# Patient Record
Sex: Female | Born: 1990
Health system: Southern US, Community
[De-identification: ages and names within clinical notes are randomized; demographics above are authoritative.]

## PROBLEM LIST (undated history)

## (undated) DIAGNOSIS — F1990 Other psychoactive substance use, unspecified, uncomplicated: Secondary | ICD-10-CM

## (undated) DIAGNOSIS — F1910 Other psychoactive substance abuse, uncomplicated: Secondary | ICD-10-CM

## (undated) DIAGNOSIS — Z978 Presence of other specified devices: Secondary | ICD-10-CM

## (undated) DIAGNOSIS — Z720 Tobacco use: Secondary | ICD-10-CM

## (undated) DIAGNOSIS — I509 Heart failure, unspecified: Secondary | ICD-10-CM

## (undated) DIAGNOSIS — I82409 Acute embolism and thrombosis of unspecified deep veins of unspecified lower extremity: Secondary | ICD-10-CM

## (undated) DIAGNOSIS — R0682 Tachypnea, not elsewhere classified: Secondary | ICD-10-CM

## (undated) DIAGNOSIS — I6310 Cerebral infarction due to embolism of unspecified precerebral artery: Secondary | ICD-10-CM

## (undated) DIAGNOSIS — I330 Acute and subacute infective endocarditis: Secondary | ICD-10-CM

## (undated) DIAGNOSIS — Z452 Encounter for adjustment and management of vascular access device: Secondary | ICD-10-CM

## (undated) HISTORY — DX: Cerebral infarction due to embolism of unspecified precerebral artery: I63.10

## (undated) HISTORY — DX: Acute and subacute infective endocarditis: I33.0

## (undated) HISTORY — DX: Acute embolism and thrombosis of unspecified deep veins of unspecified lower extremity: I82.409

## (undated) HISTORY — DX: Encounter for adjustment and management of vascular access device: Z45.2

## (undated) HISTORY — DX: Tachypnea, not elsewhere classified: R06.82

## (undated) HISTORY — DX: Presence of other specified devices: Z97.8

---

## 2019-04-01 ENCOUNTER — Inpatient Hospital Stay (HOSPITAL_COMMUNITY)
Admission: EM | Admit: 2019-04-01 | Discharge: 2019-06-09 | DRG: 907 | Disposition: A | Discharge status: Home / Self Care | Payer: Medicaid Other | Attending: Student | Admitting: Student

## 2019-04-01 ENCOUNTER — Emergency Department (HOSPITAL_COMMUNITY): Payer: Medicaid Other | Admitting: Certified Registered Nurse Anesthetist

## 2019-04-01 ENCOUNTER — Inpatient Hospital Stay (HOSPITAL_COMMUNITY): Payer: Medicaid Other

## 2019-04-01 ENCOUNTER — Emergency Department (HOSPITAL_COMMUNITY): Payer: Medicaid Other

## 2019-04-01 ENCOUNTER — Encounter (HOSPITAL_COMMUNITY)
Admission: EM | Disposition: A | Discharge status: Home / Self Care | Payer: Self-pay | Source: Home / Self Care | Attending: Cardiothoracic Surgery

## 2019-04-01 ENCOUNTER — Encounter (HOSPITAL_COMMUNITY): Payer: Self-pay

## 2019-04-01 DIAGNOSIS — I82411 Acute embolism and thrombosis of right femoral vein: Secondary | ICD-10-CM | POA: Diagnosis not present

## 2019-04-01 DIAGNOSIS — R29810 Facial weakness: Secondary | ICD-10-CM | POA: Diagnosis present

## 2019-04-01 DIAGNOSIS — I82409 Acute embolism and thrombosis of unspecified deep veins of unspecified lower extremity: Secondary | ICD-10-CM

## 2019-04-01 DIAGNOSIS — I671 Cerebral aneurysm, nonruptured: Secondary | ICD-10-CM | POA: Diagnosis present

## 2019-04-01 DIAGNOSIS — N280 Ischemia and infarction of kidney: Secondary | ICD-10-CM | POA: Diagnosis present

## 2019-04-01 DIAGNOSIS — F19239 Other psychoactive substance dependence with withdrawal, unspecified: Secondary | ICD-10-CM | POA: Diagnosis not present

## 2019-04-01 DIAGNOSIS — I339 Acute and subacute endocarditis, unspecified: Secondary | ICD-10-CM

## 2019-04-01 DIAGNOSIS — D62 Acute posthemorrhagic anemia: Secondary | ICD-10-CM | POA: Diagnosis not present

## 2019-04-01 DIAGNOSIS — I76 Septic arterial embolism: Secondary | ICD-10-CM | POA: Diagnosis present

## 2019-04-01 DIAGNOSIS — B259 Cytomegaloviral disease, unspecified: Secondary | ICD-10-CM | POA: Diagnosis not present

## 2019-04-01 DIAGNOSIS — Z95828 Presence of other vascular implants and grafts: Secondary | ICD-10-CM

## 2019-04-01 DIAGNOSIS — I63512 Cerebral infarction due to unspecified occlusion or stenosis of left middle cerebral artery: Secondary | ICD-10-CM

## 2019-04-01 DIAGNOSIS — Z9689 Presence of other specified functional implants: Secondary | ICD-10-CM

## 2019-04-01 DIAGNOSIS — G4700 Insomnia, unspecified: Secondary | ICD-10-CM | POA: Diagnosis not present

## 2019-04-01 DIAGNOSIS — E538 Deficiency of other specified B group vitamins: Secondary | ICD-10-CM | POA: Diagnosis present

## 2019-04-01 DIAGNOSIS — B1920 Unspecified viral hepatitis C without hepatic coma: Secondary | ICD-10-CM | POA: Diagnosis present

## 2019-04-01 DIAGNOSIS — I38 Endocarditis, valve unspecified: Secondary | ICD-10-CM | POA: Diagnosis present

## 2019-04-01 DIAGNOSIS — G8191 Hemiplegia, unspecified affecting right dominant side: Secondary | ICD-10-CM | POA: Diagnosis present

## 2019-04-01 DIAGNOSIS — R579 Shock, unspecified: Secondary | ICD-10-CM | POA: Diagnosis not present

## 2019-04-01 DIAGNOSIS — T401X1A Poisoning by heroin, accidental (unintentional), initial encounter: Secondary | ICD-10-CM | POA: Diagnosis present

## 2019-04-01 DIAGNOSIS — K5900 Constipation, unspecified: Secondary | ICD-10-CM | POA: Diagnosis not present

## 2019-04-01 DIAGNOSIS — Z978 Presence of other specified devices: Secondary | ICD-10-CM

## 2019-04-01 DIAGNOSIS — R0682 Tachypnea, not elsewhere classified: Secondary | ICD-10-CM

## 2019-04-01 DIAGNOSIS — F17210 Nicotine dependence, cigarettes, uncomplicated: Secondary | ICD-10-CM | POA: Diagnosis present

## 2019-04-01 DIAGNOSIS — E785 Hyperlipidemia, unspecified: Secondary | ICD-10-CM | POA: Diagnosis present

## 2019-04-01 DIAGNOSIS — I6602 Occlusion and stenosis of left middle cerebral artery: Secondary | ICD-10-CM

## 2019-04-01 DIAGNOSIS — R5381 Other malaise: Secondary | ICD-10-CM | POA: Diagnosis not present

## 2019-04-01 DIAGNOSIS — J9600 Acute respiratory failure, unspecified whether with hypoxia or hypercapnia: Secondary | ICD-10-CM

## 2019-04-01 DIAGNOSIS — E871 Hypo-osmolality and hyponatremia: Secondary | ICD-10-CM | POA: Diagnosis not present

## 2019-04-01 DIAGNOSIS — F1210 Cannabis abuse, uncomplicated: Secondary | ICD-10-CM | POA: Diagnosis present

## 2019-04-01 DIAGNOSIS — I63412 Cerebral infarction due to embolism of left middle cerebral artery: Secondary | ICD-10-CM | POA: Diagnosis present

## 2019-04-01 DIAGNOSIS — Z01810 Encounter for preprocedural cardiovascular examination: Secondary | ICD-10-CM

## 2019-04-01 DIAGNOSIS — J95821 Acute postprocedural respiratory failure: Secondary | ICD-10-CM | POA: Diagnosis not present

## 2019-04-01 DIAGNOSIS — I330 Acute and subacute infective endocarditis: Secondary | ICD-10-CM | POA: Diagnosis present

## 2019-04-01 DIAGNOSIS — J9601 Acute respiratory failure with hypoxia: Secondary | ICD-10-CM

## 2019-04-01 DIAGNOSIS — R471 Dysarthria and anarthria: Secondary | ICD-10-CM | POA: Diagnosis not present

## 2019-04-01 DIAGNOSIS — Z20822 Contact with and (suspected) exposure to covid-19: Secondary | ICD-10-CM | POA: Diagnosis present

## 2019-04-01 DIAGNOSIS — Z09 Encounter for follow-up examination after completed treatment for conditions other than malignant neoplasm: Secondary | ICD-10-CM

## 2019-04-01 DIAGNOSIS — R4701 Aphasia: Secondary | ICD-10-CM | POA: Diagnosis present

## 2019-04-01 DIAGNOSIS — I110 Hypertensive heart disease with heart failure: Secondary | ICD-10-CM | POA: Diagnosis present

## 2019-04-01 DIAGNOSIS — I6310 Cerebral infarction due to embolism of unspecified precerebral artery: Secondary | ICD-10-CM | POA: Diagnosis present

## 2019-04-01 DIAGNOSIS — Z952 Presence of prosthetic heart valve: Secondary | ICD-10-CM

## 2019-04-01 DIAGNOSIS — I5040 Unspecified combined systolic (congestive) and diastolic (congestive) heart failure: Secondary | ICD-10-CM | POA: Diagnosis present

## 2019-04-01 DIAGNOSIS — I97638 Postprocedural hematoma of a circulatory system organ or structure following other circulatory system procedure: Secondary | ICD-10-CM | POA: Diagnosis not present

## 2019-04-01 DIAGNOSIS — I5020 Unspecified systolic (congestive) heart failure: Secondary | ICD-10-CM

## 2019-04-01 DIAGNOSIS — I639 Cerebral infarction, unspecified: Secondary | ICD-10-CM

## 2019-04-01 DIAGNOSIS — R000 Tachycardia, unspecified: Secondary | ICD-10-CM | POA: Diagnosis not present

## 2019-04-01 DIAGNOSIS — R1310 Dysphagia, unspecified: Secondary | ICD-10-CM | POA: Diagnosis present

## 2019-04-01 DIAGNOSIS — Y838 Other surgical procedures as the cause of abnormal reaction of the patient, or of later complication, without mention of misadventure at the time of the procedure: Secondary | ICD-10-CM | POA: Diagnosis not present

## 2019-04-01 DIAGNOSIS — Z452 Encounter for adjustment and management of vascular access device: Secondary | ICD-10-CM

## 2019-04-01 DIAGNOSIS — J90 Pleural effusion, not elsewhere classified: Secondary | ICD-10-CM

## 2019-04-01 DIAGNOSIS — I7771 Dissection of carotid artery: Secondary | ICD-10-CM | POA: Diagnosis present

## 2019-04-01 DIAGNOSIS — F1910 Other psychoactive substance abuse, uncomplicated: Secondary | ICD-10-CM | POA: Diagnosis present

## 2019-04-01 DIAGNOSIS — E876 Hypokalemia: Secondary | ICD-10-CM | POA: Diagnosis not present

## 2019-04-01 DIAGNOSIS — I39 Endocarditis and heart valve disorders in diseases classified elsewhere: Secondary | ICD-10-CM

## 2019-04-01 DIAGNOSIS — F1990 Other psychoactive substance use, unspecified, uncomplicated: Secondary | ICD-10-CM | POA: Diagnosis present

## 2019-04-01 DIAGNOSIS — D649 Anemia, unspecified: Secondary | ICD-10-CM | POA: Diagnosis present

## 2019-04-01 DIAGNOSIS — R0600 Dyspnea, unspecified: Secondary | ICD-10-CM

## 2019-04-01 DIAGNOSIS — F410 Panic disorder [episodic paroxysmal anxiety] without agoraphobia: Secondary | ICD-10-CM | POA: Diagnosis present

## 2019-04-01 DIAGNOSIS — Z4659 Encounter for fitting and adjustment of other gastrointestinal appliance and device: Secondary | ICD-10-CM

## 2019-04-01 DIAGNOSIS — R7303 Prediabetes: Secondary | ICD-10-CM | POA: Diagnosis not present

## 2019-04-01 DIAGNOSIS — R29716 NIHSS score 16: Secondary | ICD-10-CM | POA: Diagnosis present

## 2019-04-01 DIAGNOSIS — R0602 Shortness of breath: Secondary | ICD-10-CM

## 2019-04-01 DIAGNOSIS — D735 Infarction of spleen: Secondary | ICD-10-CM | POA: Diagnosis present

## 2019-04-01 DIAGNOSIS — B379 Candidiasis, unspecified: Secondary | ICD-10-CM | POA: Diagnosis not present

## 2019-04-01 DIAGNOSIS — Z4682 Encounter for fitting and adjustment of non-vascular catheter: Secondary | ICD-10-CM

## 2019-04-01 HISTORY — PX: RADIOLOGY WITH ANESTHESIA: SHX6223

## 2019-04-01 HISTORY — DX: Occlusion and stenosis of left middle cerebral artery: I66.02

## 2019-04-01 HISTORY — DX: Other psychoactive substance use, unspecified, uncomplicated: F19.90

## 2019-04-01 HISTORY — DX: Tobacco use: Z72.0

## 2019-04-01 HISTORY — PX: IR CT HEAD LTD: IMG2386

## 2019-04-01 HISTORY — PX: IR INTRAVSC STENT CERV CAROTID W/O EMB-PROT MOD SED INC ANGIO: IMG2304

## 2019-04-01 HISTORY — DX: Other psychoactive substance abuse, uncomplicated: F19.10

## 2019-04-01 HISTORY — PX: IR PERCUTANEOUS ART THROMBECTOMY/INFUSION INTRACRANIAL INC DIAG ANGIO: IMG6087

## 2019-04-01 HISTORY — DX: Cerebral infarction due to unspecified occlusion or stenosis of left middle cerebral artery: I63.512

## 2019-04-01 LAB — PROTIME-INR
INR: 1.5 — ABNORMAL HIGH (ref 0.8–1.2)
Prothrombin Time: 17.9 seconds — ABNORMAL HIGH (ref 11.4–15.2)

## 2019-04-01 LAB — COMPREHENSIVE METABOLIC PANEL
ALT: 40 U/L (ref 0–44)
AST: 63 U/L — ABNORMAL HIGH (ref 15–41)
Albumin: 2.3 g/dL — ABNORMAL LOW (ref 3.5–5.0)
Alkaline Phosphatase: 112 U/L (ref 38–126)
Anion gap: 12 (ref 5–15)
BUN: 6 mg/dL (ref 6–20)
CO2: 24 mmol/L (ref 22–32)
Calcium: 8.5 mg/dL — ABNORMAL LOW (ref 8.9–10.3)
Chloride: 93 mmol/L — ABNORMAL LOW (ref 98–111)
Creatinine, Ser: 0.76 mg/dL (ref 0.44–1.00)
GFR calc Af Amer: >60 mL/min (ref >60)
GFR calc non Af Amer: >60 mL/min (ref >60)
Glucose, Bld: 118 mg/dL — ABNORMAL HIGH (ref 70–99)
Potassium: 4.5 mmol/L (ref 3.5–5.1)
Sodium: 129 mmol/L — ABNORMAL LOW (ref 135–145)
Total Bilirubin: 0.9 mg/dL (ref 0.3–1.2)
Total Protein: 7 g/dL (ref 6.5–8.1)

## 2019-04-01 LAB — I-STAT CHEM 8, ED
BUN: 9 mg/dL (ref 6–20)
Calcium, Ion: 1 mmol/L — ABNORMAL LOW (ref 1.15–1.40)
Chloride: 92 mmol/L — ABNORMAL LOW (ref 98–111)
Creatinine, Ser: 0.7 mg/dL (ref 0.44–1.00)
Glucose, Bld: 118 mg/dL — ABNORMAL HIGH (ref 70–99)
HCT: 37 % (ref 36.0–46.0)
Hemoglobin: 12.6 g/dL (ref 12.0–15.0)
Potassium: 4 mmol/L (ref 3.5–5.1)
Sodium: 127 mmol/L — ABNORMAL LOW (ref 135–145)
TCO2: 30 mmol/L (ref 22–32)

## 2019-04-01 LAB — APTT: aPTT: 31 seconds (ref 24–36)

## 2019-04-01 LAB — CBC
HCT: 35.3 % — ABNORMAL LOW (ref 36.0–46.0)
Hemoglobin: 11.9 g/dL — ABNORMAL LOW (ref 12.0–15.0)
MCH: 25.4 pg — ABNORMAL LOW (ref 26.0–34.0)
MCHC: 33.7 g/dL (ref 30.0–36.0)
MCV: 75.4 fL — ABNORMAL LOW (ref 80.0–100.0)
Platelets: 285 10*3/uL (ref 150–400)
RBC: 4.68 MIL/uL (ref 3.87–5.11)
RDW: 14 % (ref 11.5–15.5)
WBC: 19.5 10*3/uL — ABNORMAL HIGH (ref 4.0–10.5)
nRBC: 0 % (ref 0.0–0.2)

## 2019-04-01 LAB — DIFFERENTIAL
Abs Immature Granulocytes: 0.33 10*3/uL — ABNORMAL HIGH (ref 0.00–0.07)
Basophils Absolute: 0.1 10*3/uL (ref 0.0–0.1)
Basophils Relative: 0 %
Eosinophils Absolute: 0 10*3/uL (ref 0.0–0.5)
Eosinophils Relative: 0 %
Immature Granulocytes: 2 %
Lymphocytes Relative: 5 %
Lymphs Abs: 1 10*3/uL (ref 0.7–4.0)
Monocytes Absolute: 1.1 10*3/uL — ABNORMAL HIGH (ref 0.1–1.0)
Monocytes Relative: 5 %
Neutro Abs: 17 10*3/uL — ABNORMAL HIGH (ref 1.7–7.7)
Neutrophils Relative %: 88 %

## 2019-04-01 LAB — I-STAT BETA HCG BLOOD, ED (MC, WL, AP ONLY): I-stat hCG, quantitative: 7.4 m[IU]/mL — ABNORMAL HIGH (ref <5)

## 2019-04-01 LAB — RESPIRATORY PANEL BY RT PCR (FLU A&B, COVID)
Influenza A by PCR: NEGATIVE
Influenza B by PCR: NEGATIVE
SARS Coronavirus 2 by RT PCR: NEGATIVE

## 2019-04-01 LAB — CBG MONITORING, ED: Glucose-Capillary: 124 mg/dL — ABNORMAL HIGH (ref 70–99)

## 2019-04-01 SURGERY — IR WITH ANESTHESIA
Anesthesia: General

## 2019-04-01 MED ORDER — EPTIFIBATIDE 20 MG/10ML IV SOLN
INTRAVENOUS | Status: AC
  Filled 2019-04-01: qty 10

## 2019-04-01 MED ORDER — ASPIRIN 81 MG PO CHEW
81.0000 mg | CHEWABLE_TABLET | Freq: Every day | ORAL | Status: DC
  Administered 2019-04-02 – 2019-04-04 (×3): 81 mg
  Filled 2019-04-01 (×4): qty 1

## 2019-04-01 MED ORDER — SODIUM CHLORIDE 0.9% FLUSH
3.0000 mL | Freq: Once | INTRAVENOUS | Status: DC
Start: 2019-04-01 — End: 2019-04-10

## 2019-04-01 MED ORDER — ASPIRIN 81 MG PO CHEW
CHEWABLE_TABLET | ORAL | Status: AC
  Filled 2019-04-01: qty 1

## 2019-04-01 MED ORDER — SODIUM CHLORIDE 0.9 % IV SOLN: INTRAVENOUS | Status: DC

## 2019-04-01 MED ORDER — ATORVASTATIN CALCIUM 80 MG PO TABS: 80.0000 mg | ORAL_TABLET | Freq: Every day | ORAL | Status: DC

## 2019-04-01 MED ORDER — SUCCINYLCHOLINE CHLORIDE 200 MG/10ML IV SOSY
PREFILLED_SYRINGE | INTRAVENOUS | Status: DC | PRN
  Administered 2019-04-01: 120 mg via INTRAVENOUS

## 2019-04-01 MED ORDER — FENTANYL CITRATE (PF) 100 MCG/2ML IJ SOLN
INTRAMUSCULAR | Status: AC
  Filled 2019-04-01: qty 2

## 2019-04-01 MED ORDER — VERAPAMIL HCL 2.5 MG/ML IV SOLN
INTRAVENOUS | Status: AC | PRN
  Administered 2019-04-01: 2.5 mg via INTRA_ARTERIAL

## 2019-04-01 MED ORDER — STROKE: EARLY STAGES OF RECOVERY BOOK
Freq: Once | Status: AC
  Filled 2019-04-01: qty 1

## 2019-04-01 MED ORDER — ASPIRIN 81 MG PO CHEW
CHEWABLE_TABLET | ORAL | Status: AC | PRN
  Administered 2019-04-01: 81 mg

## 2019-04-01 MED ORDER — TICAGRELOR 90 MG PO TABS
ORAL_TABLET | ORAL | Status: AC
  Filled 2019-04-01: qty 2

## 2019-04-01 MED ORDER — ACETAMINOPHEN 325 MG PO TABS
650.0000 mg | ORAL_TABLET | ORAL | Status: DC | PRN
  Administered 2019-04-08 – 2019-04-19 (×5): 650 mg via ORAL
  Filled 2019-04-01 (×5): qty 2

## 2019-04-01 MED ORDER — ROCURONIUM BROMIDE 50 MG/5ML IV SOSY
PREFILLED_SYRINGE | INTRAVENOUS | Status: DC | PRN
  Administered 2019-04-01: 70 mg via INTRAVENOUS

## 2019-04-01 MED ORDER — ACETAMINOPHEN 325 MG PO TABS: 650.0000 mg | ORAL_TABLET | ORAL | Status: DC | PRN

## 2019-04-01 MED ORDER — PROPOFOL 10 MG/ML IV BOLUS
INTRAVENOUS | Status: DC | PRN
  Administered 2019-04-01: 200 mg via INTRAVENOUS

## 2019-04-01 MED ORDER — CLEVIDIPINE BUTYRATE 0.5 MG/ML IV EMUL
0.0000 mg/h | INTRAVENOUS | Status: DC
  Administered 2019-04-02: 2 mg/h via INTRAVENOUS
  Filled 2019-04-01: qty 50

## 2019-04-01 MED ORDER — NITROGLYCERIN 1 MG/10 ML FOR IR/CATH LAB
INTRA_ARTERIAL | Status: AC | PRN
  Administered 2019-04-01 (×2): 25 ug via INTRA_ARTERIAL

## 2019-04-01 MED ORDER — SENNOSIDES-DOCUSATE SODIUM 8.6-50 MG PO TABS: 1.0000 | ORAL_TABLET | Freq: Every evening | ORAL | Status: DC | PRN

## 2019-04-01 MED ORDER — ACETAMINOPHEN 650 MG RE SUPP: 650.0000 mg | RECTAL | Status: DC | PRN

## 2019-04-01 MED ORDER — TICAGRELOR 60 MG PO TABS
ORAL_TABLET | ORAL | Status: AC | PRN
  Administered 2019-04-01: 180 mg

## 2019-04-01 MED ORDER — TICAGRELOR 90 MG PO TABS
90.0000 mg | ORAL_TABLET | Freq: Two times a day (BID) | ORAL | Status: DC
  Administered 2019-04-02 – 2019-04-03 (×4): 90 mg
  Filled 2019-04-01 (×5): qty 1

## 2019-04-01 MED ORDER — ACETAMINOPHEN 160 MG/5ML PO SOLN
650.0000 mg | ORAL | Status: DC | PRN
  Filled 2019-04-01: qty 20.3

## 2019-04-01 MED ORDER — ONDANSETRON HCL 4 MG/2ML IJ SOLN
4.0000 mg | Freq: Four times a day (QID) | INTRAMUSCULAR | Status: DC | PRN
  Administered 2019-04-16: 4 mg via INTRAVENOUS
  Filled 2019-04-01: qty 2

## 2019-04-01 MED ORDER — ACETAMINOPHEN 160 MG/5ML PO SOLN: 650.0000 mg | ORAL | Status: DC | PRN

## 2019-04-01 MED ORDER — PHENYLEPHRINE HCL-NACL 10-0.9 MG/250ML-% IV SOLN
INTRAVENOUS | Status: DC | PRN
  Administered 2019-04-01: 40 ug/min via INTRAVENOUS

## 2019-04-01 MED ORDER — CEFAZOLIN SODIUM-DEXTROSE 2-4 GM/100ML-% IV SOLN
INTRAVENOUS | Status: AC
  Filled 2019-04-01: qty 100

## 2019-04-01 MED ORDER — CEFAZOLIN SODIUM-DEXTROSE 2-3 GM-%(50ML) IV SOLR
INTRAVENOUS | Status: DC | PRN
  Administered 2019-04-01: 2 g via INTRAVENOUS

## 2019-04-01 MED ORDER — IOHEXOL 300 MG/ML  SOLN
150.0000 mL | Freq: Once | INTRAMUSCULAR | Status: AC | PRN
  Administered 2019-04-01: 60 mL via INTRA_ARTERIAL

## 2019-04-01 MED ORDER — NITROGLYCERIN 1 MG/10 ML FOR IR/CATH LAB
INTRA_ARTERIAL | Status: AC
  Filled 2019-04-01: qty 10

## 2019-04-01 MED ORDER — TICAGRELOR 90 MG PO TABS: 90.0000 mg | ORAL_TABLET | Freq: Two times a day (BID) | ORAL | Status: DC

## 2019-04-01 MED ORDER — EPTIFIBATIDE 20 MG/10ML IV SOLN
INTRAVENOUS | Status: AC | PRN
  Administered 2019-04-01 (×3): 1.5 mg via INTRAVENOUS

## 2019-04-01 MED ORDER — ACETAMINOPHEN 650 MG RE SUPP
650.0000 mg | RECTAL | Status: DC | PRN
  Administered 2019-04-02: 650 mg via RECTAL
  Filled 2019-04-01: qty 1

## 2019-04-01 MED ORDER — LIDOCAINE 2% (20 MG/ML) 5 ML SYRINGE
INTRAMUSCULAR | Status: DC | PRN
  Administered 2019-04-01: 60 mg via INTRAVENOUS

## 2019-04-01 MED ORDER — VERAPAMIL HCL 2.5 MG/ML IV SOLN
INTRAVENOUS | Status: AC
  Filled 2019-04-01: qty 2

## 2019-04-01 MED ORDER — ASPIRIN 81 MG PO CHEW
81.0000 mg | CHEWABLE_TABLET | Freq: Every day | ORAL | Status: DC
  Administered 2019-04-06 – 2019-04-27 (×22): 81 mg via ORAL
  Filled 2019-04-01 (×22): qty 1

## 2019-04-01 MED ORDER — IOHEXOL 350 MG/ML SOLN
100.0000 mL | Freq: Once | INTRAVENOUS | Status: AC | PRN
  Administered 2019-04-01: 100 mL via INTRAVENOUS

## 2019-04-01 MED ORDER — IOHEXOL 300 MG/ML  SOLN
50.0000 mL | Freq: Once | INTRAMUSCULAR | Status: AC | PRN
  Administered 2019-04-01: 30 mL via INTRA_ARTERIAL

## 2019-04-01 MED ORDER — FENTANYL CITRATE (PF) 250 MCG/5ML IJ SOLN
INTRAMUSCULAR | Status: DC | PRN
  Administered 2019-04-01 (×2): 100 ug via INTRAVENOUS

## 2019-04-01 NOTE — Anesthesia Preprocedure Evaluation (Signed)
Anesthesia Evaluation  Patient identified by MRN, date of birth, ID band Patient confused    Reviewed: Unable to perform ROS - Chart review onlyPreop documentation limited or incomplete due to emergent nature of procedure.  Airway Mallampati: II  TM Distance: >3 FB Neck ROM: Full    Dental  (+) Teeth Intact   Pulmonary    breath sounds clear to auscultation       Cardiovascular  Rhythm:Regular Rate:Tachycardia     Neuro/Psych    GI/Hepatic   Endo/Other    Renal/GU      Musculoskeletal   Abdominal   Peds  Hematology   Anesthesia Other Findings   Reproductive/Obstetrics                             Anesthesia Physical Anesthesia Plan  ASA: IV and emergent  Anesthesia Plan: General   Post-op Pain Management:    Induction: Intravenous  PONV Risk Score and Plan: Ondansetron  Airway Management Planned: Oral ETT  Additional Equipment:   Intra-op Plan:   Post-operative Plan: Possible Post-op intubation/ventilation  Informed Consent:   Plan Discussed with: CRNA and Anesthesiologist  Anesthesia Plan Comments:         Anesthesia Quick Evaluation

## 2019-04-01 NOTE — Progress Notes (Signed)
eLink Physician-Brief Progress Note Patient Name: Lauren Reyes DOB: 1990-11-17 MRN: [id number]   Date of Service  04/01/2019  HPI/Events of Note  Pt  Admitted to the ICU s/p revascularization procedure by Dr. Corliss Skains for left MCA M3 occlusion, past medical history is unknown, she has elevated WBC count and incidental findings suspicious for  septic emboli to the lungs, this will require work up. Beta hCG slightly elevated r/o early pregnancy. Pt is on the ventilator s/p procedure.  eICU Interventions  New Patient Evaluation completed. PCCM Bedside to see in consultation.        Migdalia Dk 04/01/2019, 11:41 PM

## 2019-04-01 NOTE — H&P (Signed)
Chief Complaint: Aphasia, right side weakness  History obtained from: Patient and Chart   HPI:                                                                                                                                       Lauren Reyes is a 29 y.o. female with unknown past medical history, possible heroin abuse presents to the emergency department as a code stroke with aphasia and right-sided weakness.  Last known normal was around 6 PM, although initially there was some concern of confusion prior to that. PD called to the hotel room where she was staying as she had overstated her visit, patient was talking on the phone and suddenly started to slur her speech, the right facial droop and had difficulty talking.  When EMS arrived patient still communicating but slowly progressively got worse.    [facility], patient had left gaze preference, aphasic right-sided weakness.  NIH stroke scale was 16.  Stat CT head was obtained which showed early ischemic changes in the left MCA territory with aspects of 8, possibly 9/10.  CT angiogram showed a M3 occlusion and CT perfusion showed a 30 cc penumbra with 4 cc core.  CTA also showed multiple bilateral upper lung nodules possibly septic emboli.  Therefore decision to administer TPA was deferred.  Regarding confusion as to clear last known normal, per hotel staff patient was acting" lucid"-possibly may be she was hypomanic and and was loudd and stating that her family is very wealthy "unable.  [facility]  Patient was taken to IR for thrombectomy after obtaining 2 person emergency consent after discussion with Dr. Titus Dubinevashwar, as there is no family contact.  PD still searching for family.  Date last known well: 3.25.21 Time last known well: 6pm tPA Given: no, concern for septic emboli  NIHSS: 16 Baseline MRS 0  History reviewed. No pertinent past medical history.  History reviewed. No  pertinent surgical history.  No family history on file.  Unable to obtain Social History:  has no history on file for tobacco, alcohol, and drug.  Allergies: Not on File  Medications:                                                                                                                        I reviewed home medications  ROS:                                                                                                                                     Limited due to patient mental status   Examination:                                                                                                      General: Appears well-developed  Psych: Affect appropriate to situation Eyes: No scleral injection HENT: No OP obstrucion Head: Normocephalic.  Cardiovascular: Normal rate and regular rhythm.  No obvious murmurs Respiratory: Effort normal and breath sounds normal to anterior ascultation GI: Soft.  No distension. There is no tenderness.  Skin: WDI    Neurological Examination Mental Status:  Cranial Nerves: II: Visual fields grossly normal,  III,IV, VI: ptosis not present, extra-ocular motions intact bilaterally, pupils equal, round, reactive to light and accommodation V,VII: smile symmetric, facial light touch sensation normal bilaterally VIII: hearing normal bilaterally IX,X: uvula rises symmetrically XI: bilateral shoulder shrug XII: midline tongue extension Motor: Right : Upper extremity   5/5    Left:     Upper extremity   5/5  Lower extremity   5/5     Lower extremity   5/5 Tone and bulk:normal tone throughout; no atrophy noted Sensory: Pinprick and light touch intact throughout, bilaterally Deep Tendon Reflexes: 2+ and symmetric throughout Plantars: Right: downgoing   Left: downgoing Cerebellar: normal finger-to-nose, normal rapid alternating movements and normal heel-to-shin test Gait: normal gait and station     Lab Results: Basic Metabolic  Panel: Recent Labs  Lab 04/01/19 1943 04/01/19 2003  NA 129* 127*  K 4.5 4.0  CL 93* 92*  CO2 24  --   GLUCOSE 118* 118*  BUN 6 9  CREATININE 0.76 0.70  CALCIUM 8.5*  --     CBC: Recent Labs  Lab 04/01/19 1943 04/01/19 2003  WBC 19.5*  --   NEUTROABS 17.0*  --   HGB 11.9* 12.6  HCT 35.3* 37.0  MCV 75.4*  --   PLT 285  --     Coagulation Studies: Recent Labs    04/01/19 1943  LABPROT 17.9*  INR 1.5*    Imaging: CT ANGIO HEAD W OR WO CONTRAST  Result Date: 04/01/2019 CLINICAL DATA:  29 year old female code stroke presentation with suspicion of left side LVO. EXAM: CT ANGIOGRAPHY HEAD AND NECK CT PERFUSION BRAIN TECHNIQUE: Multidetector CT imaging of the head and neck was performed  using the standard protocol during bolus administration of intravenous contrast. Multiplanar CT image reconstructions and MIPs were obtained to evaluate the vascular anatomy. Carotid stenosis measurements (when applicable) are obtained utilizing NASCET criteria, using the distal internal carotid diameter as the denominator. Multiphase CT imaging of the brain was performed following IV bolus contrast injection. Subsequent parametric perfusion maps were calculated using RAPID software. CONTRAST:  100 mL Omnipaque 350. COMPARISON:  Plain head CT 1952 hours today. FINDINGS: CT Brain Perfusion Findings: ASPECTS: 8. CBF (<30%) Volume: 39mL Perfusion (Tmax>6.0s) volume: 21mL (hypoperfusion index 0.2). Mismatch Volume: 14mL Infarction Location:Left MCA operculum and subjacent white matter. CTA NECK Skeleton: Occasional dental caries on the right. No acute osseous abnormality identified. Upper chest: There are scattered bilateral lung nodules in the upper lobes and visible superior segment lower lobes. Some of these demonstrate early cavitation (series 1, image 140. The largest visible area of involvement is at least 2 cm in the superior segment of the right lower lobe. No pleural effusion. No superior  mediastinal lymphadenopathy. Visible central pulmonary arteries are patent. Other neck: Negative. Aortic arch: 3 vessel arch configuration with no arch atherosclerosis. Incidental small venous collaterals are enhancing in the superior mediastinum prevascular space. Grossly normal visible SVC. Right carotid system: Negative. Left carotid system: Negative. Vertebral arteries: Normal proximal right subclavian artery and right vertebral artery origin. Patent and normal right vertebral to the skull base. Normal proximal left subclavian artery and left vertebral artery origin. Patent and normal left vertebral artery to the skull base. CTA HEAD Posterior circulation: Patent and normal vertebral artery V4 segments. Patent vertebrobasilar junction and basilar artery without stenosis. Patent SCA and PCA origins. Posterior communicating arteries are diminutive or absent. Bilateral PCA branches are within normal limits. Anterior circulation: Both ICA siphons are patent. No siphon plaque or stenosis. Patent carotid termini, MCA and ACA origins. Anterior communicating artery and bilateral ACA branches are within normal limits. Right MCA M1 segment and bifurcation are patent without stenosis. Right MCA branches appear within normal limits. The left MCA M1 segment and bifurcation are patent. There is of very proximal left M3 branch occlusion identified on series 10, image 30 and on axial series 5 images 102-100. This is at the level of the mid insula. No other left MCA branch occlusion is identified. Venous sinuses: Patent, the right transverse and sigmoid sinuses appear dominant. Anatomic variants: None. Review of the MIP images confirms the above findings IMPRESSION: 1. Positive for a Left MCA proximal M3 brandch occlusion as seen on CTA series 10, image 30. 2. Positive also for multiple bilateral upper lung nodules, some with early cavitation. Consider Septic Emboli. 3. The above was discussed by telephone with Dr. Arther Dames on 04/01/2019 at 2007 hours. 4. CTP detects a very small 4 mL core infarct with 40 mL of Left MCA territory penumbra. 5. No atherosclerosis or large vessel abnormality identified. Electronically Signed   By: Odessa Fleming M.D.   On: 04/01/2019 20:20   CT ANGIO NECK W OR WO CONTRAST  Result Date: 04/01/2019 CLINICAL DATA:  29 year old female code stroke presentation with suspicion of left side LVO. EXAM: CT ANGIOGRAPHY HEAD AND NECK CT PERFUSION BRAIN TECHNIQUE: Multidetector CT imaging of the head and neck was performed using the standard protocol during bolus administration of intravenous contrast. Multiplanar CT image reconstructions and MIPs were obtained to evaluate the vascular anatomy. Carotid stenosis measurements (when applicable) are obtained utilizing NASCET criteria, using the distal internal carotid diameter as the denominator. Multiphase CT  imaging of the brain was performed following IV bolus contrast injection. Subsequent parametric perfusion maps were calculated using RAPID software. CONTRAST:  100 mL Omnipaque 350. COMPARISON:  Plain head CT 1952 hours today. FINDINGS: CT Brain Perfusion Findings: ASPECTS: 8. CBF (<30%) Volume: 63mL Perfusion (Tmax>6.0s) volume: 14mL (hypoperfusion index 0.2). Mismatch Volume: 8mL Infarction Location:Left MCA operculum and subjacent white matter. CTA NECK Skeleton: Occasional dental caries on the right. No acute osseous abnormality identified. Upper chest: There are scattered bilateral lung nodules in the upper lobes and visible superior segment lower lobes. Some of these demonstrate early cavitation (series 1, image 140. The largest visible area of involvement is at least 2 cm in the superior segment of the right lower lobe. No pleural effusion. No superior mediastinal lymphadenopathy. Visible central pulmonary arteries are patent. Other neck: Negative. Aortic arch: 3 vessel arch configuration with no arch atherosclerosis. Incidental small venous collaterals are  enhancing in the superior mediastinum prevascular space. Grossly normal visible SVC. Right carotid system: Negative. Left carotid system: Negative. Vertebral arteries: Normal proximal right subclavian artery and right vertebral artery origin. Patent and normal right vertebral to the skull base. Normal proximal left subclavian artery and left vertebral artery origin. Patent and normal left vertebral artery to the skull base. CTA HEAD Posterior circulation: Patent and normal vertebral artery V4 segments. Patent vertebrobasilar junction and basilar artery without stenosis. Patent SCA and PCA origins. Posterior communicating arteries are diminutive or absent. Bilateral PCA branches are within normal limits. Anterior circulation: Both ICA siphons are patent. No siphon plaque or stenosis. Patent carotid termini, MCA and ACA origins. Anterior communicating artery and bilateral ACA branches are within normal limits. Right MCA M1 segment and bifurcation are patent without stenosis. Right MCA branches appear within normal limits. The left MCA M1 segment and bifurcation are patent. There is of very proximal left M3 branch occlusion identified on series 10, image 30 and on axial series 5 images 102-100. This is at the level of the mid insula. No other left MCA branch occlusion is identified. Venous sinuses: Patent, the right transverse and sigmoid sinuses appear dominant. Anatomic variants: None. Review of the MIP images confirms the above findings IMPRESSION: 1. Positive for a Left MCA proximal M3 brandch occlusion as seen on CTA series 10, image 30. 2. Positive also for multiple bilateral upper lung nodules, some with early cavitation. Consider Septic Emboli. 3. The above was discussed by telephone with Dr. Arther Dames on 04/01/2019 at 2007 hours. 4. CTP detects a very small 4 mL core infarct with 40 mL of Left MCA territory penumbra. 5. No atherosclerosis or large vessel abnormality identified. Electronically Signed   By:  Odessa Fleming M.D.   On: 04/01/2019 20:20   CT CEREBRAL PERFUSION W CONTRAST  Result Date: 04/01/2019 CLINICAL DATA:  29 year old female code stroke presentation with suspicion of left side LVO. EXAM: CT ANGIOGRAPHY HEAD AND NECK CT PERFUSION BRAIN TECHNIQUE: Multidetector CT imaging of the head and neck was performed using the standard protocol during bolus administration of intravenous contrast. Multiplanar CT image reconstructions and MIPs were obtained to evaluate the vascular anatomy. Carotid stenosis measurements (when applicable) are obtained utilizing NASCET criteria, using the distal internal carotid diameter as the denominator. Multiphase CT imaging of the brain was performed following IV bolus contrast injection. Subsequent parametric perfusion maps were calculated using RAPID software. CONTRAST:  100 mL Omnipaque 350. COMPARISON:  Plain head CT 1952 hours today. FINDINGS: CT Brain Perfusion Findings: ASPECTS: 8. CBF (<30%) Volume: 59mL Perfusion (  Tmax>6.0s) volume: 2mL (hypoperfusion index 0.2). Mismatch Volume: 10mL Infarction Location:Left MCA operculum and subjacent white matter. CTA NECK Skeleton: Occasional dental caries on the right. No acute osseous abnormality identified. Upper chest: There are scattered bilateral lung nodules in the upper lobes and visible superior segment lower lobes. Some of these demonstrate early cavitation (series 1, image 140. The largest visible area of involvement is at least 2 cm in the superior segment of the right lower lobe. No pleural effusion. No superior mediastinal lymphadenopathy. Visible central pulmonary arteries are patent. Other neck: Negative. Aortic arch: 3 vessel arch configuration with no arch atherosclerosis. Incidental small venous collaterals are enhancing in the superior mediastinum prevascular space. Grossly normal visible SVC. Right carotid system: Negative. Left carotid system: Negative. Vertebral arteries: Normal proximal right subclavian artery  and right vertebral artery origin. Patent and normal right vertebral to the skull base. Normal proximal left subclavian artery and left vertebral artery origin. Patent and normal left vertebral artery to the skull base. CTA HEAD Posterior circulation: Patent and normal vertebral artery V4 segments. Patent vertebrobasilar junction and basilar artery without stenosis. Patent SCA and PCA origins. Posterior communicating arteries are diminutive or absent. Bilateral PCA branches are within normal limits. Anterior circulation: Both ICA siphons are patent. No siphon plaque or stenosis. Patent carotid termini, MCA and ACA origins. Anterior communicating artery and bilateral ACA branches are within normal limits. Right MCA M1 segment and bifurcation are patent without stenosis. Right MCA branches appear within normal limits. The left MCA M1 segment and bifurcation are patent. There is of very proximal left M3 branch occlusion identified on series 10, image 30 and on axial series 5 images 102-100. This is at the level of the mid insula. No other left MCA branch occlusion is identified. Venous sinuses: Patent, the right transverse and sigmoid sinuses appear dominant. Anatomic variants: None. Review of the MIP images confirms the above findings IMPRESSION: 1. Positive for a Left MCA proximal M3 brandch occlusion as seen on CTA series 10, image 30. 2. Positive also for multiple bilateral upper lung nodules, some with early cavitation. Consider Septic Emboli. 3. The above was discussed by telephone with Dr. Arther Dames on 04/01/2019 at 2007 hours. 4. CTP detects a very small 4 mL core infarct with 40 mL of Left MCA territory penumbra. 5. No atherosclerosis or large vessel abnormality identified. Electronically Signed   By: Odessa Fleming M.D.   On: 04/01/2019 20:20   CT HEAD CODE STROKE WO CONTRAST  Result Date: 04/01/2019 CLINICAL DATA:  Code stroke. 29 year old female with right facial droop and aphasia. EXAM: CT HEAD WITHOUT  CONTRAST TECHNIQUE: Contiguous axial images were obtained from the base of the skull through the vertex without intravenous contrast. COMPARISON:  None. FINDINGS: Brain: Normal cerebral volume. No acute intracranial hemorrhage identified. No midline shift, mass effect, or evidence of intracranial mass lesion. No ventriculomegaly. Asymmetric hypodensity at the left insula on series 2, image 15. But no other cytotoxic edema identified in the left hemisphere. There is perhaps subtle asymmetric white matter hypodensity such as in the left middle frontal gyrus on coronal image 33. Normal gray-white matter differentiation in the right hemisphere and the posterior fossa. Vascular: No suspicious intracranial vascular hyperdensity. Skull: Negative. Sinuses/Orbits: Visualized paranasal sinuses and mastoids are clear. Other: Leftward gaze. Visualized scalp soft tissues are within normal limits. ASPECTS [facility] Stroke Program Early CT Score) - Ganglionic level infarction (caudate, lentiform nuclei, internal capsule, insula, M1-M3 cortex): 6 - Supraganglionic infarction (M4-M6 cortex): 2 Total  score (0-10 with 10 being normal): 8 IMPRESSION: 1. Leftward gaze deviation with mild hypodensity in the left insula, and also perhaps involving some white matter in the middle frontal gyrus. ASPECTS 8. No associated hemorrhage or mass effect. 2. These results were communicated to Dr. Lorraine Lax at 8:01 pm on 04/01/2019 by text page via the [facility] messaging system. Electronically Signed   By: Genevie Ann M.D.   On: 04/01/2019 20:04     ASSESSMENT AND PLAN   28 y.o. female with unknown past medical history, possible heroin abuse presents to the emergency department as a code stroke with aphasia and right-sided weakness - suspect cardioembolic, possible IVDU. UDS +ve amphetamines and THC. Blood cx ordered -pending. tPA not administered as there was concern for septic emboli in lungs on CTA, patient taken for IR due to M3 occlusion with 4cc core  and 36cc pennumbra.   #Left MCA acute ischemic stroke 2/2 M3 occlusion s/p  EMT with TICI 3 recanalization complicated by dissection/pseudoanerusym of left ICA s/p flow diverter and petechial hemorrhage.   # Admit to Neuro ICU # MRI of the brain without contrast #Transthoracic Echo/TEE to lok for endocarditis # Antiplatelet per IR for flow diverter #Blood cx to look for bacteremia, hypercoagulable workup ordered  #Start or continue Atorvastatin 40 mg/other high intensity statin # BP goal: 120-1# HBAIC and Lipid profile # Telemetry monitoring # Frequent neuro checks # NPO until passes stroke swallow screen  #acute respiratory failure - appreciate PCCM assitance with ventilator management   # mildly elevated B HCG - repeat negative for pregnancy   Code status: full code Goals of care: unable to identify/locate family     Please page stroke NP  Or  PA  Or MD from 8am -4 pm  as this patient from this time will be  followed by the stroke.   You can look them up on [email or web address]  Password [email or web address]   This patient is neurologically critically ill due to stroke s/p MT.  He is at risk for significant risk of neurological worsening from cerebral edema,  death from brain herniation, heart failure, hemorrhagic conversion, infection, respiratory failure and seizure. This patient's care requires constant monitoring of vital signs, hemodynamics, respiratory and cardiac monitoring, review of multiple databases, neurological assessment, discussion with family, other specialists and medical decision making of high complexity.  I spent 65 minutes of neurocritical time in the care of this patient.      Lauren Reyes [facility] Pager Number [phone]

## 2019-04-01 NOTE — Transfer of Care (Signed)
Immediate Anesthesia Transfer of Care Note  Patient: Brandey Wanjiku Lizardi  Procedure(s) Performed: IR WITH ANESTHESIA (N/A )  Patient Location: ICU  Anesthesia Type:General  Level of Consciousness: Patient remains intubated per anesthesia plan  Airway & Oxygen Therapy: Patient remains intubated per anesthesia plan and Patient placed on Ventilator (see vital sign flow sheet for setting)  Post-op Assessment: Report given to RN and Post -op Vital signs reviewed and stable  Post vital signs: Reviewed and stable  Last Vitals:  Vitals Value Taken Time  BP    Temp    Pulse 122 04/01/19 2328  Resp 14 04/01/19 2330  SpO2 100 % 04/01/19 2328  Vitals shown include unvalidated device data.  Last Pain:  Vitals:   04/01/19 2038  PainSc: 0-No pain         Complications: No apparent anesthesia complications

## 2019-04-01 NOTE — Sedation Documentation (Signed)
Spoke with [place] in pt placement. Pt have clean and ready bed on 4N pending covid result.

## 2019-04-01 NOTE — ED Provider Notes (Signed)
[facility] Provider Note   CSN: [id number] Arrival date & time: 04/01/19  0998  An emergency department physician performed an initial assessment on this suspected stroke patient at 48.  History Chief Complaint  Patient presents with   Code Stroke    Lauren Reyes is a 29 y.o. female.  HPI Level 5 caveat due to altered mental status. Patient brought in by EMS.  Reportedly was staying at a hotel and was post be leaving today.  Was trying to get her stuff out and police had to be called.  Reportedly was potentially little confused earlier in the day but sometimes after 3:38 talk to the police developed right-sided facial droop and more difficulty speaking.  Per EMS had receptive and expressive aphasia.  Weakness on right side also.  Patient is able to tell me her name but otherwise difficulty getting any history from her.  Afebrile.  Sugar was good.  And mild hypertension.    History reviewed. No pertinent past medical history.  Patient Active Problem List   Diagnosis Date Noted   Acute ischemic left MCA stroke ([place]) 04/01/2019   Middle cerebral artery embolism, left 04/01/2019      OB History   No obstetric history on file.     No family history on file.  Social History   Tobacco Use   Smoking status: Not on file  Substance Use Topics   Alcohol use: Not on file   Drug use: Not on file    Home Medications Prior to Admission medications   Not on File    Allergies    Patient has no allergy information on record.  Review of Systems   Review of Systems  Unable to perform ROS: Mental status change    Physical Exam Updated Vital Signs BP 114/79 (BP Location: Left Arm)    Pulse (!) 125    Temp 98.2 F (36.8 C) (Axillary)    Resp 16    Ht 5\' 5"  (1.651 m)    Wt 72.3 kg    SpO2 100%    BMI 26.52 kg/m   Physical Exam Vitals reviewed.  HENT:     Head:     Comments: Right-sided facial droop. Eyes:   Comments: Right-sided neglect.  Will cross midline to the right but rarely.  Cardiovascular:     Rate and Rhythm: Regular rhythm.  Pulmonary:     Breath sounds: No wheezing or rhonchi.  Abdominal:     Tenderness: There is no abdominal tenderness.  Musculoskeletal:        General: No tenderness.     Cervical back: Neck supple.  Skin:    Capillary Refill: Capillary refill takes less than 2 seconds.     Coloration: Skin is not jaundiced.  Neurological:     Mental Status: She is alert.     Comments: Awake to self.  Follows some commands but still confused.  Unable to identify a glove.  Right-sided facial droop.  Weak on the right compared to left.     ED Results / Procedures / Treatments   Labs (all labs ordered are listed, but only abnormal results are displayed) Labs Reviewed  PROTIME-INR - Abnormal; Notable for the following components:      Result Value   Prothrombin Time 17.9 (*)    INR 1.5 (*)    All other components within normal limits  CBC - Abnormal; Notable for the following components:   WBC 19.5 (*)  Hemoglobin 11.9 (*)    HCT 35.3 (*)    MCV 75.4 (*)    MCH 25.4 (*)    All other components within normal limits  DIFFERENTIAL - Abnormal; Notable for the following components:   Neutro Abs 17.0 (*)    Monocytes Absolute 1.1 (*)    Abs Immature Granulocytes 0.33 (*)    All other components within normal limits  COMPREHENSIVE METABOLIC PANEL - Abnormal; Notable for the following components:   Sodium 129 (*)    Chloride 93 (*)    Glucose, Bld 118 (*)    Calcium 8.5 (*)    Albumin 2.3 (*)    AST 63 (*)    All other components within normal limits  I-STAT CHEM 8, ED - Abnormal; Notable for the following components:   Sodium 127 (*)    Chloride 92 (*)    Glucose, Bld 118 (*)    Calcium, Ion 1.00 (*)    All other components within normal limits  CBG MONITORING, ED - Abnormal; Notable for the following components:   Glucose-Capillary 124 (*)    All other  components within normal limits  I-STAT BETA HCG BLOOD, ED ([facility], [facility], [facility] ONLY) - Abnormal; Notable for the following components:   I-stat hCG, quantitative 7.4 (*)    All other components within normal limits  RESPIRATORY PANEL BY RT PCR (FLU A&B, COVID)  CULTURE, BLOOD (ROUTINE X 2)  CULTURE, BLOOD (ROUTINE X 2)  APTT  RAPID URINE DRUG SCREEN, HOSP PERFORMED  HIV ANTIBODY (ROUTINE TESTING W REFLEX)  HEMOGLOBIN A1C  LIPID PANEL  CBC WITH DIFFERENTIAL/PLATELET  BASIC METABOLIC PANEL    EKG None  Radiology CT ANGIO HEAD W OR WO CONTRAST  Result Date: 04/01/2019 CLINICAL DATA:  29 year old female code stroke presentation with suspicion of left side LVO. EXAM: CT ANGIOGRAPHY HEAD AND NECK CT PERFUSION BRAIN TECHNIQUE: Multidetector CT imaging of the head and neck was performed using the standard protocol during bolus administration of intravenous contrast. Multiplanar CT image reconstructions and MIPs were obtained to evaluate the vascular anatomy. Carotid stenosis measurements (when applicable) are obtained utilizing NASCET criteria, using the distal internal carotid diameter as the denominator. Multiphase CT imaging of the brain was performed following IV bolus contrast injection. Subsequent parametric perfusion maps were calculated using RAPID software. CONTRAST:  100 mL Omnipaque 350. COMPARISON:  Plain head CT 1952 hours today. FINDINGS: CT Brain Perfusion Findings: ASPECTS: 8. CBF (<30%) Volume: 4mL Perfusion (Tmax>6.0s) volume: 40mL (hypoperfusion index 0.2). Mismatch Volume: 36mL Infarction Location:Left MCA operculum and subjacent white matter. CTA NECK Skeleton: Occasional dental caries on the right. No acute osseous abnormality identified. Upper chest: There are scattered bilateral lung nodules in the upper lobes and visible superior segment lower lobes. Some of these demonstrate early cavitation (series 1, image 140. The largest visible area of involvement is at least 2 cm in the superior  segment of the right lower lobe. No pleural effusion. No superior mediastinal lymphadenopathy. Visible central pulmonary arteries are patent. Other neck: Negative. Aortic arch: 3 vessel arch configuration with no arch atherosclerosis. Incidental small venous collaterals are enhancing in the superior mediastinum prevascular space. Grossly normal visible SVC. Right carotid system: Negative. Left carotid system: Negative. Vertebral arteries: Normal proximal right subclavian artery and right vertebral artery origin. Patent and normal right vertebral to the skull base. Normal proximal left subclavian artery and left vertebral artery origin. Patent and normal left vertebral artery to the skull base. CTA HEAD Posterior circulation: Patent and  normal vertebral artery V4 segments. Patent vertebrobasilar junction and basilar artery without stenosis. Patent SCA and PCA origins. Posterior communicating arteries are diminutive or absent. Bilateral PCA branches are within normal limits. Anterior circulation: Both ICA siphons are patent. No siphon plaque or stenosis. Patent carotid termini, MCA and ACA origins. Anterior communicating artery and bilateral ACA branches are within normal limits. Right MCA M1 segment and bifurcation are patent without stenosis. Right MCA branches appear within normal limits. The left MCA M1 segment and bifurcation are patent. There is of very proximal left M3 branch occlusion identified on series 10, image 30 and on axial series 5 images 102-100. This is at the level of the mid insula. No other left MCA branch occlusion is identified. Venous sinuses: Patent, the right transverse and sigmoid sinuses appear dominant. Anatomic variants: None. Review of the MIP images confirms the above findings IMPRESSION: 1. Positive for a Left MCA proximal M3 brandch occlusion as seen on CTA series 10, image 30. 2. Positive also for multiple bilateral upper lung nodules, some with early cavitation. Consider Septic  Emboli. 3. The above was discussed by telephone with Dr. Arther Dames on 04/01/2019 at 2007 hours. 4. CTP detects a very small 4 mL core infarct with 40 mL of Left MCA territory penumbra. 5. No atherosclerosis or large vessel abnormality identified. Electronically Signed   By: Odessa Fleming M.D.   On: 04/01/2019 20:20   CT HEAD WO CONTRAST  Result Date: 04/01/2019 CLINICAL DATA:  29 year old female code stroke presentation tonight with left MCA proximal M3 occlusion. Now status post endovascular revascularization. EXAM: CT HEAD WITHOUT CONTRAST TECHNIQUE: Contiguous axial images were obtained from the base of the skull through the vertex without intravenous contrast. COMPARISON:  Plain head CT 1952 hours tonight. CTA and CTP earlier tonight. FINDINGS: Brain: No intracranial mass effect or ventriculomegaly. Some intravascular contrast is present, and mild contrast staining/stasis is suspected in the left MCA territory (series 3, image 15). There is only minimal left middle frontal gyrus white matter hypodensity noted, with no cortical cytotoxic edema evident. Basilar cisterns remain normal. Stable normal gray-white matter differentiation in the right hemisphere and posterior fossa. Vascular: New distal cervical left ICA vascular stent which continues into the proximal petrous segment (Series 4, image 15). Skull: No acute osseous abnormality identified. Sinuses/Orbits: New paranasal sinus and nasal cavity fluid and fluid levels. Tympanic cavities and mastoids remain clear. Other: There is an enteric tube looped in the nasopharynx (series 4, image 5). No acute orbit or scalp soft tissue finding. IMPRESSION: 1. Enteric tube is looped in the nasopharynx, recommend repositioning. 2. No intracranial mass effect or hemorrhage. Left MCA territory contrast staining/stasis is suspected. 3. Minimal ischemia is evident; only subtle left frontal lobe white matter hypodensity is identified. 4. New distal cervical Left ICA vascular  stent extending into the proximal petrous segment. Electronically Signed   By: Odessa Fleming M.D.   On: 04/01/2019 23:30   CT ANGIO NECK W OR WO CONTRAST  Result Date: 04/01/2019 CLINICAL DATA:  29 year old female code stroke presentation with suspicion of left side LVO. EXAM: CT ANGIOGRAPHY HEAD AND NECK CT PERFUSION BRAIN TECHNIQUE: Multidetector CT imaging of the head and neck was performed using the standard protocol during bolus administration of intravenous contrast. Multiplanar CT image reconstructions and MIPs were obtained to evaluate the vascular anatomy. Carotid stenosis measurements (when applicable) are obtained utilizing NASCET criteria, using the distal internal carotid diameter as the denominator. Multiphase CT imaging of the brain was performed  following IV bolus contrast injection. Subsequent parametric perfusion maps were calculated using RAPID software. CONTRAST:  100 mL Omnipaque 350. COMPARISON:  Plain head CT 1952 hours today. FINDINGS: CT Brain Perfusion Findings: ASPECTS: 8. CBF (<30%) Volume: 4mL Perfusion (Tmax>6.0s) volume: 40mL (hypoperfusion index 0.2). Mismatch Volume: 36mL Infarction Location:Left MCA operculum and subjacent white matter. CTA NECK Skeleton: Occasional dental caries on the right. No acute osseous abnormality identified. Upper chest: There are scattered bilateral lung nodules in the upper lobes and visible superior segment lower lobes. Some of these demonstrate early cavitation (series 1, image 140. The largest visible area of involvement is at least 2 cm in the superior segment of the right lower lobe. No pleural effusion. No superior mediastinal lymphadenopathy. Visible central pulmonary arteries are patent. Other neck: Negative. Aortic arch: 3 vessel arch configuration with no arch atherosclerosis. Incidental small venous collaterals are enhancing in the superior mediastinum prevascular space. Grossly normal visible SVC. Right carotid system: Negative. Left carotid  system: Negative. Vertebral arteries: Normal proximal right subclavian artery and right vertebral artery origin. Patent and normal right vertebral to the skull base. Normal proximal left subclavian artery and left vertebral artery origin. Patent and normal left vertebral artery to the skull base. CTA HEAD Posterior circulation: Patent and normal vertebral artery V4 segments. Patent vertebrobasilar junction and basilar artery without stenosis. Patent SCA and PCA origins. Posterior communicating arteries are diminutive or absent. Bilateral PCA branches are within normal limits. Anterior circulation: Both ICA siphons are patent. No siphon plaque or stenosis. Patent carotid termini, MCA and ACA origins. Anterior communicating artery and bilateral ACA branches are within normal limits. Right MCA M1 segment and bifurcation are patent without stenosis. Right MCA branches appear within normal limits. The left MCA M1 segment and bifurcation are patent. There is of very proximal left M3 branch occlusion identified on series 10, image 30 and on axial series 5 images 102-100. This is at the level of the mid insula. No other left MCA branch occlusion is identified. Venous sinuses: Patent, the right transverse and sigmoid sinuses appear dominant. Anatomic variants: None. Review of the MIP images confirms the above findings IMPRESSION: 1. Positive for a Left MCA proximal M3 brandch occlusion as seen on CTA series 10, image 30. 2. Positive also for multiple bilateral upper lung nodules, some with early cavitation. Consider Septic Emboli. 3. The above was discussed by telephone with Dr. Arther DamesSUSHANTH AROOR on 04/01/2019 at 2007 hours. 4. CTP detects a very small 4 mL core infarct with 40 mL of Left MCA territory penumbra. 5. No atherosclerosis or large vessel abnormality identified. Electronically Signed   By: Odessa FlemingH  Hall M.D.   On: 04/01/2019 20:20   CT CEREBRAL PERFUSION W CONTRAST  Result Date: 04/01/2019 CLINICAL DATA:  29 year old  female code stroke presentation with suspicion of left side LVO. EXAM: CT ANGIOGRAPHY HEAD AND NECK CT PERFUSION BRAIN TECHNIQUE: Multidetector CT imaging of the head and neck was performed using the standard protocol during bolus administration of intravenous contrast. Multiplanar CT image reconstructions and MIPs were obtained to evaluate the vascular anatomy. Carotid stenosis measurements (when applicable) are obtained utilizing NASCET criteria, using the distal internal carotid diameter as the denominator. Multiphase CT imaging of the brain was performed following IV bolus contrast injection. Subsequent parametric perfusion maps were calculated using RAPID software. CONTRAST:  100 mL Omnipaque 350. COMPARISON:  Plain head CT 1952 hours today. FINDINGS: CT Brain Perfusion Findings: ASPECTS: 8. CBF (<30%) Volume: 4mL Perfusion (Tmax>6.0s) volume: 40mL (hypoperfusion index 0.2).  Mismatch Volume: 33mL Infarction Location:Left MCA operculum and subjacent white matter. CTA NECK Skeleton: Occasional dental caries on the right. No acute osseous abnormality identified. Upper chest: There are scattered bilateral lung nodules in the upper lobes and visible superior segment lower lobes. Some of these demonstrate early cavitation (series 1, image 140. The largest visible area of involvement is at least 2 cm in the superior segment of the right lower lobe. No pleural effusion. No superior mediastinal lymphadenopathy. Visible central pulmonary arteries are patent. Other neck: Negative. Aortic arch: 3 vessel arch configuration with no arch atherosclerosis. Incidental small venous collaterals are enhancing in the superior mediastinum prevascular space. Grossly normal visible SVC. Right carotid system: Negative. Left carotid system: Negative. Vertebral arteries: Normal proximal right subclavian artery and right vertebral artery origin. Patent and normal right vertebral to the skull base. Normal proximal left subclavian artery and  left vertebral artery origin. Patent and normal left vertebral artery to the skull base. CTA HEAD Posterior circulation: Patent and normal vertebral artery V4 segments. Patent vertebrobasilar junction and basilar artery without stenosis. Patent SCA and PCA origins. Posterior communicating arteries are diminutive or absent. Bilateral PCA branches are within normal limits. Anterior circulation: Both ICA siphons are patent. No siphon plaque or stenosis. Patent carotid termini, MCA and ACA origins. Anterior communicating artery and bilateral ACA branches are within normal limits. Right MCA M1 segment and bifurcation are patent without stenosis. Right MCA branches appear within normal limits. The left MCA M1 segment and bifurcation are patent. There is of very proximal left M3 branch occlusion identified on series 10, image 30 and on axial series 5 images 102-100. This is at the level of the mid insula. No other left MCA branch occlusion is identified. Venous sinuses: Patent, the right transverse and sigmoid sinuses appear dominant. Anatomic variants: None. Review of the MIP images confirms the above findings IMPRESSION: 1. Positive for a Left MCA proximal M3 brandch occlusion as seen on CTA series 10, image 30. 2. Positive also for multiple bilateral upper lung nodules, some with early cavitation. Consider Septic Emboli. 3. The above was discussed by telephone with Dr. Arther Dames on 04/01/2019 at 2007 hours. 4. CTP detects a very small 4 mL core infarct with 40 mL of Left MCA territory penumbra. 5. No atherosclerosis or large vessel abnormality identified. Electronically Signed   By: Odessa Fleming M.D.   On: 04/01/2019 20:20   CT HEAD CODE STROKE WO CONTRAST  Result Date: 04/01/2019 CLINICAL DATA:  Code stroke. 29 year old female with right facial droop and aphasia. EXAM: CT HEAD WITHOUT CONTRAST TECHNIQUE: Contiguous axial images were obtained from the base of the skull through the vertex without intravenous contrast.  COMPARISON:  None. FINDINGS: Brain: Normal cerebral volume. No acute intracranial hemorrhage identified. No midline shift, mass effect, or evidence of intracranial mass lesion. No ventriculomegaly. Asymmetric hypodensity at the left insula on series 2, image 15. But no other cytotoxic edema identified in the left hemisphere. There is perhaps subtle asymmetric white matter hypodensity such as in the left middle frontal gyrus on coronal image 33. Normal gray-white matter differentiation in the right hemisphere and the posterior fossa. Vascular: No suspicious intracranial vascular hyperdensity. Skull: Negative. Sinuses/Orbits: Visualized paranasal sinuses and mastoids are clear. Other: Leftward gaze. Visualized scalp soft tissues are within normal limits. ASPECTS [facility] Stroke Program Early CT Score) - Ganglionic level infarction (caudate, lentiform nuclei, internal capsule, insula, M1-M3 cortex): 6 - Supraganglionic infarction (M4-M6 cortex): 2 Total score (0-10 with 10 being normal):  8 IMPRESSION: 1. Leftward gaze deviation with mild hypodensity in the left insula, and also perhaps involving some white matter in the middle frontal gyrus. ASPECTS 8. No associated hemorrhage or mass effect. 2. These results were communicated to Dr. Laurence Slate at 8:01 pm on 04/01/2019 by text page via the [facility] messaging system. Electronically Signed   By: Odessa Fleming M.D.   On: 04/01/2019 20:04    Procedures Procedures (including critical care time)  Medications Ordered in ED Medications  sodium chloride flush (NS) 0.9 % injection 3 mL (has no administration in time range)  eptifibatide (INTEGRILIN) 20 MG/10ML injection (has no administration in time range)  ceFAZolin (ANCEF) 2-4 GM/100ML-% IVPB (has no administration in time range)   stroke: mapping our early stages of recovery book (has no administration in time range)  0.9 %  sodium chloride infusion ( Intravenous New Bag/Given 04/01/19 2341)  acetaminophen (TYLENOL) tablet 650  mg (has no administration in time range)    Or  acetaminophen (TYLENOL) 160 MG/5ML solution 650 mg (has no administration in time range)    Or  acetaminophen (TYLENOL) suppository 650 mg (has no administration in time range)  senna-docusate (Senokot-S) tablet 1 tablet (has no administration in time range)  fentaNYL (SUBLIMAZE) 100 MCG/2ML injection (has no administration in time range)  verapamil (ISOPTIN) 2.5 MG/ML injection (has no administration in time range)  aspirin 81 MG chewable tablet (has no administration in time range)  ticagrelor (BRILINTA) 90 MG tablet (has no administration in time range)  fentaNYL (SUBLIMAZE) 100 MCG/2ML injection (has no administration in time range)  acetaminophen (TYLENOL) tablet 650 mg (has no administration in time range)    Or  acetaminophen (TYLENOL) 160 MG/5ML solution 650 mg (has no administration in time range)    Or  acetaminophen (TYLENOL) suppository 650 mg (has no administration in time range)  0.9 %  sodium chloride infusion (has no administration in time range)  ondansetron (ZOFRAN) injection 4 mg (has no administration in time range)  clevidipine (CLEVIPREX) infusion 0.5 mg/mL (has no administration in time range)  aspirin chewable tablet 81 mg (has no administration in time range)    Or  aspirin chewable tablet 81 mg (has no administration in time range)  ticagrelor (BRILINTA) tablet 90 mg (has no administration in time range)    Or  ticagrelor (BRILINTA) tablet 90 mg (has no administration in time range)  iohexol (OMNIPAQUE) 350 MG/ML injection 100 mL (100 mLs Intravenous Contrast Given 04/01/19 2014)  nitroGLYCERIN 1 mg/10 mL (100 mcg/mL) - IR/CATH LAB (25 mcg Intra-arterial Given 04/01/19 2139)  verapamil (ISOPTIN) injection (2.5 mg Intra-arterial Given 04/01/19 2140)  iohexol (OMNIPAQUE) 300 MG/ML solution 50 mL (30 mLs Intra-arterial Contrast Given 04/01/19 2315)  iohexol (OMNIPAQUE) 300 MG/ML solution 150 mL (60 mLs Intra-arterial  Contrast Given 04/01/19 2316)  aspirin chewable tablet (81 mg Per Tube Given 04/01/19 2209)  ticagrelor (BRILINTA) tablet (180 mg Per Tube Given 04/01/19 2210)  eptifibatide (INTEGRILIN) injection (1.5 mg Intravenous Given 04/01/19 2227)    ED Course  I have reviewed the triage vital signs and the nursing notes.  Pertinent labs & imaging results that were available during my care of the patient were reviewed by me and considered in my medical decision making (see chart for details).    MDM Rules/Calculators/A&P                     Patient with acute ischemic stroke.  Unsure of onset but potentially 2 hours prior.  M1 occlusion with deficits.  Van positive.  However CTA showed potential septic emboli.  Taken to IR.  CRITICAL CARE Performed by: Benjiman Core Total critical care time: 30 minutes C hemoglobin so much that ritic sure likeal care time was exclusive of separately billable procedures and treating other patients. Critical care was necessary to treat or prevent imminent or life-threatening deterioration. Critical care was time spent personally by me on the following activities: development of treatment plan with patient and/or surrogate as well as nursing, discussions with consultants, evaluation of patient's response to treatment, examination of patient, obtaining history from patient or surrogate, ordering and performing treatments and interventions, ordering and review of laboratory studies, ordering and review of radiographic studies, pulse oximetry and re-evaluation of patient's condition.  Final Clinical Impression(s) / ED Diagnoses Final diagnoses:  Cerebral infarction, unspecified mechanism [facility])    Rx / DC Orders ED Discharge Orders    None       Benjiman Core, MD 04/01/19 2352

## 2019-04-01 NOTE — Sedation Documentation (Signed)
Spoke with [place], transit support. Requested 4N32 be brought to IR2 with O2 tank and monitor.

## 2019-04-01 NOTE — Sedation Documentation (Signed)
Spoke with [place], RN, 4N Charge. Pt will be admitted to 4N32.

## 2019-04-01 NOTE — Sedation Documentation (Signed)
Pt delivered to 4N32. Groin and pulses assessed with Fleet Contras, RN. No change. See flowsheet. All questions answered to satisfaction.

## 2019-04-01 NOTE — Procedures (Signed)
S/P Lt common carotid arteriogram followed by complete revascularization of occluded M3 region of inferior division of Lt MCA  With x 1 pass with solitairex 52mm x 20 mm retriever and penumbra aspiration achieving a TICI 3 revascularization.. Placement of a 4.5 mm x 37mm pipeline flow diverter at site of focal dissection associated with a small pseudoaneurysm of distal cervical LT ICA.Marland Kitchen S.Kasem Mozer MD. Post treatment CT brain NO ICH or mass effect.Contrast stain seen over the left parietal cortical region. . Patient left intubated for airway protection. 48F angioseal for hemostasis  At the RT CFA access site. Distal pulses dopplerable DP and PT bilaterally. S.Kirsten Spearing MD

## 2019-04-01 NOTE — Anesthesia Procedure Notes (Signed)
Procedure Name: Intubation Date/Time: 04/01/2019 9:16 PM Performed by: Claudina Lick, CRNA Pre-anesthesia Checklist: Patient identified, Emergency Drugs available, Suction available, Patient being monitored and Timeout performed Patient Re-evaluated:Patient Re-evaluated prior to induction Oxygen Delivery Method: Ambu bag Preoxygenation: Pre-oxygenation with 100% oxygen Induction Type: IV induction, Rapid sequence and Cricoid Pressure applied Laryngoscope Size: Glidescope and 4 Grade View: Grade I Tube type: Oral Tube size: 7.5 mm Number of attempts: 1 Airway Equipment and Method: Stylet and Video-laryngoscopy Placement Confirmation: ETT inserted through vocal cords under direct vision,  CO2 detector and breath sounds checked- equal and bilateral Secured at: 22 cm Tube secured with: Tape Dental Injury: Teeth and Oropharynx as per pre-operative assessment

## 2019-04-01 NOTE — Anesthesia Postprocedure Evaluation (Signed)
Anesthesia Post Note  Patient: Lauren Reyes  Procedure(s) Performed: IR WITH ANESTHESIA (N/A )     Patient location during evaluation: ICU Anesthesia Type: General Level of consciousness: patient remains intubated per anesthesia plan Pain management: pain level controlled Vital Signs Assessment: post-procedure vital signs reviewed and stable Respiratory status: patient remains intubated per anesthesia plan Cardiovascular status: stable Postop Assessment: no apparent nausea or vomiting Anesthetic complications: no    Last Vitals:  Vitals:   04/01/19 2038  BP: 114/79  Pulse: (!) 125  Resp: 16  SpO2: 100%    Last Pain:  Vitals:   04/01/19 2038  PainSc: 0-No pain                 Shamika Pedregon

## 2019-04-01 NOTE — ED Triage Notes (Signed)
Pt BIB [facility] from [facility] where she leaves as CODE STROKE. Pt was talking on phone with police when she suddenly began developing strok sx including r. Sided deficits, slurred speech.  LSN 1800  No PMH   VSS with EMS but tachycardic in 120s

## 2019-04-01 NOTE — Progress Notes (Signed)
Patient ID: Lauren Reyes, female   DOB: 29-Aug-1990, 29 y.o.   MRN: [id number] INR 28 Y RT H F  LSW ? This evening. Sudden sonset of aphasia and rt sided weakness whilst on the phone.. Ct brain No ICH ASPECTS 8.  CTA occluded M3 seg of inf division of LT MCA. CTP core of 4 ml with penumbra of 36 ml. Patient felt to meet the criteria for endovascular revascularization given the eloquent area involved.. An emergent 2 physician consent was obtained as no family or NOK immediately available. S.Ettie Krontz MD

## 2019-04-02 ENCOUNTER — Inpatient Hospital Stay (HOSPITAL_COMMUNITY): Payer: Medicaid Other

## 2019-04-02 ENCOUNTER — Encounter: Payer: Self-pay | Admitting: *Deleted

## 2019-04-02 DIAGNOSIS — J9601 Acute respiratory failure with hypoxia: Secondary | ICD-10-CM

## 2019-04-02 DIAGNOSIS — Z452 Encounter for adjustment and management of vascular access device: Secondary | ICD-10-CM

## 2019-04-02 DIAGNOSIS — T148XXA Other injury of unspecified body region, initial encounter: Secondary | ICD-10-CM

## 2019-04-02 DIAGNOSIS — I351 Nonrheumatic aortic (valve) insufficiency: Secondary | ICD-10-CM

## 2019-04-02 DIAGNOSIS — Z978 Presence of other specified devices: Secondary | ICD-10-CM

## 2019-04-02 DIAGNOSIS — I82409 Acute embolism and thrombosis of unspecified deep veins of unspecified lower extremity: Secondary | ICD-10-CM | POA: Diagnosis present

## 2019-04-02 DIAGNOSIS — I63411 Cerebral infarction due to embolism of right middle cerebral artery: Secondary | ICD-10-CM

## 2019-04-02 DIAGNOSIS — I824Y9 Acute embolism and thrombosis of unspecified deep veins of unspecified proximal lower extremity: Secondary | ICD-10-CM

## 2019-04-02 DIAGNOSIS — I469 Cardiac arrest, cause unspecified: Secondary | ICD-10-CM

## 2019-04-02 DIAGNOSIS — I639 Cerebral infarction, unspecified: Secondary | ICD-10-CM

## 2019-04-02 DIAGNOSIS — I340 Nonrheumatic mitral (valve) insufficiency: Secondary | ICD-10-CM

## 2019-04-02 DIAGNOSIS — R509 Fever, unspecified: Secondary | ICD-10-CM

## 2019-04-02 DIAGNOSIS — J9600 Acute respiratory failure, unspecified whether with hypoxia or hypercapnia: Secondary | ICD-10-CM | POA: Diagnosis present

## 2019-04-02 DIAGNOSIS — J9602 Acute respiratory failure with hypercapnia: Secondary | ICD-10-CM

## 2019-04-02 LAB — CBC
HCT: 27.5 % — ABNORMAL LOW (ref 36.0–46.0)
HCT: 30.4 % — ABNORMAL LOW (ref 36.0–46.0)
HCT: 29 % — ABNORMAL LOW (ref 36.0–46.0)
Hemoglobin: 9.3 g/dL — ABNORMAL LOW (ref 12.0–15.0)
Hemoglobin: 10.2 g/dL — ABNORMAL LOW (ref 12.0–15.0)
Hemoglobin: 9.9 g/dL — ABNORMAL LOW (ref 12.0–15.0)
MCH: 25.7 pg — ABNORMAL LOW (ref 26.0–34.0)
MCH: 25.7 pg — ABNORMAL LOW (ref 26.0–34.0)
MCH: 25.5 pg — ABNORMAL LOW (ref 26.0–34.0)
MCHC: 33.8 g/dL (ref 30.0–36.0)
MCHC: 33.6 g/dL (ref 30.0–36.0)
MCHC: 34.1 g/dL (ref 30.0–36.0)
MCV: 76 fL — ABNORMAL LOW (ref 80.0–100.0)
MCV: 76.6 fL — ABNORMAL LOW (ref 80.0–100.0)
MCV: 74.7 fL — ABNORMAL LOW (ref 80.0–100.0)
Platelets: 257 10*3/uL (ref 150–400)
Platelets: 219 10*3/uL (ref 150–400)
Platelets: 270 10*3/uL (ref 150–400)
RBC: 3.62 MIL/uL — ABNORMAL LOW (ref 3.87–5.11)
RBC: 3.97 MIL/uL (ref 3.87–5.11)
RBC: 3.88 MIL/uL (ref 3.87–5.11)
RDW: 14.3 % (ref 11.5–15.5)
RDW: 14.5 % (ref 11.5–15.5)
RDW: 14.2 % (ref 11.5–15.5)
WBC: 26.4 10*3/uL — ABNORMAL HIGH (ref 4.0–10.5)
WBC: 23.5 10*3/uL — ABNORMAL HIGH (ref 4.0–10.5)
WBC: 27.5 10*3/uL — ABNORMAL HIGH (ref 4.0–10.5)
nRBC: 0 % (ref 0.0–0.2)
nRBC: 0 % (ref 0.0–0.2)

## 2019-04-02 LAB — CBC WITH DIFFERENTIAL/PLATELET
Abs Immature Granulocytes: 0.73 10*3/uL — ABNORMAL HIGH (ref 0.00–0.07)
Basophils Absolute: 0.1 10*3/uL (ref 0.0–0.1)
Basophils Relative: 1 %
Eosinophils Absolute: 0 10*3/uL (ref 0.0–0.5)
Eosinophils Relative: 0 %
HCT: 32.5 % — ABNORMAL LOW (ref 36.0–46.0)
Hemoglobin: 11.2 g/dL — ABNORMAL LOW (ref 12.0–15.0)
Immature Granulocytes: 3 %
Lymphocytes Relative: 6 %
Lymphs Abs: 1.2 10*3/uL (ref 0.7–4.0)
MCH: 26 pg (ref 26.0–34.0)
MCHC: 34.5 g/dL (ref 30.0–36.0)
MCV: 75.6 fL — ABNORMAL LOW (ref 80.0–100.0)
Monocytes Absolute: 0.9 10*3/uL (ref 0.1–1.0)
Monocytes Relative: 4 %
Neutro Abs: 18.4 10*3/uL — ABNORMAL HIGH (ref 1.7–7.7)
Neutrophils Relative %: 86 %
Platelets: 254 10*3/uL (ref 150–400)
RBC: 4.3 MIL/uL (ref 3.87–5.11)
RDW: 14.1 % (ref 11.5–15.5)
WBC: 21.4 10*3/uL — ABNORMAL HIGH (ref 4.0–10.5)
nRBC: 0 % (ref 0.0–0.2)

## 2019-04-02 LAB — BASIC METABOLIC PANEL
Anion gap: 11 (ref 5–15)
BUN: 5 mg/dL — ABNORMAL LOW (ref 6–20)
CO2: 24 mmol/L (ref 22–32)
Calcium: 7.8 mg/dL — ABNORMAL LOW (ref 8.9–10.3)
Chloride: 95 mmol/L — ABNORMAL LOW (ref 98–111)
Creatinine, Ser: 0.64 mg/dL (ref 0.44–1.00)
GFR calc Af Amer: >60 mL/min (ref >60)
GFR calc non Af Amer: >60 mL/min (ref >60)
Glucose, Bld: 133 mg/dL — ABNORMAL HIGH (ref 70–99)
Potassium: 3.8 mmol/L (ref 3.5–5.1)
Sodium: 130 mmol/L — ABNORMAL LOW (ref 135–145)

## 2019-04-02 LAB — RAPID URINE DRUG SCREEN, HOSP PERFORMED
Amphetamines: POSITIVE — AB
Barbiturates: NOT DETECTED
Benzodiazepines: NOT DETECTED
Cocaine: NOT DETECTED
Opiates: NOT DETECTED
Tetrahydrocannabinol: POSITIVE — AB

## 2019-04-02 LAB — POCT I-STAT 7, (LYTES, BLD GAS, ICA,H+H)
Acid-Base Excess: 4 mmol/L — ABNORMAL HIGH (ref 0.0–2.0)
Bicarbonate: 30.5 mmol/L — ABNORMAL HIGH (ref 20.0–28.0)
Calcium, Ion: 1.12 mmol/L — ABNORMAL LOW (ref 1.15–1.40)
HCT: 36 % (ref 36.0–46.0)
Hemoglobin: 12.2 g/dL (ref 12.0–15.0)
O2 Saturation: 100 %
Potassium: 3.7 mmol/L (ref 3.5–5.1)
Sodium: 132 mmol/L — ABNORMAL LOW (ref 135–145)
TCO2: 32 mmol/L (ref 22–32)
pCO2 arterial: 55.7 mmHg — ABNORMAL HIGH (ref 32.0–48.0)
pH, Arterial: 7.347 — ABNORMAL LOW (ref 7.350–7.450)
pO2, Arterial: 523 mmHg — ABNORMAL HIGH (ref 83.0–108.0)

## 2019-04-02 LAB — LIPID PANEL
Cholesterol: 163 mg/dL (ref 0–200)
HDL: <10 mg/dL — ABNORMAL LOW (ref >40)
Triglycerides: 258 mg/dL — ABNORMAL HIGH (ref <150)
VLDL: 52 mg/dL — ABNORMAL HIGH (ref 0–40)

## 2019-04-02 LAB — HCG, QUANTITATIVE, PREGNANCY: hCG, Beta Chain, Quant, S: 2 m[IU]/mL (ref <5)

## 2019-04-02 LAB — TSH: TSH: 0.879 u[IU]/mL (ref 0.350–4.500)

## 2019-04-02 LAB — ECHOCARDIOGRAM COMPLETE
Height: 65 in
Weight: 2550.28 [oz_av]

## 2019-04-02 LAB — RPR: RPR Ser Ql: NONREACTIVE

## 2019-04-02 LAB — ABO/RH: ABO/RH(D): O POS

## 2019-04-02 LAB — HEMOGLOBIN A1C
Hgb A1c MFr Bld: 5.9 % — ABNORMAL HIGH (ref 4.8–5.6)
Mean Plasma Glucose: 122.63 mg/dL

## 2019-04-02 LAB — ANTITHROMBIN III: AntiThromb III Func: 72 % — ABNORMAL LOW (ref 75–120)

## 2019-04-02 LAB — PLATELET INHIBITION P2Y12: Platelet Function  P2Y12: 115 [PRU] — ABNORMAL LOW (ref 182–335)

## 2019-04-02 LAB — MRSA PCR SCREENING: MRSA by PCR: NEGATIVE

## 2019-04-02 LAB — MAGNESIUM: Magnesium: 1.9 mg/dL (ref 1.7–2.4)

## 2019-04-02 LAB — VITAMIN B12: Vitamin B-12: 175 pg/mL — ABNORMAL LOW (ref 180–914)

## 2019-04-02 LAB — PREGNANCY, URINE: Preg Test, Ur: NEGATIVE

## 2019-04-02 LAB — HIV ANTIBODY (ROUTINE TESTING W REFLEX): HIV Screen 4th Generation wRfx: NONREACTIVE

## 2019-04-02 MED ORDER — VITAMIN B-12 1000 MCG PO TABS
1000.0000 ug | ORAL_TABLET | Freq: Every day | ORAL | Status: DC
  Filled 2019-04-02: qty 1

## 2019-04-02 MED ORDER — FENTANYL 2500MCG IN NS 250ML (10MCG/ML) PREMIX INFUSION
0.0000 ug/h | INTRAVENOUS | Status: DC
  Administered 2019-04-02: 100 ug/h via INTRAVENOUS
  Administered 2019-04-03: 150 ug/h via INTRAVENOUS
  Administered 2019-04-03: 200 ug/h via INTRAVENOUS
  Filled 2019-04-02 (×4): qty 250

## 2019-04-02 MED ORDER — PIPERACILLIN-TAZOBACTAM 3.375 G IVPB
3.3750 g | Freq: Three times a day (TID) | INTRAVENOUS | Status: DC
  Administered 2019-04-02 (×2): 3.375 g via INTRAVENOUS
  Filled 2019-04-02 (×2): qty 50

## 2019-04-02 MED ORDER — AMIODARONE IV BOLUS ONLY 150 MG/100ML: 150.0000 mg | Freq: Once | INTRAVENOUS | Status: DC

## 2019-04-02 MED ORDER — CYANOCOBALAMIN 1000 MCG/ML IJ SOLN
1000.0000 ug | Freq: Once | INTRAMUSCULAR | Status: AC
  Administered 2019-04-02: 1000 ug via INTRAMUSCULAR
  Filled 2019-04-02: qty 1

## 2019-04-02 MED ORDER — DEXMEDETOMIDINE HCL IN NACL 400 MCG/100ML IV SOLN
0.4000 ug/kg/h | INTRAVENOUS | Status: DC
  Administered 2019-04-02: 0.6 ug/kg/h via INTRAVENOUS
  Administered 2019-04-02: 1.2 ug/kg/h via INTRAVENOUS
  Administered 2019-04-02: 0.9 ug/kg/h via INTRAVENOUS
  Administered 2019-04-02: 0.4 ug/kg/h via INTRAVENOUS
  Administered 2019-04-03: 0.8 ug/kg/h via INTRAVENOUS
  Administered 2019-04-03: 0.6 ug/kg/h via INTRAVENOUS
  Administered 2019-04-04: 0.703 ug/kg/h via INTRAVENOUS
  Administered 2019-04-04: 0.6 ug/kg/h via INTRAVENOUS
  Filled 2019-04-02 (×4): qty 100
  Filled 2019-04-02: qty 200
  Filled 2019-04-02 (×4): qty 100

## 2019-04-02 MED ORDER — PHENYLEPHRINE HCL-NACL 10-0.9 MG/250ML-% IV SOLN
INTRAVENOUS | Status: AC
  Administered 2019-04-02: 20 ug/min via INTRAVENOUS
  Filled 2019-04-02: qty 250

## 2019-04-02 MED ORDER — FENTANYL CITRATE (PF) 100 MCG/2ML IJ SOLN
INTRAMUSCULAR | Status: AC
  Administered 2019-04-02: 100 ug via INTRAVENOUS
  Filled 2019-04-02: qty 2

## 2019-04-02 MED ORDER — CHLORHEXIDINE GLUCONATE 0.12% ORAL RINSE (MEDLINE KIT)
15.0000 mL | Freq: Two times a day (BID) | OROMUCOSAL | Status: DC
  Administered 2019-04-02 – 2019-04-27 (×29): 15 mL via OROMUCOSAL

## 2019-04-02 MED ORDER — FENTANYL CITRATE (PF) 100 MCG/2ML IJ SOLN: 50.0000 ug | INTRAMUSCULAR | Status: DC | PRN

## 2019-04-02 MED ORDER — LACTATED RINGERS IV BOLUS
500.0000 mL | Freq: Once | INTRAVENOUS | Status: AC
  Administered 2019-04-02: 500 mL via INTRAVENOUS

## 2019-04-02 MED ORDER — SODIUM CHLORIDE 0.9 % IV SOLN
2.0000 g | Freq: Three times a day (TID) | INTRAVENOUS | Status: DC
  Administered 2019-04-02 – 2019-04-04 (×6): 2 g via INTRAVENOUS
  Filled 2019-04-02 (×7): qty 2

## 2019-04-02 MED ORDER — LACTATED RINGERS IV BOLUS
1000.0000 mL | Freq: Once | INTRAVENOUS | Status: AC
  Administered 2019-04-02: 1000 mL via INTRAVENOUS

## 2019-04-02 MED ORDER — ORAL CARE MOUTH RINSE
15.0000 mL | OROMUCOSAL | Status: DC
  Administered 2019-04-02 – 2019-04-04 (×25): 15 mL via OROMUCOSAL

## 2019-04-02 MED ORDER — SODIUM CHLORIDE 0.9 % IV BOLUS
1000.0000 mL | Freq: Once | INTRAVENOUS | Status: AC
  Administered 2019-04-02: 1000 mL via INTRAVENOUS

## 2019-04-02 MED ORDER — PHENYLEPHRINE HCL-NACL 10-0.9 MG/250ML-% IV SOLN
0.0000 ug/min | INTRAVENOUS | Status: DC
  Administered 2019-04-02: 180 ug/min via INTRAVENOUS
  Administered 2019-04-02: 200 ug/min via INTRAVENOUS
  Administered 2019-04-02: 180 ug/min via INTRAVENOUS
  Administered 2019-04-02: 220 ug/min via INTRAVENOUS
  Administered 2019-04-02 (×5): 200 ug/min via INTRAVENOUS
  Administered 2019-04-02: 240 ug/min via INTRAVENOUS
  Administered 2019-04-02: 200 ug/min via INTRAVENOUS
  Administered 2019-04-02: 220 ug/min via INTRAVENOUS
  Administered 2019-04-02 (×2): 200 ug/min via INTRAVENOUS
  Administered 2019-04-02: 240 ug/min via INTRAVENOUS
  Administered 2019-04-02 (×2): 200 ug/min via INTRAVENOUS
  Administered 2019-04-03: 250 ug/min via INTRAVENOUS
  Administered 2019-04-03: 270 ug/min via INTRAVENOUS
  Filled 2019-04-02 (×2): qty 500
  Filled 2019-04-02: qty 250
  Filled 2019-04-02: qty 500
  Filled 2019-04-02: qty 1000
  Filled 2019-04-02 (×2): qty 500
  Filled 2019-04-02: qty 1000
  Filled 2019-04-02: qty 500
  Filled 2019-04-02: qty 250

## 2019-04-02 MED ORDER — ATORVASTATIN CALCIUM 80 MG PO TABS: 80.0000 mg | ORAL_TABLET | Freq: Every day | ORAL | Status: DC

## 2019-04-02 MED ORDER — CHLORHEXIDINE GLUCONATE CLOTH 2 % EX PADS
6.0000 | MEDICATED_PAD | Freq: Every day | CUTANEOUS | Status: DC
  Administered 2019-04-03 – 2019-04-27 (×25): 6 via TOPICAL

## 2019-04-02 MED ORDER — VANCOMYCIN HCL IN DEXTROSE 1-5 GM/200ML-% IV SOLN
1000.0000 mg | Freq: Two times a day (BID) | INTRAVENOUS | Status: DC
  Administered 2019-04-02 – 2019-04-03 (×3): 1000 mg via INTRAVENOUS
  Filled 2019-04-02 (×3): qty 200

## 2019-04-02 MED ORDER — CALCIUM GLUCONATE-NACL 1-0.675 GM/50ML-% IV SOLN
1.0000 g | Freq: Once | INTRAVENOUS | Status: AC
  Administered 2019-04-02: 1000 mg via INTRAVENOUS
  Filled 2019-04-02: qty 50

## 2019-04-02 MED ORDER — PROPOFOL 1000 MG/100ML IV EMUL
0.0000 ug/kg/min | INTRAVENOUS | Status: DC
  Administered 2019-04-02: 20 ug/kg/min via INTRAVENOUS

## 2019-04-02 MED ORDER — GADOBUTROL 1 MMOL/ML IV SOLN
7.0000 mL | Freq: Once | INTRAVENOUS | Status: AC | PRN
  Administered 2019-04-02: 7 mL via INTRAVENOUS

## 2019-04-02 MED ORDER — VANCOMYCIN HCL 10 G IV SOLR
1500.0000 mg | Freq: Once | INTRAVENOUS | Status: AC
  Administered 2019-04-02: 1500 mg via INTRAVENOUS
  Filled 2019-04-02: qty 1000

## 2019-04-02 NOTE — Progress Notes (Signed)
Right pseudoaneurysm check and bilateral lower extremity venous duplex has been completed. Preliminary results can be found in CV Proc through chart review.  Results were given to the patient's nurse, Jen.  04/02/19 11:42 AM Olen Cordial RVT

## 2019-04-02 NOTE — Progress Notes (Signed)
eLink Physician-Brief Progress Note Patient Name: Lauren Reyes DOB: June 17, 1990 MRN: [id number]   Date of Service  04/02/2019  HPI/Events of Note  Sinus tachycardia improved, but not resolved with 1000 ml iv fluid bolus.  eICU Interventions  LR 1000 ml iv fluid bolus at the rate of 500 ml / Hour.        Thomasene Lot Sherlock Nancarrow 04/02/2019, 2:27 AM

## 2019-04-02 NOTE — Progress Notes (Signed)
OT Cancellation Note  Patient Details Name: Lauren Reyes MRN: [id number] DOB: 1990-03-10   Cancelled Treatment:    Reason Eval/Treat Not Completed: Patient's level of consciousness;Active bedrest order. Pt intubated and sedated. Per RN report, pt with decreased arousal and following of commands.   Ishmael Berkovich M Chiante Peden Kenae Lindquist MSOT, OTR/L Acute Rehab Pager: [phone] Office: [phone] 04/02/2019, 1:23 PM

## 2019-04-02 NOTE — Progress Notes (Signed)
Pharmacy Antibiotic Note  Lauren Reyes is a 29 y.o. female admitted on 04/01/2019 with new CVA and incidental findings concerning for pulmonary septic emboli. Pharmacy was consulted for Vancomycin + Zosyn dosing for rule out endocarditis, now being transitioned from Zosyn to cefepime.    The patient is revascularization by IR for occluded L-MCA. The patient also had a mildly elevated hCG - early pregnancy is currently being evaluated.   SCr 0.7, CrCl~80-100 ml/min.   Plan: - Vancomycin 1500 mg IV x 1 followed by 1g IV every 12 hours - Discontinue Zosyn and start cefepime 2g IV q8hr - Will continue to follow renal function, culture results, LOT, and antibiotic de-escalation plans    Height: 5\' 5"  (165.1 cm) Weight: 159 lb 6.3 oz (72.3 kg) IBW/kg (Calculated) : 57  Temp (24hrs), Avg:99.8 F (37.7 C), Min:98.2 F (36.8 C), Max:101.4 F (38.6 C)  Recent Labs  Lab 04/01/19 1943 04/01/19 2003 04/02/19 0013  WBC 19.5*  --  21.4*  CREATININE 0.76 0.70 0.64    Estimated Creatinine Clearance: 102.4 mL/min (by C-G formula based on SCr of 0.64 mg/dL).    Not on File  Antimicrobials this admission: Vanc 3/25 >> Zosyn 3/25 >> 3/25 Cefepime 3/25 >>  Dose adjustments this admission: n/a  Microbiology results: 3/24 COVID/Flu >> neg 3/25 BCx >> 3/25 RCx >> Gram positive cocci in clusters, pending identification 3/25 MRSA PCR >> Negative  Thank you for allowing pharmacy to be a part of this patient's care.  4/25, PharmD PGY1 Pharmacy Resident

## 2019-04-02 NOTE — Progress Notes (Signed)
Patient transported to CT & back on the ventilator with no problems. ?

## 2019-04-02 NOTE — Progress Notes (Signed)
Sputum culture collected and sent to the lab. 

## 2019-04-02 NOTE — Progress Notes (Signed)
Kacie from IR called to give verbal order for brillinta per tube without waiting for P2Y12 results.

## 2019-04-02 NOTE — Progress Notes (Addendum)
NAME:  Lauren Reyes, MRN:  [id number], DOB:  Jun 26, 1990, LOS: 1 ADMISSION DATE:  04/01/2019, CONSULTATION DATE: 04/02/2019 REFERRING MD: Corliss Skains, CHIEF COMPLAINT: CVA  Brief History     29 year old female with suspected IV drug abuse adm with sudden onset right-sided weakness and left MCA occlusion status post revascularization by neuro IR and left ICA stent  Past Medical History  No available previous medical history  Significant Hospital Events   NA Consults:  PCCM, interventional radiology  Procedures:  3/24 S/P Lt common carotid arteriogram followed by complete revascularization of occluded M3 region of inferior division of Lt MCA , Placement of a 4.5 mm x 65mm pipeline flow diverter at site of focal dissection associated with a small pseudoaneurysm of distal cervical LT ICA.Marland Kitchen   Significant Diagnostic Tests:  CT scan of the brain / neck  along with perfusion scan 3/24 >> Positive for a Left MCA proximal M3 brandch occlusion  Positive also for multiple bilateral upper lung nodules, some with early cavitation.  Micro Data:  Blood culture 3/25 >>  Antimicrobials:  3/25  vancomycin >> 3/25 Zosyn>> SARS neg MRSA pcr neg  Interim history/subjective:   Critically ill, intubated, sedated on Precedex and fentanyl Febrile 101 On Neo-Synephrine 200 to maintain blood pressure  Objective   Blood pressure 127/75, pulse 90, temperature (!) 101.4 F (38.6 C), resp. rate 19, height 5\' 5"  (1.651 m), weight 72.3 kg, SpO2 100 %.    Vent Mode: PRVC FiO2 (%):  [30 %-100 %] 30 % Set Rate:  [16 bmp] 16 bmp Vt Set:  [450 mL] 450 mL PEEP:  [5 cmH20] 5 cmH20 Plateau Pressure:  [16 cmH20-17 cmH20] 16 cmH20   Intake/Output Summary (Last 24 hours) at 04/02/2019 0910 Last data filed at 04/02/2019 0400 Gross per 24 hour  Intake 1939.67 ml  Output 800 ml  Net 1139.67 ml   Filed Weights   04/01/19 1900 04/01/19 2023  Weight: 72.3 kg 72.3 kg    Examination: General: AA woman  ventilated sedated HENT: Intubated , no JVD,  Lungs: Clear Cardiovascular: 2/6 systolic ejection murmur Abdomen: Soft benign Extremities: Within normal limits, scars of her left arm, RT groin hematoma Neuro: Sedated, RASS -3,  GU: N/A  Labs show leukocytosis, last hemoglobin of 11.2 around midnight, mild hyponatremia low ionized calcium  Chest x-ray 3/25 shows minimal left base atelectasis  Resolved Hospital Problem list   NA  Assessment & Plan:    Lt MCA CVA status post revascularization , concern today is for groin hematoma With her IVDU, concern is for infective endocarditis causing septic emboli/pulmonary nodules in the lungs  Acute CVA -Per neurology Precedex/fentanyl for goal RASS -1  Relative hypotension-goal SBP 1 20-1 40 per neurology Use Neo-Synephrine to achieve  Groin hematoma-pressure Check CBC every 4 hours x3 Stat arterial Doppler, if unable to obtain proceed with CT abdomen/pelvis without contrast Vascular consult of pseudoaneurysm  Concern for infective endocarditis /pulmonary nodules?  Cavitating noted on CT neck  -await blood cultures, echo Continue vancomycin and Zosyn in the meantime Obtain CT chest without contrast to clarify nodules   Postop respiratory failure -  If no significant bleeding in the groin, then we will proceed with wean to extubation  Substance abuse -UDS positive for THC/amphetamines Watch for withdrawal  Low positive beta-hCG-minimize radiation exposure if possible   Best practice:  Diet: N.p.o. Pain/Anxiety/Delirium protocol (if indicated): Precedex/fentanyl, goal RASS -1 VAP protocol (if indicated): Yes DVT prophylaxis: SCDs patient on anticoagulation GI prophylaxis: Pepcid  Glucose control: Monitor Mobility: Bedrest Code Status: Full Family Communication: Not available Disposition:  ICU   The patient is critically ill with multiple organ systems failure and requires high complexity decision making for assessment and  support, frequent evaluation and titration of therapies, application of advanced monitoring technologies and extensive interpretation of multiple databases. Critical Care Time devoted to patient care services described in this note independent of APP/resident  time is 35 minutes.   Kara Mead MD. Shade Flood. [facility]  If no response to pager , please call [phone]   04/02/2019

## 2019-04-02 NOTE — Progress Notes (Signed)
eLink Physician-Brief Progress Note Patient Name: Lauren Reyes DOB: 08-Jun-1990 MRN: [id number]   Date of Service  04/02/2019  HPI/Events of Note  Hypotension - Now on ceiling dose of Phenylephrine IV infusion.   eICU Interventions  Will order: 1. Bolus with 0.9 NaCl 1 liter IV over 1 hour now. 2. Increase ceiling of Phenylephrine IV infusion to 300 mcg/hour.  3 Will notify ground team of need for CVL.     Intervention Category Major Interventions: Hypotension - evaluation and management  Lenell Antu 04/02/2019, 9:49 PM

## 2019-04-02 NOTE — Progress Notes (Signed)
Patient transported to MRI & back on the ventilator with no problems. ?

## 2019-04-02 NOTE — Progress Notes (Signed)
  Echocardiogram 2D Echocardiogram has been performed.  Lauren Reyes 04/02/2019, 12:16 PM

## 2019-04-02 NOTE — Consult Note (Signed)
 NAME:  Lauren Reyes, MRN:  [id number], DOB:  08/14/1990, LOS: 1 ADMISSION DATE:  04/01/2019, CONSULTATION DATE: 04/02/2019 REFERRING MD: Deveshwar, CHIEF COMPLAINT: CVA  Brief History   29-year-old with sudden onset right-sided weakness and left MCA occlusion  History of present illness   Patient is a 29-year-old female with suspected IV drug abuse who was being interviewed by police tonight and developed left-sided weakness in her face.  She was brought by EMS to the emergency room work-up revealed occluded left MCA.  We are reviewing her case while in the neuro ICU status post revascularization per interventional radiology.  The patient is ventilated.  Does have multiple scars over her left arm.  Is currently on propofol with a flat neurologic exam although she does have pupillary reflex.  Patient has had a set of blood cultures sent although not been started on antibiotics.  Toxicology drug screen is pending.  White count is 19,000.  CT of the head and neck does show multiple small cavitary abscesses in the upper lobes bilaterally.  She does have a systolic murmur.  Echocardiogram has been performed although has not been read.  Past Medical History  No available previous medical history  Significant Hospital Events   NA Consults:  PCCM, interventional radiology  Procedures:  Status post revascularization  Significant Diagnostic Tests:  CT scan of the brain along with perfusion scan  Micro Data:  Blood culture sent  Antimicrobials:  Empiric vancomycin and Zosyn for possible endocarditis  Interim history/subjective:  NA  Objective   Blood pressure (!) 146/106, pulse (!) 142, temperature 98.2 F (36.8 C), temperature source Axillary, resp. rate (!) 0, height 5' 5" (1.651 m), weight 72.3 kg, SpO2 100 %.    Vent Mode: PRVC FiO2 (%):  [100 %] 100 % Set Rate:  [16 bmp] 16 bmp Vt Set:  [450 mL] 450 mL PEEP:  [5 cmH20] 5 cmH20 Plateau Pressure:  [17 cmH20] 17 cmH20    Intake/Output Summary (Last 24 hours) at 04/02/2019 0050 Last data filed at 04/01/2019 2242 Gross per 24 hour  Intake 700 ml  Output 200 ml  Net 500 ml   Filed Weights   04/01/19 1900 04/01/19 2023  Weight: 72.3 kg 72.3 kg    Examination: General: Black female ventilated sedated HENT: Intubated otherwise normal Lungs: Clear Cardiovascular: 2/6 systolic ejection murmur Abdomen: Soft benign Extremities: Within normal limits, scars of her left arm. Neuro: Sedated GU: N/A  Resolved Hospital Problem list   NA  Assessment & Plan:  1.  CVA status post revascularization: With findings in upper lobes consisting of multiple small abscesses this seems most likely secondary to septic emboli due to drug IV drug abuse.  We are going to replant another set of cultures start her on vancomycin and Zosyn.  2.  Probable history of IV drug abuse: Monitor  3.  Status post CVA: Per interventional radiology and monitor  4.  Equivocal serum beta-hCG.  Will repeat.  5.  On ventilator will reevaluate in the morning while sedating with propofol.    Best practice:  Diet: N.p.o. Pain/Anxiety/Delirium protocol (if indicated): Conscious sedation VAP protocol (if indicated): Yes DVT prophylaxis: SCDs patient on anticoagulation GI prophylaxis: Pepcid Glucose control: Monitor Mobility: Bedrest Code Status: Full Family Communication: Not available Disposition: Treatment in ICU  Labs   CBC: Recent Labs  Lab 04/01/19 1943 04/01/19 2003 04/02/19 0032  WBC 19.5*  --   --   NEUTROABS 17.0*  --   --     HGB 11.9* 12.6 12.2  HCT 35.3* 37.0 36.0  MCV 75.4*  --   --   PLT 285  --   --     Basic Metabolic Panel: Recent Labs  Lab 04/01/19 1943 04/01/19 2003 04/02/19 0032  NA 129* 127* 132*  K 4.5 4.0 3.7  CL 93* 92*  --   CO2 24  --   --   GLUCOSE 118* 118*  --   BUN 6 9  --   CREATININE 0.76 0.70  --   CALCIUM 8.5*  --   --    GFR: Estimated Creatinine Clearance: 102.4 mL/min (by C-G  formula based on SCr of 0.7 mg/dL). Recent Labs  Lab 04/01/19 1943  WBC 19.5*    Liver Function Tests: Recent Labs  Lab 04/01/19 1943  AST 63*  ALT 40  ALKPHOS 112  BILITOT 0.9  PROT 7.0  ALBUMIN 2.3*   No results for input(s): LIPASE, AMYLASE in the last 168 hours. No results for input(s): AMMONIA in the last 168 hours.  ABG    Component Value Date/Time   PHART 7.347 (L) 04/02/2019 0032   PCO2ART 55.7 (H) 04/02/2019 0032   PO2ART 523.0 (H) 04/02/2019 0032   HCO3 30.5 (H) 04/02/2019 0032   TCO2 32 04/02/2019 0032   O2SAT 100.0 04/02/2019 0032     Coagulation Profile: Recent Labs  Lab 04/01/19 1943  INR 1.5*    Cardiac Enzymes: No results for input(s): CKTOTAL, CKMB, CKMBINDEX, TROPONINI in the last 168 hours.  HbA1C: No results found for: HGBA1C  CBG: Recent Labs  Lab 04/01/19 1947  GLUCAP 124*    Review of Systems:   Unable to obtain patient sedated and on ventilator  Past Medical History  She,  has no past medical history on file.   Surgical History   History reviewed. No pertinent surgical history.   Social History      Family History   Her family history is not on file.   Allergies Not on File   Home Medications  Prior to Admission medications   Not on File     Critical care time: Over 35 minutes spent in bedside evaluation chart review critical care planning        

## 2019-04-02 NOTE — Progress Notes (Signed)
Referring Physician(s): CODE STROKE  Supervising Physician: Julieanne Cottoneveshwar, Sanjeev  [facility]  [facility]  Chief Complaint: L MCA occlusion   Subjective: Intubated, sedated Febrile Active oozing noted from groin.   Allergies: Patient has no allergy information on record.  Medications: Prior to Admission medications   Not on File     Vital Signs: BP 118/68    Pulse 96    Temp (!) 101.4 F (38.6 C) Comment: tylenol given   Resp (!) 9    Ht 5\' 5"  (1.651 m)    Wt 159 lb 6.3 oz (72.3 kg)    SpO2 100%    BMI 26.52 kg/m   Physical Exam  Intubated, sedated Diaphoretic Withdrawals left side to pain. No movement noted in R upper or lower extremity Groin: large hematoma noted medial to the puncture site.  Active oozing.  Palapable extension extension from R hip to medial thigh as well as R labia.    Imaging: CT ANGIO HEAD W OR WO CONTRAST  Result Date: 04/01/2019 CLINICAL DATA:  29 year old female code stroke presentation with suspicion of left side LVO. EXAM: CT ANGIOGRAPHY HEAD AND NECK CT PERFUSION BRAIN TECHNIQUE: Multidetector CT imaging of the head and neck was performed using the standard protocol during bolus administration of intravenous contrast. Multiplanar CT image reconstructions and MIPs were obtained to evaluate the vascular anatomy. Carotid stenosis measurements (when applicable) are obtained utilizing NASCET criteria, using the distal internal carotid diameter as the denominator. Multiphase CT imaging of the brain was performed following IV bolus contrast injection. Subsequent parametric perfusion maps were calculated using RAPID software. CONTRAST:  100 mL Omnipaque 350. COMPARISON:  Plain head CT 1952 hours today. FINDINGS: CT Brain Perfusion Findings: ASPECTS: 8. CBF (<30%) Volume: 4mL Perfusion (Tmax>6.0s) volume: 40mL (hypoperfusion index 0.2). Mismatch Volume: 36mL Infarction Location:Left MCA operculum and subjacent white matter. CTA NECK Skeleton:  Occasional dental caries on the right. No acute osseous abnormality identified. Upper chest: There are scattered bilateral lung nodules in the upper lobes and visible superior segment lower lobes. Some of these demonstrate early cavitation (series 1, image 140. The largest visible area of involvement is at least 2 cm in the superior segment of the right lower lobe. No pleural effusion. No superior mediastinal lymphadenopathy. Visible central pulmonary arteries are patent. Other neck: Negative. Aortic arch: 3 vessel arch configuration with no arch atherosclerosis. Incidental small venous collaterals are enhancing in the superior mediastinum prevascular space. Grossly normal visible SVC. Right carotid system: Negative. Left carotid system: Negative. Vertebral arteries: Normal proximal right subclavian artery and right vertebral artery origin. Patent and normal right vertebral to the skull base. Normal proximal left subclavian artery and left vertebral artery origin. Patent and normal left vertebral artery to the skull base. CTA HEAD Posterior circulation: Patent and normal vertebral artery V4 segments. Patent vertebrobasilar junction and basilar artery without stenosis. Patent SCA and PCA origins. Posterior communicating arteries are diminutive or absent. Bilateral PCA branches are within normal limits. Anterior circulation: Both ICA siphons are patent. No siphon plaque or stenosis. Patent carotid termini, MCA and ACA origins. Anterior communicating artery and bilateral ACA branches are within normal limits. Right MCA M1 segment and bifurcation are patent without stenosis. Right MCA branches appear within normal limits. The left MCA M1 segment and bifurcation are patent. There is of very proximal left M3 branch occlusion identified on series 10, image 30 and on axial series 5 images 102-100. This is at the level of the mid insula. No  other left MCA branch occlusion is identified. Venous sinuses: Patent, the right  transverse and sigmoid sinuses appear dominant. Anatomic variants: None. Review of the MIP images confirms the above findings IMPRESSION: 1. Positive for a Left MCA proximal M3 brandch occlusion as seen on CTA series 10, image 30. 2. Positive also for multiple bilateral upper lung nodules, some with early cavitation. Consider Septic Emboli. 3. The above was discussed by telephone with Dr. Arther Dames on 04/01/2019 at 2007 hours. 4. CTP detects a very small 4 mL core infarct with 40 mL of Left MCA territory penumbra. 5. No atherosclerosis or large vessel abnormality identified. Electronically Signed   By: Odessa Fleming M.D.   On: 04/01/2019 20:20   CT HEAD WO CONTRAST  Result Date: 04/01/2019 CLINICAL DATA:  29 year old female code stroke presentation tonight with left MCA proximal M3 occlusion. Now status post endovascular revascularization. EXAM: CT HEAD WITHOUT CONTRAST TECHNIQUE: Contiguous axial images were obtained from the base of the skull through the vertex without intravenous contrast. COMPARISON:  Plain head CT 1952 hours tonight. CTA and CTP earlier tonight. FINDINGS: Brain: No intracranial mass effect or ventriculomegaly. Some intravascular contrast is present, and mild contrast staining/stasis is suspected in the left MCA territory (series 3, image 15). There is only minimal left middle frontal gyrus white matter hypodensity noted, with no cortical cytotoxic edema evident. Basilar cisterns remain normal. Stable normal gray-white matter differentiation in the right hemisphere and posterior fossa. Vascular: New distal cervical left ICA vascular stent which continues into the proximal petrous segment (Series 4, image 15). Skull: No acute osseous abnormality identified. Sinuses/Orbits: New paranasal sinus and nasal cavity fluid and fluid levels. Tympanic cavities and mastoids remain clear. Other: There is an enteric tube looped in the nasopharynx (series 4, image 5). No acute orbit or scalp soft tissue  finding. IMPRESSION: 1. Enteric tube is looped in the nasopharynx, recommend repositioning. 2. No intracranial mass effect or hemorrhage. Left MCA territory contrast staining/stasis is suspected. 3. Minimal ischemia is evident; only subtle left frontal lobe white matter hypodensity is identified. 4. New distal cervical Left ICA vascular stent extending into the proximal petrous segment. Electronically Signed   By: Odessa Fleming M.D.   On: 04/01/2019 23:30   CT ANGIO NECK W OR WO CONTRAST  Result Date: 04/01/2019 CLINICAL DATA:  29 year old female code stroke presentation with suspicion of left side LVO. EXAM: CT ANGIOGRAPHY HEAD AND NECK CT PERFUSION BRAIN TECHNIQUE: Multidetector CT imaging of the head and neck was performed using the standard protocol during bolus administration of intravenous contrast. Multiplanar CT image reconstructions and MIPs were obtained to evaluate the vascular anatomy. Carotid stenosis measurements (when applicable) are obtained utilizing NASCET criteria, using the distal internal carotid diameter as the denominator. Multiphase CT imaging of the brain was performed following IV bolus contrast injection. Subsequent parametric perfusion maps were calculated using RAPID software. CONTRAST:  100 mL Omnipaque 350. COMPARISON:  Plain head CT 1952 hours today. FINDINGS: CT Brain Perfusion Findings: ASPECTS: 8. CBF (<30%) Volume: 11mL Perfusion (Tmax>6.0s) volume: 60mL (hypoperfusion index 0.2). Mismatch Volume: 4mL Infarction Location:Left MCA operculum and subjacent white matter. CTA NECK Skeleton: Occasional dental caries on the right. No acute osseous abnormality identified. Upper chest: There are scattered bilateral lung nodules in the upper lobes and visible superior segment lower lobes. Some of these demonstrate early cavitation (series 1, image 140. The largest visible area of involvement is at least 2 cm in the superior segment of the right lower lobe. No  pleural effusion. No superior  mediastinal lymphadenopathy. Visible central pulmonary arteries are patent. Other neck: Negative. Aortic arch: 3 vessel arch configuration with no arch atherosclerosis. Incidental small venous collaterals are enhancing in the superior mediastinum prevascular space. Grossly normal visible SVC. Right carotid system: Negative. Left carotid system: Negative. Vertebral arteries: Normal proximal right subclavian artery and right vertebral artery origin. Patent and normal right vertebral to the skull base. Normal proximal left subclavian artery and left vertebral artery origin. Patent and normal left vertebral artery to the skull base. CTA HEAD Posterior circulation: Patent and normal vertebral artery V4 segments. Patent vertebrobasilar junction and basilar artery without stenosis. Patent SCA and PCA origins. Posterior communicating arteries are diminutive or absent. Bilateral PCA branches are within normal limits. Anterior circulation: Both ICA siphons are patent. No siphon plaque or stenosis. Patent carotid termini, MCA and ACA origins. Anterior communicating artery and bilateral ACA branches are within normal limits. Right MCA M1 segment and bifurcation are patent without stenosis. Right MCA branches appear within normal limits. The left MCA M1 segment and bifurcation are patent. There is of very proximal left M3 branch occlusion identified on series 10, image 30 and on axial series 5 images 102-100. This is at the level of the mid insula. No other left MCA branch occlusion is identified. Venous sinuses: Patent, the right transverse and sigmoid sinuses appear dominant. Anatomic variants: None. Review of the MIP images confirms the above findings IMPRESSION: 1. Positive for a Left MCA proximal M3 brandch occlusion as seen on CTA series 10, image 30. 2. Positive also for multiple bilateral upper lung nodules, some with early cavitation. Consider Septic Emboli. 3. The above was discussed by telephone with Dr. Arther Dames on 04/01/2019 at 2007 hours. 4. CTP detects a very small 4 mL core infarct with 40 mL of Left MCA territory penumbra. 5. No atherosclerosis or large vessel abnormality identified. Electronically Signed   By: Odessa Fleming M.D.   On: 04/01/2019 20:20   CT CEREBRAL PERFUSION W CONTRAST  Result Date: 04/01/2019 CLINICAL DATA:  29 year old female code stroke presentation with suspicion of left side LVO. EXAM: CT ANGIOGRAPHY HEAD AND NECK CT PERFUSION BRAIN TECHNIQUE: Multidetector CT imaging of the head and neck was performed using the standard protocol during bolus administration of intravenous contrast. Multiplanar CT image reconstructions and MIPs were obtained to evaluate the vascular anatomy. Carotid stenosis measurements (when applicable) are obtained utilizing NASCET criteria, using the distal internal carotid diameter as the denominator. Multiphase CT imaging of the brain was performed following IV bolus contrast injection. Subsequent parametric perfusion maps were calculated using RAPID software. CONTRAST:  100 mL Omnipaque 350. COMPARISON:  Plain head CT 1952 hours today. FINDINGS: CT Brain Perfusion Findings: ASPECTS: 8. CBF (<30%) Volume: 79mL Perfusion (Tmax>6.0s) volume: 75mL (hypoperfusion index 0.2). Mismatch Volume: 14mL Infarction Location:Left MCA operculum and subjacent white matter. CTA NECK Skeleton: Occasional dental caries on the right. No acute osseous abnormality identified. Upper chest: There are scattered bilateral lung nodules in the upper lobes and visible superior segment lower lobes. Some of these demonstrate early cavitation (series 1, image 140. The largest visible area of involvement is at least 2 cm in the superior segment of the right lower lobe. No pleural effusion. No superior mediastinal lymphadenopathy. Visible central pulmonary arteries are patent. Other neck: Negative. Aortic arch: 3 vessel arch configuration with no arch atherosclerosis. Incidental small venous collaterals  are enhancing in the superior mediastinum prevascular space. Grossly normal visible SVC. Right carotid system: Negative.  Left carotid system: Negative. Vertebral arteries: Normal proximal right subclavian artery and right vertebral artery origin. Patent and normal right vertebral to the skull base. Normal proximal left subclavian artery and left vertebral artery origin. Patent and normal left vertebral artery to the skull base. CTA HEAD Posterior circulation: Patent and normal vertebral artery V4 segments. Patent vertebrobasilar junction and basilar artery without stenosis. Patent SCA and PCA origins. Posterior communicating arteries are diminutive or absent. Bilateral PCA branches are within normal limits. Anterior circulation: Both ICA siphons are patent. No siphon plaque or stenosis. Patent carotid termini, MCA and ACA origins. Anterior communicating artery and bilateral ACA branches are within normal limits. Right MCA M1 segment and bifurcation are patent without stenosis. Right MCA branches appear within normal limits. The left MCA M1 segment and bifurcation are patent. There is of very proximal left M3 branch occlusion identified on series 10, image 30 and on axial series 5 images 102-100. This is at the level of the mid insula. No other left MCA branch occlusion is identified. Venous sinuses: Patent, the right transverse and sigmoid sinuses appear dominant. Anatomic variants: None. Review of the MIP images confirms the above findings IMPRESSION: 1. Positive for a Left MCA proximal M3 brandch occlusion as seen on CTA series 10, image 30. 2. Positive also for multiple bilateral upper lung nodules, some with early cavitation. Consider Septic Emboli. 3. The above was discussed by telephone with Dr. Samara Snide on 04/01/2019 at 2007 hours. 4. CTP detects a very small 4 mL core infarct with 40 mL of Left MCA territory penumbra. 5. No atherosclerosis or large vessel abnormality identified. Electronically Signed    By: Genevie Ann M.D.   On: 04/01/2019 20:20   Portable Chest x-ray  Result Date: 04/02/2019 CLINICAL DATA:  ET tube EXAM: PORTABLE CHEST 1 VIEW COMPARISON:  None. FINDINGS: ET tube is 3.4 cm above the carina. NG tube is in the stomach. Heart is normal size. Minimal left base atelectasis. Right lung clear. No effusions. No acute bony abnormality. IMPRESSION: Minimal left base atelectasis. Electronically Signed   By: Rolm Baptise M.D.   On: 04/02/2019 00:58   DG Abd Portable 1V  Result Date: 04/02/2019 CLINICAL DATA:  Orogastric tube placement. EXAM: PORTABLE ABDOMEN - 1 VIEW COMPARISON:  No recent. FINDINGS: Orogastric tube tip and side hole noted over the stomach. Nonspecific air-filled loops of small bowel noted. Colonic gas pattern is unremarkable. No free air. No acute bony abnormality. IMPRESSION: Orogastric tube tip and side hole noted over the stomach. Electronically Signed   By: Marcello Moores  Register   On: 04/02/2019 06:04   CT HEAD CODE STROKE WO CONTRAST  Result Date: 04/01/2019 CLINICAL DATA:  Code stroke. 29 year old female with right facial droop and aphasia. EXAM: CT HEAD WITHOUT CONTRAST TECHNIQUE: Contiguous axial images were obtained from the base of the skull through the vertex without intravenous contrast. COMPARISON:  None. FINDINGS: Brain: Normal cerebral volume. No acute intracranial hemorrhage identified. No midline shift, mass effect, or evidence of intracranial mass lesion. No ventriculomegaly. Asymmetric hypodensity at the left insula on series 2, image 15. But no other cytotoxic edema identified in the left hemisphere. There is perhaps subtle asymmetric white matter hypodensity such as in the left middle frontal gyrus on coronal image 33. Normal gray-white matter differentiation in the right hemisphere and the posterior fossa. Vascular: No suspicious intracranial vascular hyperdensity. Skull: Negative. Sinuses/Orbits: Visualized paranasal sinuses and mastoids are clear. Other: Leftward  gaze. Visualized scalp soft tissues are within normal  limits. ASPECTS [facility] Stroke Program Early CT Score) - Ganglionic level infarction (caudate, lentiform nuclei, internal capsule, insula, M1-M3 cortex): 6 - Supraganglionic infarction (M4-M6 cortex): 2 Total score (0-10 with 10 being normal): 8 IMPRESSION: 1. Leftward gaze deviation with mild hypodensity in the left insula, and also perhaps involving some white matter in the middle frontal gyrus. ASPECTS 8. No associated hemorrhage or mass effect. 2. These results were communicated to Dr. Laurence Slate at 8:01 pm on 04/01/2019 by text page via the [facility] messaging system. Electronically Signed   By: Odessa Fleming M.D.   On: 04/01/2019 20:04    Labs:  CBC: Recent Labs    04/01/19 1943 04/01/19 2003 04/02/19 0013 04/02/19 0032  WBC 19.5*  --  21.4*  --   HGB 11.9* 12.6 11.2* 12.2  HCT 35.3* 37.0 32.5* 36.0  PLT 285  --  254  --     COAGS: Recent Labs    04/01/19 1943  INR 1.5*  APTT 31    BMP: Recent Labs    04/01/19 1943 04/01/19 2003 04/02/19 0013 04/02/19 0032  NA 129* 127* 130* 132*  K 4.5 4.0 3.8 3.7  CL 93* 92* 95*  --   CO2 24  --  24  --   GLUCOSE 118* 118* 133*  --   BUN 6 9 5*  --   CALCIUM 8.5*  --  7.8*  --   CREATININE 0.76 0.70 0.64  --   GFRNONAA >60  --  >60  --   GFRAA >60  --  >60  --     LIVER FUNCTION TESTS: Recent Labs    04/01/19 1943  BILITOT 0.9  AST 63*  ALT 40  ALKPHOS 112  PROT 7.0  ALBUMIN 2.3*    Assessment and Plan: L MCA occlusion s/p thrombectomy and pipeline flow diverter placement at the site of focal dissection associated with 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  Active oozing noted from R groin with large hematoma suspicious for large oozing pseudoaneurysm vs. Active hemorrhage.  Pressure held by Dr. Corliss Skains x15 minutes, then resumed by this PA and RN for additional 45 minutes.  Plan for non-contrast CT Pelvis. Vascular US right  groin Type and screen.  Repeat CBC Obtain P2Y12 Hold Brtilinta until P2Y12 returned.  OK to give aspirin now.   Question from RN re: placement of G-tube. KUB reviewed with Dr. Deanne Coffer.  Tip of tube is in the stomach.   Groin improved after 1 hr of holding pressure.  Compressible, soft in more lateral aspects, but there remains an area of possible pseudoaneurysm adjacent to puncture site.  Femoral pulse palpable. Distal DP on R foot palpable. Pressure dressing applied.   Electronically Signed: Hoyt Koch, PA 04/02/2019, 9:55 AM   I spent a total of 15 Minutes at the the patient's bedside AND on the patient's hospital floor or unit, greater than 50% of which was counseling/coordinating care for L MCA occlusion

## 2019-04-02 NOTE — Procedures (Addendum)
Central Venous Catheter Insertion Procedure Note Sonni Teghan Philbin [id number] December 05, 1990  Procedure: Insertion of Central Venous Catheter Indications: Assessment of intravascular volume, Drug and/or fluid administration and Frequent blood sampling  Procedure Details Consent: Unable to obtain consent because of altered level of consciousness. Time Out: Verified patient identification, verified procedure, site/side was marked, verified correct patient position, special equipment/implants available, medications/allergies/relevent history reviewed, required imaging and test results available.  Performed  Maximum sterile technique was used including antiseptics, cap, gloves, gown, hand hygiene, mask and sheet. Skin prep: Chlorhexidine; local anesthetic administered A antimicrobial bonded/coated triple lumen catheter was placed in the left subclavian vein using the Seldinger technique.  Evaluation Blood flow good Complications: No apparent complications Patient did tolerate procedure well. Chest X-ray ordered to verify placement.  CXR: pending.   Rutherford Guys, [place] Sidonie Dickens [facility] 04/02/2019, 10:35 PM   Procedure and CXR reviewed, catheter with good placement, no complications

## 2019-04-02 NOTE — Progress Notes (Signed)
Pharmacy Antibiotic Note  Lauren Reyes is a 29 y.o. female admitted on 04/01/2019 with new CVA and incidental findings concerning for pulmonary septic emboli. Pharmacy has been consulted for Vancomycin + Zosyn dosing for rule out endocarditis.   The patient is revascularization by IR for occluded L-MCA. The patient also had a mildly elevated hCG - early pregnancy needs to be evaluated for.   SCr 0.7, CrCl~80-100 ml/min.   Plan: - Vancomycin 1500 mg IV x 1 followed by 1g IV every 12 hours - Zosyn 3.375g IV every 8 hours - Will continue to follow renal function, culture results, LOT, and antibiotic de-escalation plans    Height: 5\' 5"  (165.1 cm) Weight: 159 lb 6.3 oz (72.3 kg) IBW/kg (Calculated) : 57  Temp (24hrs), Avg:98.2 F (36.8 C), Min:98.2 F (36.8 C), Max:98.2 F (36.8 C)  Recent Labs  Lab 04/01/19 1943 04/01/19 2003  WBC 19.5*  --   CREATININE 0.76 0.70    Estimated Creatinine Clearance: 102.4 mL/min (by C-G formula based on SCr of 0.7 mg/dL).    Not on File  Antimicrobials this admission: Vanc 3/25 >> Zosyn 3/25 >>  Dose adjustments this admission: n/a  Microbiology results: 3/24 COVID/Flu >> neg 3/25 BCx >> 3/25 RCx >> 3/25 MRSA PCR >>  Thank you for allowing pharmacy to be a part of this patient's care.  4/25, PharmD, BCPS Clinical Pharmacist 04/02/2019 1:01 AM   **Pharmacist phone directory can now be found on [email or web address] ([email or web address]).  Listed under [facility].

## 2019-04-02 NOTE — Progress Notes (Signed)
eLink Physician-Brief Progress Note Patient Name: Lauren Reyes DOB: 18-Feb-1990 MRN: [id number]   Date of Service  04/02/2019  HPI/Events of Note  Blood pressure lower than neurology goal blood pressure,  Pt has had 2000 + ml of iv fluids  eICU Interventions  Phenylephrine infusion ordered for BP support.        Thomasene Lot Nazair Fortenberry 04/02/2019, 4:41 AM

## 2019-04-02 NOTE — H&P (View-Only) (Signed)
NAME:  Lauren Reyes, MRN:  [id number], DOB:  Nov 06, 1990, LOS: 1 ADMISSION DATE:  04/01/2019, CONSULTATION DATE: 04/02/2019 REFERRING MD: Corliss Skains, CHIEF COMPLAINT: CVA  Brief History   29 year old with sudden onset right-sided weakness and left MCA occlusion  History of present illness   Patient is a 29 year old female with suspected IV drug abuse who was being interviewed by police tonight and developed left-sided weakness in her face.  She was brought by EMS to the emergency room work-up revealed occluded left MCA.  We are reviewing her case while in the neuro ICU status post revascularization per interventional radiology.  The patient is ventilated.  Does have multiple scars over her left arm.  Is currently on propofol with a flat neurologic exam although she does have pupillary reflex.  Patient has had a set of blood cultures sent although not been started on antibiotics.  Toxicology drug screen is pending.  White count is 19,000.  CT of the head and neck does show multiple small cavitary abscesses in the upper lobes bilaterally.  She does have a systolic murmur.  Echocardiogram has been performed although has not been read.  Past Medical History  No available previous medical history  Significant Hospital Events   NA Consults:  PCCM, interventional radiology  Procedures:  Status post revascularization  Significant Diagnostic Tests:  CT scan of the brain along with perfusion scan  Micro Data:  Blood culture sent  Antimicrobials:  Empiric vancomycin and Zosyn for possible endocarditis  Interim history/subjective:  NA  Objective   Blood pressure (!) 146/106, pulse (!) 142, temperature 98.2 F (36.8 C), temperature source Axillary, resp. rate (!) 0, height 5\' 5"  (1.651 m), weight 72.3 kg, SpO2 100 %.    Vent Mode: PRVC FiO2 (%):  [100 %] 100 % Set Rate:  [16 bmp] 16 bmp Vt Set:  [450 mL] 450 mL PEEP:  [5 cmH20] 5 cmH20 Plateau Pressure:  [17 cmH20] 17 cmH20    Intake/Output Summary (Last 24 hours) at 04/02/2019 0050 Last data filed at 04/01/2019 2242 Gross per 24 hour  Intake 700 ml  Output 200 ml  Net 500 ml   Filed Weights   04/01/19 1900 04/01/19 2023  Weight: 72.3 kg 72.3 kg    Examination: General: Black female ventilated sedated HENT: Intubated otherwise normal Lungs: Clear Cardiovascular: 2/6 systolic ejection murmur Abdomen: Soft benign Extremities: Within normal limits, scars of her left arm. Neuro: Sedated GU: N/A  Resolved Hospital Problem list   NA  Assessment & Plan:  1.  CVA status post revascularization: With findings in upper lobes consisting of multiple small abscesses this seems most likely secondary to septic emboli due to drug IV drug abuse.  We are going to replant another set of cultures start her on vancomycin and Zosyn.  2.  Probable history of IV drug abuse: Monitor  3.  Status post CVA: Per interventional radiology and monitor  4.  Equivocal serum beta-hCG.  Will repeat.  5.  On ventilator will reevaluate in the morning while sedating with propofol.    Best practice:  Diet: N.p.o. Pain/Anxiety/Delirium protocol (if indicated): Conscious sedation VAP protocol (if indicated): Yes DVT prophylaxis: SCDs patient on anticoagulation GI prophylaxis: Pepcid Glucose control: Monitor Mobility: Bedrest Code Status: Full Family Communication: Not available Disposition: Treatment in ICU  Labs   CBC: Recent Labs  Lab 04/01/19 1943 04/01/19 2003 04/02/19 0032  WBC 19.5*  --   --   NEUTROABS 17.0*  --   --  HGB 11.9* 12.6 12.2  HCT 35.3* 37.0 36.0  MCV 75.4*  --   --   PLT 285  --   --     Basic Metabolic Panel: Recent Labs  Lab 04/01/19 1943 04/01/19 2003 04/02/19 0032  NA 129* 127* 132*  K 4.5 4.0 3.7  CL 93* 92*  --   CO2 24  --   --   GLUCOSE 118* 118*  --   BUN 6 9  --   CREATININE 0.76 0.70  --   CALCIUM 8.5*  --   --    GFR: Estimated Creatinine Clearance: 102.4 mL/min (by C-G  formula based on SCr of 0.7 mg/dL). Recent Labs  Lab 04/01/19 1943  WBC 19.5*    Liver Function Tests: Recent Labs  Lab 04/01/19 1943  AST 63*  ALT 40  ALKPHOS 112  BILITOT 0.9  PROT 7.0  ALBUMIN 2.3*   No results for input(s): LIPASE, AMYLASE in the last 168 hours. No results for input(s): AMMONIA in the last 168 hours.  ABG    Component Value Date/Time   PHART 7.347 (L) 04/02/2019 0032   PCO2ART 55.7 (H) 04/02/2019 0032   PO2ART 523.0 (H) 04/02/2019 0032   HCO3 30.5 (H) 04/02/2019 0032   TCO2 32 04/02/2019 0032   O2SAT 100.0 04/02/2019 0032     Coagulation Profile: Recent Labs  Lab 04/01/19 1943  INR 1.5*    Cardiac Enzymes: No results for input(s): CKTOTAL, CKMB, CKMBINDEX, TROPONINI in the last 168 hours.  HbA1C: No results found for: HGBA1C  CBG: Recent Labs  Lab 04/01/19 1947  GLUCAP 124*    Review of Systems:   Unable to obtain patient sedated and on ventilator  Past Medical History  She,  has no past medical history on file.   Surgical History   History reviewed. No pertinent surgical history.   Social History      Family History   Her family history is not on file.   Allergies Not on File   Home Medications  Prior to Admission medications   Not on File     Critical care time: Over 35 minutes spent in bedside evaluation chart review critical care planning

## 2019-04-02 NOTE — Progress Notes (Signed)
SLP Cancellation Note  Patient Details Name: Lauren Reyes MRN: [id number] DOB: [birth year]   Cancelled treatment:       Reason Eval/Treat Not Completed: Patient not medically ready (on vent). Will f/u as able.    Mahala Menghini., M.A. CCC-SLP Acute Rehabilitation Services Pager [phone] Office [phone]  04/02/2019, 8:17 AM

## 2019-04-02 NOTE — Progress Notes (Addendum)
At [phone] Dr. Corliss Skains came to examine patient and the femoral site was oozing with a large hematoma extending to the labia.  Hematoma was marked and pressure held for 30 minutes in the room and as the patient was transported to and from CT.  A stat CT and stat vascular ultrasound were obtained. See Results for impressions.  Verbal order to hold Brillinta until P2Y12 results available.  Patient is to remain flat overnight and will be re-examined by Dr. Corliss Skains in the morning.

## 2019-04-02 NOTE — Progress Notes (Signed)
STROKE TEAM PROGRESS NOTE   INTERVAL HISTORY RN at bedside. Pt still intubated on fentanyl and precedex. She is able to open eyes on repetitive voice and tactile stimulation but not following commands. Still has right sided weakness but able to briefly lift up RUE against gravity. RLE straight with large hematoma at right groin area. BUEs showed multiple needle marks. LE venous doppler showed right acute DVT, will need IR for IVC filter. Pt started to have fever and CT chest showed multiple lung nodule concerning for multifocal pneumonia, septic emboli or metastasis. Now on cefepime and vanco. Blood culture pending. Pt has no contact person on file, but she did have a home address on her license, will ask SW to work on that.   Vitals:   04/02/19 0745 04/02/19 0800 04/02/19 0900 04/02/19 1000  BP:  127/75 118/68 118/67  Pulse:  90 96 77  Resp:  19 (!) 9 (!) 8  Temp: (!) 101.4 F (38.6 C)     TempSrc:      SpO2:  100% 100% 100%  Weight:      Height:        CBC:  Recent Labs  Lab 04/01/19 1943 04/01/19 2003 04/02/19 0013 04/02/19 0032  WBC 19.5*  --  21.4*  --   NEUTROABS 17.0*  --  18.4*  --   HGB 11.9*   < > 11.2* 12.2  HCT 35.3*   < > 32.5* 36.0  MCV 75.4*  --  75.6*  --   PLT 285  --  254  --    < > = values in this interval not displayed.    Basic Metabolic Panel:  Recent Labs  Lab 04/01/19 1943 04/01/19 1943 04/01/19 2003 04/01/19 2003 04/02/19 0013 04/02/19 0032 04/02/19 0159  NA 129*   < > 127*   < > 130* 132*  --   K 4.5   < > 4.0   < > 3.8 3.7  --   CL 93*   < > 92*  --  95*  --   --   CO2 24  --   --   --  24  --   --   GLUCOSE 118*   < > 118*  --  133*  --   --   BUN 6   < > 9  --  5*  --   --   CREATININE 0.76   < > 0.70  --  0.64  --   --   CALCIUM 8.5*  --   --   --  7.8*  --   --   MG  --   --   --   --   --   --  1.9   < > = values in this interval not displayed.   Lipid Panel:     Component Value Date/Time   CHOL 163 04/02/2019 0012   TRIG 258  (H) 04/02/2019 0012   HDL <10 (L) 04/02/2019 0012   CHOLHDL NOT CALCULATED 04/02/2019 0012   VLDL 52 (H) 04/02/2019 0012   LDLCALC NOT CALCULATED 04/02/2019 0012   HgbA1c:  Lab Results  Component Value Date   HGBA1C 5.9 (H) 04/02/2019   Urine Drug Screen:     Component Value Date/Time   LABOPIA NONE DETECTED 04/01/2019 2349   COCAINSCRNUR NONE DETECTED 04/01/2019 2349   LABBENZ NONE DETECTED 04/01/2019 2349   AMPHETMU POSITIVE (A) 04/01/2019 2349   THCU POSITIVE (A) 04/01/2019 2349   LABBARB  NONE DETECTED 04/01/2019 2349    Alcohol Level No results found for: ETH  IMAGING past 24 hours CT ANGIO HEAD W OR WO CONTRAST  Result Date: 04/01/2019 CLINICAL DATA:  29 year old female code stroke presentation with suspicion of left side LVO. EXAM: CT ANGIOGRAPHY HEAD AND NECK CT PERFUSION BRAIN TECHNIQUE: Multidetector CT imaging of the head and neck was performed using the standard protocol during bolus administration of intravenous contrast. Multiplanar CT image reconstructions and MIPs were obtained to evaluate the vascular anatomy. Carotid stenosis measurements (when applicable) are obtained utilizing NASCET criteria, using the distal internal carotid diameter as the denominator. Multiphase CT imaging of the brain was performed following IV bolus contrast injection. Subsequent parametric perfusion maps were calculated using RAPID software. CONTRAST:  100 mL Omnipaque 350. COMPARISON:  Plain head CT 1952 hours today. FINDINGS: CT Brain Perfusion Findings: ASPECTS: 8. CBF (<30%) Volume: 56mL Perfusion (Tmax>6.0s) volume: 72mL (hypoperfusion index 0.2). Mismatch Volume: 16mL Infarction Location:Left MCA operculum and subjacent white matter. CTA NECK Skeleton: Occasional dental caries on the right. No acute osseous abnormality identified. Upper chest: There are scattered bilateral lung nodules in the upper lobes and visible superior segment lower lobes. Some of these demonstrate early cavitation  (series 1, image 140. The largest visible area of involvement is at least 2 cm in the superior segment of the right lower lobe. No pleural effusion. No superior mediastinal lymphadenopathy. Visible central pulmonary arteries are patent. Other neck: Negative. Aortic arch: 3 vessel arch configuration with no arch atherosclerosis. Incidental small venous collaterals are enhancing in the superior mediastinum prevascular space. Grossly normal visible SVC. Right carotid system: Negative. Left carotid system: Negative. Vertebral arteries: Normal proximal right subclavian artery and right vertebral artery origin. Patent and normal right vertebral to the skull base. Normal proximal left subclavian artery and left vertebral artery origin. Patent and normal left vertebral artery to the skull base. CTA HEAD Posterior circulation: Patent and normal vertebral artery V4 segments. Patent vertebrobasilar junction and basilar artery without stenosis. Patent SCA and PCA origins. Posterior communicating arteries are diminutive or absent. Bilateral PCA branches are within normal limits. Anterior circulation: Both ICA siphons are patent. No siphon plaque or stenosis. Patent carotid termini, MCA and ACA origins. Anterior communicating artery and bilateral ACA branches are within normal limits. Right MCA M1 segment and bifurcation are patent without stenosis. Right MCA branches appear within normal limits. The left MCA M1 segment and bifurcation are patent. There is of very proximal left M3 branch occlusion identified on series 10, image 30 and on axial series 5 images 102-100. This is at the level of the mid insula. No other left MCA branch occlusion is identified. Venous sinuses: Patent, the right transverse and sigmoid sinuses appear dominant. Anatomic variants: None. Review of the MIP images confirms the above findings IMPRESSION: 1. Positive for a Left MCA proximal M3 brandch occlusion as seen on CTA series 10, image 30. 2. Positive  also for multiple bilateral upper lung nodules, some with early cavitation. Consider Septic Emboli. 3. The above was discussed by telephone with Dr. Arther Dames on 04/01/2019 at 2007 hours. 4. CTP detects a very small 4 mL core infarct with 40 mL of Left MCA territory penumbra. 5. No atherosclerosis or large vessel abnormality identified. Electronically Signed   By: Odessa Fleming M.D.   On: 04/01/2019 20:20   CT HEAD WO CONTRAST  Result Date: 04/01/2019 CLINICAL DATA:  29 year old female code stroke presentation tonight with left MCA proximal M3 occlusion. Now status  post endovascular revascularization. EXAM: CT HEAD WITHOUT CONTRAST TECHNIQUE: Contiguous axial images were obtained from the base of the skull through the vertex without intravenous contrast. COMPARISON:  Plain head CT 1952 hours tonight. CTA and CTP earlier tonight. FINDINGS: Brain: No intracranial mass effect or ventriculomegaly. Some intravascular contrast is present, and mild contrast staining/stasis is suspected in the left MCA territory (series 3, image 15). There is only minimal left middle frontal gyrus white matter hypodensity noted, with no cortical cytotoxic edema evident. Basilar cisterns remain normal. Stable normal gray-white matter differentiation in the right hemisphere and posterior fossa. Vascular: New distal cervical left ICA vascular stent which continues into the proximal petrous segment (Series 4, image 15). Skull: No acute osseous abnormality identified. Sinuses/Orbits: New paranasal sinus and nasal cavity fluid and fluid levels. Tympanic cavities and mastoids remain clear. Other: There is an enteric tube looped in the nasopharynx (series 4, image 5). No acute orbit or scalp soft tissue finding. IMPRESSION: 1. Enteric tube is looped in the nasopharynx, recommend repositioning. 2. No intracranial mass effect or hemorrhage. Left MCA territory contrast staining/stasis is suspected. 3. Minimal ischemia is evident; only subtle left  frontal lobe white matter hypodensity is identified. 4. New distal cervical Left ICA vascular stent extending into the proximal petrous segment. Electronically Signed   By: Odessa Fleming M.D.   On: 04/01/2019 23:30   CT ANGIO NECK W OR WO CONTRAST  Result Date: 04/01/2019 CLINICAL DATA:  29 year old female code stroke presentation with suspicion of left side LVO. EXAM: CT ANGIOGRAPHY HEAD AND NECK CT PERFUSION BRAIN TECHNIQUE: Multidetector CT imaging of the head and neck was performed using the standard protocol during bolus administration of intravenous contrast. Multiplanar CT image reconstructions and MIPs were obtained to evaluate the vascular anatomy. Carotid stenosis measurements (when applicable) are obtained utilizing NASCET criteria, using the distal internal carotid diameter as the denominator. Multiphase CT imaging of the brain was performed following IV bolus contrast injection. Subsequent parametric perfusion maps were calculated using RAPID software. CONTRAST:  100 mL Omnipaque 350. COMPARISON:  Plain head CT 1952 hours today. FINDINGS: CT Brain Perfusion Findings: ASPECTS: 8. CBF (<30%) Volume: 4mL Perfusion (Tmax>6.0s) volume: 40mL (hypoperfusion index 0.2). Mismatch Volume: 36mL Infarction Location:Left MCA operculum and subjacent white matter. CTA NECK Skeleton: Occasional dental caries on the right. No acute osseous abnormality identified. Upper chest: There are scattered bilateral lung nodules in the upper lobes and visible superior segment lower lobes. Some of these demonstrate early cavitation (series 1, image 140. The largest visible area of involvement is at least 2 cm in the superior segment of the right lower lobe. No pleural effusion. No superior mediastinal lymphadenopathy. Visible central pulmonary arteries are patent. Other neck: Negative. Aortic arch: 3 vessel arch configuration with no arch atherosclerosis. Incidental small venous collaterals are enhancing in the superior mediastinum  prevascular space. Grossly normal visible SVC. Right carotid system: Negative. Left carotid system: Negative. Vertebral arteries: Normal proximal right subclavian artery and right vertebral artery origin. Patent and normal right vertebral to the skull base. Normal proximal left subclavian artery and left vertebral artery origin. Patent and normal left vertebral artery to the skull base. CTA HEAD Posterior circulation: Patent and normal vertebral artery V4 segments. Patent vertebrobasilar junction and basilar artery without stenosis. Patent SCA and PCA origins. Posterior communicating arteries are diminutive or absent. Bilateral PCA branches are within normal limits. Anterior circulation: Both ICA siphons are patent. No siphon plaque or stenosis. Patent carotid termini, MCA and ACA origins. Anterior  communicating artery and bilateral ACA branches are within normal limits. Right MCA M1 segment and bifurcation are patent without stenosis. Right MCA branches appear within normal limits. The left MCA M1 segment and bifurcation are patent. There is of very proximal left M3 branch occlusion identified on series 10, image 30 and on axial series 5 images 102-100. This is at the level of the mid insula. No other left MCA branch occlusion is identified. Venous sinuses: Patent, the right transverse and sigmoid sinuses appear dominant. Anatomic variants: None. Review of the MIP images confirms the above findings IMPRESSION: 1. Positive for a Left MCA proximal M3 brandch occlusion as seen on CTA series 10, image 30. 2. Positive also for multiple bilateral upper lung nodules, some with early cavitation. Consider Septic Emboli. 3. The above was discussed by telephone with Dr. Arther Dames on 04/01/2019 at 2007 hours. 4. CTP detects a very small 4 mL core infarct with 40 mL of Left MCA territory penumbra. 5. No atherosclerosis or large vessel abnormality identified. Electronically Signed   By: Odessa Fleming M.D.   On: 04/01/2019 20:20    CT CHEST WO CONTRAST  Result Date: 04/02/2019 CLINICAL DATA:  Abnormal x-ray, lung nodule follow-up. EXAM: CT CHEST WITHOUT CONTRAST TECHNIQUE: Multidetector CT imaging of the chest was performed following the standard protocol without IV contrast. COMPARISON:  None. FINDINGS: Cardiovascular: Heart size mildly enlarged with low-attenuation cardiac chambers, no sign of pericardial effusion. Aorta is of normal caliber. Central pulmonary vasculature is normal on noncontrast imaging. Mediastinum/Nodes: Endotracheal tube terminates in the mid trachea. Gastric tube in the stomach. Triangular marbled fatty and soft tissue density in the anterior mediastinum compatible with residual thymic tissue no sign of adenopathy Lungs/Pleura: Multifocal nodularity in the chest. Largest areas in the superior segment of the right lower lobe. (Image 66, series 4) 2 x 1.9 cm. Another smaller area on image 73 measuring 1.6 cm. Similar smaller nodules showing more central soft tissue attenuation and ill-defined ground-glass margins scattered about the chest involving upper and lower lobes. Small nodule in the left lung base (image 90, series 4) 10 mm. (Image 46, series 4) superior segment left lower lobe 8 mm. 7 mm nodule in the lingula on image 92 of series 4. Upper Abdomen: Heterogeneous appearance of the nephrogram bilaterally in the setting of recent angiography and thrombectomy. Musculoskeletal: No acute bone finding or destructive bone process. IMPRESSION: 1. Multifocal nodularity in both lungs, largest areas in the superior segment of the right lower lobe measuring 2 x 1.9 cm. Similar smaller nodules showing more central soft tissue attenuation and ill-defined ground-glass margins scattered about the chest. Differential diagnosis includes multifocal pneumonia and would include viral or atypical processes, even COVID-19 infection given the peripheral nature of many of these nodules. Septic emboli and metastatic disease are also  considered, close follow-up is suggested. 2. Mild cardiomegaly. 3. Heterogeneous appearance of the nephrogram bilaterally in the setting of recent angiography and thrombectomy. Continued correlation with renal function is suggested. 4. Endotracheal tube terminates in the mid trachea. Gastric tube in the stomach. 5. Triangular marbled fatty and soft tissue density in the anterior mediastinum compatible with residual thymic tissue. Electronically Signed   By: Donzetta Kohut M.D.   On: 04/02/2019 10:03   CT PELVIS WO CONTRAST  Result Date: 04/02/2019 CLINICAL DATA:  Recent stroke intervention. Evaluate for a retroperitoneal hematoma. EXAM: CT PELVIS WITHOUT CONTRAST TECHNIQUE: Multidetector CT imaging of the pelvis was performed following the standard protocol without intravenous contrast. COMPARISON:  None. FINDINGS: Urinary Tract: Kidneys not visualized on this examination. Urinary bladder is moderately distended with iodinated contrast. Bowel:  Visualized bowel structures are unremarkable. Vascular/Lymphatic: Extensive subcutaneous edema with high-density material throughout the anterior right groin and upper right thigh. Findings are compatible with blood products and a diffuse hematoma. Limited evaluation of the vascular structures in the right groin without iodinated contrast. No evidence for vascular calcifications. Reproductive: Normal appearance of the uterus. The ovaries and adnexal tissue are mildly prominent but probably normal for age. Other: Extensive hematoma involving the anterior upper right thigh which extends into the medial right thigh. There is no significant hematoma extending into the intra-abdominal cavity or retroperitoneal space. No evidence for ascites. Musculoskeletal: No acute bone abnormality. IMPRESSION: 1. Large diffuse hematoma involving the anterior right groin and upper right thigh. Hematoma does not extend into the intra-abdominal retroperitoneal structures. 2. Limited evaluation  for a post catheterization pseudoaneurysm on this non contrast examination. Recommend correlation with vascular ultrasound or CTA. Electronically Signed   By: Richarda Overlie M.D.   On: 04/02/2019 10:13   CT CEREBRAL PERFUSION W CONTRAST  Result Date: 04/01/2019 CLINICAL DATA:  29 year old female code stroke presentation with suspicion of left side LVO. EXAM: CT ANGIOGRAPHY HEAD AND NECK CT PERFUSION BRAIN TECHNIQUE: Multidetector CT imaging of the head and neck was performed using the standard protocol during bolus administration of intravenous contrast. Multiplanar CT image reconstructions and MIPs were obtained to evaluate the vascular anatomy. Carotid stenosis measurements (when applicable) are obtained utilizing NASCET criteria, using the distal internal carotid diameter as the denominator. Multiphase CT imaging of the brain was performed following IV bolus contrast injection. Subsequent parametric perfusion maps were calculated using RAPID software. CONTRAST:  100 mL Omnipaque 350. COMPARISON:  Plain head CT 1952 hours today. FINDINGS: CT Brain Perfusion Findings: ASPECTS: 8. CBF (<30%) Volume: 4mL Perfusion (Tmax>6.0s) volume: 40mL (hypoperfusion index 0.2). Mismatch Volume: 36mL Infarction Location:Left MCA operculum and subjacent white matter. CTA NECK Skeleton: Occasional dental caries on the right. No acute osseous abnormality identified. Upper chest: There are scattered bilateral lung nodules in the upper lobes and visible superior segment lower lobes. Some of these demonstrate early cavitation (series 1, image 140. The largest visible area of involvement is at least 2 cm in the superior segment of the right lower lobe. No pleural effusion. No superior mediastinal lymphadenopathy. Visible central pulmonary arteries are patent. Other neck: Negative. Aortic arch: 3 vessel arch configuration with no arch atherosclerosis. Incidental small venous collaterals are enhancing in the superior mediastinum  prevascular space. Grossly normal visible SVC. Right carotid system: Negative. Left carotid system: Negative. Vertebral arteries: Normal proximal right subclavian artery and right vertebral artery origin. Patent and normal right vertebral to the skull base. Normal proximal left subclavian artery and left vertebral artery origin. Patent and normal left vertebral artery to the skull base. CTA HEAD Posterior circulation: Patent and normal vertebral artery V4 segments. Patent vertebrobasilar junction and basilar artery without stenosis. Patent SCA and PCA origins. Posterior communicating arteries are diminutive or absent. Bilateral PCA branches are within normal limits. Anterior circulation: Both ICA siphons are patent. No siphon plaque or stenosis. Patent carotid termini, MCA and ACA origins. Anterior communicating artery and bilateral ACA branches are within normal limits. Right MCA M1 segment and bifurcation are patent without stenosis. Right MCA branches appear within normal limits. The left MCA M1 segment and bifurcation are patent. There is of very proximal left M3 branch occlusion identified on series 10, image 30 and  on axial series 5 images 102-100. This is at the level of the mid insula. No other left MCA branch occlusion is identified. Venous sinuses: Patent, the right transverse and sigmoid sinuses appear dominant. Anatomic variants: None. Review of the MIP images confirms the above findings IMPRESSION: 1. Positive for a Left MCA proximal M3 brandch occlusion as seen on CTA series 10, image 30. 2. Positive also for multiple bilateral upper lung nodules, some with early cavitation. Consider Septic Emboli. 3. The above was discussed by telephone with Dr. Arther Dames on 04/01/2019 at 2007 hours. 4. CTP detects a very small 4 mL core infarct with 40 mL of Left MCA territory penumbra. 5. No atherosclerosis or large vessel abnormality identified. Electronically Signed   By: Odessa Fleming M.D.   On: 04/01/2019 20:20    Portable Chest x-ray  Result Date: 04/02/2019 CLINICAL DATA:  ET tube EXAM: PORTABLE CHEST 1 VIEW COMPARISON:  None. FINDINGS: ET tube is 3.4 cm above the carina. NG tube is in the stomach. Heart is normal size. Minimal left base atelectasis. Right lung clear. No effusions. No acute bony abnormality. IMPRESSION: Minimal left base atelectasis. Electronically Signed   By: Charlett Nose M.D.   On: 04/02/2019 00:58   DG Abd Portable 1V  Result Date: 04/02/2019 CLINICAL DATA:  Orogastric tube placement. EXAM: PORTABLE ABDOMEN - 1 VIEW COMPARISON:  No recent. FINDINGS: Orogastric tube tip and side hole noted over the stomach. Nonspecific air-filled loops of small bowel noted. Colonic gas pattern is unremarkable. No free air. No acute bony abnormality. IMPRESSION: Orogastric tube tip and side hole noted over the stomach. Electronically Signed   By: Maisie Fus  Register   On: 04/02/2019 06:04   CT HEAD CODE STROKE WO CONTRAST  Result Date: 04/01/2019 CLINICAL DATA:  Code stroke. 29 year old female with right facial droop and aphasia. EXAM: CT HEAD WITHOUT CONTRAST TECHNIQUE: Contiguous axial images were obtained from the base of the skull through the vertex without intravenous contrast. COMPARISON:  None. FINDINGS: Brain: Normal cerebral volume. No acute intracranial hemorrhage identified. No midline shift, mass effect, or evidence of intracranial mass lesion. No ventriculomegaly. Asymmetric hypodensity at the left insula on series 2, image 15. But no other cytotoxic edema identified in the left hemisphere. There is perhaps subtle asymmetric white matter hypodensity such as in the left middle frontal gyrus on coronal image 33. Normal gray-white matter differentiation in the right hemisphere and the posterior fossa. Vascular: No suspicious intracranial vascular hyperdensity. Skull: Negative. Sinuses/Orbits: Visualized paranasal sinuses and mastoids are clear. Other: Leftward gaze. Visualized scalp soft tissues are  within normal limits. ASPECTS [facility] Stroke Program Early CT Score) - Ganglionic level infarction (caudate, lentiform nuclei, internal capsule, insula, M1-M3 cortex): 6 - Supraganglionic infarction (M4-M6 cortex): 2 Total score (0-10 with 10 being normal): 8 IMPRESSION: 1. Leftward gaze deviation with mild hypodensity in the left insula, and also perhaps involving some white matter in the middle frontal gyrus. ASPECTS 8. No associated hemorrhage or mass effect. 2. These results were communicated to Dr. Laurence Slate at 8:01 pm on 04/01/2019 by text page via the [facility] messaging system. Electronically Signed   By: Odessa Fleming M.D.   On: 04/01/2019 20:04   Cerebral Angio 04/01/2019 S/P Lt common carotid arteriogram followed by complete revascularization of occluded M3 region of inferior division of Lt MCA  With x 1 pass with solitairex 31mm x 20 mm retriever and penumbra aspiration achieving a TICI 3 revascularization.. Placement of a 4.5 mm x 77mm pipeline flow  diverter at site of focal dissection associated with a small pseudoaneurysm of distal cervical LT ICA.Marland Kitchen   PHYSICAL EXAM  Temp:  [98.2 F (36.8 C)-101.4 F (38.6 C)] 98.4 F (36.9 C) (03/25 1200) Pulse Rate:  [75-149] 76 (03/25 1200) Resp:  [0-29] 18 (03/25 1200) BP: (103-154)/(59-107) 122/67 (03/25 1200) SpO2:  [95 %-100 %] 100 % (03/25 1200) FiO2 (%):  [30 %-100 %] 30 % (03/25 1200) Weight:  [72.3 kg] 72.3 kg (03/24 2023)  General - Well nourished, well developed, intubated on sedation.  Ophthalmologic - fundi not visualized due to noncooperation.  Cardiovascular - Regular rate and rhythm.  Neuro - intubated on sedation, eyes closed but able to open with repetitive stimulation, not following commands. With forced eye opening, eyes in mid position, inconsistently blinking to visual threat, spontaneous rolling eyes, not tracking, PERRL. Corneal reflex present, gag and cough present. Breathing  over the vent.  Facial symmetry not able to test due to  ET tube.  Tongue protrusion not cooperative. When passively lift up LUE, pt can hold LUE without drift. However, RUE drift down to bed gradually within 5 sec. LLE spontaneously movement at least 2+/5. RLE held straight due to large right groin hematoma. DTR 1+ and positive babinski on the right. Sensation, coordination and gait not tested.   ASSESSMENT/PLAN Ms. Azaliah Ola Spurr Sautter is a 29 y.o. female with unknown medical history, possible heroin abuse, presenting with aphasia and R sided weakness. tPA not given d/t multiple BUL lung nodules w/ concern for septic embolic. Found to have L M3 occlusion and sent to IR for mechanical thrombectomy.  Stroke:  left MCA infarct s/p IR L M3 occlusion w/ TICI3 revascularization, stent to L cervical ICA over focal dissection small pseudoaneurysm. 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 ASPECTS 8    CTA head & neck L MCA M3 branch occlusion. Multiple B upper lung nodules, ? Septic emboli.   CT perfusion 66mL core infarct w/ 68ml L MCA penumbra  Cerebral angio L M3 occlusion s/p TICI3 revascularization. Pipeline sent placed at site of focal dissection associated w/ small pseudoaneurysm distal cervical L ICA.   CT head 3/24 repeat. L MCA contrast staining. Minimal L frontal lobe white matter infarct.   MRI  Pending  MRA  Pending  LE Doppler  Right common femoral vein acute DVT  2D Echo EF 55-60%, no vegetation seen  Will need TEE once stabilized  LDL NOT CALCULATED d/t TG 258. Check direct LDL pending     HgbA1c 5.9  HIV pending   SCDs for VTE prophylaxis  unknown meds prior to admission, now on aspirin 81 mg daily and Brilinta (ticagrelor) 90 mg bid.   P2Y12 pending for antiplatelet dose adjustment by NIR  Therapy recommendations:  pending   Disposition:  pending   Acute Respiratory Failure  Intubated for IR  left intubated for airway  protection  Sedated  Wean to extubation as able  CCM on board  Lung Nodules, possible septic emboli ? infective endocarditis Fever   CT chest B lung multifocal nodularity, largest superior RLL. Dif includes: PNA, COVID, septic emboli and mets. Mild cardiomegaly. Heterogenous B nephrogram. ET in mid trachea. GT in stomach. Tissue in mediastinum c/w residual thymic tissue.  WBC 19.5->21.4->26.4  No nuchal rigidity - less likely meningitis - not LP candidate given on brilinta - on cefepime and vanco for coverage  Zosyn 3/25>>3/25  Vanc  3/25>>  Cefepime 3/25>>  BloodCx no growth < 12h   Sputum cx pending    DVT  LE venous doppler - right common femoral vein DVT  Not candidate for anticoagulation due to large right groin hematoma and possible endocarditis  Will consult IR for IVC filter  Substance abuse, Possible IVDU Sinus Tachycardia, possible withdrawal   UDS:  THC POSITIVE, Amphetamines POSITIVE  Suspicious for heroin use.   Multiple needle marks b/l UEs, L>R.   Fluid bolus  Fentanyl gtt  CIWA protocol  R groin hematoma  Sheath removed post IR w/ angioseal placed  New R groin hematoma on rounds  CT pelvis large diffuse hematoma anterior R groin and upper thigh NOT extending into intra-abd retroperitoneal structures. Limited eval for pseudoaneurysm  LE pseudoaneurysm US - No evidence of pseudoaneurysm, or AVF.   Hgb 11.2->12.2->9.3   Not candidate for anticoagulation at this time  Hypotension . BP goal per IR x 24h 120-140  . On phenylephrine . Long-term BP goal normotensive  Possible Hyperlipidemia  LDL NOT CALCULATED d/t TG 161258.   Direct LDL pending, Goal LDL < 70  Home meds unknown  Now on Lipitor 80  Dysphagia Secondary to stroke NPO Speech on board  Elevated blood hcG  I-stat hcG 7.4  HCG beta chain - neg  Repeat HcG beta chain pending  Urine pregnancy test - pending  B12 deficiency   B12 = 175  B12  supplement  Other Stroke Risk Factors    Other Active Problems  Hyponatremia 127->130->132 - continue IVF  Hospital day # 1  This patient is critically ill due to left MCA stroke, septic emboli, IVDU, DVT, hypotension, fever and at significant risk of neurological worsening, death form recurrent stroke, hemorrhagic conversion, sepsis, septic shock, heart failure, seizure. This patient's care requires constant monitoring of vital signs, hemodynamics, respiratory and cardiac monitoring, review of multiple databases, neurological assessment, discussion with family, other specialists and medical decision making of high complexity. I spent 45 minutes of neurocritical care time in the care of this patient.  Marvel PlanJindong Zan Orlick, MD PhD Stroke Neurology 04/02/2019 2:29 PM  To contact Stroke Continuity provider, please refer to [email or web address]. After hours, contact General Neurology

## 2019-04-02 NOTE — Plan of Care (Signed)
Tried to call family to give update. The number listed in chart for mom [phone] is not correct, it went to "[facility]" and let me leave message. I did not leave any message. Will need to further clarify family number.  I discussed with Dr. Corliss Skains and agree with IVC filter for right DVT instead of anticoagulation given large groin hematoma, endocarditis, septic emboli and on brilinta for stenting. Contacted IR and IVC filter will be done tomorrow.  Marvel Plan, MD PhD Stroke Neurology 04/02/2019 7:04 PM

## 2019-04-02 NOTE — Progress Notes (Signed)
PT Cancellation Note  Patient Details Name: Lauren Reyes MRN: [id number] DOB: Sep 25, 1990   Cancelled Treatment:    Reason Eval/Treat Not Completed: Patient's level of consciousness;Patient not medically ready. Per discussion with RN patient remains sedated and intubated, currently unable to follow commands or participate in skilled PT intervention. PT will attempt to follow up when patient is more medically appropriate.   Arlyss Gandy 04/02/2019, 8:37 AM

## 2019-04-02 NOTE — Progress Notes (Signed)
Initial Nutrition Assessment  DOCUMENTATION CODES:   Not applicable  INTERVENTION:   If remains intubated recommend Vital AF 1.2 @ 65 ml/hr (1560 ml/day)  Provides: 1872 kcal, 117 grams protein, 1265 ml free water.    NUTRITION DIAGNOSIS:   Increased nutrient needs related to acute illness as evidenced by estimated needs.  GOAL:   Patient will meet greater than or equal to 90% of their needs  MONITOR:   Vent status, I & O's  REASON FOR ASSESSMENT:   Ventilator    ASSESSMENT:   Pt with suspected IV drug abuse who while being interviewed by police developed left-sided weakness admitted with L MCA s/p IR for revascularization and L ICA stent.  Pt discussed during ICU rounds and with RN. Labs meet criteria for early pregnancy.  Pt maxed on Neo for BP.  Per CCM concern for infective endocarditis causing septic emboli/pulmonary nodules in the lungs (cavitating noted on CT).  Pt with hematoma per CCM may extubate once resolved.   Patient is currently intubated on ventilator support MV: 9 L/min Temp (24hrs), Avg:99.3 F (37.4 C), Min:98.2 F (36.8 C), Max:101.4 F (38.6 C)  Medications reviewed and include: Vitamin B12 x 1 1000 mcg IM then PO daily  Neosynephrine @ 200 mcg  Precedex  Labs reviewed: Na 132 (L), Vitamin B12: 175 (L) 16 F OG tube   NUTRITION - FOCUSED PHYSICAL EXAM:    Most Recent Value  Orbital Region  No depletion  Upper Arm Region  No depletion  Thoracic and Lumbar Region  No depletion  Buccal Region  No depletion  Temple Region  No depletion  Clavicle Bone Region  No depletion  Clavicle and Acromion Bone Region  No depletion  Scapular Bone Region  Unable to assess  Dorsal Hand  No depletion  Patellar Region  No depletion  Anterior Thigh Region  No depletion  Posterior Calf Region  No depletion  Edema (RD Assessment)  None  Hair  Reviewed  Eyes  Unable to assess  Mouth  Unable to assess  Skin  Unable to assess  Nails  Unable to assess        Diet Order:   Diet Order            Diet NPO time specified  Diet effective now              EDUCATION NEEDS:   No education needs have been identified at this time  Skin:  Skin Assessment: Reviewed RN Assessment  Last BM:  unknown  Height:   Ht Readings from Last 1 Encounters:  04/01/19 5\' 5"  (1.651 m)    Weight:   Wt Readings from Last 1 Encounters:  04/01/19 72.3 kg    Ideal Body Weight:  56.8 kg  BMI:  Body mass index is 26.52 kg/m.  Estimated Nutritional Needs:   Kcal:  1900  Protein:  100-115 grams  Fluid:  > 1.9 L/day  04/03/19., RD, LDN, CNSC See AMiON for contact information

## 2019-04-02 NOTE — Progress Notes (Signed)
PCCM INTERVAL PROGRESS NOTE  #20 gauge PIV placed in left external jugular vein.     Joneen Roach, AGACNP-BC [facility]  See Amion for personal pager PCCM on call pager [phone]  04/02/2019 12:41 AM

## 2019-04-02 NOTE — Progress Notes (Signed)
eLink Physician-Brief Progress Note Patient Name: Lauren Reyes DOB: 12-24-1990 MRN: [id number]   Date of Service  04/02/2019  HPI/Events of Note  Sinus tachycardia  eICU Interventions  1000 ml LR fluid bolus x 1, Fentanyl infusion in case patient is actively withdrawing from opiates.        Lauren Reyes 04/02/2019, 1:42 AM

## 2019-04-03 ENCOUNTER — Inpatient Hospital Stay (HOSPITAL_COMMUNITY): Payer: Medicaid Other

## 2019-04-03 DIAGNOSIS — I82411 Acute embolism and thrombosis of right femoral vein: Secondary | ICD-10-CM

## 2019-04-03 DIAGNOSIS — I63512 Cerebral infarction due to unspecified occlusion or stenosis of left middle cerebral artery: Secondary | ICD-10-CM

## 2019-04-03 DIAGNOSIS — I6602 Occlusion and stenosis of left middle cerebral artery: Secondary | ICD-10-CM

## 2019-04-03 HISTORY — PX: IR IVC FILTER PLMT / S&I /IMG GUID/MOD SED: IMG701

## 2019-04-03 LAB — MAGNESIUM: Magnesium: 2 mg/dL (ref 1.7–2.4)

## 2019-04-03 LAB — BASIC METABOLIC PANEL
Anion gap: 8 (ref 5–15)
BUN: 10 mg/dL (ref 6–20)
CO2: 21 mmol/L — ABNORMAL LOW (ref 22–32)
Calcium: 7.7 mg/dL — ABNORMAL LOW (ref 8.9–10.3)
Chloride: 112 mmol/L — ABNORMAL HIGH (ref 98–111)
Creatinine, Ser: 0.6 mg/dL (ref 0.44–1.00)
GFR calc Af Amer: >60 mL/min (ref >60)
GFR calc non Af Amer: >60 mL/min (ref >60)
Glucose, Bld: 104 mg/dL — ABNORMAL HIGH (ref 70–99)
Potassium: 4.1 mmol/L (ref 3.5–5.1)
Sodium: 141 mmol/L (ref 135–145)

## 2019-04-03 LAB — CBC
HCT: 26.5 % — ABNORMAL LOW (ref 36.0–46.0)
HCT: 25.1 % — ABNORMAL LOW (ref 36.0–46.0)
Hemoglobin: 8.6 g/dL — ABNORMAL LOW (ref 12.0–15.0)
Hemoglobin: 8.4 g/dL — ABNORMAL LOW (ref 12.0–15.0)
MCH: 25.3 pg — ABNORMAL LOW (ref 26.0–34.0)
MCH: 25.8 pg — ABNORMAL LOW (ref 26.0–34.0)
MCHC: 32.5 g/dL (ref 30.0–36.0)
MCHC: 33.5 g/dL (ref 30.0–36.0)
MCV: 77.9 fL — ABNORMAL LOW (ref 80.0–100.0)
MCV: 77 fL — ABNORMAL LOW (ref 80.0–100.0)
Platelets: 314 10*3/uL (ref 150–400)
Platelets: 315 10*3/uL (ref 150–400)
RBC: 3.4 MIL/uL — ABNORMAL LOW (ref 3.87–5.11)
RBC: 3.26 MIL/uL — ABNORMAL LOW (ref 3.87–5.11)
RDW: 15 % (ref 11.5–15.5)
RDW: 14.8 % (ref 11.5–15.5)
WBC: 25.6 10*3/uL — ABNORMAL HIGH (ref 4.0–10.5)
WBC: 29.9 10*3/uL — ABNORMAL HIGH (ref 4.0–10.5)
nRBC: 0 % (ref 0.0–0.2)
nRBC: 0 % (ref 0.0–0.2)

## 2019-04-03 LAB — HOMOCYSTEINE: Homocysteine: 11.3 umol/L (ref 0.0–14.5)

## 2019-04-03 LAB — PROCALCITONIN: Procalcitonin: 2.39 ng/mL

## 2019-04-03 LAB — PHOSPHORUS: Phosphorus: 3.3 mg/dL (ref 2.5–4.6)

## 2019-04-03 LAB — HCG, QUANTITATIVE, PREGNANCY: hCG, Beta Chain, Quant, S: <1 m[IU]/mL (ref <5)

## 2019-04-03 LAB — LDL CHOLESTEROL, DIRECT: Direct LDL: 42.9 mg/dL (ref 0–99)

## 2019-04-03 MED ORDER — SODIUM CHLORIDE 0.9 % IV SOLN: INTRAVENOUS | Status: DC

## 2019-04-03 MED ORDER — VITAMIN B-12 1000 MCG PO TABS
1000.0000 ug | ORAL_TABLET | Freq: Every day | ORAL | Status: DC
  Administered 2019-04-03 – 2019-04-04 (×2): 1000 ug
  Filled 2019-04-03 (×3): qty 1

## 2019-04-03 MED ORDER — LIDOCAINE HCL 1 % IJ SOLN
INTRAMUSCULAR | Status: AC
  Filled 2019-04-03: qty 20

## 2019-04-03 MED ORDER — VITAMIN B-12 1000 MCG PO TABS
1000.0000 ug | ORAL_TABLET | Freq: Every day | ORAL | Status: DC
  Administered 2019-04-06 – 2019-04-27 (×22): 1000 ug via ORAL
  Filled 2019-04-03 (×22): qty 1

## 2019-04-03 MED ORDER — ATORVASTATIN CALCIUM 80 MG PO TABS
80.0000 mg | ORAL_TABLET | Freq: Every day | ORAL | Status: DC
  Administered 2019-04-03 – 2019-04-24 (×19): 80 mg
  Filled 2019-04-03 (×17): qty 1
  Filled 2019-04-03: qty 2
  Filled 2019-04-03 (×3): qty 1

## 2019-04-03 MED ORDER — IOHEXOL 300 MG/ML  SOLN
100.0000 mL | Freq: Once | INTRAMUSCULAR | Status: AC | PRN
  Administered 2019-04-03: 30 mL via INTRAVENOUS

## 2019-04-03 MED ORDER — VANCOMYCIN HCL 1.25 G IV SOLR
1250.0000 mg | Freq: Two times a day (BID) | INTRAVENOUS | Status: DC
  Administered 2019-04-04: 1250 mg via INTRAVENOUS
  Filled 2019-04-03 (×2): qty 1250

## 2019-04-03 MED ORDER — PHENYLEPHRINE CONCENTRATED 100MG/250ML (0.4 MG/ML) INFUSION SIMPLE
0.0000 ug/min | INTRAVENOUS | Status: DC
  Administered 2019-04-03: 300 ug/min via INTRAVENOUS
  Administered 2019-04-03: 190 ug/min via INTRAVENOUS
  Administered 2019-04-04: 70 ug/min via INTRAVENOUS
  Filled 2019-04-03 (×5): qty 250

## 2019-04-03 MED FILL — Phenylephrine HCl IV Soln 10 MG/ML: INTRAVENOUS | Qty: 100 | Status: AC

## 2019-04-03 MED FILL — Sodium Chloride IV Soln 0.9%: INTRAVENOUS | Qty: 250 | Status: AC

## 2019-04-03 NOTE — Progress Notes (Signed)
STROKE TEAM PROGRESS NOTE   INTERVAL HISTORY She is s/p IVC filter placement.  Currently on Neo and Fentanyl gtts.  Sister is at bedside and confirms IVDA history in patient.    Vitals:   04/03/19 0630 04/03/19 0700 04/03/19 0725 04/03/19 0800  BP: 128/64 126/73  123/71  Pulse: 81 83  80  Resp: 20 (!) 21  (!) 24  Temp:    98.1 F (36.7 C)  TempSrc:    Axillary  SpO2: 100% 100% 100% 100%  Weight:      Height:        CBC:  Recent Labs  Lab 04/01/19 1943 04/01/19 2003 04/02/19 0013 04/02/19 0032 04/02/19 2018 04/03/19 0502  WBC 19.5*   < > 21.4*   < > 27.5* 25.6*  NEUTROABS 17.0*  --  18.4*  --   --   --   HGB 11.9*   < > 11.2*   < > 9.9* 8.6*  HCT 35.3*   < > 32.5*   < > 29.0* 26.5*  MCV 75.4*   < > 75.6*   < > 74.7* 77.9*  PLT 285   < > 254   < > 270 314   < > = values in this interval not displayed.    Basic Metabolic Panel:  Recent Labs  Lab 04/02/19 0013 04/02/19 0013 04/02/19 0032 04/02/19 0159 04/03/19 0502  NA 130*   < > 132*  --  141  K 3.8   < > 3.7  --  4.1  CL 95*  --   --   --  112*  CO2 24  --   --   --  21*  GLUCOSE 133*  --   --   --  104*  BUN 5*  --   --   --  10  CREATININE 0.64  --   --   --  0.60  CALCIUM 7.8*  --   --   --  7.7*  MG  --   --   --  1.9 2.0  PHOS  --   --   --   --  3.3   < > = values in this interval not displayed.   Lipid Panel:     Component Value Date/Time   CHOL 163 04/02/2019 0012   TRIG 258 (H) 04/02/2019 0012   HDL <10 (L) 04/02/2019 0012   CHOLHDL NOT CALCULATED 04/02/2019 0012   VLDL 52 (H) 04/02/2019 0012   LDLCALC NOT CALCULATED 04/02/2019 0012   HgbA1c:  Lab Results  Component Value Date   HGBA1C 5.9 (H) 04/02/2019   Urine Drug Screen:     Component Value Date/Time   LABOPIA NONE DETECTED 04/01/2019 2349   COCAINSCRNUR NONE DETECTED 04/01/2019 2349   LABBENZ NONE DETECTED 04/01/2019 2349   AMPHETMU POSITIVE (A) 04/01/2019 2349   THCU POSITIVE (A) 04/01/2019 2349   LABBARB NONE DETECTED  04/01/2019 2349    Alcohol Level No results found for: ETH  IMAGING  CT CHEST WO CONTRAST 04/02/2019 IMPRESSION:  1. Multifocal nodularity in both lungs, largest areas in the superior segment of the right lower lobe measuring 2 x 1.9 cm. Similar smaller nodules showing more central soft tissue attenuation and ill-defined ground-glass margins scattered about the chest. Differential diagnosis includes multifocal pneumonia and would include viral or atypical processes, even COVID-19 infection given the peripheral nature of many of these nodules. Septic emboli and metastatic disease are also considered, close follow-up is suggested.  2. Mild  cardiomegaly.  3. Heterogeneous appearance of the nephrogram bilaterally in the setting of recent angiography and thrombectomy. Continued correlation with renal function is suggested.  4. Endotracheal tube terminates in the mid trachea. Gastric tube in the stomach.  5. Triangular marbled fatty and soft tissue density in the anterior mediastinum compatible with residual thymic tissue.    MR BRAIN W WO CONTRAST MR ANGIO HEAD WO CONTRAST 04/02/2019 IMPRESSION:  1. Intracranial MRA is negative for vessel occlusion. The left ICA stent is patent. There is mild to moderate irregularity of the left MCA M1 with some stenosis, and mild similar irregularity of the distal ACAs.  2. Small 2-3 cm confluent infarct at the Left Insula with Heidelberg Classification 1b petechial hemorrhage.  3. But numerous smaller acute infarcts scattered throughout the brain. Deep gray nuclei and posterior fossa relatively spared. Several other areas of similar petechial hemorrhage. This constellation remains suspicious for septic emboli.  4. Suggestion also of trace subarachnoid hemorrhage. No intracranial mass effect. No intraventricular hemorrhage or ventriculomegaly.   DG Chest Port 1 View 04/03/2019 IMPRESSION:  1. Endotracheal tube, NG tube, left subclavian line stable position.  2.   Progressive bilateral pulmonary infiltrates/edema.   DG CHEST PORT 1 VIEW 04/02/2019 IMPRESSION:  Interval placement of left central line with the tip in the upper right atrium. No pneumothorax. Left base atelectasis.   VAS [place] GROIN PSEUDOANEURYSM 04/02/2019 Summary:  No evidence of pseudoaneurysm, or AVF.   Final    ECHOCARDIOGRAM COMPLETE 04/02/2019 IMPRESSIONS   1. Normal LV function; moderate AI; mild MR and TR.   2. Left ventricular ejection fraction, by estimation, is 55 to 60%. The left ventricle has normal function. The left ventricle has no regional wall motion abnormalities. Left ventricular diastolic parameters were normal.   3. Right ventricular systolic function is normal. The right ventricular size is normal. There is mildly elevated pulmonary artery systolic pressure.   4. The mitral valve is normal in structure. Mild mitral valve regurgitation. No evidence of mitral stenosis.   5. The aortic valve is tricuspid. Aortic valve regurgitation is moderate. Mild aortic valve sclerosis is present, with no evidence of aortic valve stenosis.   6. The inferior vena cava is normal in size with greater than 50% respiratory variability, suggesting right atrial pressure of 3 mmHg.    VAS [place] LOWER EXTREMITY VENOUS (DVT) 04/02/2019 Summary:  RIGHT: - Findings consistent with acute deep vein thrombosis involving the right common femoral vein. - No cystic structure found in the popliteal fossa.   LEFT: - There is no evidence of deep vein thrombosis in the lower extremity.  - No cystic structure found in the popliteal fossa Final    Cerebral Angio 04/01/2019 S/P Lt common carotid arteriogram followed by complete revascularization of occluded M3 region of inferior division of Lt MCA  With x 1 pass with solitairex 17mm x 20 mm retriever and penumbra aspiration achieving a TICI 3 revascularization. Placement of a 4.5 mm x 45mm pipeline flow diverter at site of focal dissection associated with a  small pseudoaneurysm of distal cervical LT ICA.Marland Kitchen   PHYSICAL EXAM  Temp:  [98.1 F (36.7 C)-99.8 F (37.7 C)] 98.1 F (36.7 C) (03/26 0800) Pulse Rate:  [75-92] 80 (03/26 0800) Resp:  [8-28] 24 (03/26 0800) BP: (114-138)/(55-91) 123/71 (03/26 0800) SpO2:  [99 %-100 %] 100 % (03/26 0800) FiO2 (%):  [30 %] 30 % (03/26 0800)  Fentanyl gtt stopped for a few minutes. Opens eyes to voice but dysconjugate eyes  mostly with left turned external. Will not follow with eyes.  She does move to command, L>>R, but not briskly. She grimaces a little to pain all over.   ASSESSMENT/PLAN Lauren Reyes is a 29 y.o. female with unknown medical history, possible heroin abuse, presenting with aphasia and R sided weakness. tPA not given d/t multiple BUL lung nodules w/ concern for septic embolic. Found to have L M3 occlusion and sent to IR for mechanical thrombectomy.  Stroke:  left MCA infarct s/p IR L M3 occlusion w/ TICI3 revascularization, stent to L cervical ICA over focal dissection small pseudoaneurysm. 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 ASPECTS 8    CTA head & neck - L MCA M3 branch occlusion. Multiple B upper lung nodules, ? Septic emboli.   CT perfusion - 15mL core infarct w/ 59ml L MCA penumbra  Cerebral angio - L M3 occlusion s/p TICI3 revascularization. Pipeline sent placed at site of focal dissection associated w/ small pseudoaneurysm distal cervical L ICA.   CT head - 3/24 repeat. L MCA contrast staining. Minimal L frontal lobe white matter infarct.   MRI -  Small 2-3 cm confluent infarct at the Left Insula with Heidelberg Classification 1b petechial hemorrhage. But numerous smaller acute infarcts scattered throughout the brain.  Several other areas of similar petechial hemorrhage. This constellation remains suspicious for septic emboli. Suggestion also of trace subarachnoid  hemorrhage.  MRA - negative for vessel occlusion. The left ICA stent is patent.  LE Doppler - Right common femoral vein acute DVT  2D Echo EF 55-60%, no vegetation seen  Will need TEE once stabilized  LDL NOT CALCULATED d/t TG 811. Check direct LDL - 42.9  HgbA1c 5.9  SARS - negative  HIV - non reactive  SCDs for VTE prophylaxis  unknown meds prior to admission, now on aspirin 81 mg daily and Brilinta (ticagrelor) 90 mg bid for stent. (Suggestion also of trace subarachnoid hemorrhage on MRI)  P2Y12 - 115 - for antiplatelet dose adjustment by NIR  Therapy recommendations:  pending   Disposition:  pending   Acute Respiratory Failure  Intubated for IR  left intubated for airway protection  Sedated  Wean to extubation as able  CCM on board  Lung Nodules, possible septic emboli ? infective endocarditis Fever   CT chest B lung multifocal nodularity, largest superior RLL. Dif includes: PNA, COVID, septic emboli and mets. Mild cardiomegaly. Heterogenous B nephrogram. ET in mid trachea. GT in stomach. Tissue in mediastinum c/w residual thymic tissue.  WBC 19.5->21.4->26.4->25.6  Temperature - 98.1  No nuchal rigidity - less likely meningitis - not LP candidate given on brilinta - on cefepime and vanco for coverage  Zosyn 3/25>>3/25->off  Vanc 3/25>>off  Cefepime 3/25>>  Blood Cx no growth day 1  Sputum Cx - MODERATE STAPHYLOCOCCUS AUREUS SUSCEPTIBILITIES TO FOLLOW   DVT  LE venous doppler - right common femoral vein DVT  Not candidate for anticoagulation due to large right groin hematoma and possible endocarditis  IR consulted for IVC filter - spoke with IR PA - IVC filter planned for today (Saturday)  Substance abuse, Possible IVDU Sinus Tachycardia, possible withdrawal   UDS:  THC POSITIVE, Amphetamines POSITIVE  Suspicious for heroin use.   Multiple needle marks b/l UEs, L>R.   Fluid bolus  Fentanyl gtt  CIWA protocol  R groin  hematoma  Sheath removed post IR w/ angioseal  placed  New R groin hematoma on rounds  CT pelvis large diffuse hematoma anterior R groin and upper thigh NOT extending into intra-abd retroperitoneal structures. Limited eval for pseudoaneurysm  LE pseudoaneurysm [place] - No evidence of pseudoaneurysm, or AVF.   Hgb 11.2->12.2->9.3->8.6  Not candidate for anticoagulation at this time  Hypotension . BP goal per IR x 24h 120-140  . On phenylephrine . Long-term BP goal normotensive  Possible Hyperlipidemia  LDL NOT CALCULATED d/t TG 258.   Direct LDL 42.9, Goal LDL < 70  Home meds unknown  Now on Lipitor 80  Dysphagia Secondary to stroke NPO Speech on board  Elevated blood hcG  I-stat hcG 7.4  HCG beta chain - neg  Repeat HcG beta chain - <1  Urine pregnancy test - negative  B12 deficiency   B12 = 175  B12 supplement  Other Stroke Risk Factors  Substance abuse  Other Active Problems  Hyponatremia 127->130->132 - continue IVF->141   Pt's mother lives in [place] - IR PA spoke with her to obtain consent for IVC filter. Pt also has a sister, Bilan Tedesco, who would like to be called with an update on pt's condition. [phone]   Hospital day # 2   Impression:   MRI Brain shows multiple bilateral frontal, parietal, occipital, and left cerebellar infarcts.  The left parietal infarct is the largest.  Several of these lesions have some hemorrhagic conversion.  She is on dual antiplatelet therapy due to the left ICA stent.  She has right leg DVT and IVC filter was placed today to avoid anticoagulation so as to reduce risk of worsening hemorrhage.  Although TTE was normal, it is not as sensitive in assessing for valvular vegetations from IVDA or even intracardiac thrombus; therefore, I ordered TEE.  She seems to have had cardioembolic strokes either due to septic embolism or cardiac thrombus secondary to amphetamine induced arrhythmia.  No atrial fibrillation/flutter  has been identified here yet.    She is positive for staphylococcus aureus in the sputum and on is on Cefepime and Vancomycin.  Blood cultures are negative so far.    Rogue Jury, MS, MD     This patient is critically ill due to left MCA stroke, septic emboli, IVDU, DVT, hypotension, fever and at significant risk of neurological worsening, death form recurrent stroke, hemorrhagic conversion, sepsis, septic shock, heart failure, seizure. This patient's care requires constant monitoring of vital signs, hemodynamics, respiratory and cardiac monitoring, review of multiple databases, neurological assessment, discussion with family, other specialists and medical decision making of high complexity. I spent 45 minutes of neurocritical care time in the care of this patient.   To contact Stroke Continuity provider, please refer to [email or web address] After hours, contact General Neurology

## 2019-04-03 NOTE — Progress Notes (Signed)
Spoke with patient's sister at bedside, per sister Elmarie Shiley their parents are out of country in [place] for ministry work and she should be contacted first for immediate patient needs at [phone]. Sister states that patient has history of substance abuse, she has not been in contact with patient since 05/2018 when patient moved out of family home after being caught by mother using illegal drugs.   Aris Lot, RN

## 2019-04-03 NOTE — Progress Notes (Signed)
Pharmacy Antibiotic Note  Lauren Reyes is a 29 y.o. female admitted on 04/01/2019 with new CVA and incidental findings concerning for pulmonary septic emboli. Pharmacy was consulted for Vancomycin + Zosyn dosing for rule out endocarditis, transitioned from Zosyn to cefepime.    The patient is revascularization by IR for occluded L-MCA. The patient also had a mildly elevated hCG - repeat hCG was negative.   SCr 0.6, CrCl~100 ml/min. WBC is trending up at 29.9 this afternoon. Patient is afebrile.   Plan: - Increase vancomycin dose from 1g IV q12hr to 1250 mg IV q12hr for est AUC 449 using Scr 0.6. Goal AUC 400-550.  - Continue cefepime 2g IV q8hr - Will continue to follow renal function, culture results, LOT, and antibiotic de-escalation plans    Height: 5\' 5"  (165.1 cm) Weight: 159 lb 6.3 oz (72.3 kg) IBW/kg (Calculated) : 57  Temp (24hrs), Avg:98.8 F (37.1 C), Min:98.1 F (36.7 C), Max:99.8 F (37.7 C)  Recent Labs  Lab 04/01/19 1943 04/01/19 1943 04/01/19 2003 04/02/19 0013 04/02/19 0013 04/02/19 1223 04/02/19 1659 04/02/19 2018 04/03/19 0502 04/03/19 1355  WBC 19.5*   < >  --  21.4*   < > 26.4* 23.5* 27.5* 25.6* 29.9*  CREATININE 0.76  --  0.70 0.64  --   --   --   --  0.60  --    < > = values in this interval not displayed.    Estimated Creatinine Clearance: 102.4 mL/min (by C-G formula based on SCr of 0.6 mg/dL).    Not on File  Antimicrobials this admission: Vanc 3/25 >> Zosyn 3/25 >> 3/25 Cefepime 3/25 >>  Dose adjustments this admission: n/a  Microbiology results: 3/24 COVID/Flu >> neg 3/25 BCx >> 3/25 RCx >> Gram positive cocci in clusters, S. aureus pending sensitivities 3/25 MRSA PCR >> Negative  Thank you for allowing pharmacy to be a part of this patient's care.  4/25, PharmD PGY1 Pharmacy Resident

## 2019-04-03 NOTE — Progress Notes (Addendum)
NAME:  Lauren Reyes, MRN:  [id number], DOB:  04-18-1990, LOS: 2 ADMISSION DATE:  04/01/2019, CONSULTATION DATE: 04/02/2019 REFERRING MD: Corliss Skains, CHIEF COMPLAINT: CVA  Brief History     29 year old female with suspected IV drug use adm with sudden onset right-sided weakness and left MCA occlusion status post revascularization by neuro IR and left ICA stent  Past Medical History  No available previous medical history  Significant Hospital Events   NA Consults:  PCCM, interventional radiology  Procedures:  3/24 S/P Lt common carotid arteriogram followed by complete revascularization of occluded M3 region of inferior division of Lt MCA , Placement of a 4.5 mm x 65mm pipeline flow diverter at site of focal dissection associated with a small pseudoaneurysm of distal cervical LT ICA..  - ET 3/24 - L Subclavian CVL 3/25   Significant Diagnostic Tests:  CT scan of the brain / neck  along with perfusion scan 3/24 >> Positive for a Left MCA proximal M3 brandch occlusion  Positive also for multiple bilateral upper lung nodules, some with early cavitation. CT pelvis 3/25 : 1. Large diffuse hematoma involving the anterior right groin and upper right thigh. Hematoma does not extend into the intra-abdominal retroperitoneal structures. CT chest   3/25  Multifocal nodularity in both lungs, largest areas in the superior segment of the right lower lobe measuring 2 x 1.9 cm. Similar smaller nodules showing more central soft tissue attenuation and ill-defined ground-glass margins scattered about the chest. Differential diagnosis includes multifocal pneumonia and would include viral or atypical processes, even COVID-19 infection given the peripheral nature of many of these nodules. Septic emboli and metastatic disease are also considered  Venous dopplers 3/25:  POS  R common fem acute DVT Echo 3/25: 1. Normal LV function; moderate AI; mild MR and TR.  2. Left ventricular ejection  fraction, by estimation, is 55 to 60%. The  left ventricle has normal function. The left ventricle has no regional  wall motion abnormalities. Left ventricular diastolic parameters were  normal.  3. Right ventricular systolic function is normal. The right ventricular  size is normal. There is mildly elevated pulmonary artery systolic  pressure.  4. The mitral valve is normal in structure. Mild mitral valve  regurgitation. No evidence of mitral stenosis.  5. The aortic valve is tricuspid. Aortic valve regurgitation is moderate.  Mild aortic valve sclerosis is present, with no evidence of aortic valve  stenosis.  6. The inferior vena cava is normal in size with greater than 50%  respiratory variability, suggesting right atrial pressure of 3 mmHg   Micro Data:  RVP 3/25 neg covid/ flu MRSA pcr  3/25 > neg  Blood culture x 2 3/25 >> ET 3/25  Rare wbc/ gpc >> Staph >>>  Antimicrobials:  3/25 Vancomycin >> 3/25 Zosyn>>    Interim history/subjective:  Continue to be pressor/ vent dep s/p cvl overnight/ cvp pending     Objective   Blood pressure 123/71, pulse 80, temperature 98.1 F (36.7 C), temperature source Axillary, resp. rate (!) 24, height 5\' 5"  (1.651 m), weight 72.3 kg, SpO2 100 %.    Vent Mode: CPAP;PSV FiO2 (%):  [30 %] 30 % Set Rate:  [16 bmp] 16 bmp Vt Set:  [450 mL] 450 mL PEEP:  [5 cmH20] 5 cmH20 Pressure Support:  [5 cmH20] 5 cmH20 Plateau Pressure:  [15 cmH20-16 cmH20] 16 cmH20   Intake/Output Summary (Last 24 hours) at 04/03/2019 0933 Last data filed at 04/03/2019 0700 Gross per 24 hour  Intake 7618.46 ml  Output 2000 ml  Net 5618.46 ml   Filed Weights   04/01/19 1900 04/01/19 2023  Weight: 72.3 kg 72.3 kg    Examination: Tmax  99.8 (trending down Pt opens eyes to verbal/ comfortable on prvc setting though air trapping  No jvd Oropharynx ET/ OG Neck supple Lungs with a few scattered exp > insp rhonchi bilaterally RRR no s3   II/VI sem and  dsm Abd soft/ bening  Extr warm with no edema or clubbing noted      pCXR 3/26 reviewed Ellery Plunk: 1. Endotracheal tube, NG tube, left subclavian line stable position. 2.  Progressive bilateral pulmonary infiltrates/edema.    Resolved Hospital Problem list   NA  Assessment & Plan:   Lt MCA CVA status post revascularization 3/24 c/b  R groin hematoma With her IVDU, concern is for infective endocarditis causing septic emboli/pulmonary nodules in the lungs  Acute CVA -Per neurology Precedex/fentanyl for goal RASS -1  Circulatory shock ? Sepsis  - not responding high dose neo so will check cvp/cbc/ change to levophedif needed    R Groin hematoma s/p arteriogram 3/24   - neg pseudoaneurym by dopplers 3/25  Lab Results  Component Value Date   HGB 8.6 (L) 04/03/2019   HGB 9.9 (L) 04/02/2019   HGB 10.2 (L) 04/02/2019   >> pressure dressing/  - monitor hgb    Concern for infective endocarditis /pulmonary nodules?  Cavitating noted on CT neck  -await blood cultures per flowsheet  But likely this is MSSA with septic emboli  Continue vancomycin and Zosyn in the meantime     Postop respiratory failure/ vent dependent   - hold weaning for today as going for IVC filter due to RDVT     Substance abuse -UDS positive for THC/amphetamines >>> precedex   Low positive beta-hCG-minimize radiation exposure if possible - repeat 3/26 < 1    Best practice:  Diet: N.p.o. Pain/Anxiety/Delirium protocol (if indicated): Precedex/fentanyl, goal RASS -1 VAP protocol (if indicated): Yes DVT prophylaxis: SCDs patient on anticoagulation GI prophylaxis: Pepcid Glucose control: Monitor Mobility: Bedrest Code Status: Full Family Communication: Not available Disposition:  ICU   The patient is critically ill with multiple organ systems failure and requires high complexity decision making for assessment and support, frequent evaluation and titration of therapies, application of advanced  monitoring technologies and extensive interpretation of multiple databases. Critical Care Time devoted to patient care services described in this note is 45 minutes.    Christinia Gully, MD Pulmonary and [place] [phone] After 6:00 PM or weekends, use Beeper [phone]  After 7:00 pm call Elink  [phone]

## 2019-04-03 NOTE — Progress Notes (Signed)
Allergies are listed as "not on file". Prior to IVC filter placement, this RN spoke with Elmarie Shiley (sister) who stated she was not aware of Synia having any allergies and that none in the family have had any allergies that they are aware of.

## 2019-04-03 NOTE — Progress Notes (Signed)
Referring Physician(s): Code Stroke- Aroor, Barnett Hatter, Jindong  Supervising Physician: Julieanne Cotton  Patient Status:  [facility] - In-pt  Chief Complaint: None- intubated with sedation  Subjective:  History of acute CVA s/p cerebral arteriogram with emergent mechanical thrombectomy of left MCA M3 occlusion achieving a TICI 3 revascularization, along with placement of a pipeline flex flow diverter for distal cervical left ICA pseudoaneurysm 04/01/2019 by Dr. Corliss Skains. Patient laying in bed intubated with sedation. She opens eyes to voice and follows some simple commands. Can move LLE to command but no movements of other extremities. Right groin incision c/d/i.  VAS [place] right groin 04/02/2019: 1. No evidence of pseudoaneurysm, or AVF.  VAS [place] bilateral LE 04/02/2019: RIGHT:  - Findings consistent with acute deep vein thrombosis involving the right  common femoral vein.  - No cystic structure found in the popliteal fossa.  LEFT:  - There is no evidence of deep vein thrombosis in the lower extremity. - No cystic structure found in the popliteal fossa.    Allergies: Patient has no allergy information on record.  Medications: Prior to Admission medications   Not on File     Vital Signs: BP 123/71 (BP Location: Right Arm)   Pulse 80   Temp 98.1 F (36.7 C) (Axillary)   Resp (!) 24   Ht 5\' 5"  (1.651 m)   Wt 159 lb 6.3 oz (72.3 kg)   SpO2 100%   BMI 26.52 kg/m   Physical Exam Vitals and nursing note reviewed.  Constitutional:      General: She is not in acute distress.    Appearance: She is ill-appearing.     Comments: Intubated with sedation.  Pulmonary:     Effort: Pulmonary effort is normal. No respiratory distress.     Comments: Intubated with sedation. Skin:    General: Skin is warm and dry.     Comments: Right groin incision soft without active bleeding or hematoma.  Neurological:     Comments: Intubated with sedation. She opens eyes to voice and  follows some simple commands. PERRL bilaterally. Can move LLE to command but no movements of other extremities. Distal pulses palpable bilaterally with Doppler.     Imaging: CT ANGIO HEAD W OR WO CONTRAST  Result Date: 04/01/2019 CLINICAL DATA:  29 year old female code stroke presentation with suspicion of left side LVO. EXAM: CT ANGIOGRAPHY HEAD AND NECK CT PERFUSION BRAIN TECHNIQUE: Multidetector CT imaging of the head and neck was performed using the standard protocol during bolus administration of intravenous contrast. Multiplanar CT image reconstructions and MIPs were obtained to evaluate the vascular anatomy. Carotid stenosis measurements (when applicable) are obtained utilizing NASCET criteria, using the distal internal carotid diameter as the denominator. Multiphase CT imaging of the brain was performed following IV bolus contrast injection. Subsequent parametric perfusion maps were calculated using RAPID software. CONTRAST:  100 mL Omnipaque 350. COMPARISON:  Plain head CT 1952 hours today. FINDINGS: CT Brain Perfusion Findings: ASPECTS: 8. CBF (<30%) Volume: 4mL Perfusion (Tmax>6.0s) volume: 40mL (hypoperfusion index 0.2). Mismatch Volume: 36mL Infarction Location:Left MCA operculum and subjacent white matter. CTA NECK Skeleton: Occasional dental caries on the right. No acute osseous abnormality identified. Upper chest: There are scattered bilateral lung nodules in the upper lobes and visible superior segment lower lobes. Some of these demonstrate early cavitation (series 1, image 140. The largest visible area of involvement is at least 2 cm in the superior segment of the right lower lobe. No pleural effusion. No superior  mediastinal lymphadenopathy. Visible central pulmonary arteries are patent. Other neck: Negative. Aortic arch: 3 vessel arch configuration with no arch atherosclerosis. Incidental small venous collaterals are enhancing in the superior mediastinum prevascular space. Grossly  normal visible SVC. Right carotid system: Negative. Left carotid system: Negative. Vertebral arteries: Normal proximal right subclavian artery and right vertebral artery origin. Patent and normal right vertebral to the skull base. Normal proximal left subclavian artery and left vertebral artery origin. Patent and normal left vertebral artery to the skull base. CTA HEAD Posterior circulation: Patent and normal vertebral artery V4 segments. Patent vertebrobasilar junction and basilar artery without stenosis. Patent SCA and PCA origins. Posterior communicating arteries are diminutive or absent. Bilateral PCA branches are within normal limits. Anterior circulation: Both ICA siphons are patent. No siphon plaque or stenosis. Patent carotid termini, MCA and ACA origins. Anterior communicating artery and bilateral ACA branches are within normal limits. Right MCA M1 segment and bifurcation are patent without stenosis. Right MCA branches appear within normal limits. The left MCA M1 segment and bifurcation are patent. There is of very proximal left M3 branch occlusion identified on series 10, image 30 and on axial series 5 images 102-100. This is at the level of the mid insula. No other left MCA branch occlusion is identified. Venous sinuses: Patent, the right transverse and sigmoid sinuses appear dominant. Anatomic variants: None. Review of the MIP images confirms the above findings IMPRESSION: 1. Positive for a Left MCA proximal M3 brandch occlusion as seen on CTA series 10, image 30. 2. Positive also for multiple bilateral upper lung nodules, some with early cavitation. Consider Septic Emboli. 3. The above was discussed by telephone with Dr. Arther DamesSUSHANTH AROOR on 04/01/2019 at 2007 hours. 4. CTP detects a very small 4 mL core infarct with 40 mL of Left MCA territory penumbra. 5. No atherosclerosis or large vessel abnormality identified. Electronically Signed   By: Odessa FlemingH  Hall M.D.   On: 04/01/2019 20:20   CT HEAD WO  CONTRAST  Result Date: 04/01/2019 CLINICAL DATA:  29 year old female code stroke presentation tonight with left MCA proximal M3 occlusion. Now status post endovascular revascularization. EXAM: CT HEAD WITHOUT CONTRAST TECHNIQUE: Contiguous axial images were obtained from the base of the skull through the vertex without intravenous contrast. COMPARISON:  Plain head CT 1952 hours tonight. CTA and CTP earlier tonight. FINDINGS: Brain: No intracranial mass effect or ventriculomegaly. Some intravascular contrast is present, and mild contrast staining/stasis is suspected in the left MCA territory (series 3, image 15). There is only minimal left middle frontal gyrus white matter hypodensity noted, with no cortical cytotoxic edema evident. Basilar cisterns remain normal. Stable normal gray-white matter differentiation in the right hemisphere and posterior fossa. Vascular: New distal cervical left ICA vascular stent which continues into the proximal petrous segment (Series 4, image 15). Skull: No acute osseous abnormality identified. Sinuses/Orbits: New paranasal sinus and nasal cavity fluid and fluid levels. Tympanic cavities and mastoids remain clear. Other: There is an enteric tube looped in the nasopharynx (series 4, image 5). No acute orbit or scalp soft tissue finding. IMPRESSION: 1. Enteric tube is looped in the nasopharynx, recommend repositioning. 2. No intracranial mass effect or hemorrhage. Left MCA territory contrast staining/stasis is suspected. 3. Minimal ischemia is evident; only subtle left frontal lobe white matter hypodensity is identified. 4. New distal cervical Left ICA vascular stent extending into the proximal petrous segment. Electronically Signed   By: Odessa FlemingH  Hall M.D.   On: 04/01/2019 23:30   CT ANGIO NECK  W OR WO CONTRAST  Result Date: 04/01/2019 CLINICAL DATA:  29 year old female code stroke presentation with suspicion of left side LVO. EXAM: CT ANGIOGRAPHY HEAD AND NECK CT PERFUSION BRAIN  TECHNIQUE: Multidetector CT imaging of the head and neck was performed using the standard protocol during bolus administration of intravenous contrast. Multiplanar CT image reconstructions and MIPs were obtained to evaluate the vascular anatomy. Carotid stenosis measurements (when applicable) are obtained utilizing NASCET criteria, using the distal internal carotid diameter as the denominator. Multiphase CT imaging of the brain was performed following IV bolus contrast injection. Subsequent parametric perfusion maps were calculated using RAPID software. CONTRAST:  100 mL Omnipaque 350. COMPARISON:  Plain head CT 1952 hours today. FINDINGS: CT Brain Perfusion Findings: ASPECTS: 8. CBF (<30%) Volume: 4mL Perfusion (Tmax>6.0s) volume: 40mL (hypoperfusion index 0.2). Mismatch Volume: 36mL Infarction Location:Left MCA operculum and subjacent white matter. CTA NECK Skeleton: Occasional dental caries on the right. No acute osseous abnormality identified. Upper chest: There are scattered bilateral lung nodules in the upper lobes and visible superior segment lower lobes. Some of these demonstrate early cavitation (series 1, image 140. The largest visible area of involvement is at least 2 cm in the superior segment of the right lower lobe. No pleural effusion. No superior mediastinal lymphadenopathy. Visible central pulmonary arteries are patent. Other neck: Negative. Aortic arch: 3 vessel arch configuration with no arch atherosclerosis. Incidental small venous collaterals are enhancing in the superior mediastinum prevascular space. Grossly normal visible SVC. Right carotid system: Negative. Left carotid system: Negative. Vertebral arteries: Normal proximal right subclavian artery and right vertebral artery origin. Patent and normal right vertebral to the skull base. Normal proximal left subclavian artery and left vertebral artery origin. Patent and normal left vertebral artery to the skull base. CTA HEAD Posterior  circulation: Patent and normal vertebral artery V4 segments. Patent vertebrobasilar junction and basilar artery without stenosis. Patent SCA and PCA origins. Posterior communicating arteries are diminutive or absent. Bilateral PCA branches are within normal limits. Anterior circulation: Both ICA siphons are patent. No siphon plaque or stenosis. Patent carotid termini, MCA and ACA origins. Anterior communicating artery and bilateral ACA branches are within normal limits. Right MCA M1 segment and bifurcation are patent without stenosis. Right MCA branches appear within normal limits. The left MCA M1 segment and bifurcation are patent. There is of very proximal left M3 branch occlusion identified on series 10, image 30 and on axial series 5 images 102-100. This is at the level of the mid insula. No other left MCA branch occlusion is identified. Venous sinuses: Patent, the right transverse and sigmoid sinuses appear dominant. Anatomic variants: None. Review of the MIP images confirms the above findings IMPRESSION: 1. Positive for a Left MCA proximal M3 brandch occlusion as seen on CTA series 10, image 30. 2. Positive also for multiple bilateral upper lung nodules, some with early cavitation. Consider Septic Emboli. 3. The above was discussed by telephone with Dr. Arther Dames on 04/01/2019 at 2007 hours. 4. CTP detects a very small 4 mL core infarct with 40 mL of Left MCA territory penumbra. 5. No atherosclerosis or large vessel abnormality identified. Electronically Signed   By: Odessa Fleming M.D.   On: 04/01/2019 20:20   CT CHEST WO CONTRAST  Result Date: 04/02/2019 CLINICAL DATA:  Abnormal x-ray, lung nodule follow-up. EXAM: CT CHEST WITHOUT CONTRAST TECHNIQUE: Multidetector CT imaging of the chest was performed following the standard protocol without IV contrast. COMPARISON:  None. FINDINGS: Cardiovascular: Heart size mildly enlarged with  low-attenuation cardiac chambers, no sign of pericardial effusion. Aorta is of  normal caliber. Central pulmonary vasculature is normal on noncontrast imaging. Mediastinum/Nodes: Endotracheal tube terminates in the mid trachea. Gastric tube in the stomach. Triangular marbled fatty and soft tissue density in the anterior mediastinum compatible with residual thymic tissue no sign of adenopathy Lungs/Pleura: Multifocal nodularity in the chest. Largest areas in the superior segment of the right lower lobe. (Image 66, series 4) 2 x 1.9 cm. Another smaller area on image 73 measuring 1.6 cm. Similar smaller nodules showing more central soft tissue attenuation and ill-defined ground-glass margins scattered about the chest involving upper and lower lobes. Small nodule in the left lung base (image 90, series 4) 10 mm. (Image 46, series 4) superior segment left lower lobe 8 mm. 7 mm nodule in the lingula on image 92 of series 4. Upper Abdomen: Heterogeneous appearance of the nephrogram bilaterally in the setting of recent angiography and thrombectomy. Musculoskeletal: No acute bone finding or destructive bone process. IMPRESSION: 1. Multifocal nodularity in both lungs, largest areas in the superior segment of the right lower lobe measuring 2 x 1.9 cm. Similar smaller nodules showing more central soft tissue attenuation and ill-defined ground-glass margins scattered about the chest. Differential diagnosis includes multifocal pneumonia and would include viral or atypical processes, even COVID-19 infection given the peripheral nature of many of these nodules. Septic emboli and metastatic disease are also considered, close follow-up is suggested. 2. Mild cardiomegaly. 3. Heterogeneous appearance of the nephrogram bilaterally in the setting of recent angiography and thrombectomy. Continued correlation with renal function is suggested. 4. Endotracheal tube terminates in the mid trachea. Gastric tube in the stomach. 5. Triangular marbled fatty and soft tissue density in the anterior mediastinum compatible with  residual thymic tissue. Electronically Signed   By: Donzetta Kohut M.D.   On: 04/02/2019 10:03   CT PELVIS WO CONTRAST  Result Date: 04/02/2019 CLINICAL DATA:  Recent stroke intervention. Evaluate for a retroperitoneal hematoma. EXAM: CT PELVIS WITHOUT CONTRAST TECHNIQUE: Multidetector CT imaging of the pelvis was performed following the standard protocol without intravenous contrast. COMPARISON:  None. FINDINGS: Urinary Tract: Kidneys not visualized on this examination. Urinary bladder is moderately distended with iodinated contrast. Bowel:  Visualized bowel structures are unremarkable. Vascular/Lymphatic: Extensive subcutaneous edema with high-density material throughout the anterior right groin and upper right thigh. Findings are compatible with blood products and a diffuse hematoma. Limited evaluation of the vascular structures in the right groin without iodinated contrast. No evidence for vascular calcifications. Reproductive: Normal appearance of the uterus. The ovaries and adnexal tissue are mildly prominent but probably normal for age. Other: Extensive hematoma involving the anterior upper right thigh which extends into the medial right thigh. There is no significant hematoma extending into the intra-abdominal cavity or retroperitoneal space. No evidence for ascites. Musculoskeletal: No acute bone abnormality. IMPRESSION: 1. Large diffuse hematoma involving the anterior right groin and upper right thigh. Hematoma does not extend into the intra-abdominal retroperitoneal structures. 2. Limited evaluation for a post catheterization pseudoaneurysm on this non contrast examination. Recommend correlation with vascular ultrasound or CTA. Electronically Signed   By: Richarda Overlie M.D.   On: 04/02/2019 10:13   MR ANGIO HEAD WO CONTRAST  Result Date: 04/02/2019 CLINICAL DATA:  29 year old female code stroke presentation status post endovascular reperfusion last night. EXAM: MRI HEAD WITHOUT AND WITH CONTRAST MRA  HEAD WITHOUT CONTRAST TECHNIQUE: Multiplanar, multiecho pulse sequences of the brain and surrounding structures were obtained without and with intravenous contrast.  Angiographic images of the head were obtained using MRA technique without contrast. CONTRAST:  7mL GADAVIST GADOBUTROL 1 MMOL/ML IV SOLN COMPARISON:  CT head, CTA head and neck and CT Perfusion yesterday. FINDINGS: MRI HEAD FINDINGS Brain: Confluent 2-3 cm area of restricted diffusion at the left insula. T2 and FLAIR hyperintensity with petechial hemorrhage (Heidelberg Classification 1b, series 14, image 26). But there are numerous other areas of cortical, subcortical, and occasional central white matter restricted diffusion in both cerebral hemispheres. Bilateral MCA and PCA territories are affected. ACA territories are relatively spared. Deep gray nuclei are relatively spared. Brainstem is spared. There is minimal involvement of the cerebellum (series 5, image 54 on the left). These additional foci are associated with similar petechial hemorrhage (e.g. Right occipital lobe series 14, image 24 and series 5, image 69). No malignant hemorrhagic transformation. But there is suggestion of trace volume of superimposed subarachnoid hemorrhage (e.g. Series 11, image 19). No intracranial mass effect. No intraventricular hemorrhage or ventriculomegaly. No subdural collection. Basilar cisterns remain patent. Negative pituitary and cervicomedullary junction. Punctate areas of post ischemic enhancement are occasionally noted. No confluent enhancing lesions. No convincing dural thickening. Vascular: Major intracranial vascular flow voids are preserved. See MRA findings below. The major dural venous sinuses are enhancing and appear patent. Skull and upper cervical spine: Negative visible cervical spine and spinal cord. No suspicious marrow lesion. Sinuses/Orbits: Disconjugate gaze. Mild paranasal sinus mucosal thickening. Other: Mastoids remain clear. Intubated with  oral enteric tube in place. MRA HEAD FINDINGS Antegrade flow in the posterior circulation. Patent distal vertebral arteries with no stenosis. Patent PICA origins and vertebrobasilar junction. Patent basilar artery, AICA origins, SCA and PCA origins. Diminutive posterior communicating arteries are present. Bilateral PCA branches are within normal limits. Antegrade flow in both ICA siphons. Susceptibility artifact affecting distal cervical left ICA at the site of a vascular stent. The left siphon remains patent. No siphon stenosis identified. Normal ophthalmic and posterior communicating artery origins. Patent carotid termini, MCA and ACA origins. Anterior communicating artery and proximal A2 segments appear normal. There is mild irregularity of the more distal bilateral A2 segments but no significant stenosis. The right MCA M1 segment and bifurcation are patent. No definite right MCA branch stenosis. The left MCA M1 is patent but irregular with mild to moderate stenosis. The left MCA trifurcation is patent. Visible left MCA branches are within normal limits. IMPRESSION: 1. Intracranial MRA is negative for vessel occlusion. The left ICA stent is patent. There is mild to moderate irregularity of the left MCA M1 with some stenosis, and mild similar irregularity of the distal ACAs. 2. Small 2-3 cm confluent infarct at the Left Insula with Heidelberg Classification 1b petechial hemorrhage. 3. But numerous smaller acute infarcts scattered throughout the brain. Deep gray nuclei and posterior fossa relatively spared. Several other areas of similar petechial hemorrhage. This constellation remains suspicious for septic emboli. 4. Suggestion also of trace subarachnoid hemorrhage. No intracranial mass effect. No intraventricular hemorrhage or ventriculomegaly. Electronically Signed   By: Odessa Fleming M.D.   On: 04/02/2019 16:39   MR BRAIN W WO CONTRAST  Result Date: 04/02/2019 CLINICAL DATA:  29 year old female code stroke  presentation status post endovascular reperfusion last night. EXAM: MRI HEAD WITHOUT AND WITH CONTRAST MRA HEAD WITHOUT CONTRAST TECHNIQUE: Multiplanar, multiecho pulse sequences of the brain and surrounding structures were obtained without and with intravenous contrast. Angiographic images of the head were obtained using MRA technique without contrast. CONTRAST:  7mL GADAVIST GADOBUTROL 1 MMOL/ML IV SOLN  COMPARISON:  CT head, CTA head and neck and CT Perfusion yesterday. FINDINGS: MRI HEAD FINDINGS Brain: Confluent 2-3 cm area of restricted diffusion at the left insula. T2 and FLAIR hyperintensity with petechial hemorrhage (Heidelberg Classification 1b, series 14, image 26). But there are numerous other areas of cortical, subcortical, and occasional central white matter restricted diffusion in both cerebral hemispheres. Bilateral MCA and PCA territories are affected. ACA territories are relatively spared. Deep gray nuclei are relatively spared. Brainstem is spared. There is minimal involvement of the cerebellum (series 5, image 54 on the left). These additional foci are associated with similar petechial hemorrhage (e.g. Right occipital lobe series 14, image 24 and series 5, image 69). No malignant hemorrhagic transformation. But there is suggestion of trace volume of superimposed subarachnoid hemorrhage (e.g. Series 11, image 19). No intracranial mass effect. No intraventricular hemorrhage or ventriculomegaly. No subdural collection. Basilar cisterns remain patent. Negative pituitary and cervicomedullary junction. Punctate areas of post ischemic enhancement are occasionally noted. No confluent enhancing lesions. No convincing dural thickening. Vascular: Major intracranial vascular flow voids are preserved. See MRA findings below. The major dural venous sinuses are enhancing and appear patent. Skull and upper cervical spine: Negative visible cervical spine and spinal cord. No suspicious marrow lesion.  Sinuses/Orbits: Disconjugate gaze. Mild paranasal sinus mucosal thickening. Other: Mastoids remain clear. Intubated with oral enteric tube in place. MRA HEAD FINDINGS Antegrade flow in the posterior circulation. Patent distal vertebral arteries with no stenosis. Patent PICA origins and vertebrobasilar junction. Patent basilar artery, AICA origins, SCA and PCA origins. Diminutive posterior communicating arteries are present. Bilateral PCA branches are within normal limits. Antegrade flow in both ICA siphons. Susceptibility artifact affecting distal cervical left ICA at the site of a vascular stent. The left siphon remains patent. No siphon stenosis identified. Normal ophthalmic and posterior communicating artery origins. Patent carotid termini, MCA and ACA origins. Anterior communicating artery and proximal A2 segments appear normal. There is mild irregularity of the more distal bilateral A2 segments but no significant stenosis. The right MCA M1 segment and bifurcation are patent. No definite right MCA branch stenosis. The left MCA M1 is patent but irregular with mild to moderate stenosis. The left MCA trifurcation is patent. Visible left MCA branches are within normal limits. IMPRESSION: 1. Intracranial MRA is negative for vessel occlusion. The left ICA stent is patent. There is mild to moderate irregularity of the left MCA M1 with some stenosis, and mild similar irregularity of the distal ACAs. 2. Small 2-3 cm confluent infarct at the Left Insula with Heidelberg Classification 1b petechial hemorrhage. 3. But numerous smaller acute infarcts scattered throughout the brain. Deep gray nuclei and posterior fossa relatively spared. Several other areas of similar petechial hemorrhage. This constellation remains suspicious for septic emboli. 4. Suggestion also of trace subarachnoid hemorrhage. No intracranial mass effect. No intraventricular hemorrhage or ventriculomegaly. Electronically Signed   By: Odessa Fleming M.D.   On:  04/02/2019 16:39   CT CEREBRAL PERFUSION W CONTRAST  Result Date: 04/01/2019 CLINICAL DATA:  29 year old female code stroke presentation with suspicion of left side LVO. EXAM: CT ANGIOGRAPHY HEAD AND NECK CT PERFUSION BRAIN TECHNIQUE: Multidetector CT imaging of the head and neck was performed using the standard protocol during bolus administration of intravenous contrast. Multiplanar CT image reconstructions and MIPs were obtained to evaluate the vascular anatomy. Carotid stenosis measurements (when applicable) are obtained utilizing NASCET criteria, using the distal internal carotid diameter as the denominator. Multiphase CT imaging of the brain was performed following  IV bolus contrast injection. Subsequent parametric perfusion maps were calculated using RAPID software. CONTRAST:  100 mL Omnipaque 350. COMPARISON:  Plain head CT 1952 hours today. FINDINGS: CT Brain Perfusion Findings: ASPECTS: 8. CBF (<30%) Volume: 4mL Perfusion (Tmax>6.0s) volume: 40mL (hypoperfusion index 0.2). Mismatch Volume: 36mL Infarction Location:Left MCA operculum and subjacent white matter. CTA NECK Skeleton: Occasional dental caries on the right. No acute osseous abnormality identified. Upper chest: There are scattered bilateral lung nodules in the upper lobes and visible superior segment lower lobes. Some of these demonstrate early cavitation (series 1, image 140. The largest visible area of involvement is at least 2 cm in the superior segment of the right lower lobe. No pleural effusion. No superior mediastinal lymphadenopathy. Visible central pulmonary arteries are patent. Other neck: Negative. Aortic arch: 3 vessel arch configuration with no arch atherosclerosis. Incidental small venous collaterals are enhancing in the superior mediastinum prevascular space. Grossly normal visible SVC. Right carotid system: Negative. Left carotid system: Negative. Vertebral arteries: Normal proximal right subclavian artery and right vertebral  artery origin. Patent and normal right vertebral to the skull base. Normal proximal left subclavian artery and left vertebral artery origin. Patent and normal left vertebral artery to the skull base. CTA HEAD Posterior circulation: Patent and normal vertebral artery V4 segments. Patent vertebrobasilar junction and basilar artery without stenosis. Patent SCA and PCA origins. Posterior communicating arteries are diminutive or absent. Bilateral PCA branches are within normal limits. Anterior circulation: Both ICA siphons are patent. No siphon plaque or stenosis. Patent carotid termini, MCA and ACA origins. Anterior communicating artery and bilateral ACA branches are within normal limits. Right MCA M1 segment and bifurcation are patent without stenosis. Right MCA branches appear within normal limits. The left MCA M1 segment and bifurcation are patent. There is of very proximal left M3 branch occlusion identified on series 10, image 30 and on axial series 5 images 102-100. This is at the level of the mid insula. No other left MCA branch occlusion is identified. Venous sinuses: Patent, the right transverse and sigmoid sinuses appear dominant. Anatomic variants: None. Review of the MIP images confirms the above findings IMPRESSION: 1. Positive for a Left MCA proximal M3 brandch occlusion as seen on CTA series 10, image 30. 2. Positive also for multiple bilateral upper lung nodules, some with early cavitation. Consider Septic Emboli. 3. The above was discussed by telephone with Dr. Arther Dames on 04/01/2019 at 2007 hours. 4. CTP detects a very small 4 mL core infarct with 40 mL of Left MCA territory penumbra. 5. No atherosclerosis or large vessel abnormality identified. Electronically Signed   By: Odessa Fleming M.D.   On: 04/01/2019 20:20   DG Chest Port 1 View  Result Date: 04/03/2019 CLINICAL DATA:  Stroke.  Respiratory failure. EXAM: PORTABLE CHEST 1 VIEW COMPARISON:  04/02/2019.  CT 04/02/2019. FINDINGS: Endotracheal  tube, NG tube, left subclavian line in stable position. Heart size stable. Progressive bilateral pulmonary infiltrates/edema. Reference is made to prior chest CT report for discussion of pulmonary nodularity present. No pleural effusion or pneumothorax. No acute bony abnormality. IMPRESSION: 1. Endotracheal tube, NG tube, left subclavian line stable position. 2.  Progressive bilateral pulmonary infiltrates/edema. Electronically Signed   By: Maisie Fus  Register   On: 04/03/2019 06:44   DG CHEST PORT 1 VIEW  Result Date: 04/02/2019 CLINICAL DATA:  Some Central line placement EXAM: PORTABLE CHEST 1 VIEW COMPARISON:  04/02/2019 FINDINGS: Left subclavian central line in place with the tip in the upper right atrium. No  pneumothorax. Endotracheal tube and NG tube are unchanged. Left base atelectasis. Right lung clear. No effusions. Heart is normal size. IMPRESSION: Interval placement of left central line with the tip in the upper right atrium. No pneumothorax. Left base atelectasis. Electronically Signed   By: Charlett Nose M.D.   On: 04/02/2019 22:49   Portable Chest x-ray  Result Date: 04/02/2019 CLINICAL DATA:  ET tube EXAM: PORTABLE CHEST 1 VIEW COMPARISON:  None. FINDINGS: ET tube is 3.4 cm above the carina. NG tube is in the stomach. Heart is normal size. Minimal left base atelectasis. Right lung clear. No effusions. No acute bony abnormality. IMPRESSION: Minimal left base atelectasis. Electronically Signed   By: Charlett Nose M.D.   On: 04/02/2019 00:58   DG Abd Portable 1V  Result Date: 04/02/2019 CLINICAL DATA:  Orogastric tube placement. EXAM: PORTABLE ABDOMEN - 1 VIEW COMPARISON:  No recent. FINDINGS: Orogastric tube tip and side hole noted over the stomach. Nonspecific air-filled loops of small bowel noted. Colonic gas pattern is unremarkable. No free air. No acute bony abnormality. IMPRESSION: Orogastric tube tip and side hole noted over the stomach. Electronically Signed   By: Maisie Fus  Register   On:  04/02/2019 06:04   VAS [place] GROIN PSEUDOANEURYSM  Result Date: 04/02/2019  ARTERIAL PSEUDOANEURYSM  Exam: Right groin Indications: Patient complains of Hematoma, bruising and groin pain. History: 04/01/2019 - IR WITH ANESTHESIA. Limitations: Poor ultrasound/tissue interface and patient positioning. Comparison Study: No prior studies. Performing Technologist: Chanda Busing RVT  Examination Guidelines: A complete evaluation includes B-mode imaging, spectral Doppler, color Doppler, and power Doppler as needed of all accessible portions of each vessel. Bilateral testing is considered an integral part of a complete examination. Limited examinations for reoccurring indications may be performed as noted.  Summary: No evidence of pseudoaneurysm, or AVF.  Diagnosing physician: Lemar Livings MD Electronically signed by Lemar Livings MD on 04/02/2019 at 4:40:40 PM.   --------------------------------------------------------------------------------    Final    ECHOCARDIOGRAM COMPLETE  Result Date: 04/02/2019    ECHOCARDIOGRAM REPORT   Patient Name:   UDELL BLASINGAME [facility] Date of Exam: 04/02/2019 Medical Rec #:  [id number]             Height:       65.0 in Accession #:    [id number]            Weight:       159.4 lb Date of Birth:  02-Nov-1990             BSA:          1.796 m Patient Age:    28 years              BP:           121/69 mmHg Patient Gender: F                     HR:           76 bpm. Exam Location:  Inpatient Procedure: 2D Echo, Cardiac Doppler and Color Doppler Indications:    Stroke 434.91 / I163.9  History:        Patient has no prior history of Echocardiogram examinations.  Sonographer:    Tiffany Dance Referring Phys: [id number] R AROOR  Sonographer Comments: Echo performed with patient supine and on artificial respirator. IMPRESSIONS  1. Normal LV function; moderate AI; mild MR and TR.  2. Left ventricular ejection fraction, by estimation, is 55 to 60%. The left  ventricle has normal function. The left  ventricle has no regional wall motion abnormalities. Left ventricular diastolic parameters were normal.  3. Right ventricular systolic function is normal. The right ventricular size is normal. There is mildly elevated pulmonary artery systolic pressure.  4. The mitral valve is normal in structure. Mild mitral valve regurgitation. No evidence of mitral stenosis.  5. The aortic valve is tricuspid. Aortic valve regurgitation is moderate. Mild aortic valve sclerosis is present, with no evidence of aortic valve stenosis.  6. The inferior vena cava is normal in size with greater than 50% respiratory variability, suggesting right atrial pressure of 3 mmHg. FINDINGS  Left Ventricle: Left ventricular ejection fraction, by estimation, is 55 to 60%. The left ventricle has normal function. The left ventricle has no regional wall motion abnormalities. The left ventricular internal cavity size was normal in size. There is  no left ventricular hypertrophy. Left ventricular diastolic parameters were normal. Right Ventricle: The right ventricular size is normal.Right ventricular systolic function is normal. There is mildly elevated pulmonary artery systolic pressure. The tricuspid regurgitant velocity is 2.44 m/s, and with an assumed right atrial pressure of  8 mmHg, the estimated right ventricular systolic pressure is 31.8 mmHg. Left Atrium: Left atrial size was normal in size. Right Atrium: Right atrial size was normal in size. Pericardium: There is no evidence of pericardial effusion. Mitral Valve: The mitral valve is normal in structure. Normal mobility of the mitral valve leaflets. Mild mitral valve regurgitation. No evidence of mitral valve stenosis. Tricuspid Valve: The tricuspid valve is normal in structure. Tricuspid valve regurgitation is mild . No evidence of tricuspid stenosis. Aortic Valve: The aortic valve is tricuspid. Aortic valve regurgitation is moderate. Aortic regurgitation PHT measures 408 msec. Mild aortic  valve sclerosis is present, with no evidence of aortic valve stenosis. Pulmonic Valve: The pulmonic valve was normal in structure. Pulmonic valve regurgitation is not visualized. No evidence of pulmonic stenosis. Aorta: The aortic root is normal in size and structure. Venous: The inferior vena cava is normal in size with greater than 50% respiratory variability, suggesting right atrial pressure of 3 mmHg. IAS/Shunts: No atrial level shunt detected by color flow Doppler. Additional Comments: Normal LV function; moderate AI; mild MR and TR.  LEFT VENTRICLE PLAX 2D LVIDd:         4.94 cm  Diastology LVIDs:         3.70 cm  LV e' lateral:   18.50 cm/s LV PW:         0.81 cm  LV E/e' lateral: 5.5 LV IVS:        0.98 cm  LV e' medial:    12.80 cm/s LVOT diam:     1.90 cm  LV E/e' medial:  7.9 LV SV:         61 LV SV Index:   34 LVOT Area:     2.84 cm  RIGHT VENTRICLE            IVC RV Basal diam:  2.62 cm    IVC diam: 1.74 cm RV S prime:     8.27 cm/s TAPSE (M-mode): 1.5 cm LEFT ATRIUM             Index       RIGHT ATRIUM           Index LA diam:        3.80 cm 2.12 cm/m  RA Area:     14.30 cm LA Vol (A2C):  71.6 ml 39.86 ml/m RA Volume:   39.30 ml  21.88 ml/m LA Vol (A4C):   43.7 ml 24.33 ml/m LA Biplane Vol: 56.1 ml 31.23 ml/m  AORTIC VALVE LVOT Vmax:   107.00 cm/s LVOT Vmean:  72.900 cm/s LVOT VTI:    0.214 m AI PHT:      408 msec  AORTA Ao Root diam: 2.70 cm Ao Asc diam:  2.20 cm MITRAL VALVE                TRICUSPID VALVE MV Area (PHT): 4.23 cm     TR Peak grad:   23.8 mmHg MV Decel Time: 180 msec     TR Vmax:        244.00 cm/s MV E velocity: 101.50 cm/s                             SHUNTS                             Systemic VTI:  0.21 m                             Systemic Diam: 1.90 cm Kirk Ruths MD Electronically signed by Kirk Ruths MD Signature Date/Time: 04/02/2019/1:19:08 PM    Final    CT HEAD CODE STROKE WO CONTRAST  Result Date: 04/01/2019 CLINICAL DATA:  Code stroke. 29 year old female  with right facial droop and aphasia. EXAM: CT HEAD WITHOUT CONTRAST TECHNIQUE: Contiguous axial images were obtained from the base of the skull through the vertex without intravenous contrast. COMPARISON:  None. FINDINGS: Brain: Normal cerebral volume. No acute intracranial hemorrhage identified. No midline shift, mass effect, or evidence of intracranial mass lesion. No ventriculomegaly. Asymmetric hypodensity at the left insula on series 2, image 15. But no other cytotoxic edema identified in the left hemisphere. There is perhaps subtle asymmetric white matter hypodensity such as in the left middle frontal gyrus on coronal image 33. Normal gray-white matter differentiation in the right hemisphere and the posterior fossa. Vascular: No suspicious intracranial vascular hyperdensity. Skull: Negative. Sinuses/Orbits: Visualized paranasal sinuses and mastoids are clear. Other: Leftward gaze. Visualized scalp soft tissues are within normal limits. ASPECTS [facility] Stroke Program Early CT Score) - Ganglionic level infarction (caudate, lentiform nuclei, internal capsule, insula, M1-M3 cortex): 6 - Supraganglionic infarction (M4-M6 cortex): 2 Total score (0-10 with 10 being normal): 8 IMPRESSION: 1. Leftward gaze deviation with mild hypodensity in the left insula, and also perhaps involving some white matter in the middle frontal gyrus. ASPECTS 8. No associated hemorrhage or mass effect. 2. These results were communicated to Dr. Lorraine Lax at 8:01 pm on 04/01/2019 by text page via the [facility] messaging system. Electronically Signed   By: Genevie Ann M.D.   On: 04/01/2019 20:04   VAS [place] LOWER EXTREMITY VENOUS (DVT)  Result Date: 04/02/2019  Lower Venous DVTStudy Indications: Stroke.  Risk Factors: None identified. Limitations: Poor ultrasound/tissue interface and patient positioning. Comparison Study: No prior studies. Performing Technologist: Oliver Hum RVT  Examination Guidelines: A complete evaluation includes B-mode imaging,  spectral Doppler, color Doppler, and power Doppler as needed of all accessible portions of each vessel. Bilateral testing is considered an integral part of a complete examination. Limited examinations for reoccurring indications may be performed as noted. The reflux portion of the exam is performed with the patient in reverse  Trendelenburg.  +---------+---------------+---------+-----------+----------+--------------+ RIGHT    CompressibilityPhasicitySpontaneityPropertiesThrombus Aging +---------+---------------+---------+-----------+----------+--------------+ CFV      Partial        Yes      Yes                  Acute          +---------+---------------+---------+-----------+----------+--------------+ SFJ      Full                                                        +---------+---------------+---------+-----------+----------+--------------+ FV Prox  Full                                                        +---------+---------------+---------+-----------+----------+--------------+ FV Mid   Full                                                        +---------+---------------+---------+-----------+----------+--------------+ FV DistalFull                                                        +---------+---------------+---------+-----------+----------+--------------+ PFV      Full                                                        +---------+---------------+---------+-----------+----------+--------------+ POP      Full           Yes      Yes                                 +---------+---------------+---------+-----------+----------+--------------+ PTV      Full                                                        +---------+---------------+---------+-----------+----------+--------------+ PERO     Full                                                        +---------+---------------+---------+-----------+----------+--------------+ EIV                      Yes      Yes                                 +---------+---------------+---------+-----------+----------+--------------+   +---------+---------------+---------+-----------+----------+--------------+  LEFT     CompressibilityPhasicitySpontaneityPropertiesThrombus Aging +---------+---------------+---------+-----------+----------+--------------+ CFV      Full           Yes      Yes                                 +---------+---------------+---------+-----------+----------+--------------+ SFJ      Full                                                        +---------+---------------+---------+-----------+----------+--------------+ FV Prox  Full                                                        +---------+---------------+---------+-----------+----------+--------------+ FV Mid   Full                                                        +---------+---------------+---------+-----------+----------+--------------+ FV DistalFull                                                        +---------+---------------+---------+-----------+----------+--------------+ PFV      Full                                                        +---------+---------------+---------+-----------+----------+--------------+ POP      Full           Yes      Yes                                 +---------+---------------+---------+-----------+----------+--------------+ PTV      Full                                                        +---------+---------------+---------+-----------+----------+--------------+ PERO     Full                                                        +---------+---------------+---------+-----------+----------+--------------+     Summary: RIGHT: - Findings consistent with acute deep vein thrombosis involving the right common femoral vein. - No cystic structure found in the popliteal fossa.  LEFT: - There is no evidence of deep vein  thrombosis in the lower extremity.  - No  cystic structure found in the popliteal fossa.  *See table(s) above for measurements and observations. Electronically signed by Lemar Livings MD on 04/02/2019 at 4:40:15 PM.    Final     Labs:  CBC: Recent Labs    04/02/19 1223 04/02/19 1659 04/02/19 2018 04/03/19 0502  WBC 26.4* 23.5* 27.5* 25.6*  HGB 9.3* 10.2* 9.9* 8.6*  HCT 27.5* 30.4* 29.0* 26.5*  PLT 257 219 270 314    COAGS: Recent Labs    04/01/19 1943  INR 1.5*  APTT 31    BMP: Recent Labs    04/01/19 1943 04/01/19 1943 04/01/19 2003 04/02/19 0013 04/02/19 0032 04/03/19 0502  NA 129*   < > 127* 130* 132* 141  K 4.5   < > 4.0 3.8 3.7 4.1  CL 93*  --  92* 95*  --  112*  CO2 24  --   --  24  --  21*  GLUCOSE 118*  --  118* 133*  --  104*  BUN 6  --  9 5*  --  10  CALCIUM 8.5*  --   --  7.8*  --  7.7*  CREATININE 0.76  --  0.70 0.64  --  0.60  GFRNONAA >60  --   --  >60  --  >60  GFRAA >60  --   --  >60  --  >60   < > = values in this interval not displayed.    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 Corliss Skains. Patient's condition stable- remains intubated/sedated, opens eyes to voice and follows some simple commands, moves LLE to command but no movements of other extremities. Right groin incision soft without active bleeding or hematoma, distal pulses palpable bilaterally with Doppler, VAS [place] negative for pseudoaneurysm. Continue taking Brilinta 90 mg twice daily and Aspirin 81 mg once daily. Further plans per neurology/CCM- appreciate and agree with management.   RLE DVT (involving the right  CFV), unable to be anticoagulated due to history of large right groin hematoma, endocarditis, septic emboli, and on Brilinta due to above. Plan for image-guided IVCF placement today in IR. Patient is NPO. Afebrile. Ok to proceed with Brilinta use per Dr. Deanne Coffer.  Risks and benefits discussed with the patient including, but not limited to bleeding, infection, contrast induced renal failure, filter fracture or migration which can lead to emergency surgery or even death, strut penetration with damage or irritation to adjacent structures and caval thrombosis. All of the patient's questions were answered, patient is agreeable to proceed. Consent obtained by patient's mother, Purity Herber, via telephone- signed and in IR control room.   NIR to follow.   Electronically Signed: Elwin Mocha, PA-C 04/03/2019, 9:19 AM   I spent a total of 35 Minutes at the the patient's bedside AND on the patient's hospital floor or unit, greater than 50% of which was counseling/coordinating care for stroke follow-up and IVCF placement.

## 2019-04-03 NOTE — Progress Notes (Signed)
Patient monitor indicates potential stE, 12-lead EKG acquired and placed in patient's chart. EKG indicates NSR with sinus arryhthmia, CCM at bedside and aware.  Aris Lot, RN

## 2019-04-03 NOTE — Progress Notes (Signed)
OT Cancellation Note  Patient Details Name: Merin Chloris Marcoux MRN: [id number] DOB: 17-Feb-1990   Cancelled Treatment:    Reason Eval/Treat Not Completed: Medical issues which prohibited therapy;Patient not medically ready(Pt with RLE DVT, awaiting IVC filter placement today.) Will return as schedule allows. Thank you.  Gaelle Adriance M Rebel Willcutt Eric Morganti MSOT, OTR/L Acute Rehab Pager: [phone] Office: [phone] 04/03/2019, 1:05 PM

## 2019-04-03 NOTE — Progress Notes (Signed)
RT NOTE: RT transported patient on ventilator from room 4N32 to IR and back to room 4N32 with no complications. Vitals are stable. RT will continue to monitor.

## 2019-04-03 NOTE — Progress Notes (Signed)
SLP Cancellation Note  Patient Details Name: Lauren Reyes MRN: [id number] DOB: December 13, 1990   Cancelled treatment:       Reason Eval/Treat Not Completed: Patient not medically ready. Following for readiness   Claudine Mouton 04/03/2019, 7:45 AM

## 2019-04-03 NOTE — Procedures (Signed)
Interventional Radiology Procedure Note  Procedure: IVC filter placement  Complications: None  Estimated Blood Loss: < 10 mL  Findings: Right IJ access. IVC normally patent. [place] retrievable IVC filter deployed in infrarenal IVC.  Lauren Reyes. Fredia Sorrow, M.D Pager:  [phone]

## 2019-04-03 NOTE — Progress Notes (Signed)
PT Cancellation Note  Patient Details Name: Sravya Floride Hutmacher MRN: [id number] DOB: 09/03/1990   Cancelled Treatment:    Reason Eval/Treat Not Completed: Patient at procedure or test/unavailable;Medical issues which prohibited therapy. Pt with RLE DVT, awaiting IVC filter placement today. PT will hold evaluation until IVC filter placed and pt medically ready to participate in PT session.   Arlyss Gandy 04/03/2019, 10:11 AM

## 2019-04-04 ENCOUNTER — Inpatient Hospital Stay (HOSPITAL_COMMUNITY): Payer: Medicaid Other

## 2019-04-04 LAB — CBC
HCT: 20.4 % — ABNORMAL LOW (ref 36.0–46.0)
HCT: 26.2 % — ABNORMAL LOW (ref 36.0–46.0)
Hemoglobin: 6.7 g/dL — CL (ref 12.0–15.0)
Hemoglobin: 8.7 g/dL — ABNORMAL LOW (ref 12.0–15.0)
MCH: 25.7 pg — ABNORMAL LOW (ref 26.0–34.0)
MCH: 26.5 pg (ref 26.0–34.0)
MCHC: 32.8 g/dL (ref 30.0–36.0)
MCHC: 33.2 g/dL (ref 30.0–36.0)
MCV: 78.2 fL — ABNORMAL LOW (ref 80.0–100.0)
MCV: 79.9 fL — ABNORMAL LOW (ref 80.0–100.0)
Platelets: 349 10*3/uL (ref 150–400)
Platelets: 326 10*3/uL (ref 150–400)
RBC: 2.61 MIL/uL — ABNORMAL LOW (ref 3.87–5.11)
RBC: 3.28 MIL/uL — ABNORMAL LOW (ref 3.87–5.11)
RDW: 14.9 % (ref 11.5–15.5)
RDW: 15.1 % (ref 11.5–15.5)
WBC: 29.5 10*3/uL — ABNORMAL HIGH (ref 4.0–10.5)
WBC: 27.8 10*3/uL — ABNORMAL HIGH (ref 4.0–10.5)
nRBC: 0 % (ref 0.0–0.2)
nRBC: 0.1 % (ref 0.0–0.2)

## 2019-04-04 LAB — BASIC METABOLIC PANEL
Anion gap: 11 (ref 5–15)
Anion gap: 11 (ref 5–15)
BUN: 7 mg/dL (ref 6–20)
BUN: 7 mg/dL (ref 6–20)
CO2: 21 mmol/L — ABNORMAL LOW (ref 22–32)
CO2: 21 mmol/L — ABNORMAL LOW (ref 22–32)
Calcium: 7.7 mg/dL — ABNORMAL LOW (ref 8.9–10.3)
Calcium: 7.8 mg/dL — ABNORMAL LOW (ref 8.9–10.3)
Chloride: 110 mmol/L (ref 98–111)
Chloride: 112 mmol/L — ABNORMAL HIGH (ref 98–111)
Creatinine, Ser: 0.61 mg/dL (ref 0.44–1.00)
Creatinine, Ser: 0.59 mg/dL (ref 0.44–1.00)
GFR calc Af Amer: >60 mL/min (ref >60)
GFR calc Af Amer: >60 mL/min (ref >60)
GFR calc non Af Amer: >60 mL/min (ref >60)
GFR calc non Af Amer: >60 mL/min (ref >60)
Glucose, Bld: 101 mg/dL — ABNORMAL HIGH (ref 70–99)
Glucose, Bld: 101 mg/dL — ABNORMAL HIGH (ref 70–99)
Potassium: 3.3 mmol/L — ABNORMAL LOW (ref 3.5–5.1)
Potassium: 3.6 mmol/L (ref 3.5–5.1)
Sodium: 142 mmol/L (ref 135–145)
Sodium: 144 mmol/L (ref 135–145)

## 2019-04-04 LAB — CULTURE, RESPIRATORY W GRAM STAIN

## 2019-04-04 LAB — DRVVT CONFIRM: dRVVT Confirm: 1.7 ratio — ABNORMAL HIGH (ref 0.8–1.2)

## 2019-04-04 LAB — DRVVT MIX: dRVVT Mix: 52.9 s — ABNORMAL HIGH (ref 0.0–40.4)

## 2019-04-04 LAB — PREPARE RBC (CROSSMATCH)

## 2019-04-04 LAB — HEMOGLOBIN AND HEMATOCRIT, BLOOD
HCT: 19.9 % — ABNORMAL LOW (ref 36.0–46.0)
Hemoglobin: 6.5 g/dL — CL (ref 12.0–15.0)

## 2019-04-04 LAB — PROCALCITONIN: Procalcitonin: 1.51 ng/mL

## 2019-04-04 MED ORDER — SODIUM CHLORIDE 0.9% IV SOLUTION: Freq: Once | INTRAVENOUS | Status: DC

## 2019-04-04 MED ORDER — SODIUM CHLORIDE 0.9% IV SOLUTION: Freq: Once | INTRAVENOUS | Status: AC

## 2019-04-04 MED ORDER — IOHEXOL 300 MG/ML  SOLN
100.0000 mL | Freq: Once | INTRAMUSCULAR | Status: AC | PRN
  Administered 2019-04-04: 100 mL via INTRAVENOUS

## 2019-04-04 MED ORDER — POTASSIUM CHLORIDE 10 MEQ/50ML IV SOLN
10.0000 meq | INTRAVENOUS | Status: AC
  Administered 2019-04-04 (×2): 10 meq via INTRAVENOUS
  Filled 2019-04-04 (×2): qty 50

## 2019-04-04 MED ORDER — FUROSEMIDE 10 MG/ML IJ SOLN
20.0000 mg | Freq: Once | INTRAMUSCULAR | Status: DC
  Filled 2019-04-04 (×2): qty 2

## 2019-04-04 MED ORDER — CEFAZOLIN SODIUM-DEXTROSE 2-4 GM/100ML-% IV SOLN
2.0000 g | Freq: Three times a day (TID) | INTRAVENOUS | Status: DC
  Administered 2019-04-04 – 2019-04-07 (×10): 2 g via INTRAVENOUS
  Filled 2019-04-04 (×13): qty 100

## 2019-04-04 NOTE — Progress Notes (Signed)
She is s/p extubation and following some commands.  RUE remains weakest.  She s/p 2 U PRBC transfusion and Hg has increased to 8.7 from 6.5.    GI saw patient and have elected not to do EGD based on no blood in the NG tube aspirate.    TEE pending.    Weston Settle, MS, MD

## 2019-04-04 NOTE — Progress Notes (Signed)
Wasted 14mL Fentanyl in sharps with Jobe Gibbon RN.

## 2019-04-04 NOTE — Evaluation (Signed)
Occupational Therapy Evaluation Patient Details Name: Lauren Reyes MRN: [id number] DOB: Feb 20, 1990 Today's Date: 04/04/2019    History of Present Illness 29 year old female with suspected IV drug abuse who was being interviewed by police tonight and developed right-sided facial weakness, aphasia, and R weakness.  She was brought by EMS to the emergency room work-up revealed occluded left MCA. Revascularization performed by IR. CT head and neck demonstrate multiple small cavitary abscesses in the upper lobes bilaterally. IVC filter placement for RLE DVT on 3/26.   Clinical Impression   Pt admitted with above. She demonstrates the below listed deficits and will benefit from continued OT to maximize safety and independence with BADLs.  Eval limited due to increased RR with attempts at following commands.  She followed simple one step motor commands ~50% of the time.  She is moving all 4 extremities.  Eval limited to bed level and she requires total A for all aspects of ADLs.  Pt on vent with 30% Fi02, 5PEEP, with sats >97%, RR 26-44, and HR 110's - 122.   Pt's sisters present and report pt is estranged, but her older sister saw pt the Monday PTA, and pt appeared normal.  They appear supportive and report their mother will be back in the country in the next few weeks, but amount of assist available at discharge is currently unknown.  Anticipate pt will require post acute rehab of some sort.  Will follow.       Follow Up Recommendations  CIR;Supervision/Assistance - 24 hour    Equipment Recommendations  None recommended by OT    Recommendations for Other Services Rehab consult     Precautions / Restrictions Precautions Precautions: Fall Precaution Comments: intubated, bilateral soft wrist restraints and mits Restrictions Weight Bearing Restrictions: No      Mobility Bed Mobility Overal bed mobility: (functional mobility deferred 2/2 RR and HR)                Transfers                  General transfer comment: unable     Balance                                           ADL either performed or assessed with clinical judgement   ADL Overall ADL's : Needs assistance/impaired                                       General ADL Comments: Pt requires total A for all aspects      Vision   Additional Comments: pt does fixate on therapists on both Lt and Rt side and appears to track past midline inconsistently      Perception Perception Perception Tested?: No   Praxis Praxis Praxis tested?: Not tested    Pertinent Vitals/Pain Pain Assessment: Faces Faces Pain Scale: Hurts a little bit Pain Location: generalized Pain Descriptors / Indicators: Restless Pain Intervention(s): Monitored during session     Hand Dominance     Extremity/Trunk Assessment Upper Extremity Assessment Upper Extremity Assessment: LUE deficits/detail;RUE deficits/detail RUE Deficits / Details: Pt able to initiate elbow flexion and occasional spontaneous elbow extension noted  RUE Coordination: decreased fine motor;decreased gross motor LUE Deficits / Details: Pt able to initiate small excursion of  finger extension and flexion, as well as able to move hand to mouth with max A  LUE Coordination: decreased gross motor;decreased fine motor   Lower Extremity Assessment Lower Extremity Assessment: RLE deficits/detail;LLE deficits/detail RLE Deficits / Details: pt able to lift lower leg off bed to command, crosses legs when restless during session LLE Deficits / Details: pt able to lift lower leg off bed to command, crosses legs when restless during session   Cervical / Trunk Assessment Cervical / Trunk Assessment: Other exceptions(difficult to assess as mobility deferred at this time) Cervical / Trunk Exceptions: Pt able to turn head/neck Lt and Rt and to lift head from bed on command    Communication Communication Communication: Other  (comment)(intubated)   Cognition Arousal/Alertness: Lethargic(arouses to verbal stimuli) Behavior During Therapy: Restless Overall Cognitive Status: Difficult to assess                                 General Comments: pt follows one step commands ~50% of the time when alert. Is able to demonstrate some ability to follow commands with BLE and RUE   General Comments  pt tachy in 110s at rest, up to high 120s with stimulation. Pt RR ranging from mid 20s to high 40s with stimulation of PT and OT, pt RR does return to low 30s with breaks from stimulation and with PT/OT calming of patient. Pt intubated with FiO2 of 30%, PEEP of 5.    Exercises     Shoulder Instructions      Home Living Family/patient expects to be discharged to:: Unsure                                 Additional Comments: pt is estranged from family at baseline, pt's sister reports she saw the patient on monday for the first time in 8 months and patient was at a hotel mobilizing independently, seemed to be herself.      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 [place] last time sister had seen her 8 months ago        OT Problem List: Decreased strength;Decreased range of motion;Decreased activity tolerance;Impaired balance (sitting and/or standing);Impaired vision/perception;Decreased coordination;Decreased cognition;Decreased safety awareness;Decreased knowledge of use of DME or AE;Cardiopulmonary status limiting activity;Impaired UE functional use      OT Treatment/Interventions: Self-care/ADL training;Neuromuscular education;DME and/or AE instruction;Therapeutic activities;Cognitive remediation/compensation;Splinting;Patient/family education;Visual/perceptual remediation/compensation;Balance training    OT Goals(Current goals can be found in the care plan section) Acute Rehab OT Goals Patient Stated Goal:  Family goal to return to independence OT Goal Formulation: With family Time For Goal Achievement: 04/18/19 Potential to Achieve Goals: Good ADL Goals Pt Will Perform Grooming: with min assist;sitting Additional ADL Goal #1: Pt will follow one step commands consistently without cues Additional ADL Goal #2: Pt will maintain EOB sitting with mod A while participating in simple grooming tasks Additional ADL Goal #3: Pt will locate needed ADL items on bedside table with min cues  OT Frequency: Min 2X/week   Barriers to D/C: Decreased caregiver support          Co-evaluation PT/OT/SLP Co-Evaluation/Treatment: Yes Reason for Co-Treatment: Complexity of the patient's impairments (multi-system involvement);For patient/therapist safety;Necessary to address cognition/behavior during functional activity PT goals addressed during session: Strengthening/ROM OT goals addressed during session: Strengthening/ROM  AM-PAC OT "6 Clicks" Daily Activity     Outcome Measure Help from another person eating meals?: Total Help from another person taking care of personal grooming?: Total Help from another person toileting, which includes using toliet, bedpan, or urinal?: Total Help from another person bathing (including washing, rinsing, drying)?: Total Help from another person to put on and taking off regular upper body clothing?: Total Help from another person to put on and taking off regular lower body clothing?: Total 6 Click Score: 6   End of Session Equipment Utilized During Treatment: Oxygen Nurse Communication: Mobility status  Activity Tolerance: Treatment limited secondary to medical complications (Comment);Other (comment)(restlessness ) Patient left: in bed;with call bell/phone within reach;with bed alarm set;with family/visitor present;with restraints reapplied  OT Visit Diagnosis: Unsteadiness on feet (R26.81);Muscle weakness (generalized) (M62.81);Hemiplegia and hemiparesis;Cognitive  communication deficit (R41.841) Symptoms and signs involving cognitive functions: Cerebral infarction Hemiplegia - Right/Left: Right Hemiplegia - caused by: Cerebral infarction                Time: 1500-1531 OT Time Calculation (min): 31 min Charges:  OT General Charges $OT Visit: 1 Visit OT Evaluation $OT Eval High Complexity: 1 High  Eber Jones., OTR/L Acute Rehabilitation Services Pager [phone] Office [phone]   Jeani Hawking M 04/04/2019, 6:40 PM

## 2019-04-04 NOTE — Procedures (Signed)
Extubation Procedure Note  Patient Details:   Name: Lauren Reyes DOB: [birth year] MRN: [id number]   Airway Documentation:  Airway 7.5 mm (Active)  Secured at (cm) 22 cm 04/04/19 1159  Measured From Lips 04/04/19 1159  Secured Location Left 04/04/19 1159  Secured By [employer] 04/04/19 1159  Tube Holder Repositioned Yes 04/04/19 1159  Cuff Pressure (cm H2O) 26 cm H2O 04/03/19 2100  Site Condition Dry 04/04/19 1159   Vent end date: (not recorded) Vent end time: (not recorded)   Evaluation  O2 sats: stable throughout Complications: No apparent complications Patient did tolerate procedure well. Bilateral Breath Sounds: Clear   No    Morley Kos 04/04/2019, 3:43 PM

## 2019-04-04 NOTE — Progress Notes (Signed)
Rehab Admissions Coordinator Note:  Patient was screened by Stephania Fragmin for appropriateness for an Inpatient Acute Rehab Consult.  At this time, we are recommending Inpatient Rehab consult.  I will place an order per our protocol.   Stephania Fragmin 04/04/2019, 4:13 PM  I can be reached at [phone].

## 2019-04-04 NOTE — Progress Notes (Signed)
SLP Cancellation Note  Patient Details Name: Ladona Remington Skalsky MRN: [id number] DOB: [birth year]   Cancelled treatment:       Reason Eval/Treat Not Completed: Patient not medically ready; pt remains intubated; ST will continue efforts.   Tressie Stalker, M.S., CCC-SLP 04/04/2019, 11:23 AM

## 2019-04-04 NOTE — Progress Notes (Signed)
Was informed patient continues to be dropping Hgb., 11 on admisison now 6.7- no blood in stools. No groin hematoma. Patient on Brillinta. Will order CT abd/pelvis, repeat Hb and order 1 unit PRBC, to be transfused if rpt Hb <7.  Stroke team to follow results and determine need for transfusion.

## 2019-04-04 NOTE — Progress Notes (Signed)
STROKE TEAM PROGRESS NOTE   INTERVAL HISTORY Patient has had steady drop in Hg down to 6.5.  CT Abdomen and Pelvis did not show any retroperitoneal hematoma.  I started 2 Units PRBC transfusion this morning.  I have held Brilinta temporarily.  No hematoma at the right groin area.    Currently on Neo 25 mcg/kg and Precedex 1.2 mcg/kg/hour for sedation.     Vitals:   04/04/19 0930 04/04/19 0945 04/04/19 0949 04/04/19 1004  BP: (!) 102/51 (!) 111/59  (!) 111/57  Pulse: 97 97 87 95  Resp: (!) 25 (!) 21 13 (!) 23  Temp:  (!) 97.3 F (36.3 C)  97.9 F (36.6 C)  TempSrc:  Axillary  Axillary  SpO2: 100% 100% 97% 100%  Weight:      Height:        CBC:  Recent Labs  Lab 04/01/19 1943 04/01/19 2003 04/02/19 0013 04/02/19 0032 04/03/19 1355 04/03/19 1355 04/04/19 0405 04/04/19 0602  WBC 19.5*   < > 21.4*   < > 29.9*  --  29.5*  --   NEUTROABS 17.0*  --  18.4*  --   --   --   --   --   HGB 11.9*   < > 11.2*   < > 8.4*   < > 6.7* 6.5*  HCT 35.3*   < > 32.5*   < > 25.1*   < > 20.4* 19.9*  MCV 75.4*   < > 75.6*   < > 77.0*  --  78.2*  --   PLT 285   < > 254   < > 315  --  349  --    < > = values in this interval not displayed.    Basic Metabolic Panel:  Recent Labs  Lab 04/02/19 0013 04/02/19 0159 04/03/19 0502 04/04/19 0405  NA   < >  --  141 142  K   < >  --  4.1 3.3*  CL   < >  --  112* 110  CO2   < >  --  21* 21*  GLUCOSE   < >  --  104* 101*  BUN   < >  --  10 7  CREATININE   < >  --  0.60 0.61  CALCIUM   < >  --  7.7* 7.7*  MG  --  1.9 2.0  --   PHOS  --   --  3.3  --    < > = values in this interval not displayed.   Lipid Panel:     Component Value Date/Time   CHOL 163 04/02/2019 0012   TRIG 258 (H) 04/02/2019 0012   HDL <10 (L) 04/02/2019 0012   CHOLHDL NOT CALCULATED 04/02/2019 0012   VLDL 52 (H) 04/02/2019 0012   LDLCALC NOT CALCULATED 04/02/2019 0012   HgbA1c:  Lab Results  Component Value Date   HGBA1C 5.9 (H) 04/02/2019   Urine Drug Screen:      Component Value Date/Time   LABOPIA NONE DETECTED 04/01/2019 2349   COCAINSCRNUR NONE DETECTED 04/01/2019 2349   LABBENZ NONE DETECTED 04/01/2019 2349   AMPHETMU POSITIVE (A) 04/01/2019 2349   THCU POSITIVE (A) 04/01/2019 2349   LABBARB NONE DETECTED 04/01/2019 2349    Alcohol Level No results found for: ETH  IMAGING  CT CHEST WO CONTRAST 04/02/2019 IMPRESSION:  1. Multifocal nodularity in both lungs, largest areas in the superior segment of the right lower lobe measuring 2  x 1.9 cm. Similar smaller nodules showing more central soft tissue attenuation and ill-defined ground-glass margins scattered about the chest. Differential diagnosis includes multifocal pneumonia and would include viral or atypical processes, even COVID-19 infection given the peripheral nature of many of these nodules. Septic emboli and metastatic disease are also considered, close follow-up is suggested.  2. Mild cardiomegaly.  3. Heterogeneous appearance of the nephrogram bilaterally in the setting of recent angiography and thrombectomy. Continued correlation with renal function is suggested.  4. Endotracheal tube terminates in the mid trachea. Gastric tube in the stomach.  5. Triangular marbled fatty and soft tissue density in the anterior mediastinum compatible with residual thymic tissue.    MR BRAIN W WO CONTRAST MR ANGIO HEAD WO CONTRAST 04/02/2019 IMPRESSION:  1. Intracranial MRA is negative for vessel occlusion. The left ICA stent is patent. There is mild to moderate irregularity of the left MCA M1 with some stenosis, and mild similar irregularity of the distal ACAs.  2. Small 2-3 cm confluent infarct at the Left Insula with Heidelberg Classification 1b petechial hemorrhage.  3. But numerous smaller acute infarcts scattered throughout the brain. Deep gray nuclei and posterior fossa relatively spared. Several other areas of similar petechial hemorrhage. This constellation remains suspicious for septic emboli.   4. Suggestion also of trace subarachnoid hemorrhage. No intracranial mass effect. No intraventricular hemorrhage or ventriculomegaly.   DG Chest Port 1 View 04/03/2019 IMPRESSION:  1. Endotracheal tube, NG tube, left subclavian line stable position.  2.  Progressive bilateral pulmonary infiltrates/edema.   DG CHEST PORT 1 VIEW 04/02/2019 IMPRESSION:  Interval placement of left central line with the tip in the upper right atrium. No pneumothorax. Left base atelectasis.   DG CHEST PORT 1 VIEW 04/04/2019 IMPRESSION: Congestive heart failure. Mild patchy opacity of right mid lung may be due to asymmetric pulmonary edema.   CT Abdomen and Pelvis W Contrast 04/04/19 IMPRESSION: 1. Small amount of hyperdense material around the right common femoral vein and arteries and within the subcutaneous fat likely reflecting a small amount of hemorrhagic fluid likely from recent percutaneous interventions. No retroperitoneal hematoma is present. 2. Wedge-shaped areas of hypodensity within the spleen, most concerning for splenic infarcts. Somewhat striated areas of hypoenhancement throughout bilateral kidneys likely reflecting small areas of infarction given the splenic abnormality.   VAS [place] GROIN PSEUDOANEURYSM 04/02/2019 Summary:  No evidence of pseudoaneurysm, or AVF.   Final    ECHOCARDIOGRAM COMPLETE 04/02/2019 IMPRESSIONS   1. Normal LV function; moderate AI; mild MR and TR.   2. Left ventricular ejection fraction, by estimation, is 55 to 60%. The left ventricle has normal function. The left ventricle has no regional wall motion abnormalities. Left ventricular diastolic parameters were normal.   3. Right ventricular systolic function is normal. The right ventricular size is normal. There is mildly elevated pulmonary artery systolic pressure.   4. The mitral valve is normal in structure. Mild mitral valve regurgitation. No evidence of mitral stenosis.   5. The aortic valve is tricuspid. Aortic  valve regurgitation is moderate. Mild aortic valve sclerosis is present, with no evidence of aortic valve stenosis.   6. The inferior vena cava is normal in size with greater than 50% respiratory variability, suggesting right atrial pressure of 3 mmHg.    VAS [place] LOWER EXTREMITY VENOUS (DVT) 04/02/2019 Summary:  RIGHT: - Findings consistent with acute deep vein thrombosis involving the right common femoral vein. - No cystic structure found in the popliteal fossa.   LEFT: - There  is no evidence of deep vein thrombosis in the lower extremity.  - No cystic structure found in the popliteal fossa Final    Cerebral Angio 04/01/2019 S/P Lt common carotid arteriogram followed by complete revascularization of occluded M3 region of inferior division of Lt MCA  With x 1 pass with solitairex 21mm x 20 mm retriever and penumbra aspiration achieving a TICI 3 revascularization. Placement of a 4.5 mm x 59mm pipeline flow diverter at site of focal dissection associated with a small pseudoaneurysm of distal cervical LT ICA.Marland Kitchen   PHYSICAL EXAM  Temp:  [97.3 F (36.3 C)-99.7 F (37.6 C)] 97.9 F (36.6 C) (03/27 1004) Pulse Rate:  [73-111] 95 (03/27 1004) Resp:  [0-31] 23 (03/27 1004) BP: (102-132)/(50-84) 111/57 (03/27 1004) SpO2:  [97 %-100 %] 100 % (03/27 1004) FiO2 (%):  [30 %] 30 % (03/27 1004)   Opens eyes to voice but a little dysconjugate. Follows some commands.    She does move to command, L>>R, but not briskly. She withdraws to pain in all 4 extremities.    ASSESSMENT/PLAN [name] is a 29 y.o. female with unknown medical history, possible heroin abuse, presenting with aphasia and R sided weakness. tPA not given d/t multiple BUL lung nodules w/ concern for septic embolic. Found to have L M3 occlusion and sent to IR for mechanical thrombectomy.  Stroke:  left MCA infarct s/p IR L M3 occlusion w/ TICI3 revascularization, stent to L cervical ICA over focal dissection small  pseudoaneurysm. 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 ASPECTS 8    CTA head & neck - L MCA M3 branch occlusion. Multiple B upper lung nodules, ? Septic emboli.   CT perfusion - 73mL core infarct w/ 34ml L MCA penumbra  Cerebral angio - L M3 occlusion s/p TICI3 revascularization. Pipeline sent placed at site of focal dissection associated w/ small pseudoaneurysm distal cervical L ICA.   CT head - 3/24 repeat. L MCA contrast staining. Minimal L frontal lobe white matter infarct.   MRI - 04/02/19 - Small 2-3 cm confluent infarct at the Left Insula with Heidelberg Classification 1b petechial hemorrhage. But numerous smaller acute infarcts scattered throughout the brain.  Several other areas of similar petechial hemorrhage. This constellation remains suspicious for septic emboli. Suggestion also of trace subarachnoid hemorrhage.  MRA - negative for vessel occlusion. The left ICA stent is patent.  CXR - 04/04/19 - Congestive heart failure. Mild patchy opacity of right mid lung may be due to asymmetric pulmonary edema  LE Doppler - Right common femoral vein acute DVT  CT Abdomen and Pelvis - 04/04/19 - No retroperitoneal hematoma; however, concerning for possible renal and splenic infarcts.  2D Echo EF 55-60%, no vegetation seen  Will need TEE once stabilized  LDL NOT CALCULATED d/t TG 185. Check direct LDL - 42.9  HgbA1c 5.9  SARS - negative  HIV - non reactive  SCDs for VTE prophylaxis  unknown meds prior to admission, now on aspirin 81 mg daily and Brilinta (ticagrelor) 90 mg bid for stent. (Suggestion also of trace subarachnoid hemorrhage on MRI)  P2Y12 - 115 - for antiplatelet dose adjustment by NIR  Therapy recommendations:  pending   Disposition:  pending   Acute Respiratory Failure  Intubated for IR  left intubated for airway protection  Sedated  Wean to extubation as  able  CCM on board  Lung Nodules,  possible septic emboli ? infective endocarditis Fever   CT chest B lung multifocal nodularity, largest superior RLL. Dif includes: PNA, COVID, septic emboli and mets. Mild cardiomegaly. Heterogenous B nephrogram. ET in mid trachea. GT in stomach. Tissue in mediastinum c/w residual thymic tissue.  WBC 19.5->21.4->26.4->25.6->29.5  Temperature - 98.1->97.9  No nuchal rigidity - less likely meningitis - not LP candidate given on brilinta - on cefepime and vanco for coverage  Zosyn 3/25>>3/25->off  Vanc 3/25>>off  Cefepime 3/25>>  Blood Cx no growth day 1  Sputum Cx - MODERATE STAPHYLOCOCCUS AUREUS SUSCEPTIBILITIES TO FOLLOW   DVT  LE venous doppler - right common femoral vein DVT  Not candidate for anticoagulation due to large right groin hematoma and possible endocarditis  IR consulted for IVC filter - spoke with IR PA - IVC filter planned for today (Saturday)  Substance abuse, Possible IVDU Sinus Tachycardia, possible withdrawal   UDS:  THC POSITIVE, Amphetamines POSITIVE  Suspicious for heroin use.   Multiple needle marks b/l UEs, L>R.   Fluid bolus  Fentanyl gtt  CIWA protocol  R groin hematoma  Sheath removed post IR w/ angioseal placed  New R groin hematoma on rounds  CT pelvis large diffuse hematoma anterior R groin and upper thigh NOT extending into intra-abd retroperitoneal structures. Limited eval for pseudoaneurysm  LE pseudoaneurysm US - No evidence of pseudoaneurysm, or AVF.   Hgb 11.2->12.2->9.3->8.6  Not candidate for anticoagulation at this time  Hypotension . BP goal per IR x 24h 120-140  . On phenylephrine . Long-term BP goal normotensive  Possible Hyperlipidemia  LDL NOT CALCULATED d/t TG 161258.   Direct LDL 42.9, Goal LDL < 70  Home meds unknown  Now on Lipitor 80  Dysphagia Secondary to stroke NPO Speech on board  Elevated blood hcG  I-stat hcG 7.4  HCG beta chain - neg  Repeat  HcG beta chain - <1  Urine pregnancy test - negative  B12 deficiency   B12 = 175  B12 supplement  Other Stroke Risk Factors  Substance abuse  Other Active Problems  Hyponatremia 127->130->132 - continue IVF->141   Pt's mother lives in [place] - IR PA spoke with her to obtain consent for IVC filter. Pt also has a sister, Lendon Collariffany Matar, who would like to be called with an update on pt's condition. [phone]   Hospital day # 3   Impression:   MRI Brain shows multiple bilateral frontal, parietal, occipital, and left cerebellar infarcts.  The left parietal infarct is the largest.  Several of these lesions have some hemorrhagic conversion.  She is on dual antiplatelet therapy due to the left ICA stent.  She has right leg DVT and IVC filter was placed today to avoid anticoagulation so as to reduce risk of worsening hemorrhage.  Although TTE was normal, awaiting TEE to assess for valvular vegetations from IVDA or intracardiac thrombus.  She seems to have had cardioembolic strokes either due to septic embolism or cardiac thrombus secondary to amphetamine induced arrhythmia.  No atrial fibrillation/flutter has been identified here yet.   Hemoglobin has been steadily dropping.  Source is not clear, but retroperitoneal hematoma has been ruled out.  She may have upper GI ulcer or other lesion.  I am giving her a blood transfusion.  I will consult GI for possible endoscopy.  I will temporarily hold Brilinta, despite risk for ICA stent thrombosis.  I will continue ASA.      She is positive for staphylococcus aureus in the  sputum and on is on Cefepime and Vancomycin.  Blood cultures are negative so far.    Weston Settle, MS, MD       This patient is critically ill due to left MCA stroke, septic emboli, IVDU, DVT, hypotension, fever and at significant risk of neurological worsening, death form recurrent stroke, hemorrhagic conversion, sepsis, septic shock, heart failure, seizure. This  patient's care requires constant monitoring of vital signs, hemodynamics, respiratory and cardiac monitoring, review of multiple databases, neurological assessment, discussion with family, other specialists and medical decision making of high complexity. I spent 45 minutes of neurocritical care time in the care of this patient.   To contact Stroke Continuity provider, please refer to [email or web address]. After hours, contact General Neurology

## 2019-04-04 NOTE — Progress Notes (Signed)
[facility] ADULT ICU REPLACEMENT PROTOCOL FOR AM LAB REPLACEMENT ONLY  The patient does apply for the [facility] Adult ICU Electrolyte Replacment Protocol based on the criteria listed below:   1. Is GFR >/= 40 ml/min? Yes.    Patient's GFR today is >60 2. Is urine output >/= 0.5 ml/kg/hr for the last 6 hours? Yes.   Patient's UOP is 0.79 ml/kg/hr 3. Is BUN < 60 mg/dL? Yes.    Patient's BUN today is 7 4. Abnormal electrolyte  K 3.3 5. Ordered repletion with: protocol 6. If a panic level lab has been reported, has the CCM MD in charge been notified? Yes.  .   Physician:  Marcene Corning 04/04/2019 6:12 AM

## 2019-04-04 NOTE — Progress Notes (Signed)
CRITICAL VALUE ALERT  Critical Value:  Hgb 6.7  Date & Time Notied:  04/04/2019 0415   Provider Notified: Aroor MD  Orders Received/Actions taken: recheck CBC, stat CT abdomen and pelvis

## 2019-04-04 NOTE — Evaluation (Addendum)
Physical Therapy Evaluation Patient Details Name: Lauren Reyes MRN: [id number] DOB: 11/19/1990 Today's Date: 04/04/2019   History of Present Illness  29 year old female with suspected IV drug abuse who was being interviewed by police tonight and developed right-sided facial weakness, aphasia, and R weakness.  She was brought by EMS to the emergency room work-up revealed occluded left MCA. Revascularization performed by IR. CT head and neck demonstrate multiple small cavitary abscesses in the upper lobes bilaterally. IVC filter placement for RLE DVT on 3/26.  Clinical Impression  Pt presents to PT with deficits in functional mobility, strength, power, cognition, gait. Pt intubated and tachycardic and tachypnea during session, limiting PT assessment. Pt does follow commands with all extremities, demonstrating greater LE strength than UE currently. Pt turns head to both sides and visually tracks both directions during session. Pt restless during session but does calm quickly with PT and OT cues. Pt will benefit from continued acute PT POC to improve strength and begin to assess functional mobility when pt is more medically stable and better able to tolerate functional activity.     Follow Up Recommendations CIR;Supervision/Assistance - 24 hour(pending progress, not medically ready for CIR consult)    Equipment Recommendations  (defer to post-acute setting)    Recommendations for Other Services       Precautions / Restrictions Precautions Precautions: Fall Precaution Comments: intubated, bilateral soft wrist restraints and mits Restrictions Weight Bearing Restrictions: No      Mobility  Bed Mobility Overal bed mobility: (functional mobility deferred 2/2 RR and HR)                Transfers                    Ambulation/Gait                Stairs            Wheelchair Mobility    Modified Rankin (Stroke Patients Only) Modified Rankin (Stroke  Patients Only) Pre-Morbid Rankin Score: No symptoms Modified Rankin: Severe disability     Balance                                             Pertinent Vitals/Pain Pain Assessment: Faces Faces Pain Scale: Hurts a little bit Pain Location: generalized Pain Descriptors / Indicators: Restless Pain Intervention(s): Monitored during session    Home Living Family/patient expects to be discharged to:: Unsure                 Additional Comments: pt is estranged from family at baseline, pt's sister reports she saw the patient on monday for the first time in 8 months and patient was at a hotel mobilizing independently, seemed to be herself.    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 [place] last time sister had seen her 8 months ago     Hand Dominance        Extremity/Trunk Assessment   Upper Extremity Assessment Upper Extremity Assessment: Defer to OT evaluation    Lower Extremity Assessment Lower Extremity Assessment: RLE deficits/detail;LLE deficits/detail RLE Deficits / Details: pt able to lift lower leg off bed to command, crosses legs when restless during session LLE Deficits / Details: pt able to lift lower leg off bed to command,  crosses legs when restless during session    Cervical / Trunk Assessment Cervical / Trunk Assessment: (difficult to assess as mobility deferred at this time)  Communication   Communication: Other (comment)(intubated)  Cognition Arousal/Alertness: Lethargic(arouses to verbal stimuli) Behavior During Therapy: Restless Overall Cognitive Status: Difficult to assess                                 General Comments: pt follows one step commands ~50% of the time when alert. Is able to demonstrate some ability to follow commands with BLE and RUE      General Comments General comments (skin integrity, edema, etc.): pt tachy in 110s at  rest, up to high 120s with stimulation. Pt RR ranging from mid 20s to high 40s with stimulation of PT and OT, pt RR does return to low 30s with breaks from stimulation and with PT/OT calming of patient. Pt intubated with FiO2 of 30%, PEEP of 5.    Exercises     Assessment/Plan    PT Assessment Patient needs continued PT services  PT Problem List Decreased strength;Decreased activity tolerance;Decreased balance;Decreased mobility;Decreased knowledge of use of DME;Decreased safety awareness;Decreased knowledge of precautions       PT Treatment Interventions DME instruction;Gait training;Stair training;Therapeutic activities;Functional mobility training;Therapeutic exercise;Balance training;Neuromuscular re-education;Patient/family education    PT Goals (Current goals can be found in the Care Plan section)  Acute Rehab PT Goals Patient Stated Goal: Family goal to return to independence PT Goal Formulation: With family Time For Goal Achievement: 04/18/19 Potential to Achieve Goals: Fair    Frequency Min 4X/week   Barriers to discharge        Co-evaluation PT/OT/SLP Co-Evaluation/Treatment: Yes Reason for Co-Treatment: Complexity of the patient's impairments (multi-system involvement);Necessary to address cognition/behavior during functional activity;For patient/therapist safety PT goals addressed during session: Strengthening/ROM         AM-PAC PT "6 Clicks" Mobility  Outcome Measure Help needed turning from your back to your side while in a flat bed without using bedrails?: Total Help needed moving from lying on your back to sitting on the side of a flat bed without using bedrails?: Total Help needed moving to and from a bed to a chair (including a wheelchair)?: Total Help needed standing up from a chair using your arms (e.g., wheelchair or bedside chair)?: Total Help needed to walk in hospital room?: Total Help needed climbing 3-5 steps with a railing? : Total 6 Click Score:  6    End of Session Equipment Utilized During Treatment: Oxygen Activity Tolerance: Other (comment)(restless, tachypneic on vent) Patient left: in bed;with bed alarm set;with family/visitor present;with restraints reapplied Nurse Communication: Mobility status PT Visit Diagnosis: Muscle weakness (generalized) (M62.81);Other symptoms and signs involving the nervous system (R29.898)    Time: 3267-1245 PT Time Calculation (min) (ACUTE ONLY): 30 min   Charges:   PT Evaluation $PT Eval High Complexity: 1 High          Arlyss Gandy, PT, DPT Acute Rehabilitation Pager: [phone]   Arlyss Gandy 04/04/2019, 3:49 PM

## 2019-04-04 NOTE — Consult Note (Signed)
Subjective:   HPI  The patient is a 29 year old female whose history was reviewed.  She had a stroke.  She had revascularization by neuro interventional radiology with a left ICA stent.  She developed a right groin hematoma.  Her hemoglobin has dropped.  A CT scan of the abdomen was done to look for retroperitoneal hematoma and there was.  We are asked to see her in regards to the possibility of a GI bleed.  There has been no sign of GI bleed.  She has an OG tube in.  We lavaged the OG tube and there is no evidence of blood there is only bilious drainage noted.  No bowel movement reported of melena or hematochezia.    History reviewed. No pertinent past medical history. Past Surgical History:  Procedure Laterality Date  . IR CT HEAD LTD  04/01/2019  . IR INTRAVSC STENT CERV CAROTID W/O EMB-PROT MOD SED INC ANGIO  04/01/2019  . IR IVC FILTER PLMT / S&I /IMG GUID/MOD SED  04/03/2019  . IR PERCUTANEOUS ART THROMBECTOMY/INFUSION INTRACRANIAL INC DIAG ANGIO  04/01/2019  . RADIOLOGY WITH ANESTHESIA N/A 04/01/2019   Procedure: IR WITH ANESTHESIA;  Surgeon: Luanne Bras, MD;  Location: [facility];  Service: Radiology;  Laterality: N/A;   Social History   Socioeconomic History  . Marital status: Single    Spouse name: Not on file  . Number of children: Not on file  . Years of education: Not on file  . Highest education level: Not on file  Occupational History  . Not on file  Tobacco Use  . Smoking status: Not on file  Substance and Sexual Activity  . Alcohol use: Not on file  . Drug use: Not on file  . Sexual activity: Not on file  Other Topics Concern  . Not on file  Social History Narrative  . Not on file   Social Determinants of Health   Financial Resource Strain:   . Difficulty of Paying Living Expenses:   Food Insecurity:   . Worried About Charity fundraiser in the Last Year:   . Arboriculturist in the Last Year:   Transportation Needs:   . Film/video editor (Medical):    Marland Kitchen Lack of Transportation (Non-Medical):   Physical Activity:   . Days of Exercise per Week:   . Minutes of Exercise per Session:   Stress:   . Feeling of Stress :   Social Connections:   . Frequency of Communication with Friends and Family:   . Frequency of Social Gatherings with Friends and Family:   . Attends Religious Services:   . Active Member of Clubs or Organizations:   . Attends Archivist Meetings:   Marland Kitchen Marital Status:   Intimate Partner Violence:   . Fear of Current or Ex-Partner:   . Emotionally Abused:   Marland Kitchen Physically Abused:   . Sexually Abused:    family history is not on file.  Current Facility-Administered Medications:  .  0.9 %  sodium chloride infusion (Manually program via Guardrails IV Fluids), , Intravenous, Once, Aroor, Sushanth R, MD .  0.9 %  sodium chloride infusion (Manually program via Guardrails IV Fluids), , Intravenous, Once, Eshraghi, Shervin, MD .  0.9 %  sodium chloride infusion, , Intravenous, Continuous, Rinehuls, Early Chars, PA-C, Stopped at 04/04/19 1158 .  0.9 %  sodium chloride infusion, , Intravenous, Continuous, Eshraghi, Shervin, MD .  acetaminophen (TYLENOL) tablet 650 mg, 650 mg, Oral, Q4H PRN **OR**  acetaminophen (TYLENOL) 160 MG/5ML solution 650 mg, 650 mg, Per Tube, Q4H PRN **OR** acetaminophen (TYLENOL) suppository 650 mg, 650 mg, Rectal, Q4H PRN, Aroor, Lanice Schwab, MD, 650 mg at 04/02/19 8883 .  aspirin chewable tablet 81 mg, 81 mg, Oral, Daily **OR** aspirin chewable tablet 81 mg, 81 mg, Per Tube, Daily, Deveshwar, Sanjeev, MD, 81 mg at 04/04/19 1214 .  atorvastatin (LIPITOR) tablet 80 mg, 80 mg, Per Tube, q1800, Rosalin Hawking, MD, 80 mg at 04/03/19 1755 .  ceFAZolin (ANCEF) IVPB 2g/100 mL premix, 2 g, Intravenous, Q8H, Candee Furbish, MD .  chlorhexidine gluconate (MEDLINE KIT) (PERIDEX) 0.12 % solution 15 mL, 15 mL, Mouth Rinse, BID, Aroor, Lanice Schwab, MD, 15 mL at 04/04/19 0813 .  Chlorhexidine Gluconate Cloth 2 % PADS 6 each,  6 each, Topical, Daily, Rosalin Hawking, MD, 6 each at 04/03/19 1700 .  dexmedetomidine (PRECEDEX) 400 MCG/100ML (4 mcg/mL) infusion, 0.4-1.2 mcg/kg/hr, Intravenous, Titrated, Shellia Cleverly, MD, Last Rate: 10.85 mL/hr at 04/04/19 1200, 0.6 mcg/kg/hr at 04/04/19 1200 .  fentaNYL (SUBLIMAZE) injection 50 mcg, 50 mcg, Intravenous, Q15 min PRN, Corey Harold, NP .  fentaNYL (SUBLIMAZE) injection 50-200 mcg, 50-200 mcg, Intravenous, Q30 min PRN, Corey Harold, NP, 100 mcg at 04/02/19 0108 .  fentaNYL 2510mg in NS 2539m(1021mml) infusion-PREMIX, 0-200 mcg/hr, Intravenous, Continuous, Ogan, Okoronkwo U, MD, Stopped at 04/04/19 [phone] furosemide (LASIX) injection 20 mg, 20 mg, Intravenous, Once, Rinehuls, David L, PA-C .  MEDLINE mouth rinse, 15 mL, Mouth Rinse, 10 times per day, Aroor, SusLanice SchwabD, 15 mL at 04/04/19 1115 .  ondansetron (ZOFRAN) injection 4 mg, 4 mg, Intravenous, Q6H PRN, Deveshwar, Sanjeev, MD .  phenylephrine CONCENTRATED 100m69m sodium chloride 0.9% 250mL13m4mg/m31minfusion, 0-300 mcg/min, Intravenous, Titrated, Xu, JiRosalin HawkingStopped at 04/04/19 1019 .  senna-docusate (Senokot-S) tablet 1 tablet, 1 tablet, Oral, QHS PRN, Aroor, SushanKarena Addison .  sodium chloride flush (NS) 0.9 % injection 3 mL, 3 mL, Intravenous, Once, PickerDavonna Belling  vitamin B-12 (CYANOCOBALAMIN) tablet 1,000 mcg, 1,000 mcg, Per Tube, Daily, 1,000 mcg at 04/04/19 0953 **OR** vitamin B-12 (CYANOCOBALAMIN) tablet 1,000 mcg, 1,000 mcg, Oral, Daily, Xu, JiRosalin Hawkingot on File   Objective:     BP 116/60   Pulse 98   Temp 98.5 F (36.9 C) (Axillary)   Resp (!) 22   Ht 5' 5" (1.651 m)   Wt 72.3 kg   SpO2 100%   BMI 26.52 kg/m   On a ventilator and unresponsive  OG tube in place and it was lavaged and there is no evidence of blood in the stomach or upper GI tract.  Heart regular  Abdomen nontender  Laboratory No components found for: D1    Assessment:     No sign of GI bleeding       Plan:     Follow clinically no GI intervention planned at this time. Lab Results  Component Value Date   HGB 6.5 (LL) 04/04/2019   HGB 6.7 (LL) 04/04/2019   HGB 8.4 (L) 04/03/2019   HCT 19.9 (L) 04/04/2019   HCT 20.4 (L) 04/04/2019   HCT 25.1 (L) 04/03/2019   ALKPHOS 112 04/01/2019   AST 63 (H) 04/01/2019   ALT 40 04/01/2019

## 2019-04-04 NOTE — Progress Notes (Signed)
NAME:  Lauren Reyes, MRN:  [id number], DOB:  [birth year], LOS: 3 ADMISSION DATE:  04/01/2019, CONSULTATION DATE: 04/02/2019 REFERRING MD: Corliss Skains, CHIEF COMPLAINT: CVA  Brief History     28 year old female with suspected IV drug use adm with sudden onset right-sided weakness and left MCA occlusion status post revascularization by neuro IR and left ICA stent  Past Medical History  No available previous medical history  Significant Hospital Events   NA Consults:  PCCM, interventional radiology  Procedures:  3/24 S/P Lt common carotid arteriogram followed by complete revascularization of occluded M3 region of inferior division of Lt MCA , Placement of a 4.5 mm x 79mm pipeline flow diverter at site of focal dissection associated with a small pseudoaneurysm of distal cervical LT ICA..  - ET 3/24 - L Subclavian CVL 3/25   Significant Diagnostic Tests:  CT scan of the brain / neck  along with perfusion scan 3/24 >> Positive for a Left MCA proximal M3 brandch occlusion  Positive also for multiple bilateral upper lung nodules, some with early cavitation. CT pelvis 3/25 : 1. Large diffuse hematoma involving the anterior right groin and upper right thigh. Hematoma does not extend into the intra-abdominal retroperitoneal structures. CT chest   3/25  Multifocal nodularity in both lungs, largest areas in the superior segment of the right lower lobe measuring 2 x 1.9 cm. Similar smaller nodules showing more central soft tissue attenuation and ill-defined ground-glass margins scattered about the chest. Differential diagnosis includes multifocal pneumonia and would include viral or atypical processes, even COVID-19 infection given the peripheral nature of many of these nodules. Septic emboli and metastatic disease are also considered  Venous dopplers 3/25:  POS  R common fem acute DVT Echo 3/25: 1. Normal LV function; moderate AI; mild MR and TR.  2. Left ventricular ejection  fraction, by estimation, is 55 to 60%. The  left ventricle has normal function. The left ventricle has no regional  wall motion abnormalities. Left ventricular diastolic parameters were  normal.  3. Right ventricular systolic function is normal. The right ventricular  size is normal. There is mildly elevated pulmonary artery systolic  pressure.  4. The mitral valve is normal in structure. Mild mitral valve  regurgitation. No evidence of mitral stenosis.  5. The aortic valve is tricuspid. Aortic valve regurgitation is moderate.  Mild aortic valve sclerosis is present, with no evidence of aortic valve  stenosis.  6. The inferior vena cava is normal in size with greater than 50%  respiratory variability, suggesting right atrial pressure of 3 mmHg   Micro Data:  RVP 3/25 neg covid/ flu MRSA pcr  3/25 > neg  Blood culture x 2 3/25 >> ET 3/25  MSSA  Antimicrobials:  3/25 Vancomycin >> 3/25 Zosyn>>    Interim history/subjective:  Mild pressor requirements persist. HgB drop this AM, 2 units ordered. On minimal vent support. Not following commands.    Objective   Blood pressure (!) 106/55, pulse 99, temperature 97.9 F (36.6 C), temperature source Axillary, resp. rate (!) 22, height 5\' 5"  (1.651 m), weight 72.3 kg, SpO2 100 %. CVP:  [4 mmHg-12 mmHg] 4 mmHg  Vent Mode: CPAP;PSV FiO2 (%):  [30 %] 30 % Set Rate:  [16 bmp] 16 bmp Vt Set:  [450 mL] 450 mL PEEP:  [5 cmH20] 5 cmH20 Pressure Support:  [10 cmH20] 10 cmH20 Plateau Pressure:  [13 cmH20-20 cmH20] 13 cmH20   Intake/Output Summary (Last 24 hours) at 04/04/2019 1106 Last data filed  at 04/04/2019 1045 Gross per 24 hour  Intake 3267.69 ml  Output 2850 ml  Net 417.69 ml   Filed Weights   04/01/19 1900 04/01/19 2023  Weight: 72.3 kg 72.3 kg   GEN: young woman on vent HEENT: ETT in place, small thick secretions CV: RRR, ext warm PULM: crackles bases, otherwise clear GI: Soft, +BS EXT: trace anasarca, induration  around R groin, stable in size per RN Jen NEURO: Moves all 4 ext but weaker on R, not following commands PSYCH: agitates easily SKIN: no rashes   Resolved Hospital Problem list   NA  Assessment & Plan:  Splenic infarcts, renal infarcts, stroke, pulmonary infarcts, MSSA in sputum in suspected IVDU all c/w endocarditis, possible R-L shunting. - Switch to ancef, at least 6 weeks therapy - Needs TEE for shunt r/o and further eval of valves, aortic valve suspicious on TTE   R groin hematoma- I think this is cause of hemoglobin drop however primary has called GI, CT A/P reassuring - Per primary  Acute respiratory failure- related to stroke, pulmonary infarcts - After GI eval and TEE can do SBT  L MCA stroke s/p emergent revascularization-  complicated by ICA pseudoaneurysm requiring stent bypass - BP, imaging, AC, antiplatelets per primary  Shock- hemorrhagic vs. Septic, improved with blood and antibiotics  Best practice:  Diet: N.p.o. Pain/Anxiety/Delirium protocol (if indicated): Precedex/fentanyl, goal RASS -1 VAP protocol (if indicated): Yes DVT prophylaxis: per primary GI prophylaxis: Pepcid Glucose control: Monitor Mobility: Bedrest Code Status: Full Family Communication: per primary Disposition:  ICU    The patient is critically ill with multiple organ systems failure and requires high complexity decision making for assessment and support, frequent evaluation and titration of therapies, application of advanced monitoring technologies and extensive interpretation of multiple databases. Critical Care Time devoted to patient care services described in this note independent of APP/resident time (if applicable)  is 32 minutes.   Erskine Emery MD [facility] 04/04/2019 11:23 AM Personal pager: [phone] If unanswered, please page CCM On-call: [phone]

## 2019-04-05 ENCOUNTER — Inpatient Hospital Stay: Payer: Self-pay

## 2019-04-05 LAB — BASIC METABOLIC PANEL
Anion gap: 12 (ref 5–15)
BUN: <5 mg/dL — ABNORMAL LOW (ref 6–20)
CO2: 21 mmol/L — ABNORMAL LOW (ref 22–32)
Calcium: 7.9 mg/dL — ABNORMAL LOW (ref 8.9–10.3)
Chloride: 110 mmol/L (ref 98–111)
Creatinine, Ser: 0.52 mg/dL (ref 0.44–1.00)
GFR calc Af Amer: >60 mL/min (ref >60)
GFR calc non Af Amer: >60 mL/min (ref >60)
Glucose, Bld: 87 mg/dL (ref 70–99)
Potassium: 2.9 mmol/L — ABNORMAL LOW (ref 3.5–5.1)
Sodium: 143 mmol/L (ref 135–145)

## 2019-04-05 LAB — CBC WITH DIFFERENTIAL/PLATELET
Abs Immature Granulocytes: 1.14 10*3/uL — ABNORMAL HIGH (ref 0.00–0.07)
Basophils Absolute: 0.1 10*3/uL (ref 0.0–0.1)
Basophils Relative: 0 %
Eosinophils Absolute: 0.1 10*3/uL (ref 0.0–0.5)
Eosinophils Relative: 0 %
HCT: 27.6 % — ABNORMAL LOW (ref 36.0–46.0)
Hemoglobin: 9.3 g/dL — ABNORMAL LOW (ref 12.0–15.0)
Immature Granulocytes: 4 %
Lymphocytes Relative: 9 %
Lymphs Abs: 2.7 10*3/uL (ref 0.7–4.0)
MCH: 26.9 pg (ref 26.0–34.0)
MCHC: 33.7 g/dL (ref 30.0–36.0)
MCV: 79.8 fL — ABNORMAL LOW (ref 80.0–100.0)
Monocytes Absolute: 1.6 10*3/uL — ABNORMAL HIGH (ref 0.1–1.0)
Monocytes Relative: 5 %
Neutro Abs: 23.9 10*3/uL — ABNORMAL HIGH (ref 1.7–7.7)
Neutrophils Relative %: 82 %
Platelets: 370 10*3/uL (ref 150–400)
RBC: 3.46 MIL/uL — ABNORMAL LOW (ref 3.87–5.11)
RDW: 14.8 % (ref 11.5–15.5)
WBC: 29.5 10*3/uL — ABNORMAL HIGH (ref 4.0–10.5)
nRBC: 0.1 % (ref 0.0–0.2)

## 2019-04-05 LAB — TYPE AND SCREEN
ABO/RH(D): O POS
Antibody Screen: NEGATIVE
Unit division: 0
Unit division: 0

## 2019-04-05 LAB — BPAM RBC
Blood Product Expiration Date: 202104272359
Blood Product Expiration Date: 202104282359
ISSUE DATE / TIME: 202103270937
ISSUE DATE / TIME: 202103271146
Unit Type and Rh: 5100
Unit Type and Rh: 5100

## 2019-04-05 LAB — PROCALCITONIN: Procalcitonin: 0.65 ng/mL

## 2019-04-05 LAB — POTASSIUM: Potassium: 3.4 mmol/L — ABNORMAL LOW (ref 3.5–5.1)

## 2019-04-05 MED ORDER — TICAGRELOR 90 MG PO TABS
90.0000 mg | ORAL_TABLET | Freq: Two times a day (BID) | ORAL | Status: DC
  Administered 2019-04-05 – 2019-04-20 (×29): 90 mg via ORAL
  Filled 2019-04-05 (×29): qty 1

## 2019-04-05 MED ORDER — POTASSIUM CHLORIDE 10 MEQ/50ML IV SOLN
10.0000 meq | INTRAVENOUS | Status: AC
  Administered 2019-04-05 (×6): 10 meq via INTRAVENOUS
  Filled 2019-04-05 (×6): qty 50

## 2019-04-05 MED ORDER — MAGNESIUM SULFATE 2 GM/50ML IV SOLN
2.0000 g | Freq: Once | INTRAVENOUS | Status: AC
  Administered 2019-04-05: 2 g via INTRAVENOUS
  Filled 2019-04-05: qty 50

## 2019-04-05 MED ORDER — SODIUM CHLORIDE 0.9 % IV SOLN
INTRAVENOUS | Status: DC | PRN
  Administered 2019-04-05: 500 mL via INTRAVENOUS
  Administered 2019-04-15: 1000 mL via INTRAVENOUS

## 2019-04-05 MED ORDER — SODIUM CHLORIDE 0.9 % IV BOLUS
500.0000 mL | Freq: Once | INTRAVENOUS | Status: AC
  Administered 2019-04-05: 500 mL via INTRAVENOUS

## 2019-04-05 MED ORDER — POTASSIUM CHLORIDE 20 MEQ/15ML (10%) PO SOLN: 40.0000 meq | Freq: Once | ORAL | Status: DC

## 2019-04-05 NOTE — Progress Notes (Signed)
The patient is doing well today.  There have been no signs of GI bleeding.  Hemoglobin stable.  We will sign off.  Call us if needed.

## 2019-04-05 NOTE — Progress Notes (Signed)
Spoke with charge nurse and informed that PICC will be placed tomorrow.

## 2019-04-05 NOTE — Evaluation (Signed)
Clinical/Bedside Swallow Evaluation Patient Details  Name: Lauren Reyes MRN: [id number] Date of Birth: 1990/08/28  Today's Date: 04/05/2019 Time: SLP Start Time (ACUTE ONLY): 4098 SLP Stop Time (ACUTE ONLY): 0856 SLP Time Calculation (min) (ACUTE ONLY): 24 min  Past Medical History: History reviewed. No pertinent past medical history. Past Surgical History:  Past Surgical History:  Procedure Laterality Date  . IR CT HEAD LTD  04/01/2019  . IR INTRAVSC STENT CERV CAROTID W/O EMB-PROT MOD SED INC ANGIO  04/01/2019  . IR IVC FILTER PLMT / S&I /IMG GUID/MOD SED  04/03/2019  . IR PERCUTANEOUS ART THROMBECTOMY/INFUSION INTRACRANIAL INC DIAG ANGIO  04/01/2019  . RADIOLOGY WITH ANESTHESIA N/A 04/01/2019   Procedure: IR WITH ANESTHESIA;  Surgeon: Julieanne Cotton, MD;  Location: [facility] OR;  Service: Radiology;  Laterality: N/A;   HPI:  Pt is a 29 yo female with suspected IVDU admitted with sudden onset R sided weakness and aphasia, now s/p revascularization by IR of L MCA. MRI showed numerous smaller acute infarcts scattered throughout the brain bilaterally. ETT 3/24-3/27.   Assessment / Plan / Recommendation Clinical Impression  Pt has significantly reduced ROM appreciated bilaterally during oral motor exam, although with weakness noted primarily on the R side with facial droop (CN VII). When prompted to move any of her articulators to command she has minimal movement, partially parting her lips but keeping her teeth clenched and her tongue retracted. She can achieve mildly increased ROM during spontaneous observation and PO trials. She has what appears to be a combination of oral holding and delayed oral transit, and she intermittently has trouble sucking liquid via straw. Pt has a sharp inhalation post-swallow after most sips of water and when instructed to drink three ounces of water consecutively she cannot do so without a strong cough response. Given significant oral deficits and s/s concerning  for aspiration, recommend proceeding with MBS before starting PO diet. This can likely be completed on the next date at the earliest - pending completion she can have a few pieces of ice after oral care if given one at a time and with full supervision.  SLP Visit Diagnosis: Dysphagia, oropharyngeal phase (R13.12)    Aspiration Risk  Moderate aspiration risk    Diet Recommendation NPO;Ice chips PRN after oral care   Medication Administration: Via alternative means    Other  Recommendations Oral Care Recommendations: Oral care QID Other Recommendations: Have oral suction available   Follow up Recommendations Inpatient Rehab      Frequency and Duration            Prognosis Prognosis for Safe Diet Advancement: Good Barriers to Reach Goals: Cognitive deficits;Severity of deficits      Swallow Study   General HPI: Pt is a 29 yo female with suspected IVDU admitted with sudden onset R sided weakness and aphasia, now s/p revascularization by IR of L MCA. MRI showed numerous smaller acute infarcts scattered throughout the brain bilaterally. ETT 3/24-3/27. Type of Study: Bedside Swallow Evaluation Previous Swallow Assessment: none in chart Diet Prior to this Study: Regular;Thin liquids Temperature Spikes Noted: Yes(100.5) Respiratory Status: Nasal cannula History of Recent Intubation: Yes Length of Intubations (days): 2 days Date extubated: 04/04/19 Behavior/Cognition: Alert;Cooperative Oral Cavity Assessment: (difficult to assess - see clinical impressions) Oral Care Completed by SLP: No Oral Cavity - Dentition: Adequate natural dentition(in anterior of mouth - difficult to see posterior) Vision: Functional for self-feeding Self-Feeding Abilities: Needs assist Patient Positioning: Upright in bed Baseline Vocal Quality: Normal Volitional  Swallow: Able to elicit    Oral/Motor/Sensory Function Overall Oral Motor/Sensory Function: Severe impairment Facial ROM: Reduced left;Reduced  right(reduced R > L) Facial Symmetry: Abnormal symmetry right Facial Strength: Reduced right;Reduced left Lingual ROM: Reduced right;Reduced left Lingual Symmetry: Within Functional Limits Lingual Strength: Reduced Velum: (UTA) Mandible: Impaired   Ice Chips Ice chips: Impaired Presentation: Spoon Oral Phase Functional Implications: Prolonged oral transit   Thin Liquid Thin Liquid: Impaired Presentation: Spoon;Straw Oral Phase Functional Implications: Prolonged oral transit Pharyngeal  Phase Impairments: Cough - Immediate    Nectar Thick Nectar Thick Liquid: Not tested   Honey Thick Honey Thick Liquid: Not tested   Puree Puree: Impaired Presentation: Spoon;Self Fed Oral Phase Functional Implications: Prolonged oral transit   Solid     Solid: Not tested       Osie Bond., M.A. [place] Pager [phone] Office [phone]  04/05/2019,9:16 AM

## 2019-04-05 NOTE — Progress Notes (Signed)
Notified patient's sister, Elmarie Shiley, of transfer to [facility]. Patient belonging bag present upon transport.

## 2019-04-05 NOTE — Progress Notes (Signed)
Spoke with Victorino Dike, RN c/o PICC order. She is to clarify with Dr. Katrinka Blazing. Possible IVDU patient, who has antibiotics ordered. CVC in place. No attempts made for PIV access. Considering past medical, perhaps placement of a PIV would suffice at this time. If prolonged abx tx is needed, then a PICC could be placed at that time.

## 2019-04-05 NOTE — Progress Notes (Signed)
NAME:  Lauren Reyes, MRN:  [id number], DOB:  1990/10/01, LOS: 4 ADMISSION DATE:  04/01/2019, CONSULTATION DATE: 04/02/2019 REFERRING MD: Estanislado Pandy, CHIEF COMPLAINT: CVA  Brief History     29 year old female with suspected IV drug use adm with sudden onset right-sided weakness and left MCA occlusion status post revascularization by neuro IR and left ICA stent  Past Medical History  No available previous medical history  Significant Hospital Events   NA Consults:  PCCM, interventional radiology  Procedures:  3/24 S/P Lt common carotid arteriogram followed by complete revascularization of occluded M3 region of inferior division of Lt MCA , Placement of a 4.5 mm x 24mm pipeline flow diverter at site of focal dissection associated with a small pseudoaneurysm of distal cervical LT ICA..  - ET 3/24 - L Subclavian CVL 3/25   Significant Diagnostic Tests:  CT scan of the brain / neck  along with perfusion scan 3/24 >> Positive for a Left MCA proximal M3 brandch occlusion  Positive also for multiple bilateral upper lung nodules, some with early cavitation. CT pelvis 3/25 : 1. Large diffuse hematoma involving the anterior right groin and upper right thigh. Hematoma does not extend into the intra-abdominal retroperitoneal structures. CT chest   3/25  Multifocal nodularity in both lungs, largest areas in the superior segment of the right lower lobe measuring 2 x 1.9 cm. Similar smaller nodules showing more central soft tissue attenuation and ill-defined ground-glass margins scattered about the chest. Differential diagnosis includes multifocal pneumonia and would include viral or atypical processes, even COVID-19 infection given the peripheral nature of many of these nodules. Septic emboli and metastatic disease are also considered  Venous dopplers 3/25:  POS  R common fem acute DVT Echo 3/25: 1. Normal LV function; moderate AI; mild MR and TR.  2. Left ventricular ejection  fraction, by estimation, is 55 to 60%. The  left ventricle has normal function. The left ventricle has no regional  wall motion abnormalities. Left ventricular diastolic parameters were  normal.  3. Right ventricular systolic function is normal. The right ventricular  size is normal. There is mildly elevated pulmonary artery systolic  pressure.  4. The mitral valve is normal in structure. Mild mitral valve  regurgitation. No evidence of mitral stenosis.  5. The aortic valve is tricuspid. Aortic valve regurgitation is moderate.  Mild aortic valve sclerosis is present, with no evidence of aortic valve  stenosis.  6. The inferior vena cava is normal in size with greater than 50%  respiratory variability, suggesting right atrial pressure of 3 mmHg   Micro Data:  RVP 3/25 neg covid/ flu MRSA pcr  3/25 > neg  Blood culture x 2 3/25 >> ET 3/25  MSSA  Antimicrobials:  3/25 Vancomycin >>3/27 3/25 Zosyn>>3/27 Cefazolin 3/27 >>  Interim history/subjective:  Off pressors. Extubated. Drinking water. Complains of thirst. Aphasia I think mostly related to right facial muscle weakness.  Objective   Blood pressure 140/78, pulse (!) 127, temperature 99 F (37.2 C), temperature source Oral, resp. rate (!) 34, height 5\' 5"  (1.651 m), weight 72.3 kg, SpO2 99 %. CVP:  [5 mmHg-7 mmHg] 7 mmHg  Vent Mode: CPAP;PSV FiO2 (%):  [30 %] 30 % PEEP:  [5 cmH20] 5 cmH20 Pressure Support:  [10 cmH20] 10 cmH20   Intake/Output Summary (Last 24 hours) at 04/05/2019 0716 Last data filed at 04/05/2019 0700 Gross per 24 hour  Intake 1773.48 ml  Output 2250 ml  Net -476.52 ml   [employer]  04/01/19 1900 04/01/19 2023  Weight: 72.3 kg 72.3 kg   GEN: young woman in no acute distress HEENT: MM dry, trachea midline CV: RRR, ext warm, +SEM PULM: scattered rhonci, otherwise clear GI: Soft, +BS EXT: Induration around R groin, stable, mild global anasarca NEURO: R sided weakness and facial droop,  responding to questions appropriately PSYCH: RASS 0 SKIN: no rashes   Resolved Hospital Problem list   Respiratory failure Shock  Assessment & Plan:  Splenic infarcts, renal infarcts, stroke, pulmonary infarcts, MSSA in sputum in suspected IVDU all c/w endocarditis, possible R-L shunting. - Ancef at least 6 weeks therapy - PICC consult and remove central line - Needs TEE for shunt r/o and further eval of valves, aortic valve suspicious on TTE  R groin hematoma - H/H stable after 2 units yesterday  Hypokalemia- 60 mEQ IV, 40 mEQ PO, 2 mg mag, recheck PM level  Dysphagia- I am hopeful she can take diet, will ask SLP to weigh back in  L MCA stroke s/p emergent revascularization-  complicated by ICA pseudoaneurysm requiring stent bypass - BP, imaging, AC, antiplatelets per primary - PT/OT consults  Should be fine for floor.  Will sign off, call if we can be of further assistance.  Myrla Halsted MD [facility] 04/05/2019 7:16 AM Personal pager: [phone] If unanswered, please page CCM On-call: [phone]

## 2019-04-05 NOTE — Progress Notes (Addendum)
STROKE TEAM PROGRESS NOTE   INTERVAL HISTORY Extubated.  Awake, alert.  Chemistry normal today.  No CBC today.  Bedside swallow study seems to be Ok.  Vitals:   04/05/19 0100 04/05/19 0200 04/05/19 0300 04/05/19 0400  BP: (!) 134/56 (!) 147/66 95/74 124/75  Pulse: 95 (!) 105 (!) 112 (!) 110  Resp: (!) 28 (!) 31 (!) 26 15  Temp:    99 F (37.2 C)  TempSrc:    Oral  SpO2: 100% 100% 100% 100%  Weight:      Height:        CBC:  Recent Labs  Lab 04/01/19 1943 04/01/19 2003 04/02/19 0013 04/02/19 0032 04/04/19 0405 04/04/19 0405 04/04/19 0602 04/04/19 1543  WBC 19.5*   < > 21.4*   < > 29.5*  --   --  27.8*  NEUTROABS 17.0*  --  18.4*  --   --   --   --   --   HGB 11.9*   < > 11.2*   < > 6.7*   < > 6.5* 8.7*  HCT 35.3*   < > 32.5*   < > 20.4*   < > 19.9* 26.2*  MCV 75.4*   < > 75.6*   < > 78.2*  --   --  79.9*  PLT 285   < > 254   < > 349  --   --  326   < > = values in this interval not displayed.    Basic Metabolic Panel:  Recent Labs  Lab 04/02/19 0013 04/02/19 0159 04/03/19 0502 04/03/19 0502 04/04/19 0405 04/04/19 1543  NA   < >  --  141   < > 142 144  K   < >  --  4.1   < > 3.3* 3.6  CL   < >  --  112*   < > 110 112*  CO2   < >  --  21*   < > 21* 21*  GLUCOSE   < >  --  104*   < > 101* 101*  BUN   < >  --  10   < > 7 7  CREATININE   < >  --  0.60   < > 0.61 0.59  CALCIUM   < >  --  7.7*   < > 7.7* 7.8*  MG  --  1.9 2.0  --   --   --   PHOS  --   --  3.3  --   --   --    < > = values in this interval not displayed.   Lipid Panel:     Component Value Date/Time   CHOL 163 04/02/2019 0012   TRIG 258 (H) 04/02/2019 0012   HDL <10 (L) 04/02/2019 0012   CHOLHDL NOT CALCULATED 04/02/2019 0012   VLDL 52 (H) 04/02/2019 0012   LDLCALC NOT CALCULATED 04/02/2019 0012   HgbA1c:  Lab Results  Component Value Date   HGBA1C 5.9 (H) 04/02/2019   Urine Drug Screen:     Component Value Date/Time   LABOPIA NONE DETECTED 04/01/2019 2349   COCAINSCRNUR NONE  DETECTED 04/01/2019 2349   LABBENZ NONE DETECTED 04/01/2019 2349   AMPHETMU POSITIVE (A) 04/01/2019 2349   THCU POSITIVE (A) 04/01/2019 2349   LABBARB NONE DETECTED 04/01/2019 2349    Alcohol Level No results found for: ETH  IMAGING  CT CHEST WO CONTRAST 04/02/2019 IMPRESSION:  1. Multifocal nodularity in both lungs,  largest areas in the superior segment of the right lower lobe measuring 2 x 1.9 cm. Similar smaller nodules showing more central soft tissue attenuation and ill-defined ground-glass margins scattered about the chest. Differential diagnosis includes multifocal pneumonia and would include viral or atypical processes, even COVID-19 infection given the peripheral nature of many of these nodules. Septic emboli and metastatic disease are also considered, close follow-up is suggested.  2. Mild cardiomegaly.  3. Heterogeneous appearance of the nephrogram bilaterally in the setting of recent angiography and thrombectomy. Continued correlation with renal function is suggested.  4. Endotracheal tube terminates in the mid trachea. Gastric tube in the stomach.  5. Triangular marbled fatty and soft tissue density in the anterior mediastinum compatible with residual thymic tissue.    MR BRAIN W WO CONTRAST MR ANGIO HEAD WO CONTRAST 04/02/2019 IMPRESSION:  1. Intracranial MRA is negative for vessel occlusion. The left ICA stent is patent. There is mild to moderate irregularity of the left MCA M1 with some stenosis, and mild similar irregularity of the distal ACAs.  2. Small 2-3 cm confluent infarct at the Left Insula with Heidelberg Classification 1b petechial hemorrhage.  3. But numerous smaller acute infarcts scattered throughout the brain. Deep gray nuclei and posterior fossa relatively spared. Several other areas of similar petechial hemorrhage. This constellation remains suspicious for septic emboli.  4. Suggestion also of trace subarachnoid hemorrhage. No intracranial mass effect. No  intraventricular hemorrhage or ventriculomegaly.   DG Chest Port 1 View 04/03/2019 IMPRESSION:  1. Endotracheal tube, NG tube, left subclavian line stable position.  2.  Progressive bilateral pulmonary infiltrates/edema.   DG CHEST PORT 1 VIEW 04/02/2019 IMPRESSION:  Interval placement of left central line with the tip in the upper right atrium. No pneumothorax. Left base atelectasis.   DG CHEST PORT 1 VIEW 04/04/2019 IMPRESSION: Congestive heart failure. Mild patchy opacity of right mid lung may be due to asymmetric pulmonary edema.   CT Abdomen and Pelvis W Contrast 04/04/19 IMPRESSION: 1. Small amount of hyperdense material around the right common femoral vein and arteries and within the subcutaneous fat likely reflecting a small amount of hemorrhagic fluid likely from recent percutaneous interventions. No retroperitoneal hematoma is present. 2. Wedge-shaped areas of hypodensity within the spleen, most concerning for splenic infarcts. Somewhat striated areas of hypoenhancement throughout bilateral kidneys likely reflecting small areas of infarction given the splenic abnormality.   VAS US GROIN PSEUDOANEURYSM 04/02/2019 Summary:  No evidence of pseudoaneurysm, or AVF.   Final    ECHOCARDIOGRAM COMPLETE 04/02/2019 IMPRESSIONS   1. Normal LV function; moderate AI; mild MR and TR.   2. Left ventricular ejection fraction, by estimation, is 55 to 60%. The left ventricle has normal function. The left ventricle has no regional wall motion abnormalities. Left ventricular diastolic parameters were normal.   3. Right ventricular systolic function is normal. The right ventricular size is normal. There is mildly elevated pulmonary artery systolic pressure.   4. The mitral valve is normal in structure. Mild mitral valve regurgitation. No evidence of mitral stenosis.   5. The aortic valve is tricuspid. Aortic valve regurgitation is moderate. Mild aortic valve sclerosis is present, with no  evidence of aortic valve stenosis.   6. The inferior vena cava is normal in size with greater than 50% respiratory variability, suggesting right atrial pressure of 3 mmHg.    VAS US LOWER EXTREMITY VENOUS (DVT) 04/02/2019 Summary:  RIGHT: - Findings consistent with acute deep vein thrombosis involving the right common femoral vein. -  No cystic structure found in the popliteal fossa.   LEFT: - There is no evidence of deep vein thrombosis in the lower extremity.  - No cystic structure found in the popliteal fossa Final    Cerebral Angio 04/01/2019 S/P Lt common carotid arteriogram followed by complete revascularization of occluded M3 region of inferior division of Lt MCA  With x 1 pass with solitairex 84mm x 20 mm retriever and penumbra aspiration achieving a TICI 3 revascularization. Placement of a 4.5 mm x 75mm pipeline flow diverter at site of focal dissection associated with a small pseudoaneurysm of distal cervical LT ICA.Marland Kitchen   PHYSICAL EXAM  Temp:  [97.3 F (36.3 C)-100.5 F (38.1 C)] 99 F (37.2 C) (03/28 0400) Pulse Rate:  [78-123] 110 (03/28 0400) Resp:  [13-35] 15 (03/28 0400) BP: (95-147)/(51-87) 124/75 (03/28 0400) SpO2:  [97 %-100 %] 100 % (03/28 0400) FiO2 (%):  [30 %] 30 % (03/27 1216)   Awake, alert.  Makes good eye contact.  Follows all commands.    She cannot open mouth wide or stick out her tongue. She does move to command all 4 extremities, but generally weak. Right facial droop.  ASSESSMENT/PLAN Lauren Reyes is a 29 y.o. female with unknown medical history, possible heroin abuse, presenting with aphasia and R sided weakness. tPA not given d/t multiple BUL lung nodules w/ concern for septic embolic. Found to have L M3 occlusion and sent to IR for mechanical thrombectomy.  Stroke:  left MCA infarct s/p IR L M3 occlusion w/ TICI3 revascularization, stent to L cervical ICA over focal dissection small pseudoaneurysm. Etiology most likely due to  endocarditis from IVDU  Code Stroke CT head - mild hypodensity L insula and possible middle frontal gyrus. ASPECTS 8    CTA head & neck - L MCA M3 branch occlusion. Multiple B upper lung nodules, ? Septic emboli.   CT perfusion - 35mL core infarct w/ 57ml L MCA penumbra  Cerebral angio - L M3 occlusion s/p TICI3 revascularization. Pipeline sent placed at site of focal dissection associated w/ small pseudoaneurysm distal cervical L ICA.   CT head - 3/24 repeat. L MCA contrast staining. Minimal L frontal lobe white matter infarct.   MRI - 04/02/19 - Small 2-3 cm confluent infarct at the Left Insula with Heidelberg Classification 1b petechial hemorrhage. But numerous smaller acute infarcts scattered throughout the brain.  Several other areas of similar petechial hemorrhage. This constellation remains suspicious for septic emboli. Suggestion also of trace subarachnoid hemorrhage.  MRA - negative for vessel occlusion. The left ICA stent is patent.  CXR - 04/04/19 - Congestive heart failure. Mild patchy opacity of right mid lung may be due to asymmetric pulmonary edema  LE Doppler - Right common femoral vein acute DVT  CT Abdomen and Pelvis - 04/04/19 - No retroperitoneal hematoma; however, concerning for possible renal and splenic infarcts.  2D Echo EF 55-60%, no vegetation seen  Will need TEE once stabilized  LDL NOT CALCULATED d/t TG 382. Check direct LDL - 42.9  HgbA1c 5.9  SARS - negative  HIV - non reactive  SCDs for VTE prophylaxis  unknown meds prior to admission, now on aspirin 81 mg daily and Brilinta (ticagrelor) 90 mg bid for stent. (Suggestion also of trace subarachnoid hemorrhage on MRI)  P2Y12 - 115 - for antiplatelet dose adjustment by NIR  Therapy recommendations:  pending   Disposition:  pending   Acute Respiratory Failure  Intubated for IR  left intubated for  airway protection  Sedated  Wean to extubation as able  CCM on board  Lung Nodules, possible  septic emboli ? infective endocarditis Fever   CT chest B lung multifocal nodularity, largest superior RLL. Dif includes: PNA, COVID, septic emboli and mets. Mild cardiomegaly. Heterogenous B nephrogram. ET in mid trachea. GT in stomach. Tissue in mediastinum c/w residual thymic tissue.  WBC 19.5->21.4->26.4->25.6->29.5  Temperature - 98.1->97.9  No nuchal rigidity - less likely meningitis - not LP candidate given on brilinta - on cefepime and vanco for coverage  Zosyn 3/25>>3/25->off  Vanc 3/25>>off  Cefepime 3/25>>  Blood Cx no growth day 1  Sputum Cx - MODERATE STAPHYLOCOCCUS AUREUS SUSCEPTIBILITIES TO FOLLOW   DVT  LE venous doppler - right common femoral vein DVT  Not candidate for anticoagulation due to large right groin hematoma and possible endocarditis  IR consulted for IVC filter - spoke with IR PA - IVC filter planned for today (Saturday)  Substance abuse, Possible IVDU Sinus Tachycardia, possible withdrawal   UDS:  THC POSITIVE, Amphetamines POSITIVE  Suspicious for heroin use.   Multiple needle marks b/l UEs, L>R.   Fluid bolus  Fentanyl gtt  CIWA protocol  R groin hematoma  Sheath removed post IR w/ angioseal placed  New R groin hematoma on rounds  CT pelvis large diffuse hematoma anterior R groin and upper thigh NOT extending into intra-abd retroperitoneal structures. Limited eval for pseudoaneurysm  LE pseudoaneurysm [place] - No evidence of pseudoaneurysm, or AVF.   Hgb 11.2->12.2->9.3->8.6  Not candidate for anticoagulation at this time  Hypotension . BP goal per IR x 24h 120-140  . On phenylephrine . Long-term BP goal normotensive  Possible Hyperlipidemia  LDL NOT CALCULATED d/t TG 974.   Direct LDL 42.9, Goal LDL < 70  Home meds unknown  Now on Lipitor 80  Dysphagia Secondary to stroke NPO Speech on board  Elevated blood hcG  I-stat hcG 7.4  HCG beta chain - neg  Repeat HcG beta chain - <1  Urine pregnancy test -  negative  B12 deficiency   B12 = 175  B12 supplement  Other Stroke Risk Factors  Substance abuse  Other Active Problems  Hyponatremia 127->130->132 - continue IVF->141   Pt's mother lives in [place] - IR PA spoke with her to obtain consent for IVC filter. Pt also has a sister, Lauren Reyes, who would like to be called with an update on pt's condition. [phone]   Hospital day # 4   Impression:   MRI Brain shows multiple bilateral frontal, parietal, occipital, and left cerebellar infarcts.  The left parietal infarct is the largest.  Several of these lesions have some hemorrhagic conversion. She is very well clinically at this point.  I held her Brilinta due to major drop in her Hemoglobin.  Source of GI bleed not clear at this time, but GI elected not to do EGD for now.  Retroperitoneal hematoma has been ruled out.   I have ordered a CBC to be done this am.  If Hemoglobin is stable, I will consider re-starting the Brilinta as we are risking ICA stent thrombosis.  Continue ASA.  She has right leg DVT and IVC filter was placed.    Although TTE was normal, awaiting TEE to assess for valvular vegetations from IVDA or intracardiac thrombus.  She seems to have had cardioembolic strokes either due to septic embolism or cardiac thrombus secondary to amphetamine induced arrhythmia.  No atrial fibrillation/flutter has been identified here yet.  She is positive for staphylococcus aureus in the sputum and on is on Cefazolin.  Blood cultures are negative so far.    Stable to transfer to floor.    Rogue Jury, MS, MD   Addendum: Hemoglobin 9.3 WBC 29.5    I will re-start the Brilinta.    Rogue Jury, MS, MD    This patient is critically ill due to left MCA stroke, septic emboli, IVDU, DVT, hypotension, fever and at significant risk of neurological worsening, death form recurrent stroke, hemorrhagic conversion, sepsis, septic shock, heart failure, seizure. This  patient's care requires constant monitoring of vital signs, hemodynamics, respiratory and cardiac monitoring, review of multiple databases, neurological assessment, discussion with family, other specialists and medical decision making of high complexity. I spent 45 minutes of neurocritical care time in the care of this patient.   To contact Stroke Continuity provider, please refer to [email or web address] After hours, contact General Neurology

## 2019-04-05 NOTE — Progress Notes (Signed)
Nurse received report from [facility]. Patient arrived to unit alert and oriented x2. Patient stable. Bed left in lowest postion, alarm on, call bell within reach. Telemetry placed and verified. Will continue to monitor. Melony Overly RN

## 2019-04-06 ENCOUNTER — Inpatient Hospital Stay (HOSPITAL_COMMUNITY): Payer: Medicaid Other

## 2019-04-06 DIAGNOSIS — I69391 Dysphagia following cerebral infarction: Secondary | ICD-10-CM

## 2019-04-06 DIAGNOSIS — G8191 Hemiplegia, unspecified affecting right dominant side: Secondary | ICD-10-CM

## 2019-04-06 LAB — CBC WITH DIFFERENTIAL/PLATELET
Abs Immature Granulocytes: 0.82 10*3/uL — ABNORMAL HIGH (ref 0.00–0.07)
Basophils Absolute: 0.1 10*3/uL (ref 0.0–0.1)
Basophils Relative: 1 %
Eosinophils Absolute: 0.2 10*3/uL (ref 0.0–0.5)
Eosinophils Relative: 1 %
HCT: 25.4 % — ABNORMAL LOW (ref 36.0–46.0)
Hemoglobin: 8.6 g/dL — ABNORMAL LOW (ref 12.0–15.0)
Immature Granulocytes: 4 %
Lymphocytes Relative: 13 %
Lymphs Abs: 2.8 10*3/uL (ref 0.7–4.0)
MCH: 27.1 pg (ref 26.0–34.0)
MCHC: 33.9 g/dL (ref 30.0–36.0)
MCV: 80.1 fL (ref 80.0–100.0)
Monocytes Absolute: 1.2 10*3/uL — ABNORMAL HIGH (ref 0.1–1.0)
Monocytes Relative: 6 %
Neutro Abs: 17.1 10*3/uL — ABNORMAL HIGH (ref 1.7–7.7)
Neutrophils Relative %: 75 %
Platelets: 403 10*3/uL — ABNORMAL HIGH (ref 150–400)
RBC: 3.17 MIL/uL — ABNORMAL LOW (ref 3.87–5.11)
RDW: 15 % (ref 11.5–15.5)
WBC: 22.2 10*3/uL — ABNORMAL HIGH (ref 4.0–10.5)
nRBC: 0.1 % (ref 0.0–0.2)

## 2019-04-06 MED ORDER — STROKE: EARLY STAGES OF RECOVERY BOOK
Status: AC
  Filled 2019-04-06: qty 1

## 2019-04-06 MED ORDER — ENSURE ENLIVE PO LIQD
237.0000 mL | Freq: Three times a day (TID) | ORAL | Status: DC
  Administered 2019-04-06 – 2019-04-12 (×11): 237 mL via ORAL
  Filled 2019-04-06: qty 237

## 2019-04-06 MED ORDER — SODIUM CHLORIDE 0.9% FLUSH
10.0000 mL | Freq: Two times a day (BID) | INTRAVENOUS | Status: DC
  Administered 2019-04-06 – 2019-04-18 (×23): 10 mL
  Administered 2019-04-18 – 2019-04-19 (×2): 40 mL
  Administered 2019-04-19 – 2019-04-21 (×4): 10 mL
  Administered 2019-04-21: 40 mL
  Administered 2019-04-22 – 2019-04-27 (×12): 10 mL

## 2019-04-06 MED ORDER — SODIUM CHLORIDE 0.9% FLUSH
10.0000 mL | INTRAVENOUS | Status: DC | PRN
  Administered 2019-04-20: 10 mL

## 2019-04-06 MED ORDER — SODIUM CHLORIDE 0.9 % IV SOLN: INTRAVENOUS | Status: DC

## 2019-04-06 NOTE — Progress Notes (Signed)
Occupational Therapy Treatment Patient Details Name: Lauren Reyes MRN: [id number] DOB: 1990/10/17 Today's Date: 04/06/2019    History of present illness 29 year old female with suspected IV drug abuse who was being interviewed by police tonight and developed right-sided facial weakness, aphasia, and R weakness.  She was brought by EMS to the emergency room work-up revealed occluded left MCA. Revascularization performed by IR. CT head and neck demonstrate multiple small cavitary abscesses in the upper lobes bilaterally. IVC filter placement for RLE DVT on 3/26.   OT comments  Cotx with PT to maximize pt safety and functional performance. Pt making progress in therapy, demonstrating improved balance and independence with self-care and functional transfer tasks. Pt tolerated sitting EOB ~5+ min with variable supervision to min guard to ensure balance and safety. Pt able to ambulate short distance to [facility] with RW and min assist x 2. Pt completed toileting, total body bathing, and grooming tasks while seated on BSC incorporating use of RUE into tasks. Pt required step by step instructions to complete self-care tasks with cues for safety throughout. HR into 120s during activity with no signs of distress. OT will continue to follow acutely. Continue to recommend CIR for additional rehab prior to discharge home.    Follow Up Recommendations  CIR;Supervision/Assistance - 24 hour    Equipment Recommendations  None recommended by OT    Recommendations for Other Services      Precautions / Restrictions Precautions Precautions: Fall;Other (comment) Precaution Comments: impulsive Restrictions Weight Bearing Restrictions: No       Mobility Bed Mobility Overal bed mobility: Needs Assistance Bed Mobility: Supine to Sit     Supine to sit: Min guard     General bed mobility comments: Cues for safety  Transfers Overall transfer level: Needs assistance Equipment used: Rolling walker (2  wheeled) Transfers: Sit to/from Stand Sit to Stand: Min assist;+2 physical assistance         General transfer comment: Cues for hand placement. Pt able to transfer to/from [facility] and to bedside chair.     Balance Overall balance assessment: Needs assistance Sitting-balance support: Feet supported Sitting balance-Leahy Scale: Fair       Standing balance-Leahy Scale: Poor Standing balance comment: external assistance to maintain standing.                           ADL either performed or assessed with clinical judgement   ADL Overall ADL's : Needs assistance/impaired     Grooming: Wash/dry hands;Wash/dry face;Set up;Supervision/safety;Sitting   Upper Body Bathing: Set up;Supervision/ safety;Sitting   Lower Body Bathing: Moderate assistance;Sit to/from stand Lower Body Bathing Details (indicate cue type and reason): Assist to reach bottom of legs in sitting and peri area in standing.     Lower Body Dressing: Sit to/from stand;Moderate assistance Lower Body Dressing Details (indicate cue type and reason): Assist to thread LLE into underwear and pull up over hips.  Toilet Transfer: Minimal assistance;+2 for physical assistance;BSC Toilet Transfer Details (indicate cue type and reason): Pt able to ambulate short distance to [facility] with min assist x 2.  Toileting- Clothing Manipulation and Hygiene: Moderate assistance;Sit to/from stand Toileting - Clothing Manipulation Details (indicate cue type and reason): for peri care in standing. Difficulty pulling up underwear over hips with assist required.      Functional mobility during ADLs: Minimal assistance;+2 for physical assistance;Rolling walker General ADL Comments: Pt engaged in grooming and bathing tasks while seated on bedside commode. Pt required  step by step instructions to complete. Pt able to ambulate short distance to commode with min assist x 2.      Vision       Perception     Praxis      Cognition  Arousal/Alertness: Awake/alert Behavior During Therapy: Flat affect Overall Cognitive Status: Impaired/Different from baseline Area of Impairment: Following commands;Safety/judgement                       Following Commands: Follows one step commands with increased time(min repeat of instructions) Safety/Judgement: Decreased awareness of safety;Decreased awareness of deficits     General Comments: Pt required mod to max encouragement to participate in therapy tasks. Pt impulsive, requiring cues for safety throughout. Pt required step by step instructions for self-care tasks.        Exercises     Shoulder Instructions       General Comments HR into 120s during activity. No signs of distress.     Pertinent Vitals/ Pain       Pain Assessment: No/denies pain Pain Location: generalized Pain Descriptors / Indicators: Restless Pain Intervention(s): Monitored during session;Repositioned  Home Living                                          Prior Functioning/Environment              Frequency           Progress Toward Goals  OT Goals(current goals can now be found in the care plan section)  Progress towards OT goals: Progressing toward goals  Acute Rehab OT Goals Patient Stated Goal: Unable to participate in goal setting. ADL Goals Pt Will Perform Grooming: with min assist;sitting Additional ADL Goal #1: Pt will follow one step commands consistently without cues Additional ADL Goal #2: Pt will maintain EOB sitting with mod A while participating in simple grooming tasks Additional ADL Goal #3: Pt will locate needed ADL items on bedside table with min cues  Plan Discharge plan remains appropriate    Co-evaluation    PT/OT/SLP Co-Evaluation/Treatment: Yes Reason for Co-Treatment: Complexity of the patient's impairments (multi-system involvement);To address functional/ADL transfers PT goals addressed during session: Mobility/safety with  mobility OT goals addressed during session: ADL's and self-care;Strengthening/ROM      AM-PAC OT "6 Clicks" Daily Activity     Outcome Measure   Help from another person eating meals?: A Lot Help from another person taking care of personal grooming?: A Little Help from another person toileting, which includes using toliet, bedpan, or urinal?: A Lot Help from another person bathing (including washing, rinsing, drying)?: A Lot Help from another person to put on and taking off regular upper body clothing?: A Lot Help from another person to put on and taking off regular lower body clothing?: A Lot 6 Click Score: 13    End of Session Equipment Utilized During Treatment: Gait belt;Rolling walker  OT Visit Diagnosis: Unsteadiness on feet (R26.81);Muscle weakness (generalized) (M62.81);Hemiplegia and hemiparesis;Cognitive communication deficit (R41.841) Symptoms and signs involving cognitive functions: Cerebral infarction Hemiplegia - Right/Left: Right Hemiplegia - caused by: Cerebral infarction   Activity Tolerance Patient tolerated treatment well   Patient Left in chair;with call bell/phone within reach;with chair alarm set   Nurse Communication Mobility status        Time: 1245-8099 OT Time Calculation (min): 30 min  Charges: OT General  Charges $OT Visit: 1 Visit OT Treatments $Self Care/Home Management : 8-22 mins  Peterson Ao OTR/L [phone]   Peterson Ao 04/06/2019, 1:52 PM

## 2019-04-06 NOTE — Progress Notes (Signed)
Peripherally Inserted Central Catheter Placement  The IV Nurse has discussed with the patient and/or persons authorized to consent for the patient, the purpose of this procedure and the potential benefits and risks involved with this procedure.  The benefits include less needle sticks, lab draws from the catheter, and the patient may be discharged home with the catheter. Risks include, but not limited to, infection, bleeding, blood clot (thrombus formation), and puncture of an artery; nerve damage and irregular heartbeat and possibility to perform a PICC exchange if needed/ordered by physician.  Alternatives to this procedure were also discussed.  Bard Power PICC patient education guide, fact sheet on infection prevention and patient information card has been provided to patient /or left at bedside.    PICC Placement Documentation  PICC Single Lumen 04/06/19 PICC Right Brachial 35 cm 0 cm (Active)  Indication for Insertion or Continuance of Line Prolonged intravenous therapies 04/06/19 1715  Exposed Catheter (cm) 0 cm 04/06/19 1715  Site Assessment Clean;Dry;Intact 04/06/19 1715  Line Status Flushed;Saline locked;Blood return noted 04/06/19 1715  Dressing Type Transparent;Securing device 04/06/19 1715  Dressing Status Clean;Dry;Intact;Antimicrobial disc in place 04/06/19 1715  Dressing Intervention New dressing 04/06/19 1715  Dressing Change Due 04/13/19 04/06/19 1715       Annett Fabian 04/06/2019, 5:32 PM

## 2019-04-06 NOTE — Progress Notes (Signed)
Physical Therapy Treatment Patient Details Name: Lauren Reyes MRN: [id number] DOB: 1990-01-10 Today's Date: 04/06/2019    History of Present Illness 29 year old female with suspected IV drug abuse who was being interviewed by police tonight and developed right-sided facial weakness, aphasia, and R weakness.  She was brought by EMS to the emergency room work-up revealed occluded left MCA. Revascularization performed by IR. CT head and neck demonstrate multiple small cavitary abscesses in the upper lobes bilaterally. IVC filter placement for RLE DVT on 3/26.    PT Comments    Pt supine in bed on arrival.  Pt soiled in urine and require max cues for encouragement to get OOB and participate in PT session.  PTA and OT performed co treat based on previous session.  Pt is now appropriate for separate sessions to maximize functional gains.  HR elevated to 127 bpm during session.  Continue to recommend aggressive CIR therapies to improve strength and function before returning home.     Follow Up Recommendations  CIR;Supervision/Assistance - 24 hour     Equipment Recommendations  (defer to post acute setting.)    Recommendations for Other Services       Precautions / Restrictions Precautions Precautions: Fall Precaution Comments: intubated, bilateral soft wrist restraints and mits Restrictions Weight Bearing Restrictions: No    Mobility  Bed Mobility Overal bed mobility: Needs Assistance Bed Mobility: Supine to Sit     Supine to sit: Min guard     General bed mobility comments: Increased VCs to move to edge of bed.  Transfers Overall transfer level: Needs assistance Equipment used: Rolling walker (2 wheeled) Transfers: Sit to/from Stand Sit to Stand: Min assist;+2 physical assistance         General transfer comment: Cues for hand placement to and from seated surface.  Pt performed from edge of bed and from commode.  Ambulation/Gait Ambulation/Gait assistance: Min  assist;+2 physical assistance Gait Distance (Feet): 6 Feet Assistive device: Rolling walker (2 wheeled) Gait Pattern/deviations: Step-through pattern;Trunk flexed     General Gait Details: Unsteady gt when shuffling forward.  Pt required assistance to keep RW close to her, she has a tendency to let go of device.   Stairs             Wheelchair Mobility    Modified Rankin (Stroke Patients Only) Modified Rankin (Stroke Patients Only) Pre-Morbid Rankin Score: No symptoms Modified Rankin: Severe disability     Balance Overall balance assessment: Needs assistance   Sitting balance-Leahy Scale: Fair       Standing balance-Leahy Scale: Poor Standing balance comment: external assistance to maintain standing.                            Cognition Arousal/Alertness: Awake/alert Behavior During Therapy: Flat affect Overall Cognitive Status: Impaired/Different from baseline Area of Impairment: Following commands;Safety/judgement                       Following Commands: Follows one step commands with increased time Safety/Judgement: Decreased awareness of safety;Decreased awareness of deficits            Exercises      General Comments        Pertinent Vitals/Pain Pain Assessment: No/denies pain Pain Location: generalized Pain Descriptors / Indicators: Restless Pain Intervention(s): Monitored during session;Repositioned    Home Living  Prior Function            PT Goals (current goals can now be found in the care plan section) Acute Rehab PT Goals Patient Stated Goal: Unable to participate in goal setting. Potential to Achieve Goals: Fair Progress towards PT goals: Progressing toward goals    Frequency    Min 4X/week      PT Plan Current plan remains appropriate    Co-evaluation PT/OT/SLP Co-Evaluation/Treatment: Yes Reason for Co-Treatment: Complexity of the patient's impairments  (multi-system involvement) PT goals addressed during session: Mobility/safety with mobility OT goals addressed during session: ADL's and self-care      AM-PAC PT "6 Clicks" Mobility   Outcome Measure  Help needed turning from your back to your side while in a flat bed without using bedrails?: A Little Help needed moving from lying on your back to sitting on the side of a flat bed without using bedrails?: A Little Help needed moving to and from a bed to a chair (including a wheelchair)?: A Little Help needed standing up from a chair using your arms (e.g., wheelchair or bedside chair)?: A Little Help needed to walk in hospital room?: A Lot Help needed climbing 3-5 steps with a railing? : A Lot 6 Click Score: 16    End of Session Equipment Utilized During Treatment: Gait belt Activity Tolerance: Patient tolerated treatment well Patient left: in chair;with call bell/phone within reach;with chair alarm set(sitter alarm belt applied.) Nurse Communication: Mobility status PT Visit Diagnosis: Muscle weakness (generalized) (M62.81);Other symptoms and signs involving the nervous system (R29.898)     Time: 1287-8676 PT Time Calculation (min) (ACUTE ONLY): 29 min  Charges:  $Gait Training: 8-22 mins                     Lauren Reyes , PTA Acute Rehabilitation Services Pager [phone] Office [phone]     Lauren Reyes Delay 04/06/2019, 12:46 PM

## 2019-04-06 NOTE — Progress Notes (Signed)
Modified Barium Swallow Progress Note  Patient Details  Name: Lauren Reyes MRN: [id number] Date of Birth: October 21, 1990  Today's Date: 04/06/2019  Modified Barium Swallow completed.  Full report located under Chart Review in the Imaging Section.  Brief recommendations include the following:  Clinical Impression  Patient presents with mild oropharyngeal dysphagia. Pt was noted with mild oral residue, but cleared with multiple swallows without any cues. Pharyngeal phase is remarkable for reduced laryngeal closure, resulting in penetration (PAS 2) with thin liquids. Pt was noted with coughing prior to and after administering POs, however, the coughing was delayed and seems unrelated to penetration. No aspiration was noted, despite challenging the pt with drinking POs consecutively. Following consecutive sips, pt was observed to have a sharp inhalation post-swallow, but no pharyngeal residue was noted, and her airway remained clear. Pt's cognitve status also seems to have greatly improved since previous session. Recommend starting a regular diet and thin liquids, ensuring the pt is following strict aspiration precuations. Will f/u briefly for tolerance and completion of speech-language eval.   Swallow Evaluation Recommendations       SLP Diet Recommendations: Regular solids;Thin liquid   Liquid Administration via: Spoon;Cup;Straw   Medication Administration: Whole meds with liquid   Supervision: Staff to assist with self feeding   Compensations: Minimize environmental distractions;Slow rate;Small sips/bites   Postural Changes: Seated upright at 90 degrees   Oral Care Recommendations: Oral care BID       Maudry Mayhew, Student SLP Office: [phone]  04/06/2019,10:17 AM

## 2019-04-06 NOTE — Progress Notes (Signed)
STROKE TEAM PROGRESS NOTE   INTERVAL HISTORY Patient is lying comfortably in bed.  Patient passed swallow eval and will have modified barium swallow today.  Hemoglobin is stable at 8.6 after 2 units of blood transfusion yesterday.  CT abdomen pelvis negative for retroperitoneal hematoma or groin hematoma.  Brilinta is on hold. vital signs are stable except for persistent  Vitals:   04/06/19 0100 04/06/19 0105 04/06/19 0435 04/06/19 0943  BP: 132/65 134/67 (!) 127/51 (!) 141/74  Pulse: (!) 124 (!) 120 (!) 115 (!) 110  Resp:    20  Temp: 98.3 F (36.8 C) 98.3 F (36.8 C) 98.8 F (37.1 C) (!) 97.5 F (36.4 C)  TempSrc: Oral Oral Oral Axillary  SpO2: 100% 100% 100% 100%  Weight:      Height:        CBC:  Recent Labs  Lab 04/05/19 0916 04/06/19 0526  WBC 29.5* 22.2*  NEUTROABS 23.9* 17.1*  HGB 9.3* 8.6*  HCT 27.6* 25.4*  MCV 79.8* 80.1  PLT 370 403*    Basic Metabolic Panel:  Recent Labs  Lab 04/02/19 0013 04/02/19 0159 04/03/19 0502 04/04/19 0405 04/04/19 1543 04/04/19 1543 04/05/19 0506 04/05/19 1419  NA   < >  --  141   < > 144  --  143  --   K   < >  --  4.1   < > 3.6   < > 2.9* 3.4*  CL   < >  --  112*   < > 112*  --  110  --   CO2   < >  --  21*   < > 21*  --  21*  --   GLUCOSE   < >  --  104*   < > 101*  --  87  --   BUN   < >  --  10   < > 7  --  <5*  --   CREATININE   < >  --  0.60   < > 0.59  --  0.52  --   CALCIUM   < >  --  7.7*   < > 7.8*  --  7.9*  --   MG  --  1.9 2.0  --   --   --   --   --   PHOS  --   --  3.3  --   --   --   --   --    < > = values in this interval not displayed.   Lipid Panel:     Component Value Date/Time   CHOL 163 04/02/2019 0012   TRIG 258 (H) 04/02/2019 0012   HDL <10 (L) 04/02/2019 0012   CHOLHDL NOT CALCULATED 04/02/2019 0012   VLDL 52 (H) 04/02/2019 0012   LDLCALC NOT CALCULATED 04/02/2019 0012   HgbA1c:  Lab Results  Component Value Date   HGBA1C 5.9 (H) 04/02/2019   Urine Drug Screen:     Component  Value Date/Time   LABOPIA NONE DETECTED 04/01/2019 2349   COCAINSCRNUR NONE DETECTED 04/01/2019 2349   LABBENZ NONE DETECTED 04/01/2019 2349   AMPHETMU POSITIVE (A) 04/01/2019 2349   THCU POSITIVE (A) 04/01/2019 2349   LABBARB NONE DETECTED 04/01/2019 2349    Alcohol Level No results found for: [facility]  IMAGING past 24h DG Swallowing Func-Speech Pathology  Result Date: 04/06/2019 Objective Swallowing Evaluation: Type of Study: MBS-Modified Barium Swallow Study  Patient Details Name: Lauren Reyes MRN:  [id number] Date of Birth: 02-25-1990 Today's Date: 04/06/2019 Time: SLP Start Time (ACUTE ONLY): 6294 -SLP Stop Time (ACUTE ONLY): 0924 SLP Time Calculation (min) (ACUTE ONLY): 19 min Past Medical History: No past medical history on file. Past Surgical History: Past Surgical History: Procedure Laterality Date . IR CT HEAD LTD  04/01/2019 . IR INTRAVSC STENT CERV CAROTID W/O EMB-PROT MOD SED INC ANGIO  04/01/2019 . IR IVC FILTER PLMT / S&I /IMG GUID/MOD SED  04/03/2019 . IR PERCUTANEOUS ART THROMBECTOMY/INFUSION INTRACRANIAL INC DIAG ANGIO  04/01/2019 . RADIOLOGY WITH ANESTHESIA N/A 04/01/2019  Procedure: IR WITH ANESTHESIA;  Surgeon: Julieanne Cotton, MD;  Location: [facility] OR;  Service: Radiology;  Laterality: N/A; HPI: Pt is a 29 yo female with suspected IVDU admitted with sudden onset R sided weakness and aphasia, now s/p revascularization by IR of L MCA. MRI showed numerous smaller acute infarcts scattered throughout the brain bilaterally. ETT 3/24-3/27.  Subjective: cooperative Assessment / Plan / Recommendation CHL IP CLINICAL IMPRESSIONS 04/06/2019 Clinical Impression Patient presents with mild oropharyngeal dysphagia. Pt was noted with mild oral residue, but cleared with multiple swallows without any cues. Pharyngeal phase is remarkable for reduced laryngeal closure, resulting in penetration (PAS 2) with thin liquids. Pt was noted with coughing prior to and after administering POs, however, the coughing  was delayed and seems unrelated to penetration. No aspiration was noted, despite challenging the pt with drinking POs consecutively. Following consecutive sips, pt was observed to have a sharp inhalation post-swallow, but no pharyngeal residue was noted, and her airway remained clear. Pt's cognitve status also seems to have greatly improved since previous session. Recommend starting a regular diet and thin liquids, ensuring the pt is following strict aspiration precuations. Will f/u briefly for tolerance and completion of speech-language eval. SLP Visit Diagnosis Dysphagia, oropharyngeal phase (R13.12) Attention and concentration deficit following -- Frontal lobe and executive function deficit following -- Impact on safety and function Mild aspiration risk   CHL IP TREATMENT RECOMMENDATION 04/06/2019 Treatment Recommendations Therapy as outlined in treatment plan below   Prognosis 04/06/2019 Prognosis for Safe Diet Advancement Good Barriers to Reach Goals Cognitive deficits Barriers/Prognosis Comment -- CHL IP DIET RECOMMENDATION 04/06/2019 SLP Diet Recommendations Regular solids;Thin liquid Liquid Administration via Spoon;Cup;Straw Medication Administration Whole meds with liquid Compensations Minimize environmental distractions;Slow rate;Small sips/bites Postural Changes Seated upright at 90 degrees   CHL IP OTHER RECOMMENDATIONS 04/06/2019 Recommended Consults -- Oral Care Recommendations Oral care BID Other Recommendations --   CHL IP FOLLOW UP RECOMMENDATIONS 04/06/2019 Follow up Recommendations Inpatient Rehab   CHL IP FREQUENCY AND DURATION 04/06/2019 Speech Therapy Frequency (ACUTE ONLY) min 1 x/week Treatment Duration 1 week      CHL IP ORAL PHASE 04/06/2019 Oral Phase Impaired Oral - Pudding Teaspoon -- Oral - Pudding Cup -- Oral - Honey Teaspoon -- Oral - Honey Cup -- Oral - Nectar Teaspoon -- Oral - Nectar Cup -- Oral - Nectar Straw -- Oral - Thin Teaspoon Lingual/palatal residue Oral - Thin Cup Lingual/palatal  residue Oral - Thin Straw Lingual/palatal residue Oral - Puree Lingual/palatal residue Oral - Mech Soft -- Oral - Regular Lingual/palatal residue Oral - Multi-Consistency -- Oral - Pill -- Oral Phase - Comment --  CHL IP PHARYNGEAL PHASE 04/06/2019 Pharyngeal Phase Impaired Pharyngeal- Pudding Teaspoon -- Pharyngeal -- Pharyngeal- Pudding Cup -- Pharyngeal -- Pharyngeal- Honey Teaspoon -- Pharyngeal -- Pharyngeal- Honey Cup -- Pharyngeal -- Pharyngeal- Nectar Teaspoon -- Pharyngeal -- Pharyngeal- Nectar Cup -- Pharyngeal -- Pharyngeal- Nectar Straw -- Pharyngeal --  Pharyngeal- Thin Teaspoon WFL Pharyngeal -- Pharyngeal- Thin Cup Penetration/Aspiration during swallow;Reduced airway/laryngeal closure Pharyngeal Material enters airway, remains ABOVE vocal cords then ejected out Pharyngeal- Thin Straw Reduced airway/laryngeal closure;Penetration/Aspiration during swallow Pharyngeal Material enters airway, remains ABOVE vocal cords then ejected out Pharyngeal- Puree WFL Pharyngeal -- Pharyngeal- Mechanical Soft -- Pharyngeal -- Pharyngeal- Regular WFL Pharyngeal -- Pharyngeal- Multi-consistency -- Pharyngeal -- Pharyngeal- Pill -- Pharyngeal -- Pharyngeal Comment --  CHL IP CERVICAL ESOPHAGEAL PHASE 04/06/2019 Cervical Esophageal Phase WFL Pudding Teaspoon -- Pudding Cup -- Honey Teaspoon -- Honey Cup -- Nectar Teaspoon -- Nectar Cup -- Nectar Straw -- Thin Teaspoon -- Thin Cup -- Thin Straw -- Puree -- Mechanical Soft -- Regular -- Multi-consistency -- Pill -- Cervical Esophageal Comment -- Mahala Menghini., M.A. CCC-SLP Acute Rehabilitation Services Pager [phone] Office [phone] 04/06/2019, 10:22 AM                 PHYSICAL EXAM  Pleasant young African lady not in distress. . Afebrile. Head is nontraumatic. Neck is supple without bruit.    Cardiac exam no murmur or gallop. Lungs are clear to auscultation. Distal pulses are well felt. Neurological Exam :  Awake alert oriented to place and person.  Expressive  aphasia and speaks only a few words and short sentences.  Dysarthria present.  Good comprehension.  Difficulty with naming and repetition.  Extraocular movements full range without nystagmus.  Severe right lower facial weakness.  Tongue midline.  Motor system exam shows mild weakness of right grip intrinsic hand muscles and orbits left over right upper extremity.  Moves all 4 extremities well otherwise.  Gait not tested. ASSESSMENT/PLAN Lauren Reyes is a 29 y.o. female with unknown medical history, possible heroin abuse, presenting with aphasia and R sided weakness. tPA not given d/t multiple BUL lung nodules w/ concern for septic embolic. Found to have L M3 occlusion and sent to IR for mechanical thrombectomy.  Stroke:  left MCA infarct s/p IR L M3 occlusion w/ TICI3 revascularization, stent to L cervical ICA over focal dissection small pseudoaneurysm. 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 ASPECTS 8    CTA head & neck - L MCA M3 branch occlusion. Multiple B upper lung nodules, ? Septic emboli.   CT perfusion - 31mL core infarct w/ 33ml L MCA penumbra  Cerebral angio - L M3 occlusion s/p TICI3 revascularization. Pipeline sent placed at site of focal dissection associated w/ small pseudoaneurysm distal cervical L ICA.   CT head - 3/24 repeat. L MCA contrast staining. Minimal L frontal lobe white matter infarct.   MRI - 04/02/19 - Small 2-3 cm confluent infarct at the Left Insula with Heidelberg Classification 1b petechial hemorrhage. But numerous smaller acute infarcts scattered throughout the brain.  Several other areas of similar petechial hemorrhage. This constellation remains suspicious for septic emboli. Suggestion also of trace subarachnoid hemorrhage.  MRA - negative for vessel occlusion. The left ICA stent is patent.  CXR - 04/04/19 - Congestive heart failure. Mild patchy opacity of  right mid lung may be due to asymmetric pulmonary edema  LE Doppler - Right common femoral vein acute DVT  CT Abdomen and Pelvis - 04/04/19 - No retroperitoneal hematoma; however, concerning for possible renal and splenic infarcts.  2D Echo EF 55-60%, no vegetation seen  TEE to look for endocarditis tomorrow at 1130 pending    LDL NOT CALCULATED d/t TG  258. direct LDL - 42.9  HgbA1c 5.9  SARS - negative  HIV - non reactive  SCDs for VTE prophylaxis  unknown meds prior to admission, now on aspirin 81 mg daily and Brilinta (ticagrelor) 90 mg bid for stent.   P2Y12 - 115 - for antiplatelet dose adjustment by NIR  Therapy recommendations:  CIR  Disposition:  pending   Acute Respiratory Failure, resolved  Intubated for IR  left intubated for airway protection  Sedated  Weaned to extubation -> stable  Lung Nodules, possible septic emboli ? infective endocarditis Fever   CT chest B lung multifocal nodularity, largest superior RLL. Dif includes: PNA, COVID, septic emboli and mets. Mild cardiomegaly. Heterogenous B nephrogram. ET in mid trachea. GT in stomach. Tissue in mediastinum c/w residual thymic tissue.  WBC 19.5->21.4->26.4->25.6->29.5->22.2  TMax 100.5  No nuchal rigidity - less likely meningitis - not LP candidate given on brilinta   Zosyn 3/25>>3/25  Vanc 3/25>>3/27  Cefepime 3/25>>3/27  Cefazolin 3/27>>  Blood Cx no growth 4 days  Sputum Cx - MODERATE STAPHYLOCOCCUS AUREUS   DVT  LE venous doppler - right common femoral vein DVT  Not candidate for anticoagulation due to large right groin hematoma and possible endocarditis  IVC filter 3/27   Substance abuse, Possible IVDU Sinus Tachycardia, possible withdrawal, stable  UDS:  THC POSITIVE, Amphetamines POSITIVE  Suspicious for heroin use.   Multiple needle marks b/l UEs, L>R.   Treated w/ Fentanyl gtt in ICU  CIWA protocol  R groin hematoma  Sheath removed post IR w/ angioseal  placed  New R groin hematoma on rounds  CT pelvis large diffuse hematoma anterior R groin and upper thigh NOT extending into intra-abd retroperitoneal structures. Limited eval for pseudoaneurysm  LE pseudoaneurysm [place] - No evidence of pseudoaneurysm, or AVF.   Hgb 11.2->12.2->9.3->8.6  Not candidate for anticoagulation at this time  Hypotension . BP goal per IR x 24h 120-140  . Treated w/ phenylephrine . BP stable  . Long-term BP goal normotensive  Possible Hyperlipidemia  LDL NOT CALCULATED d/t TG 258.   Direct LDL 42.9, at Goal LDL < 70  Now on Lipitor 80  Dysphagia, resolved Secondary to stroke Speech on board Cleared for diet  Elevated blood hcG  I-stat hcG 7.4  HCG beta chain - neg  Repeat HcG beta chain - <1  Urine pregnancy test - negative  B12 deficiency   B12 = 175  B12 supplement  Other Stroke Risk Factors  Substance abuse  Other Active Problems  Hyponatremia, resolved  Hospital day # 5  Continue mobilization out of bed.  Therapy consults.  Patient went for modified barium today hence TEE cannot be done and will be scheduled for tomorrow.  Continue aspirin and restart Brilinta. Greater than 50% time during this 25-minute visit was spent on counseling and coordination of care about embolic stroke and answering questions and discussion with care team Antony Contras, MD To contact Stroke Continuity provider, please refer to [email or web address] After hours, contact General Neurology

## 2019-04-06 NOTE — Progress Notes (Signed)
Nutrition Follow-up  DOCUMENTATION CODES:   Not applicable  INTERVENTION:  Ensure Enlive po TID, each supplement provides 350 kcal and 20 grams of protein   NUTRITION DIAGNOSIS:   Increased nutrient needs related to acute illness as evidenced by estimated needs.  Ongoing.  GOAL:   Patient will meet greater than or equal to 90% of their needs  Not met.   MONITOR:   I & O's, Labs, Supplement acceptance, PO intake, Weight trends  REASON FOR ASSESSMENT:   Ventilator    ASSESSMENT:   Pt with suspected IV drug abuse who while being interviewed by police developed left-sided weakness admitted with L MCA s/p IR for revascularization and L ICA stent.  3/24 - OG tube placed, intubated 3/27 - OG removed, extubated 3/29 - Regular diet ordered  Pt reports she is "starving" and is eager to eat. At home, pt reports eating 2x/day. RD unable to obtain more detailed diet history at this time. Pt agreeable to Ensure.   Pt denies wt loss PTA.   Medications reviewed and include: lasix, potassium chloride, vitamin B12  Labs reviewed: K+ 2.9 (L)  UOP: 1,900ml x24 hours I/O: +6,6997ml since admit  Diet Order:   Diet Order            Diet regular Room service appropriate? Yes; Fluid consistency: Thin  Diet effective now              EDUCATION NEEDS:   No education needs have been identified at this time  Skin:  Skin Assessment: Reviewed RN Assessment  Last BM:  unknown  Height:   Ht Readings from Last 1 Encounters:  04/01/19 5' 5" (1.651 m)    Weight:   Wt Readings from Last 1 Encounters:  04/01/19 72.3 kg    BMI:  Body mass index is 26.52 kg/m.  Estimated Nutritional Needs:   Kcal:  1900-2100  Protein:  100-115 grams  Fluid:  > 1.9 L/day    , MS, RD, LDN RD pager number and weekend/on-call pager number located in Amion.  

## 2019-04-06 NOTE — Progress Notes (Signed)
Attempted NG tube placement X 2 without success. MD notified.

## 2019-04-06 NOTE — Consult Note (Signed)
Physical Medicine and Rehabilitation Consult   Reason for Consult: Stroke with functional deficits.  Referring Physician: Dr. Erlinda Hong   HPI: Lauren Reyes is a 29 y.o. female who was admitted on 04/01/2019 with right facial weakness, right-sided weakness, left gaze preference aphasia and confusion.  She was staying at a hotel and staff noticed that she was not acting right and there is also question of possible heroin abuse.  UDS positive for amphetamines and THC. Per chart review has history of IV cocaine/heroin use in the past.  CTA/perfusion head and neck showed positive left MCA proximal M3 branch occlusion as well as multiple bilateral upper lobe nodules with some early cavitation question septic emboli.  She was not felt to be a TPA candidate and underwent cerebral angiogram with emergent large vessel thrombolysis of occluded segment M3 branch with T1 C1 3 revascularization and placement of flow diverter device for small focal dissection of distal left ICA with pseudoaneurysm. MRI/MRA brain showed left ICA stent patent and small 2-3 cm confluent infarct in left insula with petechial hemorrhage and numerous smaller acute infarct scattered throughout the brain suspicious for septic emboli. 2D echo showed EF of 55 to 60% with no wall abnormality, moderate AVR with mild aortic valve sclerosis and mildly elevated PAH.  CT chest done for follow up and showed multifocal nodularity in both lungs largest in RLL with smaller and similar smaller nodules showing central soft tissue attenuation and ill defined ground glass margins scattered about upper and lower lobes. BLE Dopplers showed acute DVT involving right common femoral vein.Respiratory panel negative. RPR and HIV negative.   PCCM recommended repeat BC X 2 and starting patient on Vanc/Zosyn as well as repeat beta hCG-negative. BC X 2 negative and tracheal aspirate showed moderate MSSA. 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 CT abdomen/pelvis showed small amount of hemorrhagic fluid around R-CFV due to recent procedure, negative for retroperitoneal hematoma and incidental findings of wedge shaped areas of hypodensity within spleen concerning for splenic infarcts as well as renal infarcts.  Dr. Penelope Coop consulted for input and recommended monitoring as no evidence of GIB on lavaging of OG tube.  She was transfused with 2 units PRBC and tolerated extubation 03/27.  Stroke felt to be embolic likely due to endocarditis from IVDU and TEE recommended for work up--ancef X 6 weeks recommended by Dr. Charlsie Quest.  On ASA/Brilinta due to stent and follow up H/H stable. NPO due to oral deficits and MBS recommended for evaluation. Therapy evaluation ongoing and CIR recommended due to functional deficits.    Review of Systems  Constitutional: Negative for chills and fever.  HENT: Negative for hearing loss and tinnitus.   Eyes: Negative for blurred vision and double vision.  Respiratory: Negative for cough and shortness of breath.   Cardiovascular: Negative for chest pain and palpitations.  Gastrointestinal: Negative for abdominal pain, constipation, heartburn and nausea.  Genitourinary: Negative for dysuria.  Musculoskeletal: Negative for myalgias.  Skin: Negative for itching and rash.  Neurological: Positive for sensory change (bilateral feet numb occasionally. ), speech change, focal weakness and headaches (occasional).  Psychiatric/Behavioral: Positive for memory loss. The patient does not have insomnia.       History reviewed. No pertinent past medical history.    Past Surgical History:  Procedure Laterality Date  . IR CT HEAD LTD  04/01/2019  . IR INTRAVSC STENT CERV CAROTID W/O EMB-PROT MOD  SED INC ANGIO  04/01/2019  . IR IVC FILTER PLMT / S&I /IMG GUID/MOD SED  04/03/2019  . IR PERCUTANEOUS ART THROMBECTOMY/INFUSION INTRACRANIAL INC DIAG ANGIO  04/01/2019  . RADIOLOGY WITH ANESTHESIA N/A  04/01/2019   Procedure: IR WITH ANESTHESIA;  Surgeon: Julieanne Cotton, MD;  Location: [facility] OR;  Service: Radiology;  Laterality: N/A;    Family History: Patient unable to recall.     Social History: Lives alone in [place]. Has sisters in town--reports that her parents are out of country on mission trip to [place]. She has been unemployed due to [employer].  Smokes 5 cigarettes day and denies alcohol use. History of cocaine/heroin use.    Allergies: No Known Allergies    No medications prior to admission.    Home: Home Living Family/patient expects to be discharged to:: Unsure Additional Comments: pt is estranged from family at baseline, pt's sister reports she saw the patient on monday for the first time in 8 months and patient was at a hotel mobilizing independently, seemed to be herself.  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 [place] last time sister had seen her 8 months ago Functional Status:  Mobility: Bed Mobility Overal bed mobility: (functional mobility deferred 2/2 RR and HR) Transfers General transfer comment: unable       ADL: ADL Overall ADL's : Needs assistance/impaired General ADL Comments: Pt requires total A for all aspects   Cognition: Cognition Overall Cognitive Status: Difficult to assess Orientation Level: Oriented to person, Oriented to place, Disoriented to place, Disoriented to time Cognition Arousal/Alertness: Lethargic(arouses to verbal stimuli) Behavior During Therapy: Restless Overall Cognitive Status: Difficult to assess General Comments: pt follows one step commands ~50% of the time when alert. Is able to demonstrate some ability to follow commands with BLE and RUE Difficult to assess due to: Intubated   Blood pressure (!) 127/51, pulse (!) 115, temperature 98.8 F (37.1 C), temperature source Oral, resp. rate 17, height 5\' 5"  (1.651 m), weight  72.3 kg, SpO2 100 %. Physical Exam  Nursing note and vitals reviewed. Constitutional: She is oriented to person, place, and time. She appears well-developed and well-nourished.  Able to reposition in bed with cues. Asking for food.   HENT:  Head: Normocephalic.  Eyes: Pupils are equal, round, and reactive to light.  Cardiovascular: Normal rate.  Respiratory: Effort normal.  GI: She exhibits no distension. There is no abdominal tenderness.  Musculoskeletal:        General: No edema.     Cervical back: Normal range of motion.  Neurological: She is alert and oriented to person, place, and time.  Flat affect. Right facial weakness with moderate to severe dysarthria/slurring. Poor oro-motor control. Has poor awareness of deficits and internally distracted. Able to follow simple motor commands with cues. Right sided weakness but limited effort. Remained side lying in bed throughout my exam. Senses pain on right.   Skin: Skin is warm and dry.  Psychiatric:  Flat and disengaged.     Results for orders placed or performed during the hospital encounter of 04/01/19 (from the past 24 hour(s))  CBC with Differential/Platelet     Status: Abnormal   Collection Time: 04/05/19  9:16 AM  Result Value Ref Range   WBC 29.5 (H) 4.0 - 10.5 K/uL   RBC 3.46 (L) 3.87 - 5.11 MIL/uL   Hemoglobin 9.3 (L) 12.0 - 15.0 g/dL   HCT 04/07/19 (L) 29.4 - 76.5 %  MCV 79.8 (L) 80.0 - 100.0 fL   MCH 26.9 26.0 - 34.0 pg   MCHC 33.7 30.0 - 36.0 g/dL   RDW 38.8 82.8 - 00.3 %   Platelets 370 150 - 400 K/uL   nRBC 0.1 0.0 - 0.2 %   Neutrophils Relative % 82 %   Neutro Abs 23.9 (H) 1.7 - 7.7 K/uL   Lymphocytes Relative 9 %   Lymphs Abs 2.7 0.7 - 4.0 K/uL   Monocytes Relative 5 %   Monocytes Absolute 1.6 (H) 0.1 - 1.0 K/uL   Eosinophils Relative 0 %   Eosinophils Absolute 0.1 0.0 - 0.5 K/uL   Basophils Relative 0 %   Basophils Absolute 0.1 0.0 - 0.1 K/uL   Immature Granulocytes 4 %   Abs Immature Granulocytes 1.14 (H)  0.00 - 0.07 K/uL  Potassium     Status: Abnormal   Collection Time: 04/05/19  2:19 PM  Result Value Ref Range   Potassium 3.4 (L) 3.5 - 5.1 mmol/L  CBC with Differential/Platelet     Status: Abnormal   Collection Time: 04/06/19  5:26 AM  Result Value Ref Range   WBC 22.2 (H) 4.0 - 10.5 K/uL   RBC 3.17 (L) 3.87 - 5.11 MIL/uL   Hemoglobin 8.6 (L) 12.0 - 15.0 g/dL   HCT 49.1 (L) 79.1 - 50.5 %   MCV 80.1 80.0 - 100.0 fL   MCH 27.1 26.0 - 34.0 pg   MCHC 33.9 30.0 - 36.0 g/dL   RDW 69.7 94.8 - 01.6 %   Platelets 403 (H) 150 - 400 K/uL   nRBC 0.1 0.0 - 0.2 %   Neutrophils Relative % 75 %   Neutro Abs 17.1 (H) 1.7 - 7.7 K/uL   Lymphocytes Relative 13 %   Lymphs Abs 2.8 0.7 - 4.0 K/uL   Monocytes Relative 6 %   Monocytes Absolute 1.2 (H) 0.1 - 1.0 K/uL   Eosinophils Relative 1 %   Eosinophils Absolute 0.2 0.0 - 0.5 K/uL   Basophils Relative 1 %   Basophils Absolute 0.1 0.0 - 0.1 K/uL   Immature Granulocytes 4 %   Abs Immature Granulocytes 0.82 (H) 0.00 - 0.07 K/uL   [place] EKG SITE RITE  Result Date: 04/05/2019 If Site Rite image not attached, placement could not be confirmed due to current cardiac rhythm.   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 Pt with right hemiparesis, aphasia, dysphagia 1. Does the need for close, 24 hr/day medical supervision in concert with the patient's rehab needs make it unreasonable for this patient to be served in a less intensive setting? Yes 2. Co-Morbidities requiring supervision/potential complications: DVT, lung nodules of unclear etiology 3. Due to bladder management, bowel management, safety, skin/wound care, disease management, medication administration, pain management and patient education, does the patient require 24 hr/day rehab nursing? Yes 4. Does the patient require coordinated care of a physician, rehab nurse, therapy disciplines of PT, OT, SLP to address physical and functional deficits in the context of the  above medical diagnosis(es)? Yes and Potentially Addressing deficits in the following areas: balance, endurance, locomotion, strength, transferring, bowel/bladder control, bathing, dressing, feeding, grooming, toileting, cognition, speech, language, swallowing and psychosocial support 5. Can the patient actively participate in an intensive therapy program of at least 3 hrs of therapy per day at least 5 days per week? Potentially 6. The potential for patient to make measurable gains while on inpatient rehab is good 7. Anticipated functional outcomes upon discharge from  inpatient rehab are min assist and mod assist  with PT, min assist and mod assist with OT, supervision and min assist with SLP. 8. Estimated rehab length of stay to reach the above functional goals is: 24-30 days 9. Anticipated discharge destination: Home 10. Overall Rehab/Functional Prognosis: good  RECOMMENDATIONS: This patient's condition is appropriate for continued rehabilitative care in the following setting: CIR Patient has agreed to participate in recommended program. N/A Note that insurance prior authorization may be required for reimbursement for recommended care.  Comment: Patient will need physical assist at discharge. Rehab Admissions Coordinator to follow up.  Thanks,  Ranelle Oyster, MD, [place] [facility]  I have personally performed a face to face diagnostic evaluation of this patient. Additionally, I have examined pertinent labs and radiographic images. I have reviewed and concur with the physician assistant's documentation above.     Jacquelynn Cree, PA-C 04/06/2019

## 2019-04-06 NOTE — Progress Notes (Signed)
Referring Physician(s): Code Stroke- Aroor, Sushanth R  Supervising Physician: Julieanne Cotton  Patient Status:  [facility] - In-pt  Chief Complaint: "Speech"  Subjective:  History of acute CVA s/p cerebral arteriogram with emergent mechanical thrombectomy of left MCA M3 occlusion achieving a TICI 3 revascularization, along with placement of a pipeline flex flow diverter for distal cervical left ICA pseudoaneurysm 04/01/2019 by Dr. Corliss Skains. Patient awake and alert laying in bed. Complains of speech difficulties- speech dysarthric on exam.  She follows simple commands. Moving all extremities. Right groin incision c/d/i.   Allergies: Patient has no known allergies.  Medications: Prior to Admission medications   Not on File     Vital Signs: BP (!) 141/74 (BP Location: Right Arm)   Pulse (!) 110   Temp (!) 97.5 F (36.4 C) (Axillary)   Resp 20   Ht 5\' 5"  (1.651 m)   Wt 159 lb 6.3 oz (72.3 kg)   LMP 02/05/2019 (Approximate) Comment: Neg Preg Test on 04/03/19  SpO2 100%   BMI 26.52 kg/m   Physical Exam Vitals and nursing note reviewed.  Constitutional:      General: She is not in acute distress. Pulmonary:     Effort: Pulmonary effort is normal. No respiratory distress.  Skin:    General: Skin is warm and dry.     Comments: Right groin incision soft without active bleeding or hematoma.  Neurological:     Mental Status: She is alert.     Comments: Alert, awake, and oriented x3. She follows simple commands. Speech dysarthric. PERRL bilaterally. Can spontaneously move all extremities. No pronator drift. Distal pulses 1+ bilaterally.     Imaging: MR ANGIO HEAD WO CONTRAST  Result Date: 04/02/2019 CLINICAL DATA:  29 year old female code stroke presentation status post endovascular reperfusion last night. EXAM: MRI HEAD WITHOUT AND WITH CONTRAST MRA HEAD WITHOUT CONTRAST TECHNIQUE: Multiplanar, multiecho pulse sequences of the brain and surrounding structures  were obtained without and with intravenous contrast. Angiographic images of the head were obtained using MRA technique without contrast. CONTRAST:  36mL GADAVIST GADOBUTROL 1 MMOL/ML IV SOLN COMPARISON:  CT head, CTA head and neck and CT Perfusion yesterday. FINDINGS: MRI HEAD FINDINGS Brain: Confluent 2-3 cm area of restricted diffusion at the left insula. T2 and FLAIR hyperintensity with petechial hemorrhage (Heidelberg Classification 1b, series 14, image 26). But there are numerous other areas of cortical, subcortical, and occasional central white matter restricted diffusion in both cerebral hemispheres. Bilateral MCA and PCA territories are affected. ACA territories are relatively spared. Deep gray nuclei are relatively spared. Brainstem is spared. There is minimal involvement of the cerebellum (series 5, image 54 on the left). These additional foci are associated with similar petechial hemorrhage (e.g. Right occipital lobe series 14, image 24 and series 5, image 69). No malignant hemorrhagic transformation. But there is suggestion of trace volume of superimposed subarachnoid hemorrhage (e.g. Series 11, image 19). No intracranial mass effect. No intraventricular hemorrhage or ventriculomegaly. No subdural collection. Basilar cisterns remain patent. Negative pituitary and cervicomedullary junction. Punctate areas of post ischemic enhancement are occasionally noted. No confluent enhancing lesions. No convincing dural thickening. Vascular: Major intracranial vascular flow voids are preserved. See MRA findings below. The major dural venous sinuses are enhancing and appear patent. Skull and upper cervical spine: Negative visible cervical spine and spinal cord. No suspicious marrow lesion. Sinuses/Orbits: Disconjugate gaze. Mild paranasal sinus mucosal thickening. Other: Mastoids remain clear. Intubated with oral enteric tube in place. MRA HEAD FINDINGS Antegrade flow  in the posterior circulation. Patent distal  vertebral arteries with no stenosis. Patent PICA origins and vertebrobasilar junction. Patent basilar artery, AICA origins, SCA and PCA origins. Diminutive posterior communicating arteries are present. Bilateral PCA branches are within normal limits. Antegrade flow in both ICA siphons. Susceptibility artifact affecting distal cervical left ICA at the site of a vascular stent. The left siphon remains patent. No siphon stenosis identified. Normal ophthalmic and posterior communicating artery origins. Patent carotid termini, MCA and ACA origins. Anterior communicating artery and proximal A2 segments appear normal. There is mild irregularity of the more distal bilateral A2 segments but no significant stenosis. The right MCA M1 segment and bifurcation are patent. No definite right MCA branch stenosis. The left MCA M1 is patent but irregular with mild to moderate stenosis. The left MCA trifurcation is patent. Visible left MCA branches are within normal limits. IMPRESSION: 1. Intracranial MRA is negative for vessel occlusion. The left ICA stent is patent. There is mild to moderate irregularity of the left MCA M1 with some stenosis, and mild similar irregularity of the distal ACAs. 2. Small 2-3 cm confluent infarct at the Left Insula with Heidelberg Classification 1b petechial hemorrhage. 3. But numerous smaller acute infarcts scattered throughout the brain. Deep gray nuclei and posterior fossa relatively spared. Several other areas of similar petechial hemorrhage. This constellation remains suspicious for septic emboli. 4. Suggestion also of trace subarachnoid hemorrhage. No intracranial mass effect. No intraventricular hemorrhage or ventriculomegaly. Electronically Signed   By: Odessa Fleming M.D.   On: 04/02/2019 16:39   MR BRAIN W WO CONTRAST  Result Date: 04/02/2019 CLINICAL DATA:  29 year old female code stroke presentation status post endovascular reperfusion last night. EXAM: MRI HEAD WITHOUT AND WITH CONTRAST MRA HEAD  WITHOUT CONTRAST TECHNIQUE: Multiplanar, multiecho pulse sequences of the brain and surrounding structures were obtained without and with intravenous contrast. Angiographic images of the head were obtained using MRA technique without contrast. CONTRAST:  7mL GADAVIST GADOBUTROL 1 MMOL/ML IV SOLN COMPARISON:  CT head, CTA head and neck and CT Perfusion yesterday. FINDINGS: MRI HEAD FINDINGS Brain: Confluent 2-3 cm area of restricted diffusion at the left insula. T2 and FLAIR hyperintensity with petechial hemorrhage (Heidelberg Classification 1b, series 14, image 26). But there are numerous other areas of cortical, subcortical, and occasional central white matter restricted diffusion in both cerebral hemispheres. Bilateral MCA and PCA territories are affected. ACA territories are relatively spared. Deep gray nuclei are relatively spared. Brainstem is spared. There is minimal involvement of the cerebellum (series 5, image 54 on the left). These additional foci are associated with similar petechial hemorrhage (e.g. Right occipital lobe series 14, image 24 and series 5, image 69). No malignant hemorrhagic transformation. But there is suggestion of trace volume of superimposed subarachnoid hemorrhage (e.g. Series 11, image 19). No intracranial mass effect. No intraventricular hemorrhage or ventriculomegaly. No subdural collection. Basilar cisterns remain patent. Negative pituitary and cervicomedullary junction. Punctate areas of post ischemic enhancement are occasionally noted. No confluent enhancing lesions. No convincing dural thickening. Vascular: Major intracranial vascular flow voids are preserved. See MRA findings below. The major dural venous sinuses are enhancing and appear patent. Skull and upper cervical spine: Negative visible cervical spine and spinal cord. No suspicious marrow lesion. Sinuses/Orbits: Disconjugate gaze. Mild paranasal sinus mucosal thickening. Other: Mastoids remain clear. Intubated with oral  enteric tube in place. MRA HEAD FINDINGS Antegrade flow in the posterior circulation. Patent distal vertebral arteries with no stenosis. Patent PICA origins and vertebrobasilar junction. Patent basilar artery,  AICA origins, SCA and PCA origins. Diminutive posterior communicating arteries are present. Bilateral PCA branches are within normal limits. Antegrade flow in both ICA siphons. Susceptibility artifact affecting distal cervical left ICA at the site of a vascular stent. The left siphon remains patent. No siphon stenosis identified. Normal ophthalmic and posterior communicating artery origins. Patent carotid termini, MCA and ACA origins. Anterior communicating artery and proximal A2 segments appear normal. There is mild irregularity of the more distal bilateral A2 segments but no significant stenosis. The right MCA M1 segment and bifurcation are patent. No definite right MCA branch stenosis. The left MCA M1 is patent but irregular with mild to moderate stenosis. The left MCA trifurcation is patent. Visible left MCA branches are within normal limits. IMPRESSION: 1. Intracranial MRA is negative for vessel occlusion. The left ICA stent is patent. There is mild to moderate irregularity of the left MCA M1 with some stenosis, and mild similar irregularity of the distal ACAs. 2. Small 2-3 cm confluent infarct at the Left Insula with Heidelberg Classification 1b petechial hemorrhage. 3. But numerous smaller acute infarcts scattered throughout the brain. Deep gray nuclei and posterior fossa relatively spared. Several other areas of similar petechial hemorrhage. This constellation remains suspicious for septic emboli. 4. Suggestion also of trace subarachnoid hemorrhage. No intracranial mass effect. No intraventricular hemorrhage or ventriculomegaly. Electronically Signed   By: Odessa Fleming M.D.   On: 04/02/2019 16:39   CT ABDOMEN PELVIS W CONTRAST  Result Date: 04/04/2019 CLINICAL DATA:  Retroperitoneal hematoma. EXAM: CT  ABDOMEN AND PELVIS WITH CONTRAST TECHNIQUE: Multidetector CT imaging of the abdomen and pelvis was performed using the standard protocol following bolus administration of intravenous contrast. CONTRAST:  OMNIPAQUE IOHEXOL 300 MG/ML  SOLN COMPARISON:  CT chest 04/02/2019 FINDINGS: Lower chest: Bilateral lower lobe dependent airspace disease likely reflecting atelectasis. Hepatobiliary: No focal liver abnormality is seen. No gallstones, gallbladder wall thickening, or biliary dilatation. Pancreas: Unremarkable. No pancreatic ductal dilatation or surrounding inflammatory changes. Spleen: Wedge-shaped areas of hypodensity within the spleen, most concerning for splenic infarcts. No perisplenic hemorrhage. Adrenals/Urinary Tract: Normal adrenal glands. Small hypodense fluid attenuating mass in the upper pole of the right kidney measuring 18 mm likely reflecting a small cyst. Somewhat striated areas of hypoenhancement throughout bilateral kidneys likely reflecting small areas of infarction given the splenic abnormality. No obstructive uropathy. Normal bladder Stomach/Bowel: Stomach is within normal limits. No evidence of bowel wall thickening, distention, or inflammatory changes. Nasogastric tube with the tip in the stomach. Vascular/Lymphatic: No significant vascular findings are present. No enlarged abdominal or pelvic lymph nodes. IVC filter with the superior aspect below the renal veins. Small amount of hyperdense material around the right common femoral vein and arteries and within the subcutaneous fat likely reflecting a small amount of hemorrhagic fluid. No retroperitoneal hematoma is present. Reproductive: Uterus and bilateral adnexa are unremarkable. Other: No abdominal wall hernia or abnormality. Small amount of pelvic free fluid. Mild anasarca. Musculoskeletal: No acute osseous abnormality. No aggressive osseous lesion. IMPRESSION: 1. Small amount of hyperdense material around the right common femoral vein  and arteries and within the subcutaneous fat likely reflecting a small amount of hemorrhagic fluid likely from recent percutaneous interventions. No retroperitoneal hematoma is present. 2. Wedge-shaped areas of hypodensity within the spleen, most concerning for splenic infarcts. Somewhat striated areas of hypoenhancement throughout bilateral kidneys likely reflecting small areas of infarction given the splenic abnormality. Electronically Signed   By: Elige Ko   On: 04/04/2019 08:04   IR  IVC FILTER PLMT / S&I Lenise Arena GUID/MOD SED  Result Date: 04/03/2019 CLINICAL DATA:  Stroke and right common femoral vein DVT. Request for IVC filter placement. EXAM: 1. ULTRASOUND GUIDANCE FOR VASCULAR ACCESS OF THE RIGHT INTERNAL JUGULAR VEIN. 2. IVC VENOGRAM. 3. PERCUTANEOUS IVC FILTER PLACEMENT. ANESTHESIA/SEDATION: No sedation administered as the patient is currently intubated. A time-out was performed prior to initiating the procedure. CONTRAST:  30mL OMNIPAQUE IOHEXOL 300 MG/ML  SOLN FLUOROSCOPY TIME:  1 minutes and 12 seconds.  47.1 mGy. PROCEDURE: The procedure, risks, benefits, and alternatives were explained to the patient's mother. Questions regarding the procedure were encouraged and answered. The patient's mother understands and consents to the procedure. A time-out was performed prior to initiating the procedure. The right neck was prepped with chlorhexidine in a sterile fashion, and a sterile drape was applied covering the operative field. A sterile gown and sterile gloves were used for the procedure. Local anesthesia was provided with 1% Lidocaine. Ultrasound was utilized to confirm patency of the right internal jugular vein. Under direct ultrasound guidance, a 21 gauge needle was advanced into the right internal jugular vein with ultrasound image documentation performed. After securing access with a micropuncture dilator, a guidewire was advanced into the inferior vena cava. A deployment sheath was advanced over  the guidewire. This was utilized to perform IVC venography. The deployment sheath was further positioned in an appropriate location for filter deployment. A Bard Denali IVC filter was then advanced in the sheath. This was then fully deployed in the infrarenal IVC. Final filter position was confirmed with a fluoroscopic spot image. After the procedure the sheath was removed and hemostasis obtained with manual compression. COMPLICATIONS: None. FINDINGS: IVC venography demonstrates a normal caliber IVC with no evidence of thrombus. Renal veins are identified bilaterally. The IVC filter was successfully positioned below the level of the renal veins and is appropriately oriented. This IVC filter has both permanent and retrievable indications. IMPRESSION: Placement of percutaneous IVC filter in infrarenal IVC. IVC venogram shows no evidence of IVC thrombus and normal caliber of the inferior vena cava. This filter does have both permanent and retrievable indications. PLAN: This IVC filter is potentially retrievable. The patient will be assessed for filter retrieval by Interventional Radiology in approximately 8-12 weeks. Further recommendations regarding filter retrieval, continued surveillance or declaration of device permanence, will be made at that time. Electronically Signed   By: Irish Lack M.D.   On: 04/03/2019 13:00   DG Chest Port 1 View  Result Date: 04/04/2019 CLINICAL DATA:  Hypoxia and respiratory failure EXAM: PORTABLE CHEST 1 VIEW COMPARISON:  April 03, 2019 FINDINGS: Endotracheal tube and nasogastric tube are stable. The heart size is enlarged. The mediastinal contour is normal. Increased pulmonary interstitium is identified bilaterally. Mild patchy opacity is identified in the right mid lung. There is no pleural effusion. The bony structures are stable. IMPRESSION: Congestive heart failure. Mild patchy opacity of right mid lung may be due to asymmetric pulmonary edema. Electronically Signed   By:  Sherian Rein M.D.   On: 04/04/2019 09:50   DG Chest Port 1 View  Result Date: 04/03/2019 CLINICAL DATA:  Stroke.  Respiratory failure. EXAM: PORTABLE CHEST 1 VIEW COMPARISON:  04/02/2019.  CT 04/02/2019. FINDINGS: Endotracheal tube, NG tube, left subclavian line in stable position. Heart size stable. Progressive bilateral pulmonary infiltrates/edema. Reference is made to prior chest CT report for discussion of pulmonary nodularity present. No pleural effusion or pneumothorax. No acute bony abnormality. IMPRESSION: 1. Endotracheal tube, NG  tube, left subclavian line stable position. 2.  Progressive bilateral pulmonary infiltrates/edema. Electronically Signed   By: Maisie Fus  Register   On: 04/03/2019 06:44   DG CHEST PORT 1 VIEW  Result Date: 04/02/2019 CLINICAL DATA:  Some Central line placement EXAM: PORTABLE CHEST 1 VIEW COMPARISON:  04/02/2019 FINDINGS: Left subclavian central line in place with the tip in the upper right atrium. No pneumothorax. Endotracheal tube and NG tube are unchanged. Left base atelectasis. Right lung clear. No effusions. Heart is normal size. IMPRESSION: Interval placement of left central line with the tip in the upper right atrium. No pneumothorax. Left base atelectasis. Electronically Signed   By: Charlett Nose M.D.   On: 04/02/2019 22:49   DG Swallowing Func-Speech Pathology  Result Date: 04/06/2019 Objective Swallowing Evaluation: Type of Study: MBS-Modified Barium Swallow Study  Patient Details Name: Janequa Timmi Devora MRN: [id number] Date of Birth: [birth year] Today's Date: 04/06/2019 Time: SLP Start Time (ACUTE ONLY): 2725 -SLP Stop Time (ACUTE ONLY): 0924 SLP Time Calculation (min) (ACUTE ONLY): 19 min Past Medical History: No past medical history on file. Past Surgical History: Past Surgical History: Procedure Laterality Date . IR CT HEAD LTD  04/01/2019 . IR INTRAVSC STENT CERV CAROTID W/O EMB-PROT MOD SED INC ANGIO  04/01/2019 . IR IVC FILTER PLMT / S&I /IMG GUID/MOD SED   04/03/2019 . IR PERCUTANEOUS ART THROMBECTOMY/INFUSION INTRACRANIAL INC DIAG ANGIO  04/01/2019 . RADIOLOGY WITH ANESTHESIA N/A 04/01/2019  Procedure: IR WITH ANESTHESIA;  Surgeon: Julieanne Cotton, MD;  Location: [facility] OR;  Service: Radiology;  Laterality: N/A; HPI: Pt is a 29 yo female with suspected IVDU admitted with sudden onset R sided weakness and aphasia, now s/p revascularization by IR of L MCA. MRI showed numerous smaller acute infarcts scattered throughout the brain bilaterally. ETT 3/24-3/27.  Subjective: cooperative Assessment / Plan / Recommendation CHL IP CLINICAL IMPRESSIONS 04/06/2019 Clinical Impression Patient presents with mild oropharyngeal dysphagia. Pt was noted with mild oral residue, but cleared with multiple swallows without any cues. Pharyngeal phase is remarkable for reduced laryngeal closure, resulting in penetration (PAS 2) with thin liquids. Pt was noted with coughing prior to and after administering POs, however, the coughing was delayed and seems unrelated to penetration. No aspiration was noted, despite challenging the pt with drinking POs consecutively. Following consecutive sips, pt was observed to have a sharp inhalation post-swallow, but no pharyngeal residue was noted, and her airway remained clear. Pt's cognitve status also seems to have greatly improved since previous session. Recommend starting a regular diet and thin liquids, ensuring the pt is following strict aspiration precuations. Will f/u briefly for tolerance and completion of speech-language eval. SLP Visit Diagnosis Dysphagia, oropharyngeal phase (R13.12) Attention and concentration deficit following -- Frontal lobe and executive function deficit following -- Impact on safety and function Mild aspiration risk   CHL IP TREATMENT RECOMMENDATION 04/06/2019 Treatment Recommendations Therapy as outlined in treatment plan below   Prognosis 04/06/2019 Prognosis for Safe Diet Advancement Good Barriers to Reach Goals Cognitive  deficits Barriers/Prognosis Comment -- CHL IP DIET RECOMMENDATION 04/06/2019 SLP Diet Recommendations Regular solids;Thin liquid Liquid Administration via Spoon;Cup;Straw Medication Administration Whole meds with liquid Compensations Minimize environmental distractions;Slow rate;Small sips/bites Postural Changes Seated upright at 90 degrees   CHL IP OTHER RECOMMENDATIONS 04/06/2019 Recommended Consults -- Oral Care Recommendations Oral care BID Other Recommendations --   CHL IP FOLLOW UP RECOMMENDATIONS 04/06/2019 Follow up Recommendations Inpatient Rehab   CHL IP FREQUENCY AND DURATION 04/06/2019 Speech Therapy Frequency (ACUTE ONLY) min 1 x/week  Treatment Duration 1 week      CHL IP ORAL PHASE 04/06/2019 Oral Phase Impaired Oral - Pudding Teaspoon -- Oral - Pudding Cup -- Oral - Honey Teaspoon -- Oral - Honey Cup -- Oral - Nectar Teaspoon -- Oral - Nectar Cup -- Oral - Nectar Straw -- Oral - Thin Teaspoon Lingual/palatal residue Oral - Thin Cup Lingual/palatal residue Oral - Thin Straw Lingual/palatal residue Oral - Puree Lingual/palatal residue Oral - Mech Soft -- Oral - Regular Lingual/palatal residue Oral - Multi-Consistency -- Oral - Pill -- Oral Phase - Comment --  CHL IP PHARYNGEAL PHASE 04/06/2019 Pharyngeal Phase Impaired Pharyngeal- Pudding Teaspoon -- Pharyngeal -- Pharyngeal- Pudding Cup -- Pharyngeal -- Pharyngeal- Honey Teaspoon -- Pharyngeal -- Pharyngeal- Honey Cup -- Pharyngeal -- Pharyngeal- Nectar Teaspoon -- Pharyngeal -- Pharyngeal- Nectar Cup -- Pharyngeal -- Pharyngeal- Nectar Straw -- Pharyngeal -- Pharyngeal- Thin Teaspoon WFL Pharyngeal -- Pharyngeal- Thin Cup Penetration/Aspiration during swallow;Reduced airway/laryngeal closure Pharyngeal Material enters airway, remains ABOVE vocal cords then ejected out Pharyngeal- Thin Straw Reduced airway/laryngeal closure;Penetration/Aspiration during swallow Pharyngeal Material enters airway, remains ABOVE vocal cords then ejected out Pharyngeal- Puree  WFL Pharyngeal -- Pharyngeal- Mechanical Soft -- Pharyngeal -- Pharyngeal- Regular WFL Pharyngeal -- Pharyngeal- Multi-consistency -- Pharyngeal -- Pharyngeal- Pill -- Pharyngeal -- Pharyngeal Comment --  CHL IP CERVICAL ESOPHAGEAL PHASE 04/06/2019 Cervical Esophageal Phase WFL Pudding Teaspoon -- Pudding Cup -- Honey Teaspoon -- Honey Cup -- Nectar Teaspoon -- Nectar Cup -- Nectar Straw -- Thin Teaspoon -- Thin Cup -- Thin Straw -- Puree -- Mechanical Soft -- Regular -- Multi-consistency -- Pill -- Cervical Esophageal Comment -- Osie Bond., M.A. CCC-SLP Acute Rehabilitation Services Pager [phone] Office [phone] 04/06/2019, 10:22 AM              VAS [place] GROIN PSEUDOANEURYSM  Result Date: 04/02/2019  ARTERIAL PSEUDOANEURYSM  Exam: Right groin Indications: Patient complains of Hematoma, bruising and groin pain. History: 04/01/2019 - IR WITH ANESTHESIA. Limitations: Poor ultrasound/tissue interface and patient positioning. Comparison Study: No prior studies. Performing Technologist: Oliver Hum RVT  Examination Guidelines: A complete evaluation includes B-mode imaging, spectral Doppler, color Doppler, and power Doppler as needed of all accessible portions of each vessel. Bilateral testing is considered an integral part of a complete examination. Limited examinations for reoccurring indications may be performed as noted.  Summary: No evidence of pseudoaneurysm, or AVF.  Diagnosing physician: Servando Snare MD Electronically signed by Servando Snare MD on 04/02/2019 at 4:40:40 PM.   --------------------------------------------------------------------------------    Final    ECHOCARDIOGRAM COMPLETE  Result Date: 04/02/2019    ECHOCARDIOGRAM REPORT   Patient Name:   WENDA VANSCHAICK [facility] Date of Exam: 04/02/2019 Medical Rec #:  [id number]             Height:       65.0 in Accession #:    [id number]            Weight:       159.4 lb Date of Birth:  May 19, 1990             BSA:          1.796 m Patient Age:    28  years              BP:           121/69 mmHg Patient Gender: F                     HR:  76 bpm. Exam Location:  Inpatient Procedure: 2D Echo, Cardiac Doppler and Color Doppler Indications:    Stroke 434.91 / I163.9  History:        Patient has no prior history of Echocardiogram examinations.  Sonographer:    Tiffany Dance Referring Phys: [id number] IONGEXBM R AROOR  Sonographer Comments: Echo performed with patient supine and on artificial respirator. IMPRESSIONS  1. Normal LV function; moderate AI; mild MR and TR.  2. Left ventricular ejection fraction, by estimation, is 55 to 60%. The left ventricle has normal function. The left ventricle has no regional wall motion abnormalities. Left ventricular diastolic parameters were normal.  3. Right ventricular systolic function is normal. The right ventricular size is normal. There is mildly elevated pulmonary artery systolic pressure.  4. The mitral valve is normal in structure. Mild mitral valve regurgitation. No evidence of mitral stenosis.  5. The aortic valve is tricuspid. Aortic valve regurgitation is moderate. Mild aortic valve sclerosis is present, with no evidence of aortic valve stenosis.  6. The inferior vena cava is normal in size with greater than 50% respiratory variability, suggesting right atrial pressure of 3 mmHg. FINDINGS  Left Ventricle: Left ventricular ejection fraction, by estimation, is 55 to 60%. The left ventricle has normal function. The left ventricle has no regional wall motion abnormalities. The left ventricular internal cavity size was normal in size. There is  no left ventricular hypertrophy. Left ventricular diastolic parameters were normal. Right Ventricle: The right ventricular size is normal.Right ventricular systolic function is normal. There is mildly elevated pulmonary artery systolic pressure. The tricuspid regurgitant velocity is 2.44 m/s, and with an assumed right atrial pressure of  8 mmHg, the estimated right ventricular  systolic pressure is 31.8 mmHg. Left Atrium: Left atrial size was normal in size. Right Atrium: Right atrial size was normal in size. Pericardium: There is no evidence of pericardial effusion. Mitral Valve: The mitral valve is normal in structure. Normal mobility of the mitral valve leaflets. Mild mitral valve regurgitation. No evidence of mitral valve stenosis. Tricuspid Valve: The tricuspid valve is normal in structure. Tricuspid valve regurgitation is mild . No evidence of tricuspid stenosis. Aortic Valve: The aortic valve is tricuspid. Aortic valve regurgitation is moderate. Aortic regurgitation PHT measures 408 msec. Mild aortic valve sclerosis is present, with no evidence of aortic valve stenosis. Pulmonic Valve: The pulmonic valve was normal in structure. Pulmonic valve regurgitation is not visualized. No evidence of pulmonic stenosis. Aorta: The aortic root is normal in size and structure. Venous: The inferior vena cava is normal in size with greater than 50% respiratory variability, suggesting right atrial pressure of 3 mmHg. IAS/Shunts: No atrial level shunt detected by color flow Doppler. Additional Comments: Normal LV function; moderate AI; mild MR and TR.  LEFT VENTRICLE PLAX 2D LVIDd:         4.94 cm  Diastology LVIDs:         3.70 cm  LV e' lateral:   18.50 cm/s LV PW:         0.81 cm  LV E/e' lateral: 5.5 LV IVS:        0.98 cm  LV e' medial:    12.80 cm/s LVOT diam:     1.90 cm  LV E/e' medial:  7.9 LV SV:         61 LV SV Index:   34 LVOT Area:     2.84 cm  RIGHT VENTRICLE  IVC RV Basal diam:  2.62 cm    IVC diam: 1.74 cm RV S prime:     8.27 cm/s TAPSE (M-mode): 1.5 cm LEFT ATRIUM             Index       RIGHT ATRIUM           Index LA diam:        3.80 cm 2.12 cm/m  RA Area:     14.30 cm LA Vol (A2C):   71.6 ml 39.86 ml/m RA Volume:   39.30 ml  21.88 ml/m LA Vol (A4C):   43.7 ml 24.33 ml/m LA Biplane Vol: 56.1 ml 31.23 ml/m  AORTIC VALVE LVOT Vmax:   107.00 cm/s LVOT Vmean:   72.900 cm/s LVOT VTI:    0.214 m AI PHT:      408 msec  AORTA Ao Root diam: 2.70 cm Ao Asc diam:  2.20 cm MITRAL VALVE                TRICUSPID VALVE MV Area (PHT): 4.23 cm     TR Peak grad:   23.8 mmHg MV Decel Time: 180 msec     TR Vmax:        244.00 cm/s MV E velocity: 101.50 cm/s                             SHUNTS                             Systemic VTI:  0.21 m                             Systemic Diam: 1.90 cm Olga Millers MD Electronically signed by Olga Millers MD Signature Date/Time: 04/02/2019/1:19:08 PM    Final    VAS [place] LOWER EXTREMITY VENOUS (DVT)  Result Date: 04/02/2019  Lower Venous DVTStudy Indications: Stroke.  Risk Factors: None identified. Limitations: Poor ultrasound/tissue interface and patient positioning. Comparison Study: No prior studies. Performing Technologist: Chanda Busing RVT  Examination Guidelines: A complete evaluation includes B-mode imaging, spectral Doppler, color Doppler, and power Doppler as needed of all accessible portions of each vessel. Bilateral testing is considered an integral part of a complete examination. Limited examinations for reoccurring indications may be performed as noted. The reflux portion of the exam is performed with the patient in reverse Trendelenburg.  +---------+---------------+---------+-----------+----------+--------------+ RIGHT    CompressibilityPhasicitySpontaneityPropertiesThrombus Aging +---------+---------------+---------+-----------+----------+--------------+ CFV      Partial        Yes      Yes                  Acute          +---------+---------------+---------+-----------+----------+--------------+ SFJ      Full                                                        +---------+---------------+---------+-----------+----------+--------------+ FV Prox  Full                                                        +---------+---------------+---------+-----------+----------+--------------+  FV Mid   Full                                                         +---------+---------------+---------+-----------+----------+--------------+ FV DistalFull                                                        +---------+---------------+---------+-----------+----------+--------------+ PFV      Full                                                        +---------+---------------+---------+-----------+----------+--------------+ POP      Full           Yes      Yes                                 +---------+---------------+---------+-----------+----------+--------------+ PTV      Full                                                        +---------+---------------+---------+-----------+----------+--------------+ PERO     Full                                                        +---------+---------------+---------+-----------+----------+--------------+ EIV                     Yes      Yes                                 +---------+---------------+---------+-----------+----------+--------------+   +---------+---------------+---------+-----------+----------+--------------+ LEFT     CompressibilityPhasicitySpontaneityPropertiesThrombus Aging +---------+---------------+---------+-----------+----------+--------------+ CFV      Full           Yes      Yes                                 +---------+---------------+---------+-----------+----------+--------------+ SFJ      Full                                                        +---------+---------------+---------+-----------+----------+--------------+ FV Prox  Full                                                        +---------+---------------+---------+-----------+----------+--------------+  FV Mid   Full                                                        +---------+---------------+---------+-----------+----------+--------------+ FV DistalFull                                                         +---------+---------------+---------+-----------+----------+--------------+ PFV      Full                                                        +---------+---------------+---------+-----------+----------+--------------+ POP      Full           Yes      Yes                                 +---------+---------------+---------+-----------+----------+--------------+ PTV      Full                                                        +---------+---------------+---------+-----------+----------+--------------+ PERO     Full                                                        +---------+---------------+---------+-----------+----------+--------------+     Summary: RIGHT: - Findings consistent with acute deep vein thrombosis involving the right common femoral vein. - No cystic structure found in the popliteal fossa.  LEFT: - There is no evidence of deep vein thrombosis in the lower extremity.  - No cystic structure found in the popliteal fossa.  *See table(s) above for measurements and observations. Electronically signed by Lemar Livings MD on 04/02/2019 at 4:40:15 PM.    Final    [place] EKG SITE RITE  Result Date: 04/05/2019 If Site Rite image not attached, placement could not be confirmed due to current cardiac rhythm.   Labs:  CBC: Recent Labs    04/04/19 0405 04/04/19 0405 04/04/19 0602 04/04/19 1543 04/05/19 0916 04/06/19 0526  WBC 29.5*  --   --  27.8* 29.5* 22.2*  HGB 6.7*   < > 6.5* 8.7* 9.3* 8.6*  HCT 20.4*   < > 19.9* 26.2* 27.6* 25.4*  PLT 349  --   --  326 370 403*   < > = values in this interval not displayed.    COAGS: Recent Labs    04/01/19 1943  INR 1.5*  APTT 31    BMP: Recent Labs    04/03/19 0502 04/03/19 0502 04/04/19 0405 04/04/19 1543 04/05/19 0506 04/05/19 1419  NA 141  --  142 144 143  --   K  4.1   < > 3.3* 3.6 2.9* 3.4*  CL 112*  --  110 112* 110  --   CO2 21*  --  21* 21* 21*  --   GLUCOSE 104*  --  101* 101* 87  --     BUN 10  --  7 7 <5*  --   CALCIUM 7.7*  --  7.7* 7.8* 7.9*  --   CREATININE 0.60  --  0.61 0.59 0.52  --   GFRNONAA >60  --  >60 >60 >60  --   GFRAA >60  --  >60 >60 >60  --    < > = values in this interval not displayed.    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 Corliss Skains. Patient's condition improving- awake and alert, following commands, speech dysarthric, moving all extremities. Right groin incision stable, distal pulses 1+ bilaterally. Continue taking Brilinta 90 mg twice daily and Aspirin 81 mg once daily. Plan to follow-up with Dr. Corliss Skains in clinic 4 weeks after discharge- order placed to facilitate this. Further plans per neurology/GI- appreciate and agree with management. Please call [facility] with questions/concerns.   Electronically Signed: Elwin Mocha, PA-C 04/06/2019, 10:38 AM   I spent a total of 25 Minutes at the the patient's bedside AND on the patient's hospital floor or unit, greater than 50% of which was counseling/coordinating care for stroke follow-up.

## 2019-04-07 ENCOUNTER — Inpatient Hospital Stay (HOSPITAL_COMMUNITY): Payer: Medicaid Other | Admitting: Certified Registered Nurse Anesthetist

## 2019-04-07 ENCOUNTER — Encounter (HOSPITAL_COMMUNITY)
Admission: EM | Disposition: A | Discharge status: Home / Self Care | Payer: Self-pay | Source: Home / Self Care | Attending: Cardiothoracic Surgery

## 2019-04-07 ENCOUNTER — Encounter (HOSPITAL_COMMUNITY): Payer: Self-pay | Admitting: Neurology

## 2019-04-07 ENCOUNTER — Inpatient Hospital Stay (HOSPITAL_COMMUNITY): Payer: Medicaid Other

## 2019-04-07 ENCOUNTER — Other Ambulatory Visit: Payer: Self-pay

## 2019-04-07 DIAGNOSIS — R29810 Facial weakness: Secondary | ICD-10-CM

## 2019-04-07 DIAGNOSIS — I39 Endocarditis and heart valve disorders in diseases classified elsewhere: Secondary | ICD-10-CM

## 2019-04-07 DIAGNOSIS — I339 Acute and subacute endocarditis, unspecified: Secondary | ICD-10-CM

## 2019-04-07 DIAGNOSIS — F1990 Other psychoactive substance use, unspecified, uncomplicated: Secondary | ICD-10-CM | POA: Diagnosis present

## 2019-04-07 DIAGNOSIS — R011 Cardiac murmur, unspecified: Secondary | ICD-10-CM

## 2019-04-07 DIAGNOSIS — I76 Septic arterial embolism: Secondary | ICD-10-CM

## 2019-04-07 DIAGNOSIS — F1910 Other psychoactive substance abuse, uncomplicated: Secondary | ICD-10-CM | POA: Diagnosis present

## 2019-04-07 DIAGNOSIS — I351 Nonrheumatic aortic (valve) insufficiency: Secondary | ICD-10-CM

## 2019-04-07 DIAGNOSIS — I340 Nonrheumatic mitral (valve) insufficiency: Secondary | ICD-10-CM

## 2019-04-07 DIAGNOSIS — D649 Anemia, unspecified: Secondary | ICD-10-CM | POA: Diagnosis present

## 2019-04-07 DIAGNOSIS — I2690 Septic pulmonary embolism without acute cor pulmonale: Secondary | ICD-10-CM

## 2019-04-07 DIAGNOSIS — L608 Other nail disorders: Secondary | ICD-10-CM

## 2019-04-07 DIAGNOSIS — I748 Embolism and thrombosis of other arteries: Secondary | ICD-10-CM

## 2019-04-07 DIAGNOSIS — I6310 Cerebral infarction due to embolism of unspecified precerebral artery: Secondary | ICD-10-CM | POA: Diagnosis present

## 2019-04-07 DIAGNOSIS — I361 Nonrheumatic tricuspid (valve) insufficiency: Secondary | ICD-10-CM

## 2019-04-07 DIAGNOSIS — I330 Acute and subacute infective endocarditis: Secondary | ICD-10-CM

## 2019-04-07 DIAGNOSIS — R4701 Aphasia: Secondary | ICD-10-CM

## 2019-04-07 HISTORY — PX: TEE WITHOUT CARDIOVERSION: SHX5443

## 2019-04-07 HISTORY — DX: Anemia, unspecified: D64.9

## 2019-04-07 HISTORY — DX: Other psychoactive substance use, unspecified, uncomplicated: F19.90

## 2019-04-07 HISTORY — PX: BUBBLE STUDY: SHX6837

## 2019-04-07 LAB — CBC WITH DIFFERENTIAL/PLATELET
Abs Immature Granulocytes: 0.79 10*3/uL — ABNORMAL HIGH (ref 0.00–0.07)
Basophils Absolute: 0.1 10*3/uL (ref 0.0–0.1)
Basophils Relative: 1 %
Eosinophils Absolute: 0.1 10*3/uL (ref 0.0–0.5)
Eosinophils Relative: 1 %
HCT: 28.4 % — ABNORMAL LOW (ref 36.0–46.0)
Hemoglobin: 9.5 g/dL — ABNORMAL LOW (ref 12.0–15.0)
Immature Granulocytes: 4 %
Lymphocytes Relative: 16 %
Lymphs Abs: 3.3 10*3/uL (ref 0.7–4.0)
MCH: 26.7 pg (ref 26.0–34.0)
MCHC: 33.5 g/dL (ref 30.0–36.0)
MCV: 79.8 fL — ABNORMAL LOW (ref 80.0–100.0)
Monocytes Absolute: 1.2 10*3/uL — ABNORMAL HIGH (ref 0.1–1.0)
Monocytes Relative: 6 %
Neutro Abs: 16 10*3/uL — ABNORMAL HIGH (ref 1.7–7.7)
Neutrophils Relative %: 72 %
Platelets: 459 10*3/uL — ABNORMAL HIGH (ref 150–400)
RBC: 3.56 MIL/uL — ABNORMAL LOW (ref 3.87–5.11)
RDW: 15.9 % — ABNORMAL HIGH (ref 11.5–15.5)
WBC: 21.5 10*3/uL — ABNORMAL HIGH (ref 4.0–10.5)
nRBC: 0 % (ref 0.0–0.2)

## 2019-04-07 LAB — CULTURE, BLOOD (ROUTINE X 2)
Culture: NO GROWTH
Culture: NO GROWTH
Special Requests: ADEQUATE

## 2019-04-07 LAB — GLUCOSE, CAPILLARY: Glucose-Capillary: 93 mg/dL (ref 70–99)

## 2019-04-07 SURGERY — ECHOCARDIOGRAM, TRANSESOPHAGEAL
Anesthesia: General

## 2019-04-07 MED ORDER — SODIUM CHLORIDE 0.9 % IV SOLN: INTRAVENOUS | Status: DC

## 2019-04-07 MED ORDER — DEXMEDETOMIDINE HCL 200 MCG/2ML IV SOLN
INTRAVENOUS | Status: DC | PRN
  Administered 2019-04-07: 16 ug via INTRAVENOUS

## 2019-04-07 MED ORDER — VANCOMYCIN HCL 1500 MG/300ML IV SOLN
1500.0000 mg | Freq: Once | INTRAVENOUS | Status: AC
  Administered 2019-04-07: 1500 mg via INTRAVENOUS
  Filled 2019-04-07: qty 300

## 2019-04-07 MED ORDER — SODIUM CHLORIDE 0.9 % IV SOLN
3.0000 g | Freq: Four times a day (QID) | INTRAVENOUS | Status: DC
  Administered 2019-04-07 – 2019-04-08 (×3): 3 g via INTRAVENOUS
  Filled 2019-04-07 (×8): qty 8

## 2019-04-07 MED ORDER — VANCOMYCIN HCL IN DEXTROSE 1-5 GM/200ML-% IV SOLN
1000.0000 mg | Freq: Two times a day (BID) | INTRAVENOUS | Status: DC
  Administered 2019-04-08 – 2019-04-10 (×5): 1000 mg via INTRAVENOUS
  Filled 2019-04-07 (×6): qty 200

## 2019-04-07 MED ORDER — LIDOCAINE 2% (20 MG/ML) 5 ML SYRINGE
INTRAMUSCULAR | Status: DC | PRN
  Administered 2019-04-07: 60 mg via INTRAVENOUS

## 2019-04-07 MED ORDER — PROPOFOL 10 MG/ML IV BOLUS
INTRAVENOUS | Status: DC | PRN
  Administered 2019-04-07: 40 mg via INTRAVENOUS
  Administered 2019-04-07: 70 mg via INTRAVENOUS
  Administered 2019-04-07 (×2): 30 mg via INTRAVENOUS

## 2019-04-07 MED ORDER — SODIUM CHLORIDE 0.9 % IV SOLN: INTRAVENOUS | Status: DC | PRN

## 2019-04-07 MED ORDER — PROPOFOL 500 MG/50ML IV EMUL
INTRAVENOUS | Status: DC | PRN
  Administered 2019-04-07: 125 ug/kg/min via INTRAVENOUS

## 2019-04-07 NOTE — Consult Note (Signed)
[facility] for Infect[facility]    Date of Admission:  04/01/2019   Total days of antibiotics 7        Day 4 cefazolin              Reason for Consult: Culture-negative endocarditis    Referring Provider: Dr. 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  Her infection may be due to MSSA with negative blood cultures because cefazolin was given before blood cultures were obtained.  However, I will go ahead and treat her with vancomycin and ampicillin sulbactam.  This will cover MSSA and other causes of culture-negative endocarditis.  Plan: 1. Change cefazolin to vancomycin and ampicillin sulbactam  Active Problems:   Infective endocarditis   Acute ischemic left MCA stroke (HCC)   Middle cerebral artery embolism, left   Acute respiratory failure (HCC)   DVT (deep venous thrombosis) ([place])   Encounter for central line placement   Endotracheal tube present   Cerebrovascular accident (CVA) due to embolism of precerebral artery (HCC)   Normocytic anemia   Polysubstance abuse ([place])   IVDU (intravenous drug user)   Scheduled Meds: . sodium chloride   Intravenous Once  . sodium chloride   Intravenous Once  . aspirin  81 mg Oral Daily   Or  . aspirin  81 mg Per Tube Daily  . atorvastatin  80 mg Per Tube q1800  . chlorhexidine gluconate (MEDLINE KIT)  15 mL Mouth Rinse BID  . Chlorhexidine Gluconate Cloth  6 each Topical Daily  . feeding supplement (ENSURE ENLIVE)  237 mL Oral TID BM  . furosemide  20 mg Intravenous Once  . potassium chloride  40 mEq Oral Once  . sodium chloride flush  10-40 mL Intracatheter Q12H  . sodium chloride flush  3 mL Intravenous Once  . ticagrelor  90 mg Oral BID  . vitamin B-12  1,000 mcg Per Tube Daily   Or  . vitamin B-12  1,000 mcg Oral Daily   Continuous Infusions: . sodium chloride Stopped (04/05/19 1241)  . sodium chloride 10 mL/hr at 04/05/19 1300    . sodium chloride 75 mL/hr at 04/07/19 1313  . ampicillin-sulbactam (UNASYN) IV     PRN Meds:.sodium chloride, acetaminophen **OR** acetaminophen (TYLENOL) oral liquid 160 mg/5 mL **OR** acetaminophen, ondansetron (ZOFRAN) IV, senna-docusate, sodium chloride flush  HPI: Lauren Reyes is a 29 y.o. female was admitted on 04/01/2019 right-sided weakness aphasia and a temperature of 101.4 degrees.  She was found to have a large left middle cerebral artery CVA.  CT scans also revealed bilateral pulmonary nodules and splenic infarction.  On empiric antibiotic.  Blood cultures were negative but it appears that she had received cefazolin before the blood cultures were drawn.  Sputum culture grew MSSA emboli to her brain she has a history addiction, IVDU polysubstance abuse with heroin and amphetamines.  TEE today revealed vegetations on the aortic, mitral and tricuspid valves and severe aortic regurgitation.   Review of Systems: Review of Systems  Unable to perform ROS: Other    History reviewed. No pertinent past medical history.  Social History   Tobacco Use  . Smoking status: Not on file  Substance Use Topics  . Alcohol use: Not on file  . Drug use: Not on file    History reviewed. No pertinent family history. No Known Allergies  OBJECTIVE: Blood pressure (!) 110/59, pulse  99, temperature 98.7 F (37.1 C), temperature source Oral, resp. rate (!) 22, height '5\' 5"'  (1.651 m), weight 72.3 kg, last menstrual period 02/05/2019, SpO2 100 %.  Physical Exam Constitutional:      Comments: She is alert and resting quietly in bed.  She is aphasic but can answer some questions with one-word answers.  Cardiovascular:     Rate and Rhythm: Regular rhythm. Tachycardia present.     Heart sounds: No murmur.     Comments: He has a loud to and fro systolic/diastolic murmur. Pulmonary:     Effort: Pulmonary effort is normal.     Breath sounds: Normal breath sounds.  Abdominal:     Palpations:  Abdomen is soft.     Tenderness: There is no abdominal tenderness.  Musculoskeletal:        General: No swelling or tenderness.  Skin:    Findings: No rash.     Comments: She has a splinter hemorrhage in her right long fingernail.  Neurological:     Comments: She has a right-sided facial droop and right-sided weakness.     Lab Results Lab Results  Component Value Date   WBC 21.5 (H) 04/07/2019   HGB 9.5 (L) 04/07/2019   HCT 28.4 (L) 04/07/2019   MCV 79.8 (L) 04/07/2019   PLT 459 (H) 04/07/2019    Lab Results  Component Value Date   CREATININE 0.52 04/05/2019   BUN <5 (L) 04/05/2019   NA 143 04/05/2019   K 3.4 (L) 04/05/2019   CL 110 04/05/2019   CO2 21 (L) 04/05/2019    Lab Results  Component Value Date   ALT 40 04/01/2019   AST 63 (H) 04/01/2019   ALKPHOS 112 04/01/2019   BILITOT 0.9 04/01/2019     Microbiology: Recent Results (from the past 240 hour(s))  Respiratory Panel by RT PCR (Flu A&B, Covid) - Nasopharyngeal Swab     Status: None   Collection Time: 04/01/19  8:26 PM   Specimen: Nasopharyngeal Swab  Result Value Ref Range Status   SARS Coronavirus 2 by RT PCR NEGATIVE NEGATIVE Final    Comment: (NOTE) SARS-CoV-2 target nucleic acids are NOT DETECTED. The SARS-CoV-2 RNA is generally detectable in upper respiratoy specimens during the acute phase of infection. The lowest concentration of SARS-CoV-2 viral copies this assay can detect is 131 copies/mL. A negative result does not preclude SARS-Cov-2 infection and should not be used as the sole basis for treatment or other patient management decisions. A negative result may occur with  improper specimen collection/handling, submission of specimen other than nasopharyngeal swab, presence of viral mutation(s) within the areas targeted by this assay, and inadequate number of viral copies (<131 copies/mL). A negative result must be combined with clinical observations, patient history, and epidemiological  information. The expected result is Negative. Fact Sheet for Patients:  PinkCheek.be Fact Sheet for Healthcare Providers:  GravelBags.it This test is not yet ap proved or cleared by the [place] FDA and  has been authorized for detection and/or diagnosis of SARS-CoV-2 by FDA under an Emergency Use Authorization (EUA). This EUA will remain  in effect (meaning this test can be used) for the duration of the COVID-19 declaration under Section 564(b)(1) of the Act, 21 U.S.C. section 360bbb-3(b)(1), unless the authorization is terminated or revoked sooner.    Influenza A by PCR NEGATIVE NEGATIVE Final   Influenza B by PCR NEGATIVE NEGATIVE Final    Comment: (NOTE) The Xpert Xpress SARS-CoV-2/FLU/RSV assay is intended as an aid  in  the diagnosis of influenza from Nasopharyngeal swab specimens and  should not be used as a sole basis for treatment. Nasal washings and  aspirates are unacceptable for Xpert Xpress SARS-CoV-2/FLU/RSV  testing. Fact Sheet for Patients: PinkCheek.be Fact Sheet for Healthcare Providers: GravelBags.it This test is not yet approved or cleared by the [place] FDA and  has been authorized for detection and/or diagnosis of SARS-CoV-2 by  FDA under an Emergency Use Authorization (EUA). This EUA will remain  in effect (meaning this test can be used) for the duration of the  Covid-19 declaration under Section 564(b)(1) of the Act, 21  U.S.C. section 360bbb-3(b)(1), unless the authorization is  terminated or revoked. Performed at [facility], [place] [address], [place], [place] [address]   MRSA PCR Screening     Status: None   Collection Time: 04/02/19 12:01 AM   Specimen: Nasopharyngeal  Result Value Ref Range Status   MRSA by PCR NEGATIVE NEGATIVE Final    Comment:        The GeneXpert MRSA Assay (FDA approved for NASAL specimens only),  is one component of a comprehensive MRSA colonization surveillance program. It is not intended to diagnose MRSA infection nor to guide or monitor treatment for MRSA infections. Performed at [facility], [place] [address], [place], [place] [address]   Culture, blood (Routine X 2) w Reflex to ID Panel     Status: None   Collection Time: 04/02/19 12:13 AM   Specimen: BLOOD RIGHT HAND  Result Value Ref Range Status   Specimen Description BLOOD RIGHT HAND  Final   Special Requests   Final    BOTTLES DRAWN AEROBIC AND ANAEROBIC Blood Culture adequate volume   Culture   Final    NO GROWTH 5 DAYS Performed at [facility], [place] [address], [place], [place] [address]    Report Status 04/07/2019 FINAL  Final  Culture, respiratory (non-expectorated)     Status: None   Collection Time: 04/02/19  2:50 AM   Specimen: Tracheal Aspirate; Respiratory  Result Value Ref Range Status   Specimen Description TRACHEAL ASPIRATE  Final   Special Requests NONE  Final   Gram Stain   Final    RARE WBC PRESENT, PREDOMINANTLY MONONUCLEAR RARE GRAM POSITIVE COCCI IN CLUSTERS Performed at [facility], [place] [address] [place], [place] [address]    Culture MODERATE STAPHYLOCOCCUS AUREUS  Final   Report Status 04/04/2019 FINAL  Final   Organism ID, Bacteria STAPHYLOCOCCUS AUREUS  Final      Susceptibility   Staphylococcus aureus - MIC*    CIPROFLOXACIN <=0.5 SENSITIVE Sensitive     ERYTHROMYCIN <=0.25 SENSITIVE Sensitive     GENTAMICIN <=0.5 SENSITIVE Sensitive     OXACILLIN 0.5 SENSITIVE Sensitive     TETRACYCLINE <=1 SENSITIVE Sensitive     VANCOMYCIN 1 SENSITIVE Sensitive     TRIMETH/SULFA <=10 SENSITIVE Sensitive     CLINDAMYCIN <=0.25 SENSITIVE Sensitive     RIFAMPIN <=0.5 SENSITIVE Sensitive     Inducible Clindamycin NEGATIVE Sensitive     * MODERATE STAPHYLOCOCCUS AUREUS  Culture, blood (Routine X 2) w Reflex to ID Panel     Status: None   Collection Time: 04/02/19  3:21 AM    Specimen: BLOOD  Result Value Ref Range Status   Specimen Description BLOOD RIGHT ARM  Final   Special Requests   Final    BOTTLES DRAWN AEROBIC ONLY Blood Culture results may not be optimal  due to an inadequate volume of blood received in culture bottles   Culture   Final    NO GROWTH 5 DAYS Performed at [facility], [place] [address], [place], [place] [address]    Report Status 04/07/2019 FINAL  Final    Michel Bickers, MD [facility] [phone] pager   [phone] cell 04/07/2019, 2:58 PM

## 2019-04-07 NOTE — Progress Notes (Signed)
pr

## 2019-04-07 NOTE — Consult Note (Signed)
[address]       [place] [address]             [place] Record #[id number] Date of Birth: 04-21-1990  Referring: Dr. Harrell Gave, MD Primary Care: Patient, No Pcp Per  Chief Complaint:    Chief Complaint  Patient presents with  . Code Stroke   Reason for consultation:  Left hemisheric stroke - Endocarditis involving multiple heart valves culture negative   History of Present Illness:     This is a 29 year old AA female with admitted past history of drug abuse (marijuana, heroin) who presented to the ED with aphasia and right sided weakness on 04/01/2019.  According to medical records, she was at a local hotel and overstayed her visit. Police were summonsed. She was talking on the phone and was slurring her speech, developed a right facial droop, and had difficulty talking. EMS arrived and she was still able to communicate but progressive having worsening symptoms. She was transported to [facility] ED for further evaluation and treatment.  Code Stroke was called. Stat CT of the head showed leftward gaze deviation with mild hypodensity in the left insula and no hemorrhage or mass effect. CT angiogram of head and neck showed left MCA proximal M3 branch occlusion, multiple bilateral upper lung nodules (some with early cavitation), small infarct of left MCA territory. TPA was deferred as concern for septic emboli. She was admitted to the Neurology ICU.  On 04/01/2019, she underwent a left common carotid arteriogram followed by complete revascularization of occluded M3 region of inferior division of left MCA  With x 1 pass with solitairex 92m x 20 mm retriever and penumbra aspiration achieving a TICI 3 revascularization..  Placement of a 4.5 mm x 230mpipeline flow diverter at site of focal dissection associated with a small pseudoaneurysm of distal cervical LT ICA. 26F angioseal for hemostasis at the right CFA access  site. Patient remained intubated after the procedure. She had leukocytosis upon admission at 19,500. She also had fever to 101.4. She was put on Vancomycin and Zosyn initially. Of note, urine drug screen was positive for Amphetamines and Tetrahydrocannabinol. Critical care/pulmonary was consulted to assist with vent management.  Blood cultures show no growth and respiratory culture showed Staph Aureus. Patient was found to have a large hematoma extending into the labia on 03/25. There was a suspicion of large oozing pseudoaneurysm vs. active hemorrhage. Brillinta was held until P2Y12 results known. Pressure was held as patient was transported to CT. Patient had palpable right femoral, and right DP pulses.Results of CT showed large, diffuse hematoma involving the anterior right groin and upper right thigh. Hematoma does not extend into the intra-abdominal retroperitoneal structures.  Vascular [place] 03/25 showed no evidence of pseudoaneurysm or AVF, but did show acute DVT involving the right common femoral vein, but no evidence of DVT on the left. CT of chest showed mild cardiomegaly, multifocal nodularity in both lungs, largest areas in the superior segment of the right lower lobe   2 D echo was done on 04/02/2019 and results showed LVEF 55-60%, mild MR and TR and moderate AR. Zosyn was stopped and Cefepime was started on 03/25. Brillinta was restarted via tube on 03/25. It was ultimately decided not to anticoagulate her for the right CFV DVT but ask IR to place IVC, which was done on 03/26. She was put on a  Neo Synephrine drip for hypotension. Patient had decreased hemoglobin to 6.7 on 03/27. She was transfused,  GI was consulted (no sign of GI bleeding so EGD not recommended), and CT of abdomen and pelvis done. Results showed a small amount of hyperdense material around the right common femoral vein and arteries and within the subcutaneous fat likely reflecting a small amount of hemorrhagic fluid likely from  recent percutaneous interventions but no retroperitoneal hematoma is present. Wedge-shaped areas of hypodensity within the spleen, most concerning for splenic infarcts.  Somewhat striated areas of hypoenhancement throughout bilateral kidneys likely reflecting small areas of infarction.   Patient's clinical condition improved and she was extubated 04/04/2019. Cardiology did a TEE on 04/07/2019. Results showed severe aortic regurgitation, irregular leaflet structure suggestive of endocarditis, mild MR with small vegetation on atrial surface of A2-P2, and mild to moderate TR with large, mobile structure (grater than 2 cm in length) consistent with endocarditis. Cardiac Surgery consulted  has been consulted to determine if and when surgical intervention would be indicated .  Current Activity/ Functional Status: Patient was independent with mobility/ambulation, transfers, ADL's, IADL's.   Zubrod Score: At the time of surgery this patient's most appropriate activity status/level should be described as: []    0    Normal activity, no symptoms []    1    Restricted in physical strenuous activity but ambulatory, able to do out light work []    2    Ambulatory and capable of self care, unable to do work activities, up and about more than 50%  of the time                            []    3    Only limited self care, in bed greater than 50% of waking hours [x]    4    Completely disabled, no self care, confined to bed or chair []    5    Moribund  History reviewed. No pertinent past medical history. No pertinent past surgical history, according to the patient  Past Surgical History:  Procedure Laterality Date  . IR CT HEAD LTD  04/01/2019  . IR INTRAVSC STENT CERV CAROTID W/O EMB-PROT MOD SED INC ANGIO  04/01/2019  . IR IVC FILTER PLMT / S&I /IMG GUID/MOD SED  04/03/2019  . IR PERCUTANEOUS ART THROMBECTOMY/INFUSION INTRACRANIAL INC DIAG ANGIO  04/01/2019  . RADIOLOGY WITH ANESTHESIA N/A 04/01/2019    Procedure: IR WITH ANESTHESIA;  Surgeon: Luanne Bras, MD;  Location: [facility];  Service: Radiology;  Laterality: N/A;    Social History   Tobacco Use  Smoking Status Not on file    Social History   Substance and Sexual Activity  Alcohol Use None  Patient admits to marijuana and heroin use. She states she used heroin prior to arrival this ED admission  Allergies: No Known Allergies  Current Facility-Administered Medications  Medication Dose Route Frequency Provider Last Rate Last Admin  . 0.9 %  sodium chloride infusion (Manually program via Guardrails IV Fluids)   Intravenous Once Buford Dresser, MD      . 0.9 %  sodium chloride infusion (Manually program via Guardrails IV Fluids)   Intravenous Once Buford Dresser, MD      . 0.9 %  sodium chloride infusion   Intravenous Continuous Buford Dresser, MD   Stopped at 04/05/19 1241  . 0.9 %  sodium chloride infusion   Intravenous PRN Harrell Gave,  Bridgette, MD 10 mL/hr at 04/05/19 1300 Rate Verify at 04/05/19 1300  . 0.9 %  sodium chloride infusion   Intravenous Continuous Buford Dresser, MD 75 mL/hr at 04/07/19 1313 New Bag at 04/07/19 1313  . acetaminophen (TYLENOL) tablet 650 mg  650 mg Oral Q4H PRN Buford Dresser, MD       Or  . acetaminophen (TYLENOL) 160 MG/5ML solution 650 mg  650 mg Per Tube Q4H PRN Buford Dresser, MD       Or  . acetaminophen (TYLENOL) suppository 650 mg  650 mg Rectal Q4H PRN Buford Dresser, MD   650 mg at 04/02/19 2956  . aspirin chewable tablet 81 mg  81 mg Oral Daily Buford Dresser, MD   81 mg at 04/06/19 1017   Or  . aspirin chewable tablet 81 mg  81 mg Per Tube Daily Buford Dresser, MD   81 mg at 04/04/19 1214  . atorvastatin (LIPITOR) tablet 80 mg  80 mg Per Tube q1800 Buford Dresser, MD   80 mg at 04/06/19 1726  . ceFAZolin (ANCEF) IVPB 2g/100 mL premix  2 g Intravenous Q8H Buford Dresser, MD 200 mL/hr at 04/07/19  1345 2 g at 04/07/19 1345  . chlorhexidine gluconate (MEDLINE KIT) (PERIDEX) 0.12 % solution 15 mL  15 mL Mouth Rinse BID Buford Dresser, MD   15 mL at 04/06/19 2100  . Chlorhexidine Gluconate Cloth 2 % PADS 6 each  6 each Topical Daily Buford Dresser, MD   6 each at 04/07/19 1016  . feeding supplement (ENSURE ENLIVE) (ENSURE ENLIVE) liquid 237 mL  237 mL Oral TID BM Buford Dresser, MD   237 mL at 04/06/19 2100  . furosemide (LASIX) injection 20 mg  20 mg Intravenous Once Buford Dresser, MD      . ondansetron [facility]) injection 4 mg  4 mg Intravenous Q6H PRN Buford Dresser, MD      . potassium chloride 20 MEQ/15ML (10%) solution 40 mEq  40 mEq Oral Once Buford Dresser, MD      . senna-docusate (Senokot-S) tablet 1 tablet  1 tablet Oral QHS PRN Buford Dresser, MD      . sodium chloride flush (NS) 0.9 % injection 10-40 mL  10-40 mL Intracatheter Q12H Buford Dresser, MD   10 mL at 04/07/19 1017  . sodium chloride flush (NS) 0.9 % injection 10-40 mL  10-40 mL Intracatheter PRN Buford Dresser, MD      . sodium chloride flush (NS) 0.9 % injection 3 mL  3 mL Intravenous Once Buford Dresser, MD      . ticagrelor [facility]) tablet 90 mg  90 mg Oral BID Buford Dresser, MD   90 mg at 04/06/19 2254  . vitamin B-12 (CYANOCOBALAMIN) tablet 1,000 mcg  1,000 mcg Per Tube Daily Buford Dresser, MD   1,000 mcg at 04/04/19 2130   Or  . vitamin B-12 (CYANOCOBALAMIN) tablet 1,000 mcg  1,000 mcg Oral Daily Buford Dresser, MD   1,000 mcg at 04/06/19 1018    No medications prior to admission.   Per patient, her mother and father are alive and are missionaries currently in another country ([place]). According to medical record, she has not been in contact with parents since May 2020 as she was caught using drugs. She has a brother who lives in [place], [place] and a sister who lives in [place]. Patient is unemployed  and is not married.   Review of Systems:      Cardiac Review of Systems: Y or  [  N  ]= no  Chest Pain [ N   ]  Pedal Edema [ N  ]    Palpitations [  ] Syncope  Aqua.Slicker  ]   Presyncope [ N  ]  General Review of Systems: [Y] = yes [N  ]=no Constitional:   nausea [ N ]; night sweats [ N ]; fever [  ]; or chills [ N ]                                                               Dental: Last Dentist visit: Unsure but has been years Resp: cough [ N ];  wheezing[N  ];  hemoptysis[N  ]; shortness of breath[N  ];  GI:   vomiting[ N ];  dysphagia[ Y ]; melena[N  ];  hematochezia Aqua.Slicker  ];  GU:  hematuria[N  ];                Heme/Lymph:  bleeding[N  ];  anemia[ Y ];  Neuro:  stroke[ Y ];  vertigo[  N];  seizures[N  ];     Endocrine: diabetes[N  ];  thyroid dysfunction[  N];                Physical Exam: BP (!) 110/59 (BP Location: Left Arm)   Pulse 99   Temp 98.7 F (37.1 C) (Oral)   Resp (!) 22   Ht 5' 5" (1.651 m)   Wt 72.3 kg   LMP 02/05/2019 (Approximate) Comment: Neg Preg Test on 04/03/19  SpO2 100%   BMI 26.52 kg/m    General appearance: cooperative and no distress Head: Normocephalic, without obvious abnormality, atraumatic Neck: no carotid bruit and supple, symmetrical, trachea midline Resp: clear to auscultation bilaterally Cardio: Slightly tachyardic, Grade III/VI mumrur heard best along sternal border GI: Soft, non tender, bowel sounds present Extremities: No LE edema, feet warm bilaterally. Hematoma right groin Neurologic: She is alert to person, place. She has expressive aphasia and speaks a few words in short sentences  Diagnostic Studies & Laboratory data:     Recent Radiology Findings:   DG Swallowing Func-Speech Pathology  Result Date: 04/06/2019 Objective Swallowing Evaluation: Type of Study: MBS-Modified Barium Swallow Study  Patient Details Name: Lauren Reyes MRN: [id number] Date of Birth: 1990-09-05 Today's Date: 04/06/2019 Time: SLP Start Time (ACUTE ONLY):  6378 -SLP Stop Time (ACUTE ONLY): 0924 SLP Time Calculation (min) (ACUTE ONLY): 19 min Past Medical History: No past medical history on file. Past Surgical History: Past Surgical History: Procedure Laterality Date . IR CT HEAD LTD  04/01/2019 . IR INTRAVSC STENT CERV CAROTID W/O EMB-PROT MOD SED INC ANGIO  04/01/2019 . IR IVC FILTER PLMT / S&I /IMG GUID/MOD SED  04/03/2019 . IR PERCUTANEOUS ART THROMBECTOMY/INFUSION INTRACRANIAL INC DIAG ANGIO  04/01/2019 . RADIOLOGY WITH ANESTHESIA N/A 04/01/2019  Procedure: IR WITH ANESTHESIA;  Surgeon: Luanne Bras, MD;  Location: [facility];  Service: Radiology;  Laterality: N/A; HPI: Pt is a 29 yo female with suspected IVDU admitted with sudden onset R sided weakness and aphasia, now s/p revascularization by IR of L MCA. MRI showed numerous smaller acute infarcts scattered throughout the brain bilaterally. ETT 3/24-3/27.  Subjective: cooperative Assessment / Plan / Recommendation CHL IP CLINICAL IMPRESSIONS 04/06/2019 Clinical Impression Patient presents with  mild oropharyngeal dysphagia. Pt was noted with mild oral residue, but cleared with multiple swallows without any cues. Pharyngeal phase is remarkable for reduced laryngeal closure, resulting in penetration (PAS 2) with thin liquids. Pt was noted with coughing prior to and after administering POs, however, the coughing was delayed and seems unrelated to penetration. No aspiration was noted, despite challenging the pt with drinking POs consecutively. Following consecutive sips, pt was observed to have a sharp inhalation post-swallow, but no pharyngeal residue was noted, and her airway remained clear. Pt's cognitve status also seems to have greatly improved since previous session. Recommend starting a regular diet and thin liquids, ensuring the pt is following strict aspiration precuations. Will f/u briefly for tolerance and completion of speech-language eval. SLP Visit Diagnosis Dysphagia, oropharyngeal phase (R13.12) Attention  and concentration deficit following -- Frontal lobe and executive function deficit following -- Impact on safety and function Mild aspiration risk   CHL IP TREATMENT RECOMMENDATION 04/06/2019 Treatment Recommendations Therapy as outlined in treatment plan below   Prognosis 04/06/2019 Prognosis for Safe Diet Advancement Good Barriers to Reach Goals Cognitive deficits Barriers/Prognosis Comment -- CHL IP DIET RECOMMENDATION 04/06/2019 SLP Diet Recommendations Regular solids;Thin liquid Liquid Administration via Spoon;Cup;Straw Medication Administration Whole meds with liquid Compensations Minimize environmental distractions;Slow rate;Small sips/bites Postural Changes Seated upright at 90 degrees   CHL IP OTHER RECOMMENDATIONS 04/06/2019 Recommended Consults -- Oral Care Recommendations Oral care BID Other Recommendations --   CHL IP FOLLOW UP RECOMMENDATIONS 04/06/2019 Follow up Recommendations Inpatient Rehab   CHL IP FREQUENCY AND DURATION 04/06/2019 Speech Therapy Frequency (ACUTE ONLY) min 1 x/week Treatment Duration 1 week      CHL IP ORAL PHASE 04/06/2019 Oral Phase Impaired Oral - Pudding Teaspoon -- Oral - Pudding Cup -- Oral - Honey Teaspoon -- Oral - Honey Cup -- Oral - Nectar Teaspoon -- Oral - Nectar Cup -- Oral - Nectar Straw -- Oral - Thin Teaspoon Lingual/palatal residue Oral - Thin Cup Lingual/palatal residue Oral - Thin Straw Lingual/palatal residue Oral - Puree Lingual/palatal residue Oral - Mech Soft -- Oral - Regular Lingual/palatal residue Oral - Multi-Consistency -- Oral - Pill -- Oral Phase - Comment --  CHL IP PHARYNGEAL PHASE 04/06/2019 Pharyngeal Phase Impaired Pharyngeal- Pudding Teaspoon -- Pharyngeal -- Pharyngeal- Pudding Cup -- Pharyngeal -- Pharyngeal- Honey Teaspoon -- Pharyngeal -- Pharyngeal- Honey Cup -- Pharyngeal -- Pharyngeal- Nectar Teaspoon -- Pharyngeal -- Pharyngeal- Nectar Cup -- Pharyngeal -- Pharyngeal- Nectar Straw -- Pharyngeal -- Pharyngeal- Thin Teaspoon WFL Pharyngeal --  Pharyngeal- Thin Cup Penetration/Aspiration during swallow;Reduced airway/laryngeal closure Pharyngeal Material enters airway, remains ABOVE vocal cords then ejected out Pharyngeal- Thin Straw Reduced airway/laryngeal closure;Penetration/Aspiration during swallow Pharyngeal Material enters airway, remains ABOVE vocal cords then ejected out Pharyngeal- Puree WFL Pharyngeal -- Pharyngeal- Mechanical Soft -- Pharyngeal -- Pharyngeal- Regular WFL Pharyngeal -- Pharyngeal- Multi-consistency -- Pharyngeal -- Pharyngeal- Pill -- Pharyngeal -- Pharyngeal Comment --  CHL IP CERVICAL ESOPHAGEAL PHASE 04/06/2019 Cervical Esophageal Phase WFL Pudding Teaspoon -- Pudding Cup -- Honey Teaspoon -- Honey Cup -- Nectar Teaspoon -- Nectar Cup -- Nectar Straw -- Thin Teaspoon -- Thin Cup -- Thin Straw -- Puree -- Mechanical Soft -- Regular -- Multi-consistency -- Pill -- Cervical Esophageal Comment -- Osie Bond., M.A. [place] [facility] Pager [phone] Office [phone] 04/06/2019, 10:22 AM             CLINICAL DATA:  Abnormal x-ray, lung nodule follow-up.  EXAM: CT CHEST WITHOUT CONTRAST done 04/02/2019  TECHNIQUE: Multidetector CT  imaging of the chest was performed following the standard protocol without IV contrast.  COMPARISON:  None.  FINDINGS: Cardiovascular: Heart size mildly enlarged with low-attenuation cardiac chambers, no sign of pericardial effusion. Aorta is of normal caliber. Central pulmonary vasculature is normal on noncontrast imaging.  Mediastinum/Nodes: Endotracheal tube terminates in the mid trachea. Gastric tube in the stomach. Triangular marbled fatty and soft tissue density in the anterior mediastinum compatible with residual thymic tissue no sign of adenopathy  Lungs/Pleura: Multifocal nodularity in the chest. Largest areas in the superior segment of the right lower lobe.  (Image 66, series 4) 2 x 1.9 cm. Another smaller area on image 73 measuring 1.6  cm.  Similar smaller nodules showing more central soft tissue attenuation and ill-defined ground-glass margins scattered about the chest involving upper and lower lobes.  Small nodule in the left lung base (image 90, series 4) 10 mm.  (Image 46, series 4) superior segment left lower lobe 8 mm.  7 mm nodule in the lingula on image 92 of series 4.  Upper Abdomen: Heterogeneous appearance of the nephrogram bilaterally in the setting of recent angiography and thrombectomy.  Musculoskeletal: No acute bone finding or destructive bone process.  IMPRESSION: 1. Multifocal nodularity in both lungs, largest areas in the superior segment of the right lower lobe measuring 2 x 1.9 cm. Similar smaller nodules showing more central soft tissue attenuation and ill-defined ground-glass margins scattered about the chest. Differential diagnosis includes multifocal pneumonia and would include viral or atypical processes, even COVID-19 infection given the peripheral nature of many of these nodules. Septic emboli and metastatic disease are also considered, close follow-up is suggested. 2. Mild cardiomegaly. 3. Heterogeneous appearance of the nephrogram bilaterally in the setting of recent angiography and thrombectomy. Continued correlation with renal function is suggested. 4. Endotracheal tube terminates in the mid trachea. Gastric tube in the stomach. 5. Triangular marbled fatty and soft tissue density in the anterior mediastinum compatible with residual thymic tissue.   Electronically Signed   By: Zetta Bills M.D.   On: 04/02/2019 10:03  Portable CXR done 04/05/2019: CLINICAL DATA:  Hypoxia and respiratory failure  EXAM: PORTABLE CHEST 1 VIEW  COMPARISON:  April 03, 2019  FINDINGS: Endotracheal tube and nasogastric tube are stable. The heart size is enlarged. The mediastinal contour is normal. Increased pulmonary interstitium is identified bilaterally. Mild patchy opacity  is identified in the right mid lung. There is no pleural effusion. The bony structures are stable.  IMPRESSION: Congestive heart failure. Mild patchy opacity of right mid lung may be due to asymmetric pulmonary edema.   Electronically Signed   By: Abelardo Diesel M.D.   On: 04/04/2019 09:50  TRANSESOPHAGEAL ECHOCARDIOGRAM : 04/07/2019 LEFT VENTRICLE: EF = 55-60%.  No regional wall motion abnormalities.  RIGHT VENTRICLE: Normal size and function.   LEFT ATRIUM: No thrombus/mass.  LEFT ATRIAL APPENDAGE: No thrombus/mass.   RIGHT ATRIUM: No thrombus. Chiari network seen.  AORTIC VALVE:  Moderately thickened. Abnormal structure, appears functionally bicuspid vs. Tricuspid with partially fused leaflet. Irregular thickened leaflet structure suggestive of endocarditis. Severe aortic regurgitation, with the flow filling the entire LVOT.   MITRAL VALVE:    Echodense small vegetation seen on atrial surface at A2-P2 leaflet junction. Mild regurgitation. Consistent with endocarditis.  TRICUSPID VALVE: Large mobile echodense structure projecting into right atrium. Measures >2 cm in length. Consistent with endocarditis. Mild-moderate TR.  PULMONIC VALVE: Grossly normal structure. Trivial regurgitation. No apparent vegetation, but not completely visualized.  INTERATRIAL SEPTUM: No  PFO or ASD seen by color Doppler. Bubble study positive for late bubbles, suggestive of a non-cardiac shunt.  PERICARDIUM: No effusion noted.  DESCENDING AORTA: No significant plaque seen  I have independently reviewed the above radiologic studies and discussed with the patient   Recent Lab Findings: Lab Results  Component Value Date   WBC 21.5 (H) 04/07/2019   HGB 9.5 (L) 04/07/2019   HCT 28.4 (L) 04/07/2019   PLT 459 (H) 04/07/2019   GLUCOSE 87 04/05/2019   CHOL 163 04/02/2019   TRIG 258 (H) 04/02/2019   HDL <10 (L) 04/02/2019   LDLDIRECT 42.9 04/03/2019   LDLCALC NOT CALCULATED  04/02/2019   ALT 40 04/01/2019   AST 63 (H) 04/01/2019   NA 143 04/05/2019   K 3.4 (L) 04/05/2019   CL 110 04/05/2019   CREATININE 0.52 04/05/2019   BUN <5 (L) 04/05/2019   CO2 21 (L) 04/05/2019   TSH 0.879 04/02/2019   INR 1.5 (H) 04/01/2019   HGBA1C 5.9 (H) 04/02/2019    Assessment / Plan:   1. S/p left MCA stroke-s/p cerebral arteriogram, with emergent mechanical thrombectomy of left MCA M3 occlusion achieving a TICI 3 revascularization, stent to L cervical ICA over focal dissection small pseudoaneurysm. 2. Severe aortic regurgitation, irregular leaflet structure suggestive of endocarditis 3. Mild MR with small vegetation on atrial surface of A2-P2 4. Mild to moderate TR with large, mobile structure (grater than 2 cm in length) consistent with endocarditis. Regarding endocarditis, Dr. Servando Snare to evaluate to determine if surgical intervention ultimately required as well as timing. 5. ID -on Cefazolin for endocarditis. She was also on Vancomycin previously but this was stopped. Infectious disease has been consulted 6. History of IVDU-most likely source of stroke and endocarditis.  7. Multiple pulmonary nodules-possible septic emboli as well as possible splenic and kidney infarcts 8. Right common femoral vein DVT, involving the right CFV-s/p right IJ IVC by IR 03/26.  9. Acute respiratory failure resolved-extubated 03/28. Pulmonary/CCM following 10. Large, diffuse, right groin hematoma (no retroperitoneal hematoma present)-sheath removed post IR procedure, angio seal placed 11. Mild dysphagia-secondary to stroke.  Speech pathology following.  12. Likely pre diabetes-HGA1C 5.9. Per primary  Generally accepted indications for surgical treatment of endocarditis:  Valve abnormalities or regurgitation resulting in congestive heart failure Microorganisms that are not controlled by antimicrobial therapy (fungal) Endocarditis leading to valve dehiscence, perforation, rupture or fistula or  large perivalvular abscess, recurrent emboli Persistent vegetation or fever/bacteremia despite optimal treatment vegetations  that are mobile and larger then>10 mm in diameter on the mitral valve vegetations that are increasing in size despite antimicrobial therapy Mitral "kissing" vegetation  Patient does have significant aortic insufficiency-  filling  the outflow track at [facility] patient does not have evidence of perivalvular abscess or evidence of congestive heart failure.  She presented with emboli-to the kidney spleen and left hemisphere of the brain-there is no evidence of continued emboli after initiating antibiotic therapy as sometimes can be noted.  She has on CT scan of the chest what appears to be septic emboli to the lung from tricuspid vegetation.   Patient ultimately will likely require aortic valve replacement-however this is complicated by recent stroke and risk of intracranial hemorrhage with full anticoagulation.  With the injury to her internal carotid artery placement of the stent she is on aspirin and Brilinta currently.   We will continue without any antibiotic therapy at this point, consider surgery if heart failure symptoms worsen and when she is able to  be off Brilinta for 7 days.  Her IV drug use and plan for post hospital psychiatric treatment will need to be established before considering any cardiac surgery intervention.

## 2019-04-07 NOTE — Interval H&P Note (Signed)
History and Physical Interval Note:  04/07/2019 11:18 AM  Lauren Reyes  has presented today for surgery, with the diagnosis of STROKE.  The various methods of treatment have been discussed with the patient and family. After consideration of risks, benefits and other options for treatment, the patient has consented to  Procedure(s): TRANSESOPHAGEAL ECHOCARDIOGRAM (TEE) (N/A) as a surgical intervention.  The patient's history has been reviewed, patient examined, no change in status, stable for surgery.  I have reviewed the patient's chart and labs.  Questions were answered to the patient's satisfaction.     Teagyn Fishel Cristal Deer

## 2019-04-07 NOTE — Progress Notes (Signed)
Denture cup with apparent illegal substance that appeared to be crystal meth, and a crushed pink substance in another bag, was taken from patient's room and turned over to security.

## 2019-04-07 NOTE — Anesthesia Postprocedure Evaluation (Signed)
Anesthesia Post Note  Patient: Lauren Reyes  Procedure(s) Performed: TRANSESOPHAGEAL ECHOCARDIOGRAM (TEE) (N/A ) BUBBLE STUDY     Patient location during evaluation: PACU Anesthesia Type: General Level of consciousness: awake and alert Pain management: pain level controlled Vital Signs Assessment: post-procedure vital signs reviewed and stable Respiratory status: spontaneous breathing, nonlabored ventilation, respiratory function stable and patient connected to nasal cannula oxygen Cardiovascular status: blood pressure returned to baseline and stable Postop Assessment: no apparent nausea or vomiting Anesthetic complications: no    Last Vitals:  Vitals:   04/07/19 1210 04/07/19 1232  BP: (!) 109/47 (!) 110/59  Pulse:  99  Resp: (!) 38 (!) 22  Temp:  37.1 C  SpO2:  100%    Last Pain:  Vitals:   04/07/19 1232  TempSrc: Oral  PainSc:                  Kairos Panetta

## 2019-04-07 NOTE — Progress Notes (Signed)
   04/07/19 0604  Provider Notification  Provider Name/Title K.Kirby  Date Provider Notified 04/07/19  Time Provider Notified [phone]  Notification Type Page  Notification Reason Other (Comment) (found a crush med and a crystal in a sachet under her pillow)  Response Other (Comment) (waiting for response)

## 2019-04-07 NOTE — Anesthesia Preprocedure Evaluation (Addendum)
Anesthesia Evaluation  Patient identified by MRN, date of birth, ID band Patient awake and Patient confused    Reviewed: Allergy & Precautions, NPO status , Patient's Chart, lab work & pertinent test results  Airway Mallampati: II  TM Distance: >3 FB Neck ROM: Full    Dental  (+) Teeth Intact   Pulmonary neg pulmonary ROS,    breath sounds clear to auscultation       Cardiovascular + Valvular Problems/Murmurs AI  Rhythm:Regular Rate:Tachycardia  Left Ventricle: Left ventricular ejection fraction, by estimation, is 55  to 60%. The left ventricle has normal function. The left ventricle has no  regional wall motion abnormalities. The left ventricular internal cavity  size was normal in size. There is  no left ventricular hypertrophy. Left ventricular diastolic parameters  were normal.   Aortic Valve: The aortic valve is tricuspid. Aortic valve regurgitation is  moderate. Aortic regurgitation PHT measures 408 msec. Mild aortic valve  sclerosis is present, with no evidence of aortic valve stenosis.    Neuro/Psych CVA, Residual Symptoms negative psych ROS   GI/Hepatic negative GI ROS, (+)     substance abuse  marijuana use and methamphetamine use,   Endo/Other  negative endocrine ROS  Renal/GU negative Renal ROS  negative genitourinary   Musculoskeletal negative musculoskeletal ROS (+)   Abdominal   Peds negative pediatric ROS (+)  Hematology  (+) Blood dyscrasia, anemia ,   Anesthesia Other Findings   Reproductive/Obstetrics negative OB ROS                            Anesthesia Physical  Anesthesia Plan  ASA: IV  Anesthesia Plan: General   Post-op Pain Management:    Induction: Intravenous  PONV Risk Score and Plan: 3 and Ondansetron  Airway Management Planned: Simple Face Mask and Mask  Additional Equipment:   Intra-op Plan:   Post-operative Plan:   Informed Consent:    Plan Discussed with: CRNA, Anesthesiologist and Surgeon  Anesthesia Plan Comments:         Anesthesia Quick Evaluation

## 2019-04-07 NOTE — Progress Notes (Signed)
SLP Cancellation Note  Patient Details Name: Lauren Reyes MRN: [id number] DOB: 12/24/1990   Cancelled treatment:       Reason Eval/Treat Not Completed: Patient at procedure or test/unavailable Pt at TEE. Will continue efforts.   Cathi Roan 04/07/2019, 10:44 AM

## 2019-04-07 NOTE — Progress Notes (Signed)
Pharmacy Antibiotic Note  Lauren Reyes is a 29 y.o. female admitted on 04/01/2019 as a code stroke with aphasia and right-sided weakness. Pharmacy has been consulted for vancomycin dosing.  TEE on 3/30 revealed severe endocarditis involving aortic, mitral, and tricuspid valves with severe aortic regurgitation. Patient's condition is further complicated by septic emboli to her brain, lungs, and spleen. Respiratory culture grew MSSA, however, blood cultures are negative. Appears to have received at least one dose of cefazolin prior to culture draw, but will plan to treat with vancomycin and Unasyn to cover MSSA and other causes of culture-negative endocarditis.  Afebrile, WBC 21.5, Scr 0.52.   Plan: -Start vancomcyin 1500 mg IV x1, then 1000 mg IV q12h -Estimated AUC 473 -Goal AUC 400-550  Monitor renal function, clinical improvement, and vancomycin levels as clinically indicated.   Height: 5\' 5"  (165.1 cm) Weight: 159 lb 6.3 oz (72.3 kg) IBW/kg (Calculated) : 57  Temp (24hrs), Avg:98.4 F (36.9 C), Min:97.9 F (36.6 C), Max:98.9 F (37.2 C)  Recent Labs  Lab 04/02/19 0013 04/02/19 1223 04/03/19 0502 04/03/19 1355 04/04/19 0405 04/04/19 1543 04/05/19 0506 04/05/19 0916 04/06/19 0526 04/07/19 0500  WBC 21.4*   < > 25.6*   < > 29.5* 27.8*  --  29.5* 22.2* 21.5*  CREATININE 0.64  --  0.60  --  0.61 0.59 0.52  --   --   --    < > = values in this interval not displayed.    Estimated Creatinine Clearance: 102.4 mL/min (by C-G formula based on SCr of 0.52 mg/dL).    No Known Allergies  Antimicrobials this admission: Zosyn 3/25 >> 3/25 Cefepime 3/25 >> 3/27 Ancef 3/27 >> 3/30 Vanc 3/25 >> 3/27; restarted 3/30 >>  Unasyn 3/30 >>  Dose adjustments this admission: N/A  Microbiology results: 3/24 COVID/Flu >> neg 3/25 RCx >> moderate MSSA 3/25 MRSA PCR >> negative 3/25 BCx >> negative   Thank you for allowing pharmacy to be a part of this patient's  care.  4/25, PharmD Candidate  04/07/2019 3:19 PM

## 2019-04-07 NOTE — Progress Notes (Signed)
Inpatient Rehab Admissions:  Inpatient Rehab Consult received.  I met with patient at the bedside for rehabilitation assessment and to discuss goals and expectations of an inpatient rehab admission.  She's open to rehab.  Note plan for IV antibiotics to treat endocarditis.  Will need to confirm expected length of antibiotics, and confer with rehab MD, as CIR typically does not discharge pts on IV antibiotics with history of IVDU.   Signed:  , PT, DPT Admissions Coordinator [phone] 04/07/19  3:40 PM    

## 2019-04-07 NOTE — Anesthesia Procedure Notes (Signed)
Procedure Name: MAC Date/Time: 04/07/2019 11:33 AM Performed by: Orlie Dakin, CRNA Pre-anesthesia Checklist: Emergency Drugs available, Suction available, Patient being monitored and Patient identified Oxygen Delivery Method: Nasal cannula Preoxygenation: Pre-oxygenation with 100% oxygen Induction Type: IV induction Placement Confirmation: positive ETCO2

## 2019-04-07 NOTE — Progress Notes (Signed)
  Echocardiogram Echocardiogram Transesophageal has been performed.  Lauren Reyes 04/07/2019, 11:56 AM

## 2019-04-07 NOTE — Progress Notes (Signed)
STROKE TEAM PROGRESS NOTE   INTERVAL HISTORY Patient is lying comfortably in bed.  Patient passed swallow eval and will have modified barium swallow today.  Hematocrit is stable at 28.4 .  TEE done today shows severe endocarditis involving aortic, mitral and tricuspid valves with severe aortic regurg.  CT surgery and ID consults are pending  Vitals:   04/07/19 1149 04/07/19 1200 04/07/19 1210 04/07/19 1232  BP: (!) 110/50  (!) 109/47 (!) 110/59  Pulse: (!) 116 (!) 112  99  Resp: (!) 36 (!) 24 (!) 38 (!) 22  Temp: 98.8 F (37.1 C)   98.7 F (37.1 C)  TempSrc: Oral   Oral  SpO2: 100% 100%  100%  Weight:      Height:        CBC:  Recent Labs  Lab 04/06/19 0526 04/07/19 0500  WBC 22.2* 21.5*  NEUTROABS 17.1* 16.0*  HGB 8.6* 9.5*  HCT 25.4* 28.4*  MCV 80.1 79.8*  PLT 403* 459*    Basic Metabolic Panel:  Recent Labs  Lab 04/02/19 0013 04/02/19 0159 04/03/19 0502 04/04/19 0405 04/04/19 1543 04/04/19 1543 04/05/19 0506 04/05/19 1419  NA   < >  --  141   < > 144  --  143  --   K   < >  --  4.1   < > 3.6   < > 2.9* 3.4*  CL   < >  --  112*   < > 112*  --  110  --   CO2   < >  --  21*   < > 21*  --  21*  --   GLUCOSE   < >  --  104*   < > 101*  --  87  --   BUN   < >  --  10   < > 7  --  <5*  --   CREATININE   < >  --  0.60   < > 0.59  --  0.52  --   CALCIUM   < >  --  7.7*   < > 7.8*  --  7.9*  --   MG  --  1.9 2.0  --   --   --   --   --   PHOS  --   --  3.3  --   --   --   --   --    < > = values in this interval not displayed.   Lipid Panel:     Component Value Date/Time   CHOL 163 04/02/2019 0012   TRIG 258 (H) 04/02/2019 0012   HDL <10 (L) 04/02/2019 0012   CHOLHDL NOT CALCULATED 04/02/2019 0012   VLDL 52 (H) 04/02/2019 0012   LDLCALC NOT CALCULATED 04/02/2019 0012   HgbA1c:  Lab Results  Component Value Date   HGBA1C 5.9 (H) 04/02/2019   Urine Drug Screen:     Component Value Date/Time   LABOPIA NONE DETECTED 04/01/2019 2349   COCAINSCRNUR NONE  DETECTED 04/01/2019 2349   LABBENZ NONE DETECTED 04/01/2019 2349   AMPHETMU POSITIVE (A) 04/01/2019 2349   THCU POSITIVE (A) 04/01/2019 2349   LABBARB NONE DETECTED 04/01/2019 2349    Alcohol Level No results found for: ETH  IMAGING past 24h No results found.    PHYSICAL EXAM  Pleasant young African lady not in distress. . Afebrile. Head is nontraumatic. Neck is supple without bruit.    Cardiac exam no murmur or  gallop. Lungs are clear to auscultation. Distal pulses are well felt. Neurological Exam :  Awake alert oriented to place and person.  Expressive aphasia and speaks only a few words and short sentences.  Dysarthria improvingt.  Good comprehension.  Difficulty with naming and repetition.  Extraocular movements full range without nystagmus.  Moderate right lower facial weakness.  Tongue midline.  Motor system exam shows mild weakness of right grip intrinsic hand muscles and orbits left over right upper extremity.  Moves all 4 extremities well otherwise.  Gait not tested. ASSESSMENT/PLAN Lauren Reyes is a 29 y.o. female with unknown medical history, possible heroin abuse, presenting with aphasia and R sided weakness. tPA not given d/t multiple BUL lung nodules w/ concern for septic embolic. Found to have L M3 occlusion and sent to IR for mechanical thrombectomy.  Stroke:  left MCA infarct s/p IR L M3 occlusion w/ TICI3 revascularization, stent to L cervical ICA over focal dissection small pseudoaneurysm. 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 ASPECTS 8    CTA head & neck - L MCA M3 branch occlusion. Multiple B upper lung nodules, ? Septic emboli.   CT perfusion - 52mL core infarct w/ 48ml L MCA penumbra  Cerebral angio - L M3 occlusion s/p TICI3 revascularization. Pipeline sent placed at site of focal dissection associated w/ small pseudoaneurysm distal cervical L ICA.   CT  head - 3/24 repeat. L MCA contrast staining. Minimal L frontal lobe white matter infarct.   MRI - 04/02/19 - Small 2-3 cm confluent infarct at the Left Insula with Heidelberg Classification 1b petechial hemorrhage. But numerous smaller acute infarcts scattered throughout the brain.  Several other areas of similar petechial hemorrhage. This constellation remains suspicious for septic emboli. Suggestion also of trace subarachnoid hemorrhage.  MRA - negative for vessel occlusion. The left ICA stent is patent.  CXR - 04/04/19 - Congestive heart failure. Mild patchy opacity of right mid lung may be due to asymmetric pulmonary edema  LE Doppler - Right common femoral vein acute DVT  CT Abdomen and Pelvis - 04/04/19 - No retroperitoneal hematoma; however, concerning for possible renal and splenic infarcts.  2D Echo EF 55-60%, no vegetation seen  TEE to look for endocarditis tomorrow at 1130 pending    LDL NOT CALCULATED d/t TG 258. direct LDL - 42.9  HgbA1c 5.9  SARS - negative  HIV - non reactive  SCDs for VTE prophylaxis  unknown meds prior to admission, now on aspirin 81 mg daily and Brilinta (ticagrelor) 90 mg bid for stent.   P2Y12 - 115 - for antiplatelet dose adjustment by NIR  Therapy recommendations:  CIR  Disposition:  pending   Acute Respiratory Failure, resolved  Intubated for IR  left intubated for airway protection  Sedated  Weaned to extubation -> stable  Lung Nodules, possible septic emboli ? infective endocarditis Fever   CT chest B lung multifocal nodularity, largest superior RLL. Dif includes: PNA, COVID, septic emboli and mets. Mild cardiomegaly. Heterogenous B nephrogram. ET in mid trachea. GT in stomach. Tissue in mediastinum c/w residual thymic tissue.  WBC 19.5->21.4->26.4->25.6->29.5->22.2  TMax 100.5  No nuchal rigidity - less likely meningitis - not LP candidate given on brilinta   Zosyn 3/25>>3/25  Vanc 3/25>>3/27  Cefepime  3/25>>3/27  Cefazolin 3/27>>  Blood Cx no growth 4 days  Sputum Cx - MODERATE STAPHYLOCOCCUS AUREUS   DVT  LE venous doppler - right common femoral vein DVT  Not candidate for anticoagulation due to large right groin hematoma and possible endocarditis  IVC filter 3/27   Substance abuse, Possible IVDU Sinus Tachycardia, possible withdrawal, stable  UDS:  THC POSITIVE, Amphetamines POSITIVE  Suspicious for heroin use.   Multiple needle marks b/l UEs, L>R.   Treated w/ Fentanyl gtt in ICU  CIWA protocol  R groin hematoma  Sheath removed post IR w/ angioseal placed  New R groin hematoma on rounds  CT pelvis large diffuse hematoma anterior R groin and upper thigh NOT extending into intra-abd retroperitoneal structures. Limited eval for pseudoaneurysm  LE pseudoaneurysm [place] - No evidence of pseudoaneurysm, or AVF.   Hgb 11.2->12.2->9.3->8.6  Not candidate for anticoagulation at this time  Hypotension . BP goal per IR x 24h 120-140  . Treated w/ phenylephrine . BP stable  . Long-term BP goal normotensive  Possible Hyperlipidemia  LDL NOT CALCULATED d/t TG 751.   Direct LDL 42.9, at Goal LDL < 70  Now on Lipitor 80  Dysphagia, resolved Secondary to stroke Speech on board Cleared for diet  Elevated blood hcG  I-stat hcG 7.4  HCG beta chain - neg  Repeat HcG beta chain - <1  Urine pregnancy test - negative  B12 deficiency   B12 = 175  B12 supplement  Other Stroke Risk Factors  Substance abuse  Other Active Problems  Hyponatremia, resolved  Hospital day # 6  TEE shows endocarditis involving multiple valves with severe aortic regurg.  Plan CT surgery consult for discussion of surgical options and ID consult for optimization of antibiotics course.  Discussed with rehab coordinator hopefully transfer to inpatient rehab in the next few days.   Greater than 50% time during this 25-minute visit was spent on counseling and coordination of care  about embolic stroke and answering questions and discussion with care team Delia Heady, MD To contact Stroke Continuity provider, please refer to [email or web address]. After hours, contact General Neurology

## 2019-04-07 NOTE — Transfer of Care (Signed)
Immediate Anesthesia Transfer of Care Note  Patient: Lauren Reyes  Procedure(s) Performed: TRANSESOPHAGEAL ECHOCARDIOGRAM (TEE) (N/A ) BUBBLE STUDY  Patient Location: Endoscopy Unit  Anesthesia Type:MAC  Level of Consciousness: drowsy  Airway & Oxygen Therapy: Patient Spontanous Breathing and Patient connected to nasal cannula oxygen  Post-op Assessment: Report given to RN and Post -op Vital signs reviewed and stable  Post vital signs: Reviewed  Last Vitals:  Vitals Value Taken Time  BP 110/50 04/07/19 1150  Temp 37.1 C 04/07/19 1149  Pulse 111 04/07/19 1152  Resp 32 04/07/19 1152  SpO2 100 % 04/07/19 1152  Vitals shown include unvalidated device data.  Last Pain:  Vitals:   04/07/19 1149  TempSrc: Oral  PainSc: 0-No pain         Complications: No apparent anesthesia complications

## 2019-04-07 NOTE — CV Procedure (Signed)
    TRANSESOPHAGEAL ECHOCARDIOGRAM   NAME:  Lauren Reyes   MRN: [id number] DOB:  Dec 13, 1990   ADMIT DATE: 04/01/2019  INDICATIONS: Stroke, rule out endocarditis  PROCEDURE:   Informed consent was obtained prior to the procedure. The risks, benefits and alternatives for the procedure were discussed and the patient comprehended these risks.  Risks include, but are not limited to, cough, sore throat, vomiting, nausea, somnolence, esophageal and stomach trauma or perforation, bleeding, low blood pressure, aspiration, pneumonia, infection, trauma to the teeth and death.    Procedural time out performed. Patient received monitored anesthesia care under the supervision of Dr. Tacy Dura. She received 16 mcg of precedex, 60 mg lidocaine, 350 mg of propofol.  The transesophageal probe was inserted in the esophagus and stomach without difficulty and multiple views were obtained.    COMPLICATIONS:    There were no immediate complications.  FINDINGS:  LEFT VENTRICLE: EF = 55-60%.  No regional wall motion abnormalities.  RIGHT VENTRICLE: Normal size and function.   LEFT ATRIUM: No thrombus/mass.  LEFT ATRIAL APPENDAGE: No thrombus/mass.   RIGHT ATRIUM: No thrombus. Chiari network seen.  AORTIC VALVE:  Moderately thickened. Abnormal structure, appears functionally bicuspid vs. Tricuspid with partially fused leaflet. Irregular thickened leaflet structure suggestive of endocarditis. Severe aortic regurgitation, with the flow filling the entire LVOT.   MITRAL VALVE:    Echodense small vegetation seen on atrial surface at A2-P2 leaflet junction. Mild regurgitation. Consistent with endocarditis.  TRICUSPID VALVE: Large mobile echodense structure projecting into right atrium. Measures >2 cm in length. Consistent with endocarditis. Mild-moderate TR.  PULMONIC VALVE: Grossly normal structure. Trivial regurgitation. No apparent vegetation, but not completely visualized.  INTERATRIAL SEPTUM: No  PFO or ASD seen by color Doppler. Bubble study positive for late bubbles, suggestive of a non-cardiac shunt.  PERICARDIUM: No effusion noted.  DESCENDING AORTA: No significant plaque seen   CONCLUSION: Findings suggest endocarditis of the aortic, mitral, and tricuspid valves. Aortic regurgitation is severe. Tricuspid vegetation >2 cm. Would have CT surgery evaluate to see if she is a surgical candidate. No PFO seen, late positive bubble study suggests non-cardiac shunt.   Jodelle Red, MD, PhD [facility]  [address] [place], [place] [address] [phone]   11:42 AM

## 2019-04-07 NOTE — Progress Notes (Addendum)
5208 While bathing patient today, I found a crystal drug in a sachet placed inside a denture cup under her pillow. When asked the pt. About it, She stated "Don't worry about it" , I have Notified the Press photographer and Paged [place] PA. The  Drugs was given to the Charge Nurse.  Will Endorsed to day RN.

## 2019-04-07 NOTE — Progress Notes (Signed)
    [facility] has been requested to perform a transesophageal echocardiogram on Lauren Reyes for stroke.  After careful review of history and examination, the risks and benefits of transesophageal echocardiogram have been explained including risks of esophageal damage, perforation (1:10,000 risk), bleeding, pharyngeal hematoma as well as other potential complications associated with conscious sedation including aspiration, arrhythmia, respiratory failure and death. Alternatives to treatment were discussed, questions were answered. Spoke to patient's sister by phone who agreed with procedure.   Lindalou Soltis David Stall, PA-C  04/07/2019 9:00 AM

## 2019-04-07 NOTE — Progress Notes (Signed)
VAST RN spoke with pt's RN regarding MD request to remove TL CL. Pt's nurse verbalized she has never pulled a CL before; she will ask the unit charge nurse to assist her or place a new IV team consult for assistance. Pt is currently working with PT so it will be completed later in the day.

## 2019-04-07 NOTE — Progress Notes (Signed)
Patient at this time has a single lumen picc and needs more IV access due to incompatible IV antibiotics. Recommend keeping CVC temp, or recommend a PICC exchange for a double lumen. Patient is going to be in the hospital for a while. RN Junie Panning made aware of recommendations and MD Pearlean Brownie was secure messaged as well.

## 2019-04-07 NOTE — Progress Notes (Signed)
Physical Therapy Treatment Patient Details Name: Lauren Reyes MRN: [id number] DOB: 1990/01/18 Today's Date: 04/07/2019    History of Present Illness 29 year old female with suspected IV drug abuse who was being interviewed by police tonight and developed right-sided facial weakness, aphasia, and R weakness.  She was brought by EMS to the emergency room work-up revealed occluded left MCA. Revascularization performed by IR. CT head and neck demonstrate multiple small cavitary abscesses in the upper lobes bilaterally. IVC filter placement for RLE DVT on 3/26.    PT Comments    Pt supine in bed on arrival, she is once again soiled in urine but did request to use commode.  While sitting on commode she started to have BM.  HR elevated to 140s so deferred progression of gt.  Pt continues to benefit from skilled rehab in a post acute setting to address function, balance and coordination deficits.  Gt remains limited to to elevated HR.    Follow Up Recommendations  CIR;Supervision/Assistance - 24 hour     Equipment Recommendations  (defer to post acute setting.)    Recommendations for Other Services       Precautions / Restrictions Precautions Precautions: Fall;Other (comment) Precaution Comments: impulsive Restrictions Weight Bearing Restrictions: No    Mobility  Bed Mobility Overal bed mobility: Needs Assistance Bed Mobility: Supine to Sit     Supine to sit: Supervision     General bed mobility comments: Increased time and effort but no assistance to rise to standing.  Transfers Overall transfer level: Needs assistance Equipment used: None;1 person hand held assist Transfers: Sit to/from Stand Sit to Stand: Min assist         General transfer comment: Cues for hand placement to and from seated surface.  Ambulation/Gait Ambulation/Gait assistance: Min assist;+2 safety/equipment Gait Distance (Feet): 6 Feet Assistive device: Rolling walker (2 wheeled) Gait  Pattern/deviations: Step-through pattern;Trunk flexed     General Gait Details: Pt required assistance to move from bed to commode to recliner.  She required hand held assistance and VCs.   Stairs             Wheelchair Mobility    Modified Rankin (Stroke Patients Only) Modified Rankin (Stroke Patients Only) Pre-Morbid Rankin Score: No symptoms Modified Rankin: Moderately severe disability     Balance Overall balance assessment: Needs assistance Sitting-balance support: Feet supported Sitting balance-Leahy Scale: Fair       Standing balance-Leahy Scale: Poor                              Cognition Arousal/Alertness: Awake/alert Behavior During Therapy: Flat affect Overall Cognitive Status: Impaired/Different from baseline Area of Impairment: Following commands;Safety/judgement                       Following Commands: Follows one step commands with increased time Safety/Judgement: Decreased awareness of safety;Decreased awareness of deficits     General Comments: Pt remains to be impulsive this session.  She was able to voice that her socks were dirty and she wanted a new pair.      Exercises      General Comments        Pertinent Vitals/Pain Pain Assessment: No/denies pain Faces Pain Scale: No hurt Pain Location: generalized Pain Descriptors / Indicators: Restless Pain Intervention(s): Monitored during session;Repositioned    Home Living  Prior Function            PT Goals (current goals can now be found in the care plan section) Acute Rehab PT Goals Patient Stated Goal: Unable to participate in goal setting. Potential to Achieve Goals: Good Progress towards PT goals: Progressing toward goals    Frequency    Min 4X/week      PT Plan Current plan remains appropriate    Co-evaluation              AM-PAC PT "6 Clicks" Mobility   Outcome Measure  Help needed turning from your  back to your side while in a flat bed without using bedrails?: A Little Help needed moving from lying on your back to sitting on the side of a flat bed without using bedrails?: A Little Help needed moving to and from a bed to a chair (including a wheelchair)?: A Little Help needed standing up from a chair using your arms (e.g., wheelchair or bedside chair)?: A Little Help needed to walk in hospital room?: A Little Help needed climbing 3-5 steps with a railing? : A Little 6 Click Score: 18    End of Session Equipment Utilized During Treatment: Gait belt Activity Tolerance: Patient tolerated treatment well Patient left: in chair;with call bell/phone within reach;with chair alarm set Nurse Communication: Mobility status PT Visit Diagnosis: Muscle weakness (generalized) (M62.81);Other symptoms and signs involving the nervous system (R29.898)     Time: 8756-4332 PT Time Calculation (min) (ACUTE ONLY): 14 min  Charges:  $Therapeutic Activity: 8-22 mins                     Bonney Leitz , PTA Acute Rehabilitation Services Pager [phone] Office [phone]     Carmon Sahli Artis Delay 04/07/2019, 10:14 AM

## 2019-04-08 DIAGNOSIS — I63412 Cerebral infarction due to embolism of left middle cerebral artery: Secondary | ICD-10-CM

## 2019-04-08 DIAGNOSIS — Z95828 Presence of other vascular implants and grafts: Secondary | ICD-10-CM

## 2019-04-08 LAB — CBC WITH DIFFERENTIAL/PLATELET
Abs Immature Granulocytes: 0.39 10*3/uL — ABNORMAL HIGH (ref 0.00–0.07)
Basophils Absolute: 0.1 10*3/uL (ref 0.0–0.1)
Basophils Relative: 0 %
Eosinophils Absolute: 0.1 10*3/uL (ref 0.0–0.5)
Eosinophils Relative: 1 %
HCT: 27.9 % — ABNORMAL LOW (ref 36.0–46.0)
Hemoglobin: 9.2 g/dL — ABNORMAL LOW (ref 12.0–15.0)
Immature Granulocytes: 2 %
Lymphocytes Relative: 14 %
Lymphs Abs: 2.5 10*3/uL (ref 0.7–4.0)
MCH: 26.7 pg (ref 26.0–34.0)
MCHC: 33 g/dL (ref 30.0–36.0)
MCV: 81.1 fL (ref 80.0–100.0)
Monocytes Absolute: 0.9 10*3/uL (ref 0.1–1.0)
Monocytes Relative: 5 %
Neutro Abs: 14.7 10*3/uL — ABNORMAL HIGH (ref 1.7–7.7)
Neutrophils Relative %: 78 %
Platelets: 478 10*3/uL — ABNORMAL HIGH (ref 150–400)
RBC: 3.44 MIL/uL — ABNORMAL LOW (ref 3.87–5.11)
RDW: 16.4 % — ABNORMAL HIGH (ref 11.5–15.5)
WBC: 18.7 10*3/uL — ABNORMAL HIGH (ref 4.0–10.5)
nRBC: 0 % (ref 0.0–0.2)

## 2019-04-08 LAB — BASIC METABOLIC PANEL
Anion gap: 9 (ref 5–15)
BUN: 5 mg/dL — ABNORMAL LOW (ref 6–20)
CO2: 24 mmol/L (ref 22–32)
Calcium: 7.7 mg/dL — ABNORMAL LOW (ref 8.9–10.3)
Chloride: 104 mmol/L (ref 98–111)
Creatinine, Ser: 0.49 mg/dL (ref 0.44–1.00)
GFR calc Af Amer: >60 mL/min (ref >60)
GFR calc non Af Amer: >60 mL/min (ref >60)
Glucose, Bld: 105 mg/dL — ABNORMAL HIGH (ref 70–99)
Potassium: 3 mmol/L — ABNORMAL LOW (ref 3.5–5.1)
Sodium: 137 mmol/L (ref 135–145)

## 2019-04-08 LAB — HEPATITIS C ANTIBODY: HCV Ab: REACTIVE — AB

## 2019-04-08 MED ORDER — SODIUM CHLORIDE 0.9 % IV SOLN
2.0000 g | Freq: Two times a day (BID) | INTRAVENOUS | Status: DC
  Administered 2019-04-08 – 2019-06-08 (×123): 2 g via INTRAVENOUS
  Filled 2019-04-08 (×12): qty 2
  Filled 2019-04-08: qty 20
  Filled 2019-04-08: qty 2
  Filled 2019-04-08: qty 20
  Filled 2019-04-08 (×15): qty 2
  Filled 2019-04-08: qty 20
  Filled 2019-04-08 (×3): qty 2
  Filled 2019-04-08: qty 20
  Filled 2019-04-08 (×2): qty 2
  Filled 2019-04-08: qty 20
  Filled 2019-04-08 (×4): qty 2
  Filled 2019-04-08: qty 20
  Filled 2019-04-08: qty 2
  Filled 2019-04-08: qty 20
  Filled 2019-04-08 (×16): qty 2
  Filled 2019-04-08: qty 20
  Filled 2019-04-08 (×4): qty 2
  Filled 2019-04-08: qty 20
  Filled 2019-04-08 (×3): qty 2
  Filled 2019-04-08: qty 20
  Filled 2019-04-08: qty 2
  Filled 2019-04-08: qty 20
  Filled 2019-04-08 (×11): qty 2
  Filled 2019-04-08: qty 20
  Filled 2019-04-08 (×4): qty 2
  Filled 2019-04-08: qty 20
  Filled 2019-04-08: qty 2
  Filled 2019-04-08: qty 20
  Filled 2019-04-08 (×2): qty 2
  Filled 2019-04-08: qty 20
  Filled 2019-04-08 (×3): qty 2
  Filled 2019-04-08: qty 20
  Filled 2019-04-08 (×12): qty 2
  Filled 2019-04-08: qty 20
  Filled 2019-04-08 (×2): qty 2
  Filled 2019-04-08 (×2): qty 20
  Filled 2019-04-08 (×6): qty 2
  Filled 2019-04-08 (×3): qty 20
  Filled 2019-04-08: qty 2
  Filled 2019-04-08: qty 20
  Filled 2019-04-08 (×6): qty 2

## 2019-04-08 NOTE — Progress Notes (Signed)
Patient ID: Lauren Reyes, female   DOB: 05-14-1990, 29 y.o.   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  We have decided to change ampicillin sulbactam to high-dose ceftriaxone since she almost certainly has some early cerebritis associated with her embolic stroke.  This should also help [place] avoid having to exchange her single-lumen PICC to a double-lumen PICC.  PLAN: 1. Continue vancomycin 2. Change ampicillin sulbactam to ceftriaxone 3. I will call and speak with her sister  Active Problems:   Infective endocarditis   Acute ischemic left MCA stroke ([place])   Middle cerebral artery embolism, left   Acute respiratory failure (HCC)   DVT (deep venous thrombosis) ([place])   Encounter for central line placement   Endotracheal tube present   Cerebrovascular accident (CVA) due to embolism of precerebral artery (HCC)   Normocytic anemia   Polysubstance abuse ([place])   IVDU (intravenous drug user)   Scheduled Meds: . sodium chloride   Intravenous Once  . sodium chloride   Intravenous Once  . aspirin  81 mg Oral Daily   Or  . aspirin  81 mg Per Tube Daily  . atorvastatin  80 mg Per Tube q1800  . chlorhexidine gluconate (MEDLINE KIT)  15 mL Mouth Rinse BID  . Chlorhexidine Gluconate Cloth  6 each Topical Daily  . feeding supplement (ENSURE ENLIVE)  237 mL Oral TID BM  . furosemide  20 mg Intravenous Once  . potassium chloride  40 mEq Oral Once  . sodium chloride flush  10-40 mL Intracatheter Q12H  . sodium chloride flush  3 mL Intravenous Once  . ticagrelor  90 mg Oral BID  . vitamin B-12  1,000 mcg Per Tube Daily   Or  . vitamin B-12  1,000 mcg Oral Daily   Continuous Infusions: . sodium  chloride Stopped (04/05/19 1241)  . sodium chloride 10 mL/hr at 04/05/19 1300  . sodium chloride 75 mL/hr at 04/08/19 0641  . cefTRIAXone (ROCEPHIN)  IV    . vancomycin 200 mL/hr at 04/08/19 0600   PRN Meds:.sodium chloride, acetaminophen **OR** acetaminophen (TYLENOL) oral liquid 160 mg/5 mL **OR** acetaminophen, ondansetron (ZOFRAN) IV, senna-docusate, sodium chloride flush   SUBJECTIVE: She is feeling better today.  She wants me to call and speak with her sister, Lauren Reyes.  Review of Systems: Review of Systems  Constitutional: Negative for fever.    No Known Allergies  OBJECTIVE: Vitals:   04/07/19 2359 04/08/19 0516 04/08/19 0739 04/08/19 1120  BP:  103/69 121/68 (!) 110/97  Pulse: (!) 106 (!) 101 (!) 108 86  Resp: _0 Temp:  99.2 F (37.3 C) 98.6 F (37 C) 98.4 F (36.9 C)  TempSrc:  Oral Oral Oral  SpO2: 100% 100% 100% 100%  Weight:      Height:       Body mass index is 26.52 kg/m.  Physical Exam Constitutional:      Comments: She is much more alert and in better spirits.  Neurological:     Comments: Her aphasia is much improved and her speech is much clearer.  Lab Results Lab Results  Component Value Date   WBC 18.7 (H) 04/08/2019   HGB 9.2 (L) 04/08/2019   HCT 27.9 (L) 04/08/2019   MCV 81.1 04/08/2019   PLT 478 (H) 04/08/2019    Lab Results  Component Value Date   CREATININE 0.49 04/08/2019   BUN 5 (L) 04/08/2019   NA 137 04/08/2019   K 3.0 (L) 04/08/2019   CL 104 04/08/2019   CO2 24 04/08/2019    Lab Results  Component Value Date   ALT 40 04/01/2019   AST 63 (H) 04/01/2019   ALKPHOS 112 04/01/2019   BILITOT 0.9 04/01/2019     Microbiology: Recent Results (from the past 240 hour(s))  Respiratory Panel by RT PCR (Flu A&B, Covid) - Nasopharyngeal Swab     Status: None   Collection Time: 04/01/19  8:26 PM   Specimen: Nasopharyngeal Swab  Result Value Ref Range Status   SARS Coronavirus 2 by RT PCR NEGATIVE NEGATIVE Final     Comment: (NOTE) SARS-CoV-2 target nucleic acids are NOT DETECTED. The SARS-CoV-2 RNA is generally detectable in upper respiratoy specimens during the acute phase of infection. The lowest concentration of SARS-CoV-2 viral copies this assay can detect is 131 copies/mL. A negative result does not preclude SARS-Cov-2 infection and should not be used as the sole basis for treatment or other patient management decisions. A negative result may occur with  improper specimen collection/handling, submission of specimen other than nasopharyngeal swab, presence of viral mutation(s) within the areas targeted by this assay, and inadequate number of viral copies (<131 copies/mL). A negative result must be combined with clinical observations, patient history, and epidemiological information. The expected result is Negative. Fact Sheet for Patients:  PinkCheek.be Fact Sheet for Healthcare Providers:  GravelBags.it This test is not yet ap proved or cleared by the [place] FDA and  has been authorized for detection and/or diagnosis of SARS-CoV-2 by FDA under an Emergency Use Authorization (EUA). This EUA will remain  in effect (meaning this test can be used) for the duration of the COVID-19 declaration under Section 564(b)(1) of the Act, 21 U.S.C. section 360bbb-3(b)(1), unless the authorization is terminated or revoked sooner.    Influenza A by PCR NEGATIVE NEGATIVE Final   Influenza B by PCR NEGATIVE NEGATIVE Final    Comment: (NOTE) The Xpert Xpress SARS-CoV-2/FLU/RSV assay is intended as an aid in  the diagnosis of influenza from Nasopharyngeal swab specimens and  should not be used as a sole basis for treatment. Nasal washings and  aspirates are unacceptable for Xpert Xpress SARS-CoV-2/FLU/RSV  testing. Fact Sheet for Patients: PinkCheek.be Fact Sheet for Healthcare  Providers: GravelBags.it This test is not yet approved or cleared by the [place] FDA and  has been authorized for detection and/or diagnosis of SARS-CoV-2 by  FDA under an Emergency Use Authorization (EUA). This EUA will remain  in effect (meaning this test can be used) for the duration of the  Covid-19 declaration under Section 564(b)(1) of the Act, 21  U.S.C. section 360bbb-3(b)(1), unless the authorization is  terminated or revoked. Performed at [facility], [place] [address] [place], [place] [address]   MRSA PCR Screening     Status: None   Collection Time: 04/02/19 12:01 AM   Specimen: Nasopharyngeal  Result Value Ref Range Status   MRSA by PCR NEGATIVE NEGATIVE Final    Comment:        The GeneXpert MRSA Assay (FDA approved for NASAL specimens only), is one  component of a comprehensive MRSA colonization surveillance program. It is not intended to diagnose MRSA infection nor to guide or monitor treatment for MRSA infections. Performed at [facility], [place] [address] [place], [place] [address]   Culture, blood (Routine X 2) w Reflex to ID Panel     Status: None   Collection Time: 04/02/19 12:13 AM   Specimen: BLOOD RIGHT HAND  Result Value Ref Range Status   Specimen Description BLOOD RIGHT HAND  Final   Special Requests   Final    BOTTLES DRAWN AEROBIC AND ANAEROBIC Blood Culture adequate volume   Culture   Final    NO GROWTH 5 DAYS Performed at [facility], [place] [address], [place], [place] [address]    Report Status 04/07/2019 FINAL  Final  Culture, respiratory (non-expectorated)     Status: None   Collection Time: 04/02/19  2:50 AM   Specimen: Tracheal Aspirate; Respiratory  Result Value Ref Range Status   Specimen Description TRACHEAL ASPIRATE  Final   Special Requests NONE  Final   Gram Stain   Final    RARE WBC PRESENT, PREDOMINANTLY MONONUCLEAR RARE GRAM POSITIVE COCCI IN CLUSTERS Performed at [facility], [place] [address], [place], [place] [address]    Culture MODERATE STAPHYLOCOCCUS AUREUS  Final   Report Status 04/04/2019 FINAL  Final   Organism ID, Bacteria STAPHYLOCOCCUS AUREUS  Final      Susceptibility   Staphylococcus aureus - MIC*    CIPROFLOXACIN <=0.5 SENSITIVE Sensitive     ERYTHROMYCIN <=0.25 SENSITIVE Sensitive     GENTAMICIN <=0.5 SENSITIVE Sensitive     OXACILLIN 0.5 SENSITIVE Sensitive     TETRACYCLINE <=1 SENSITIVE Sensitive     VANCOMYCIN 1 SENSITIVE Sensitive     TRIMETH/SULFA <=10 SENSITIVE Sensitive     CLINDAMYCIN <=0.25 SENSITIVE Sensitive     RIFAMPIN <=0.5 SENSITIVE Sensitive     Inducible Clindamycin NEGATIVE Sensitive     * MODERATE STAPHYLOCOCCUS AUREUS  Culture, blood (Routine X 2) w Reflex to ID Panel     Status: None   Collection Time: 04/02/19  3:21 AM   Specimen: BLOOD  Result Value Ref Range Status   Specimen Description BLOOD RIGHT ARM  Final   Special Requests   Final    BOTTLES DRAWN AEROBIC ONLY Blood Culture results may not be optimal due to an inadequate volume of blood received in culture bottles   Culture   Final    NO GROWTH 5 DAYS Performed at [facility], [place] [address] [place], [place] [address]    Report Status 04/07/2019 FINAL  Final    Michel Bickers, MD [facility] for Infectious [place] [facility] [phone] pager   [phone] cell 04/08/2019, 12:06 PM

## 2019-04-08 NOTE — Progress Notes (Signed)
Instructed patient regarding high fall risk and instructed not to get OOB independently but to call staff for assistance. Verbalized understanding. Family member at bedside and acknowledged instruction also.

## 2019-04-08 NOTE — Progress Notes (Signed)
STROKE TEAM PROGRESS NOTE   INTERVAL HISTORY Patient is lying comfortably in bed.  Patient appears much improved today and speech is clear and facial weakness is also improved.  She remains slightly tachycardic and hematocrit is stable at 27.9.  Hepatitis C antibody is positive.Cardiovascular surgeon Dr. Tyrone Sage has evaluated patient and recommends elective aortic valve replacement when medically stable Vitals:   04/07/19 2359 04/08/19 0516 04/08/19 0739 04/08/19 1120  BP:  103/69 121/68 (!) 110/97  Pulse: (!) 106 (!) 101 (!) 108 86  Resp: 16 18 18 18   Temp:  99.2 F (37.3 C) 98.6 F (37 C) 98.4 F (36.9 C)  TempSrc:  Oral Oral Oral  SpO2: 100% 100% 100% 100%  Weight:      Height:        CBC:  Recent Labs  Lab 04/07/19 0500 04/08/19 0530  WBC 21.5* 18.7*  NEUTROABS 16.0* 14.7*  HGB 9.5* 9.2*  HCT 28.4* 27.9*  MCV 79.8* 81.1  PLT 459* 478*    Basic Metabolic Panel:  Recent Labs  Lab 04/02/19 0013 04/02/19 0159 04/03/19 0502 04/04/19 0405 04/05/19 0506 04/05/19 0506 04/05/19 1419 04/08/19 0530  NA   < >  --  141   < > 143  --   --  137  K   < >  --  4.1   < > 2.9*   < > 3.4* 3.0*  CL   < >  --  112*   < > 110  --   --  104  CO2   < >  --  21*   < > 21*  --   --  24  GLUCOSE   < >  --  104*   < > 87  --   --  105*  BUN   < >  --  10   < > <5*  --   --  5*  CREATININE   < >  --  0.60   < > 0.52  --   --  0.49  CALCIUM   < >  --  7.7*   < > 7.9*  --   --  7.7*  MG  --  1.9 2.0  --   --   --   --   --   PHOS  --   --  3.3  --   --   --   --   --    < > = values in this interval not displayed.   Lipid Panel:     Component Value Date/Time   CHOL 163 04/02/2019 0012   TRIG 258 (H) 04/02/2019 0012   HDL <10 (L) 04/02/2019 0012   CHOLHDL NOT CALCULATED 04/02/2019 0012   VLDL 52 (H) 04/02/2019 0012   LDLCALC NOT CALCULATED 04/02/2019 0012   HgbA1c:  Lab Results  Component Value Date   HGBA1C 5.9 (H) 04/02/2019   Urine Drug Screen:     Component Value  Date/Time   LABOPIA NONE DETECTED 04/01/2019 2349   COCAINSCRNUR NONE DETECTED 04/01/2019 2349   LABBENZ NONE DETECTED 04/01/2019 2349   AMPHETMU POSITIVE (A) 04/01/2019 2349   THCU POSITIVE (A) 04/01/2019 2349   LABBARB NONE DETECTED 04/01/2019 2349    Alcohol Level No results found for: ETH  IMAGING past 24h No results found.    PHYSICAL EXAM  Pleasant young African lady not in distress. . Afebrile. Head is nontraumatic. Neck is supple without bruit.    Cardiac exam no murmur  or gallop. Lungs are clear to auscultation. Distal pulses are well felt. Neurological Exam :  Awake alert oriented to place and person.  Mild Expressive aphasia and speaks only a few words and short sentences.  Dysarthria improving.  Good comprehension.  Difficulty with naming and repetition.  Extraocular movements full range without nystagmus.  Mild right lower facial weakness.  Tongue midline.  Motor system exam shows mild weakness of right grip intrinsic hand muscles and orbits left over right upper extremity.  Moves all 4 extremities well otherwise.  Gait not tested. ASSESSMENT/PLAN Lauren Reyes is a 29 y.o. female with unknown medical history, possible heroin abuse, presenting with aphasia and R sided weakness. tPA not given d/t multiple BUL lung nodules w/ concern for septic embolic. Found to have L M3 occlusion and sent to IR for mechanical thrombectomy.  Stroke:  left MCA infarct s/p IR L M3 occlusion w/ TICI3 revascularization, stent to L cervical ICA over focal dissection small pseudoaneurysm. 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 ASPECTS 8    CTA head & neck - L MCA M3 branch occlusion. Multiple B upper lung nodules, ? Septic emboli.   CT perfusion - 55mL core infarct w/ 47ml L MCA penumbra  Cerebral angio - L M3 occlusion s/p TICI3 revascularization. Pipeline sent placed at site of focal  dissection associated w/ small pseudoaneurysm distal cervical L ICA.   CT head - 3/24 repeat. L MCA contrast staining. Minimal L frontal lobe white matter infarct.   MRI - 04/02/19 - Small 2-3 cm confluent infarct at the Left Insula with Heidelberg Classification 1b petechial hemorrhage. But numerous smaller acute infarcts scattered throughout the brain.  Several other areas of similar petechial hemorrhage. This constellation remains suspicious for septic emboli. Suggestion also of trace subarachnoid hemorrhage.  MRA - negative for vessel occlusion. The left ICA stent is patent.  CXR - 04/04/19 - Congestive heart failure. Mild patchy opacity of right mid lung may be due to asymmetric pulmonary edema  LE Doppler - Right common femoral vein acute DVT  CT Abdomen and Pelvis - 04/04/19 - No retroperitoneal hematoma; however, concerning for possible renal and splenic infarcts.  2D Echo EF 55-60%, no vegetation seen  TEE shows aortic, tricuspid and mitral valve endocarditis with significant aortic regurg  LDL NOT CALCULATED d/t TG 258. direct LDL - 42.9  HgbA1c 5.9  SARS - negative  HIV - non reactive  SCDs for VTE prophylaxis  unknown meds prior to admission, now on aspirin 81 mg daily and Brilinta (ticagrelor) 90 mg bid for stent.   P2Y12 - 115 - for antiplatelet dose adjustment by NIR  Therapy recommendations:  CIR  Disposition:  pending   Acute Respiratory Failure, resolved  Intubated for IR  left intubated for airway protection  Sedated  Weaned to extubation -> stable  Lung Nodules, possible septic emboli ? infective endocarditis Fever   CT chest B lung multifocal nodularity, largest superior RLL. Dif includes: PNA, COVID, septic emboli and mets. Mild cardiomegaly. Heterogenous B nephrogram. ET in mid trachea. GT in stomach. Tissue in mediastinum c/w residual thymic tissue.  WBC 19.5->21.4->26.4->25.6->29.5->22.2  TMax 100.5  No nuchal rigidity - less likely  meningitis - not LP candidate given on brilinta   Zosyn 3/25>>3/25  Vanc 3/25>>3/27  Cefepime 3/25>>3/27  Cefazolin 3/27>>  Blood Cx no growth 4 days  Sputum Cx - MODERATE STAPHYLOCOCCUS AUREUS  DVT  LE venous doppler - right common femoral vein DVT  Not candidate for anticoagulation due to large right groin hematoma and possible endocarditis  IVC filter 3/27   Substance abuse, Possible IVDU Sinus Tachycardia, possible withdrawal, stable  UDS:  THC POSITIVE, Amphetamines POSITIVE  Suspicious for heroin use.   Multiple needle marks b/l UEs, L>R.   Treated w/ Fentanyl gtt in ICU  CIWA protocol  R groin hematoma  Sheath removed post IR w/ angioseal placed  New R groin hematoma on rounds  CT pelvis large diffuse hematoma anterior R groin and upper thigh NOT extending into intra-abd retroperitoneal structures. Limited eval for pseudoaneurysm  LE pseudoaneurysm [place] - No evidence of pseudoaneurysm, or AVF.   Hgb 11.2->12.2->9.3->8.6  Not candidate for anticoagulation at this time  Hypotension . BP goal per IR x 24h 120-140  . Treated w/ phenylephrine . BP stable  . Long-term BP goal normotensive  Possible Hyperlipidemia  LDL NOT CALCULATED d/t TG 256.   Direct LDL 42.9, at Goal LDL < 70  Now on Lipitor 80  Dysphagia, resolved Secondary to stroke Speech on board Cleared for diet  Elevated blood hcG  I-stat hcG 7.4  HCG beta chain - neg  Repeat HcG beta chain - <1  Urine pregnancy test - negative  B12 deficiency   B12 = 175  B12 supplement  Other Stroke Risk Factors  Substance abuse  Other Active Problems  Hyponatremia, resolved  Hospital day # 7  Appreciate CVTS and infectious disease help.  Plan is to change ampicillin/sulbactam to ceftriaxone for better CNS penetration..  Discussed with rehab coordinator and Dr. Hermelinda Medicus rehab MD hopefully transfer to inpatient rehab in the next few days.   Greater than 50% time during this  25-minute visit was spent on counseling and coordination of care about embolic stroke and answering questions and discussion with care team Delia Heady, MD To contact Stroke Continuity provider, please refer to [email or web address]. After hours, contact General Neurology

## 2019-04-08 NOTE — Progress Notes (Signed)
Physical Therapy Treatment Patient Details Name: Lauren Reyes MRN: [id number] DOB: 1990-12-26 Today's Date: 04/08/2019    History of Present Illness 29 year old female with suspected IV drug abuse who was being interviewed by police tonight and developed right-sided facial weakness, aphasia, and R weakness.  She was brought by EMS to the emergency room work-up revealed occluded left MCA. Revascularization performed by IR. CT head and neck demonstrate multiple small cavitary abscesses in the upper lobes bilaterally. IVC filter placement for RLE DVT on 3/26.    PT Comments    Pt supine in bed on arrival.  She required assistance to mobilize with min to min guard assistance.  Plan remains for post acute rehab as she remains limited to progress due to elevate HR.  She also presents with poor balance during dynamic gt activities.  Will continue to follow and update recommendations as needed.      Follow Up Recommendations  CIR;Supervision/Assistance - 24 hour     Equipment Recommendations  (defer to post acute.)    Recommendations for Other Services       Precautions / Restrictions Precautions Precautions: Fall;Other (comment) Precaution Comments: impulsive Restrictions Weight Bearing Restrictions: No    Mobility  Bed Mobility Overal bed mobility: Needs Assistance Bed Mobility: Supine to Sit     Supine to sit: Supervision     General bed mobility comments: No assistance needed to move to edge of bed.  Transfers Overall transfer level: Needs assistance Equipment used: None Transfers: Sit to/from Stand Sit to Stand: Min guard         General transfer comment: Cues for hand placement to and from seated surface.  Ambulation/Gait Ambulation/Gait assistance: Min guard Gait Distance (Feet): 10 Feet(x2 + 16 ft) Assistive device: Rolling walker (2 wheeled) Gait Pattern/deviations: Step-through pattern;Staggering right;Staggering left;Decreased stride length      General Gait Details: Pt with unsteady gt but remains limited due to elevated HR greater than 140 bpm.  Required seated breaks inbetween trials.   Stairs             Wheelchair Mobility    Modified Rankin (Stroke Patients Only) Modified Rankin (Stroke Patients Only) Pre-Morbid Rankin Score: No symptoms Modified Rankin: Moderately severe disability     Balance Overall balance assessment: Needs assistance Sitting-balance support: Feet supported Sitting balance-Leahy Scale: Fair       Standing balance-Leahy Scale: Poor Standing balance comment: external assistance to maintain standing during dynamic activities.                            Cognition Arousal/Alertness: Awake/alert Behavior During Therapy: Flat affect Overall Cognitive Status: Impaired/Different from baseline Area of Impairment: Following commands;Safety/judgement                       Following Commands: Follows one step commands with increased time Safety/Judgement: Decreased awareness of safety;Decreased awareness of deficits     General Comments: Pt continues to be impulsive      Exercises      General Comments        Pertinent Vitals/Pain Pain Assessment: Faces Faces Pain Scale: No hurt    Home Living                      Prior Function            PT Goals (current goals can now be found in the care plan section) Acute Rehab  PT Goals Patient Stated Goal: Unable to participate in goal setting. PT Goal Formulation: With family Potential to Achieve Goals: Good Progress towards PT goals: Progressing toward goals    Frequency    Min 4X/week      PT Plan Current plan remains appropriate    Co-evaluation              AM-PAC PT "6 Clicks" Mobility   Outcome Measure  Help needed turning from your back to your side while in a flat bed without using bedrails?: A Little Help needed moving from lying on your back to sitting on the side of a  flat bed without using bedrails?: A Little Help needed moving to and from a bed to a chair (including a wheelchair)?: A Little Help needed standing up from a chair using your arms (e.g., wheelchair or bedside chair)?: A Little Help needed to walk in hospital room?: A Little Help needed climbing 3-5 steps with a railing? : A Little 6 Click Score: 18    End of Session Equipment Utilized During Treatment: Gait belt Activity Tolerance: Patient tolerated treatment well Patient left: in chair;with call bell/phone within reach;with chair alarm set(sitter alarm belt applied for safety.) Nurse Communication: Mobility status PT Visit Diagnosis: Muscle weakness (generalized) (M62.81);Other symptoms and signs involving the nervous system (R29.898)     Time: 4765-4650 PT Time Calculation (min) (ACUTE ONLY): 26 min  Charges:  $Gait Training: 8-22 mins $Therapeutic Activity: 8-22 mins                     Erasmo Leventhal , PTA Acute Rehabilitation Services Pager [phone] Office [phone]     Traci Gafford Eli Hose 04/08/2019, 12:33 PM

## 2019-04-09 ENCOUNTER — Encounter (HOSPITAL_COMMUNITY): Payer: Self-pay | Admitting: Neurology

## 2019-04-09 DIAGNOSIS — F1190 Opioid use, unspecified, uncomplicated: Secondary | ICD-10-CM

## 2019-04-09 DIAGNOSIS — M25041 Hemarthrosis, right hand: Secondary | ICD-10-CM

## 2019-04-09 DIAGNOSIS — G0490 Encephalitis and encephalomyelitis, unspecified: Secondary | ICD-10-CM

## 2019-04-09 DIAGNOSIS — I6310 Cerebral infarction due to embolism of unspecified precerebral artery: Secondary | ICD-10-CM

## 2019-04-09 LAB — CBC WITH DIFFERENTIAL/PLATELET
Abs Immature Granulocytes: 0.27 10*3/uL — ABNORMAL HIGH (ref 0.00–0.07)
Basophils Absolute: 0.1 10*3/uL (ref 0.0–0.1)
Basophils Relative: 0 %
Eosinophils Absolute: 0.1 10*3/uL (ref 0.0–0.5)
Eosinophils Relative: 1 %
HCT: 28.1 % — ABNORMAL LOW (ref 36.0–46.0)
Hemoglobin: 9.1 g/dL — ABNORMAL LOW (ref 12.0–15.0)
Immature Granulocytes: 2 %
Lymphocytes Relative: 16 %
Lymphs Abs: 2.6 10*3/uL (ref 0.7–4.0)
MCH: 26.6 pg (ref 26.0–34.0)
MCHC: 32.4 g/dL (ref 30.0–36.0)
MCV: 82.2 fL (ref 80.0–100.0)
Monocytes Absolute: 0.7 10*3/uL (ref 0.1–1.0)
Monocytes Relative: 4 %
Neutro Abs: 12.6 10*3/uL — ABNORMAL HIGH (ref 1.7–7.7)
Neutrophils Relative %: 77 %
Platelets: 493 10*3/uL — ABNORMAL HIGH (ref 150–400)
RBC: 3.42 MIL/uL — ABNORMAL LOW (ref 3.87–5.11)
RDW: 17.4 % — ABNORMAL HIGH (ref 11.5–15.5)
WBC: 16.3 10*3/uL — ABNORMAL HIGH (ref 4.0–10.5)
nRBC: 0.1 % (ref 0.0–0.2)

## 2019-04-09 LAB — HCV RNA QUANT RFLX ULTRA OR GENOTYP
HCV RNA Qnt(log copy/mL): UNDETERMINED log10 IU/mL
HepC Qn: NOT DETECTED IU/mL

## 2019-04-09 LAB — VANCOMYCIN, PEAK: Vancomycin Pk: 27 ug/mL — ABNORMAL LOW (ref 30–40)

## 2019-04-09 NOTE — Progress Notes (Signed)
Inpatient Rehab Admissions Coordinator:   Discussed case with rehab medical director, Dr. Riley Kill.  Feel that because pt is currently mobilizing at min assist/min guard she will not require the intensity of our rehab program. Would recommend f/u at a lower level of care. CIR will sign off at this time.   Estill Dooms, PT, DPT Admissions Coordinator [phone] 04/09/19  9:53 AM

## 2019-04-09 NOTE — Progress Notes (Signed)
CBC drawn from brown port of TLC and sent to lab.

## 2019-04-09 NOTE — Progress Notes (Addendum)
Patient ID: Lauren Reyes, female   DOB: 1990/05/08, 29 y.o.   MRN: [id number]         [facility]  Date of Admission:  04/01/2019   Total days of antibiotics 9         ASSESSMENT: He is improving on therapy for culture-negative endocarditis complicated by CVA and early cerebritis.  Given her active injecting drug use she will need to remain in a supervised setting for all of her treatment.  PLAN: 1. Continue vancomycin and ceftriaxone for 6 weeks  Active Problems:   Infective endocarditis   Acute ischemic left MCA stroke ([place])   Middle cerebral artery embolism, left   Acute respiratory failure (HCC)   DVT (deep venous thrombosis) ([place])   Encounter for central line placement   Endotracheal tube present   Cerebrovascular accident (CVA) due to embolism of precerebral artery (HCC)   Normocytic anemia   Polysubstance abuse ([place])   IVDU (intravenous drug user)   Scheduled Meds: . sodium chloride   Intravenous Once  . sodium chloride   Intravenous Once  . aspirin  81 mg Oral Daily   Or  . aspirin  81 mg Per Tube Daily  . atorvastatin  80 mg Per Tube q1800  . chlorhexidine gluconate (MEDLINE KIT)  15 mL Mouth Rinse BID  . Chlorhexidine Gluconate Cloth  6 each Topical Daily  . feeding supplement (ENSURE ENLIVE)  237 mL Oral TID BM  . furosemide  20 mg Intravenous Once  . potassium chloride  40 mEq Oral Once  . sodium chloride flush  10-40 mL Intracatheter Q12H  . sodium chloride flush  3 mL Intravenous Once  . ticagrelor  90 mg Oral BID  . vitamin B-12  1,000 mcg Per Tube Daily   Or  . vitamin B-12  1,000 mcg Oral Daily   Continuous Infusions: . sodium chloride Stopped (04/05/19 1824)  . sodium chloride Stopped (04/06/19 0609)  . sodium chloride 75 mL/hr at 04/08/19 2220  . cefTRIAXone (ROCEPHIN)  IV 2 g (04/09/19 0920)  . vancomycin 1,000 mg (04/09/19 0623)   PRN Meds:.sodium chloride, acetaminophen **OR** acetaminophen (TYLENOL) oral  liquid 160 mg/5 mL **OR** acetaminophen, ondansetron (ZOFRAN) IV, senna-docusate, sodium chloride flush   SUBJECTIVE: She is feeling better.  She does admit to recent injecting drug use with heroin.  She recalls injecting shortly before she had her stroke.  Review of Systems: Review of Systems  Unable to perform ROS: Mental acuity    No Known Allergies  OBJECTIVE: Vitals:   04/09/19 0021 04/09/19 0337 04/09/19 0827 04/09/19 1302  BP: 108/69 (!) 124/57 (!) 113/51 129/64  Pulse: (!) 114 (!) 105 (!) 111 (!) 108  Resp: (!) '22 20  18  ' Temp: 98.9 F (37.2 C) 98.3 F (36.8 C) 97.7 F (36.5 C) 98.7 F (37.1 C)  TempSrc: Oral Oral Oral Oral  SpO2: 99% 100% 98% 100%  Weight:      Height:       Body mass index is 26.52 kg/m.  Physical Exam Constitutional:      Comments: She is alert and appears comfortable sitting up in bed.  Cardiovascular:     Rate and Rhythm: Normal rate and regular rhythm.     Heart sounds: No murmur.  Pulmonary:     Effort: Pulmonary effort is normal.     Breath sounds: Normal breath sounds.  Skin:    Comments: She has track marks on the back of her hands.  She has a splinter hemorrhage on her right long finger.  Neurological:     Comments: Her right-sided weakness has improved.  Her speech is much more fluent.  Psychiatric:        Mood and Affect: Mood normal.     Lab Results Lab Results  Component Value Date   WBC 16.3 (H) 04/09/2019   HGB 9.1 (L) 04/09/2019   HCT 28.1 (L) 04/09/2019   MCV 82.2 04/09/2019   PLT 493 (H) 04/09/2019    Lab Results  Component Value Date   CREATININE 0.49 04/08/2019   BUN 5 (L) 04/08/2019   NA 137 04/08/2019   K 3.0 (L) 04/08/2019   CL 104 04/08/2019   CO2 24 04/08/2019    Lab Results  Component Value Date   ALT 40 04/01/2019   AST 63 (H) 04/01/2019   ALKPHOS 112 04/01/2019   BILITOT 0.9 04/01/2019     Microbiology: Recent Results (from the past 240 hour(s))  Respiratory Panel by RT PCR (Flu A&B,  Covid) - Nasopharyngeal Swab     Status: None   Collection Time: 04/01/19  8:26 PM   Specimen: Nasopharyngeal Swab  Result Value Ref Range Status   SARS Coronavirus 2 by RT PCR NEGATIVE NEGATIVE Final    Comment: (NOTE) SARS-CoV-2 target nucleic acids are NOT DETECTED. The SARS-CoV-2 RNA is generally detectable in upper respiratoy specimens during the acute phase of infection. The lowest concentration of SARS-CoV-2 viral copies this assay can detect is 131 copies/mL. A negative result does not preclude SARS-Cov-2 infection and should not be used as the sole basis for treatment or other patient management decisions. A negative result may occur with  improper specimen collection/handling, submission of specimen other than nasopharyngeal swab, presence of viral mutation(s) within the areas targeted by this assay, and inadequate number of viral copies (<131 copies/mL). A negative result must be combined with clinical observations, patient history, and epidemiological information. The expected result is Negative. Fact Sheet for Patients:  PinkCheek.be Fact Sheet for Healthcare Providers:  GravelBags.it This test is not yet ap proved or cleared by the [place] FDA and  has been authorized for detection and/or diagnosis of SARS-CoV-2 by FDA under an Emergency Use Authorization (EUA). This EUA will remain  in effect (meaning this test can be used) for the duration of the COVID-19 declaration under Section 564(b)(1) of the Act, 21 U.S.C. section 360bbb-3(b)(1), unless the authorization is terminated or revoked sooner.    Influenza A by PCR NEGATIVE NEGATIVE Final   Influenza B by PCR NEGATIVE NEGATIVE Final    Comment: (NOTE) The Xpert Xpress SARS-CoV-2/FLU/RSV assay is intended as an aid in  the diagnosis of influenza from Nasopharyngeal swab specimens and  should not be used as a sole basis for treatment. Nasal washings and    aspirates are unacceptable for Xpert Xpress SARS-CoV-2/FLU/RSV  testing. Fact Sheet for Patients: PinkCheek.be Fact Sheet for Healthcare Providers: GravelBags.it This test is not yet approved or cleared by the [place] FDA and  has been authorized for detection and/or diagnosis of SARS-CoV-2 by  FDA under an Emergency Use Authorization (EUA). This EUA will remain  in effect (meaning this test can be used) for the duration of the  Covid-19 declaration under Section 564(b)(1) of the Act, 21  U.S.C. section 360bbb-3(b)(1), unless the authorization is  terminated or revoked. Performed at [facility], [place] [address] [place], [place] [address]   MRSA PCR Screening     Status: None  Collection Time: 04/02/19 12:01 AM   Specimen: Nasopharyngeal  Result Value Ref Range Status   MRSA by PCR NEGATIVE NEGATIVE Final    Comment:        The GeneXpert MRSA Assay (FDA approved for NASAL specimens only), is one component of a comprehensive MRSA colonization surveillance program. It is not intended to diagnose MRSA infection nor to guide or monitor treatment for MRSA infections. Performed at [facility], [place] [address] [place], [place] [address]   Culture, blood (Routine X 2) w Reflex to ID Panel     Status: None   Collection Time: 04/02/19 12:13 AM   Specimen: BLOOD RIGHT HAND  Result Value Ref Range Status   Specimen Description BLOOD RIGHT HAND  Final   Special Requests   Final    BOTTLES DRAWN AEROBIC AND ANAEROBIC Blood Culture adequate volume   Culture   Final    NO GROWTH 5 DAYS Performed at [facility], [place] [address], [place], [place] [address]    Report Status 04/07/2019 FINAL  Final  Culture, respiratory (non-expectorated)     Status: None   Collection Time: 04/02/19  2:50 AM   Specimen: Tracheal Aspirate; Respiratory  Result Value Ref Range Status   Specimen Description TRACHEAL ASPIRATE   Final   Special Requests NONE  Final   Gram Stain   Final    RARE WBC PRESENT, PREDOMINANTLY MONONUCLEAR RARE GRAM POSITIVE COCCI IN CLUSTERS Performed at [facility], [place] [address], [place], [place] [address]    Culture MODERATE STAPHYLOCOCCUS AUREUS  Final   Report Status 04/04/2019 FINAL  Final   Organism ID, Bacteria STAPHYLOCOCCUS AUREUS  Final      Susceptibility   Staphylococcus aureus - MIC*    CIPROFLOXACIN <=0.5 SENSITIVE Sensitive     ERYTHROMYCIN <=0.25 SENSITIVE Sensitive     GENTAMICIN <=0.5 SENSITIVE Sensitive     OXACILLIN 0.5 SENSITIVE Sensitive     TETRACYCLINE <=1 SENSITIVE Sensitive     VANCOMYCIN 1 SENSITIVE Sensitive     TRIMETH/SULFA <=10 SENSITIVE Sensitive     CLINDAMYCIN <=0.25 SENSITIVE Sensitive     RIFAMPIN <=0.5 SENSITIVE Sensitive     Inducible Clindamycin NEGATIVE Sensitive     * MODERATE STAPHYLOCOCCUS AUREUS  Culture, blood (Routine X 2) w Reflex to ID Panel     Status: None   Collection Time: 04/02/19  3:21 AM   Specimen: BLOOD  Result Value Ref Range Status   Specimen Description BLOOD RIGHT ARM  Final   Special Requests   Final    BOTTLES DRAWN AEROBIC ONLY Blood Culture results may not be optimal due to an inadequate volume of blood received in culture bottles   Culture   Final    NO GROWTH 5 DAYS Performed at [facility], [place] [address] [place], [place] [address]    Report Status 04/07/2019 FINAL  Final    Michel Bickers, MD [facility] [phone] pager   [phone] cell 04/09/2019, 2:48 PM

## 2019-04-09 NOTE — Progress Notes (Signed)
STROKE TEAM PROGRESS NOTE   INTERVAL HISTORY Patient is lying comfortably in bed.  Patient appears much improved today and speech is clear and facial weakness is also improved.  Marland Kitchen Physical therapist now recommend home health PT OT.  Rehab team has signed off stating patient is too low level now to go to inpatient rehab.  ID team feel patient needs at least 6 weeks of vancomycin and ceftriaxone and will need a PICC line. Vitals:   04/09/19 0021 04/09/19 0337 04/09/19 0827 04/09/19 1302  BP: 108/69 (!) 124/57 (!) 113/51 129/64  Pulse: (!) 114 (!) 105 (!) 111 (!) 108  Resp: (!) 22 20  18   Temp: 98.9 F (37.2 C) 98.3 F (36.8 C) 97.7 F (36.5 C) 98.7 F (37.1 C)  TempSrc: Oral Oral Oral Oral  SpO2: 99% 100% 98% 100%  Weight:      Height:        CBC:  Recent Labs  Lab 04/08/19 0530 04/09/19 0535  WBC 18.7* 16.3*  NEUTROABS 14.7* 12.6*  HGB 9.2* 9.1*  HCT 27.9* 28.1*  MCV 81.1 82.2  PLT 478* 493*    Basic Metabolic Panel:  Recent Labs  Lab 04/03/19 0502 04/04/19 0405 04/05/19 0506 04/05/19 0506 04/05/19 1419 04/08/19 0530  NA 141   < > 143  --   --  137  K 4.1   < > 2.9*   < > 3.4* 3.0*  CL 112*   < > 110  --   --  104  CO2 21*   < > 21*  --   --  24  GLUCOSE 104*   < > 87  --   --  105*  BUN 10   < > <5*  --   --  5*  CREATININE 0.60   < > 0.52  --   --  0.49  CALCIUM 7.7*   < > 7.9*  --   --  7.7*  MG 2.0  --   --   --   --   --   PHOS 3.3  --   --   --   --   --    < > = values in this interval not displayed.   Lipid Panel:     Component Value Date/Time   CHOL 163 04/02/2019 0012   TRIG 258 (H) 04/02/2019 0012   HDL <10 (L) 04/02/2019 0012   CHOLHDL NOT CALCULATED 04/02/2019 0012   VLDL 52 (H) 04/02/2019 0012   LDLCALC NOT CALCULATED 04/02/2019 0012   HgbA1c:  Lab Results  Component Value Date   HGBA1C 5.9 (H) 04/02/2019   Urine Drug Screen:     Component Value Date/Time   LABOPIA NONE DETECTED 04/01/2019 2349   COCAINSCRNUR NONE DETECTED  04/01/2019 2349   LABBENZ NONE DETECTED 04/01/2019 2349   AMPHETMU POSITIVE (A) 04/01/2019 2349   THCU POSITIVE (A) 04/01/2019 2349   LABBARB NONE DETECTED 04/01/2019 2349    Alcohol Level No results found for: ETH  IMAGING past 24h No results found.    PHYSICAL EXAM  Pleasant young African lady not in distress. . Afebrile. Head is nontraumatic. Neck is supple without bruit.    Cardiac exam no murmur or gallop. Lungs are clear to auscultation. Distal pulses are well felt. Neurological Exam :  Awake alert oriented to place and person.  Mild Expressive aphasia and speaks only a few words and short sentences.  Dysarthria improved.  Good comprehension.  Difficulty with naming and repetition.  Extraocular movements full range without nystagmus.  Mild right lower facial weakness.  Tongue midline.  Motor system exam shows mild weakness of right grip intrinsic hand muscles and orbits left over right upper extremity.  Moves all 4 extremities well otherwise.  Gait not tested. ASSESSMENT/PLAN [name] is a 29 y.o. female with unknown medical history, possible heroin abuse, presenting with aphasia and R sided weakness. tPA not given d/t multiple BUL lung nodules w/ concern for septic embolic. Found to have L M3 occlusion and sent to IR for mechanical thrombectomy.  Stroke:  left MCA infarct s/p IR L M3 occlusion w/ TICI3 revascularization, stent to L cervical ICA over focal dissection small pseudoaneurysm. 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 ASPECTS 8    CTA head & neck - L MCA M3 branch occlusion. Multiple B upper lung nodules, ? Septic emboli.   CT perfusion - 4mL core infarct w/ 60ml L MCA penumbra  Cerebral angio - L M3 occlusion s/p TICI3 revascularization. Pipeline sent placed at site of focal dissection associated w/ small pseudoaneurysm distal cervical L ICA.   CT head - 3/24  repeat. L MCA contrast staining. Minimal L frontal lobe white matter infarct.   MRI - 04/02/19 - Small 2-3 cm confluent infarct at the Left Insula with Heidelberg Classification 1b petechial hemorrhage. But numerous smaller acute infarcts scattered throughout the brain.  Several other areas of similar petechial hemorrhage. This constellation remains suspicious for septic emboli. Suggestion also of trace subarachnoid hemorrhage.  MRA - negative for vessel occlusion. The left ICA stent is patent.  CXR - 04/04/19 - Congestive heart failure. Mild patchy opacity of right mid lung may be due to asymmetric pulmonary edema  LE Doppler - Right common femoral vein acute DVT  CT Abdomen and Pelvis - 04/04/19 - No retroperitoneal hematoma; however, concerning for possible renal and splenic infarcts.  2D Echo EF 55-60%, no vegetation seen  TEE shows aortic, tricuspid and mitral valve endocarditis with significant aortic regurg  LDL NOT CALCULATED d/t TG 809. direct LDL - 42.9  HgbA1c 5.9  SARS - negative  HIV - non reactive  SCDs for VTE prophylaxis  unknown meds prior to admission, now on aspirin 81 mg daily and Brilinta (ticagrelor) 90 mg bid for stent.   P2Y12 - 115 - for antiplatelet dose adjustment by NIR  Therapy recommendations:  CIR  Disposition:  pending   Acute Respiratory Failure, resolved  Intubated for IR  left intubated for airway protection  Sedated  Weaned to extubation -> stable  Lung Nodules, possible septic emboli ? infective endocarditis Fever   CT chest B lung multifocal nodularity, largest superior RLL. Dif includes: PNA, COVID, septic emboli and mets. Mild cardiomegaly. Heterogenous B nephrogram. ET in mid trachea. GT in stomach. Tissue in mediastinum c/w residual thymic tissue.  WBC 19.5->21.4->26.4->25.6->29.5->22.2  TMax 100.5  No nuchal rigidity - less likely meningitis - not LP candidate given on brilinta   Zosyn 3/25>>3/25  Vanc  3/25>>3/27  Cefepime 3/25>>3/27  Cefazolin 3/27>>  Blood Cx no growth 4 days  Sputum Cx - MODERATE STAPHYLOCOCCUS AUREUS   DVT  LE venous doppler - right common femoral vein DVT  Not candidate for anticoagulation due to large right groin hematoma and possible endocarditis  IVC filter 3/27   Substance abuse, Possible IVDU Sinus Tachycardia, possible withdrawal, stable  UDS:  THC POSITIVE, Amphetamines POSITIVE  Suspicious for heroin use.   Multiple needle marks b/l UEs, L>R.   Treated w/ Fentanyl gtt in ICU  CIWA protocol  R groin hematoma  Sheath removed post IR w/ angioseal placed  New R groin hematoma on rounds  CT pelvis large diffuse hematoma anterior R groin and upper thigh NOT extending into intra-abd retroperitoneal structures. Limited eval for pseudoaneurysm  LE pseudoaneurysm [place] - No evidence of pseudoaneurysm, or AVF.   Hgb 11.2->12.2->9.3->8.6  Not candidate for anticoagulation at this time  Hypotension . BP goal per IR x 24h 120-140  . Treated w/ phenylephrine . BP stable  . Long-term BP goal normotensive  Possible Hyperlipidemia  LDL NOT CALCULATED d/t TG 355.   Direct LDL 42.9, at Goal LDL < 70  Now on Lipitor 80  Dysphagia, resolved Secondary to stroke Speech on board Cleared for diet  Elevated blood hcG  I-stat hcG 7.4  HCG beta chain - neg  Repeat HcG beta chain - <1  Urine pregnancy test - negative  B12 deficiency   B12 = 175  B12 supplement  Other Stroke Risk Factors  Substance abuse  Other Active Problems  Hyponatremia, resolved  Hospital day # 8  Appreciate CVTS and infectious disease help.  Plan is to treat endocarditis with 6 weeks of vancomycin and ceftriaxone.  However patient cannot be discharged home with a PICC line due to concerns about her IV drug abuse history and she will be difficult placement in skilled nursing facility due to her young age..  Discussed with rehab coordinator and Dr. Orvan Falconer r   Greater than 50% time during this 25-minute visit was spent on counseling and coordination of care about embolic stroke and answering questions and discussion with care team Delia Heady, MD To contact Stroke Continuity provider, please refer to [email or web address]. After hours, contact General Neurology

## 2019-04-09 NOTE — Progress Notes (Signed)
Physical Therapy Treatment Patient Details Name: Lauren Reyes MRN: [id number] DOB: Jun 12, 1990 Today's Date: 04/09/2019    History of Present Illness 29 year old female with suspected IV drug abuse who was being interviewed by police tonight and developed right-sided facial weakness, aphasia, and R weakness.  She was brought by EMS to the emergency room work-up revealed occluded left MCA. Revascularization performed by IR. CT head and neck demonstrate multiple small cavitary abscesses in the upper lobes bilaterally. IVC filter placement for RLE DVT on 3/26.    PT Comments    Pt supine in bed on arrival this session.  Focus on increasing activity tolerance while monitoring HR.  Pt able to increase gt distance and balance is much improved.  Based on progress will inform supervising PT of need for change in recommendations at this time.  Pt continues to make functional gains.      Follow Up Recommendations  Home health PT     Equipment Recommendations  (defer to post acute)    Recommendations for Other Services       Precautions / Restrictions Precautions Precautions: Fall;Other (comment) Precaution Comments: impulsive Restrictions Weight Bearing Restrictions: No    Mobility  Bed Mobility Overal bed mobility: Needs Assistance Bed Mobility: Supine to Sit;Sit to Supine     Supine to sit: Modified independent (Device/Increase time) Sit to supine: Modified independent (Device/Increase time)   General bed mobility comments: No assistance needed to move to edge of bed or return, she does present with front entry into her bed but this is likely baseline.  Transfers Overall transfer level: Needs assistance Equipment used: None Transfers: Sit to/from Stand Sit to Stand: Supervision         General transfer comment: Supervision for safety, no LOB noted rising into standing.  Ambulation/Gait Ambulation/Gait assistance: Supervision Gait Distance (Feet): 10 Feet(+50 ft +  60 ft.) Assistive device: Rolling walker (2 wheeled) Gait Pattern/deviations: Step-through pattern;Decreased stride length     General Gait Details: Mild unsteadiness during turns.  Pt required close supervision at this time but improving.  HR 114-140bpm, but did not exceed 140 with increased activity.   Stairs             Wheelchair Mobility    Modified Rankin (Stroke Patients Only) Modified Rankin (Stroke Patients Only) Pre-Morbid Rankin Score: No symptoms Modified Rankin: Moderate disability     Balance Overall balance assessment: Needs assistance   Sitting balance-Leahy Scale: Normal       Standing balance-Leahy Scale: Fair                              Cognition Arousal/Alertness: Awake/alert Behavior During Therapy: Flat affect Overall Cognitive Status: Impaired/Different from baseline Area of Impairment: Following commands;Safety/judgement                       Following Commands: Follows one step commands with increased time Safety/Judgement: Decreased awareness of safety;Decreased awareness of deficits     General Comments: Pt continues to be impulsive      Exercises      General Comments        Pertinent Vitals/Pain Pain Assessment: Faces Faces Pain Scale: No hurt Pain Location: generalized    Home Living                      Prior Function            PT  Goals (current goals can now be found in the care plan section) Acute Rehab PT Goals Patient Stated Goal: Unable to participate in goal setting. Potential to Achieve Goals: Good Progress towards PT goals: Progressing toward goals    Frequency    Min 4X/week      PT Plan Discharge plan needs to be updated    Co-evaluation              AM-PAC PT "6 Clicks" Mobility   Outcome Measure  Help needed turning from your back to your side while in a flat bed without using bedrails?: None Help needed moving from lying on your back to sitting on  the side of a flat bed without using bedrails?: None Help needed moving to and from a bed to a chair (including a wheelchair)?: A Little Help needed standing up from a chair using your arms (e.g., wheelchair or bedside chair)?: A Little Help needed to walk in hospital room?: A Little Help needed climbing 3-5 steps with a railing? : A Little 6 Click Score: 20    End of Session   Activity Tolerance: Patient tolerated treatment well(limited due to elevate HR) Patient left: in chair;with call bell/phone within reach;with chair alarm set Nurse Communication: Mobility status PT Visit Diagnosis: Muscle weakness (generalized) (M62.81);Other symptoms and signs involving the nervous system (R29.898)     Time: 1001-1016 PT Time Calculation (min) (ACUTE ONLY): 15 min  Charges:  $Gait Training: 8-22 mins                     Erasmo Leventhal , PTA Acute Rehabilitation Services Pager [phone] Office [phone]     [name] 04/09/2019, 10:39 AM

## 2019-04-10 DIAGNOSIS — R479 Unspecified speech disturbances: Secondary | ICD-10-CM

## 2019-04-10 DIAGNOSIS — F1990 Other psychoactive substance use, unspecified, uncomplicated: Secondary | ICD-10-CM

## 2019-04-10 LAB — CBC WITH DIFFERENTIAL/PLATELET
Abs Immature Granulocytes: 0.22 10*3/uL — ABNORMAL HIGH (ref 0.00–0.07)
Basophils Absolute: 0.1 10*3/uL (ref 0.0–0.1)
Basophils Relative: 0 %
Eosinophils Absolute: 0.1 10*3/uL (ref 0.0–0.5)
Eosinophils Relative: 1 %
HCT: 28 % — ABNORMAL LOW (ref 36.0–46.0)
Hemoglobin: 9 g/dL — ABNORMAL LOW (ref 12.0–15.0)
Immature Granulocytes: 1 %
Lymphocytes Relative: 14 %
Lymphs Abs: 2.2 10*3/uL (ref 0.7–4.0)
MCH: 26.9 pg (ref 26.0–34.0)
MCHC: 32.1 g/dL (ref 30.0–36.0)
MCV: 83.8 fL (ref 80.0–100.0)
Monocytes Absolute: 0.6 10*3/uL (ref 0.1–1.0)
Monocytes Relative: 4 %
Neutro Abs: 12.5 10*3/uL — ABNORMAL HIGH (ref 1.7–7.7)
Neutrophils Relative %: 80 %
Platelets: 535 10*3/uL — ABNORMAL HIGH (ref 150–400)
RBC: 3.34 MIL/uL — ABNORMAL LOW (ref 3.87–5.11)
RDW: 18 % — ABNORMAL HIGH (ref 11.5–15.5)
WBC: 15.8 10*3/uL — ABNORMAL HIGH (ref 4.0–10.5)
nRBC: 0 % (ref 0.0–0.2)

## 2019-04-10 LAB — VANCOMYCIN, TROUGH: Vancomycin Tr: 8 ug/mL — ABNORMAL LOW (ref 15–20)

## 2019-04-10 MED ORDER — VANCOMYCIN HCL IN DEXTROSE 1-5 GM/200ML-% IV SOLN
1000.0000 mg | Freq: Three times a day (TID) | INTRAVENOUS | Status: DC
  Administered 2019-04-10 – 2019-04-12 (×7): 1000 mg via INTRAVENOUS
  Filled 2019-04-10 (×8): qty 200

## 2019-04-10 MED ORDER — MELATONIN 3 MG PO TABS
3.0000 mg | ORAL_TABLET | Freq: Every evening | ORAL | Status: DC | PRN
  Administered 2019-04-10 – 2019-04-23 (×6): 3 mg via ORAL
  Filled 2019-04-10 (×7): qty 1

## 2019-04-10 NOTE — Progress Notes (Signed)
Pharmacy Antibiotic Note  Lauren Reyes is a 29 y.o. female admitted on 04/01/2019 as a code stroke with aphasia and right-sided weakness. Pharmacy has been consulted for vancomycin dosing.  TEE on 3/30 revealed severe endocarditis involving aortic, mitral, and tricuspid valves with severe aortic regurgitation. Patient's condition is further complicated by septic emboli to her brain, lungs, and spleen. Respiratory culture grew MSSA, however, recent blood cultures are negative. Appears to have received at least one dose of cefazolin prior to culture draw, but will plan to treat with vancomycin and ceftriaxone to cover MSSA and other causes of culture-negative endocarditis.  Afebrile, WBC decreasing 15.8, Scr stable.  4/11 VP =27 4/12 VT = 8 Calculated AUC ~ 392.  Since patient has concern for cerebral emboli, will target trough of 15-20.  Plan: -Increase vancomcyin to 1000 mg q8h -Estimated AUC 587. While this AUC is higher than normal target, will continue this dose since there is concern for CNS involvement. -Goal trough 15-20. -Continue ceftriaxone IV 2g q12 her ID -recheck peak and trough levels at steady state. Monitor renal function, clinical improvement, WBC, and temperature  Height: 5\' 5"  (165.1 cm) Weight: 72.3 kg (159 lb 6.3 oz) IBW/kg (Calculated) : 57  Temp (24hrs), Avg:98.4 F (36.9 C), Min:98 F (36.7 C), Max:98.8 F (37.1 C)  Recent Labs  Lab 04/04/19 0405 04/04/19 0405 04/04/19 1543 04/05/19 0506 04/05/19 0916 04/06/19 0526 04/07/19 0500 04/08/19 0530 04/09/19 0535 04/09/19 2013 04/10/19 0512  WBC 29.5*   < > 27.8*  --    < > 22.2* 21.5* 18.7* 16.3*  --  15.8*  CREATININE 0.61  --  0.59 0.52  --   --   --  0.49  --   --   --   VANCOTROUGH  --   --   --   --   --   --   --   --   --   --  8*  VANCOPEAK  --   --   --   --   --   --   --   --   --  27*  --    < > = values in this interval not displayed.    Estimated Creatinine Clearance: 102.4  mL/min (by C-G formula based on SCr of 0.49 mg/dL).    No Known Allergies  Antimicrobials this admission: Zosyn 3/25 >> 3/25 Cefepime 3/25 >> 3/27 Ancef 3/27 >> 3/30 Vanc 3/25 >> 3/27; restarted 3/30 >>  Unasyn 3/30>>3/31 Ceftriaxone 3/31>>(5/9)  Dose adjustments this admission: N/A  Microbiology results: 3/24 COVID/Flu >> neg 3/25 RCx >> moderate MSSA 3/25 MRSA PCR >> negative 3/25 BCx >> negative   Thank you for allowing pharmacy to be a part of this patient's care.  4/25, PharmD PGY1 Acute Care Pharmacy Resident 04/10/2019 1:20 PM

## 2019-04-10 NOTE — Evaluation (Signed)
Speech Language Pathology Evaluation Patient Details Name: Lauren Reyes MRN: [id number] DOB: March 21, 1990 Today's Date: 04/10/2019 Time:  -     Problem List:  Patient Active Problem List   Diagnosis Date Noted  . Normocytic anemia 04/07/2019  . Polysubstance abuse (HCC) 04/07/2019  . IVDU (intravenous drug user) 04/07/2019  . Cerebrovascular accident (CVA) due to embolism of precerebral artery (HCC)   . Infective endocarditis   . Acute respiratory failure (HCC)   . DVT (deep venous thrombosis) (HCC)   . Encounter for central line placement   . Endotracheal tube present   . Acute ischemic left MCA stroke (HCC) 04/01/2019  . Middle cerebral artery embolism, left 04/01/2019   Past Medical History: History reviewed. No pertinent past medical history. Past Surgical History:  Past Surgical History:  Procedure Laterality Date  . BUBBLE STUDY  04/07/2019   Procedure: BUBBLE STUDY;  Surgeon: Jodelle Red, MD;  Location: [facility] ENDOSCOPY;  Service: Cardiovascular;;  . IR CT HEAD LTD  04/01/2019  . IR INTRAVSC STENT CERV CAROTID W/O EMB-PROT MOD SED INC ANGIO  04/01/2019  . IR IVC FILTER PLMT / S&I /IMG GUID/MOD SED  04/03/2019  . IR PERCUTANEOUS ART THROMBECTOMY/INFUSION INTRACRANIAL INC DIAG ANGIO  04/01/2019  . RADIOLOGY WITH ANESTHESIA N/A 04/01/2019   Procedure: IR WITH ANESTHESIA;  Surgeon: Julieanne Cotton, MD;  Location: [facility] OR;  Service: Radiology;  Laterality: N/A;  . TEE WITHOUT CARDIOVERSION N/A 04/07/2019   Procedure: TRANSESOPHAGEAL ECHOCARDIOGRAM (TEE);  Surgeon: Jodelle Red, MD;  Location: [facility] ENDOSCOPY;  Service: Cardiovascular;  Laterality: N/A;   HPI:  Pt is a 29 yo female with suspected IVDU admitted with sudden onset R sided weakness and aphasia, now s/p revascularization by IR of L MCA. MRI showed numerous smaller acute infarcts scattered throughout the brain bilaterally. ETT 3/24-3/27.   Assessment / Plan / Recommendation Clinical Impression  Pt  demonstrates a moderate dysarthia with adequate gross motor lingual movement allowing for intelligible but slurred speech. There are also instances of neurogenic stuttering. Pt able to appropximate all articulatory movements with decreased precision. For example, /dz/ is simplified to /d/ and /sh/ is /s/. In continuous speech verbal cues to overarticulate and increase intraoral pressure and volume improved intelligibility and accuracy. At the sound and syllable level visual articualtion cues were used to shape the sounds with improved accuracy by exagerating lip pursing etc. Pt would benefit from working on singing and reciting pharses with overarticualtion strategies. Othewise pts cognition and language are intact. Will continue efforts.     SLP Assessment  SLP Recommendation/Assessment: Patient needs continued Speech Lanaguage Pathology Services SLP Visit Diagnosis: Dysarthria and anarthria (R47.1)    Follow Up Recommendations  Outpatient SLP    Frequency and Duration min 2x/week  2 weeks      SLP Evaluation Cognition  Overall Cognitive Status: Within Functional Limits for tasks assessed       Comprehension  Auditory Comprehension Overall Auditory Comprehension: Appears within functional limits for tasks assessed    Expression Verbal Expression Overall Verbal Expression: Appears within functional limits for tasks assessed   Oral / Motor  Oral Motor/Sensory Function Overall Oral Motor/Sensory Function: Moderate impairment Facial ROM: Reduced right;Suspected CN VII (facial) dysfunction Facial Symmetry: Abnormal symmetry right;Suspected CN VII (facial) dysfunction Facial Strength: Reduced right;Suspected CN VII (facial) dysfunction Facial Sensation: Reduced right;Suspected CN V (Trigeminal) dysfunction Lingual Symmetry: Within Functional Limits Lingual Strength: Reduced Velum: Within Functional Limits Motor Speech Overall Motor Speech: Impaired Respiration: Within functional  limits Phonation: Normal  Resonance: Hypernasality Articulation: Impaired Level of Impairment: Word Intelligibility: Intelligible Motor Planning: Witnin functional limits Motor Speech Errors: Aware   GO                   [name], MA CCC-SLP  Acute Rehabilitation Services Pager [phone] Office [phone]  Lynann Beaver 04/10/2019, 2:26 PM

## 2019-04-10 NOTE — Progress Notes (Signed)
MD aware of pt current HR

## 2019-04-10 NOTE — Progress Notes (Signed)
Patient ID: Lauren Reyes, female   DOB: 09/24/1990, 29 y.o.   MRN: [id number]         [facility]  Date of Admission:  04/01/2019   Total days of antibiotics 10         ASSESSMENT: She continues to improve on therapy for culture-negative, multi valvular endocarditis complicated by septic emboli causing a left middle cerebral artery stroke.  PLAN: 1. Continue vancomycin and ceftriaxone for 6 weeks total through 05/17/2019 (this will need to be given in a supervised setting) 2. Please call me for any infectious disease questions this weekend  Active Problems:   Infective endocarditis   Acute ischemic left MCA stroke (HCC)   Middle cerebral artery embolism, left   Acute respiratory failure (HCC)   DVT (deep venous thrombosis) (HCC)   Encounter for central line placement   Endotracheal tube present   Cerebrovascular accident (CVA) due to embolism of precerebral artery (HCC)   Normocytic anemia   Polysubstance abuse (HCC)   IVDU (intravenous drug user)   Scheduled Meds: . sodium chloride   Intravenous Once  . sodium chloride   Intravenous Once  . aspirin  81 mg Oral Daily   Or  . aspirin  81 mg Per Tube Daily  . atorvastatin  80 mg Per Tube q1800  . chlorhexidine gluconate (MEDLINE KIT)  15 mL Mouth Rinse BID  . Chlorhexidine Gluconate Cloth  6 each Topical Daily  . feeding supplement (ENSURE ENLIVE)  237 mL Oral TID BM  . furosemide  20 mg Intravenous Once  . potassium chloride  40 mEq Oral Once  . sodium chloride flush  10-40 mL Intracatheter Q12H  . sodium chloride flush  3 mL Intravenous Once  . ticagrelor  90 mg Oral BID  . vitamin B-12  1,000 mcg Per Tube Daily   Or  . vitamin B-12  1,000 mcg Oral Daily   Continuous Infusions: . sodium chloride Stopped (04/05/19 1824)  . sodium chloride Stopped (04/06/19 0609)  . sodium chloride 75 mL/hr at 04/09/19 1613  . cefTRIAXone (ROCEPHIN)  IV 2 g (04/10/19 0940)  . vancomycin     PRN  Meds:.sodium chloride, acetaminophen **OR** acetaminophen (TYLENOL) oral liquid 160 mg/5 mL **OR** acetaminophen, melatonin, ondansetron (ZOFRAN) IV, senna-docusate, sodium chloride flush   SUBJECTIVE: Lauren Reyes told me that she feels like she is starting to "lose hope".  She says that her infection and endocarditis have been a real "wake-up call" for her.  She says that she has been very open about her addiction and drug use with her family and friends and knows that she can quit.  She quit cold turkey several years ago after moving back to [place] and was sober for quite some time before relapsing.  She does not want to take Suboxone or methadone.  Review of Systems: Review of Systems  Constitutional: Negative for fever and weight loss.  Respiratory: Negative for cough and shortness of breath.   Cardiovascular: Negative for chest pain.  Gastrointestinal: Negative for abdominal pain, diarrhea, nausea and vomiting.  Genitourinary: Negative for dysuria.  Musculoskeletal: Negative for back pain and joint pain.    No Known Allergies  OBJECTIVE: Vitals:   04/09/19 1302 04/09/19 1653 04/10/19 0857 04/10/19 1156  BP: 129/64 (!) 108/45 (!) 120/53 (!) 106/52  Pulse: (!) 108 (!) 110 (!) 113 (!) 112  Resp: 18 16 18 18  Temp: 98.7 F (37.1 C) 98.8 F (37.1 C) 98.3 F (36.8 C) 98   F (36.7 C)  TempSrc: Oral Oral Oral Oral  SpO2: 100% 100% 99% 100%  Weight:      Height:       Body mass index is 26.52 kg/m.  Physical Exam Constitutional:      Comments: She became tearful when talking about her addiction.  Cardiovascular:     Rate and Rhythm: Normal rate and regular rhythm.     Heart sounds: Murmur present.     Comments: No change in her 2/6 systolic/diastolic murmurs. Pulmonary:     Effort: Pulmonary effort is normal.     Breath sounds: Normal breath sounds.  Abdominal:     Palpations: Abdomen is soft.     Tenderness: There is no abdominal tenderness.  Musculoskeletal:         General: No swelling or tenderness.  Skin:    Findings: No rash.  Neurological:     Comments: Right-sided weakness and speech continue to improve.  She was able to stand and do some walking in her room yesterday with physical therapy.  Psychiatric:        Mood and Affect: Mood normal.     Lab Results Lab Results  Component Value Date   WBC 15.8 (H) 04/10/2019   HGB 9.0 (L) 04/10/2019   HCT 28.0 (L) 04/10/2019   MCV 83.8 04/10/2019   PLT 535 (H) 04/10/2019    Lab Results  Component Value Date   CREATININE 0.49 04/08/2019   BUN 5 (L) 04/08/2019   NA 137 04/08/2019   K 3.0 (L) 04/08/2019   CL 104 04/08/2019   CO2 24 04/08/2019    Lab Results  Component Value Date   ALT 40 04/01/2019   AST 63 (H) 04/01/2019   ALKPHOS 112 04/01/2019   BILITOT 0.9 04/01/2019     Microbiology: Recent Results (from the past 240 hour(s))  Respiratory Panel by RT PCR (Flu A&B, Covid) - Nasopharyngeal Swab     Status: None   Collection Time: 04/01/19  8:26 PM   Specimen: Nasopharyngeal Swab  Result Value Ref Range Status   SARS Coronavirus 2 by RT PCR NEGATIVE NEGATIVE Final    Comment: (NOTE) SARS-CoV-2 target nucleic acids are NOT DETECTED. The SARS-CoV-2 RNA is generally detectable in upper respiratoy specimens during the acute phase of infection. The lowest concentration of SARS-CoV-2 viral copies this assay can detect is 131 copies/mL. A negative result does not preclude SARS-Cov-2 infection and should not be used as the sole basis for treatment or other patient management decisions. A negative result may occur with  improper specimen collection/handling, submission of specimen other than nasopharyngeal swab, presence of viral mutation(s) within the areas targeted by this assay, and inadequate number of viral copies (<131 copies/mL). A negative result must be combined with clinical observations, patient history, and epidemiological information. The expected result is Negative. Fact  Sheet for Patients:  PinkCheek.be Fact Sheet for Healthcare Providers:  GravelBags.it This test is not yet ap proved or cleared by the [place] FDA and  has been authorized for detection and/or diagnosis of SARS-CoV-2 by FDA under an Emergency Use Authorization (EUA). This EUA will remain  in effect (meaning this test can be used) for the duration of the COVID-19 declaration under Section 564(b)(1) of the Act, 21 U.S.C. section 360bbb-3(b)(1), unless the authorization is terminated or revoked sooner.    Influenza A by PCR NEGATIVE NEGATIVE Final   Influenza B by PCR NEGATIVE NEGATIVE Final    Comment: (NOTE) The Xpert Xpress SARS-CoV-2/FLU/RSV  assay is intended as an aid in  the diagnosis of influenza from Nasopharyngeal swab specimens and  should not be used as a sole basis for treatment. Nasal washings and  aspirates are unacceptable for Xpert Xpress SARS-CoV-2/FLU/RSV  testing. Fact Sheet for Patients: https://www.fda.gov/media/142436/download Fact Sheet for Healthcare Providers: https://www.fda.gov/media/142435/download This test is not yet approved or cleared by the [place] FDA and  has been authorized for detection and/or diagnosis of SARS-CoV-2 by  FDA under an Emergency Use Authorization (EUA). This EUA will remain  in effect (meaning this test can be used) for the duration of the  Covid-19 declaration under Section 564(b)(1) of the Act, 21  U.S.C. section 360bbb-3(b)(1), unless the authorization is  terminated or revoked. Performed at [facility], [address] [place], [place] [address]   MRSA PCR Screening     Status: None   Collection Time: 04/02/19 12:01 AM   Specimen: Nasopharyngeal  Result Value Ref Range Status   MRSA by PCR NEGATIVE NEGATIVE Final    Comment:        The GeneXpert MRSA Assay (FDA approved for NASAL specimens only), is one component of a comprehensive MRSA  colonization surveillance program. It is not intended to diagnose MRSA infection nor to guide or monitor treatment for MRSA infections. Performed at [facility], [address] [place], [place] [address]   Culture, blood (Routine X 2) w Reflex to ID Panel     Status: None   Collection Time: 04/02/19 12:13 AM   Specimen: BLOOD RIGHT HAND  Result Value Ref Range Status   Specimen Description BLOOD RIGHT HAND  Final   Special Requests   Final    BOTTLES DRAWN AEROBIC AND ANAEROBIC Blood Culture adequate volume   Culture   Final    NO GROWTH 5 DAYS Performed at [facility], [address] [place], [place] [address]    Report Status 04/07/2019 FINAL  Final  Culture, respiratory (non-expectorated)     Status: None   Collection Time: 04/02/19  2:50 AM   Specimen: Tracheal Aspirate; Respiratory  Result Value Ref Range Status   Specimen Description TRACHEAL ASPIRATE  Final   Special Requests NONE  Final   Gram Stain   Final    RARE WBC PRESENT, PREDOMINANTLY MONONUCLEAR RARE GRAM POSITIVE COCCI IN CLUSTERS Performed at  Hospital Lab, [address] [place], [place] [address]    Culture MODERATE STAPHYLOCOCCUS AUREUS  Final   Report Status 04/04/2019 FINAL  Final   Organism ID, Bacteria STAPHYLOCOCCUS AUREUS  Final      Susceptibility   Staphylococcus aureus - MIC*    CIPROFLOXACIN <=0.5 SENSITIVE Sensitive     ERYTHROMYCIN <=0.25 SENSITIVE Sensitive     GENTAMICIN <=0.5 SENSITIVE Sensitive     OXACILLIN 0.5 SENSITIVE Sensitive     TETRACYCLINE <=1 SENSITIVE Sensitive     VANCOMYCIN 1 SENSITIVE Sensitive     TRIMETH/SULFA <=10 SENSITIVE Sensitive     CLINDAMYCIN <=0.25 SENSITIVE Sensitive     RIFAMPIN <=0.5 SENSITIVE Sensitive     Inducible Clindamycin NEGATIVE Sensitive     * MODERATE STAPHYLOCOCCUS AUREUS  Culture, blood (Routine X 2) w Reflex to ID Panel     Status: None   Collection Time: 04/02/19  3:21 AM   Specimen: BLOOD  Result Value Ref Range  Status   Specimen Description BLOOD RIGHT ARM  Final   Special Requests   Final    BOTTLES DRAWN AEROBIC ONLY Blood   Culture results may not be optimal due to an inadequate volume of blood received in culture bottles   Culture   Final    NO GROWTH 5 DAYS Performed at [facility], [place] [address], [place], [place] [address]    Report Status 04/07/2019 FINAL  Final    Michel Bickers, MD [facility] [place] [phone] pager   [phone] cell 04/10/2019, 1:21 PM

## 2019-04-10 NOTE — Progress Notes (Signed)
STROKE TEAM PROGRESS NOTE   INTERVAL HISTORY Patient is lying comfortably in bed.  Patient is doing well and  and speech is clear and facial weakness is also improved.  Marland Kitchen Physical therapist now recommend home health PT OT.    ID team feel patient needs at least 6 weeks of vancomycin and ceftriaxone and will need a PICC line and needs to stay inhouse as she is too high risk to go home with her h/o iv drug abuse. Vitals:   04/09/19 1302 04/09/19 1653 04/10/19 0857 04/10/19 1156  BP: 129/64 (!) 108/45 (!) 120/53 (!) 106/52  Pulse: (!) 108 (!) 110 (!) 113 (!) 112  Resp: 18 16 18 18   Temp: 98.7 F (37.1 C) 98.8 F (37.1 C) 98.3 F (36.8 C) 98 F (36.7 C)  TempSrc: Oral Oral Oral Oral  SpO2: 100% 100% 99% 100%  Weight:      Height:        CBC:  Recent Labs  Lab 04/09/19 0535 04/10/19 0512  WBC 16.3* 15.8*  NEUTROABS 12.6* 12.5*  HGB 9.1* 9.0*  HCT 28.1* 28.0*  MCV 82.2 83.8  PLT 493* 535*    Basic Metabolic Panel:  Recent Labs  Lab 04/05/19 0506 04/05/19 0506 04/05/19 1419 04/08/19 0530  NA 143  --   --  137  K 2.9*   < > 3.4* 3.0*  CL 110  --   --  104  CO2 21*  --   --  24  GLUCOSE 87  --   --  105*  BUN <5*  --   --  5*  CREATININE 0.52  --   --  0.49  CALCIUM 7.9*  --   --  7.7*   < > = values in this interval not displayed.   Lipid Panel:     Component Value Date/Time   CHOL 163 04/02/2019 0012   TRIG 258 (H) 04/02/2019 0012   HDL <10 (L) 04/02/2019 0012   CHOLHDL NOT CALCULATED 04/02/2019 0012   VLDL 52 (H) 04/02/2019 0012   LDLCALC NOT CALCULATED 04/02/2019 0012   HgbA1c:  Lab Results  Component Value Date   HGBA1C 5.9 (H) 04/02/2019   Urine Drug Screen:     Component Value Date/Time   LABOPIA NONE DETECTED 04/01/2019 2349   COCAINSCRNUR NONE DETECTED 04/01/2019 2349   LABBENZ NONE DETECTED 04/01/2019 2349   AMPHETMU POSITIVE (A) 04/01/2019 2349   THCU POSITIVE (A) 04/01/2019 2349   LABBARB NONE DETECTED 04/01/2019 2349    Alcohol Level No  results found for: ETH  IMAGING past 24h No results found.    PHYSICAL EXAM  Pleasant young African lady not in distress. . Afebrile. Head is nontraumatic. Neck is supple without bruit.    Cardiac exam no murmur or gallop. Lungs are clear to auscultation. Distal pulses are well felt. Neurological Exam :  Awake alert oriented to place and person.  Mild Expressive aphasia and speaks only a few words and short sentences.  Dysarthria improved.  Good comprehension.  Difficulty with naming and repetition.  Extraocular movements full range without nystagmus.  Mild right lower facial weakness.  Tongue midline.  Motor system exam shows mild weakness of right grip intrinsic hand muscles and orbits left over right upper extremity.  Moves all 4 extremities well otherwise.  Gait not tested. ASSESSMENT/PLAN Ms. Bobbye 04/03/2019 Villers is a 29 y.o. female with unknown medical history, possible heroin abuse, presenting with aphasia and R sided weakness. tPA not given d/t  multiple BUL lung nodules w/ concern for septic embolic. Found to have L M3 occlusion and sent to IR for mechanical thrombectomy.  Stroke:  left MCA infarct s/p IR L M3 occlusion w/ TICI3 revascularization, stent to L cervical ICA over focal dissection small pseudoaneurysm. 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 ASPECTS 8    CTA head & neck - L MCA M3 branch occlusion. Multiple B upper lung nodules, ? Septic emboli.   CT perfusion - 57mL core infarct w/ 10ml L MCA penumbra  Cerebral angio - L M3 occlusion s/p TICI3 revascularization. Pipeline sent placed at site of focal dissection associated w/ small pseudoaneurysm distal cervical L ICA.   CT head - 3/24 repeat. L MCA contrast staining. Minimal L frontal lobe white matter infarct.   MRI - 04/02/19 - Small 2-3 cm confluent infarct at the Left Insula with Heidelberg Classification 1b petechial  hemorrhage. But numerous smaller acute infarcts scattered throughout the brain.  Several other areas of similar petechial hemorrhage. This constellation remains suspicious for septic emboli. Suggestion also of trace subarachnoid hemorrhage.  MRA - negative for vessel occlusion. The left ICA stent is patent.  CXR - 04/04/19 - Congestive heart failure. Mild patchy opacity of right mid lung may be due to asymmetric pulmonary edema  LE Doppler - Right common femoral vein acute DVT  CT Abdomen and Pelvis - 04/04/19 - No retroperitoneal hematoma; however, concerning for possible renal and splenic infarcts.  2D Echo EF 55-60%, no vegetation seen  TEE shows aortic, tricuspid and mitral valve endocarditis with significant aortic regurg  LDL NOT CALCULATED d/t TG 409. direct LDL - 42.9  HgbA1c 5.9  SARS - negative  HIV - non reactive  SCDs for VTE prophylaxis  unknown meds prior to admission, now on aspirin 81 mg daily and Brilinta (ticagrelor) 90 mg bid for stent.   P2Y12 - 115 - for antiplatelet dose adjustment by NIR  Therapy recommendations:  CIR  Disposition:  pending   Acute Respiratory Failure, resolved  Intubated for IR  left intubated for airway protection  Sedated  Weaned to extubation -> stable  Lung Nodules, possible septic emboli ? infective endocarditis Fever   CT chest B lung multifocal nodularity, largest superior RLL. Dif includes: PNA, COVID, septic emboli and mets. Mild cardiomegaly. Heterogenous B nephrogram. ET in mid trachea. GT in stomach. Tissue in mediastinum c/w residual thymic tissue.  WBC 19.5->21.4->26.4->25.6->29.5->22.2  TMax 100.5  No nuchal rigidity - less likely meningitis - not LP candidate given on brilinta   Zosyn 3/25>>3/25  Vanc 3/25>>3/27  Cefepime 3/25>>3/27  Cefazolin 3/27>>  Blood Cx no growth 4 days  Sputum Cx - MODERATE STAPHYLOCOCCUS AUREUS   DVT  LE venous doppler - right common femoral vein DVT  Not candidate  for anticoagulation due to large right groin hematoma and possible endocarditis  IVC filter 3/27   Substance abuse, Possible IVDU Sinus Tachycardia, possible withdrawal, stable  UDS:  THC POSITIVE, Amphetamines POSITIVE  Suspicious for heroin use.   Multiple needle marks b/l UEs, L>R.   Treated w/ Fentanyl gtt in ICU  CIWA protocol  R groin hematoma  Sheath removed post IR w/ angioseal placed  New R groin hematoma on rounds  CT pelvis large diffuse hematoma anterior R groin and upper thigh NOT extending into intra-abd retroperitoneal structures. Limited eval for pseudoaneurysm  LE pseudoaneurysm [place] - No evidence of pseudoaneurysm, or  AVF.   Hgb 11.2->12.2->9.3->8.6  Not candidate for anticoagulation at this time  Hypotension . BP goal per IR x 24h 120-140  . Treated w/ phenylephrine . BP stable  . Long-term BP goal normotensive  Possible Hyperlipidemia  LDL NOT CALCULATED d/t TG 258.   Direct LDL 42.9, at Goal LDL < 70  Now on Lipitor 80  Dysphagia, resolved Secondary to stroke Speech on board Cleared for diet  Elevated blood hcG  I-stat hcG 7.4  HCG beta chain - neg  Repeat HcG beta chain - <1  Urine pregnancy test - negative  B12 deficiency   B12 = 175  B12 supplement  Other Stroke Risk Factors  Substance abuse  Other Active Problems  Hyponatremia, resolved  Hospital day # 9  Appreciate CVTS and infectious disease help.  Plan is to treat endocarditis with 6 weeks of vancomycin and ceftriaxone .  However patient cannot be discharged home with a PICC line due to concerns about her IV drug abuse history and she will be difficult placement in skilled nursing facility due to her young age.. Plan to consult medical hospitalist team to transfer and take over on their service to manage her medical problems.  Discussed with  Dr. Megan Salon and Dr. Florencia Reasons Greater than 50% time during this 25-minute visit was spent on counseling and coordination of  care about embolic stroke and answering questions and discussion with care team Antony Contras, MD To contact Stroke Continuity provider, please refer to [email or web address] After hours, contact General Neurology

## 2019-04-10 NOTE — Progress Notes (Signed)
Physical Therapy Treatment Patient Details Name: Lauren Reyes MRN: [id number] DOB: March 01, 1990 Today's Date: 04/10/2019    History of Present Illness 29 year old female with suspected IV drug abuse who was being interviewed by police tonight and developed right-sided facial weakness, aphasia, and R weakness.  She was brought by EMS to the emergency room work-up revealed occluded left MCA. Revascularization performed by IR. CT head and neck demonstrate multiple small cavitary abscesses in the upper lobes bilaterally. IVC filter placement for RLE DVT on 3/26.    PT Comments    Pt supine in bed on arrival this session.  She was soiled in urine and required clean up which she performed independently.  Pt continues to progress with mild instability during gt requiring HHA to correct.  HR elevated this session to 131 bpm but that was the highest and it decreased when activity stopped.  Continue to recommend HHPT at d/c.      Follow Up Recommendations  Home health PT     Equipment Recommendations  (defer to post acute)    Recommendations for Other Services       Precautions / Restrictions Precautions Precautions: Fall;Other (comment) Precaution Comments: impulsive Restrictions Weight Bearing Restrictions: No    Mobility  Bed Mobility Overal bed mobility: Needs Assistance Bed Mobility: Supine to Sit     Supine to sit: Modified independent (Device/Increase time)        Transfers Overall transfer level: Needs assistance Equipment used: None Transfers: Sit to/from Stand Sit to Stand: Modified independent (Device/Increase time)            Ambulation/Gait Ambulation/Gait assistance: Supervision Gait Distance (Feet): 80 Feet Assistive device: 1 person hand held assist Gait Pattern/deviations: Step-through pattern;Decreased stride length;Wide base of support;Trunk flexed     General Gait Details: Mild unsteadiness, pt reaching for furniture required HHA to improve  balance.  HR 131 bpm at it's highest.   Stairs             Wheelchair Mobility    Modified Rankin (Stroke Patients Only)       Balance Overall balance assessment: Needs assistance Sitting-balance support: Feet supported Sitting balance-Leahy Scale: Normal       Standing balance-Leahy Scale: Fair                              Cognition Arousal/Alertness: Awake/alert Behavior During Therapy: WFL for tasks assessed/performed;Impulsive Overall Cognitive Status: Within Functional Limits for tasks assessed                                 General Comments: Pt continues to be impulsive but no overt LOB during session.      Exercises      General Comments        Pertinent Vitals/Pain Pain Assessment: Faces Faces Pain Scale: No hurt Pain Location: generalized Pain Descriptors / Indicators: Restless Pain Intervention(s): Monitored during session;Repositioned    Home Living                      Prior Function            PT Goals (current goals can now be found in the care plan section) Acute Rehab PT Goals Patient Stated Goal: Unable to participate in goal setting. Potential to Achieve Goals: Good Progress towards PT goals: Progressing toward goals    Frequency  Min 4X/week      PT Plan Current plan remains appropriate    Co-evaluation              AM-PAC PT "6 Clicks" Mobility   Outcome Measure  Help needed turning from your back to your side while in a flat bed without using bedrails?: None Help needed moving from lying on your back to sitting on the side of a flat bed without using bedrails?: None Help needed moving to and from a bed to a chair (including a wheelchair)?: A Little Help needed standing up from a chair using your arms (e.g., wheelchair or bedside chair)?: A Little Help needed to walk in hospital room?: A Little Help needed climbing 3-5 steps with a railing? : A Little 6 Click Score:  20    End of Session Equipment Utilized During Treatment: Gait belt Activity Tolerance: Patient tolerated treatment well(limited due to elevated HR but lower than previous sessions,) Patient left: in chair;with call bell/phone within reach;with chair alarm set Nurse Communication: Mobility status(bed soiled and informed nurse tech to change bed linens.) PT Visit Diagnosis: Muscle weakness (generalized) (M62.81);Other symptoms and signs involving the nervous system (R29.898)     Time: 6384-5364 PT Time Calculation (min) (ACUTE ONLY): 25 min  Charges:  $Gait Training: 8-22 mins $Therapeutic Activity: 8-22 mins                     Erasmo Leventhal , PTA Acute Rehabilitation Services Pager [phone] Office [phone]     Tyshaun Vinzant Eli Hose 04/10/2019, 1:01 PM

## 2019-04-10 NOTE — Progress Notes (Signed)
  Speech Language Pathology Treatment: Dysphagia  Patient Details Name: Ruqayya Swayzee Wadley MRN: [id number] DOB: [birth year] Today's Date: 04/10/2019 Time: 1340-1400 SLP Time Calculation (min) (ACUTE ONLY): 20 min  Assessment / Plan / Recommendation Clinical Impression  Pt is tolerating challenging textures, independent at meals with no signs of aspiration. Observed her with a Caesar salad which was a little messy, but functional and safe. Will sign off for swallowing. See prior note for speech  HPI HPI: Pt is a 29 yo female with suspected IVDU admitted with sudden onset R sided weakness and aphasia, now s/p revascularization by IR of L MCA. MRI showed numerous smaller acute infarcts scattered throughout the brain bilaterally. ETT 3/24-3/27.      SLP Plan  All goals met  Patient needs continued Speech Lanaguage Pathology Services    Recommendations  Diet recommendations: Regular;Thin liquid                Follow up Recommendations: Outpatient SLP SLP Visit Diagnosis: Dysarthria and anarthria (R47.1) Plan: All goals met       GO               [name], MA CCC-SLP  Acute Rehabilitation Services Pager [phone] Office [phone]  Lynann Beaver 04/10/2019, 2:30 PM

## 2019-04-10 NOTE — Progress Notes (Signed)
Pharmacy Antibiotic Note  Lauren Reyes is a 29 y.o. female admitted on 04/01/2019 with andocarditis and septic emboli.  Pharmacy has been consulted for vancomycin dosing.  Vanc peak 27, trough 8 >> AUC 450, at goal.  Plan: This patient's current antibiotics will be continued without adjustments.  Plan for 6 weeks of therapy.  Height: 5\' 5"  (165.1 cm) Weight: 72.3 kg (159 lb 6.3 oz) IBW/kg (Calculated) : 57  Temp (24hrs), Avg:98.4 F (36.9 C), Min:97.7 F (36.5 C), Max:98.8 F (37.1 C)  Recent Labs  Lab 04/04/19 0405 04/04/19 0405 04/04/19 1543 04/04/19 1543 04/05/19 0506 04/05/19 0916 04/06/19 0526 04/07/19 0500 04/08/19 0530 04/09/19 0535 04/09/19 2013 04/10/19 0512  WBC 29.5*   < > 27.8*   < >  --  29.5* 22.2* 21.5* 18.7* 16.3*  --   --   CREATININE 0.61  --  0.59  --  0.52  --   --   --  0.49  --   --   --   VANCOTROUGH  --   --   --   --   --   --   --   --   --   --   --  8*  VANCOPEAK  --   --   --   --   --   --   --   --   --   --  27*  --    < > = values in this interval not displayed.    Estimated Creatinine Clearance: 102.4 mL/min (by C-G formula based on SCr of 0.49 mg/dL).    No Known Allergies   Thank you for allowing pharmacy to be a part of this patient's care.  06/10/19, PharmD, BCPS  04/10/2019 6:16 AM

## 2019-04-11 DIAGNOSIS — E876 Hypokalemia: Secondary | ICD-10-CM

## 2019-04-11 DIAGNOSIS — I82401 Acute embolism and thrombosis of unspecified deep veins of right lower extremity: Secondary | ICD-10-CM

## 2019-04-11 LAB — BASIC METABOLIC PANEL
Anion gap: 9 (ref 5–15)
BUN: <5 mg/dL — ABNORMAL LOW (ref 6–20)
CO2: 22 mmol/L (ref 22–32)
Calcium: 7.9 mg/dL — ABNORMAL LOW (ref 8.9–10.3)
Chloride: 107 mmol/L (ref 98–111)
Creatinine, Ser: 0.41 mg/dL — ABNORMAL LOW (ref 0.44–1.00)
GFR calc Af Amer: >60 mL/min (ref >60)
GFR calc non Af Amer: >60 mL/min (ref >60)
Glucose, Bld: 109 mg/dL — ABNORMAL HIGH (ref 70–99)
Potassium: 2.9 mmol/L — ABNORMAL LOW (ref 3.5–5.1)
Sodium: 138 mmol/L (ref 135–145)

## 2019-04-11 LAB — CBC
HCT: 29.2 % — ABNORMAL LOW (ref 36.0–46.0)
Hemoglobin: 9.4 g/dL — ABNORMAL LOW (ref 12.0–15.0)
MCH: 27.1 pg (ref 26.0–34.0)
MCHC: 32.2 g/dL (ref 30.0–36.0)
MCV: 84.1 fL (ref 80.0–100.0)
Platelets: 572 10*3/uL — ABNORMAL HIGH (ref 150–400)
RBC: 3.47 MIL/uL — ABNORMAL LOW (ref 3.87–5.11)
RDW: 18.6 % — ABNORMAL HIGH (ref 11.5–15.5)
WBC: 15.4 10*3/uL — ABNORMAL HIGH (ref 4.0–10.5)
nRBC: 0 % (ref 0.0–0.2)

## 2019-04-11 MED ORDER — POTASSIUM CHLORIDE CRYS ER 20 MEQ PO TBCR
40.0000 meq | EXTENDED_RELEASE_TABLET | Freq: Two times a day (BID) | ORAL | Status: AC
  Administered 2019-04-11 – 2019-04-13 (×6): 40 meq via ORAL
  Filled 2019-04-11 (×6): qty 2

## 2019-04-11 MED ORDER — LORAZEPAM 2 MG/ML IJ SOLN
0.5000 mg | Freq: Once | INTRAMUSCULAR | Status: AC
  Administered 2019-04-11: 0.5 mg via INTRAVENOUS
  Filled 2019-04-11: qty 1

## 2019-04-11 NOTE — Progress Notes (Signed)
PROGRESS NOTE  Lace Emelynn Rance JKK:[id number] DOB: Zonie 07, 1992 DOA: 04/01/2019 PCP: Patient, No Pcp Per  Brief summary: Kc Wanjiku Jedlicka is a 29 y.o. female with unknown past medical history, possible heroin abuse presents to the emergency department as a code stroke with aphasia and right-sided weakness.  [place] PD called to the hotel room where she was staying as she had overstated her visit, patient was  Found by PD talking on the phone and suddenly started to slur her speech, the right facial droop and had difficulty talking.  When EMS arrived patient still communicating but slowly progressively got worse.    Patient was admitted to neurology ICU intubated after  Thrombectomy She underwent thrombectomy for Left MCA m3 occlusion with recanalization complicated by dissection/pseudoanerusym of left ICA s/p stent.  She was extubated on March 27   HPI/Recap of past 24 hours:  She denies pain,  denies weakness Continue to have dysarthria, but state is improving States" I want  positive vibes and positivity"  She has tachycardia, no fever  Assessment/Plan: Active Problems:   Acute ischemic left MCA stroke ([place])   Middle cerebral artery embolism, left   Acute respiratory failure (HCC)   DVT (deep venous thrombosis) ([place])   Encounter for central line placement   Endotracheal tube present   Cerebrovascular accident (CVA) due to embolism of precerebral artery (HCC)   Infective endocarditis   Normocytic anemia   Polysubstance abuse (HCC)   IVDU (intravenous drug user)  Culture negative endocarditis involving multiple heart valves  septic emboli in left hemisphere of brain, in  lung, spleen with possible splenic and kidney infarcts --Trach aspirate showed M SSA -Leukocytosis improving, WBC peaked at 29.9, today 15.4 -Fever resolved -She was treated with Vanco and Unasyn initially, currently she is on vancomycin and Rocephin -ID recommended continue vancomycin and  ceftriaxone for 6 weeks total through 05/17/2019  -per Thoracic surgery Dr. Servando Snare: patient eventually will require aortic valve replacement, however this is complicated by recent stroke and recent stent on aspirin and Brilinta.  -per Dr. Pia Mau consider surgery if patient developed worsening of heart failure syndrome and she has to be off Brilinta for 7 days -Dr. Pia Mau also requested psychiatric treatment program for IV drug use to be established before considering cardiac surgery intervention    small infarct of left MCA territory. TPA was deferred as concern for septic emboli. -she underwent a left common carotid arteriogram followed by complete revascularization of occluded M3 region of inferior division of left MCA  -S/p Placement of a 4.5 mm x 67m pipeline flow diverter at site of focal dissection associated with a small pseudoaneurysm of distal cervical LT ICA.  -She is on aspirin and Brilinta -a1c 5.9, Vldl 52 -She is on Lipitor 80 mg daily  Dyslipidemia: With low HDL less than 10, high VLDL at 52, high triglyceride at 258 -On Lipitor  Sinus tachycardia -drug withdrawal vs deconditioning -TSH 0.8 -She has received hydration since March 29, she appears to have adequate oral intake, will DC IV fluids  Right common femoral vein DVT -Not a candidate for anticoagulation due to large right groin hematoma post procedure and endocarditis -Status post IVC filter placement March 26 by IR  large right groin hematoma post procedure S/p 75F angioseal for hemostasis at the right CFA access site. Monitor CBC  Blood loss anemia, likely from right groin hematoma Status post PRBC transfusion x2 on March 27 GI Dr GPenelope Coopconsulted on March 27 who do not think patient  has any signs of GI bleed Monitor CBC  B12 deficiency Replace B12  Hypokalemia, replace K, check mag  Hyponatremia present on admission, normalized  Hepatitis C antibody positive, viral load negative HIV screening  negative RPR screening negative  Polysubstance abuse UDS on presentation positive for THC amphetamine Suspect IV drug use with heroin, multiple needle marks bilateral upper extremities Patient state IV drug use ongoing for many years now she is waiting to quit  Deconditioning Per PT: She need assistant for bed mobility and transfers, she needs supervision by one person hand-held for ambulation and gait, she was able to ambulate 80 feet on Hibo 2nd,  Gait Pattern/deviations: Step-through pattern;Decreased stride length;Wide base of support;Trunk flexed General Gait Details: Mild unsteadiness, pt reaching for furniture required HHA to improve balance.  HR 131 bpm at it's highest.  DVT Prophylaxis:scd's  Code Status: full  Family Communication: patient   Disposition Plan:    Patient came from:         home                                                                                                 Anticipated d/c place: TBD  Barriers to d/c OR conditions which need to be met to effect a safe d/c:  Infective endocarditis ,h/o IV drug use, currently no insurance, Medicaid potential   Consultants:  Patient was admitted to neuro ICU on presentation, transferred to hospitalist service on [place] [facility]  Critical care  GI  CR  Thoracic surgery Dr. Servando Snare  Infectious disease  Cardiology for TEE   Interventional neurologist Dr. Estanislado Pandy for mechanical thrombectomy  IR for IVC filter placement  Procedures:  Left subclavian central line placement March 25 Right arm single-lumen PICC line placement March 29 Intubation March 24, extubation on March 27 TEE March 30  Mechanical thrombectomy March 24  ICA stenting March 24 Status post IVC filter placement March 26 by IR    Antibiotics:  Vanco  Rocephin   Objective: BP 117/63 (BP Location: Left Arm)   Pulse (!) 110   Temp 98.3 F (36.8 C) (Oral)   Resp 18   Ht '5\' 5"'  (1.651 m)   Wt 72.3 kg   LMP 02/05/2019  (Approximate) Comment: Neg Preg Test on 04/03/19  SpO2 100%   BMI 26.52 kg/m   Intake/Output Summary (Last 24 hours) at 04/11/2019 0835 Last data filed at 04/11/2019 7793 Gross per 24 hour  Intake 2669.61 ml  Output 950 ml  Net 1719.61 ml   Filed Weights   04/01/19 1900 04/01/19 2023  Weight: 72.3 kg 72.3 kg    Exam: Patient is examined daily including today on 04/11/2019, exams remain the same as of yesterday except that has changed    General:  NAD, pleasant, dysarthria  Cardiovascular: RRR  Respiratory: CTABL  Abdomen: Soft/ND/NT, positive BS  Musculoskeletal: No Edema  Neuro: alert, oriented   Data Reviewed: Basic Metabolic Panel: Recent Labs  Lab 04/04/19 1543 04/05/19 0506 04/05/19 1419 04/08/19 0530 04/11/19 0431  NA 144 143  --  137 138  K 3.6 2.9* 3.4* 3.0*  2.9*  CL 112* 110  --  104 107  CO2 21* 21*  --  24 22  GLUCOSE 101* 87  --  105* 109*  BUN 7 <5*  --  5* <5*  CREATININE 0.59 0.52  --  0.49 0.41*  CALCIUM 7.8* 7.9*  --  7.7* 7.9*   Liver Function Tests: No results for input(s): AST, ALT, ALKPHOS, BILITOT, PROT, ALBUMIN in the last 168 hours. No results for input(s): LIPASE, AMYLASE in the last 168 hours. No results for input(s): AMMONIA in the last 168 hours. CBC: Recent Labs  Lab 04/06/19 0526 04/06/19 0526 04/07/19 0500 04/08/19 0530 04/09/19 0535 04/10/19 0512 04/11/19 0431  WBC 22.2*   < > 21.5* 18.7* 16.3* 15.8* 15.4*  NEUTROABS 17.1*  --  16.0* 14.7* 12.6* 12.5*  --   HGB 8.6*   < > 9.5* 9.2* 9.1* 9.0* 9.4*  HCT 25.4*   < > 28.4* 27.9* 28.1* 28.0* 29.2*  MCV 80.1   < > 79.8* 81.1 82.2 83.8 84.1  PLT 403*   < > 459* 478* 493* 535* 572*   < > = values in this interval not displayed.   Cardiac Enzymes:   No results for input(s): CKTOTAL, CKMB, CKMBINDEX, TROPONINI in the last 168 hours. BNP (last 3 results) No results for input(s): BNP in the last 8760 hours.  ProBNP (last 3 results) No results for input(s): PROBNP in the last  8760 hours.  CBG: Recent Labs  Lab 04/07/19 1955  GLUCAP 93    Recent Results (from the past 240 hour(s))  Respiratory Panel by RT PCR (Flu A&B, Covid) - Nasopharyngeal Swab     Status: None   Collection Time: 04/01/19  8:26 PM   Specimen: Nasopharyngeal Swab  Result Value Ref Range Status   SARS Coronavirus 2 by RT PCR NEGATIVE NEGATIVE Final    Comment: (NOTE) SARS-CoV-2 target nucleic acids are NOT DETECTED. The SARS-CoV-2 RNA is generally detectable in upper respiratoy specimens during the acute phase of infection. The lowest concentration of SARS-CoV-2 viral copies this assay can detect is 131 copies/mL. A negative result does not preclude SARS-Cov-2 infection and should not be used as the sole basis for treatment or other patient management decisions. A negative result may occur with  improper specimen collection/handling, submission of specimen other than nasopharyngeal swab, presence of viral mutation(s) within the areas targeted by this assay, and inadequate number of viral copies (<131 copies/mL). A negative result must be combined with clinical observations, patient history, and epidemiological information. The expected result is Negative. Fact Sheet for Patients:  PinkCheek.be Fact Sheet for Healthcare Providers:  GravelBags.it This test is not yet ap proved or cleared by the [place] FDA and  has been authorized for detection and/or diagnosis of SARS-CoV-2 by FDA under an Emergency Use Authorization (EUA). This EUA will remain  in effect (meaning this test can be used) for the duration of the COVID-19 declaration under Section 564(b)(1) of the Act, 21 U.S.C. section 360bbb-3(b)(1), unless the authorization is terminated or revoked sooner.    Influenza A by PCR NEGATIVE NEGATIVE Final   Influenza B by PCR NEGATIVE NEGATIVE Final    Comment: (NOTE) The Xpert Xpress SARS-CoV-2/FLU/RSV assay is intended  as an aid in  the diagnosis of influenza from Nasopharyngeal swab specimens and  should not be used as a sole basis for treatment. Nasal washings and  aspirates are unacceptable for Xpert Xpress SARS-CoV-2/FLU/RSV  testing. Fact Sheet for Patients: PinkCheek.be Fact Sheet for  Healthcare Providers: GravelBags.it This test is not yet approved or cleared by the [place] and  has been authorized for detection and/or diagnosis of SARS-CoV-2 by  FDA under an Emergency Use Authorization (EUA). This EUA will remain  in effect (meaning this test can be used) for the duration of the  Covid-19 declaration under Section 564(b)(1) of the Act, 21  U.S.C. section 360bbb-3(b)(1), unless the authorization is  terminated or revoked. Performed at [facility], [place] [address] [place], [place] [address]   MRSA PCR Screening     Status: None   Collection Time: 04/02/19 12:01 AM   Specimen: Nasopharyngeal  Result Value Ref Range Status   MRSA by PCR NEGATIVE NEGATIVE Final    Comment:        The GeneXpert MRSA Assay (FDA approved for NASAL specimens only), is one component of a comprehensive MRSA colonization surveillance program. It is not intended to diagnose MRSA infection nor to guide or monitor treatment for MRSA infections. Performed at [facility], [place] [address], [place], [place] [address]   Culture, blood (Routine X 2) w Reflex to ID Panel     Status: None   Collection Time: 04/02/19 12:13 AM   Specimen: BLOOD RIGHT HAND  Result Value Ref Range Status   Specimen Description BLOOD RIGHT HAND  Final   Special Requests   Final    BOTTLES DRAWN AEROBIC AND ANAEROBIC Blood Culture adequate volume   Culture   Final    NO GROWTH 5 DAYS Performed at [facility], [place] [address], [place], [place] [address]    Report Status 04/07/2019 FINAL  Final  Culture, respiratory (non-expectorated)     Status: None    Collection Time: 04/02/19  2:50 AM   Specimen: Tracheal Aspirate; Respiratory  Result Value Ref Range Status   Specimen Description TRACHEAL ASPIRATE  Final   Special Requests NONE  Final   Gram Stain   Final    RARE WBC PRESENT, PREDOMINANTLY MONONUCLEAR RARE GRAM POSITIVE COCCI IN CLUSTERS Performed at [facility], [place] [address], [place], [place] [address]    Culture MODERATE STAPHYLOCOCCUS AUREUS  Final   Report Status 04/04/2019 FINAL  Final   Organism ID, Bacteria STAPHYLOCOCCUS AUREUS  Final      Susceptibility   Staphylococcus aureus - MIC*    CIPROFLOXACIN <=0.5 SENSITIVE Sensitive     ERYTHROMYCIN <=0.25 SENSITIVE Sensitive     GENTAMICIN <=0.5 SENSITIVE Sensitive     OXACILLIN 0.5 SENSITIVE Sensitive     TETRACYCLINE <=1 SENSITIVE Sensitive     VANCOMYCIN 1 SENSITIVE Sensitive     TRIMETH/SULFA <=10 SENSITIVE Sensitive     CLINDAMYCIN <=0.25 SENSITIVE Sensitive     RIFAMPIN <=0.5 SENSITIVE Sensitive     Inducible Clindamycin NEGATIVE Sensitive     * MODERATE STAPHYLOCOCCUS AUREUS  Culture, blood (Routine X 2) w Reflex to ID Panel     Status: None   Collection Time: 04/02/19  3:21 AM   Specimen: BLOOD  Result Value Ref Range Status   Specimen Description BLOOD RIGHT ARM  Final   Special Requests   Final    BOTTLES DRAWN AEROBIC ONLY Blood Culture results may not be optimal due to an inadequate volume of blood received in culture bottles   Culture   Final    NO GROWTH 5 DAYS Performed at [facility], [place] [address], [place], [place] [address]    Report Status 04/07/2019 FINAL  Final     Studies: No results found.  Scheduled Meds: . aspirin  81 mg Oral Daily   Or  . aspirin  81 mg Per Tube Daily  . atorvastatin  80 mg Per Tube q1800  . chlorhexidine gluconate (MEDLINE KIT)  15 mL Mouth Rinse BID  . Chlorhexidine Gluconate Cloth  6 each Topical Daily  . feeding supplement (ENSURE ENLIVE)  237 mL Oral TID BM  . potassium chloride  40 mEq Oral BID   . sodium chloride flush  10-40 mL Intracatheter Q12H  . ticagrelor  90 mg Oral BID  . vitamin B-12  1,000 mcg Per Tube Daily   Or  . vitamin B-12  1,000 mcg Oral Daily    Continuous Infusions: . sodium chloride Stopped (04/06/19 0609)  . sodium chloride 75 mL/hr at 04/11/19 0415  . cefTRIAXone (ROCEPHIN)  IV 2 g (04/11/19 0828)  . vancomycin 1,000 mg (04/11/19 0519)     Time spent: 29mns I have personally reviewed and interpreted on  04/11/2019 daily labs,  imagings as discussed above under date review session and assessment and plans.  I reviewed all nursing notes, pharmacy notes, consultant notes,  vitals, pertinent old records  I have discussed plan of care as described above with RN , patient  on 04/11/2019   FFlorencia ReasonsMD, PhD, FACP  [facility]  Available via Epic secure chat 7am-7pm for nonurgent issues Please page for urgent issues, pager number available through [email or web address] .   04/11/2019, 8:35 AM  LOS: 10 days

## 2019-04-11 NOTE — Plan of Care (Signed)
  Problem: Education: Goal: Knowledge of disease or condition will improve Outcome: Progressing Goal: Knowledge of secondary prevention will improve Outcome: Progressing Goal: Knowledge of patient specific risk factors addressed and post discharge goals established will improve Outcome: Progressing   Problem: Coping: Goal: Will verbalize positive feelings about self Outcome: Progressing Goal: Will identify appropriate support needs Outcome: Progressing   Problem: Health Behavior/Discharge Planning: Goal: Ability to manage health-related needs will improve Outcome: Progressing   Problem: Self-Care: Goal: Ability to participate in self-care as condition permits will improve Outcome: Progressing Goal: Verbalization of feelings and concerns over difficulty with self-care will improve Outcome: Progressing Goal: Ability to communicate needs accurately will improve Outcome: Progressing   Problem: Nutrition: Goal: Risk of aspiration will decrease Outcome: Progressing   Problem: Ischemic Stroke/TIA Tissue Perfusion: Goal: Complications of ischemic stroke/TIA will be minimized Outcome: Progressing   Problem: Education: Goal: Knowledge of General Education information will improve Description: Including pain rating scale, medication(s)/side effects and non-pharmacologic comfort measures Outcome: Progressing   Problem: Health Behavior/Discharge Planning: Goal: Ability to manage health-related needs will improve Outcome: Progressing   Problem: Clinical Measurements: Goal: Ability to maintain clinical measurements within normal limits will improve Outcome: Progressing Goal: Will remain free from infection Outcome: Progressing Goal: Diagnostic test results will improve Outcome: Progressing Goal: Respiratory complications will improve Outcome: Progressing Goal: Cardiovascular complication will be avoided Outcome: Progressing   Problem: Activity: Goal: Risk for activity  intolerance will decrease Outcome: Progressing   Problem: Nutrition: Goal: Adequate nutrition will be maintained Outcome: Progressing   Problem: Coping: Goal: Level of anxiety will decrease Outcome: Progressing   Problem: Elimination: Goal: Will not experience complications related to bowel motility Outcome: Progressing Goal: Will not experience complications related to urinary retention Outcome: Progressing   Problem: Pain Managment: Goal: General experience of comfort will improve Outcome: Progressing   Problem: Safety: Goal: Ability to remain free from injury will improve Outcome: Progressing   Problem: Skin Integrity: Goal: Risk for impaired skin integrity will decrease Outcome: Progressing

## 2019-04-11 NOTE — Progress Notes (Signed)
Spoke with Penni Bombard RN and Tiffany RPh re: DBIV for vanc peak 0745 and trough 1315 tomorrow 4/4, aware. Floor RN draw patient labs.

## 2019-04-11 NOTE — Progress Notes (Signed)
STROKE TEAM PROGRESS NOTE   INTERVAL HISTORY Patient is lying comfortably in bed.  Patient is doing well, speech is improving, still has dysarthria but no aphasia. On Abx for 6 weeks as per ID. Will remain in house for Abx course. Pt was educated on illicit drug cessation. She is willing to do so.   Vitals:   04/11/19 0200 04/11/19 0400 04/11/19 0404 04/11/19 0836  BP: (!) 124/53 117/63 117/63 131/66  Pulse: (!) 115 (!) 110 (!) 110 (!) 115  Resp:  18 18   Temp: 98.8 F (37.1 C) 98.3 F (36.8 C) 98.3 F (36.8 C) 98.4 F (36.9 C)  TempSrc: Oral Oral Oral Oral  SpO2: 100% 100% 100% 100%  Weight:      Height:        CBC:  Recent Labs  Lab 04/09/19 0535 04/09/19 0535 04/10/19 0512 04/11/19 0431  WBC 16.3*   < > 15.8* 15.4*  NEUTROABS 12.6*  --  12.5*  --   HGB 9.1*   < > 9.0* 9.4*  HCT 28.1*   < > 28.0* 29.2*  MCV 82.2   < > 83.8 84.1  PLT 493*   < > 535* 572*   < > = values in this interval not displayed.    Basic Metabolic Panel:  Recent Labs  Lab 04/08/19 0530 04/11/19 0431  NA 137 138  K 3.0* 2.9*  CL 104 107  CO2 24 22  GLUCOSE 105* 109*  BUN 5* <5*  CREATININE 0.49 0.41*  CALCIUM 7.7* 7.9*   Lipid Panel:     Component Value Date/Time   CHOL 163 04/02/2019 0012   TRIG 258 (H) 04/02/2019 0012   HDL <10 (L) 04/02/2019 0012   CHOLHDL NOT CALCULATED 04/02/2019 0012   VLDL 52 (H) 04/02/2019 0012   LDLCALC NOT CALCULATED 04/02/2019 0012   HgbA1c:  Lab Results  Component Value Date   HGBA1C 5.9 (H) 04/02/2019   Urine Drug Screen:     Component Value Date/Time   LABOPIA NONE DETECTED 04/01/2019 2349   COCAINSCRNUR NONE DETECTED 04/01/2019 2349   LABBENZ NONE DETECTED 04/01/2019 2349   AMPHETMU POSITIVE (A) 04/01/2019 2349   THCU POSITIVE (A) 04/01/2019 2349   LABBARB NONE DETECTED 04/01/2019 2349    Alcohol Level No results found for: ETH  IMAGING past 24h No results found.    PHYSICAL EXAM  Pleasant young African lady not in distress. .  Afebrile. Head is nontraumatic. Neck is supple without bruit.    Cardiac exam no murmur or gallop. Lungs are clear to auscultation. Distal pulses are well felt.  Neurological Exam :  Awake alert oriented to place and person. Mild dysarthria but no aphasia, following all simple commands.  Dysarthria has improved. Able to name and repeat. Extraocular movements full range without nystagmus.  Mild right lower facial weakness.  Tongue midline.  Motor system exam shows moving all 4 extremities well symmetrically. Sensation intact bilaterally. FTN intact bilaterally. Gait not tested.   ASSESSMENT/PLAN [name] is a 29 y.o. female with unknown medical history, possible heroin abuse, presenting with aphasia and R sided weakness. tPA not given d/t multiple BUL lung nodules w/ concern for septic emboli. Found to have L M3 occlusion and sent to IR for mechanical thrombectomy.  Stroke:  left MCA infarct s/p IR L M3 occlusion w/ TICI3 revascularization, stent to L cervical ICA due to focal dissection small pseudoaneurysm. Etiology likely due to endocarditis from IVDU  CT head - mild  hypodensity L insula and possible middle frontal gyrus. ASPECTS 8    CTA head & neck - L MCA M3 branch occlusion. Multiple B upper lung nodules, ? Septic emboli.   CT perfusion - 21mL core infarct w/ 31ml L MCA penumbra  Cerebral angio - L M3 occlusion s/p TICI3 revascularization. Pipeline sent placed at site of focal dissection associated w/ small pseudoaneurysm distal cervical L ICA.   MRI - 04/02/19 - Small 2-3 cm confluent infarct at the Left Insula with Heidelberg Classification 1b petechial hemorrhage. But numerous smaller acute infarcts scattered throughout the brain.  Several other areas of similar petechial hemorrhage.   MRA - negative for vessel occlusion. The left ICA stent is patent.  LE Doppler - Right common femoral vein acute DVT  CT Abdomen and Pelvis - 04/04/19 - No retroperitoneal hematoma;  however, concerning for possible renal and splenic infarcts.  2D Echo EF 55-60%, no vegetation seen  TEE shows aortic, tricuspid and mitral valve endocarditis with significant aortic regurg  LDL NOT CALCULATED d/t TG 258. direct LDL - 42.9  HgbA1c 5.9  SARS - negative  HIV - non reactive  SCDs for VTE prophylaxis  unknown meds prior to admission, now on aspirin 81 mg daily and Brilinta (ticagrelor) 90 mg bid for ICA stent.   P2Y12 - 115   Therapy recommendations:  Home PT, OT and Speech recommended  Disposition:  Stay in house for Abx course   Lung Nodules, likely septic emboli infective endocarditis Fever   CT chest B lung multifocal nodularity, largest superior RLL. Dif includes: PNA, COVID, septic emboli and mets. Mild cardiomegaly. Heterogenous B nephrogram. ET in mid trachea. GT in stomach. Tissue in mediastinum c/w residual thymic tissue.  WBC 19.5->21.4->26.4->25.6->29.5->22.2->15.4  TMax 100.5->afebrile  No nuchal rigidity - less likely meningitis - not LP candidate given on brilinta   Zosyn 3/25>>3/25  Vanc 3/25>>3/27, 3/30>>  Cefepime 3/25>>3/27  Cefazolin 3/27>>3/30  Rocephin 3/30>>  Blood Cx - no growth 4 days  Sputum Cx - MODERATE STAPHYLOCOCCUS AUREUS   CT Abdomen and Pelvis - 04/04/19 - No retroperitoneal hematoma; however, concerning for possible renal and splenic infarcts.  TEE aortic, tricuspid and mitral valve endocarditis with significant aortic regurg  CTVS consulted - not candidate for now, outpt follow up  DVT  LE venous doppler - right common femoral vein DVT  Not candidate for anticoagulation due to large right groin hematoma and endocarditis  IVC filter 3/26   Substance abuse with IVDU  UDS:  THC POSITIVE, Amphetamines POSITIVE  Suspicious for heroin use.   Multiple needle marks b/l UEs, L>R.   Cessation education provided  Pt stated that IVDU has been going on for 10 years and now she is willing to quit after this  dramatic event  R groin hematoma  Sheath removed post IR w/ angioseal placed  New R groin hematoma on rounds  CT pelvis large diffuse hematoma anterior R groin and upper thigh NOT extending into intra-abd retroperitoneal structures. Limited eval for pseudoaneurysm  LE pseudoaneurysm [place] - No evidence of pseudoaneurysm, or AVF.   Hgb 11.2->12.2->9.3->8.6->9.4  Not candidate for anticoagulation at this time  Hypotension . BP goal per IR x 24h 120-140  . Treated w/ phenylephrine . BP stable  . Long-term BP goal normotensive  Hyperlipidemia  LDL NOT CALCULATED d/t TG 258.   Direct LDL 42.9, at Goal LDL < 70  Now on Lipitor 80  Continue statin on d/c given ICA stent  B12 deficiency  B12 = 175  B12 supplement  Other Stroke Risk Factors  Substance abuse  Other Active Problems  Hyponatremia, resolved  Hypokalemia - 2.9 - KCL has been ordered  Thrombocytosis - 493->535->572  Hospital day # 10   Neurology will sign off. Please call with questions. Pt will follow up with stroke clinic Dr. Pearlean Brownie at [facility] in about 4 weeks. Thanks for the consult.  Marvel Plan, MD PhD Stroke Neurology 04/11/2019 12:47 PM   To contact Stroke Continuity provider, please refer to [email or web address]. After hours, contact General Neurology

## 2019-04-11 NOTE — Plan of Care (Signed)
Plan of care reviewed with pt at bedside. PICC and subclavian in place.  HR sinus tach on tele. Pt up with assist. Call bell in reach, will continue to monitor.  Problem: Education: Goal: Knowledge of disease or condition will improve Outcome: Progressing Goal: Knowledge of secondary prevention will improve Outcome: Progressing Goal: Knowledge of patient specific risk factors addressed and post discharge goals established will improve Outcome: Progressing   Problem: Coping: Goal: Will verbalize positive feelings about self Outcome: Progressing Goal: Will identify appropriate support needs Outcome: Progressing   Problem: Health Behavior/Discharge Planning: Goal: Ability to manage health-related needs will improve Outcome: Progressing   Problem: Self-Care: Goal: Ability to participate in self-care as condition permits will improve Outcome: Progressing Goal: Verbalization of feelings and concerns over difficulty with self-care will improve Outcome: Progressing Goal: Ability to communicate needs accurately will improve Outcome: Progressing   Problem: Nutrition: Goal: Risk of aspiration will decrease Outcome: Progressing   Problem: Ischemic Stroke/TIA Tissue Perfusion: Goal: Complications of ischemic stroke/TIA will be minimized Outcome: Progressing   Problem: Education: Goal: Knowledge of General Education information will improve Description: Including pain rating scale, medication(s)/side effects and non-pharmacologic comfort measures Outcome: Progressing   Problem: Health Behavior/Discharge Planning: Goal: Ability to manage health-related needs will improve Outcome: Progressing   Problem: Clinical Measurements: Goal: Ability to maintain clinical measurements within normal limits will improve Outcome: Progressing Goal: Will remain free from infection Outcome: Progressing Goal: Diagnostic test results will improve Outcome: Progressing Goal: Respiratory complications  will improve Outcome: Progressing Goal: Cardiovascular complication will be avoided Outcome: Progressing   Problem: Activity: Goal: Risk for activity intolerance will decrease Outcome: Progressing   Problem: Nutrition: Goal: Adequate nutrition will be maintained Outcome: Progressing   Problem: Coping: Goal: Level of anxiety will decrease Outcome: Progressing   Problem: Elimination: Goal: Will not experience complications related to bowel motility Outcome: Progressing Goal: Will not experience complications related to urinary retention Outcome: Progressing   Problem: Pain Managment: Goal: General experience of comfort will improve Outcome: Progressing   Problem: Safety: Goal: Ability to remain free from injury will improve Outcome: Progressing   Problem: Skin Integrity: Goal: Risk for impaired skin integrity will decrease Outcome: Progressing

## 2019-04-11 NOTE — Progress Notes (Signed)
MEWS yellow (2) d/t HR, Bodenheimer informed and something for elevated HR prn.

## 2019-04-12 ENCOUNTER — Inpatient Hospital Stay (HOSPITAL_COMMUNITY): Payer: Medicaid Other

## 2019-04-12 LAB — RAPID URINE DRUG SCREEN, HOSP PERFORMED
Amphetamines: NOT DETECTED
Barbiturates: NOT DETECTED
Benzodiazepines: POSITIVE — AB
Cocaine: NOT DETECTED
Opiates: NOT DETECTED
Tetrahydrocannabinol: POSITIVE — AB

## 2019-04-12 LAB — BASIC METABOLIC PANEL
Anion gap: 9 (ref 5–15)
BUN: <5 mg/dL — ABNORMAL LOW (ref 6–20)
CO2: 21 mmol/L — ABNORMAL LOW (ref 22–32)
Calcium: 8.2 mg/dL — ABNORMAL LOW (ref 8.9–10.3)
Chloride: 111 mmol/L (ref 98–111)
Creatinine, Ser: 0.55 mg/dL (ref 0.44–1.00)
GFR calc Af Amer: >60 mL/min (ref >60)
GFR calc non Af Amer: >60 mL/min (ref >60)
Glucose, Bld: 105 mg/dL — ABNORMAL HIGH (ref 70–99)
Potassium: 3.3 mmol/L — ABNORMAL LOW (ref 3.5–5.1)
Sodium: 141 mmol/L (ref 135–145)

## 2019-04-12 LAB — HEPATIC FUNCTION PANEL
ALT: 35 U/L (ref 0–44)
AST: 35 U/L (ref 15–41)
Albumin: 1.9 g/dL — ABNORMAL LOW (ref 3.5–5.0)
Alkaline Phosphatase: 71 U/L (ref 38–126)
Bilirubin, Direct: 0.1 mg/dL (ref 0.0–0.2)
Indirect Bilirubin: 0.5 mg/dL (ref 0.3–0.9)
Total Bilirubin: 0.6 mg/dL (ref 0.3–1.2)
Total Protein: 6.1 g/dL — ABNORMAL LOW (ref 6.5–8.1)

## 2019-04-12 LAB — CBC
HCT: 30.9 % — ABNORMAL LOW (ref 36.0–46.0)
Hemoglobin: 9.9 g/dL — ABNORMAL LOW (ref 12.0–15.0)
MCH: 27.2 pg (ref 26.0–34.0)
MCHC: 32 g/dL (ref 30.0–36.0)
MCV: 84.9 fL (ref 80.0–100.0)
Platelets: 631 10*3/uL — ABNORMAL HIGH (ref 150–400)
RBC: 3.64 MIL/uL — ABNORMAL LOW (ref 3.87–5.11)
RDW: 19.4 % — ABNORMAL HIGH (ref 11.5–15.5)
WBC: 15 10*3/uL — ABNORMAL HIGH (ref 4.0–10.5)
nRBC: 0 % (ref 0.0–0.2)

## 2019-04-12 LAB — VANCOMYCIN, TROUGH: Vancomycin Tr: 14 ug/mL — ABNORMAL LOW (ref 15–20)

## 2019-04-12 LAB — MAGNESIUM: Magnesium: 1.7 mg/dL (ref 1.7–2.4)

## 2019-04-12 LAB — VANCOMYCIN, PEAK: Vancomycin Pk: 46 ug/mL — ABNORMAL HIGH (ref 30–40)

## 2019-04-12 MED ORDER — POTASSIUM CHLORIDE CRYS ER 20 MEQ PO TBCR
40.0000 meq | EXTENDED_RELEASE_TABLET | Freq: Once | ORAL | Status: AC
  Administered 2019-04-12: 40 meq via ORAL
  Filled 2019-04-12: qty 2

## 2019-04-12 MED ORDER — DILTIAZEM HCL 25 MG/5ML IV SOLN
10.0000 mg | Freq: Once | INTRAVENOUS | Status: AC
  Administered 2019-04-12: 10 mg via INTRAVENOUS
  Filled 2019-04-12: qty 5

## 2019-04-12 MED ORDER — VANCOMYCIN HCL 750 MG/150ML IV SOLN
750.0000 mg | Freq: Three times a day (TID) | INTRAVENOUS | Status: DC
  Administered 2019-04-12 – 2019-04-13 (×2): 750 mg via INTRAVENOUS
  Filled 2019-04-12 (×3): qty 150

## 2019-04-12 MED ORDER — LORAZEPAM 2 MG/ML IJ SOLN
1.0000 mg | Freq: Once | INTRAMUSCULAR | Status: AC
  Administered 2019-04-12: 1 mg via INTRAVENOUS

## 2019-04-12 MED ORDER — QUETIAPINE FUMARATE 25 MG PO TABS
25.0000 mg | ORAL_TABLET | Freq: Once | ORAL | Status: AC
  Administered 2019-04-12: 25 mg via ORAL
  Filled 2019-04-12: qty 1

## 2019-04-12 MED ORDER — METOPROLOL TARTRATE 5 MG/5ML IV SOLN
2.5000 mg | Freq: Once | INTRAVENOUS | Status: AC
  Administered 2019-04-12: 2.5 mg via INTRAVENOUS
  Filled 2019-04-12: qty 5

## 2019-04-12 MED ORDER — LORAZEPAM 2 MG/ML IJ SOLN
0.5000 mg | Freq: Four times a day (QID) | INTRAMUSCULAR | Status: AC | PRN
  Administered 2019-04-12 – 2019-04-13 (×3): 0.5 mg via INTRAVENOUS
  Filled 2019-04-12 (×4): qty 1

## 2019-04-12 MED ORDER — METOPROLOL TARTRATE 12.5 MG HALF TABLET
12.5000 mg | ORAL_TABLET | Freq: Two times a day (BID) | ORAL | Status: DC
  Administered 2019-04-12 – 2019-04-27 (×31): 12.5 mg via ORAL
  Filled 2019-04-12 (×32): qty 1

## 2019-04-12 MED ORDER — MAGNESIUM SULFATE 2 GM/50ML IV SOLN
2.0000 g | Freq: Once | INTRAVENOUS | Status: AC
  Administered 2019-04-12: 2 g via INTRAVENOUS
  Filled 2019-04-12: qty 50

## 2019-04-12 MED ORDER — QUETIAPINE FUMARATE 25 MG PO TABS
25.0000 mg | ORAL_TABLET | Freq: Every day | ORAL | Status: DC
  Administered 2019-04-12 – 2019-04-27 (×16): 25 mg via ORAL
  Filled 2019-04-12 (×16): qty 1

## 2019-04-12 MED ORDER — LORAZEPAM 2 MG/ML IJ SOLN
1.0000 mg | Freq: Once | INTRAMUSCULAR | Status: AC
  Administered 2019-04-12: 1 mg via INTRAVENOUS
  Filled 2019-04-12: qty 1

## 2019-04-12 NOTE — Progress Notes (Addendum)
PROGRESS NOTE  Lauren Reyes YPP:[id number] DOB: 1990-10-15 DOA: 04/01/2019 PCP: Patient, No Pcp Per  Brief summary: Lauren Reyes is a 29 y.o. female with unknown past medical history, possible heroin abuse presents to the emergency department as a code stroke with aphasia and right-sided weakness.  [place] PD called to the hotel room where she was staying as she had overstated her visit, patient was  Found by PD talking on the phone and suddenly started to slur her speech, the right facial droop and had difficulty talking.  When EMS arrived patient still communicating but slowly progressively got worse.    Patient was admitted to neurology ICU intubated after  Thrombectomy She underwent thrombectomy for Left MCA m3 occlusion with recanalization complicated by dissection/pseudoanerusym of left ICA s/p stent.  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 Servando Snare:  patient eventually will require aortic valve replacement, however this is complicated by recent stroke and recent stent on aspirin and Brilinta.  -per Dr. Pia Mau consider surgery if patient developed worsening of heart failure syndrome and she has to be off Brilinta for 7 days -Dr. Pia Mau also requested psychiatric treatment program for IV drug use to be established before considering cardiac surgery intervention    small infarct of left MCA territory. TPA was deferred as concern for septic emboli. -she underwent a left common carotid arteriogram followed by complete revascularization of occluded M3 region of inferior division of left MCA  -S/p Placement of a 4.5 mm x 24m pipeline flow diverter at site of focal dissection associated with a small pseudoaneurysm of distal cervical LT ICA.  -She is on aspirin and Brilinta -a1c 5.9, Vldl 52 -She is on Lipitor 80 mg daily  Dyslipidemia: With low HDL less than 10, high VLDL at 52, high triglyceride at 258 -On Lipitor  Sinus tachycardia -drug withdrawal vs deconditioning -TSH 0.8 -Schedule low-dose Lopressor with holding parameters  Right common femoral vein DVT -Not a candidate for anticoagulation due to large right groin hematoma post procedure and endocarditis -Status post IVC filter placement March 26 by IR  large right groin hematoma post procedure S/p 36F angioseal for hemostasis at the right CFA access site. Monitor CBC  Blood loss anemia, likely from right groin hematoma Status post PRBC transfusion x2 on March 27 GI Dr GPenelope Coopconsulted on March 27 who do not think patient has any signs of GI bleed Monitor CBC  B12 deficiency Replace B12  Hypokalemia/hypomagnesemia, remain low, continue replace, recheck in the morning  Hyponatremia  present on admission, normalized  Hepatitis C antibody positive, viral load negative HIV screening negative RPR screening negative  Polysubstance abuse UDS on presentation positive for  THC amphetamine Suspect IV drug use with heroin, multiple needle marks bilateral upper extremities Patient state IV drug use ongoing for many years now she is waiting to quit   Anxiety versus drug withdrawal -She has an episode of crying, acting erratic on [place]ivia 4 a.m. -Appear responding to as needed Ativan -Add-on UDS  4:47 PM RN report patient become more confused with increased slurred speech Will order stat CT head  6:33pm addendum: Ct no acute interval changes "Evolving areas of recent infarction better seen on prior MRI. Petechial hemorrhage is not visible. There is no discrete hematoma."  Deconditioning Per PT: She need assistant for bed mobility and transfers, she needs supervision by one person hand-held for ambulation and gait, she was able to ambulate 80 feet on [place] 2nd,  Gait Pattern/deviations: Step-through pattern;Decreased stride length;Wide base of support;Trunk flexed General Gait Details: Mild unsteadiness, pt reaching for furniture required HHA to improve balance.  HR 131 bpm at it's highest.  DVT Prophylaxis:scd's  Code Status: full  Family Communication: patient   Disposition Plan:    Patient came from:         home                                                                                                 Anticipated d/c place: TBD  Barriers to d/c OR conditions which need to be met to effect a safe d/c:  Infective endocarditis ,h/o IV drug use, currently no insurance, Medicaid potential   Consultants:  Patient was admitted to neuro ICU on presentation, transferred to hospitalist service on [place] 3  Critical care  GI  CR  Thoracic surgery Dr. Servando Snare  Infectious disease  Cardiology for TEE   Interventional neurologist Dr. Estanislado Pandy for mechanical thrombectomy  IR for IVC filter placement  Procedures:  Left subclavian central line placement March 25 Right arm single-lumen PICC line placement March 29 Intubation March 24,  extubation on March 27 TEE March 30  Mechanical thrombectomy March 24  ICA stenting March 24 Status post IVC filter placement March 26 by IR    Antibiotics:  Vanco  Rocephin   Objective: BP 116/79 (BP Location: Left Arm)   Pulse (!) 117   Temp 98.2 F (36.8 C) (Oral)   Resp 18   Ht '5\' 5"'$  (1.651 m)   Wt 72.3 kg   LMP 02/05/2019 (Approximate) Comment: Neg Preg Test on 04/03/19  SpO2 99%   BMI 26.52 kg/m   Intake/Output Summary (Last 24 hours) at 04/12/2019 1111 Last data filed at 04/12/2019 6606 Gross per 24 hour  Intake 1593 ml  Output 700 ml  Net 893 ml   Filed Weights   04/01/19 1900 04/01/19 2023  Weight: 72.3 kg 72.3 kg    Exam: Patient is examined daily including today on 04/12/2019, exams remain the same as of yesterday except that has changed    General:  dysarthria  Cardiovascular: Sinus tachycardia  Respiratory:  CTABL  Abdomen: Soft/ND/NT, positive BS  Musculoskeletal: No Edema  Neuro: alert, oriented   Data Reviewed: Basic Metabolic Panel: Recent Labs  Lab 04/05/19 1419 04/08/19 0530 04/11/19 0431 04/12/19 0423  NA  --  137 138 141  K 3.4* 3.0* 2.9* 3.3*  CL  --  104 107 111  CO2  --  24 22 21*  GLUCOSE  --  105* 109* 105*  BUN  --  5* <5* <5*  CREATININE  --  0.49 0.41* 0.55  CALCIUM  --  7.7* 7.9* 8.2*  MG  --   --   --  1.7   Liver Function Tests: Recent Labs  Lab 04/12/19 0423  AST 35  ALT 35  ALKPHOS 71  BILITOT 0.6  PROT 6.1*  ALBUMIN 1.9*   No results for input(s): LIPASE, AMYLASE in the last 168 hours. No results for input(s): AMMONIA in the last 168 hours. CBC: Recent Labs  Lab 04/06/19 0526 04/06/19 0526 04/07/19 0500 04/07/19 0500 04/08/19 0530 04/09/19 0535 04/10/19 0512 04/11/19 0431 04/12/19 0423  WBC 22.2*   < > 21.5*   < > 18.7* 16.3* 15.8* 15.4* 15.0*  NEUTROABS 17.1*  --  16.0*  --  14.7* 12.6* 12.5*  --   --   HGB 8.6*   < > 9.5*   < > 9.2* 9.1* 9.0* 9.4* 9.9*  HCT 25.4*   < > 28.4*   < >  27.9* 28.1* 28.0* 29.2* 30.9*  MCV 80.1   < > 79.8*   < > 81.1 82.2 83.8 84.1 84.9  PLT 403*   < > 459*   < > 478* 493* 535* 572* 631*   < > = values in this interval not displayed.   Cardiac Enzymes:   No results for input(s): CKTOTAL, CKMB, CKMBINDEX, TROPONINI in the last 168 hours. BNP (last 3 results) No results for input(s): BNP in the last 8760 hours.  ProBNP (last 3 results) No results for input(s): PROBNP in the last 8760 hours.  CBG: Recent Labs  Lab 04/07/19 1955  GLUCAP 93    No results found for this or any previous visit (from the past 240 hour(s)).   Studies: No results found.  Scheduled Meds: . aspirin  81 mg Oral Daily   Or  . aspirin  81 mg Per Tube Daily  . atorvastatin  80 mg Per Tube q1800  . chlorhexidine gluconate (MEDLINE KIT)  15 mL Mouth Rinse BID  . Chlorhexidine Gluconate Cloth  6 each Topical Daily  . feeding supplement (ENSURE ENLIVE)  237 mL Oral TID BM  . metoprolol tartrate  12.5 mg Oral BID  . potassium chloride  40 mEq Oral BID  . sodium chloride flush  10-40 mL Intracatheter Q12H  . ticagrelor  90 mg Oral BID  . vitamin B-12  1,000 mcg Per Tube Daily   Or  . vitamin B-12  1,000 mcg Oral Daily    Continuous Infusions: . sodium chloride Stopped (04/06/19 0609)  . cefTRIAXone (ROCEPHIN)  IV 2 g (04/12/19 0918)  . magnesium sulfate bolus IVPB 2 g (04/12/19 1057)  . vancomycin 1,000 mg (04/12/19 0533)     Time spent: 18mns I have personally reviewed and interpreted on  04/12/2019 daily labs,  imagings as discussed above under date review session and assessment and plans.  I reviewed all nursing notes, pharmacy notes, consultant notes,  vitals, pertinent old records  I have discussed plan of care as described above with RN ,  patient  on 04/12/2019   Florencia Reasons MD, PhD, FACP  [facility]  Available via Epic secure chat 7am-7pm for nonurgent issues Please page for urgent issues, pager number available through [email or web address]  .   04/12/2019, 11:11 AM  LOS: 11 days

## 2019-04-12 NOTE — Progress Notes (Signed)
Pt's MEWS yellow 3 d/t HR and RR. Bodenheimer inform and Hydralazine requested. RRT Melanie collaborated with d/t pt's HR ans MEWS, chrg nurse informed as well. Pt;s prn Ativan and scheduled metop had no positive affect on pt's HR or RR.Marland Kitchen

## 2019-04-12 NOTE — Progress Notes (Signed)
Pt's HR has been elevated since the start of this shift and MEWS yellow. Bodenheimer informed and something to treat HR requested.

## 2019-04-12 NOTE — Progress Notes (Signed)
Pharmacy Antibiotic Note  Lauren Reyes is a 29 y.o. female admitted on 04/01/2019 as a code stroke with aphasia and right-sided weakness. Pharmacy has been consulted for vancomycin dosing.  TEE on 3/30 revealed severe endocarditis involving aortic, mitral, and tricuspid valves with severe aortic regurgitation. Patient's condition is further complicated by septic emboli to her brain, lungs, and spleen. Respiratory culture grew MSSA, however, recent blood cultures are negative. Appears to have received at least one dose of cefazolin prior to culture draw, but will plan to treat with vancomycin and ceftriaxone to cover MSSA and other causes of culture-negative endocarditis.  Afebrile, WBC decreasing 15, Scr stable.  4/4 VP = 46 4/4 VT = 14 Calculated AUC 758.5 and above goal  Since patient has concern for cerebral emboli, will target trough of 15-20.  Plan: -Decrease vancomcyin to 750 mg q8h -Estimated AUC 567.3. While this AUC is higher than normal target, will continue this dose since there is concern for CNS involvement. -Goal trough 15-20. -Continue ceftriaxone IV 2g q12 her ID -Recheck peak and trough levels at steady state. -Monitor renal function, clinical improvement, WBC, and temperature  Height: 5\' 5"  (165.1 cm) Weight: 72.3 kg (159 lb 6.3 oz) IBW/kg (Calculated) : 57  Temp (24hrs), Avg:98.1 F (36.7 C), Min:97.7 F (36.5 C), Max:98.3 F (36.8 C)  Recent Labs  Lab 04/08/19 0530 04/09/19 0535 04/09/19 2013 04/10/19 0512 04/11/19 0431 04/12/19 0423 04/12/19 0740 04/12/19 1335  WBC 18.7* 16.3*  --  15.8* 15.4* 15.0*  --   --   CREATININE 0.49  --   --   --  0.41* 0.55  --   --   VANCOTROUGH  --   --   --  8*  --   --   --  14*  VANCOPEAK  --   --  27*  --   --   --  46*  --     Estimated Creatinine Clearance: 102.4 mL/min (by C-G formula based on SCr of 0.55 mg/dL).    No Known Allergies  Antimicrobials this admission: Zosyn 3/25 >> 3/25 Cefepime 3/25 >>  3/27 Ancef 3/27 >> 3/30 Vanc 3/25 >> 3/27; restarted 3/30 >>  Unasyn 3/30>>3/31 Ceftriaxone 3/31>>(5/9)  Dose adjustments this admission: N/A  Microbiology results: 3/24 COVID/Flu >> neg 3/25 RCx >> moderate MSSA 3/25 MRSA PCR >> negative 3/25 BCx >> negative   Thank you for involving pharmacy in this patient's care.  4/25, PharmD, BCPS Clinical Pharmacist Clinical phone for 04/12/2019 until 3p is [phone] 04/12/2019 2:59 PM  **Pharmacist phone directory can be found on [email or web address] listed under [facility]**

## 2019-04-12 NOTE — Plan of Care (Signed)
Plan of care reviewed with pt at bedside.  In the afternoon pt became very anxious, sweaty, c/o headache, and felt like she couldn't breathe. Pt ripped off telemetry and clothes. MD was made aware. Medication given per orders. After first dose of ativan, no relief was noted. Second dose given. Per telemetry, pt had a ventricular stand still right around that time. Will continue to monitor. Pt is more calm now. Call bell in reach. Alarms activated.  Problem: Education: Goal: Knowledge of disease or condition will improve 04/12/2019 1617 by Juliane Poot, RN Outcome: Progressing 04/12/2019 1106 by Juliane Poot, RN Outcome: Progressing Goal: Knowledge of secondary prevention will improve 04/12/2019 1617 by Juliane Poot, RN Outcome: Progressing 04/12/2019 1106 by Juliane Poot, RN Outcome: Progressing Goal: Knowledge of patient specific risk factors addressed and post discharge goals established will improve 04/12/2019 1617 by Juliane Poot, RN Outcome: Progressing 04/12/2019 1106 by Juliane Poot, RN Outcome: Progressing   Problem: Coping: Goal: Will verbalize positive feelings about self 04/12/2019 1617 by Juliane Poot, RN Outcome: Progressing 04/12/2019 1106 by Juliane Poot, RN Outcome: Progressing Goal: Will identify appropriate support needs 04/12/2019 1617 by Juliane Poot, RN Outcome: Progressing 04/12/2019 1106 by Juliane Poot, RN Outcome: Progressing   Problem: Health Behavior/Discharge Planning: Goal: Ability to manage health-related needs will improve 04/12/2019 1617 by Juliane Poot, RN Outcome: Progressing 04/12/2019 1106 by Juliane Poot, RN Outcome: Progressing   Problem: Self-Care: Goal: Ability to participate in self-care as condition permits will improve 04/12/2019 1617 by Juliane Poot, RN Outcome: Progressing 04/12/2019 1106 by Juliane Poot, RN Outcome: Progressing Goal: Verbalization of feelings and concerns over difficulty with self-care will  improve 04/12/2019 1617 by Juliane Poot, RN Outcome: Progressing 04/12/2019 1106 by Juliane Poot, RN Outcome: Progressing Goal: Ability to communicate needs accurately will improve 04/12/2019 1617 by Juliane Poot, RN Outcome: Progressing 04/12/2019 1106 by Juliane Poot, RN Outcome: Progressing   Problem: Nutrition: Goal: Risk of aspiration will decrease 04/12/2019 1617 by Juliane Poot, RN Outcome: Progressing 04/12/2019 1106 by Juliane Poot, RN Outcome: Progressing   Problem: Ischemic Stroke/TIA Tissue Perfusion: Goal: Complications of ischemic stroke/TIA will be minimized 04/12/2019 1617 by Juliane Poot, RN Outcome: Progressing 04/12/2019 1106 by Juliane Poot, RN Outcome: Progressing   Problem: Education: Goal: Knowledge of General Education information will improve Description: Including pain rating scale, medication(s)/side effects and non-pharmacologic comfort measures 04/12/2019 1617 by Juliane Poot, RN Outcome: Progressing 04/12/2019 1106 by Juliane Poot, RN Outcome: Progressing   Problem: Health Behavior/Discharge Planning: Goal: Ability to manage health-related needs will improve 04/12/2019 1617 by Juliane Poot, RN Outcome: Progressing 04/12/2019 1106 by Juliane Poot, RN Outcome: Progressing   Problem: Clinical Measurements: Goal: Ability to maintain clinical measurements within normal limits will improve 04/12/2019 1617 by Juliane Poot, RN Outcome: Progressing 04/12/2019 1106 by Juliane Poot, RN Outcome: Progressing Goal: Will remain free from infection 04/12/2019 1617 by Juliane Poot, RN Outcome: Progressing 04/12/2019 1106 by Juliane Poot, RN Outcome: Progressing Goal: Diagnostic test results will improve 04/12/2019 1617 by Juliane Poot, RN Outcome: Progressing 04/12/2019 1106 by Juliane Poot, RN Outcome: Progressing Goal: Respiratory complications will improve 04/12/2019 1617 by Juliane Poot, RN Outcome: Progressing 04/12/2019  1106 by Juliane Poot, RN Outcome: Progressing Goal: Cardiovascular complication will be avoided 04/12/2019 1617 by Juliane Poot, RN Outcome: Progressing 04/12/2019 1106 by Marlane Mingle  J, RN Outcome: Progressing   Problem: Activity: Goal: Risk for activity intolerance will decrease 04/12/2019 1617 by Marty Heck, RN Outcome: Progressing 04/12/2019 1106 by Marty Heck, RN Outcome: Progressing   Problem: Nutrition: Goal: Adequate nutrition will be maintained 04/12/2019 1617 by Marty Heck, RN Outcome: Progressing 04/12/2019 1106 by Marty Heck, RN Outcome: Progressing   Problem: Coping: Goal: Level of anxiety will decrease 04/12/2019 1617 by Marty Heck, RN Outcome: Progressing 04/12/2019 1106 by Marty Heck, RN Outcome: Progressing   Problem: Elimination: Goal: Will not experience complications related to bowel motility 04/12/2019 1617 by Marty Heck, RN Outcome: Progressing 04/12/2019 1106 by Marty Heck, RN Outcome: Progressing Goal: Will not experience complications related to urinary retention 04/12/2019 1617 by Marty Heck, RN Outcome: Progressing 04/12/2019 1106 by Marty Heck, RN Outcome: Progressing   Problem: Pain Managment: Goal: General experience of comfort will improve 04/12/2019 1617 by Marty Heck, RN Outcome: Progressing 04/12/2019 1106 by Marty Heck, RN Outcome: Progressing   Problem: Safety: Goal: Ability to remain free from injury will improve 04/12/2019 1617 by Marty Heck, RN Outcome: Progressing 04/12/2019 1106 by Marty Heck, RN Outcome: Progressing   Problem: Skin Integrity: Goal: Risk for impaired skin integrity will decrease 04/12/2019 1617 by Marty Heck, RN Outcome: Progressing 04/12/2019 1106 by Marty Heck, RN Outcome: Progressing

## 2019-04-12 NOTE — Plan of Care (Signed)
Plan of care reviewed with pt at bedside. Pt anxious and restless. Triple lumen subclavian pulled by IV team. MD notified about HR sustaining in the 120s, prn medication requested.  Call bell in reach. Alarms activated. Pt stable at this time. Will continue to monitor.  Problem: Education: Goal: Knowledge of disease or condition will improve Outcome: Progressing Goal: Knowledge of secondary prevention will improve Outcome: Progressing Goal: Knowledge of patient specific risk factors addressed and post discharge goals established will improve Outcome: Progressing   Problem: Coping: Goal: Will verbalize positive feelings about self Outcome: Progressing Goal: Will identify appropriate support needs Outcome: Progressing   Problem: Health Behavior/Discharge Planning: Goal: Ability to manage health-related needs will improve Outcome: Progressing   Problem: Self-Care: Goal: Ability to participate in self-care as condition permits will improve Outcome: Progressing Goal: Verbalization of feelings and concerns over difficulty with self-care will improve Outcome: Progressing Goal: Ability to communicate needs accurately will improve Outcome: Progressing   Problem: Nutrition: Goal: Risk of aspiration will decrease Outcome: Progressing   Problem: Ischemic Stroke/TIA Tissue Perfusion: Goal: Complications of ischemic stroke/TIA will be minimized Outcome: Progressing   Problem: Education: Goal: Knowledge of General Education information will improve Description: Including pain rating scale, medication(s)/side effects and non-pharmacologic comfort measures Outcome: Progressing   Problem: Health Behavior/Discharge Planning: Goal: Ability to manage health-related needs will improve Outcome: Progressing   Problem: Clinical Measurements: Goal: Ability to maintain clinical measurements within normal limits will improve Outcome: Progressing Goal: Will remain free from infection Outcome:  Progressing Goal: Diagnostic test results will improve Outcome: Progressing Goal: Respiratory complications will improve Outcome: Progressing Goal: Cardiovascular complication will be avoided Outcome: Progressing   Problem: Activity: Goal: Risk for activity intolerance will decrease Outcome: Progressing   Problem: Nutrition: Goal: Adequate nutrition will be maintained Outcome: Progressing   Problem: Coping: Goal: Level of anxiety will decrease Outcome: Progressing   Problem: Elimination: Goal: Will not experience complications related to bowel motility Outcome: Progressing Goal: Will not experience complications related to urinary retention Outcome: Progressing   Problem: Pain Managment: Goal: General experience of comfort will improve Outcome: Progressing   Problem: Safety: Goal: Ability to remain free from injury will improve Outcome: Progressing   Problem: Skin Integrity: Goal: Risk for impaired skin integrity will decrease Outcome: Progressing

## 2019-04-13 DIAGNOSIS — R0682 Tachypnea, not elsewhere classified: Secondary | ICD-10-CM

## 2019-04-13 DIAGNOSIS — R4781 Slurred speech: Secondary | ICD-10-CM

## 2019-04-13 DIAGNOSIS — D649 Anemia, unspecified: Secondary | ICD-10-CM

## 2019-04-13 DIAGNOSIS — F419 Anxiety disorder, unspecified: Secondary | ICD-10-CM

## 2019-04-13 DIAGNOSIS — J189 Pneumonia, unspecified organism: Secondary | ICD-10-CM

## 2019-04-13 DIAGNOSIS — E8770 Fluid overload, unspecified: Secondary | ICD-10-CM

## 2019-04-13 LAB — CBC
HCT: 32.9 % — ABNORMAL LOW (ref 36.0–46.0)
Hemoglobin: 10.3 g/dL — ABNORMAL LOW (ref 12.0–15.0)
MCH: 26.8 pg (ref 26.0–34.0)
MCHC: 31.3 g/dL (ref 30.0–36.0)
MCV: 85.7 fL (ref 80.0–100.0)
Platelets: 665 10*3/uL — ABNORMAL HIGH (ref 150–400)
RBC: 3.84 MIL/uL — ABNORMAL LOW (ref 3.87–5.11)
RDW: 20.7 % — ABNORMAL HIGH (ref 11.5–15.5)
WBC: 13.5 10*3/uL — ABNORMAL HIGH (ref 4.0–10.5)
nRBC: 0 % (ref 0.0–0.2)

## 2019-04-13 LAB — BASIC METABOLIC PANEL
Anion gap: 8 (ref 5–15)
BUN: 6 mg/dL (ref 6–20)
CO2: 22 mmol/L (ref 22–32)
Calcium: 8.5 mg/dL — ABNORMAL LOW (ref 8.9–10.3)
Chloride: 111 mmol/L (ref 98–111)
Creatinine, Ser: 0.68 mg/dL (ref 0.44–1.00)
GFR calc Af Amer: >60 mL/min (ref >60)
GFR calc non Af Amer: >60 mL/min (ref >60)
Glucose, Bld: 100 mg/dL — ABNORMAL HIGH (ref 70–99)
Potassium: 4 mmol/L (ref 3.5–5.1)
Sodium: 141 mmol/L (ref 135–145)

## 2019-04-13 LAB — MAGNESIUM: Magnesium: 2 mg/dL (ref 1.7–2.4)

## 2019-04-13 MED ORDER — VANCOMYCIN HCL IN DEXTROSE 1-5 GM/200ML-% IV SOLN
1000.0000 mg | Freq: Three times a day (TID) | INTRAVENOUS | Status: DC
  Administered 2019-04-13 – 2019-04-14 (×5): 1000 mg via INTRAVENOUS
  Filled 2019-04-13 (×7): qty 200

## 2019-04-13 MED ORDER — LORAZEPAM 2 MG/ML IJ SOLN
1.0000 mg | INTRAMUSCULAR | Status: DC | PRN
  Administered 2019-04-13 – 2019-04-14 (×2): 1 mg via INTRAVENOUS
  Filled 2019-04-13 (×2): qty 1

## 2019-04-13 MED ORDER — BOOST / RESOURCE BREEZE PO LIQD CUSTOM
1.0000 | Freq: Three times a day (TID) | ORAL | Status: DC
  Administered 2019-04-13 – 2019-04-20 (×8): 1 via ORAL

## 2019-04-13 NOTE — Progress Notes (Signed)
Physical Therapy Treatment Patient Details Name: Lauren Reyes MRN: [id number] DOB: [birth year] Today's Date: 04/13/2019    History of Present Illness 29 year old female with suspected IV drug abuse who was being interviewed by police tonight and developed right-sided facial weakness, aphasia, and R weakness.  She was brought by EMS to the emergency room work-up revealed occluded left MCA. Revascularization performed by IR. CT head and neck demonstrate multiple small cavitary abscesses in the upper lobes bilaterally. IVC filter placement for RLE DVT on 3/26.    PT Comments    Patient seen for mobility progression. Pt appears fatigued but agreeable to participate. Pt tolerated short distance gait in room. HR 110s and RR 30s-40s with shallow breaths at times. Pursed lip breathing assisted in bringing RR into 20s and pt appeared more comfortable before ambulating. PT will continue to follow acutely and progress as tolerated.     Follow Up Recommendations  Home health PT     Equipment Recommendations  Other (comment)(TBD pending progress)    Recommendations for Other Services       Precautions / Restrictions Precautions Precautions: Fall Restrictions Weight Bearing Restrictions: No    Mobility  Bed Mobility Overal bed mobility: Independent Bed Mobility: Supine to Sit;Sit to Supine              Transfers Overall transfer level: Needs assistance Equipment used: None Transfers: Sit to/from Stand Sit to Stand: Supervision         General transfer comment: Supervision for safety from EOB and commode using grab bar  Ambulation/Gait Ambulation/Gait assistance: Min guard;Min assist Gait Distance (Feet): (30 ft total) Assistive device: None(assist at trunk with gait belt; pt "furniture walking") Gait Pattern/deviations: Step-through pattern;Decreased stride length;Trunk flexed;Narrow base of support Gait velocity: decreased   General Gait Details: assistance for  balance and cues for PLB to assist in decreased RR; WOB with mobility and HR in 120s   Stairs             Wheelchair Mobility    Modified Rankin (Stroke Patients Only) Modified Rankin (Stroke Patients Only) Pre-Morbid Rankin Score: No symptoms Modified Rankin: Moderate disability     Balance Overall balance assessment: Needs assistance Sitting-balance support: Feet supported Sitting balance-Leahy Scale: Normal       Standing balance-Leahy Scale: Poor Standing balance comment: pt reaching single UE support while ambulating; LOB while standing at sink requiring assist to recover                            Cognition Arousal/Alertness: Awake/alert Behavior During Therapy: Flat affect Overall Cognitive Status: Impaired/Different from baseline Area of Impairment: Following commands;Safety/judgement;Problem solving                       Following Commands: Follows one step commands with increased time     Problem Solving: Decreased initiation;Requires verbal cues        Exercises      General Comments General comments (skin integrity, edema, etc.): RR in 30s-40s and HR 110s-upper 120s during session(mother present with lunch for patient )      Pertinent Vitals/Pain Pain Assessment: No/denies pain    Home Living                      Prior Function            PT Goals (current goals can now be found in the care plan section)  Progress towards PT goals: Progressing toward goals    Frequency    Min 4X/week      PT Plan Current plan remains appropriate    Co-evaluation              AM-PAC PT "6 Clicks" Mobility   Outcome Measure  Help needed turning from your back to your side while in a flat bed without using bedrails?: None Help needed moving from lying on your back to sitting on the side of a flat bed without using bedrails?: None Help needed moving to and from a bed to a chair (including a wheelchair)?:  None Help needed standing up from a chair using your arms (e.g., wheelchair or bedside chair)?: None Help needed to walk in hospital room?: A Little Help needed climbing 3-5 steps with a railing? : A Little 6 Click Score: 22    End of Session Equipment Utilized During Treatment: Gait belt Activity Tolerance: Patient limited by fatigue Patient left: with call bell/phone within reach;in bed;with nursing/sitter in room;with family/visitor present Nurse Communication: Mobility status PT Visit Diagnosis: Muscle weakness (generalized) (M62.81);Other symptoms and signs involving the nervous system (R29.898)     Time: 6629-4765 PT Time Calculation (min) (ACUTE ONLY): 23 min  Charges:  $Gait Training: 8-22 mins $Therapeutic Activity: 8-22 mins                     Earney Navy, PTA Acute Rehabilitation Services Pager: [phone] Office: [phone]     Darliss Cheney 04/13/2019, 3:43 PM

## 2019-04-13 NOTE — Progress Notes (Signed)
2L  O2 admin to possibly help with pt high RR and HR (anxiety)

## 2019-04-13 NOTE — Progress Notes (Signed)
Nutrition Follow-up  DOCUMENTATION CODES:   Not applicable  INTERVENTION:  Please obtain updated weight.   Discontinue Ensure Enlive po TID, each supplement provides 350 kcal and 20 grams of protein  Boost Breeze po TID, each supplement provides 250 kcal and 9 grams of protein   NUTRITION DIAGNOSIS:   Increased nutrient needs related to acute illness as evidenced by estimated needs.  Ongoing.  GOAL:   Patient will meet greater than or equal to 90% of their needs  Progressing.   MONITOR:   I & O's, Labs, Supplement acceptance, PO intake, Weight trends  REASON FOR ASSESSMENT:   Ventilator    ASSESSMENT:   Pt with suspected IV drug abuse who while being interviewed by police developed left-sided weakness admitted with L MCA s/p IR for revascularization and L ICA stent.  3/24 - OG tube placed, intubated 3/27 - OG removed, extubated 3/29 - Regular diet ordered  Discussed pt with RN.  Pt very lethargic during RD exam and difficult to arouse. Pt unable to answer RD questions at this time.    PO Intake: 0-75% x last 8 recorded meals (25% average intake)  Pt currently receiving Ensure Enlive po TID. Per RN, pt has been refusing these. RD will order Boost Breeze in hopes of increasing patient's protein/calorie intake.   Medications reviewed and include: Klor-Con, vitamin B12, IV abx  Labs reviewed.  I/O: +11,655.67ml since admit  Diet Order:   Diet Order            Diet regular Room service appropriate? Yes; Fluid consistency: Thin  Diet effective now              EDUCATION NEEDS:   No education needs have been identified at this time  Skin:  Skin Assessment: Reviewed RN Assessment  Last BM:  4/4  Height:   Ht Readings from Last 1 Encounters:  04/01/19 5\' 5"  (1.651 m)    Weight:   Wt Readings from Last 1 Encounters:  04/01/19 72.3 kg    BMI:  Body mass index is 26.52 kg/m.  Estimated Nutritional Needs:   Kcal:  1900-2100  Protein:   100-115 grams  Fluid:  > 1.9 L/day   04/03/19, MS, RD, LDN RD pager number and weekend/on-call pager number located in Amion.

## 2019-04-13 NOTE — Plan of Care (Signed)
  Problem: Education: Goal: Knowledge of disease or condition will improve Outcome: Progressing Goal: Knowledge of secondary prevention will improve Outcome: Progressing Goal: Knowledge of patient specific risk factors addressed and post discharge goals established will improve Outcome: Progressing   Problem: Coping: Goal: Will verbalize positive feelings about self Outcome: Progressing Goal: Will identify appropriate support needs Outcome: Progressing   Problem: Health Behavior/Discharge Planning: Goal: Ability to manage health-related needs will improve Outcome: Progressing   Problem: Self-Care: Goal: Ability to participate in self-care as condition permits will improve Outcome: Progressing Goal: Verbalization of feelings and concerns over difficulty with self-care will improve Outcome: Progressing Goal: Ability to communicate needs accurately will improve Outcome: Progressing   Problem: Self-Care: Goal: Ability to participate in self-care as condition permits will improve Outcome: Progressing Goal: Verbalization of feelings and concerns over difficulty with self-care will improve Outcome: Progressing Goal: Ability to communicate needs accurately will improve Outcome: Progressing   Problem: Nutrition: Goal: Risk of aspiration will decrease Outcome: Progressing   Problem: Ischemic Stroke/TIA Tissue Perfusion: Goal: Complications of ischemic stroke/TIA will be minimized Outcome: Progressing   Problem: Education: Goal: Knowledge of General Education information will improve Description: Including pain rating scale, medication(s)/side effects and non-pharmacologic comfort measures Outcome: Progressing   Problem: Clinical Measurements: Goal: Ability to maintain clinical measurements within normal limits will improve Outcome: Progressing Goal: Will remain free from infection Outcome: Progressing Goal: Diagnostic test results will improve Outcome: Progressing Goal:  Respiratory complications will improve Outcome: Progressing Goal: Cardiovascular complication will be avoided Outcome: Progressing   Problem: Activity: Goal: Risk for activity intolerance will decrease Outcome: Progressing   Problem: Nutrition: Goal: Adequate nutrition will be maintained Outcome: Progressing   Problem: Coping: Goal: Level of anxiety will decrease Outcome: Progressing   Problem: Elimination: Goal: Will not experience complications related to bowel motility Outcome: Progressing Goal: Will not experience complications related to urinary retention Outcome: Progressing   Problem: Pain Managment: Goal: General experience of comfort will improve Outcome: Progressing   Problem: Safety: Goal: Ability to remain free from injury will improve Outcome: Progressing   Problem: Skin Integrity: Goal: Risk for impaired skin integrity will decrease Outcome: Progressing   Problem: Skin Integrity: Goal: Risk for impaired skin integrity will decrease Outcome: Progressing

## 2019-04-13 NOTE — Progress Notes (Signed)
Pt's RR 30, HR 119, MEWS score red. [place] updated. Q1 hr VS x4, will communicate this to on-coming nurse.

## 2019-04-13 NOTE — Progress Notes (Signed)
Patient ID: Lauren Reyes, female   DOB: 1990-07-15, 29 y.o.   MRN: [id number]         [facility] for Infectious Disease  Date of Admission:  04/01/2019   Total days of antibiotics 13         ASSESSMENT: I do not know what accounts for her recent clinical decline.  She has worsening pneumonia plus minus volume overload but no hypoxia to explain her symptoms.  PLAN: 1. Continue vancomycin and ceftriaxone for 6 weeks total through 05/17/2019 (this will need to be given in a supervised setting)  Active Problems:   Infective endocarditis   Acute ischemic left MCA stroke ([place])   Middle cerebral artery embolism, left   Acute respiratory failure ([place])   DVT (deep venous thrombosis) ([place])   Encounter for central line placement   Endotracheal tube present   Cerebrovascular accident (CVA) due to embolism of precerebral artery ([place])   Normocytic anemia   Polysubstance abuse ([place])   IVDU (intravenous drug user)   Scheduled Meds: . aspirin  81 mg Oral Daily   Or  . aspirin  81 mg Per Tube Daily  . atorvastatin  80 mg Per Tube q1800  . chlorhexidine gluconate (MEDLINE KIT)  15 mL Mouth Rinse BID  . Chlorhexidine Gluconate Cloth  6 each Topical Daily  . feeding supplement (ENSURE ENLIVE)  237 mL Oral TID BM  . metoprolol tartrate  12.5 mg Oral BID  . potassium chloride  40 mEq Oral BID  . QUEtiapine  25 mg Oral QHS  . sodium chloride flush  10-40 mL Intracatheter Q12H  . ticagrelor  90 mg Oral BID  . vitamin B-12  1,000 mcg Per Tube Daily   Or  . vitamin B-12  1,000 mcg Oral Daily   Continuous Infusions: . sodium chloride Stopped (04/06/19 0609)  . cefTRIAXone (ROCEPHIN)  IV 2 g (04/13/19 1038)  . vancomycin     PRN Meds:.sodium chloride, acetaminophen **OR** acetaminophen (TYLENOL) oral liquid 160 mg/5 mL **OR** acetaminophen, LORazepam, melatonin, ondansetron (ZOFRAN) IV, senna-docusate, sodium chloride flush   SUBJECTIVE: Apparently Lauren Reyes was very anxious and had  multiple "panic attacks" and difficulty breathing yesterday.  She received Ativan.  Head CT showed evolution of known areas of stroke.  Chest x-ray showed worsening bilateral infiltrates.  She had no documented hypoxia.  Review of Systems: Review of Systems  Unable to perform ROS: Mental acuity    No Known Allergies  OBJECTIVE: Vitals:   04/13/19 0843 04/13/19 0903 04/13/19 1012 04/13/19 1113  BP: 113/64  (!) 122/57   Pulse:  (!) 123    Resp: (!) 52 (!) 43 (!) 41 20  Temp:  98.3 F (36.8 C)    TempSrc:  Oral    SpO2: 97% 99%  100%  Weight:      Height:       Body mass index is 26.52 kg/m.  Physical Exam Constitutional:      Comments: She was sleeping when I entered the room.  She was very difficult to arouse and remained lethargic throughout the exam.  Her speech is much more slurred today.  Her nurse was present and stated that her mental status waxed and waned yesterday.  Cardiovascular:     Rate and Rhythm: Regular rhythm. Tachycardia present.     Heart sounds: Murmur present.  Pulmonary:     Effort: Pulmonary effort is normal.     Breath sounds: Normal breath sounds.     Comments: She  is tachypneic with shallow respirations. Abdominal:     Palpations: Abdomen is soft.     Tenderness: There is no abdominal tenderness.  Musculoskeletal:        General: No swelling or tenderness.  Skin:    Findings: No rash.  Neurological:     Comments: Her speech is slurred.  Psychiatric:        Mood and Affect: Mood normal.     Lab Results Lab Results  Component Value Date   WBC 13.5 (H) 04/13/2019   HGB 10.3 (L) 04/13/2019   HCT 32.9 (L) 04/13/2019   MCV 85.7 04/13/2019   PLT 665 (H) 04/13/2019    Lab Results  Component Value Date   CREATININE 0.68 04/13/2019   BUN 6 04/13/2019   NA 141 04/13/2019   K 4.0 04/13/2019   CL 111 04/13/2019   CO2 22 04/13/2019    Lab Results  Component Value Date   ALT 35 04/12/2019   AST 35 04/12/2019   ALKPHOS 71 04/12/2019    BILITOT 0.6 04/12/2019     Microbiology: No results found for this or any previous visit (from the past 240 hour(s)).  Michel Bickers, MD [facility] [place] [facility] [phone] pager   [phone] cell 04/13/2019, 12:06 PM

## 2019-04-13 NOTE — Plan of Care (Addendum)
MEWS in red upon change of shift. VS completed per protocol. Pt in and out of sleep. Plan of care reviewed with pt and mother at bedside. Pt stable at this time, will continue to monitor. Call bell in reach. Alarms activated.  Problem: Education: Goal: Knowledge of disease or condition will improve Outcome: Progressing Goal: Knowledge of secondary prevention will improve Outcome: Progressing Goal: Knowledge of patient specific risk factors addressed and post discharge goals established will improve Outcome: Progressing   Problem: Coping: Goal: Will verbalize positive feelings about self Outcome: Progressing Goal: Will identify appropriate support needs Outcome: Progressing   Problem: Health Behavior/Discharge Planning: Goal: Ability to manage health-related needs will improve Outcome: Progressing   Problem: Self-Care: Goal: Ability to participate in self-care as condition permits will improve Outcome: Progressing Goal: Verbalization of feelings and concerns over difficulty with self-care will improve Outcome: Progressing Goal: Ability to communicate needs accurately will improve Outcome: Progressing   Problem: Nutrition: Goal: Risk of aspiration will decrease Outcome: Progressing   Problem: Ischemic Stroke/TIA Tissue Perfusion: Goal: Complications of ischemic stroke/TIA will be minimized Outcome: Progressing   Problem: Education: Goal: Knowledge of General Education information will improve Description: Including pain rating scale, medication(s)/side effects and non-pharmacologic comfort measures Outcome: Progressing   Problem: Health Behavior/Discharge Planning: Goal: Ability to manage health-related needs will improve Outcome: Progressing   Problem: Clinical Measurements: Goal: Ability to maintain clinical measurements within normal limits will improve Outcome: Progressing Goal: Will remain free from infection Outcome: Progressing Goal: Diagnostic test results  will improve Outcome: Progressing Goal: Respiratory complications will improve Outcome: Progressing Goal: Cardiovascular complication will be avoided Outcome: Progressing   Problem: Activity: Goal: Risk for activity intolerance will decrease Outcome: Progressing   Problem: Nutrition: Goal: Adequate nutrition will be maintained Outcome: Progressing   Problem: Coping: Goal: Level of anxiety will decrease Outcome: Progressing   Problem: Elimination: Goal: Will not experience complications related to bowel motility Outcome: Progressing Goal: Will not experience complications related to urinary retention Outcome: Progressing   Problem: Pain Managment: Goal: General experience of comfort will improve Outcome: Progressing   Problem: Safety: Goal: Ability to remain free from injury will improve Outcome: Progressing   Problem: Skin Integrity: Goal: Risk for impaired skin integrity will decrease Outcome: Progressing

## 2019-04-13 NOTE — Progress Notes (Signed)
Patient ID: Lauren Reyes, female   DOB: 01-12-1990, 29 y.o.   MRN: [id number]      [address]       [place] [address]             [phone]                 6 Days Post-Op Procedure(s) (LRB): TRANSESOPHAGEAL ECHOCARDIOGRAM (TEE) (N/A) BUBBLE STUDY  LOS: 12 days   Subjective: Not as awake and alert as Friday. Was  given increased  Sedating meds yesterday. Mother in room with patient  Objective: Vital signs in last 24 hours: Patient Vitals for the past 24 hrs:  BP Temp Temp src Pulse Resp SpO2  04/13/19 1412 (!) 111/47 -- -- -- (!) 43 --  04/13/19 1312 115/80 -- -- -- (!) 51 --  04/13/19 1224 (!) 105/57 98 F (36.7 C) Oral 76 20 100 %  04/13/19 1113 -- -- -- -- 20 100 %  04/13/19 1012 (!) 122/57 -- -- -- (!) 41 --  04/13/19 0903 -- 98.3 F (36.8 C) Oral (!) 123 (!) 43 99 %  04/13/19 0843 113/64 -- -- -- (!) 52 97 %  04/13/19 0812 (!) 143/60 -- -- -- (!) 58 99 %  04/13/19 0752 (!) 111/48 98.2 F (36.8 C) Oral -- (!) 37 99 %  04/13/19 0744 (!) 97/52 -- -- -- (!) 35 --  04/13/19 0742 (!) 99/56 -- -- -- (!) 44 --  04/13/19 0725 -- -- -- -- (!) 39 100 %  04/13/19 0625 (!) 117/58 98 F (36.7 C) Oral (!) 119 (!) 30 100 %  04/13/19 0516 131/67 97.6 F (36.4 C) Oral (!) 125 -- --  04/13/19 0314 112/62 -- -- -- -- --  04/13/19 0311 (!) 81/54 98 F (36.7 C) Oral (!) 120 (!) 21 97 %  04/13/19 0111 (!) 121/57 98.2 F (36.8 C) Oral (!) 118 -- --  04/12/19 2241 -- -- -- (!) 120 (!) 23 --  04/12/19 2233 129/67 98 F (36.7 C) Oral (!) 120 (!) 30 95 %  04/12/19 2147 127/71 97.9 F (36.6 C) Oral (!) 129 -- 98 %  04/12/19 2021 132/78 98.2 F (36.8 C) Oral (!) 128 (!) 22 99 %  04/12/19 1933 -- -- -- 100 20 --  04/12/19 1551 (!) 156/84 99.4 F (37.4 C) Oral (!) 103 (!) 24 97 %  04/12/19 1536 -- -- -- (!) 120 -- --    Filed Weights   04/01/19 1900 04/01/19 2023  Weight: 72.3 kg 72.3 kg    Hemodynamic parameters for last 24 hours:    Intake/Output  from previous day: 04/04 0701 - 04/05 0700 In: 1039.4 [P.O.:600; IV Piggyback:439.4] Out: 300 [Urine:300] Intake/Output this shift: Total I/O In: 870.9 [P.O.:470; IV Piggyback:400.9] Out: -   Scheduled Meds: . aspirin  81 mg Oral Daily   Or  . aspirin  81 mg Per Tube Daily  . atorvastatin  80 mg Per Tube q1800  . chlorhexidine gluconate (MEDLINE KIT)  15 mL Mouth Rinse BID  . Chlorhexidine Gluconate Cloth  6 each Topical Daily  . feeding supplement  1 Container Oral TID BM  . metoprolol tartrate  12.5 mg Oral BID  . potassium chloride  40 mEq Oral BID  . QUEtiapine  25 mg Oral QHS  . sodium chloride flush  10-40 mL Intracatheter Q12H  . ticagrelor  90 mg Oral BID  . vitamin B-12  1,000 mcg Per Tube  Daily   Or  . vitamin B-12  1,000 mcg Oral Daily   Continuous Infusions: . sodium chloride Stopped (04/06/19 0609)  . cefTRIAXone (ROCEPHIN)  IV 2 g (04/13/19 1038)  . vancomycin 1,000 mg (04/13/19 1343)   PRN Meds:.sodium chloride, acetaminophen **OR** acetaminophen (TYLENOL) oral liquid 160 mg/5 mL **OR** acetaminophen, LORazepam, melatonin, ondansetron (ZOFRAN) IV, senna-docusate, sodium chloride flush  General appearance: not as alert as previous, speech worse, does follow commands, moves all extremities to command    Neurologic: as above  Heart: murmur of AI predominates unchanged  Lungs: not wheezing , decreased breath sounds at base  Extremities: no edema, no new splinter hemorrhages      Lab Results: CBC: Recent Labs    04/12/19 0423 04/13/19 0838  WBC 15.0* 13.5*  HGB 9.9* 10.3*  HCT 30.9* 32.9*  PLT 631* 665*   BMET:  Recent Labs    04/12/19 0423 04/13/19 0838  NA 141 141  K 3.3* 4.0  CL 111 111  CO2 21* 22  GLUCOSE 105* 100*  BUN <5* 6  CREATININE 0.55 0.68  CALCIUM 8.2* 8.5*    PT/INR: No results for input(s): LABPROT, INR in the last 72 hours.   Radiology CT HEAD WO CONTRAST  Result Date: 04/12/2019 CLINICAL DATA:  Stroke, follow-up EXAM: CT  HEAD WITHOUT CONTRAST TECHNIQUE: Contiguous axial images were obtained from the base of the skull through the vertex without intravenous contrast. COMPARISON:  04/01/2018, correlation made with MRI brain 04/02/2018 FINDINGS: Brain: Artifact is present on several slices. There is hypoattenuation with loss of gray-white differentiation along the left insula corresponding to recent infarction. Few additional ill-defined foci are present bilaterally likely corresponding to many small infarcts seen on MRI. Hypoattenuation within lateral left cerebellum is likely artifactual. Petechial hemorrhage on MRI is not visible. There is no discrete hematoma. Ventricles are stable in size. Vascular: No hyperdense vessel or unexpected calcification. A stent is seen within the distal cervical left ICA extending to the proximal petrous portion. Skull: Calvarium is unremarkable. Sinuses/Orbits: No acute finding. Other: None. IMPRESSION: Evolving areas of recent infarction better seen on prior MRI. Petechial hemorrhage is not visible. There is no discrete hematoma. Electronically Signed   By: Macy Mis M.D.   On: 04/12/2019 18:29   DG CHEST PORT 1 VIEW  Result Date: 04/12/2019 CLINICAL DATA:  Tachypnea. EXAM: PORTABLE CHEST 1 VIEW COMPARISON:  Radiograph 04/04/2019. CT 04/02/2019 FINDINGS: Right upper extremity PICC tip in the upper SVC. Interval extubation and removal of enteric tube. Progressive heterogeneous bilateral lung opacities with central air bronchograms on the left. Probable small pleural effusions with hazy basilar opacities. Unchanged heart size and mediastinal contours. No pneumothorax. IMPRESSION: 1. Progressive heterogeneous bilateral lung opacities with central air bronchograms on the left, may be pulmonary edema, multifocal pneumonia or ARDS. 2. Suspected pleural effusions. Electronically Signed   By: Keith Rake M.D.   On: 04/12/2019 22:29     Assessment/Plan: S/P Procedure(s)  (LRB): TRANSESOPHAGEAL ECHOCARDIOGRAM (TEE) (N/A) BUBBLE STUDY Neuro / mental status has worsened  Increased pulmonary opacities on chest xray yesterday  With worsening mental status , not currently considered for surgery Recommend cardiology be involved in multiple disciplinary approach to care and evaluation of endocarditis    Consider diuresis     Grace Isaac MD 04/13/2019 3:28 PM

## 2019-04-13 NOTE — Progress Notes (Signed)
PROGRESS NOTE  Lauren Reyes ZWC:[id number] DOB: 07/09/1990 DOA: 04/01/2019 PCP: Patient, No Pcp Per  Brief History   Lauren Reyes a 29 y.o.femalewith unknown past medical history, possible heroin abuse presents to the emergency department as a code stroke with aphasia and right-sided weakness.  [place] [employer] called to the hotel room where she was staying as she had overstated her visit,patient was  Found by PD talking on the phone and suddenly started to slur her speech, the right facial droop and had difficulty talking. When EMS arrived patient still communicating but slowly progressively got worse.   Patient was admitted to neurology ICU intubated after  Thrombectomy She underwent thrombectomy for Left MCA m3 occlusion with recanalization complicated by dissection/pseudoanerusym of left ICA s/p stent. She was extubated on March 27.   This morning nursing communicated that the patient had increased heart rate and respiratory rate. This has been an issue for many days. EKG demonstrated only sinus tachycardia. This is assumed to be due to deconditioning or withdrawal, probably from benzodiazepines for which the patient was positive on admission. Ativan has been made available on as needed basis.   Consultants  . PCCM . Gastroenterology . Cardiothoracic Surgery . Infectious disease.  Procedures  . TEE .  Antibiotics   Anti-infectives (From admission, onward)   Start     Dose/Rate Route Frequency Ordered Stop   04/13/19 1400  vancomycin (VANCOCIN) IVPB 1000 mg/200 mL premix     1,000 mg 200 mL/hr over 60 Minutes Intravenous Every 8 hours 04/13/19 0839     04/12/19 2200  vancomycin (VANCOREADY) IVPB 750 mg/150 mL  Status:  Discontinued     750 mg 150 mL/hr over 60 Minutes Intravenous Every 8 hours 04/12/19 1500 04/13/19 0839   04/10/19 1336  vancomycin (VANCOCIN) IVPB 1000 mg/200 mL premix  Status:  Discontinued     1,000 mg 200 mL/hr over 60 Minutes  Intravenous Every 8 hours 04/10/19 1320 04/12/19 1500   04/08/19 1200  cefTRIAXone (ROCEPHIN) 2 g in sodium chloride 0.9 % 100 mL IVPB     2 g 200 mL/hr over 30 Minutes Intravenous Every 12 hours 04/08/19 1013 05/17/19 2359   04/08/19 0530  vancomycin (VANCOCIN) IVPB 1000 mg/200 mL premix  Status:  Discontinued     1,000 mg 200 mL/hr over 60 Minutes Intravenous Every 12 hours 04/07/19 1541 04/10/19 1320   04/07/19 1900  Ampicillin-Sulbactam (UNASYN) 3 g in sodium chloride 0.9 % 100 mL IVPB  Status:  Discontinued     3 g 200 mL/hr over 30 Minutes Intravenous Every 6 hours 04/07/19 1457 04/08/19 1013   04/07/19 1730  vancomycin (VANCOREADY) IVPB 1500 mg/300 mL     1,500 mg 150 mL/hr over 120 Minutes Intravenous  Once 04/07/19 1541 04/07/19 1937   04/04/19 1400  ceFAZolin (ANCEF) IVPB 2g/100 mL premix  Status:  Discontinued     2 g 200 mL/hr over 30 Minutes Intravenous Every 8 hours 04/04/19 1119 04/07/19 1457   04/04/19 0400  Vancomycin (VANCOCIN) 1,250 mg in sodium chloride 0.9 % 250 mL IVPB  Status:  Discontinued     1,250 mg 166.7 mL/hr over 90 Minutes Intravenous Every 12 hours 04/03/19 1635 04/04/19 1159   04/02/19 1800  ceFEPIme (MAXIPIME) 2 g in sodium chloride 0.9 % 100 mL IVPB  Status:  Discontinued     2 g 200 mL/hr over 30 Minutes Intravenous Every 8 hours 04/02/19 1218 04/04/19 1127   04/02/19 1600  vancomycin (VANCOCIN) IVPB 1000  mg/200 mL premix  Status:  Discontinued     1,000 mg 200 mL/hr over 60 Minutes Intravenous Every 12 hours 04/02/19 0100 04/03/19 1635   04/02/19 0200  piperacillin-tazobactam (ZOSYN) IVPB 3.375 g  Status:  Discontinued     3.375 g 12.5 mL/hr over 240 Minutes Intravenous Every 8 hours 04/02/19 0059 04/02/19 1219   04/02/19 0200  vancomycin (VANCOCIN) 1,500 mg in sodium chloride 0.9 % 500 mL IVPB     1,500 mg 250 mL/hr over 120 Minutes Intravenous  Once 04/02/19 0100 04/02/19 0923   04/01/19 2040  ceFAZolin (ANCEF) 2-4 GM/100ML-% IVPB    Note to  Pharmacy: [name]   : cabinet override      04/01/19 2040 04/02/19 0844    .   Subjective  The patient is now resting after having received IV ativan for anxiety. No new complaints.  Objective   Vitals:  Vitals:   04/13/19 1512 04/13/19 1603  BP: (!) 119/50 113/82  Pulse:  (!) 116  Resp: (!) 36 (!) 38  Temp:  99.3 F (37.4 C)  SpO2:  100%   Exam:  Constitutional:  . The patient is sleeping. No acute distress. Respiratory:  . No increased work of breathing. . No wheezes, rales, or rhonchi . No tactile fremitus Cardiovascular:  . Regular rate and rhythm . No murmurs, ectopy, or gallups. . No lateral PMI. No thrills. Abdomen:  . Abdomen is soft, non-tender, non-distended . No hernias, masses, or organomegaly . Normoactive bowel sounds.  Musculoskeletal:  . No cyanosis, clubbing, or edema Skin:  . No rashes, lesions, ulcers . palpation of skin: no induration or nodules Neurologic:  . Pt is unable to participate in exam.  Psychiatric:  Pt is unable to participate in exam.   I have personally reviewed the following:   Today's Data  . Vitals, CBC, BMP  Micro Data  .  Culture Tracheal aspirate - MSSA  Imaging  . CT head . CT abdomen and pelvis  Cardiology Data  . EKG  Scheduled Meds: . aspirin  81 mg Oral Daily   Or  . aspirin  81 mg Per Tube Daily  . atorvastatin  80 mg Per Tube q1800  . chlorhexidine gluconate (MEDLINE KIT)  15 mL Mouth Rinse BID  . Chlorhexidine Gluconate Cloth  6 each Topical Daily  . feeding supplement  1 Container Oral TID BM  . metoprolol tartrate  12.5 mg Oral BID  . potassium chloride  40 mEq Oral BID  . QUEtiapine  25 mg Oral QHS  . sodium chloride flush  10-40 mL Intracatheter Q12H  . ticagrelor  90 mg Oral BID  . vitamin B-12  1,000 mcg Per Tube Daily   Or  . vitamin B-12  1,000 mcg Oral Daily   Continuous Infusions: . sodium chloride Stopped (04/06/19 0609)  . cefTRIAXone (ROCEPHIN)  IV 2 g (04/13/19 1038)  .  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  Septic emboli have been seen in left hemisphere of brain, in  lung, spleen with possible splenic and kidney infarcts. Initially the patient was treated with Vancomycin and Unasyn. She is currently receiving Vancomycin and Rocephin. Her fever has resolved. Per infectious disease the patient will require 6 weeks total of this regimen with her last dose being 05/17/2019. WBC has declined from 29+ to 13.5 today. The patient has been evaluated by thoracic surgery, Dr. Servando Snare: patient eventually will require aortic valve replacement, however this is complicated by recent stroke and recent stent on aspirin and Brilinta, as well as a recent decline in mental status.  Per Dr. Pia Mau, surgery should be reconsidered should the patient developed worsening of heart failure syndrome and she has to be off Brilinta for 7 days. He has also requested psychiatric treatment program for IV drug use to be established before considering cardiac surgery intervention.  CVA: The patient has had a small infarct of left MCA territory. TPA was deferred as concern for septic emboli. The patient underwent a leftcommon carotid arteriogram followed by complete revascularization of occluded M3 region of inferior division ofleft MCA . She is also s/p placement of a 4.5 mm x 58m pipeline flow diverter at site of focal dissection associated with a small  pseudoaneurysm of distal cervical LT ICA. She has been continued on ASA and Brillinta. A1c is 5.9, VLDL was 52. She is on lipitor 80 mg daily.  Dyslipidemia: With low HDL less than 10, high VLDL at 52, high triglyceride at 258. She is on Lipitor.  Sinus tachycardia/ Tachypnea: Drug withdrawal vs deconditioning. The patient has been started on as needed ativan.TSH 0.8. She is also receiving scheduled low-dose Lopressor with holding parameters  Right common femoral vein DVT: Not a candidate for anticoagulation due to large right groin hematoma post procedure and endocarditis. She has undergone placement of IVC filter on March 26 by IR.  Large right groin hematoma post procedure: Pseudoaneurysm addressed with S/P 20F angioseal for hemostasisat therightCFA access site. Monitor CBC.  Blood loss anemia, likely from right groin hematoma. Status post PRBC transfusion x2 on March 27. GI Dr GPenelope Coopconsulted on March 27 who do not think patient has any signs of GI bleed. Monitor CBC.  B12 deficiency: Supplement.  Hypokalemia/hypomagnesemia: Monitor and supplement.  Hyponatremia: Present on admission. Now resolved. Monitor.  Hepatitis C:  Antibody positive, viral load negative. HIV screening negative RPR screening negative  Polysubstance abuse: UDS on presentation positive for THC amphetamine. Suspect IV drug use with heroin, multiple needle marks bilateral upper extremities. Patient state IV drug use ongoing for many years now she is waiting to quit. Positive for benzodiazepines on 04/12/2019.  Anxiety versus drug withdrawal:  Positive for benzodiazepines on admission. She has an episode of crying, acting erratic on Sunday 4 a.m. Appears to be responding to as needed Ativan. UDS positive for benzodiazepines on 04/12/2019.  4:47 PM RN report patient become more confused with increased slurred speech Stat CT head demonstrated evolving areas of recent infarction better seen on prior MRI.  Petechial hemorrhage is not visible. There is no discrete hematoma. No acute interval changes.  Deconditioning vs CMO due to polysubstance abuse: Per PT: She need assistant for bed mobility and transfers, she needs supervision by one person hand-held for ambulation and gait, she was able to ambulate 80 feet on [address] 2nd,  Gait Pattern/deviations: Step-through pattern;Decreased stride length;Wide base of support;Trunk flexed. General Gait Details: Mild unsteadiness,  pt reaching for furniture required HHA to improve balance. HR 131 bpm at it's highest.  The patient has been seen and examined by me. I have spent 35 minutes in her evaluation and care.  DVT Prophylaxis:scd's Code Status: full Family Communication: none present Disposition Plan: patient is from home. Discharge SNF vs home. Barriers to discharge. The patient will need to complete her 6 weeks of IV Vancomycin and Rocephin. Her last dose will be on 05/17/2019. She has infective endocarditis and septic emboli due to IV drug abuse. She also has no insurance although she may be able to get Medicaid.  Breindy Meadow, DO [facility] Direct contact: see [email or web address]  7PM-7AM contact night coverage as above 04/13/2019, 5:17 PM  LOS: 12 days

## 2019-04-13 NOTE — Progress Notes (Signed)
Pt's RR 40-50's resting, [place] informed

## 2019-04-13 NOTE — Progress Notes (Signed)
Pharmacy Antibiotic Note  Lauren Reyes is a 29 y.o. female admitted on 04/01/2019 as a code stroke with aphasia and right-sided weakness. Pharmacy has been consulted for vancomycin dosing.  TEE on 3/30 revealed severe endocarditis involving aortic, mitral, and tricuspid valves with severe aortic regurgitation. Patient's condition is further complicated by septic emboli to her brain, lungs, and spleen. Respiratory culture grew MSSA, however, recent blood cultures are negative. Appears to have received at least one dose of cefazolin prior to culture draw, but will plan to treat with vancomycin and ceftriaxone to cover MSSA and other causes of culture-negative endocarditis.  Afebrile, WBC decreasing 15, Scr stable.  4/4 VP = 46 4/4 VT = 14 Vancomycin trough only slightly below goal. Trough was drawn appropriately.  Since patient has concern for cerebral emboli, will target trough of 15-20.  Plan: - Increase vancomcyin to 1000 mg q8h -Continue ceftriaxone IV 2g q12 her ID -Recheck peak and trough levels at steady state. -Monitor renal function, clinical improvement, WBC, and temperature  Height: 5\' 5"  (165.1 cm) Weight: 72.3 kg (159 lb 6.3 oz) IBW/kg (Calculated) : 57  Temp (24hrs), Avg:98.2 F (36.8 C), Min:97.6 F (36.4 C), Max:99.4 F (37.4 C)  Recent Labs  Lab 04/08/19 0530 04/08/19 0530 04/09/19 0535 04/09/19 2013 04/10/19 0512 04/11/19 0431 04/12/19 0423 04/12/19 0740 04/12/19 1335 04/13/19 0838  WBC 18.7*   < > 16.3*  --  15.8* 15.4* 15.0*  --   --  13.5*  CREATININE 0.49  --   --   --   --  0.41* 0.55  --   --  0.68  VANCOTROUGH  --   --   --   --  8*  --   --   --  14*  --   VANCOPEAK  --   --   --  27*  --   --   --  46*  --   --    < > = values in this interval not displayed.    Estimated Creatinine Clearance: 102.4 mL/min (by C-G formula based on SCr of 0.68 mg/dL).    No Known Allergies  Antimicrobials this admission: Zosyn 3/25 >> 3/25 Cefepime  3/25 >> 3/27 Ancef 3/27 >> 3/30 Vanc 3/25 >> 3/27; restarted 3/30 >>  Unasyn 3/30>>3/31 Ceftriaxone 3/31>>(5/9)  Microbiology results: 3/24 COVID/Flu >> neg 3/25 RCx >> moderate MSSA 3/25 MRSA PCR >> negative 3/25 BCx >> negative   Thank you for involving pharmacy in this patient's care.  4/25, PharmD PGY1 Acute Care Pharmacy Resident 04/13/2019 1:24 PM  **Pharmacist phone directory can be found on [email or web address] listed under [facility]**

## 2019-04-13 NOTE — Progress Notes (Addendum)
Upon initial shift assessment, it was noted that respiratory rate is currently in the 40's. Resting in bed in no acute respiratory distress. ST on bedside monitor with rate of 115. MEWS score 5. Vitals set for Q 2 hours. Charge nurse notified. MD note from today reviewed and it is noted that MD is aware of increased respiratory rate and heart rate. Patient in low bed with floor mats in place. Seizure precautions initiated.

## 2019-04-14 LAB — CBC WITH DIFFERENTIAL/PLATELET
Abs Immature Granulocytes: 0.04 10*3/uL (ref 0.00–0.07)
Basophils Absolute: 0.1 10*3/uL (ref 0.0–0.1)
Basophils Relative: 1 %
Eosinophils Absolute: 0.2 10*3/uL (ref 0.0–0.5)
Eosinophils Relative: 2 %
HCT: 29.4 % — ABNORMAL LOW (ref 36.0–46.0)
Hemoglobin: 9.3 g/dL — ABNORMAL LOW (ref 12.0–15.0)
Immature Granulocytes: 0 %
Lymphocytes Relative: 20 %
Lymphs Abs: 2.4 10*3/uL (ref 0.7–4.0)
MCH: 27.6 pg (ref 26.0–34.0)
MCHC: 31.6 g/dL (ref 30.0–36.0)
MCV: 87.2 fL (ref 80.0–100.0)
Monocytes Absolute: 0.7 10*3/uL (ref 0.1–1.0)
Monocytes Relative: 6 %
Neutro Abs: 8.7 10*3/uL — ABNORMAL HIGH (ref 1.7–7.7)
Neutrophils Relative %: 71 %
Platelets: 483 10*3/uL — ABNORMAL HIGH (ref 150–400)
RBC: 3.37 MIL/uL — ABNORMAL LOW (ref 3.87–5.11)
RDW: 20.8 % — ABNORMAL HIGH (ref 11.5–15.5)
WBC: 12.1 10*3/uL — ABNORMAL HIGH (ref 4.0–10.5)
nRBC: 0 % (ref 0.0–0.2)

## 2019-04-14 MED ORDER — LORAZEPAM 1 MG PO TABS
1.0000 mg | ORAL_TABLET | ORAL | Status: DC | PRN
  Administered 2019-04-14 – 2019-04-27 (×14): 1 mg via ORAL
  Filled 2019-04-14 (×14): qty 1

## 2019-04-14 MED ORDER — BUSPIRONE HCL 15 MG PO TABS
7.5000 mg | ORAL_TABLET | Freq: Two times a day (BID) | ORAL | Status: DC
  Administered 2019-04-14 – 2019-04-27 (×27): 7.5 mg via ORAL
  Filled 2019-04-14 (×30): qty 1

## 2019-04-14 NOTE — Progress Notes (Signed)
PROGRESS NOTE  Lauren Reyes LDJ:[id number] DOB: 1990/03/31 DOA: 04/01/2019 PCP: Patient, No Pcp Per  Brief History   Lauren Reyes a 29 y.o.femalewith unknown past medical history, possible heroin abuse presents to the emergency department as a code stroke with aphasia and right-sided weakness.  [place] PD called to the hotel room where she was staying as she had overstated her visit,patient was  Found by PD talking on the phone and suddenly started to slur her speech, the right facial droop and had difficulty talking. When EMS arrived patient still communicating but slowly progressively got worse.   Patient was admitted to neurology ICU intubated after  Thrombectomy She underwent thrombectomy for Left MCA m3 occlusion with recanalization complicated by dissection/pseudoanerusym of left ICA s/p stent. She was extubated on March 27.   This morning nursing communicated that the patient had increased heart rate and respiratory rate. This has been an issue for many days. EKG demonstrated only sinus tachycardia. This is assumed to be due to deconditioning or withdrawal, probably from benzodiazepines for which the patient was positive on admission. Ativan has been made available on as needed basis and she has been prescribed scheduled Buspar.   CTS has been consulted and has stated that the patient is not currently a candidate for surgery. However, they do state that surgery must be considered if the patient deteriorates acutely. Infectious disease is consulted. They have recommended continuing Vancomycin and ceftriaxone for a total of 6 weeks. The last dose is to be given on 05/17/2019. IV antibiotics must be given in a supervised setting.  Consultants  . PCCM . Gastroenterology . Cardiothoracic Surgery . Infectious disease.  Procedures  . TEE .  Antibiotics   Anti-infectives (From admission, onward)   Start     Dose/Rate Route Frequency Ordered Stop   04/13/19  1400  vancomycin (VANCOCIN) IVPB 1000 mg/200 mL premix     1,000 mg 200 mL/hr over 60 Minutes Intravenous Every 8 hours 04/13/19 0839     04/12/19 2200  vancomycin (VANCOREADY) IVPB 750 mg/150 mL  Status:  Discontinued     750 mg 150 mL/hr over 60 Minutes Intravenous Every 8 hours 04/12/19 1500 04/13/19 0839   04/10/19 1336  vancomycin (VANCOCIN) IVPB 1000 mg/200 mL premix  Status:  Discontinued     1,000 mg 200 mL/hr over 60 Minutes Intravenous Every 8 hours 04/10/19 1320 04/12/19 1500   04/08/19 1200  cefTRIAXone (ROCEPHIN) 2 g in sodium chloride 0.9 % 100 mL IVPB     2 g 200 mL/hr over 30 Minutes Intravenous Every 12 hours 04/08/19 1013 05/17/19 2359   04/08/19 0530  vancomycin (VANCOCIN) IVPB 1000 mg/200 mL premix  Status:  Discontinued     1,000 mg 200 mL/hr over 60 Minutes Intravenous Every 12 hours 04/07/19 1541 04/10/19 1320   04/07/19 1900  Ampicillin-Sulbactam (UNASYN) 3 g in sodium chloride 0.9 % 100 mL IVPB  Status:  Discontinued     3 g 200 mL/hr over 30 Minutes Intravenous Every 6 hours 04/07/19 1457 04/08/19 1013   04/07/19 1730  vancomycin (VANCOREADY) IVPB 1500 mg/300 mL     1,500 mg 150 mL/hr over 120 Minutes Intravenous  Once 04/07/19 1541 04/07/19 1937   04/04/19 1400  ceFAZolin (ANCEF) IVPB 2g/100 mL premix  Status:  Discontinued     2 g 200 mL/hr over 30 Minutes Intravenous Every 8 hours 04/04/19 1119 04/07/19 1457   04/04/19 0400  Vancomycin (VANCOCIN) 1,250 mg in sodium chloride 0.9 % 250  mL IVPB  Status:  Discontinued     1,250 mg 166.7 mL/hr over 90 Minutes Intravenous Every 12 hours 04/03/19 1635 04/04/19 1159   04/02/19 1800  ceFEPIme (MAXIPIME) 2 g in sodium chloride 0.9 % 100 mL IVPB  Status:  Discontinued     2 g 200 mL/hr over 30 Minutes Intravenous Every 8 hours 04/02/19 1218 04/04/19 1127   04/02/19 1600  vancomycin (VANCOCIN) IVPB 1000 mg/200 mL premix  Status:  Discontinued     1,000 mg 200 mL/hr over 60 Minutes Intravenous Every 12 hours 04/02/19  0100 04/03/19 1635   04/02/19 0200  piperacillin-tazobactam (ZOSYN) IVPB 3.375 g  Status:  Discontinued     3.375 g 12.5 mL/hr over 240 Minutes Intravenous Every 8 hours 04/02/19 0059 04/02/19 1219   04/02/19 0200  vancomycin (VANCOCIN) 1,500 mg in sodium chloride 0.9 % 500 mL IVPB     1,500 mg 250 mL/hr over 120 Minutes Intravenous  Once 04/02/19 0100 04/02/19 0923   04/01/19 2040  ceFAZolin (ANCEF) 2-4 GM/100ML-% IVPB    Note to Pharmacy: [name]   : cabinet override      04/01/19 2040 04/02/19 0844      Subjective  The patient is resting comfortably. No new complaints.  Objective   Vitals:  Vitals:   04/14/19 0820 04/14/19 0932  BP: (!) 99/56   Pulse:    Resp: 20   Temp:  98.3 F (36.8 C)  SpO2:  100%   Exam:  Constitutional:  . The patient is awake, alert, and oriented x 3. No acute distress. Respiratory:  . No increased work of breathing. . No wheezes, rales, or rhonchi . No tactile fremitus Cardiovascular:  . Regular rate and rhythm . No murmurs, ectopy, or gallups. . No lateral PMI. No thrills. Abdomen:  . Abdomen is soft, non-tender, non-distended . No hernias, masses, or organomegaly . Normoactive bowel sounds.  Musculoskeletal:  . No cyanosis, clubbing, or edema Skin:  . No rashes, lesions, ulcers . palpation of skin: no induration or nodules Neurologic:  . Pt is unable to participate in exam.  Psychiatric:  Pt is unable to participate in exam.   I have personally reviewed the following:   Today's Data  . Vitals, CBC  Micro Data  .  Culture Tracheal aspirate - MSSA  Imaging  . CT head . CT abdomen and pelvis  Cardiology Data  . EKG  Scheduled Meds: . aspirin  81 mg Oral Daily   Or  . aspirin  81 mg Per Tube Daily  . atorvastatin  80 mg Per Tube q1800  . busPIRone  7.5 mg Oral BID  . chlorhexidine gluconate (MEDLINE KIT)  15 mL Mouth Rinse BID  . Chlorhexidine Gluconate Cloth  6 each Topical Daily  . feeding supplement  1  Container Oral TID BM  . metoprolol tartrate  12.5 mg Oral BID  . QUEtiapine  25 mg Oral QHS  . sodium chloride flush  10-40 mL Intracatheter Q12H  . ticagrelor  90 mg Oral BID  . vitamin B-12  1,000 mcg Per Tube Daily   Or  . vitamin B-12  1,000 mcg Oral Daily   Continuous Infusions: . sodium chloride Stopped (04/06/19 0609)  . cefTRIAXone (ROCEPHIN)  IV 2 g (04/14/19 1038)  . 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  Septic emboli have been seen in left hemisphere of brain, in  lung, spleen with possible splenic and kidney infarcts. Initially the patient was treated with Vancomycin and Unasyn. She is currently receiving Vancomycin and Rocephin. Her fever has resolved. Per infectious disease the patient will require 6 weeks total of this regimen with her last dose being 05/17/2019. WBC has declined from 29+ to 13.5 today. The patient has been evaluated by thoracic surgery, Dr. Servando Snare: patient eventually will require aortic valve replacement, however this is complicated by recent stroke and recent stent on aspirin and Brilinta, as well as a recent decline in mental status.  Per Dr. Pia Mau, surgery should be reconsidered should the patient developed worsening of heart failure syndrome and she has to be off Brilinta for 7 days. He has also requested psychiatric treatment program for IV drug use to be established before considering cardiac surgery  intervention.  CVA: The patient has had a small infarct of left MCA territory. TPA was deferred as concern for septic emboli. The patient underwent a leftcommon carotid arteriogram followed by complete revascularization of occluded M3 region of inferior division ofleft MCA . She is also s/p placement of a 4.5 mm x 63m pipeline flow diverter at site of focal dissection associated with a small pseudoaneurysm of distal cervical LT ICA. She has been continued on ASA and Brillinta. A1c is 5.9, VLDL was 52. She is on lipitor 80 mg daily.  Dyslipidemia: With low HDL less than 10, high VLDL at 52, high triglyceride at 258. She is on Lipitor.  Sinus tachycardia/ Tachypnea: Drug withdrawal vs deconditioning. The patient has been started on as needed ativan.TSH 0.8. She is also receiving scheduled low-dose Lopressor with holding parameters  Right common femoral vein DVT: Not a candidate for anticoagulation due to large right groin hematoma post procedure and endocarditis. She has undergone placement of IVC filter on March 26 by IR.  Large right groin hematoma post procedure: Pseudoaneurysm addressed with S/P 19F angioseal for hemostasisat therightCFA access site. Monitor CBC.  Blood loss anemia, likely from right groin hematoma. Status post PRBC transfusion x2 on March 27. GI Dr GPenelope Coopconsulted on March 27 who do not think patient has any signs of GI bleed. Monitor CBC.  B12 deficiency: Supplement.  Hypokalemia/hypomagnesemia: Monitor and supplement.  Hyponatremia: Present on admission. Now resolved. Monitor.  Hepatitis C:  Antibody positive, viral load negative. HIV screening negative RPR screening negative  Polysubstance abuse: UDS on presentation positive for THC amphetamine. Suspect IV drug use with heroin, multiple needle marks bilateral upper extremities. Patient state IV drug use ongoing for many years now she is waiting to quit. Positive for benzodiazepines on  04/12/2019.  Anxiety versus drug withdrawal:  Positive for benzodiazepines on admission. She has an episode of crying, acting erratic on Lateisha 4 a.m. Appears to be responding to as needed Ativan. UDS positive for benzodiazepines on 04/12/2019. I have also started scheduled Buspar.   4:47 PM on 04/12/2019 RN report patient become more confused with increased slurred speech Stat CT head demonstrated evolving areas of recent  infarction better seen on prior MRI. Petechial hemorrhage is not visible. There is no discrete hematoma. No acute interval changes.  Tachycardia: Due to deconditioning vs CMO due to polysubstance abuse  Debility: Per PT she needs assistant for bed mobility and transfers. She needs supervision by one person hand-held for ambulation and gait. She was able to ambulate 80 feet on Doreena 2nd. Gait Pattern/deviations: Step-through pattern;Decreased stride length;Wide base of support;Trunk flexed. General Gait Details: Mild unsteadiness, pt reaching for furniture required HHA to improve balance. HR 131 bpm at it's highest.   The patient has been seen and examined by me. I have spent 35 minutes in her evaluation and care.  DVT Prophylaxis:scd's Code Status: full Family Communication: none present Disposition Plan: patient is from home. Discharge SNF vs home. Barriers to discharge. The patient will need to complete her 6 weeks of IV Vancomycin and Rocephin. Her last dose will be on 05/17/2019. She has infective endocarditis and septic emboli due to IV drug abuse. She also has no insurance although she may be able to get Medicaid.   , DO [facility] Direct contact: see [email or web address]  7PM-7AM contact night coverage as above 04/14/2019, 3:36 PM  LOS: 12 days

## 2019-04-14 NOTE — Progress Notes (Signed)
      [address]       [place] [address]             [phone]      7 Days Post-Op Procedure(s) (LRB): TRANSESOPHAGEAL ECHOCARDIOGRAM (TEE) (N/A) BUBBLE STUDY Subjective: Extreme anxiety this afternoon about bills, "outside world" ect. RR still elevated in the 30s.   Objective: Vital signs in last 24 hours: Temp:  [98 F (36.7 C)-99.3 F (37.4 C)] 99.3 F (37.4 C) (04/05 1603) Pulse Rate:  [76-123] 121 (04/06 0400) Cardiac Rhythm: Sinus tachycardia (04/05 1901) Resp:  [20-52] 22 (04/05 2120) BP: (99-123)/(46-82) 114/47 (04/06 0400) SpO2:  [97 %-100 %] 97 % (04/05 2120)     Intake/Output from previous day: 04/05 0701 - 04/06 0700 In: 870.9 [P.O.:470; IV Piggyback:400.9] Out: -  Intake/Output this shift: No intake/output data recorded.  General appearance: alert, cooperative and no distress Heart: sinus tachycardia, + murmur Lungs: clear to auscultation bilaterally, diminished in the lower lobes Abdomen: soft, non-tender Extremities: no edema Neuro: speaks coherently but slow to process at times. A and O x3   Lab Results: Recent Labs    04/13/19 0838 04/14/19 0500  WBC 13.5* 12.1*  HGB 10.3* 9.3*  HCT 32.9* 29.4*  PLT 665* 483*   BMET:  Recent Labs    04/12/19 0423 04/13/19 0838  NA 141 141  K 3.3* 4.0  CL 111 111  CO2 21* 22  GLUCOSE 105* 100*  BUN <5* 6  CREATININE 0.55 0.68  CALCIUM 8.2* 8.5*    PT/INR: No results for input(s): LABPROT, INR in the last 72 hours. ABG    Component Value Date/Time   PHART 7.347 (L) 04/02/2019 0032   HCO3 30.5 (H) 04/02/2019 0032   TCO2 32 04/02/2019 0032   O2SAT 100.0 04/02/2019 0032   CBG (last 3)  No results for input(s): GLUCAP in the last 72 hours.  Assessment/Plan: S/P Procedure(s) (LRB): TRANSESOPHAGEAL ECHOCARDIOGRAM (TEE) (N/A) BUBBLE STUDY  1. CV-sinus tachycardia, BP stable 2. Pulm-remains on room air with good oxygen saturation. Noted RR in the 40s last night.  3.  Renal-creatinine 0.68, electrolytes okay 4. H and H 9.3/29.4, continue to trend 5. Neuro-Hx of ischemia left MCA stroke 6. Polysubstance abuse 7. Continue Brillinta 8. On 6 weeks of IV Vancomycin and Rocephin. Last dose 05/17/2019.  9. Hx of panic attacks before admission. They have significantly increased per the patient. She is having 2-3 attacks per day where she feels like she can't breathe. She states that the ativan knocks her out but does not help the anxiety. Primary team to address-may need an SSRI? On Seroquel at bedtime.    Plan: Infective endocarditis with septic emboli due to IV drug abuse. Continue IV antibiotics. Consider cardiology involvement to assist. Not currently considered for surgery. Consult placed to the [facility] team to speak with the patient.     LOS: 13 days    [name] 04/14/2019

## 2019-04-14 NOTE — Progress Notes (Signed)
Occupational Therapy Treatment Patient Details Name: Lauren Reyes MRN: [id number] DOB: [birth year] Today's Date: 04/14/2019   OT comments  Pt progressing towards goals, today Pt excited to work with OT. Pt was found incontinent in the bed, min A for ambulation to bathroom with +1 min A and RW. Pt Able to urinate and perform peri care with min A (task by task verbal cues for bathing LB while sitting on BSC), PT then able to ambulate to sink and wash hands with min cues in standing while leaning on sink for balance. Pt returned supine mod I for bed mobility - but still requires cues for line management and safety. Pt displaying less impulsivity today, but continues to demonstrate cognitive deficits making 24 hour supervision essential and SNF level rehab post-acute essential. (CIR denied)    04/14/19 0900  OT Visit Information  Last OT Received On 04/14/19  Assistance Needed +1  History of Present Illness 29 year old female with suspected IV drug abuse who was being interviewed by police tonight and developed right-sided facial weakness, aphasia, and R weakness.  She was brought by EMS to the emergency room work-up revealed occluded left MCA. Revascularization performed by IR. CT head and neck demonstrate multiple small cavitary abscesses in the upper lobes bilaterally. IVC filter placement for RLE DVT on 3/26.  Precautions  Precautions Fall  Precaution Comments impulsive  Pain Assessment  Pain Assessment No/denies pain  Pain Intervention(s) Monitored during session  Cognition  Arousal/Alertness Awake/alert  Behavior During Therapy WFL for tasks assessed/performed  Overall Cognitive Status Impaired/Different from baseline  Area of Impairment Following commands;Safety/judgement;Problem solving  Following Commands Follows one step commands consistently;Follows multi-step commands inconsistently  Safety/Judgement Decreased awareness of safety;Decreased awareness of deficits  Problem  Solving Requires verbal cues;Difficulty sequencing  General Comments Pt very pleasant, slightly impulsive but following commands (one step) consistently  ADL  Overall ADL's  Needs assistance/impaired  Grooming Wash/dry hands;Wash/dry face;Set up;Supervision/safety;Sitting  Lower Body Bathing Minimal assistance;Sitting/lateral leans  Lower Body Bathing Details (indicate cue type and reason) on commode  Lower Body Dressing Sit to/from stand;Moderate assistance  Actuary Details (indicate cue type and reason) potentially able to complete without RW, demonstrating ability to push IV pole and manage RW today  Toileting- Clothing Manipulation and Hygiene Set up;Sit to/from stand;Sitting/lateral lean  Toileting - Clothing Manipulation Details (indicate cue type and reason) with warm wash cloths  General ADL Comments Pt engaged in grooming and bathing tasks while seated on bedside commode. Pt required step by step instructions to complete. Pt able to ambulate short distance to commode with min assist x 2.   Bed Mobility  Overal bed mobility Modified Independent  General bed mobility comments assist for line management only  Balance  Overall balance assessment Needs assistance  Sitting-balance support Feet supported  Sitting balance-Leahy Scale Normal  Standing balance-Leahy Scale Poor  Standing balance comment benefitted from RW today, likely able to complete without RW  Restrictions  Weight Bearing Restrictions No  Transfers  Overall transfer level Needs assistance  Equipment used Rolling walker (2 wheeled)  Transfers Sit to/from Stand  Sit to Stand Supervision  General transfer comment Supervision for safety from EOB and commode using grab bar cues for safety and line awareness  General Comments  General comments (skin integrity, edema, etc.) RR in the 30's and HR from 120-140 during session cues for PLB  OT - End of Session  Equipment Utilized During  Treatment Gait belt;Rolling walker  Activity  Tolerance Patient tolerated treatment well  Patient left in bed;with call bell/phone within reach;with nursing/sitter in room  Nurse Communication Mobility status  OT Assessment/Plan  OT Plan Discharge plan needs to be updated;Frequency remains appropriate  OT Visit Diagnosis Unsteadiness on feet (R26.81);Muscle weakness (generalized) (M62.81);Hemiplegia and hemiparesis;Cognitive communication deficit (R41.841)  Symptoms and signs involving cognitive functions Cerebral infarction  Hemiplegia - Right/Left Right  Hemiplegia - caused by Cerebral infarction  OT Frequency (ACUTE ONLY) Min 2X/week  Follow Up Recommendations Supervision/Assistance - 24 hour;SNF (CIR denied)  OT Equipment Other (comment);3 in 1 bedside commode  AM-PAC OT "6 Clicks" Daily Activity Outcome Measure (Version 2)  Help from another person eating meals? 3  Help from another person taking care of personal grooming? 3  Help from another person toileting, which includes using toliet, bedpan, or urinal? 3  Help from another person bathing (including washing, rinsing, drying)? 2  Help from another person to put on and taking off regular upper body clothing? 3  Help from another person to put on and taking off regular lower body clothing? 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

## 2019-04-14 NOTE — Progress Notes (Signed)
Physical Therapy Treatment Patient Details Name: Lauren Reyes MRN: [id number] DOB: 1990-05-03 Today's Date: 04/14/2019    History of Present Illness 29 year old female with suspected IV drug abuse who was being interviewed by police tonight and developed right-sided facial weakness, aphasia, and R weakness.  She was brought by EMS to the emergency room work-up revealed occluded left MCA. Revascularization performed by IR. CT head and neck demonstrate multiple small cavitary abscesses in the upper lobes bilaterally. IVC filter placement for RLE DVT on 3/26.    PT Comments    Pt has met goals and they have been updated.  Emphasis on improving gait quality, stability and stamina.    Follow Up Recommendations  Home health PT     Equipment Recommendations  Other (comment)(TBA)    Recommendations for Other Services       Precautions / Restrictions Precautions Precautions: Fall    Mobility  Bed Mobility Overal bed mobility: Modified Independent                Transfers Overall transfer level: Needs assistance   Transfers: Sit to/from Stand Sit to Stand: Supervision            Ambulation/Gait Ambulation/Gait assistance: Min guard;Min assist Gait Distance (Feet): 400 Feet Assistive device: Rolling walker (2 wheeled) Gait Pattern/deviations: Step-through pattern;Decreased stride length Gait velocity: decreased Gait velocity interpretation: 1.31 - 2.62 ft/sec, indicative of limited community ambulator General Gait Details: pt worked on improving heel/toe pattern.  mildly unsteady with some drift L/R, sagging trunk unless cued, 1to 2 deviations that needed minimal assist to recover.   Stairs             Wheelchair Mobility    Modified Rankin (Stroke Patients Only) Modified Rankin (Stroke Patients Only) Pre-Morbid Rankin Score: No symptoms Modified Rankin: Moderate disability     Balance Overall balance assessment: Needs assistance   Sitting  balance-Leahy Scale: Normal       Standing balance-Leahy Scale: Fair Standing balance comment: benefits from the RW                            Cognition Arousal/Alertness: Awake/alert Behavior During Therapy: WFL for tasks assessed/performed Overall Cognitive Status: Impaired/Different from baseline Area of Impairment: Following commands;Safety/judgement;Problem solving                       Following Commands: Follows one step commands consistently;Follows one step commands with increased time Safety/Judgement: Decreased awareness of safety;Decreased awareness of deficits   Problem Solving: Requires verbal cues;Difficulty sequencing        Exercises General Exercises - Lower Extremity Hip ABduction/ADduction: AROM;Both;10 reps;Standing Hip Flexion/Marching: AROM;Both;10 reps;Standing    General Comments        Pertinent Vitals/Pain Pain Assessment: No/denies pain Pain Intervention(s): Monitored during session    Home Living                      Prior Function            PT Goals (current goals can now be found in the care plan section) Acute Rehab PT Goals Patient Stated Goal: 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 PT Goal Formulation: With family Time For Goal Achievement: 04/18/19 Potential to Achieve Goals: Good Progress towards PT goals: Progressing toward goals    Frequency    Min 4X/week      PT Plan Current plan  remains appropriate    Co-evaluation              AM-PAC PT "6 Clicks" Mobility   Outcome Measure  Help needed turning from your back to your side while in a flat bed without using bedrails?: None Help needed moving from lying on your back to sitting on the side of a flat bed without using bedrails?: None Help needed moving to and from a bed to a chair (including a wheelchair)?: None Help needed standing up from a chair using your arms (e.g., wheelchair or bedside chair)?: None Help  needed to walk in hospital room?: A Little Help needed climbing 3-5 steps with a railing? : A Little 6 Click Score: 22    End of Session   Activity Tolerance: Patient tolerated treatment well Patient left: in bed;with call bell/phone within reach;with bed alarm set;with family/visitor present Nurse Communication: Mobility status PT Visit Diagnosis: Other abnormalities of gait and mobility (R26.89);Unsteadiness on feet (R26.81);Difficulty in walking, not elsewhere classified (R26.2)     Time: 4373-5789 PT Time Calculation (min) (ACUTE ONLY): 30 min  Charges:  $Gait Training: 8-22 mins $Therapeutic Exercise: 8-22 mins                     04/14/2019  Ginger Carne., PT Acute Rehabilitation Services [phone]  (pager) [phone]  (office)   Tessie Fass Meda Dudzinski 04/14/2019, 6:30 PM

## 2019-04-14 NOTE — Progress Notes (Signed)
Patient ID: Lauren Reyes, female   DOB: 1990-09-05, 29 y.o.   MRN: [id number] Patient ID: Lauren Reyes, female   DOB: February 24, 1990, 29 y.o.   MRN: [id number]         [facility]  Date of Admission:  04/01/2019   Total days of antibiotics 14         ASSESSMENT: She is making slow progress on antibiotic therapy for culture-negative endocarditis complicated by septic CNS emboli.  PLAN: 1. Continue vancomycin and ceftriaxone for 6 weeks total through 05/17/2019 (this will need to be given in a supervised setting)  Active Problems:   Infective endocarditis   Acute ischemic left MCA stroke ([place])   Middle cerebral artery embolism, left   Acute respiratory failure ([place])   DVT (deep venous thrombosis) ([place])   Encounter for central line placement   Endotracheal tube present   Cerebrovascular accident (CVA) due to embolism of precerebral artery ([place])   Normocytic anemia   Polysubstance abuse ([place])   IVDU (intravenous drug user)   Scheduled Meds: . aspirin  81 mg Oral Daily   Or  . aspirin  81 mg Per Tube Daily  . atorvastatin  80 mg Per Tube q1800  . chlorhexidine gluconate (MEDLINE KIT)  15 mL Mouth Rinse BID  . Chlorhexidine Gluconate Cloth  6 each Topical Daily  . feeding supplement  1 Container Oral TID BM  . metoprolol tartrate  12.5 mg Oral BID  . QUEtiapine  25 mg Oral QHS  . sodium chloride flush  10-40 mL Intracatheter Q12H  . ticagrelor  90 mg Oral BID  . vitamin B-12  1,000 mcg Per Tube Daily   Or  . vitamin B-12  1,000 mcg Oral Daily   Continuous Infusions: . sodium chloride Stopped (04/06/19 0609)  . cefTRIAXone (ROCEPHIN)  IV 2 g (04/14/19 1038)  . vancomycin 1,000 mg (04/14/19 0509)   PRN Meds:.sodium chloride, acetaminophen **OR** acetaminophen (TYLENOL) oral liquid 160 mg/5 mL **OR** acetaminophen, LORazepam, melatonin, ondansetron (ZOFRAN) IV, senna-docusate, sodium chloride flush   SUBJECTIVE: Apparently Rhiann was  very anxious and had multiple "panic attacks" and difficulty breathing yesterday.  She received Ativan.  Head CT showed evolution of known areas of stroke.  Chest x-ray showed worsening bilateral infiltrates.  She had no documented hypoxia.  Review of Systems: Review of Systems  Constitutional: Positive for malaise/fatigue. Negative for fever.  Respiratory: Negative for cough.   Cardiovascular: Negative for chest pain.  Gastrointestinal: Negative for abdominal pain, diarrhea, nausea and vomiting.  Psychiatric/Behavioral: The patient is nervous/anxious.     No Known Allergies  OBJECTIVE: Vitals:   04/14/19 0000 04/14/19 0400 04/14/19 0820 04/14/19 0932  BP: 123/60 (!) 114/47 (!) 99/56   Pulse:  (!) 121    Resp:   20   Temp:    98.3 F (36.8 C)  TempSrc: Axillary   Oral  SpO2:    100%  Weight:      Height:       Body mass index is 26.52 kg/m.  Physical Exam Constitutional:      Comments: She is much more alert than yesterday morning.  Her spirits are good.  She says she is not anxious currently but just recently got some medication for anxiety.  She says that she was feeling "overwhelmed"yesterday.  Cardiovascular:     Rate and Rhythm: Regular rhythm. Tachycardia present.     Heart sounds: Murmur present.     Comments: No change in systolic  diastolic murmur of aortic insufficiency. Pulmonary:     Effort: Pulmonary effort is normal.     Breath sounds: Normal breath sounds.     Comments: She is tachypneic with shallow respirations. Abdominal:     Palpations: Abdomen is soft.     Tenderness: There is no abdominal tenderness.  Musculoskeletal:        General: No swelling or tenderness.  Skin:    Findings: No rash.  Neurological:     Comments: Her speech is slurred but easily understandable.  Her right facial droop and right-sided weakness has resolved.  Psychiatric:        Mood and Affect: Mood normal.     Lab Results Lab Results  Component Value Date   WBC 12.1 (H)  04/14/2019   HGB 9.3 (L) 04/14/2019   HCT 29.4 (L) 04/14/2019   MCV 87.2 04/14/2019   PLT 483 (H) 04/14/2019    Lab Results  Component Value Date   CREATININE 0.68 04/13/2019   BUN 6 04/13/2019   NA 141 04/13/2019   K 4.0 04/13/2019   CL 111 04/13/2019   CO2 22 04/13/2019    Lab Results  Component Value Date   ALT 35 04/12/2019   AST 35 04/12/2019   ALKPHOS 71 04/12/2019   BILITOT 0.6 04/12/2019     Microbiology: No results found for this or any previous visit (from the past 240 hour(s)).  Michel Bickers, MD [facility] [place] [facility] [phone] pager   [phone] cell 04/14/2019, 12:29 PM

## 2019-04-14 NOTE — Progress Notes (Signed)
Patient noted with MEWS score at 3 upon arrival and increased to 4 secondary to increased RR and tachypnea. Patient reports feeling anxious. Anxiety mentioned in notes from Internal Medicine as well as Cardiothoracic teams-aware of increased HR. Ativan given. Scheduled to receive buspirone and metoprolol at 10pm. Will inform Rapid Response team and MD per protocol. Patient denies pain.  Continue ongoing monitoring and plan of care.

## 2019-04-15 ENCOUNTER — Encounter (HOSPITAL_COMMUNITY): Payer: Self-pay | Admitting: Neurology

## 2019-04-15 DIAGNOSIS — I5020 Unspecified systolic (congestive) heart failure: Secondary | ICD-10-CM

## 2019-04-15 DIAGNOSIS — I339 Acute and subacute endocarditis, unspecified: Secondary | ICD-10-CM

## 2019-04-15 DIAGNOSIS — I39 Endocarditis and heart valve disorders in diseases classified elsewhere: Secondary | ICD-10-CM

## 2019-04-15 DIAGNOSIS — F1910 Other psychoactive substance abuse, uncomplicated: Secondary | ICD-10-CM

## 2019-04-15 DIAGNOSIS — R000 Tachycardia, unspecified: Secondary | ICD-10-CM

## 2019-04-15 DIAGNOSIS — R0682 Tachypnea, not elsewhere classified: Secondary | ICD-10-CM

## 2019-04-15 LAB — BASIC METABOLIC PANEL
Anion gap: 9 (ref 5–15)
BUN: 5 mg/dL — ABNORMAL LOW (ref 6–20)
CO2: 23 mmol/L (ref 22–32)
Calcium: 8.3 mg/dL — ABNORMAL LOW (ref 8.9–10.3)
Chloride: 107 mmol/L (ref 98–111)
Creatinine, Ser: 0.64 mg/dL (ref 0.44–1.00)
GFR calc Af Amer: >60 mL/min (ref >60)
GFR calc non Af Amer: >60 mL/min (ref >60)
Glucose, Bld: 109 mg/dL — ABNORMAL HIGH (ref 70–99)
Potassium: 3.4 mmol/L — ABNORMAL LOW (ref 3.5–5.1)
Sodium: 139 mmol/L (ref 135–145)

## 2019-04-15 LAB — BRAIN NATRIURETIC PEPTIDE: B Natriuretic Peptide: 590.1 pg/mL — ABNORMAL HIGH (ref 0.0–100.0)

## 2019-04-15 LAB — CBC
HCT: 32 % — ABNORMAL LOW (ref 36.0–46.0)
Hemoglobin: 9.9 g/dL — ABNORMAL LOW (ref 12.0–15.0)
MCH: 27 pg (ref 26.0–34.0)
MCHC: 30.9 g/dL (ref 30.0–36.0)
MCV: 87.2 fL (ref 80.0–100.0)
Platelets: 607 10*3/uL — ABNORMAL HIGH (ref 150–400)
RBC: 3.67 MIL/uL — ABNORMAL LOW (ref 3.87–5.11)
RDW: 20.9 % — ABNORMAL HIGH (ref 11.5–15.5)
WBC: 11.1 10*3/uL — ABNORMAL HIGH (ref 4.0–10.5)
nRBC: 0 % (ref 0.0–0.2)

## 2019-04-15 LAB — VANCOMYCIN, TROUGH: Vancomycin Tr: 23 ug/mL (ref 15–20)

## 2019-04-15 MED ORDER — ESCITALOPRAM OXALATE 10 MG PO TABS
10.0000 mg | ORAL_TABLET | Freq: Every day | ORAL | Status: DC
  Administered 2019-04-15 – 2019-04-27 (×13): 10 mg via ORAL
  Filled 2019-04-15 (×13): qty 1

## 2019-04-15 MED ORDER — VANCOMYCIN HCL 750 MG/150ML IV SOLN
750.0000 mg | Freq: Three times a day (TID) | INTRAVENOUS | Status: DC
  Administered 2019-04-15 – 2019-04-28 (×40): 750 mg via INTRAVENOUS
  Administered 2019-04-28: 1250 mg via INTRAVENOUS
  Administered 2019-04-29 – 2019-05-19 (×61): 750 mg via INTRAVENOUS
  Filled 2019-04-15 (×106): qty 150

## 2019-04-15 NOTE — Progress Notes (Signed)
      [address]       [place] [address]             [phone]      8 Days Post-Op Procedure(s) (LRB): TRANSESOPHAGEAL ECHOCARDIOGRAM (TEE) (N/A) BUBBLE STUDY Subjective: Calmer and in better spirits this morning. Her RR has slowed back down to a normal range.  Objective: Vital signs in last 24 hours: Temp:  [98.3 F (36.8 C)] 98.3 F (36.8 C) (04/06 0932) Cardiac Rhythm: Sinus tachycardia (04/07 0700) Resp:  [15-35] 17 (04/07 0812) BP: (110-115)/(51-57) 115/57 (04/07 0406) SpO2:  [100 %] 100 % (04/07 0405)     Intake/Output from previous day: 04/06 0701 - 04/07 0700 In: 630 [P.O.:630] Out: 950 [Urine:950] Intake/Output this shift: No intake/output data recorded.  General appearance: alert, cooperative and no distress Heart: sinus tachycardia Lungs: clear to auscultation bilaterally Abdomen: soft, non-tender; bowel sounds normal; no masses,  no organomegaly Extremities: no edema   Lab Results: Recent Labs    04/13/19 0838 04/14/19 0500  WBC 13.5* 12.1*  HGB 10.3* 9.3*  HCT 32.9* 29.4*  PLT 665* 483*   BMET:  Recent Labs    04/13/19 0838  NA 141  K 4.0  CL 111  CO2 22  GLUCOSE 100*  BUN 6  CREATININE 0.68  CALCIUM 8.5*    PT/INR: No results for input(s): LABPROT, INR in the last 72 hours. ABG    Component Value Date/Time   PHART 7.347 (L) 04/02/2019 0032   HCO3 30.5 (H) 04/02/2019 0032   TCO2 32 04/02/2019 0032   O2SAT 100.0 04/02/2019 0032   CBG (last 3)  No results for input(s): GLUCAP in the last 72 hours.  Assessment/Plan: S/P Procedure(s) (LRB): TRANSESOPHAGEAL ECHOCARDIOGRAM (TEE) (N/A) BUBBLE STUDY  1. CV-sinus tachycardia, BP stable. Continue asa and statin.  2. Pulm-remains on room air with good oxygen saturation. Noted RR elevated yesterday but improved today.  3. Renal-creatinine 0.68, electrolytes okay-no new labs 4. H and H 9.3/29.4, continue to trend 5. Neuro-Hx of ischemia left MCA stroke 6.  Polysubstance abuse 7. Continue Brillinta 8. On 6 weeks of IV Vancomycin and Rocephin. Last dose 05/17/2019.  9. Hx of panic attacks before admission. They have significantly increased per the patient. Medicine started her on Buspar. Continue Ativan PRN.    Plan: Cardiology consulted to assist with care. No notes from [facility] team-please address. Anxiety appears to be more well controlled this morning. Will continue to follow along with you.      LOS: 14 days    [name] 04/15/2019

## 2019-04-15 NOTE — Progress Notes (Signed)
Physical Therapy Treatment Patient Details Name: Lauren Reyes MRN: [id number] DOB: Aug 08, 1990 Today's Date: 04/15/2019    History of Present Illness 29 year old female with suspected IV drug abuse who was being interviewed by police tonight and developed right-sided facial weakness, aphasia, and R weakness.  She was brought by EMS to the emergency room work-up revealed occluded left MCA. Revascularization performed by IR. CT head and neck demonstrate multiple small cavitary abscesses in the upper lobes bilaterally. IVC filter placement for RLE DVT on 3/26.    PT Comments    Patient seen for gait training and balance activities and tolerated session well. Pt agreeable to ambulation in room only this session. PT will continue to follow acutely and progress as tolerated.   Follow Up Recommendations  Home health PT     Equipment Recommendations  Other (comment)(TBA)    Recommendations for Other Services       Precautions / Restrictions Precautions Precautions: Fall Restrictions Weight Bearing Restrictions: No    Mobility  Bed Mobility Overal bed mobility: Independent                Transfers Overall transfer level: Modified independent   Transfers: Sit to/from Stand           General transfer comment: standing from EOB  Ambulation/Gait Ambulation/Gait assistance: Supervision;Min guard Gait Distance (Feet): 80 Feet Assistive device: None Gait Pattern/deviations: Step-through pattern;Decreased stride length;Narrow base of support Gait velocity: decreased   General Gait Details: pt agreeable to ambulating in room only today; worked on high level balance activities; grossly supervision; min guard when stepping backwards for safety   Stairs             Wheelchair Mobility    Modified Rankin (Stroke Patients Only) Modified Rankin (Stroke Patients Only) Pre-Morbid Rankin Score: No symptoms Modified Rankin: Moderate disability     Balance  Overall balance assessment: Needs assistance   Sitting balance-Leahy Scale: Normal       Standing balance-Leahy Scale: Fair               High level balance activites: Side stepping;Backward walking;Direction changes;Turns High Level Balance Comments: no LOB with balance activities; pt able to reach down and pick objects from floor without UE assist or LOB            Cognition Arousal/Alertness: Awake/alert Behavior During Therapy: WFL for tasks assessed/performed;Agitated Overall Cognitive Status: Impaired/Different from baseline Area of Impairment: Safety/judgement;Problem solving                         Safety/Judgement: Decreased awareness of safety;Decreased awareness of deficits   Problem Solving: Requires verbal cues;Difficulty sequencing        Exercises      General Comments        Pertinent Vitals/Pain Pain Assessment: No/denies pain    Home Living                      Prior Function            PT Goals (current goals can now be found in the care plan section) Progress towards PT goals: Progressing toward goals    Frequency    Min 4X/week      PT Plan Current plan remains appropriate    Co-evaluation              AM-PAC PT "6 Clicks" Mobility   Outcome Measure  Help needed turning from your back  to your side while in a flat bed without using bedrails?: None Help needed moving from lying on your back to sitting on the side of a flat bed without using bedrails?: None Help needed moving to and from a bed to a chair (including a wheelchair)?: None Help needed standing up from a chair using your arms (e.g., wheelchair or bedside chair)?: None Help needed to walk in hospital room?: None Help needed climbing 3-5 steps with a railing? : A Little 6 Click Score: 23    End of Session   Activity Tolerance: Patient tolerated treatment well Patient left: in bed;with call bell/phone within reach;Other (comment)(pt  sitting EOB eating lunch; RN notified bed alarm not on) Nurse Communication: Mobility status PT Visit Diagnosis: Other abnormalities of gait and mobility (R26.89);Unsteadiness on feet (R26.81);Difficulty in walking, not elsewhere classified (R26.2)     Time: 1345-1410 PT Time Calculation (min) (ACUTE ONLY): 25 min  Charges:  $Gait Training: 8-22 mins $Neuromuscular Re-education: 8-22 mins                     Erline Levine, PTA Acute Rehabilitation Services Pager: [phone] Office: [phone]     Carolynne Edouard 04/15/2019, 3:19 PM

## 2019-04-15 NOTE — Consult Note (Signed)
Cardiology Consultation:   Patient ID: Lauren Reyes MRN: [id number]; DOB: 15-Jan-1990  Admit date: 04/01/2019 Date of Consult: 04/15/2019  Primary Care Provider: Patient, No Pcp Per Primary Cardiologist: New (Dr. Radford Pax) Primary Electrophysiologist:  None   Patient Profile:   Lauren Reyes is a 29 y.o. female with no known past medical history prior to hospitalization other than IV drug who has had a prolonged hospitalization for acute CVA, felt to be due to septic emboli from IV drug use as well as pulmonary, renal, and splenic infarcts from septic embolic. She was then diagnosed with endocarditis of aortic, mitral, and tricuspid valves. Cardiology has been asked to see patient for further evaluation of endocarditis at the request of Nicholes Rough, PA-C (CT Surgery), given patient is not a surgical candidate for valve replacement at this time.   History of Present Illness:   [name] is a 29 year female with a history of tobacco use and IV drug use but no other known medical history prior to this admission. She reports smoking 1-2 packs of cigarettes every 2-3 days. She reports doing heroin and "ice" (crystal methamphetamine). She endorses prior cocaine use but states she has not used cocaine in a couple of years.   Patient presented to the ED via EMS on 04/01/2019 as Code Stroke after developing right sided deficits and aphasia. Per chart review, patient was staying at a local hotel and overstayed her visit. Police were called and when they arrived she started to develop slurred speech and right sided facial droop while talking on the phone. EMS was called and Code Stroke was called. She was found to have a left MCA proximal M3 branch occlusion. She was also noted to have findings in bilateral upper lobes consisting of multiple small abscesses. Therefore, CVA felt to most likely be secondary to septic emboli due to IV durg use. UDS positive for THC and amphetamines but heroin use  was also suspected given multiple needle marks. TPA was deferred due to concern for septic emboli.  She was intubated and underwent thrombectomy of MCA lesion with recanalization complicated by dissection/pseudoaneurysm of left ICA s/p stent on day of presentation. She was admitted to Neuro ICU following procedure. She initially required pressor after procedure to maintain BP. She was able to be extubated on 3/27. Following revascularization, she also developed right groin hematoma and her hemoglobin dropped requiring blood transfusions. Therefore, CT scan to rule out retroperitoneal hematoma and this was negative. GI was consulted; however, no intervention was felt to be needed given no GI bleeding. She was also found to have a DVT in her right lower extremity. She was not a candidate for anticoagulation due to history groin hematoma. Therefore, she underwent placement of IVC filter on 04/03/2019. Later found to have likely splenic and kidney infarcts as well from septic emboli.   There was high suspicion for endocarditis. Echo on 04/02/2019 showed LVEF of 55-60% with normal wall motion, normal diastolic function, and moderate AR. TEE on 04/07/2019 were suggestive of endocarditis of the aortic, mitral, and tricuspid valve with severe aortic regurgitation and tricuspid vegetation >2cm. No PFO was seen. CT surgery was consulted and felt she will likely require aortic valve replacement; however, this was complicated by recent stroke and risk of intracranial hemorrhage with full anticoagulation as well as need for dual antiplatelet therapy given stenting to ICA. Cardiology consulted to help with management given she is not a surgical candidate at this time.   At the time of  this evaluation, patient resting comfortably. She states she is not happy with the care she has received here and she thinks this is clouding her judgment about everything. She denies any chest pain. She does note some shortness of breath but it  seems like this are mainly when she has anxiety attacks. She denies any significant shortness of breath when ambulating when nursing staff. No orthopnea. Possibly a few episode of PND during this admission but not a lot and may be due to underlying pulmonary issues. She notes occasional palpitations but denies any at this time. No lightheadedness, dizziness, or syncope. No abdominal pain, nausea, vomiting. Right groin hematoma improving. No abnormal bleeding in urine or stools. Initially febrile but this has improved with antibiotics.   Past Medical History:  Diagnosis Date  . IV drug user   . Polysubstance abuse ([place])   . Tobacco use     Past Surgical History:  Procedure Laterality Date  . BUBBLE STUDY  04/07/2019   Procedure: BUBBLE STUDY;  Surgeon: Buford Dresser, MD;  Location: [place];  Service: Cardiovascular;;  . IR CT HEAD LTD  04/01/2019  . IR INTRAVSC STENT CERV CAROTID W/O EMB-PROT MOD SED INC ANGIO  04/01/2019  . IR IVC FILTER PLMT / S&I /IMG GUID/MOD SED  04/03/2019  . IR PERCUTANEOUS ART THROMBECTOMY/INFUSION INTRACRANIAL INC DIAG ANGIO  04/01/2019  . RADIOLOGY WITH ANESTHESIA N/A 04/01/2019   Procedure: IR WITH ANESTHESIA;  Surgeon: Luanne Bras, MD;  Location: [place];  Service: Radiology;  Laterality: N/A;  . TEE WITHOUT CARDIOVERSION N/A 04/07/2019   Procedure: TRANSESOPHAGEAL ECHOCARDIOGRAM (TEE);  Surgeon: Buford Dresser, MD;  Location: [facility] ENDOSCOPY;  Service: Cardiovascular;  Laterality: N/A;     Home Medications:  Prior to Admission medications   Not on File    Inpatient Medications: Scheduled Meds: . aspirin  81 mg Oral Daily   Or  . aspirin  81 mg Per Tube Daily  . atorvastatin  80 mg Per Tube q1800  . busPIRone  7.5 mg Oral BID  . chlorhexidine gluconate (MEDLINE KIT)  15 mL Mouth Rinse BID  . Chlorhexidine Gluconate Cloth  6 each Topical Daily  . escitalopram  10 mg Oral Daily  . feeding supplement  1 Container Oral TID BM  .  metoprolol tartrate  12.5 mg Oral BID  . QUEtiapine  25 mg Oral QHS  . sodium chloride flush  10-40 mL Intracatheter Q12H  . ticagrelor  90 mg Oral BID  . vitamin B-12  1,000 mcg Per Tube Daily   Or  . vitamin B-12  1,000 mcg Oral Daily   Continuous Infusions: . sodium chloride 1,000 mL (04/15/19 0409)  . cefTRIAXone (ROCEPHIN)  IV Stopped (04/15/19 1100)  . vancomycin 750 mg (04/15/19 1718)   PRN Meds: sodium chloride, acetaminophen **OR** acetaminophen (TYLENOL) oral liquid 160 mg/5 mL **OR** acetaminophen, LORazepam, melatonin, ondansetron (ZOFRAN) IV, senna-docusate, sodium chloride flush  Allergies:   No Known Allergies  Social History:   Social History   Socioeconomic History  . Marital status: Single    Spouse name: Not on file  . Number of children: Not on file  . Years of education: Not on file  . Highest education level: Not on file  Occupational History  . Not on file  Tobacco Use  . Smoking status: Never Smoker  . Smokeless tobacco: Never Used  Substance and Sexual Activity  . Alcohol use: Not on file  . Drug use: Not on file  . Sexual activity:  Not on file  Other Topics Concern  . Not on file  Social History Narrative  . Not on file   Social Determinants of Health   Financial Resource Strain:   . Difficulty of Paying Living Expenses:   Food Insecurity:   . Worried About Charity fundraiser in the Last Year:   . Arboriculturist in the Last Year:   Transportation Needs:   . Film/video editor (Medical):   Marland Kitchen Lack of Transportation (Non-Medical):   Physical Activity:   . Days of Exercise per Week:   . Minutes of Exercise per Session:   Stress:   . Feeling of Stress :   Social Connections:   . Frequency of Communication with Friends and Family:   . Frequency of Social Gatherings with Friends and Family:   . Attends Religious Services:   . Active Member of Clubs or Organizations:   . Attends Archivist Meetings:   Marland Kitchen Marital Status:     Intimate Partner Violence:   . Fear of Current or Ex-Partner:   . Emotionally Abused:   Marland Kitchen Physically Abused:   . Sexually Abused:     Family History:   Family History  Problem Relation Age of Onset  . Heart murmur Brother   . CAD Neg Hx   . Heart failure Neg Hx      ROS:  Please see the history of present illness.  All other ROS reviewed and negative.     Physical Exam/Data:   Vitals:   04/15/19 0812 04/15/19 0829 04/15/19 0918 04/15/19 1115  BP:  (!) 127/52 (!) 127/52 (!) 107/56  Pulse:   (!) 120 (!) 105  Resp: _0 Temp:  98.1 F (36.7 C)    TempSrc:  Oral    SpO2:  100% 99%   Weight:      Height:        Intake/Output Summary (Last 24 hours) at 04/15/2019 1806 Last data filed at 04/15/2019 1100 Gross per 24 hour  Intake 850 ml  Output 1100 ml  Net -250 ml   Last 3 Weights 04/01/2019 04/01/2019  Weight (lbs) 159 lb 6.3 oz 159 lb 6.3 oz  Weight (kg) 72.3 kg 72.3 kg     Body mass index is 26.52 kg/m.  General: 29 y.o. female resting comfortably in no acute distress. HEENT: Normocephalic and atraumatic. Sclera clear. EOMs intact. Neck: Supple. No carotid bruits. No JVD. Heart: Tachycardic with regular rhythm. Distinct S1 and S2. Diastolic murmur. No gallops or rubs. Radial and distal pedal pulses 2+ and equal bilaterally. Lungs: Tachypneic. Clear to ausculation bilaterally. No wheezes, rhonchi, or rales.  Abdomen: Soft, non-distended, and non-tender to palpation. Bowel sounds present. Extremities: No lower extremity edema.   Skin: Warm and dry. Neuro: Still has slurred speech from recent stroke. Facial droop has improved.  Psych: Somewhat flat affect. Responds appropriately.  EKG:  The EKG from 04/13/2019 was personally reviewed and demonstrates:  Sinus tachycardia, rate 120, with T wave inversion in aVL and biphasic T wave in V2. Telemetry:  Telemetry was personally reviewed and demonstrates:  Sinus tachycardia with rates in the 110's.   Relevant CV  Studies:  Echocardiogram 04/02/2019: Impression: 1. Normal LV function; moderate AI; mild MR and TR.  2. Left ventricular ejection fraction, by estimation, is 55 to 60%. The  left ventricle has normal function. The left ventricle has no regional  wall motion abnormalities. Left ventricular diastolic parameters were  normal.  3. Right ventricular systolic function is normal. The right ventricular  size is normal. There is mildly elevated pulmonary artery systolic  pressure.  4. The mitral valve is normal in structure. Mild mitral valve  regurgitation. No evidence of mitral stenosis.  5. The aortic valve is tricuspid. Aortic valve regurgitation is moderate.  Mild aortic valve sclerosis is present, with no evidence of aortic valve  stenosis.  6. The inferior vena cava is normal in size with greater than 50%  respiratory variability, suggesting right atrial pressure of 3 mmHg. 1. Normal LV function; moderate AI; mild MR and TR.  2. Left ventricular ejection fraction, by estimation, is 55 to 60%. The  left ventricle has normal function. The left ventricle has no regional  wall motion abnormalities. Left ventricular diastolic parameters were  normal.  3. Right ventricular systolic function is normal. The right ventricular  size is normal. There is mildly elevated pulmonary artery systolic  pressure.  4. The mitral valve is normal in structure. Mild mitral valve  regurgitation. No evidence of mitral stenosis.  5. The aortic valve is tricuspid. Aortic valve regurgitation is moderate.  Mild aortic valve sclerosis is present, with no evidence of aortic valve  stenosis.  6. The inferior vena cava is normal in size with greater than 50%  respiratory variability, suggesting right atrial pressure of 3 mmHg.  _______________  TEE 04/07/2019: Impressions: 1. Left ventricular ejection fraction, by estimation, is 55 to 60%. The  left ventricle has normal function. The left ventricle has  no regional  wall motion abnormalities.  2. Right ventricular systolic function is normal. The right ventricular  size is normal. There is moderately elevated pulmonary artery systolic  pressure.  3. No left atrial/left atrial appendage thrombus was detected.  4. Echodense small vegetation seen on atrial surface at A2-P2 leaflet  junction. . The mitral valve is abnormal. Mild mitral valve regurgitation.  No evidence of mitral stenosis.  5. Large mobile echodense structure projecting into right atrium.  Measures >2 cm in length. . The tricuspid valve is abnormal. Tricuspid  valve regurgitation is mild to moderate.  6. Moderately thickened. Abnormal structure, appears functionally  bicuspid vs. Tricuspid with partially fused leaflet. Irregular thickened  leaflet structure suggestive of endocarditis. Severe aortic regurgitation,  with the flow filling the entire LVOT.Marland Kitchen  The aortic valve has an indeterminant number of cusps. Aortic valve  regurgitation is severe.  7. Agitated saline contrast bubble study was positive with shunting  observed after >6 cardiac cycles suggestive of intrapulmonary shunting.   Conclusion(s)/Recommendation(s): Findings suggest endocarditis of the  aortic, mitral, and tricuspid valves. Aortic regurgitation is severe.  Tricuspid vegetation >2 cm. Would have CT surgery evaluate to see if she  is a surgical candidate. No PFO seen,  late positive bubble study suggests non-cardiac shunt.  Laboratory Data:  High Sensitivity Troponin:  No results for input(s): TROPONINIHS in the last 720 hours.   Chemistry Recent Labs  Lab 04/12/19 0423 04/13/19 0838 04/15/19 0843  NA 141 141 139  K 3.3* 4.0 3.4*  CL 111 111 107  CO2 21* 22 23  GLUCOSE 105* 100* 109*  BUN <5* 6 5*  CREATININE 0.55 0.68 0.64  CALCIUM 8.2* 8.5* 8.3*  GFRNONAA >60 >60 >60  GFRAA >60 >60 >60  ANIONGAP _0 Recent Labs  Lab 04/12/19 0423  PROT 6.1*  ALBUMIN 1.9*  AST 35  ALT  35  ALKPHOS 71  BILITOT 0.6   Hematology Recent  Labs  Lab 04/13/19 0838 04/14/19 0500 04/15/19 0843  WBC 13.5* 12.1* 11.1*  RBC 3.84* 3.37* 3.67*  HGB 10.3* 9.3* 9.9*  HCT 32.9* 29.4* 32.0*  MCV 85.7 87.2 87.2  MCH 26.8 27.6 27.0  MCHC 31.3 31.6 30.9  RDW 20.7* 20.8* 20.9*  PLT 665* 483* 607*   BNPNo results for input(s): BNP, PROBNP in the last 168 hours.  DDimer No results for input(s): DDIMER in the last 168 hours.   Radiology/Studies:  CT HEAD WO CONTRAST  Result Date: 04/12/2019 CLINICAL DATA:  Stroke, follow-up EXAM: CT HEAD WITHOUT CONTRAST TECHNIQUE: Contiguous axial images were obtained from the base of the skull through the vertex without intravenous contrast. COMPARISON:  04/01/2018, correlation made with MRI brain 04/02/2018 FINDINGS: Brain: Artifact is present on several slices. There is hypoattenuation with loss of gray-white differentiation along the left insula corresponding to recent infarction. Few additional ill-defined foci are present bilaterally likely corresponding to many small infarcts seen on MRI. Hypoattenuation within lateral left cerebellum is likely artifactual. Petechial hemorrhage on MRI is not visible. There is no discrete hematoma. Ventricles are stable in size. Vascular: No hyperdense vessel or unexpected calcification. A stent is seen within the distal cervical left ICA extending to the proximal petrous portion. Skull: Calvarium is unremarkable. Sinuses/Orbits: No acute finding. Other: None. IMPRESSION: Evolving areas of recent infarction better seen on prior MRI. Petechial hemorrhage is not visible. There is no discrete hematoma. Electronically Signed   By: Macy Mis M.D.   On: 04/12/2019 18:29   DG CHEST PORT 1 VIEW  Result Date: 04/12/2019 CLINICAL DATA:  Tachypnea. EXAM: PORTABLE CHEST 1 VIEW COMPARISON:  Radiograph 04/04/2019. CT 04/02/2019 FINDINGS: Right upper extremity PICC tip in the upper SVC. Interval extubation and removal of enteric  tube. Progressive heterogeneous bilateral lung opacities with central air bronchograms on the left. Probable small pleural effusions with hazy basilar opacities. Unchanged heart size and mediastinal contours. No pneumothorax. IMPRESSION: 1. Progressive heterogeneous bilateral lung opacities with central air bronchograms on the left, may be pulmonary edema, multifocal pneumonia or ARDS. 2. Suspected pleural effusions. Electronically Signed   By: Keith Rake M.D.   On: 04/12/2019 22:29    Assessment and Plan:   Endocarditis - Patient admitted with acute CVA felt to be due to septic embolic from IV drug use. Then found to have pulmonary, splenic, and renal infarcts as well as endocarditis. - TEE on 04/07/2019 showed endocarditis of aortic, mitral, and tricuspid valvith severe aortic regurgitation and tricuspid vegetation >2cm. Normal EF.  - CT surgery was consulted and felt like patient would likely require aortic valve replacement but is currently not a candidate due to recent stroke and risk of intracranial hemorrhage. Patient also on dual antiplatelet therapy (Apsirin and Brilinta) currently due to stenting of ICA on 04/01/2019 during thrombectomy.  - Do not think Cardiology has much to add at this point. Continue IV antibiotics (Vancomycin and Ceftriaxone) per ID.  - She does have a diastolic murmur on exam but EF normal and does not appear volume overloaded on exam. However, will check BNP to make sure there is not a CHF component to her tachypnea.   Sinus Tachycardia/Tachypnea - Rates in the 110's. Respiratory rates in the 20's to 30's at times. - Suspect physiologic response due to acute illness with endocarditis and septic emboli and will improve with continued treatment. Also likely a withdrawal component. UDS on 04/12/2019 positive for benzodiazepines. Patient also anemic.  - OK to continue Lopressor 12.71m twice  daily for now but would be careful with large doses of beta-blocker given severe  AR.   Otherwise, per primary team. - Acute CVA s/p thrombectomy complicated by focal dissection of left ICA treated with DES: on DAPT with Aspirin and Brilinta.  - Pulmonary, splenic, renal septic emboli - Right common femoral DVT s/p IVC  - Hepatitis C - Blood loss anemia - Polysubstance abuse - Anxiety  For questions or updates, please contact [place] Please consult [email or web address] for contact info under     Signed, Darreld Mclean, PA-C  04/15/2019 6:06 PM

## 2019-04-15 NOTE — Progress Notes (Signed)
Patient ID: Lauren Reyes, female   DOB: 12/27/1990, 29 y.o.   MRN: [id number]         [facility] for Infectious Disease  Date of Admission:  04/01/2019   Total days of antibiotics 15         ASSESSMENT: She continues to make progress on antibiotic therapy for culture-negative endocarditis complicated by septic CNS emboli.  PLAN: 1. Continue vancomycin and ceftriaxone for 6 weeks total through 05/17/2019 (this will need to be given in a supervised setting) 2. I will sign off now.  Please call if we can be of further assistance while she is here  Active Problems:   Infective endocarditis   Acute ischemic left MCA stroke ([place])   Middle cerebral artery embolism, left   Acute respiratory failure ([place])   DVT (deep venous thrombosis) ([place])   Encounter for central line placement   Endotracheal tube present   Cerebrovascular accident (CVA) due to embolism of precerebral artery (H[facility])   Normocytic anemia   Polysubstance abuse ([place])   IVDU (intravenous drug user)   Scheduled Meds: . aspirin  81 mg Oral Daily   Or  . aspirin  81 mg Per Tube Daily  . atorvastatin  80 mg Per Tube q1800  . busPIRone  7.5 mg Oral BID  . chlorhexidine gluconate (MEDLINE KIT)  15 mL Mouth Rinse BID  . Chlorhexidine Gluconate Cloth  6 each Topical Daily  . feeding supplement  1 Container Oral TID BM  . metoprolol tartrate  12.5 mg Oral BID  . QUEtiapine  25 mg Oral QHS  . sodium chloride flush  10-40 mL Intracatheter Q12H  . ticagrelor  90 mg Oral BID  . vitamin B-12  1,000 mcg Per Tube Daily   Or  . vitamin B-12  1,000 mcg Oral Daily   Continuous Infusions: . sodium chloride 1,000 mL (04/15/19 0409)  . cefTRIAXone (ROCEPHIN)  IV Stopped (04/15/19 1100)  . vancomycin Stopped (04/15/19 1022)   PRN Meds:.sodium chloride, acetaminophen **OR** acetaminophen (TYLENOL) oral liquid 160 mg/5 mL **OR** acetaminophen, LORazepam, melatonin, ondansetron (ZOFRAN) IV, senna-docusate, sodium chloride  flush   SUBJECTIVE: She is feeling better.  She says that she did 2 laps with the physical therapist.  Review of Systems: Review of Systems  Constitutional: Positive for malaise/fatigue. Negative for fever.  Respiratory: Negative for cough.   Cardiovascular: Negative for chest pain.  Gastrointestinal: Negative for abdominal pain, diarrhea, nausea and vomiting.  Psychiatric/Behavioral: The patient is nervous/anxious.     No Known Allergies  OBJECTIVE: Vitals:   04/15/19 0610 04/15/19 0812 04/15/19 0829 04/15/19 0918  BP:   (!) 127/52 (!) 127/52  Pulse:    (!) 120  Resp: '19 17 19 16  ' Temp:   98.1 F (36.7 C)   TempSrc:   Oral   SpO2:   100% 99%  Weight:      Height:       Body mass index is 26.52 kg/m.  Physical Exam Constitutional:      Comments: She is alert and calm today.  She seems to be in better spirits.  Cardiovascular:     Rate and Rhythm: Regular rhythm. Tachycardia present.     Heart sounds: Murmur present.     Comments: No change in systolic diastolic murmur of aortic insufficiency. Pulmonary:     Effort: Pulmonary effort is normal.     Breath sounds: Normal breath sounds.  Abdominal:     Palpations: Abdomen is soft.  Tenderness: There is no abdominal tenderness.  Musculoskeletal:        General: No swelling or tenderness.  Skin:    Findings: No rash.  Neurological:     Comments: Her speech remains slurred but easily understandable.    Psychiatric:        Mood and Affect: Mood normal.     Lab Results Lab Results  Component Value Date   WBC 11.1 (H) 04/15/2019   HGB 9.9 (L) 04/15/2019   HCT 32.0 (L) 04/15/2019   MCV 87.2 04/15/2019   PLT 607 (H) 04/15/2019    Lab Results  Component Value Date   CREATININE 0.64 04/15/2019   BUN 5 (L) 04/15/2019   NA 139 04/15/2019   K 3.4 (L) 04/15/2019   CL 107 04/15/2019   CO2 23 04/15/2019    Lab Results  Component Value Date   ALT 35 04/12/2019   AST 35 04/12/2019   ALKPHOS 71 04/12/2019    BILITOT 0.6 04/12/2019     Microbiology: No results found for this or any previous visit (from the past 240 hour(s)).  Michel Bickers, MD [facility] [place] [facility] [phone] pager   [phone] cell 04/15/2019, 12:30 PM

## 2019-04-15 NOTE — Progress Notes (Signed)
PROGRESS NOTE  Lauren Reyes QQP:[id number] DOB: 1990-11-20 DOA: 04/01/2019 PCP: Patient, No Pcp Per  Brief History   Lauren Wanjiku Ruchugois a 29 y.o.femalewith unknown past medical history, possible heroin abuse presents to the emergency department as a code stroke with aphasia and right-sided weakness.  [place] [employer] called to the hotel room where she was staying as she had overstated her visit,patient was  Found by PD talking on the phone and suddenly started to slur her speech, the right facial droop and had difficulty talking. When EMS arrived patient still communicating but slowly progressively got worse.   Patient was admitted to neurology ICU intubated after  Thrombectomy She underwent thrombectomy for Left MCA m3 occlusion with recanalization complicated by dissection/pseudoanerusym of left ICA s/p stent. She was extubated on March 27.   This morning nursing communicated that the patient had increased heart rate and respiratory rate. This has been an issue for many days. EKG demonstrated only sinus tachycardia. This is assumed to be due to deconditioning or withdrawal, probably from benzodiazepines for which the patient was positive on admission. Ativan has been made available on as needed basis and she has been prescribed scheduled Buspar. This seems to have helped her anxiety.  CTS has been consulted and has stated that the patient is not currently a candidate for surgery. However, they do state that surgery must be considered if the patient deteriorates acutely. Infectious disease is consulted. They have recommended continuing Vancomycin and ceftriaxone for a total of 6 weeks. The last dose is to be given on 05/17/2019. IV antibiotics must be given in a supervised setting.  Consultants  . PCCM . Gastroenterology . Cardiothoracic Surgery . Infectious disease.  Procedures  . TEE .  Antibiotics   Anti-infectives (From admission, onward)   Start     Dose/Rate  Route Frequency Ordered Stop   04/15/19 0900  vancomycin (VANCOREADY) IVPB 750 mg/150 mL     750 mg 150 mL/hr over 60 Minutes Intravenous Every 8 hours 04/15/19 0821     04/13/19 1400  vancomycin (VANCOCIN) IVPB 1000 mg/200 mL premix  Status:  Discontinued     1,000 mg 200 mL/hr over 60 Minutes Intravenous Every 8 hours 04/13/19 0839 04/15/19 0821   04/12/19 2200  vancomycin (VANCOREADY) IVPB 750 mg/150 mL  Status:  Discontinued     750 mg 150 mL/hr over 60 Minutes Intravenous Every 8 hours 04/12/19 1500 04/13/19 0839   04/10/19 1336  vancomycin (VANCOCIN) IVPB 1000 mg/200 mL premix  Status:  Discontinued     1,000 mg 200 mL/hr over 60 Minutes Intravenous Every 8 hours 04/10/19 1320 04/12/19 1500   04/08/19 1200  cefTRIAXone (ROCEPHIN) 2 g in sodium chloride 0.9 % 100 mL IVPB     2 g 200 mL/hr over 30 Minutes Intravenous Every 12 hours 04/08/19 1013 05/17/19 2359   04/08/19 0530  vancomycin (VANCOCIN) IVPB 1000 mg/200 mL premix  Status:  Discontinued     1,000 mg 200 mL/hr over 60 Minutes Intravenous Every 12 hours 04/07/19 1541 04/10/19 1320   04/07/19 1900  Ampicillin-Sulbactam (UNASYN) 3 g in sodium chloride 0.9 % 100 mL IVPB  Status:  Discontinued     3 g 200 mL/hr over 30 Minutes Intravenous Every 6 hours 04/07/19 1457 04/08/19 1013   04/07/19 1730  vancomycin (VANCOREADY) IVPB 1500 mg/300 mL     1,500 mg 150 mL/hr over 120 Minutes Intravenous  Once 04/07/19 1541 04/07/19 1937   04/04/19 1400  ceFAZolin (ANCEF) IVPB 2g/100  mL premix  Status:  Discontinued     2 g 200 mL/hr over 30 Minutes Intravenous Every 8 hours 04/04/19 1119 04/07/19 1457   04/04/19 0400  Vancomycin (VANCOCIN) 1,250 mg in sodium chloride 0.9 % 250 mL IVPB  Status:  Discontinued     1,250 mg 166.7 mL/hr over 90 Minutes Intravenous Every 12 hours 04/03/19 1635 04/04/19 1159   04/02/19 1800  ceFEPIme (MAXIPIME) 2 g in sodium chloride 0.9 % 100 mL IVPB  Status:  Discontinued     2 g 200 mL/hr over 30 Minutes  Intravenous Every 8 hours 04/02/19 1218 04/04/19 1127   04/02/19 1600  vancomycin (VANCOCIN) IVPB 1000 mg/200 mL premix  Status:  Discontinued     1,000 mg 200 mL/hr over 60 Minutes Intravenous Every 12 hours 04/02/19 0100 04/03/19 1635   04/02/19 0200  piperacillin-tazobactam (ZOSYN) IVPB 3.375 g  Status:  Discontinued     3.375 g 12.5 mL/hr over 240 Minutes Intravenous Every 8 hours 04/02/19 0059 04/02/19 1219   04/02/19 0200  vancomycin (VANCOCIN) 1,500 mg in sodium chloride 0.9 % 500 mL IVPB     1,500 mg 250 mL/hr over 120 Minutes Intravenous  Once 04/02/19 0100 04/02/19 0923   04/01/19 2040  ceFAZolin (ANCEF) 2-4 GM/100ML-% IVPB    Note to Pharmacy: [name]   : cabinet override      04/01/19 2040 04/02/19 0844      Subjective  The patient is resting comfortably. No new complaints.  Objective   Vitals:  Vitals:   04/15/19 0918 04/15/19 1115  BP: (!) 127/52 (!) 107/56  Pulse: (!) 120 (!) 105  Resp: 16 18  Temp:    SpO2: 99%    Exam:  Constitutional:  . The patient is awake, alert, and oriented x 3. No acute distress. Respiratory:  . No increased work of breathing. . No wheezes, rales, or rhonchi . No tactile fremitus Cardiovascular:  . Regular rate and rhythm . No murmurs, ectopy, or gallups. . No lateral PMI. No thrills. Abdomen:  . Abdomen is soft, non-tender, non-distended . No hernias, masses, or organomegaly . Normoactive bowel sounds.  Musculoskeletal:  . No cyanosis, clubbing, or edema Skin:  . No rashes, lesions, ulcers . palpation of skin: no induration or nodules Neurologic:  . No neurological deficits on exam. Psychiatric:  Pt appears more calm.   I have personally reviewed the following:   Today's Data  . Vitals, CBC, BMP  Micro Data  .  Culture Tracheal aspirate - MSSA  Imaging  . CT head . CT abdomen and pelvis  Cardiology Data  . EKG  Scheduled Meds: . aspirin  81 mg Oral Daily   Or  . aspirin  81 mg Per Tube Daily  .  atorvastatin  80 mg Per Tube q1800  . busPIRone  7.5 mg Oral BID  . chlorhexidine gluconate (MEDLINE KIT)  15 mL Mouth Rinse BID  . Chlorhexidine Gluconate Cloth  6 each Topical Daily  . feeding supplement  1 Container Oral TID BM  . metoprolol tartrate  12.5 mg Oral BID  . QUEtiapine  25 mg Oral QHS  . sodium chloride flush  10-40 mL Intracatheter Q12H  . ticagrelor  90 mg Oral BID  . vitamin B-12  1,000 mcg Per Tube Daily   Or  . vitamin B-12  1,000 mcg Oral Daily   Continuous Infusions: . sodium chloride 1,000 mL (04/15/19 0409)  . cefTRIAXone (ROCEPHIN)  IV Stopped (04/15/19 1100)  .  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  Septic emboli have been seen in left hemisphere of brain, in  lung, spleen with possible splenic and kidney infarcts. Initially the patient was treated with Vancomycin and Unasyn. She is currently receiving Vancomycin and Rocephin. Her fever has resolved. Per infectious disease the patient will require 6 weeks total of this regimen with her last dose being 05/17/2019. WBC has declined from 29+ to 13.5 today. The patient has been evaluated by thoracic surgery, Dr. Servando Snare: patient eventually will require aortic valve replacement, however this is complicated by recent stroke and recent stent on aspirin and Brilinta, as well as a recent decline in mental status.  Per Dr. Pia Mau, surgery should be reconsidered should the patient  developed worsening of heart failure syndrome and she has to be off Brilinta for 7 days. He has also requested psychiatric treatment program for IV drug use to be established before considering cardiac surgery intervention.  CVA: The patient has had a small infarct of left MCA territory. TPA was deferred as concern for septic emboli. The patient underwent a leftcommon carotid arteriogram followed by complete revascularization of occluded M3 region of inferior division ofleft MCA . She is also s/p placement of a 4.5 mm x 25m pipeline flow diverter at site of focal dissection associated with a small pseudoaneurysm of distal cervical LT ICA. She has been continued on ASA and Brillinta. A1c is 5.9, VLDL was 52. She is on lipitor 80 mg daily.  Hypokalemia: Supplement and monitor.  Dyslipidemia: With low HDL less than 10, high VLDL at 52, high triglyceride at 258. She is on Lipitor.  Sinus tachycardia/ Tachypnea: Drug withdrawal vs deconditioning. The patient has been started on as needed ativan.TSH 0.8. She is also receiving scheduled low-dose Lopressor with holding parameters  Right common femoral vein DVT: Not a candidate for anticoagulation due to large right groin hematoma post procedure and endocarditis. She has undergone placement of IVC filter on March 26 by IR.  Large right groin hematoma post procedure: Pseudoaneurysm addressed with S/P 24F angioseal for hemostasisat therightCFA access site. CBC stable.  Blood loss anemia, likely from right groin hematoma. Status post PRBC transfusion x2 on March 27. GI Dr GPenelope Coopconsulted on March 27 who do not think patient has any signs of GI bleed. Monitor CBC.  B12 deficiency: Supplement.  Hyponatremia: Present on admission. Now resolved. Monitor.  Hepatitis C:  Antibody positive, viral load negative. HIV screening negative RPR screening negative  Polysubstance abuse: UDS on presentation positive for THC amphetamine. Suspect IV drug use  with heroin, multiple needle marks bilateral upper extremities. Patient state IV drug use ongoing for many years now she is waiting to quit. Positive for benzodiazepines on 04/12/2019.  Anxiety versus drug withdrawal:  Positive for benzodiazepines on admission. She has an episode of crying, acting erratic on J[place] 4 a.m. Appears to be responding to as needed Ativan. UDS positive for  benzodiazepines on 04/12/2019. I have also started scheduled Buspar.   4:47 PM on 04/12/2019 RN report patient become more confused with increased slurred speech Stat CT head demonstrated evolving areas of recent infarction better seen on prior MRI. Petechial hemorrhage is not visible. There is no discrete hematoma. No acute interval changes.  Tachycardia: Due to deconditioning vs CMO due to polysubstance abuse  Debility: Per PT she needs assistant for bed mobility and transfers. She needs supervision by one person hand-held for ambulation and gait. She was able to ambulate 80 feet on [place] 2nd. Gait Pattern/deviations: Step-through pattern;Decreased stride length;Wide base of support;Trunk flexed. General Gait Details: Mild unsteadiness, pt reaching for furniture required HHA to improve balance. HR 131 bpm at it's highest.   The patient has been seen and examined by me. I have spent 32 minutes in her evaluation and care.  DVT Prophylaxis:scd's Code Status: full Family Communication: none present Disposition Plan: patient is from home. Discharge SNF vs home. Barriers to discharge. The patient will need to complete her 6 weeks of IV Vancomycin and Rocephin. Her last dose will be on 05/17/2019. She has infective endocarditis and septic emboli due to IV drug abuse. She also has no insurance although she may be able to get Medicaid.  Kady Toothaker, DO [facility] Direct contact: see [email or web address]  7PM-7AM contact night coverage as above 04/15/2019, 2:40 PM  LOS: 12 days

## 2019-04-15 NOTE — Progress Notes (Signed)
Pharmacy Antibiotic Note  Lauren Reyes is a 29 y.o. female admitted on 04/01/2019 as a code stroke with aphasia and right-sided weakness. Pharmacy has been consulted for vancomycin dosing.  TEE on 3/30 revealed severe endocarditis involving aortic, mitral, and tricuspid valves with severe aortic regurgitation. Patient's condition is further complicated by septic emboli to her brain, lungs, and spleen. Respiratory culture grew MSSA, however, recent blood cultures are negative. Appears to have received at least one dose of cefazolin prior to culture draw, but will plan to treat with vancomycin and ceftriaxone to cover MSSA and other causes of culture-negative endocarditis.  [name] is afebrile with WBC 11.1. SCr is stable at 0.64. A vancomycin trough this morning was slightly high at 23, drawn about 7.5 hours after the last dose. We will focus on trough based dosing for Ms. Vessels due to the concern of cerebritis.   Plan: -Decrease vancomycin to 750 mg IV q 8 hours  -Continue ceftriaxone IV 2g q12 her ID -Re-check trough at steady state  -Monitor renal function, clinical improvement, WBC, and temperature - End date for antibiotics: 05/17/19  Height: 5\' 5"  (165.1 cm) Weight: 72.3 kg (159 lb 6.3 oz) IBW/kg (Calculated) : 57  Temp (24hrs), Avg:98.1 F (36.7 C), Min:98.1 F (36.7 C), Max:98.1 F (36.7 C)  Recent Labs  Lab 04/09/19 2013 04/10/19 0512 04/11/19 0431 04/12/19 0423 04/12/19 0740 04/12/19 1335 04/13/19 0838 04/14/19 0500 04/15/19 0430 04/15/19 0843  WBC  --    < > 15.4* 15.0*  --   --  13.5* 12.1*  --  11.1*  CREATININE  --   --  0.41* 0.55  --   --  0.68  --   --  0.64  VANCOTROUGH  --    < >  --   --   --  14*  --   --  23*  --   VANCOPEAK 27*  --   --   --  46*  --   --   --   --   --    < > = values in this interval not displayed.    Estimated Creatinine Clearance: 102.4 mL/min (by C-G formula based on SCr of 0.64 mg/dL).    No Known  Allergies  Antimicrobials this admission: Zosyn 3/25 >> 3/25 Cefepime 3/25 >> 3/27 Ancef 3/27 >> 3/30 Vanc 3/25 >> 3/27; restarted 3/30 >> (5/9) Unasyn 3/30>>3/31 Ceftriaxone 3/31>>(5/9)  Microbiology results: 3/24 COVID/Flu >> neg 3/25 RCx >> moderate MSSA 3/25 MRSA PCR >> negative 3/25 BCx >> negative   Thank you for involving pharmacy in this patient's care.  4/25, PharmD, BCPS, BCIDP Infectious Diseases Clinical Pharmacist Phone: [phone] 04/15/2019 1:23 PM  **Pharmacist phone directory can be found on [email or web address] listed under [facility]**

## 2019-04-15 NOTE — Progress Notes (Signed)
Vancomycin trough drawn and sent to laboratory over a hour ago. Called lab to inquire about results. Technician states they have received blood but are extremely backed up. Results are pending.

## 2019-04-15 NOTE — Progress Notes (Signed)
Unable to draw BNP due to PICC line no longer returning blood. IV consult placed. W2 RN notified.

## 2019-04-15 NOTE — Plan of Care (Signed)
  Problem: Education: Goal: Knowledge of disease or condition will improve Outcome: Progressing Goal: Knowledge of secondary prevention will improve Outcome: Progressing Goal: Knowledge of patient specific risk factors addressed and post discharge goals established will improve Outcome: Progressing   Problem: Coping: Goal: Will verbalize positive feelings about self Outcome: Progressing Note: Pt stated. "I use to be the most articulate person, before this stroke, but I will get there again." Goal: Will identify appropriate support needs Outcome: Progressing   Problem: Self-Care: Goal: Ability to participate in self-care as condition permits will improve Outcome: Progressing Goal: Verbalization of feelings and concerns over difficulty with self-care will improve Outcome: Progressing Goal: Ability to communicate needs accurately will improve Outcome: Progressing   Problem: Nutrition: Goal: Adequate nutrition will be maintained Outcome: Progressing   Problem: Coping: Goal: Level of anxiety will decrease Outcome: Progressing   Problem: Elimination: Goal: Will not experience complications related to bowel motility Outcome: Progressing Goal: Will not experience complications related to urinary retention Outcome: Progressing   Problem: Pain Managment: Goal: General experience of comfort will improve Outcome: Progressing

## 2019-04-16 ENCOUNTER — Other Ambulatory Visit: Payer: Self-pay

## 2019-04-16 DIAGNOSIS — I351 Nonrheumatic aortic (valve) insufficiency: Secondary | ICD-10-CM

## 2019-04-16 DIAGNOSIS — I339 Acute and subacute endocarditis, unspecified: Secondary | ICD-10-CM

## 2019-04-16 NOTE — Progress Notes (Signed)
  Speech Language Pathology Treatment: Cognitive-Linquistic  Patient Details Name: Lauren Reyes MRN: [id number] DOB: 07/20/1990 Today's Date: 04/16/2019 Time: 7616-0737 SLP Time Calculation (min) (ACUTE ONLY): 13 min  Assessment / Plan / Recommendation Clinical Impression  Pt was seen for skilled ST targeting dysarthria.  Pt was encountered awake/alert with boyfriend present at bedside.  He left upon SLP arrival.  Pt was agreeable to this tx session.  She reported that her speech intelligibility was slowly improving, but that it was not anywhere close to her baseline.  Pt was unable to recall speech intelligibility strategies so SLP re-educated regarding the following: 1) Slow 2) Loud 3) Over-articulate 4) Pause.  Pt would benefit from using the over-articulation strategy in particular.  Pt completed word and phrase repetition tasks in addition to melodic repetition tasks while using the speech intelligibility strategies.  Pt demonstrated the most difficulty with consonant blends /sh/, /sl/, etc. and the phoneme /r/ in the initial position.  Pt was most intelligible with CVC words that started with a bilabial such as /b/ and /p/.  Some hypernasality was observed during repetition tasks. Speech was approximately 85% intelligible to an unfamiliar listener.  At the end of this session, pt reported that she felt hot and dizzy.  RN was made aware and she proceeded to take the pt's vitals.  Recommend additional ST targeting dysarthria at the outpatient level at time of discharge.  SLP will f/u per POC.     HPI HPI: Pt is a 29 yo female with suspected IVDU admitted with sudden onset R sided weakness and aphasia, now s/p revascularization by IR of L MCA. MRI showed numerous smaller acute infarcts scattered throughout the brain bilaterally. ETT 3/24-3/27.      SLP Plan  Continue with current plan of care       Recommendations                   Oral Care Recommendations: Oral care  BID Follow up Recommendations: Outpatient SLP SLP Visit Diagnosis: Dysarthria and anarthria (R47.1) Plan: Continue with current plan of care        Villa Herb M.S., CCC-SLP Acute Rehabilitation Services Office: [phone]  Shanon Rosser [facility] 04/16/2019, 3:03 PM

## 2019-04-16 NOTE — Progress Notes (Signed)
The chaplain visited with the patient as a result of a consult for prayer. The patient talked to the chaplain about how they had a faith in God but knew they needed to fix some things in their life. The patient asked for a prayer for healing after having a stroke. The chaplain prayed for the patient. The chaplain is available for follow-up if needed.  Lavone Neri Chaplain Resident For questions concerning this note please contact me by pager [phone]

## 2019-04-16 NOTE — Progress Notes (Addendum)
      [address]       [place] [address]             [phone]      9 Days Post-Op Procedure(s) (LRB): TRANSESOPHAGEAL ECHOCARDIOGRAM (TEE) (N/A) BUBBLE STUDY Subjective: Feels okay this morning. Not short of breath.   Objective: Vital signs in last 24 hours: Temp:  [98.1 F (36.7 C)-98.5 F (36.9 C)] 98.3 F (36.8 C) (04/08 0715) Pulse Rate:  [105-122] 110 (04/08 0715) Cardiac Rhythm: Sinus tachycardia (04/08 0700) Resp:  [16-29] 16 (04/08 0715) BP: (101-127)/(52-59) 111/53 (04/08 0715) SpO2:  [98 %-99 %] 98 % (04/08 0715)    Intake/Output from previous day: 04/07 0701 - 04/08 0700 In: 1861.9 [P.O.:1080; I.V.:31.9; IV Piggyback:750] Out: 600 [Urine:600] Intake/Output this shift: No intake/output data recorded.  General appearance: alert, cooperative and no distress Heart: sinus tachycardia, + murmur Lungs: clear to auscultation bilaterally Abdomen: soft, non-tender; bowel sounds normal; no masses,  no organomegaly Extremities: extremities normal, atraumatic, no cyanosis or edema Wound: n/a  Lab Results: Recent Labs    04/14/19 0500 04/15/19 0843  WBC 12.1* 11.1*  HGB 9.3* 9.9*  HCT 29.4* 32.0*  PLT 483* 607*   BMET:  Recent Labs    04/13/19 0838 04/15/19 0843  NA 141 139  K 4.0 3.4*  CL 111 107  CO2 22 23  GLUCOSE 100* 109*  BUN 6 5*  CREATININE 0.68 0.64  CALCIUM 8.5* 8.3*    PT/INR: No results for input(s): LABPROT, INR in the last 72 hours. ABG    Component Value Date/Time   PHART 7.347 (L) 04/02/2019 0032   HCO3 30.5 (H) 04/02/2019 0032   TCO2 32 04/02/2019 0032   O2SAT 100.0 04/02/2019 0032   CBG (last 3)  No results for input(s): GLUCAP in the last 72 hours.  Assessment/Plan: S/P Procedure(s) (LRB): TRANSESOPHAGEAL ECHOCARDIOGRAM (TEE) (N/A) BUBBLE STUDY  1. CV-sinus tachycardia, BP stable. Continue asa and statin. Appreciate Cardiology's assistant.  2. Pulm-remains on room air with good oxygen saturation.   3. Renal-creatinine 0.64, electrolytes okay-no new labs 4. H and H 9.9/32.0, continue to trend 5. Neuro-Hx of ischemia left MCA stroke 6. Polysubstance abuse 7. Continue Brillinta 8. On 6 weeks of IV Vancomycin and Rocephin. Last dose 05/17/2019. 9. Hx of panic attacks before admission. They have significantly increased per the patient. Medicine started her on Buspar and Lexapro. Continue Ativan PRN.   Plan: Asking for chaplain services. Feeling less anxious today. Continue current care plan.     LOS: 15 days    Lauren Reyes 04/16/2019  I have seen and examined Lauren Reyes and agree with the above assessment  and plan.  Delight Ovens MD Beeper [phone] Office [phone] 04/16/2019 2:00 PM

## 2019-04-16 NOTE — Progress Notes (Addendum)
Progress Note  Patient Name: Lauren Reyes Date of Encounter: 04/16/2019  Primary Cardiologist: No primary care provider on file.   Subjective   Denies any chest pain or SOB.  Speech improving slowly  Inpatient Medications    Scheduled Meds: . aspirin  81 mg Oral Daily   Or  . aspirin  81 mg Per Tube Daily  . atorvastatin  80 mg Per Tube q1800  . busPIRone  7.5 mg Oral BID  . chlorhexidine gluconate (MEDLINE KIT)  15 mL Mouth Rinse BID  . Chlorhexidine Gluconate Cloth  6 each Topical Daily  . escitalopram  10 mg Oral Daily  . feeding supplement  1 Container Oral TID BM  . metoprolol tartrate  12.5 mg Oral BID  . QUEtiapine  25 mg Oral QHS  . sodium chloride flush  10-40 mL Intracatheter Q12H  . ticagrelor  90 mg Oral BID  . vitamin B-12  1,000 mcg Per Tube Daily   Or  . vitamin B-12  1,000 mcg Oral Daily   Continuous Infusions: . sodium chloride 10 mL/hr at 04/15/19 2255  . cefTRIAXone (ROCEPHIN)  IV 2 g (04/16/19 1120)  . vancomycin Stopped (04/16/19 1118)   PRN Meds: sodium chloride, acetaminophen **OR** acetaminophen (TYLENOL) oral liquid 160 mg/5 mL **OR** acetaminophen, LORazepam, melatonin, ondansetron (ZOFRAN) IV, senna-docusate, sodium chloride flush   Vital Signs    Vitals:   04/15/19 2326 04/16/19 0317 04/16/19 0715 04/16/19 1124  BP: (!) 109/54 (!) 117/59 (!) 111/53 (!) 118/58  Pulse: (!) 122 (!) 110 (!) 110 (!) 108  Resp: (!) '29 20 16 18  ' Temp: 98.3 F (36.8 C) 98.1 F (36.7 C) 98.3 F (36.8 C) 98.1 F (36.7 C)  TempSrc: Oral Oral Oral Oral  SpO2: 99% 99% 98% 97%  Weight:      Height:        Intake/Output Summary (Last 24 hours) at 04/16/2019 1217 Last data filed at 04/16/2019 0943 Gross per 24 hour  Intake 1611.87 ml  Output --  Net 1611.87 ml   Filed Weights   04/01/19 1900 04/01/19 2023  Weight: 72.3 kg 72.3 kg    Telemetry    NSR - Personally Reviewed  ECG    No new EKG to review - Personally Reviewed  Physical Exam    GEN: No acute distress.   Neck: No JVD Cardiac: RRR, no rubs, or gallops. 2/6 diastolic murmur at RUSB to LLSB Respiratory: Clear to auscultation bilaterally. GI: Soft, nontender, non-distended  MS: No edema; No deformity. Neuro:  Nonfocal  Psych: Normal affect   Labs    Chemistry Recent Labs  Lab 04/12/19 0423 04/13/19 0838 04/15/19 0843  NA 141 141 139  K 3.3* 4.0 3.4*  CL 111 111 107  CO2 21* 22 23  GLUCOSE 105* 100* 109*  BUN <5* 6 5*  CREATININE 0.55 0.68 0.64  CALCIUM 8.2* 8.5* 8.3*  PROT 6.1*  --   --   ALBUMIN 1.9*  --   --   AST 35  --   --   ALT 35  --   --   ALKPHOS 71  --   --   BILITOT 0.6  --   --   GFRNONAA >60 >60 >60  GFRAA >60 >60 >60  ANIONGAP '9 8 9     ' Hematology Recent Labs  Lab 04/13/19 0838 04/14/19 0500 04/15/19 0843  WBC 13.5* 12.1* 11.1*  RBC 3.84* 3.37* 3.67*  HGB 10.3* 9.3* 9.9*  HCT 32.9*  29.4* 32.0*  MCV 85.7 87.2 87.2  MCH 26.8 27.6 27.0  MCHC 31.3 31.6 30.9  RDW 20.7* 20.8* 20.9*  PLT 665* 483* 607*    Cardiac EnzymesNo results for input(s): TROPONINI in the last 168 hours. No results for input(s): TROPIPOC in the last 168 hours.   BNP Recent Labs  Lab 04/15/19 2044  BNP 590.1*     DDimer No results for input(s): DDIMER in the last 168 hours.   Radiology    No results found.  Cardiac Studies   TEE 04/07/2019 IMPRESSIONS    1. Left ventricular ejection fraction, by estimation, is 55 to 60%. The  left ventricle has normal function. The left ventricle has no regional  wall motion abnormalities.  2. Right ventricular systolic function is normal. The right ventricular  size is normal. There is moderately elevated pulmonary artery systolic  pressure.  3. No left atrial/left atrial appendage thrombus was detected.  4. Echodense small vegetation seen on atrial surface at A2-P2 leaflet  junction. . The mitral valve is abnormal. Mild mitral valve regurgitation.  No evidence of mitral stenosis.  5. Large  mobile echodense structure projecting into right atrium.  Measures >2 cm in length. . The tricuspid valve is abnormal. Tricuspid  valve regurgitation is mild to moderate.  6. Moderately thickened. Abnormal structure, appears functionally  bicuspid vs. Tricuspid with partially fused leaflet. Irregular thickened  leaflet structure suggestive of endocarditis. Severe aortic regurgitation,  with the flow filling the entire LVOT.Marland Kitchen  The aortic valve has an indeterminant number of cusps. Aortic valve  regurgitation is severe.  7. Agitated saline contrast bubble study was positive with shunting  observed after >6 cardiac cycles suggestive of intrapulmonary shunting.   Conclusion(s)/Recommendation(s): Findings suggest endocarditis of the  aortic, mitral, and tricuspid valves. Aortic regurgitation is severe.  Tricuspid vegetation >2 cm. Would have CT surgery evaluate to see if she  is a surgical candidate. No PFO seen,  late positive bubble study suggests non-cardiac shunt.   Patient Profile     29 y.o. female with no known past medical history prior to hospitalization other than IV drug who has had a prolonged hospitalization for acute CVA, felt to be due to septic emboli from IV drug use as well as pulmonary, renal, and splenic infarcts from septic embolic. She was then diagnosed with endocarditis of aortic, mitral, and tricuspid valves. Cardiology has been asked to see patient for further evaluation of endocarditis at the request of Nicholes Rough, PA-C (CT Surgery), given patient is not a surgical candidate for valve replacement at this time.   Assessment & Plan    Endocarditis - Patient admitted with acute CVA felt to be due to septic embolic from IV drug use. Then found to have pulmonary, splenic, and renal infarcts as well as endocarditis. - TEE on 04/07/2019 showed endocarditis of aortic, mitral, and tricuspid valvith severe aortic regurgitation and tricuspid vegetation >2cm. Normal EF.  - CT  surgery was consulted and felt like patient would likely require aortic valve replacement but is currently not a candidate due to recent stroke and risk of intracranial hemorrhage. Patient also on dual antiplatelet therapy (Apsirin and Brilinta) currently due to stenting of ICA on 04/01/2019 during thrombectomy.  -Continue IV antibiotics (Vancomycin and Ceftriaxone) per ID.  -She does have a diastolic murmur on exam but EF normal.  BNP mildly elevated at 590 but does not appear volume overloaded on exam.  -she remains hemodynamically stable  Sinus Tachycardia/Tachypnea - Rates in the  110's. Respiratory rates in the 20's to 30's at times. - Suspect physiologic response due to acute illness with endocarditis and septic emboli and will improve with continued treatment. Also likely a withdrawal component. UDS on 04/12/2019 positive for benzodiazepines. Patient also anemic.  - OK to continue Lopressor 12.89m twice daily for now but would be careful with large doses of beta-blocker given severe AR.   Otherwise, per primary team. - Acute CVA s/p thrombectomy complicated by focal dissection of left ICA treated with DES: on DAPT with Aspirin and Brilinta.  - Pulmonary, splenic, renal septic emboli - Right common femoral DVT s/p IVC  - Hepatitis C - Blood loss anemia - Polysubstance abuse - Anxiety  We will see again Monday - please call over the weekend with any concerns     For questions or updates, please contact [facility] Please consult [email or web address] for contact info under Cardiology/STEMI.      Signed, TFransico Him MD  04/16/2019, 12:17 PM

## 2019-04-16 NOTE — Progress Notes (Signed)
PROGRESS NOTE  Lauren Reyes QQP:[id number] DOB: 1990-11-20 DOA: 04/01/2019 PCP: Patient, No Pcp Per  Brief History   Lauren Reyes a 29 y.o.femalewith unknown past medical history, possible heroin abuse presents to the emergency department as a code stroke with aphasia and right-sided weakness.  [place] PD called to the hotel room where she was staying as she had overstated her visit,patient was  Found by PD talking on the phone and suddenly started to slur her speech, the right facial droop and had difficulty talking. When EMS arrived patient still communicating but slowly progressively got worse.   Patient was admitted to neurology ICU intubated after  Thrombectomy She underwent thrombectomy for Left MCA m3 occlusion with recanalization complicated by dissection/pseudoanerusym of left ICA s/p stent. She was extubated on March 27.   This morning nursing communicated that the patient had increased heart rate and respiratory rate. This has been an issue for many days. EKG demonstrated only sinus tachycardia. This is assumed to be due to deconditioning or withdrawal, probably from benzodiazepines for which the patient was positive on admission. Ativan has been made available on as needed basis and she has been prescribed scheduled Buspar. This seems to have helped her anxiety.  CTS has been consulted and has stated that the patient is not currently a candidate for surgery. However, they do state that surgery must be considered if the patient deteriorates acutely. Infectious disease is consulted. They have recommended continuing Vancomycin and ceftriaxone for a total of 6 weeks. The last dose is to be given on 05/17/2019. IV antibiotics must be given in a supervised setting.  Consultants  . PCCM . Gastroenterology . Cardiothoracic Surgery . Infectious disease.  Procedures  . TEE .  Antibiotics   Anti-infectives (From admission, onward)   Start     Dose/Rate  Route Frequency Ordered Stop   04/15/19 0900  vancomycin (VANCOREADY) IVPB 750 mg/150 mL     750 mg 150 mL/hr over 60 Minutes Intravenous Every 8 hours 04/15/19 0821     04/13/19 1400  vancomycin (VANCOCIN) IVPB 1000 mg/200 mL premix  Status:  Discontinued     1,000 mg 200 mL/hr over 60 Minutes Intravenous Every 8 hours 04/13/19 0839 04/15/19 0821   04/12/19 2200  vancomycin (VANCOREADY) IVPB 750 mg/150 mL  Status:  Discontinued     750 mg 150 mL/hr over 60 Minutes Intravenous Every 8 hours 04/12/19 1500 04/13/19 0839   04/10/19 1336  vancomycin (VANCOCIN) IVPB 1000 mg/200 mL premix  Status:  Discontinued     1,000 mg 200 mL/hr over 60 Minutes Intravenous Every 8 hours 04/10/19 1320 04/12/19 1500   04/08/19 1200  cefTRIAXone (ROCEPHIN) 2 g in sodium chloride 0.9 % 100 mL IVPB     2 g 200 mL/hr over 30 Minutes Intravenous Every 12 hours 04/08/19 1013 05/17/19 2359   04/08/19 0530  vancomycin (VANCOCIN) IVPB 1000 mg/200 mL premix  Status:  Discontinued     1,000 mg 200 mL/hr over 60 Minutes Intravenous Every 12 hours 04/07/19 1541 04/10/19 1320   04/07/19 1900  Ampicillin-Sulbactam (UNASYN) 3 g in sodium chloride 0.9 % 100 mL IVPB  Status:  Discontinued     3 g 200 mL/hr over 30 Minutes Intravenous Every 6 hours 04/07/19 1457 04/08/19 1013   04/07/19 1730  vancomycin (VANCOREADY) IVPB 1500 mg/300 mL     1,500 mg 150 mL/hr over 120 Minutes Intravenous  Once 04/07/19 1541 04/07/19 1937   04/04/19 1400  ceFAZolin (ANCEF) IVPB 2g/100  mL premix  Status:  Discontinued     2 g 200 mL/hr over 30 Minutes Intravenous Every 8 hours 04/04/19 1119 04/07/19 1457   04/04/19 0400  Vancomycin (VANCOCIN) 1,250 mg in sodium chloride 0.9 % 250 mL IVPB  Status:  Discontinued     1,250 mg 166.7 mL/hr over 90 Minutes Intravenous Every 12 hours 04/03/19 1635 04/04/19 1159   04/02/19 1800  ceFEPIme (MAXIPIME) 2 g in sodium chloride 0.9 % 100 mL IVPB  Status:  Discontinued     2 g 200 mL/hr over 30 Minutes  Intravenous Every 8 hours 04/02/19 1218 04/04/19 1127   04/02/19 1600  vancomycin (VANCOCIN) IVPB 1000 mg/200 mL premix  Status:  Discontinued     1,000 mg 200 mL/hr over 60 Minutes Intravenous Every 12 hours 04/02/19 0100 04/03/19 1635   04/02/19 0200  piperacillin-tazobactam (ZOSYN) IVPB 3.375 g  Status:  Discontinued     3.375 g 12.5 mL/hr over 240 Minutes Intravenous Every 8 hours 04/02/19 0059 04/02/19 1219   04/02/19 0200  vancomycin (VANCOCIN) 1,500 mg in sodium chloride 0.9 % 500 mL IVPB     1,500 mg 250 mL/hr over 120 Minutes Intravenous  Once 04/02/19 0100 04/02/19 0923   04/01/19 2040  ceFAZolin (ANCEF) 2-4 GM/100ML-% IVPB    Note to Pharmacy: [name]   : cabinet override      04/01/19 2040 04/02/19 0844      Subjective  The patient is resting comfortably. No new complaints.  Objective   Vitals:  Vitals:   04/16/19 0715 04/16/19 1124  BP: (!) 111/53 (!) 118/58  Pulse: (!) 110 (!) 108  Resp: 16 18  Temp: 98.3 F (36.8 C) 98.1 F (36.7 C)  SpO2: 98% 97%   Exam:  Constitutional:  . The patient is awake, alert, and oriented x 3. No acute distress. Respiratory:  . No increased work of breathing. . No wheezes, rales, or rhonchi . No tactile fremitus Cardiovascular:  . Regular rate and rhythm . No murmurs, ectopy, or gallups. . No lateral PMI. No thrills. Abdomen:  . Abdomen is soft, non-tender, non-distended . No hernias, masses, or organomegaly . Normoactive bowel sounds.  Musculoskeletal:  . No cyanosis, clubbing, or edema Skin:  . No rashes, lesions, ulcers . palpation of skin: no induration or nodules Neurologic:  . No neurological deficits on exam. Psychiatric:  Pt appears more calm.   I have personally reviewed the following:   Today's Data  . Vitals, BNP  Micro Data  .  Culture Tracheal aspirate - MSSA  Imaging  . CT head . CT abdomen and pelvis  Cardiology Data  . EKG  Scheduled Meds: . aspirin  81 mg Oral Daily   Or  .  aspirin  81 mg Per Tube Daily  . atorvastatin  80 mg Per Tube q1800  . busPIRone  7.5 mg Oral BID  . chlorhexidine gluconate (MEDLINE KIT)  15 mL Mouth Rinse BID  . Chlorhexidine Gluconate Cloth  6 each Topical Daily  . escitalopram  10 mg Oral Daily  . feeding supplement  1 Container Oral TID BM  . metoprolol tartrate  12.5 mg Oral BID  . QUEtiapine  25 mg Oral QHS  . sodium chloride flush  10-40 mL Intracatheter Q12H  . ticagrelor  90 mg Oral BID  . vitamin B-12  1,000 mcg Per Tube Daily   Or  . vitamin B-12  1,000 mcg Oral Daily   Continuous Infusions: . sodium chloride 10  mL/hr at 04/15/19 2255  . cefTRIAXone (ROCEPHIN)  IV Stopped (04/16/19 1155)  . vancomycin Stopped (04/16/19 1118)    Active Problems:   Acute ischemic left MCA stroke (Lauren Reyes)   Middle cerebral artery embolism, left   Acute respiratory failure (HCC)   DVT (deep venous thrombosis) ([place])   Encounter for central line placement   Endotracheal tube present   Cerebrovascular accident (CVA) due to embolism of precerebral artery (HCC)   Infective endocarditis   Normocytic anemia   Polysubstance abuse ([place])   IVDU (intravenous drug user)   Systolic congestive heart failure, NYHA class 1 ([place])   Tachypnea   LOS: 15 days   A & P   Culture negative endocarditis involving multiple heart valves: Tracheal aspirate  has grown out MSSA.  Septic emboli have been seen in left hemisphere of brain, in  lung, spleen with possible splenic and kidney infarcts. Initially the patient was treated with Vancomycin and Unasyn. She is currently receiving Vancomycin and Rocephin. Her fever has resolved. Per infectious disease the patient will require 6 weeks total of this regimen with her last dose being 05/17/2019. WBC has declined from 29+ to 13.5 today. The patient has been evaluated by thoracic surgery, Dr. Servando Snare: patient eventually will require aortic valve replacement, however this is complicated by recent stroke and recent stent  on aspirin and Brilinta, as well as a recent decline in mental status.  Per Dr. Pia Mau, surgery should be reconsidered should the patient developed worsening of heart failure syndrome and she has to be off Brilinta for 7 days. He has also requested psychiatric treatment program for IV drug use to be established before considering cardiac surgery intervention.  CVA: The patient has had a small infarct of left MCA territory. TPA was deferred as concern for septic emboli. The patient underwent a leftcommon carotid arteriogram followed by complete revascularization of occluded M3 region of inferior division ofleft MCA . She is also s/p placement of a 4.5 mm x 40m pipeline flow diverter at site of focal dissection associated with a small pseudoaneurysm of distal cervical LT ICA. She has been continued on ASA and Brillinta. A1c is 5.9, VLDL was 52. She is on lipitor 80 mg daily.  Hypokalemia: Supplement and monitor.  Dyslipidemia: With low HDL less than 10, high VLDL at 52, high triglyceride at 258. She is on Lipitor.  Sinus tachycardia/ Tachypnea: Drug withdrawal vs deconditioning. The patient has been started on as needed ativan.TSH 0.8. She is also receiving scheduled low-dose Lopressor with holding parameters. Cardiology has advised caution with any increase in dose of beta blocker due to the patient's severe AR.  Right common femoral vein DVT: Not a candidate for anticoagulation due to large right groin hematoma post procedure and endocarditis. She has undergone placement of IVC filter on March 26 by IR.  Large right groin hematoma post procedure: Pseudoaneurysm addressed with S/P 68F angioseal for hemostasisat therightCFA access site. CBC stable with hemoglobin of 9.9.  Blood loss anemia, likely from right groin hematoma. Status post PRBC transfusion x2 on March 27. GI Dr GPenelope Coopconsulted on March 27 who do not think patient has any signs of GI bleed. Monitor CBC. Currently stable at  9.9.  B12 deficiency: Supplement.  Hyponatremia: Present on admission. Now resolved. Monitor.  Hepatitis C:  Antibody positive, viral load negative. HIV screening negative RPR screening negative  Polysubstance abuse: UDS on presentation positive for THC amphetamine. Suspect IV drug use with heroin, multiple needle marks bilateral upper extremities.  Patient state IV drug use ongoing for many years now she is waiting to quit. Positive for benzodiazepines on 04/12/2019.  Anxiety versus drug withdrawal:  Positive for benzodiazepines on admission. She has an episode of crying, acting erratic on [place] 4 a.m. Appears to be responding to as needed Ativan. UDS positive for benzodiazepines on 04/12/2019. I have also started scheduled Buspar.   4:47 PM on 04/12/2019 RN report patient become more confused with increased slurred speech Stat CT head demonstrated evolving areas of recent infarction better seen on prior MRI. Petechial hemorrhage is not visible. There is no discrete hematoma. No acute interval changes.  Tachycardia: Due to deconditioning, vs response to acute illness vs CMO due to polysubstance abuse  Debility: Per PT she needs assistant for bed mobility and transfers. She needs supervision by one person hand-held for ambulation and gait. She was able to ambulate 80 feet on [place] 2nd. Gait Pattern/deviations: Step-through pattern;Decreased stride length;Wide base of support;Trunk flexed. General Gait Details: Mild unsteadiness, pt reaching for furniture required HHA to improve balance. HR 131 bpm at it's highest.   The patient has been seen and examined by me. I have spent 34 minutes in her evaluation and care.  DVT Prophylaxis:scd's Code Status: full Family Communication: none present Disposition Plan: patient is from home. Discharge SNF vs home. Barriers to discharge. The patient will need to complete her 6 weeks of IV Vancomycin and Rocephin. Her last dose will be on 05/17/2019. She has  infective endocarditis and septic emboli due to IV drug abuse. She also has no insurance although she may be able to get Medicaid.  Tearia Gibbs, DO [facility] Direct contact: see [email or web address]  7PM-7AM contact night coverage as above 04/16/2019, 1:40 PM  LOS: 12 days

## 2019-04-16 NOTE — Plan of Care (Signed)
  Problem: Education: Goal: Knowledge of disease or condition will improve Outcome: Progressing Goal: Knowledge of secondary prevention will improve Outcome: Progressing Goal: Knowledge of patient specific risk factors addressed and post discharge goals established will improve Outcome: Progressing   Problem: Coping: Goal: Will verbalize positive feelings about self Outcome: Progressing Goal: Will identify appropriate support needs Outcome: Progressing   Problem: Health Behavior/Discharge Planning: Goal: Ability to manage health-related needs will improve Outcome: Progressing   Problem: Self-Care: Goal: Ability to participate in self-care as condition permits will improve Outcome: Progressing Goal: Verbalization of feelings and concerns over difficulty with self-care will improve Outcome: Progressing Goal: Ability to communicate needs accurately will improve Outcome: Progressing   Problem: Nutrition: Goal: Risk of aspiration will decrease Outcome: Progressing   Problem: Ischemic Stroke/TIA Tissue Perfusion: Goal: Complications of ischemic stroke/TIA will be minimized Outcome: Progressing  Stroke information re-discussed.

## 2019-04-17 LAB — BASIC METABOLIC PANEL
Anion gap: 9 (ref 5–15)
BUN: 6 mg/dL (ref 6–20)
CO2: 21 mmol/L — ABNORMAL LOW (ref 22–32)
Calcium: 8.4 mg/dL — ABNORMAL LOW (ref 8.9–10.3)
Chloride: 109 mmol/L (ref 98–111)
Creatinine, Ser: 0.65 mg/dL (ref 0.44–1.00)
GFR calc Af Amer: >60 mL/min (ref >60)
GFR calc non Af Amer: >60 mL/min (ref >60)
Glucose, Bld: 137 mg/dL — ABNORMAL HIGH (ref 70–99)
Potassium: 3.4 mmol/L — ABNORMAL LOW (ref 3.5–5.1)
Sodium: 139 mmol/L (ref 135–145)

## 2019-04-17 LAB — CBC
HCT: 31.9 % — ABNORMAL LOW (ref 36.0–46.0)
Hemoglobin: 10.1 g/dL — ABNORMAL LOW (ref 12.0–15.0)
MCH: 27.8 pg (ref 26.0–34.0)
MCHC: 31.7 g/dL (ref 30.0–36.0)
MCV: 87.9 fL (ref 80.0–100.0)
Platelets: 529 10*3/uL — ABNORMAL HIGH (ref 150–400)
RBC: 3.63 MIL/uL — ABNORMAL LOW (ref 3.87–5.11)
RDW: 20.2 % — ABNORMAL HIGH (ref 11.5–15.5)
WBC: 6.4 10*3/uL (ref 4.0–10.5)
nRBC: 0 % (ref 0.0–0.2)

## 2019-04-17 MED ORDER — POTASSIUM CHLORIDE CRYS ER 20 MEQ PO TBCR
40.0000 meq | EXTENDED_RELEASE_TABLET | Freq: Once | ORAL | Status: AC
  Administered 2019-04-17: 40 meq via ORAL
  Filled 2019-04-17: qty 2

## 2019-04-17 NOTE — Progress Notes (Signed)
PROGRESS NOTE  Lauren Reyes YJE:[id number] DOB: Jun 02, 1990 DOA: 04/01/2019 PCP: Patient, No Pcp Per  Brief History   Lauren Reyes a 29 y.o.femalewith unknown past medical history, possible heroin abuse presents to the emergency department as a code stroke with aphasia and right-sided weakness.  Lauren Reyes called to the hotel room where she was staying as she had overstated her visit,patient was  Found by Reyes talking on the phone and suddenly started to slur her speech, the right facial droop and had difficulty talking. When EMS arrived patient still communicating but slowly progressively got worse.   Patient was admitted to neurology ICU intubated after  Thrombectomy She underwent thrombectomy for Left MCA m3 occlusion with recanalization complicated by dissection/pseudoanerusym of left ICA s/p stent. She was extubated on March 27.   This morning nursing communicated that the patient had increased heart rate and respiratory rate. This has been an issue for many days. EKG demonstrated only sinus tachycardia. This is assumed to be due to deconditioning or withdrawal, probably from benzodiazepines for which the patient was positive on admission. Ativan has been made available on as needed basis and she has been prescribed scheduled Buspar. This seems to have helped her anxiety.  CTS has been consulted and has stated that the patient is not currently a candidate for surgery. However, they do state that surgery must be considered if the patient deteriorates acutely. Infectious disease is consulted. They have recommended continuing Vancomycin and ceftriaxone for a total of 6 weeks. The last dose is to be given on 05/17/2019. IV antibiotics must be given in a supervised setting due to history of polysubstance abuse and IVDU.  Consultants  . PCCM . Gastroenterology . Cardiothoracic Surgery . Infectious disease.  Procedures  . TEE  Antibiotics   Anti-infectives (From  admission, onward)   Start     Dose/Rate Route Frequency Ordered Stop   04/15/19 0900  vancomycin (VANCOREADY) IVPB 750 mg/150 mL     750 mg 150 mL/hr over 60 Minutes Intravenous Every 8 hours 04/15/19 0821     04/13/19 1400  vancomycin (VANCOCIN) IVPB 1000 mg/200 mL premix  Status:  Discontinued     1,000 mg 200 mL/hr over 60 Minutes Intravenous Every 8 hours 04/13/19 0839 04/15/19 0821   04/12/19 2200  vancomycin (VANCOREADY) IVPB 750 mg/150 mL  Status:  Discontinued     750 mg 150 mL/hr over 60 Minutes Intravenous Every 8 hours 04/12/19 1500 04/13/19 0839   04/10/19 1336  vancomycin (VANCOCIN) IVPB 1000 mg/200 mL premix  Status:  Discontinued     1,000 mg 200 mL/hr over 60 Minutes Intravenous Every 8 hours 04/10/19 1320 04/12/19 1500   04/08/19 1200  cefTRIAXone (ROCEPHIN) 2 g in sodium chloride 0.9 % 100 mL IVPB     2 g 200 mL/hr over 30 Minutes Intravenous Every 12 hours 04/08/19 1013 05/17/19 2359   04/08/19 0530  vancomycin (VANCOCIN) IVPB 1000 mg/200 mL premix  Status:  Discontinued     1,000 mg 200 mL/hr over 60 Minutes Intravenous Every 12 hours 04/07/19 1541 04/10/19 1320   04/07/19 1900  Ampicillin-Sulbactam (UNASYN) 3 g in sodium chloride 0.9 % 100 mL IVPB  Status:  Discontinued     3 g 200 mL/hr over 30 Minutes Intravenous Every 6 hours 04/07/19 1457 04/08/19 1013   04/07/19 1730  vancomycin (VANCOREADY) IVPB 1500 mg/300 mL     1,500 mg 150 mL/hr over 120 Minutes Intravenous  Once 04/07/19 1541 04/07/19 1937  04/04/19 1400  ceFAZolin (ANCEF) IVPB 2g/100 mL premix  Status:  Discontinued     2 g 200 mL/hr over 30 Minutes Intravenous Every 8 hours 04/04/19 1119 04/07/19 1457   04/04/19 0400  Vancomycin (VANCOCIN) 1,250 mg in sodium chloride 0.9 % 250 mL IVPB  Status:  Discontinued     1,250 mg 166.7 mL/hr over 90 Minutes Intravenous Every 12 hours 04/03/19 1635 04/04/19 1159   04/02/19 1800  ceFEPIme (MAXIPIME) 2 g in sodium chloride 0.9 % 100 mL IVPB  Status:   Discontinued     2 g 200 mL/hr over 30 Minutes Intravenous Every 8 hours 04/02/19 1218 04/04/19 1127   04/02/19 1600  vancomycin (VANCOCIN) IVPB 1000 mg/200 mL premix  Status:  Discontinued     1,000 mg 200 mL/hr over 60 Minutes Intravenous Every 12 hours 04/02/19 0100 04/03/19 1635   04/02/19 0200  piperacillin-tazobactam (ZOSYN) IVPB 3.375 g  Status:  Discontinued     3.375 g 12.5 mL/hr over 240 Minutes Intravenous Every 8 hours 04/02/19 0059 04/02/19 1219   04/02/19 0200  vancomycin (VANCOCIN) 1,500 mg in sodium chloride 0.9 % 500 mL IVPB     1,500 mg 250 mL/hr over 120 Minutes Intravenous  Once 04/02/19 0100 04/02/19 0923   04/01/19 2040  ceFAZolin (ANCEF) 2-4 GM/100ML-% IVPB    Note to Pharmacy: Lauren Reyes   : cabinet override      04/01/19 2040 04/02/19 0844      Subjective  The patient is resting comfortably. No new complaints.  Objective   Vitals:  Vitals:   04/17/19 0759 04/17/19 1230  BP: (!) 117/59 (!) 113/54  Pulse: (!) 110 (!) 103  Resp: 18 18  Temp: 98 F (36.7 C) 98.1 F (36.7 C)  SpO2: 99% 100%   Exam:  Constitutional:  . The patient is somnolent, easily awakened. No acute distress. Respiratory:  . No increased work of breathing. . No wheezes, rales, or rhonchi . No tactile fremitus Cardiovascular:  . Regular rate and rhythm . No murmurs, ectopy, or gallups. . No lateral PMI. No thrills. Abdomen:  . Abdomen is soft, non-tender, non-distended . No hernias, masses, or organomegaly . Normoactive bowel sounds.  Musculoskeletal:  . No cyanosis, clubbing, or edema Skin:  . No rashes, lesions, ulcers . palpation of skin: no induration or nodules Neurologic:  . No neurological deficits on exam. Psychiatric:  Pt appears more calm.   I have personally reviewed the following:   Today's Data  . Vitals, BMP, CBC  Micro Data  .  Culture Tracheal aspirate - MSSA  Imaging  . CT head . CT abdomen and pelvis  Cardiology Data  . EKG   Scheduled Meds: . aspirin  81 mg Oral Daily   Or  . aspirin  81 mg Per Tube Daily  . atorvastatin  80 mg Per Tube q1800  . busPIRone  7.5 mg Oral BID  . chlorhexidine gluconate (MEDLINE KIT)  15 mL Mouth Rinse BID  . Chlorhexidine Gluconate Cloth  6 each Topical Daily  . escitalopram  10 mg Oral Daily  . feeding supplement  1 Container Oral TID BM  . metoprolol tartrate  12.5 mg Oral BID  . potassium chloride  40 mEq Oral Once  . QUEtiapine  25 mg Oral QHS  . sodium chloride flush  10-40 mL Intracatheter Q12H  . ticagrelor  90 mg Oral BID  . vitamin B-12  1,000 mcg Per Tube Daily   Or  . vitamin  B-12  1,000 mcg Oral Daily   Continuous Infusions: . sodium chloride 10 mL/hr at 04/15/19 2255  . cefTRIAXone (ROCEPHIN)  IV 2 g (04/17/19 1208)  . vancomycin 750 mg (04/17/19 1012)    Active Problems:   Acute ischemic left MCA stroke ([place])   Middle cerebral artery embolism, left   Acute respiratory failure ([place])   DVT (deep venous thrombosis) ([place])   Encounter for central line placement   Endotracheal tube present   Cerebrovascular accident (CVA) due to embolism of precerebral artery ([facility])   Infective endocarditis   Normocytic anemia   Polysubstance abuse ([place])   IVDU (intravenous drug user)   Systolic congestive heart failure, NYHA class 1 ([place])   Tachypnea   LOS: 16 days   A & P   Culture negative endocarditis involving multiple heart valves: Tracheal aspirate  has grown out MSSA.  Septic emboli have been seen in left hemisphere of brain, in  lung, spleen with possible splenic and kidney infarcts. Initially the patient was treated with Vancomycin and Unasyn. She is currently receiving Vancomycin and Rocephin. Her fever has resolved. Per infectious disease the patient will require 6 weeks total of this regimen with her last dose being 05/17/2019. WBC has declined from 29+ to 13.5 today. The patient has been evaluated by thoracic surgery, Dr. Servando Snare: patient eventually will  require aortic valve replacement, however this is complicated by recent stroke and recent stent on aspirin and Brilinta, as well as a recent decline in mental status.  Per Dr. Pia Mau, surgery should be reconsidered should the patient developed worsening of heart failure syndrome and she has to be off Brilinta for 7 days. He has also requested psychiatric treatment program for IV drug use to be established before considering cardiac surgery intervention.  CVA: The patient has had a small infarct of left MCA territory. TPA was deferred as concern for septic emboli. The patient underwent a leftcommon carotid arteriogram followed by complete revascularization of occluded M3 region of inferior division ofleft MCA . She is also s/p placement of a 4.5 mm x 47m pipeline flow diverter at site of focal dissection associated with a small pseudoaneurysm of distal cervical LT ICA. She has been continued on ASA and Brillinta. A1c is 5.9, VLDL was 52. She is on lipitor 80 mg daily.  Hypokalemia: Supplement and monitor.  Dyslipidemia: With low HDL less than 10, high VLDL at 52, high triglyceride at 258. She is on Lipitor.  Sinus tachycardia/ Tachypnea: Drug withdrawal vs deconditioning. The patient has been started on as needed ativan.TSH 0.8. She is also receiving scheduled low-dose Lopressor with holding parameters. Cardiology has advised caution with any increase in dose of beta blocker due to the patient's severe AR.  Right common femoral vein DVT: Not a candidate for anticoagulation due to large right groin hematoma post procedure and endocarditis. She has undergone placement of IVC filter on March 26 by IR.  Large right groin hematoma post procedure: Pseudoaneurysm addressed with S/P 37F angioseal for hemostasisat therightCFA access site. CBC stable with hemoglobin of 9.9.  Blood loss anemia, likely from right groin hematoma. Status post PRBC transfusion x2 on March 27. GI Dr GPenelope Coopconsulted on March  27 who do not think patient has any signs of GI bleed. Monitor CBC. Currently stable at 9.9.  B12 deficiency: Supplement.  Hyponatremia: Present on admission. Now resolved. Monitor.  Hepatitis C:  Antibody positive, viral load negative. HIV screening negative RPR screening negative  Polysubstance abuse: UDS on presentation  positive for THC amphetamine. Suspect IV drug use with heroin, multiple needle marks bilateral upper extremities. Patient state IV drug use ongoing for many years now she is waiting to quit. Positive for benzodiazepines on 04/12/2019. Pt must complete her IV antibiotics in a supervised setting for this reason.  Anxiety versus drug withdrawal:  Positive for benzodiazepines on admission. She has an episode of crying, acting erratic on [place] 4 a.m. Appears to be responding to as needed Ativan. UDS positive for benzodiazepines on 04/12/2019. I have also started scheduled Buspar.   4:47 PM on 04/12/2019 RN report patient become more confused with increased slurred speech Stat CT head demonstrated evolving areas of recent infarction better seen on prior MRI. Petechial hemorrhage is not visible. There is no discrete hematoma. No acute interval changes.   Tachycardia: Due to deconditioning, vs response to acute illness vs CMO due to polysubstance abuse  Debility: Per PT she needs assistant for bed mobility and transfers. She needs supervision by one person hand-held for ambulation and gait. She was able to ambulate 80 feet on [place] 2nd. Gait Pattern/deviations: Step-through pattern;Decreased stride length;Wide base of support;Trunk flexed. General Gait Details: Mild unsteadiness, pt reaching for furniture required HHA to improve balance. HR 110 bpm at it's highest.   The patient has been seen and examined by me. I have spent 30 minutes in her evaluation and care.  DVT Prophylaxis:scd's Code Status: full Family Communication: none present Disposition Plan: patient is from home.  Discharge SNF vs home. Barriers to discharge. The patient will need to complete her 6 weeks of IV Vancomycin and Rocephin as inpatient for safety reasons due to history of IVDU. Her last dose will be on 05/17/2019. She has infective endocarditis and septic emboli due to IV drug abuse. She also has no insurance although she may be able to get Medicaid.   Alessandro Griep, DO [facility] Direct contact: see [email or web address]  7PM-7AM contact night coverage as above 04/17/2019, 1:45 PM  LOS: 12 days

## 2019-04-17 NOTE — Progress Notes (Addendum)
      [address]       Lauren Reyes [address]             [phone]      10 Days Post-Op Procedure(s) (LRB): TRANSESOPHAGEAL ECHOCARDIOGRAM (TEE) (N/A) BUBBLE STUDY Subjective She appears calm today and feels like she is making good progress with her recovery.   Objective: Vital signs in last 24 hours: Temp:  [97.7 F (36.5 C)-98.6 F (37 C)] 98 F (36.7 C) (04/09 0759) Pulse Rate:  [108-113] 110 (04/09 0759) Cardiac Rhythm: Sinus tachycardia (04/09 0700) Resp:  [17-20] 18 (04/09 0759) BP: (104-119)/(40-59) 117/59 (04/09 0759) SpO2:  [97 %-100 %] 99 % (04/09 0759)     Intake/Output from previous day: 04/08 0701 - 04/09 0700 In: 1330 [P.O.:1080; IV Piggyback:250] Out: 400 [Urine:400] Intake/Output this shift: No intake/output data recorded.  General appearance: alert, cooperative and no distress Heart: sinus tachycardia Lungs: clear to auscultation bilaterally Abdomen: soft, non-tender; bowel sounds normal; no masses,  no organomegaly Extremities: extremities normal, atraumatic, no cyanosis or edema Wound: n/a  Lab Results: Recent Labs    04/15/19 0843  WBC 11.1*  HGB 9.9*  HCT 32.0*  PLT 607*   BMET:  Recent Labs    04/15/19 0843  NA 139  K 3.4*  CL 107  CO2 23  GLUCOSE 109*  BUN 5*  CREATININE 0.64  CALCIUM 8.3*    PT/INR: No results for input(s): LABPROT, INR in the last 72 hours. ABG    Component Value Date/Time   PHART 7.347 (L) 04/02/2019 0032   HCO3 30.5 (H) 04/02/2019 0032   TCO2 32 04/02/2019 0032   O2SAT 100.0 04/02/2019 0032   CBG (last 3)  No results for input(s): GLUCAP in the last 72 hours.  Assessment/Plan: S/P Procedure(s) (LRB): TRANSESOPHAGEAL ECHOCARDIOGRAM (TEE) (N/A) BUBBLE STUDY  1. CV-BP well controlled. Remains in sinus tachycardia. Appreciate cardiology following along.  2. Pulm-tolerating room air with good oxygen saturation 3. Renal-creatinine 0.64, no new labs 4. H and H stable 5. Neuro-Hx  of ischemic left MCA stroke, working with SLP, continue Brillinta 6. Polysubstance abuse, detoxing while in the hospital.  7. IV Vancomycin and Rocephin for 6 weeks. Last dose 05/17/2019 8. Hx of panic attacks and anxiety. On Buspar and Lexapro. Ativan PRN.   Plan: Great conversation with Lauren Reyes this morning. She enjoyed spending time with the chaplain yesterday. She had a good visit with her boyfriend and feels supported through this process. She continues to work with SLP and feels her speech is slowly improving. Will order labs for the morning since she has not had any for several days.    LOS: 16 days    [name] 04/17/2019  Patient mental status has improved I have seen and examined Lauren Reyes and agree with the above assessment  and plan.  Delight Ovens MD Bee[facility] [phone] Office [phone] 04/17/2019 2:06 PM

## 2019-04-17 NOTE — Progress Notes (Signed)
Physical Therapy Treatment Patient Details Name: Lauren Reyes MRN: [id number] DOB: [birth year] Today's Date: 04/17/2019    History of Present Illness 29 year old female with suspected IV drug abuse who was being interviewed by police tonight and developed right-sided facial weakness, aphasia, and R weakness.  She was brought by EMS to the emergency room work-up revealed occluded left MCA. Revascularization performed by IR. CT head and neck demonstrate multiple small cavitary abscesses in the upper lobes bilaterally. IVC filter placement for RLE DVT on 3/26.    PT Comments    Patient is making progress toward PT goals and tolerated increased mobility without use of AD. Pt continues to present with balance deficits and will benefit from further skilled PT services to maximize independence and safety with mobility.    Follow Up Recommendations  Home health PT     Equipment Recommendations  None recommended by PT    Recommendations for Other Services       Precautions / Restrictions Precautions Precautions: Fall Restrictions Weight Bearing Restrictions: No    Mobility  Bed Mobility Overal bed mobility: Independent                Transfers Overall transfer level: Modified independent   Transfers: Sit to/from Stand              Ambulation/Gait Ambulation/Gait assistance: Supervision;Min guard Gait Distance (Feet): 400 Feet Assistive device: None Gait Pattern/deviations: Decreased stride length;Narrow base of support;Step-through pattern;Drifts right/left Gait velocity: decreased   General Gait Details: pt with mild gait instability and drifting R and L; no LOB    Stairs             Wheelchair Mobility    Modified Rankin (Stroke Patients Only) Modified Rankin (Stroke Patients Only) Pre-Morbid Rankin Score: No symptoms Modified Rankin: Moderate disability     Balance Overall balance assessment: Needs assistance Sitting-balance support:  Feet supported Sitting balance-Leahy Scale: Normal       Standing balance-Leahy Scale: Fair               High level balance activites: Head turns;Sudden stops;Other (comment)(increased gait speed) High Level Balance Comments: increased gait deviations with horizontal head turns but no LOB            Cognition Arousal/Alertness: Awake/alert Behavior During Therapy: WFL for tasks assessed/performed;Flat affect Overall Cognitive Status: Impaired/Different from baseline Area of Impairment: Safety/judgement;Problem solving                         Safety/Judgement: Decreased awareness of safety;Decreased awareness of deficits   Problem Solving: Requires verbal cues;Difficulty sequencing        Exercises      General Comments        Pertinent Vitals/Pain Pain Assessment: No/denies pain    Home Living                      Prior Function            PT Goals (current goals can now be found in the care plan section) Progress towards PT goals: Progressing toward goals    Frequency    Min 4X/week      PT Plan Current plan remains appropriate    Co-evaluation              AM-PAC PT "6 Clicks" Mobility   Outcome Measure  Help needed turning from your back to your side while in a flat bed without  using bedrails?: None Help needed moving from lying on your back to sitting on the side of a flat bed without using bedrails?: None Help needed moving to and from a bed to a chair (including a wheelchair)?: None Help needed standing up from a chair using your arms (e.g., wheelchair or bedside chair)?: None Help needed to walk in hospital room?: None Help needed climbing 3-5 steps with a railing? : A Little 6 Click Score: 23    End of Session Equipment Utilized During Treatment: Gait belt Activity Tolerance: Patient tolerated treatment well Patient left: in bed;with call bell/phone within reach;with nursing/sitter in room(pt sitting  EOB) Nurse Communication: Mobility status PT Visit Diagnosis: Other abnormalities of gait and mobility (R26.89);Unsteadiness on feet (R26.81);Difficulty in walking, not elsewhere classified (R26.2)     Time: 8546-2703 PT Time Calculation (min) (ACUTE ONLY): 20 min  Charges:  $Gait Training: 8-22 mins                     Erline Levine, PTA Acute Rehabilitation Services Pager: [phone] Office: [phone]     Carolynne Edouard 04/17/2019, 9:31 AM

## 2019-04-18 LAB — COMPREHENSIVE METABOLIC PANEL WITH GFR
ALT: 19 U/L (ref 0–44)
AST: 22 U/L (ref 15–41)
Albumin: 2.2 g/dL — ABNORMAL LOW (ref 3.5–5.0)
Alkaline Phosphatase: 67 U/L (ref 38–126)
Anion gap: 10 (ref 5–15)
BUN: 6 mg/dL (ref 6–20)
CO2: 20 mmol/L — ABNORMAL LOW (ref 22–32)
Calcium: 8 mg/dL — ABNORMAL LOW (ref 8.9–10.3)
Chloride: 108 mmol/L (ref 98–111)
Creatinine, Ser: 0.63 mg/dL (ref 0.44–1.00)
GFR calc Af Amer: >60 mL/min
GFR calc non Af Amer: >60 mL/min
Glucose, Bld: 96 mg/dL (ref 70–99)
Potassium: 3.7 mmol/L (ref 3.5–5.1)
Sodium: 138 mmol/L (ref 135–145)
Total Bilirubin: 0.5 mg/dL (ref 0.3–1.2)
Total Protein: 6 g/dL — ABNORMAL LOW (ref 6.5–8.1)

## 2019-04-18 LAB — CBC
HCT: 31.9 % — ABNORMAL LOW (ref 36.0–46.0)
Hemoglobin: 9.9 g/dL — ABNORMAL LOW (ref 12.0–15.0)
MCH: 27.3 pg (ref 26.0–34.0)
MCHC: 31 g/dL (ref 30.0–36.0)
MCV: 87.9 fL (ref 80.0–100.0)
Platelets: 506 10*3/uL — ABNORMAL HIGH (ref 150–400)
RBC: 3.63 MIL/uL — ABNORMAL LOW (ref 3.87–5.11)
RDW: 20.1 % — ABNORMAL HIGH (ref 11.5–15.5)
WBC: 3.5 10*3/uL — ABNORMAL LOW (ref 4.0–10.5)
nRBC: 0 % (ref 0.0–0.2)

## 2019-04-18 LAB — VANCOMYCIN, TROUGH: Vancomycin Tr: 16 ug/mL (ref 15–20)

## 2019-04-18 NOTE — Progress Notes (Signed)
PROGRESS NOTE  Lauren Reyes:[id number] DOB: Jun 02, 1990 DOA: 04/01/2019 PCP: Patient, No Pcp Per  Brief History   Lauren Reyes a 29 y.o.femalewith unknown past medical history, possible heroin abuse presents to the emergency department as a code stroke with aphasia and right-sided weakness.  [place] PD called to the hotel room where she was staying as she had overstated her visit,patient was  Found by PD talking on the phone and suddenly started to slur her speech, the right facial droop and had difficulty talking. When EMS arrived patient still communicating but slowly progressively got worse.   Patient was admitted to neurology ICU intubated after  Thrombectomy She underwent thrombectomy for Left MCA m3 occlusion with recanalization complicated by dissection/pseudoanerusym of left ICA s/p stent. She was extubated on March 27.   This morning nursing communicated that the patient had increased heart rate and respiratory rate. This has been an issue for many days. EKG demonstrated only sinus tachycardia. This is assumed to be due to deconditioning or withdrawal, probably from benzodiazepines for which the patient was positive on admission. Ativan has been made available on as needed basis and she has been prescribed scheduled Buspar. This seems to have helped her anxiety.  CTS has been consulted and has stated that the patient is not currently a candidate for surgery. However, they do state that surgery must be considered if the patient deteriorates acutely. Infectious disease is consulted. They have recommended continuing Vancomycin and ceftriaxone for a total of 6 weeks. The last dose is to be given on 05/17/2019. IV antibiotics must be given in a supervised setting due to history of polysubstance abuse and IVDU.  Consultants  . PCCM . Gastroenterology . Cardiothoracic Surgery . Infectious disease.  Procedures  . TEE  Antibiotics   Anti-infectives (From  admission, onward)   Start     Dose/Rate Route Frequency Ordered Stop   04/15/19 0900  vancomycin (VANCOREADY) IVPB 750 mg/150 mL     750 mg 150 mL/hr over 60 Minutes Intravenous Every 8 hours 04/15/19 0821     04/13/19 1400  vancomycin (VANCOCIN) IVPB 1000 mg/200 mL premix  Status:  Discontinued     1,000 mg 200 mL/hr over 60 Minutes Intravenous Every 8 hours 04/13/19 0839 04/15/19 0821   04/12/19 2200  vancomycin (VANCOREADY) IVPB 750 mg/150 mL  Status:  Discontinued     750 mg 150 mL/hr over 60 Minutes Intravenous Every 8 hours 04/12/19 1500 04/13/19 0839   04/10/19 1336  vancomycin (VANCOCIN) IVPB 1000 mg/200 mL premix  Status:  Discontinued     1,000 mg 200 mL/hr over 60 Minutes Intravenous Every 8 hours 04/10/19 1320 04/12/19 1500   04/08/19 1200  cefTRIAXone (ROCEPHIN) 2 g in sodium chloride 0.9 % 100 mL IVPB     2 g 200 mL/hr over 30 Minutes Intravenous Every 12 hours 04/08/19 1013 05/17/19 2359   04/08/19 0530  vancomycin (VANCOCIN) IVPB 1000 mg/200 mL premix  Status:  Discontinued     1,000 mg 200 mL/hr over 60 Minutes Intravenous Every 12 hours 04/07/19 1541 04/10/19 1320   04/07/19 1900  Ampicillin-Sulbactam (UNASYN) 3 g in sodium chloride 0.9 % 100 mL IVPB  Status:  Discontinued     3 g 200 mL/hr over 30 Minutes Intravenous Every 6 hours 04/07/19 1457 04/08/19 1013   04/07/19 1730  vancomycin (VANCOREADY) IVPB 1500 mg/300 mL     1,500 mg 150 mL/hr over 120 Minutes Intravenous  Once 04/07/19 1541 04/07/19 1937  04/04/19 1400  ceFAZolin (ANCEF) IVPB 2g/100 mL premix  Status:  Discontinued     2 g 200 mL/hr over 30 Minutes Intravenous Every 8 hours 04/04/19 1119 04/07/19 1457   04/04/19 0400  Vancomycin (VANCOCIN) 1,250 mg in sodium chloride 0.9 % 250 mL IVPB  Status:  Discontinued     1,250 mg 166.7 mL/hr over 90 Minutes Intravenous Every 12 hours 04/03/19 1635 04/04/19 1159   04/02/19 1800  ceFEPIme (MAXIPIME) 2 g in sodium chloride 0.9 % 100 mL IVPB  Status:   Discontinued     2 g 200 mL/hr over 30 Minutes Intravenous Every 8 hours 04/02/19 1218 04/04/19 1127   04/02/19 1600  vancomycin (VANCOCIN) IVPB 1000 mg/200 mL premix  Status:  Discontinued     1,000 mg 200 mL/hr over 60 Minutes Intravenous Every 12 hours 04/02/19 0100 04/03/19 1635   04/02/19 0200  piperacillin-tazobactam (ZOSYN) IVPB 3.375 g  Status:  Discontinued     3.375 g 12.5 mL/hr over 240 Minutes Intravenous Every 8 hours 04/02/19 0059 04/02/19 1219   04/02/19 0200  vancomycin (VANCOCIN) 1,500 mg in sodium chloride 0.9 % 500 mL IVPB     1,500 mg 250 mL/hr over 120 Minutes Intravenous  Once 04/02/19 0100 04/02/19 0923   04/01/19 2040  ceFAZolin (ANCEF) 2-4 GM/100ML-% IVPB    Note to Pharmacy: Lauren Reyes   : cabinet override      04/01/19 2040 04/02/19 0844      Subjective  The patient is resting comfortably. No new complaints.  Objective   Vitals:  Vitals:   04/18/19 0843 04/18/19 1123  BP: (!) 116/56 (!) 110/54  Pulse: (!) 108 (!) 107  Resp: 18 18  Temp: 98 F (36.7 C) 97.7 F (36.5 C)  SpO2: 100% 100%   Exam:  Constitutional:  . The patient is somnolent, easily awakened. No acute distress. Respiratory:  . No increased work of breathing. . No wheezes, rales, or rhonchi . No tactile fremitus Cardiovascular:  . Regular rate and rhythm . No murmurs, ectopy, or gallups. . No lateral PMI. No thrills. Abdomen:  . Abdomen is soft, non-tender, non-distended . No hernias, masses, or organomegaly . Normoactive bowel sounds.  Musculoskeletal:  . No cyanosis, clubbing, or edema Skin:  . No rashes, lesions, ulcers . palpation of skin: no induration or nodules Neurologic:  . No neurological deficits on exam. Psychiatric:  Pt appears more calm.   I have personally reviewed the following:   Today's Data  . Vitals, BMP, CBC  Micro Data  .  Culture Tracheal aspirate - MSSA  Imaging  . CT head . CT abdomen and pelvis  Cardiology Data  . EKG   Scheduled Meds: . aspirin  81 mg Oral Daily  . atorvastatin  80 mg Per Tube q1800  . busPIRone  7.5 mg Oral BID  . chlorhexidine gluconate (MEDLINE KIT)  15 mL Mouth Rinse BID  . Chlorhexidine Gluconate Cloth  6 each Topical Daily  . escitalopram  10 mg Oral Daily  . feeding supplement  1 Container Oral TID BM  . metoprolol tartrate  12.5 mg Oral BID  . QUEtiapine  25 mg Oral QHS  . sodium chloride flush  10-40 mL Intracatheter Q12H  . ticagrelor  90 mg Oral BID  . vitamin B-12  1,000 mcg Oral Daily   Continuous Infusions: . sodium chloride 10 mL/hr at 04/15/19 2255  . cefTRIAXone (ROCEPHIN)  IV 2 g (04/18/19 1029)  . 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  Septic emboli have been seen in left hemisphere of brain, in  lung, spleen with possible splenic and kidney infarcts. Initially the patient was treated with Vancomycin and Unasyn. She is currently receiving Vancomycin and Rocephin. Her fever has resolved. Per infectious disease the patient will require 6 weeks total of this regimen with her last dose being 05/17/2019. WBC has declined from 29+ to 13.5 today. The patient has been evaluated by thoracic surgery, Dr. Servando Snare: patient eventually will require aortic valve replacement, however this is complicated by recent stroke and recent stent on aspirin and Brilinta, as well as a  recent decline in mental status.  Per Dr. Pia Mau, surgery should be reconsidered should the patient developed worsening of heart failure syndrome and she has to be off Brilinta for 7 days. He has also requested psychiatric treatment program for IV drug use to be established before considering cardiac surgery intervention.  CVA: The patient has had a small infarct of left MCA territory. TPA was deferred as concern for septic emboli. The patient underwent a leftcommon carotid arteriogram followed by complete revascularization of occluded M3 region of inferior division ofleft MCA . She is also s/p placement of a 4.5 mm x 58m pipeline flow diverter at site of focal dissection associated with a small pseudoaneurysm of distal cervical LT ICA. She has been continued on ASA and Brillinta. A1c is 5.9, VLDL was 52. She is on lipitor 80 mg daily.  Hypokalemia: Resolved. Monitor.  Dyslipidemia: With low HDL less than 10, high VLDL at 52, high triglyceride at 258. She is on Lipitor.  Sinus tachycardia/ Tachypnea: Drug withdrawal vs deconditioning. The patient has been started on as needed ativan.TSH 0.8. She is also receiving scheduled low-dose Lopressor with holding parameters. Cardiology has advised caution with any increase in dose of beta blocker due to the patient's severe AR.  Right common femoral vein DVT: Not a candidate for anticoagulation due to large right groin hematoma post procedure and endocarditis. She has undergone placement of IVC filter on March 26 by IR.  Large right groin hematoma post procedure: Pseudoaneurysm addressed with S/P 32F angioseal for hemostasisat therightCFA access site. CBC stable with hemoglobin of 9.9.  Blood loss anemia, likely from right groin hematoma. Status post PRBC transfusion x2 on March 27. GI Dr GPenelope Coopconsulted on March 27 who do not think patient has any signs of GI bleed. Monitor CBC. Currently stable at 9.9.  B12 deficiency: Supplement.   Hyponatremia: Present on admission. Now resolved. Monitor.  Hepatitis C:  Antibody positive, viral load negative. HIV screening negative RPR screening negative  Polysubstance abuse: UDS on presentation positive for THC amphetamine. Suspect IV drug use with heroin, multiple needle marks bilateral upper extremities. Patient state IV drug use ongoing for many years now she is waiting to quit. Positive for benzodiazepines on  04/12/2019. Pt must complete her IV antibiotics in a supervised setting for this reason.  Anxiety versus drug withdrawal:  Positive for benzodiazepines on admission. She has an episode of crying, acting erratic on Lauren Reyes 4 a.m. Appears to be responding to as needed Ativan. UDS positive for benzodiazepines on 04/12/2019. I have also started scheduled Buspar.   4:47 PM on 04/12/2019 RN report patient become more confused with increased slurred speech Stat CT head demonstrated evolving areas of recent infarction better seen on prior MRI. Petechial hemorrhage is not visible. There is no discrete hematoma. No acute interval changes.   Tachycardia: Due to deconditioning, vs response to acute illness vs CMO due to polysubstance abuse  Debility: Per PT she needs assistant for bed mobility and transfers. She needs supervision by one person hand-held for ambulation and gait. She was able to ambulate 80 feet on [place] 2nd. Gait Pattern/deviations: Step-through pattern;Decreased stride length;Wide base of support;Trunk flexed. General Gait Details: Mild unsteadiness, pt reaching for furniture required HHA to improve balance. HR 110 bpm at it's highest.   The patient has been seen and examined by me. I have spent 28 minutes in her evaluation and care.  DVT Prophylaxis:scd's Code Status: full Family Communication: none present Disposition Plan: patient is from home. Discharge SNF vs home. Barriers to discharge. The patient will need to complete her 6 weeks of IV Vancomycin and Rocephin as  inpatient for safety reasons due to history of IVDU. Her last dose will be on 05/17/2019. She has infective endocarditis and septic emboli due to IV drug abuse. She also has no insurance although she may be able to get Medicaid.   Lauren Diviney, DO [facility] Direct contact: see [email or web address]  7PM-7AM contact night coverage as above 04/17/2019, 2:59 PM  LOS: 12 days

## 2019-04-18 NOTE — Progress Notes (Signed)
Pharmacy Antibiotic Note  Lauren Reyes is a 29 y.o. female admitted on 04/01/2019 as a code stroke with aphasia and right-sided weakness. Pharmacy has been consulted for vancomycin dosing.  TEE on 3/30 revealed severe endocarditis involving aortic, mitral, and tricuspid valves with severe aortic regurgitation. Patient's condition is further complicated by septic emboli to her brain, lungs, and spleen. Respiratory culture grew MSSA, however, recent blood cultures are negative. Appears to have received at least one dose of cefazolin prior to culture draw, but will plan to treat with vancomycin and ceftriaxone to cover MSSA and other causes of culture-negative endocarditis.  Lauren Reyes is afebrile with WBC 3.5. SCr is stable at 0.63. A vancomycin trough this morning was measured at 16 mcg/ml however was drawn ~6 hours after the AM dose. It can be presumed that this patient's true trough level is closer to 15 mcg/ml given correction for the early draw and schedule of the doses in the past 24 hours. Will not adjust for now but will continue at the current dosing.   Noted that doses are being adjusted on troughs alone due to concern of cerebritis.   Plan: - Continue Vancomycin at 750 mg IV q 8 hours  - Consider weekly checks while renal function remains stable - Continue ceftriaxone IV 2g q12 per ID - Monitor renal function, clinical improvement, WBC, and temperature - End date for antibiotics: 05/17/19  Height: 5\' 5"  (165.1 cm) Weight: 72.3 kg (159 lb 6.3 oz) IBW/kg (Calculated) : 57  Temp (24hrs), Avg:98.4 F (36.9 C), Min:98 F (36.7 C), Max:98.9 F (37.2 C)  Recent Labs  Lab 04/12/19 0423 04/12/19 0423 04/12/19 0740 04/12/19 1335 04/13/19 0838 04/14/19 0500 04/15/19 0430 04/15/19 0843 04/17/19 1012 04/18/19 0610  WBC 15.0*   < >  --   --  13.5* 12.1*  --  11.1* 6.4 3.5*  CREATININE 0.55  --   --   --  0.68  --   --  0.64 0.65  --   VANCOTROUGH  --   --   --  14*  --   --   23*  --   --   --   VANCOPEAK  --   --  46*  --   --   --   --   --   --   --    < > = values in this interval not displayed.    Estimated Creatinine Clearance: 102.4 mL/min (by C-G formula based on SCr of 0.65 mg/dL).    No Known Allergies  Antimicrobials this admission: Zosyn 3/25 >> 3/25 Cefepime 3/25 >> 3/27 Ancef 3/27 >> 3/30 Vanc 3/25 >> 3/27; restarted 3/30 >> (5/9) Unasyn 3/30>>3/31 Ceftriaxone 3/31>>(5/9)  Microbiology results: 3/24 COVID/Flu >> neg 3/25 RCx >> moderate MSSA 3/25 MRSA PCR >> negative 3/25 BCx >> negative   Thank you for allowing pharmacy to be a part of this patient's care.  4/25, PharmD, BCPS Clinical Pharmacist Clinical phone for 04/18/2019: 06/18/2019 04/18/2019 12:41 PM   **Pharmacist phone directory can now be found on [email or web address] ([email or web address]).  Listed under [facility].

## 2019-04-18 NOTE — Progress Notes (Signed)
Physical Therapy Treatment Patient Details Name: Lauren Reyes MRN: [id number] DOB: 1990-12-17 Today's Date: 04/18/2019    History of Present Illness 29 year old female with suspected IV drug abuse who was being interviewed by police tonight and developed right-sided facial weakness, aphasia, and R weakness.  She was brought by EMS to the emergency room work-up revealed occluded left MCA. Revascularization performed by IR. CT head and neck demonstrate multiple small cavitary abscesses in the upper lobes bilaterally. IVC filter placement for RLE DVT on 3/26.    PT Comments    Patient continues to ambulate without a device with no LOB. . Therapy will continue to advance her gait and balance activity as tolerated.  She verbalized fatigue after ambulation.   Follow Up Recommendations  Home health PT     Equipment Recommendations  None recommended by PT    Recommendations for Other Services       Precautions / Restrictions Precautions Precautions: Fall Restrictions Weight Bearing Restrictions: No    Mobility  Bed Mobility Overal bed mobility: Independent                Transfers Overall transfer level: Independent                  Ambulation/Gait Ambulation/Gait assistance: Supervision Gait Distance (Feet): 400 Feet         General Gait Details: slight lateral pertabations but no LOB. Fatigue noted at the end of gait training    Stairs             Wheelchair Mobility    Modified Rankin (Stroke Patients Only) Modified Rankin (Stroke Patients Only) Pre-Morbid Rankin Score: No symptoms Modified Rankin: Moderate disability     Balance Overall balance assessment: Needs assistance Sitting-balance support: Feet supported Sitting balance-Leahy Scale: Normal     Standing balance support: No upper extremity supported Standing balance-Leahy Scale: Fair Standing balance comment: small lateral movements but no LOB                              Cognition Arousal/Alertness: Awake/alert Behavior During Therapy: WFL for tasks assessed/performed                                          Exercises      General Comments        Pertinent Vitals/Pain Pain Assessment: No/denies pain    Home Living                      Prior Function            PT Goals (current goals can now be found in the care plan section) Acute Rehab PT Goals Patient Stated Goal: 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 PT Goal Formulation: With family Time For Goal Achievement: 04/18/19 Potential to Achieve Goals: Good Progress towards PT goals: Progressing toward goals    Frequency    Min 4X/week      PT Plan Current plan remains appropriate    Co-evaluation PT/OT/SLP Co-Evaluation/Treatment: Yes            AM-PAC PT "6 Clicks" Mobility   Outcome Measure  Help needed turning from your back to your side while in a flat bed without using bedrails?: None Help needed moving from lying on your back  to sitting on the side of a flat bed without using bedrails?: None Help needed moving to and from a bed to a chair (including a wheelchair)?: None Help needed standing up from a chair using your arms (e.g., wheelchair or bedside chair)?: None Help needed to walk in hospital room?: None Help needed climbing 3-5 steps with a railing? : A Little 6 Click Score: 23    End of Session Equipment Utilized During Treatment: Gait belt Activity Tolerance: Patient tolerated treatment well Patient left: in bed;with call bell/phone within reach;with nursing/sitter in room Nurse Communication: Mobility status PT Visit Diagnosis: Other abnormalities of gait and mobility (R26.89);Unsteadiness on feet (R26.81);Difficulty in walking, not elsewhere classified (R26.2)     Time: 3343-5686 PT Time Calculation (min) (ACUTE ONLY): 10 min  Charges:                          Dessie Coma PT DPT   04/18/2019, 3:59 PM

## 2019-04-19 ENCOUNTER — Inpatient Hospital Stay (HOSPITAL_COMMUNITY): Payer: Medicaid Other

## 2019-04-19 LAB — D-DIMER, QUANTITATIVE: D-Dimer, Quant: 3.55 ug/mL-FEU — ABNORMAL HIGH (ref 0.00–0.50)

## 2019-04-19 MED ORDER — IOHEXOL 350 MG/ML SOLN
80.0000 mL | Freq: Once | INTRAVENOUS | Status: AC | PRN
  Administered 2019-04-19: 17:00:00 54 mL via INTRAVENOUS

## 2019-04-19 MED ORDER — PNEUMOCOCCAL VAC POLYVALENT 25 MCG/0.5ML IJ INJ
0.5000 mL | INJECTION | INTRAMUSCULAR | Status: DC
  Filled 2019-04-19: qty 0.5

## 2019-04-19 NOTE — Progress Notes (Addendum)
PROGRESS NOTE  Lauren Reyes RSW:[id number] DOB: 1990/08/03 DOA: 04/01/2019 PCP: Patient, No Pcp Per  Brief History   Lauren Reyes a 29 y.o.femalewith unknown past medical history, possible heroin abuse presents to the emergency department as a code stroke with aphasia and right-sided weakness.  [place] [employer] called to the hotel room where she was staying as she had overstated her visit,patient was  Found by PD talking on the phone and suddenly started to slur her speech, the right facial droop and had difficulty talking. When EMS arrived patient still communicating but slowly progressively got worse.   Patient was admitted to neurology ICU intubated after  Thrombectomy She underwent thrombectomy for Left MCA m3 occlusion with recanalization complicated by dissection/pseudoanerusym of left ICA s/p stent. She was extubated on March 27.   This morning nursing communicated that the patient had increased heart rate and respiratory rate. This has been an issue for many days. EKG demonstrated only sinus tachycardia. This is assumed to be due to deconditioning or withdrawal, probably from benzodiazepines for which the patient was positive on admission. Ativan has been made available on as needed basis and she has been prescribed scheduled Buspar. This seems to have helped her anxiety.  CTS has been consulted and has stated that the patient is not currently a candidate for surgery. However, they do state that surgery must be considered if the patient deteriorates acutely. Infectious disease is consulted. They have recommended continuing Vancomycin and ceftriaxone for a total of 6 weeks. The last dose is to be given on 05/17/2019. IV antibiotics must be given in a supervised setting due to history of polysubstance abuse and IVDU.  This morning the patient is tachypneic and short of breath. D Dimer has been ordered and was elevated at 3.55. CTA of chest has been ordered to rule out  pulmonary embolus.  Consultants  . PCCM . Gastroenterology . Cardiothoracic Surgery . Infectious disease.  Procedures  . TEE  Antibiotics   Anti-infectives (From admission, onward)   Start     Dose/Rate Route Frequency Ordered Stop   04/15/19 0900  vancomycin (VANCOREADY) IVPB 750 mg/150 mL     750 mg 150 mL/hr over 60 Minutes Intravenous Every 8 hours 04/15/19 0821     04/13/19 1400  vancomycin (VANCOCIN) IVPB 1000 mg/200 mL premix  Status:  Discontinued     1,000 mg 200 mL/hr over 60 Minutes Intravenous Every 8 hours 04/13/19 0839 04/15/19 0821   04/12/19 2200  vancomycin (VANCOREADY) IVPB 750 mg/150 mL  Status:  Discontinued     750 mg 150 mL/hr over 60 Minutes Intravenous Every 8 hours 04/12/19 1500 04/13/19 0839   04/10/19 1336  vancomycin (VANCOCIN) IVPB 1000 mg/200 mL premix  Status:  Discontinued     1,000 mg 200 mL/hr over 60 Minutes Intravenous Every 8 hours 04/10/19 1320 04/12/19 1500   04/08/19 1200  cefTRIAXone (ROCEPHIN) 2 g in sodium chloride 0.9 % 100 mL IVPB     2 g 200 mL/hr over 30 Minutes Intravenous Every 12 hours 04/08/19 1013 05/17/19 2359   04/08/19 0530  vancomycin (VANCOCIN) IVPB 1000 mg/200 mL premix  Status:  Discontinued     1,000 mg 200 mL/hr over 60 Minutes Intravenous Every 12 hours 04/07/19 1541 04/10/19 1320   04/07/19 1900  Ampicillin-Sulbactam (UNASYN) 3 g in sodium chloride 0.9 % 100 mL IVPB  Status:  Discontinued     3 g 200 mL/hr over 30 Minutes Intravenous Every 6 hours 04/07/19 1457 04/08/19  1013   04/07/19 1730  vancomycin (VANCOREADY) IVPB 1500 mg/300 mL     1,500 mg 150 mL/hr over 120 Minutes Intravenous  Once 04/07/19 1541 04/07/19 1937   04/04/19 1400  ceFAZolin (ANCEF) IVPB 2g/100 mL premix  Status:  Discontinued     2 g 200 mL/hr over 30 Minutes Intravenous Every 8 hours 04/04/19 1119 04/07/19 1457   04/04/19 0400  Vancomycin (VANCOCIN) 1,250 mg in sodium chloride 0.9 % 250 mL IVPB  Status:  Discontinued     1,250 mg 166.7  mL/hr over 90 Minutes Intravenous Every 12 hours 04/03/19 1635 04/04/19 1159   04/02/19 1800  ceFEPIme (MAXIPIME) 2 g in sodium chloride 0.9 % 100 mL IVPB  Status:  Discontinued     2 g 200 mL/hr over 30 Minutes Intravenous Every 8 hours 04/02/19 1218 04/04/19 1127   04/02/19 1600  vancomycin (VANCOCIN) IVPB 1000 mg/200 mL premix  Status:  Discontinued     1,000 mg 200 mL/hr over 60 Minutes Intravenous Every 12 hours 04/02/19 0100 04/03/19 1635   04/02/19 0200  piperacillin-tazobactam (ZOSYN) IVPB 3.375 g  Status:  Discontinued     3.375 g 12.5 mL/hr over 240 Minutes Intravenous Every 8 hours 04/02/19 0059 04/02/19 1219   04/02/19 0200  vancomycin (VANCOCIN) 1,500 mg in sodium chloride 0.9 % 500 mL IVPB     1,500 mg 250 mL/hr over 120 Minutes Intravenous  Once 04/02/19 0100 04/02/19 0923   04/01/19 2040  ceFAZolin (ANCEF) 2-4 GM/100ML-% IVPB    Note to Pharmacy: [name]   : cabinet override      04/01/19 2040 04/02/19 0844      Subjective  The patient is complaining of dyspnea. She has been tachypneic. No new complaints.  Objective   Vitals:  Vitals:   04/19/19 0734 04/19/19 0939  BP: (!) 126/46 114/77  Pulse: (!) 106 (!) 106  Resp: (!) 28 (!) 26  Temp: 98.1 F (36.7 C) (!) 97.5 F (36.4 C)  SpO2: 100% 100%   Exam:  Constitutional:  . The patient is somnolent, easily awakened. Mild distress from dyspnea with conversation. Denies chest pain. Respiratory:  . Positive for conversational dyspnea and tachypnea. . No wheezes, rales, or rhonchi . No tactile fremitus Cardiovascular:  . Regular rate and rhythm . No murmurs, ectopy, or gallups. . No lateral PMI. No thrills. Abdomen:  . Abdomen is soft, non-tender, non-distended . No hernias, masses, or organomegaly . Normoactive bowel sounds.  Musculoskeletal:  . No cyanosis, clubbing, or edema Skin:  . No rashes, lesions, ulcers . palpation of skin: no induration or nodules Neurologic:  . No neurological deficits  on exam. Psychiatric:  Pt appears more calm.   I have personally reviewed the following:   Today's Data  . Vitals, BMP, CBC, D Dimer (+)  Micro Data  .  Culture Tracheal aspirate - MSSA  Imaging  . CT head . CT abdomen and pelvis . CXR; no acute pathology . CTA chest: pending  Cardiology Data  . EKG  Scheduled Meds: . aspirin  81 mg Oral Daily  . atorvastatin  80 mg Per Tube q1800  . busPIRone  7.5 mg Oral BID  . chlorhexidine gluconate (MEDLINE KIT)  15 mL Mouth Rinse BID  . Chlorhexidine Gluconate Cloth  6 each Topical Daily  . escitalopram  10 mg Oral Daily  . feeding supplement  1 Container Oral TID BM  . metoprolol tartrate  12.5 mg Oral BID  . QUEtiapine  25  mg Oral QHS  . sodium chloride flush  10-40 mL Intracatheter Q12H  . ticagrelor  90 mg Oral BID  . vitamin B-12  1,000 mcg Oral Daily   Continuous Infusions: . sodium chloride 10 mL/hr at 04/15/19 2255  . cefTRIAXone (ROCEPHIN)  IV 2 g (04/19/19 1017)  . vancomycin 750 mg (04/19/19 0904)    Active Problems:   Acute ischemic left MCA stroke ([place])   Middle cerebral artery embolism, left   Acute respiratory failure ([place])   DVT (deep venous thrombosis) ([place])   Encounter for central line placement   Endotracheal tube present   Cerebrovascular accident (CVA) due to embolism of precerebral artery (HCC)   Infective endocarditis   Normocytic anemia   Polysubstance abuse ([place])   IVDU (intravenous drug user)   Systolic congestive heart failure, NYHA class 1 ([place])   Tachypnea   LOS: 18 days   A & P   Culture negative endocarditis involving multiple heart valves: Tracheal aspirate  has grown out MSSA.  Septic emboli have been seen in left hemisphere of brain, in  lung, spleen with possible splenic and kidney infarcts. Initially the patient was treated with Vancomycin and Unasyn. She is currently receiving Vancomycin and Rocephin. Her fever has resolved. Per infectious disease the patient will require 6 weeks  total of this regimen with her last dose being 05/17/2019. WBC has declined from 29+ to 13.5 today. The patient has been evaluated by thoracic surgery, Dr. Servando Snare: patient eventually will require aortic valve replacement, however this is complicated by recent stroke and recent stent on aspirin and Brilinta, as well as a recent decline in mental status.  Per Dr. Pia Mau, surgery should be reconsidered should the patient developed worsening of heart failure syndrome and she has to be off Brilinta for 7 days. He has also requested psychiatric treatment program for IV drug use to be established before considering cardiac surgery intervention.  Dyspnea: Patient is tachypneic and using accessory muscles with conversation. She is still oxygenating in the 90's on room air. D Dimer positive at 3.55. CTA chest has been ordered to rule out PE.  CVA: The patient has had a small infarct of left MCA territory. TPA was deferred as concern for septic emboli. The patient underwent a leftcommon carotid arteriogram followed by complete revascularization of occluded M3 region of inferior division ofleft MCA . She is also s/p placement of a 4.5 mm x 73m pipeline flow diverter at site of focal dissection associated with a small pseudoaneurysm of distal cervical LT ICA. She has been continued on ASA and Brillinta. A1c is 5.9, VLDL was 52. She is on lipitor 80 mg daily.  Dyspnea: CXR - no acute changes. D Dimer: elevated at 3.55. CTA chest: pending.  Hypokalemia: Resolved. Monitor.  Dyslipidemia: With low HDL less than 10, high VLDL at 52, high triglyceride at 258. She is on Lipitor.  Sinus tachycardia/ Tachypnea: Drug withdrawal vs deconditioning. The patient has been started on as needed ativan.TSH 0.8. She is also receiving scheduled low-dose Lopressor with holding parameters. Cardiology has advised caution with any increase in dose of beta blocker due to the patient's severe AR.  Right common femoral vein DVT: Not  a candidate for anticoagulation due to large right groin hematoma post procedure and endocarditis. She has undergone placement of IVC filter on March 26 by IR.  Large right groin hematoma post procedure: Pseudoaneurysm addressed with S/P 29F angioseal for hemostasisat therightCFA access site. CBC stable with hemoglobin of 9.9.  Blood loss anemia, likely from right groin hematoma. Status post PRBC transfusion x2 on March 27. GI Dr Penelope Coop consulted on March 27 who do not think patient has any signs of GI bleed. Monitor CBC. Currently stable at 9.9.  B12 deficiency: Supplement.  Hyponatremia: Present on admission. Now resolved. Monitor.  Hepatitis C:  Antibody positive, viral load negative. HIV screening negative RPR screening negative  Polysubstance abuse: UDS on presentation positive for THC amphetamine. Suspect IV drug use with heroin, multiple needle marks bilateral upper extremities. Patient state IV drug use ongoing for many years now she is waiting to quit. Positive for benzodiazepines on 04/12/2019. Pt must complete her IV antibiotics in a supervised setting for this reason.  Anxiety versus drug withdrawal:  Positive for benzodiazepines on admission. She has an episode of crying, acting erratic on [place] 4 a.m. Appears to be responding to as needed Ativan. UDS positive for benzodiazepines on 04/12/2019. I have also started scheduled Buspar.   4:47 PM on 04/12/2019 RN report patient become more confused with increased slurred speech Stat CT head demonstrated evolving areas of recent infarction better seen on prior MRI. Petechial hemorrhage is not visible. There is no discrete hematoma. No acute interval changes.   Tachycardia: Due to deconditioning, vs response to acute illness vs CMO due to polysubstance abuse  Debility: Per PT she needs assistant for bed mobility and transfers. She needs supervision by one person hand-held for ambulation and gait. She was able to ambulate 80 feet on  Tyanna 2nd. Gait Pattern/deviations: Step-through pattern;Decreased stride length;Wide base of support;Trunk flexed. General Gait Details: Mild unsteadiness, pt reaching for furniture required HHA to improve balance. HR 110 bpm at it's highest.   The patient has been seen and examined by me. I have spent 35 minutes in her evaluation and care.  DVT Prophylaxis:scd's Code Status: full Family Communication: none present Disposition Plan: patient is from home. Discharge SNF vs home. Barriers to discharge. The patient will need to complete her 6 weeks of IV Vancomycin and Rocephin as inpatient for safety reasons due to history of IVDU. Her last dose will be on 05/17/2019. She has infective endocarditis and septic emboli due to IV drug abuse. She also has no insurance although she may be able to get Medicaid.   Lauren Lorenzo, DO [facility] Direct contact: see [email or web address]  7PM-7AM contact night coverage as above 04/19/2019, 1:20 PM  LOS: 12 days

## 2019-04-20 LAB — CBC
HCT: 36.1 % (ref 36.0–46.0)
Hemoglobin: 11.3 g/dL — ABNORMAL LOW (ref 12.0–15.0)
MCH: 27.3 pg (ref 26.0–34.0)
MCHC: 31.3 g/dL (ref 30.0–36.0)
MCV: 87.2 fL (ref 80.0–100.0)
Platelets: 466 10*3/uL — ABNORMAL HIGH (ref 150–400)
RBC: 4.14 MIL/uL (ref 3.87–5.11)
RDW: 20.4 % — ABNORMAL HIGH (ref 11.5–15.5)
WBC: 4.3 10*3/uL (ref 4.0–10.5)
nRBC: 0 % (ref 0.0–0.2)

## 2019-04-20 LAB — BASIC METABOLIC PANEL
Anion gap: 11 (ref 5–15)
BUN: 7 mg/dL (ref 6–20)
CO2: 22 mmol/L (ref 22–32)
Calcium: 9 mg/dL (ref 8.9–10.3)
Chloride: 106 mmol/L (ref 98–111)
Creatinine, Ser: 0.65 mg/dL (ref 0.44–1.00)
GFR calc Af Amer: >60 mL/min (ref >60)
GFR calc non Af Amer: >60 mL/min (ref >60)
Glucose, Bld: 86 mg/dL (ref 70–99)
Potassium: 4.4 mmol/L (ref 3.5–5.1)
Sodium: 139 mmol/L (ref 135–145)

## 2019-04-20 MED ORDER — FUROSEMIDE 10 MG/ML IJ SOLN
20.0000 mg | Freq: Two times a day (BID) | INTRAMUSCULAR | Status: DC
  Administered 2019-04-20 – 2019-04-25 (×12): 20 mg via INTRAVENOUS
  Filled 2019-04-20 (×12): qty 4

## 2019-04-20 MED ORDER — POTASSIUM CHLORIDE CRYS ER 20 MEQ PO TBCR
20.0000 meq | EXTENDED_RELEASE_TABLET | Freq: Every day | ORAL | Status: DC
  Administered 2019-04-20 – 2019-04-27 (×8): 20 meq via ORAL
  Filled 2019-04-20 (×8): qty 1

## 2019-04-20 NOTE — Progress Notes (Signed)
Cardiology Progress Note  Patient ID: Lauren Reyes MRN: [id number] DOB: 29-Nov-1990 Date of Encounter: 04/20/2019  Primary Cardiologist: No primary care provider on file.  Subjective  No complaints this AM. Tele with sinus tachycardia.   ROS:  All other ROS reviewed and negative. Pertinent positives noted in the HPI.     Inpatient Medications  Scheduled Meds: . aspirin  81 mg Oral Daily  . atorvastatin  80 mg Per Tube q1800  . busPIRone  7.5 mg Oral BID  . chlorhexidine gluconate (MEDLINE KIT)  15 mL Mouth Rinse BID  . Chlorhexidine Gluconate Cloth  6 each Topical Daily  . escitalopram  10 mg Oral Daily  . feeding supplement  1 Container Oral TID BM  . furosemide  20 mg Intravenous Q12H  . metoprolol tartrate  12.5 mg Oral BID  . pneumococcal 23 valent vaccine  0.5 mL Intramuscular Tomorrow-1000  . potassium chloride  20 mEq Oral Daily  . QUEtiapine  25 mg Oral QHS  . sodium chloride flush  10-40 mL Intracatheter Q12H  . ticagrelor  90 mg Oral BID  . vitamin B-12  1,000 mcg Oral Daily   Continuous Infusions: . sodium chloride 10 mL/hr at 04/15/19 2255  . cefTRIAXone (ROCEPHIN)  IV 2 g (04/20/19 1002)  . vancomycin 750 mg (04/20/19 0839)   PRN Meds: sodium chloride, acetaminophen **OR** [DISCONTINUED] acetaminophen (TYLENOL) oral liquid 160 mg/5 mL **OR** acetaminophen, LORazepam, melatonin, ondansetron (ZOFRAN) IV, senna-docusate, sodium chloride flush   Vital Signs   Vitals:   04/20/19 0002 04/20/19 0359 04/20/19 0745 04/20/19 0822  BP: (!) 114/46 (!) 110/46 (!) 118/57   Pulse: (!) 101 (!) 107 96   Resp: _0 Temp: (!) 97.5 F (36.4 C) 97.9 F (36.6 C) 98.2 F (36.8 C)   TempSrc: Oral  Axillary   SpO2: 98% 98% 96%   Weight:      Height:        Intake/Output Summary (Last 24 hours) at 04/20/2019 1009 Last data filed at 04/20/2019 2694 Gross per 24 hour  Intake 10 ml  Output --  Net 10 ml   Last 3 Weights 04/01/2019 04/01/2019  Weight (lbs)  159 lb 6.3 oz 159 lb 6.3 oz  Weight (kg) 72.3 kg 72.3 kg      Telemetry  Overnight telemetry shows sinus tachycardia 100-110 bpm, which I personally reviewed.   ECG  The most recent ECG shows sinus tachycardia 120 bpm, which I personally reviewed.   Physical Exam   Vitals:   04/20/19 0002 04/20/19 0359 04/20/19 0745 04/20/19 0822  BP: (!) 114/46 (!) 110/46 (!) 118/57   Pulse: (!) 101 (!) 107 96   Resp: _1 Temp: (!) 97.5 F (36.4 C) 97.9 F (36.6 C) 98.2 F (36.8 C)   TempSrc: Oral  Axillary   SpO2: 98% 98% 96%   Weight:      Height:         Intake/Output Summary (Last 24 hours) at 04/20/2019 1009 Last data filed at 04/20/2019 8546 Gross per 24 hour  Intake 10 ml  Output --  Net 10 ml    Last 3 Weights 04/01/2019 04/01/2019  Weight (lbs) 159 lb 6.3 oz 159 lb 6.3 oz  Weight (kg) 72.3 kg 72.3 kg    Body mass index is 26.52 kg/m.   General: Well nourished, well developed, in no acute distress Head: Atraumatic, normal size  Eyes: PEERLA, EOMI  Neck: Supple, no JVD Endocrine:  No thryomegaly Cardiac: S1, S2, diastolic rumble noted  Lungs: Clear to auscultation bilaterally, no wheezing, rhonchi or rales  Abd: Soft, nontender, no hepatomegaly  Ext: No edema, pulses 2+ Musculoskeletal: No deformities, BUE and BLE strength normal and equal Skin: Warm and dry, no rashes   Neuro: Alert and oriented to person, place, time, and situation, CNII-XII grossly intact, no focal deficits  Psych: Normal mood and affect   Labs  High Sensitivity Troponin:  No results for input(s): TROPONINIHS in the last 720 hours.   Cardiac EnzymesNo results for input(s): TROPONINI in the last 168 hours. No results for input(s): TROPIPOC in the last 168 hours.  Chemistry Recent Labs  Lab 04/15/19 0843 04/17/19 1012 04/18/19 0610  NA 139 139 138  K 3.4* 3.4* 3.7  CL 107 109 108  CO2 23 21* 20*  GLUCOSE 109* 137* 96  BUN 5* 6 6  CREATININE 0.64 0.65 0.63  CALCIUM 8.3* 8.4* 8.0*   PROT  --   --  6.0*  ALBUMIN  --   --  2.2*  AST  --   --  22  ALT  --   --  19  ALKPHOS  --   --  67  BILITOT  --   --  0.5  GFRNONAA >60 >60 >60  GFRAA >60 >60 >60  ANIONGAP _0 Hematology Recent Labs  Lab 04/15/19 0843 04/17/19 1012 04/18/19 0610  WBC 11.1* 6.4 3.5*  RBC 3.67* 3.63* 3.63*  HGB 9.9* 10.1* 9.9*  HCT 32.0* 31.9* 31.9*  MCV 87.2 87.9 87.9  MCH 27.0 27.8 27.3  MCHC 30.9 31.7 31.0  RDW 20.9* 20.2* 20.1*  PLT 607* 529* 506*   BNP Recent Labs  Lab 04/15/19 2044  BNP 590.1*    DDimer  Recent Labs  Lab 04/19/19 1113  DDIMER 3.55*     Radiology  CT ANGIO CHEST PE W OR WO CONTRAST  Result Date: 04/19/2019 CLINICAL DATA:  29 year old female with shortness of breath. Endocarditis involving multiple heart valves. Status post code stroke presentation with suspicion of septic emboli imaging of the brain and upper chest last month. EXAM: CT ANGIOGRAPHY CHEST WITH CONTRAST TECHNIQUE: Multidetector CT imaging of the chest was performed using the standard protocol during bolus administration of intravenous contrast. Multiplanar CT image reconstructions and MIPs were obtained to evaluate the vascular anatomy. CONTRAST:  61m OMNIPAQUE IOHEXOL 350 MG/ML SOLN COMPARISON:  Noncontrast chest CT 04/02/2019. Recent chest radiographs. CT Abdomen and Pelvis 04/04/2019 FINDINGS: Cardiovascular: Good contrast bolus timing in the pulmonary arterial tree. Mild respiratory motion. No focal filling defect identified in the pulmonary arteries to suggest acute pulmonary embolism. Mild cardiomegaly. No pericardial effusion. Negative visible aorta. Mediastinum/Nodes: No mediastinal mass or lymphadenopathy. Lungs/Pleura: Moderate size layering bilateral pleural effusions with simple fluid density suggesting a transudate. Lower lung volumes and bilateral compressive atelectasis. New mild pulmonary septal thickening. Underlying scattered bilateral pulmonary nodules and and confluent  peribronchial opacity, several with areas of cavitation. One such peribronchial opacity in the right upper lobe on series 7, image 30 is new since last month. Lower lobe involvement appears similar. Major airways remain patent. Upper Abdomen: Confluent and wedge-shaped hypoenhancing areas in the spleen compatible with splenic infarcts on series 5, image 77, stable from the prior CT Abdomen and Pelvis. Negative visible liver, pancreas, adrenal glands, kidneys and stomach. Musculoskeletal: No osseous abnormality identified. Review of the MIP images confirms the above findings. IMPRESSION: 1. Negative for acute pulmonary embolus. Mild  cardiomegaly. Negative visible aorta. 2. Possible mild pulmonary edema with new moderate size layering bilateral pleural effusions with new compressive atelectasis. 3. Superimposed septic pulmonary emboli; bilateral pulmonary nodules and bronchopneumonia with some cavitation. Mild progression in the right upper lobe since 04/02/2019. 4. Stable splenic infarcts from the prior CT Abdomen and Pelvis. Electronically Signed   By: Genevie Ann M.D.   On: 04/19/2019 17:34   DG CHEST PORT 1 VIEW  Result Date: 04/19/2019 CLINICAL DATA:  Shortness of breath. EXAM: PORTABLE CHEST 1 VIEW COMPARISON:  Aaliyah 4, 2021 FINDINGS: The right PICC line crosses midline and terminates to the left. The heart, hila, and mediastinum are normal. Bilateral pulmonary opacities persist but have improved. No other interval changes. IMPRESSION: 1. The right PICC line has change in position in the distal tip is now to the left of midline. The tip is likely in the distal left brachiocephalic vein. The patient may benefit from repositioning. 2. Persistent but mildly improved bilateral pulmonary infiltrates. Electronically Signed   By: Dorise Bullion III M.D   On: 04/19/2019 14:24    Cardiac Studies  TEE 04/07/2019 1. Left ventricular ejection fraction, by estimation, is 55 to 60%. The  left ventricle has normal  function. The left ventricle has no regional  wall motion abnormalities.  2. Right ventricular systolic function is normal. The right ventricular  size is normal. There is moderately elevated pulmonary artery systolic  pressure.  3. No left atrial/left atrial appendage thrombus was detected.  4. Echodense small vegetation seen on atrial surface at A2-P2 leaflet  junction. . The mitral valve is abnormal. Mild mitral valve regurgitation.  No evidence of mitral stenosis.  5. Large mobile echodense structure projecting into right atrium.  Measures >2 cm in length. . The tricuspid valve is abnormal. Tricuspid  valve regurgitation is mild to moderate.  6. Moderately thickened. Abnormal structure, appears functionally  bicuspid vs. Tricuspid with partially fused leaflet. Irregular thickened  leaflet structure suggestive of endocarditis. Severe aortic regurgitation,  with the flow filling the entire LVOT.Marland Kitchen  The aortic valve has an indeterminant number of cusps. Aortic valve  regurgitation is severe.  7. Agitated saline contrast bubble study was positive with shunting  observed after >6 cardiac cycles suggestive of intrapulmonary shunting.   Patient Profile  Lauren Reyes is a 29 y.o. female with history of IVDA who was admitted 04/01/2019 for acute L MCA stroke s/p thrombectomy. C/b left ICA dissection with stent placement. Found to have valvular endocarditis of the aortic, mitral, and tricuspid valves.   Assessment & Plan   Multivalvular Endocarditis -severe AI with likely perforated leaflets on my review of her aortic valve. Given the destructive nature of the infection, I would favor early surgery but this is not able to be done per CT surgery  -2cm + mass on TV with at least moderate, likely severe TR -small mass on MV -overall, this is surgical disease but recent ICA stent is an issue.  -will defer to CT surgery but appears not a candidate currently -cardiology to follow  remotely. No cardiac medical therapy for her valvular heart disease. Antibiotics per hospital medicine and ID.   [facility] will sign off.   Medication Recommendations:  Antibiotics  Other recommendations (labs, testing, etc):  none Follow up as an outpatient:  Please let us know if she will be able to be discharged and we can arrange outpatient follow-up   For questions or updates, please contact [place] Please consult [email or web address] for  contact info under   Time Spent with Patient: I have spent a total of 25 minutes with patient reviewing hospital notes, telemetry, EKGs, labs and examining the patient as well as establishing an assessment and plan that was discussed with the patient.  > 50% of time was spent in direct patient care.    Signed, Addison Naegeli. Audie Box, [place]  04/20/2019 10:09 AM

## 2019-04-20 NOTE — Progress Notes (Signed)
Physical Therapy Treatment Patient Details Name: Lauren Reyes MRN: [id number] DOB: 08/24/1990 Today's Date: 04/20/2019    History of Present Illness 29 year old female with suspected IV drug abuse who was being interviewed by police tonight and developed right-sided facial weakness, aphasia, and R weakness.  She was brought by EMS to the emergency room work-up revealed occluded left MCA. Revascularization performed by IR. CT head and neck demonstrate multiple small cavitary abscesses in the upper lobes bilaterally. IVC filter placement for RLE DVT on 3/26.    PT Comments    Pt emotional this session due to upcoming procedure, but agreeable to OOB mobility. Pt tolerated higher level dynamic balance challenge well this session, though gait deviations and increased time/effort required to perform. Pt with tachycardia (HRmax 126 bpm) and tachypneic (up to 45 breaths/min) with gait training this session, VS improved with rest. PT to continue to follow acutely.    Follow Up Recommendations  Home health PT     Equipment Recommendations  None recommended by PT    Recommendations for Other Services       Precautions / Restrictions Precautions Precautions: Fall Precaution Comments: impulsive Restrictions Weight Bearing Restrictions: No    Mobility  Bed Mobility Overal bed mobility: Independent Bed Mobility: Supine to Sit;Sit to Supine     Supine to sit: Modified independent (Device/Increase time) Sit to supine: Modified independent (Device/Increase time)   General bed mobility comments: Mod I for increased time and use of bedrail.  Transfers Overall transfer level: Needs assistance Equipment used: None Transfers: Sit to/from Stand Sit to Stand: Supervision         General transfer comment: supervision for safety, slow to rise but no overt unsteadiness noted.  Ambulation/Gait Ambulation/Gait assistance: Supervision Gait Distance (Feet): 375 Feet Assistive device:  None Gait Pattern/deviations: Decreased stride length;Narrow base of support;Step-through pattern;Drifts right/left Gait velocity: decr   General Gait Details: supervision for safety, verbal cuing for upright posture, and PT challenged dynamic standing balance this session with no LOB but did demonstrate mild difficulty to perform (see balance section). HRmax 126 bpm, RR to 40s at end of ambulation.   Stairs             Wheelchair Mobility    Modified Rankin (Stroke Patients Only) Modified Rankin (Stroke Patients Only) Pre-Morbid Rankin Score: No symptoms Modified Rankin: Moderate disability     Balance Overall balance assessment: Needs assistance Sitting-balance support: Feet supported Sitting balance-Leahy Scale: Good     Standing balance support: No upper extremity supported Standing balance-Leahy Scale: Good Standing balance comment: tolerates mild-moderate challenge to gait             High level balance activites: Head turns;Backward walking;Other (comment) High Level Balance Comments: WFL: increased gait speed, turn and stop, directional changes. Mild difficulty and increased time to perform: horizontal and vertical head turns (near scissoring due to narrow BOS), tandem walking tolerated ~5 ft, backward walking            Cognition Arousal/Alertness: Awake/alert Behavior During Therapy: WFL for tasks assessed/performed Overall Cognitive Status: Impaired/Different from baseline Area of Impairment: Safety/judgement;Problem solving                         Safety/Judgement: Decreased awareness of safety;Decreased awareness of deficits   Problem Solving: Decreased initiation;Requires verbal cues General Comments: Pt tearful with PT this session, states "I have to have surgery, I am scared". 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 PT Goal Formulation: With family Time For Goal Achievement: 04/18/19 Potential to Achieve Goals: Good Progress towards PT goals: Progressing toward goals    Frequency    Min 4X/week      PT Plan Current plan remains appropriate    Co-evaluation              AM-PAC PT "6 Clicks" Mobility   Outcome Measure  Help needed turning from your back to your side while in a flat bed without using bedrails?: None Help needed moving from lying on your back to sitting on the side of a flat bed without using bedrails?: None Help needed moving to and from a bed to a chair (including a wheelchair)?: None Help needed standing up from a chair using your arms (e.g., wheelchair or bedside chair)?: None Help needed to walk in hospital room?: None Help needed climbing 3-5 steps with a railing? : A Little 6 Click Score: 23    End of Session Equipment Utilized During Treatment: Gait belt Activity Tolerance: Patient tolerated treatment well Patient left: in bed;with call bell/phone within reach Nurse Communication: Mobility status PT Visit Diagnosis: Other abnormalities of gait and mobility (R26.89);Unsteadiness on feet (R26.81);Difficulty in walking, not elsewhere classified (R26.2)     Time: 1450-1502 PT Time Calculation (min) (ACUTE ONLY): 12 min  Charges:  $Gait Training: 8-22 mins                     Rache Klimaszewski E, PT Acute Rehabilitation Services Pager [phone]  Office [phone]    Lorenso Quirino D Despina Hidden 04/20/2019, 4:22 PM

## 2019-04-20 NOTE — Progress Notes (Signed)
Nutrition Follow-up  DOCUMENTATION CODES:   Not applicable  INTERVENTION:  D/c Boost Breeze po TID, each supplement provides 250 kcal and 9 grams of protein  Magic cup TID with meals, each supplement provides 290 kcal and 9 grams of protein   NUTRITION DIAGNOSIS:   Increased nutrient needs related to acute illness as evidenced by estimated needs.  Ongoing.  GOAL:   Patient will meet greater than or equal to 90% of their needs  Progressing.  MONITOR:   I & O's, Labs, Supplement acceptance, PO intake, Weight trends  REASON FOR ASSESSMENT:   Ventilator    ASSESSMENT:   Pt with suspected IV drug abuse who while being interviewed by police developed left-sided weakness admitted with L MCA s/p IR for revascularization and L ICA stent.  3/24 - OG tube placed, intubated 3/27 - OG removed, extubated 3/29 - Regular diet ordered 3/30 - s/p Transesophageal echocardiogram  Pt reports appetite is improving, but states her appetite PTA was very poor. Pt endorses consuming mostly soda and juice and very little food intake. Pt attributes this to her drug use. Pt denies any wt changes over the last year.   Pt currently receiving Boost Breeze TID; however, pt reports she does not like it and no longer wants to receive it. Pt is agreeable to trying [employer].   PO Intake: 50-100% x last 8 recorded meals (81% average intake)  Labs reviewed. Medications reviewed and include: Lasix, Klor-con, Vitamin B12, IV abx   NUTRITION - FOCUSED PHYSICAL EXAM:    Most Recent Value  Orbital Region  No depletion  Upper Arm Region  No depletion  Thoracic and Lumbar Region  No depletion  Buccal Region  No depletion  Temple Region  No depletion  Clavicle Bone Region  No depletion  Clavicle and Acromion Bone Region  No depletion  Scapular Bone Region  No depletion  Dorsal Hand  No depletion  Patellar Region  No depletion  Anterior Thigh Region  No depletion  Posterior Calf Region  No  depletion  Edema (RD Assessment)  None  Hair  Reviewed  Eyes  Reviewed  Mouth  Reviewed  Skin  Reviewed  Nails  Reviewed       Diet Order:   Diet Order            Diet regular Room service appropriate? Yes; Fluid consistency: Thin  Diet effective now              EDUCATION NEEDS:   No education needs have been identified at this time  Skin:  Skin Assessment: Reviewed RN Assessment  Last BM:  4/11  Height:   Ht Readings from Last 1 Encounters:  04/01/19 5\' 5"  (1.651 m)    Weight:   Wt Readings from Last 1 Encounters:  04/01/19 72.3 kg    BMI:  Body mass index is 26.52 kg/m.  Estimated Nutritional Needs:   Kcal:  1900-2100  Protein:  100-115 grams  Fluid:  > 1.9 L/day    04/03/19, MS, RD, LDN RD pager number and weekend/on-call pager number located in Amion.

## 2019-04-20 NOTE — Progress Notes (Addendum)
PROGRESS NOTE  Lauren Reyes YQM:[id number] DOB: 1990/02/01 DOA: 04/01/2019 PCP: Patient, No Pcp Per  Brief History   Lauren Reyes a 29 y.o.femalewith unknown past medical history, possible heroin abuse presents to the emergency department as a code stroke with aphasia and right-sided weakness.  [place] PD called to the hotel room where she was staying as she had overstated her visit,patient was  Found by PD talking on the phone and suddenly started to slur her speech, the right facial droop and had difficulty talking. When EMS arrived patient still communicating but slowly progressively got worse.   Patient was admitted to neurology ICU intubated after  Thrombectomy She underwent thrombectomy for Left MCA m3 occlusion with recanalization complicated by dissection/pseudoanerusym of left ICA s/p stent. She was extubated on March 27.   This morning nursing communicated that the patient had increased heart rate and respiratory rate. This has been an issue for many days. EKG demonstrated only sinus tachycardia. This is assumed to be due to deconditioning or withdrawal, probably from benzodiazepines for which the patient was positive on admission. Ativan has been made available on as needed basis and she has been prescribed scheduled Buspar. This seems to have helped her anxiety.  CTS has been consulted and has stated that the patient is not currently a candidate for surgery. However, they do state that surgery must be considered if the patient deteriorates acutely. Infectious disease is consulted. They have recommended continuing Vancomycin and ceftriaxone for a total of 6 weeks. The last dose is to be given on 05/17/2019. IV antibiotics must be given in a supervised setting due to history of polysubstance abuse and IVDU.  This morning the patient is tachypneic and short of breath. D Dimer has been ordered and was elevated at 3.55. CTA of chest has been ordered to rule out  pulmonary embolus. No pulmonary embolus was seen. However, the patient's CTA did demonstrate mild  progression of cavitary bronchopneumonia with nodules and septic pulmonary emboli . There was also some mild pulmonary edema with new moderate sized layering bilateral pleural effusion with new compressive atelectasis.  Cardiology has now signed off as there is no medical therapy other than continued antibiotics to address the patient's multivalvular endocarditis. CTS has been called back. They tentatively have decided to go forward with plans for AVR and possible TV repair/replacement early next week. Her brillinta has been stopped for this reason.   Consultants  . PCCM . Gastroenterology . Cardiothoracic Surgery . Infectious disease.  Procedures  . TEE  Antibiotics   Anti-infectives (From admission, onward)   Start     Dose/Rate Route Frequency Ordered Stop   04/15/19 0900  vancomycin (VANCOREADY) IVPB 750 mg/150 mL     750 mg 150 mL/hr over 60 Minutes Intravenous Every 8 hours 04/15/19 0821     04/13/19 1400  vancomycin (VANCOCIN) IVPB 1000 mg/200 mL premix  Status:  Discontinued     1,000 mg 200 mL/hr over 60 Minutes Intravenous Every 8 hours 04/13/19 0839 04/15/19 0821   04/12/19 2200  vancomycin (VANCOREADY) IVPB 750 mg/150 mL  Status:  Discontinued     750 mg 150 mL/hr over 60 Minutes Intravenous Every 8 hours 04/12/19 1500 04/13/19 0839   04/10/19 1336  vancomycin (VANCOCIN) IVPB 1000 mg/200 mL premix  Status:  Discontinued     1,000 mg 200 mL/hr over 60 Minutes Intravenous Every 8 hours 04/10/19 1320 04/12/19 1500   04/08/19 1200  cefTRIAXone (ROCEPHIN) 2 g in sodium chloride 0.9 %  100 mL IVPB     2 g 200 mL/hr over 30 Minutes Intravenous Every 12 hours 04/08/19 1013 05/17/19 2359   04/08/19 0530  vancomycin (VANCOCIN) IVPB 1000 mg/200 mL premix  Status:  Discontinued     1,000 mg 200 mL/hr over 60 Minutes Intravenous Every 12 hours 04/07/19 1541 04/10/19 1320   04/07/19 1900   Ampicillin-Sulbactam (UNASYN) 3 g in sodium chloride 0.9 % 100 mL IVPB  Status:  Discontinued     3 g 200 mL/hr over 30 Minutes Intravenous Every 6 hours 04/07/19 1457 04/08/19 1013   04/07/19 1730  vancomycin (VANCOREADY) IVPB 1500 mg/300 mL     1,500 mg 150 mL/hr over 120 Minutes Intravenous  Once 04/07/19 1541 04/07/19 1937   04/04/19 1400  ceFAZolin (ANCEF) IVPB 2g/100 mL premix  Status:  Discontinued     2 g 200 mL/hr over 30 Minutes Intravenous Every 8 hours 04/04/19 1119 04/07/19 1457   04/04/19 0400  Vancomycin (VANCOCIN) 1,250 mg in sodium chloride 0.9 % 250 mL IVPB  Status:  Discontinued     1,250 mg 166.7 mL/hr over 90 Minutes Intravenous Every 12 hours 04/03/19 1635 04/04/19 1159   04/02/19 1800  ceFEPIme (MAXIPIME) 2 g in sodium chloride 0.9 % 100 mL IVPB  Status:  Discontinued     2 g 200 mL/hr over 30 Minutes Intravenous Every 8 hours 04/02/19 1218 04/04/19 1127   04/02/19 1600  vancomycin (VANCOCIN) IVPB 1000 mg/200 mL premix  Status:  Discontinued     1,000 mg 200 mL/hr over 60 Minutes Intravenous Every 12 hours 04/02/19 0100 04/03/19 1635   04/02/19 0200  piperacillin-tazobactam (ZOSYN) IVPB 3.375 g  Status:  Discontinued     3.375 g 12.5 mL/hr over 240 Minutes Intravenous Every 8 hours 04/02/19 0059 04/02/19 1219   04/02/19 0200  vancomycin (VANCOCIN) 1,500 mg in sodium chloride 0.9 % 500 mL IVPB     1,500 mg 250 mL/hr over 120 Minutes Intravenous  Once 04/02/19 0100 04/02/19 0923   04/01/19 2040  ceFAZolin (ANCEF) 2-4 GM/100ML-% IVPB    Note to Pharmacy: [name]   : cabinet override      04/01/19 2040 04/02/19 0844      Subjective  The patient states that her breathing is better. No new complaints, but she does express anxiety and concern over news she received from cardiology this morning. She is very tearful.  Objective   Vitals:  Vitals:   04/20/19 0822 04/20/19 1224  BP:  (!) 112/58  Pulse:  (!) 103  Resp: 18 19  Temp:  98.1 F (36.7 C)  SpO2:   95%   Exam:  Constitutional:  . The patient is awake, alert, and oriented x 3. She is tearful and anxious. d Respiratory:  . Positive for conversational dyspnea and tachypnea. . No wheezes, rales, or rhonchi . No tactile fremitus Cardiovascular:  . Regular rate and rhythm . No murmurs, ectopy, or gallups. . No lateral PMI. No thrills. Abdomen:  . Abdomen is soft, non-tender, non-distended . No hernias, masses, or organomegaly . Normoactive bowel sounds.  Musculoskeletal:  . No cyanosis, clubbing, or edema Skin:  . No rashes, lesions, ulcers . palpation of skin: no induration or nodules Neurologic:  . No neurological deficits on exam. Psychiatric:  Pt appears more calm.   I have personally reviewed the following:   Today's Data  . Vitals, BMP, CBC, D Dimer (+)  Micro Data  .  Culture Tracheal aspirate - MSSA  Imaging  . CT head . CT abdomen and pelvis . CXR; no acute pathology . CTA chest: pending  Cardiology Data  . EKG  Scheduled Meds: . aspirin  81 mg Oral Daily  . atorvastatin  80 mg Per Tube q1800  . busPIRone  7.5 mg Oral BID  . chlorhexidine gluconate (MEDLINE KIT)  15 mL Mouth Rinse BID  . Chlorhexidine Gluconate Cloth  6 each Topical Daily  . escitalopram  10 mg Oral Daily  . feeding supplement  1 Container Oral TID BM  . furosemide  20 mg Intravenous Q12H  . metoprolol tartrate  12.5 mg Oral BID  . pneumococcal 23 valent vaccine  0.5 mL Intramuscular Tomorrow-1000  . potassium chloride  20 mEq Oral Daily  . QUEtiapine  25 mg Oral QHS  . sodium chloride flush  10-40 mL Intracatheter Q12H  . vitamin B-12  1,000 mcg Oral Daily   Continuous Infusions: . sodium chloride 10 mL/hr at 04/15/19 2255  . cefTRIAXone (ROCEPHIN)  IV Stopped (04/20/19 1100)  . 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  Septic emboli have been seen in left hemisphere of brain, in  lung, spleen with possible splenic and kidney infarcts. Initially the patient was treated with Vancomycin and Unasyn. She is currently receiving Vancomycin and Rocephin. Her fever has resolved. Per infectious disease the patient will require 6 weeks total of this regimen with her last dose being 05/17/2019. WBC has declined from 29+ to 13.5 today. The patient has been evaluated by thoracic surgery, Dr. Servando Snare: patient eventually will require aortic valve replacement, however this is complicated by recent stroke and recent stent on aspirin and Brilinta, as well as a recent decline in mental status.  Per Dr. Pia Mau, surgery should be reconsidered should the patient developed worsening of heart failure syndrome and she has to be off Brilinta for 7 days. However, the patient was re-evaluated by CTS today. They have stopped her Brillinta and plan for AVR and possible TV repair or replacement early next week. Cardiology has signed off. Other than antibiotics, there is no medical management available to address the patient's severe multi valvular endocarditis.  Dyspnea: Improved. CTS demonstrated pulmonary edema and pleural effusions with compressive atelectasis. The patient has been started on lasix. CT chest also demonstrated mild  progression of cavitary bronchopneumonia with nodules and septic pulmonary emboli . Patient denies shortness of breath. No PE was seen on CTA chest.  She is  currently saturating in the mid nineties on room air. Monitor volume status.  CVA: The patient has had a small infarct of left MCA territory. TPA was deferred as concern for septic emboli. The patient underwent a leftcommon carotid arteriogram followed by complete revascularization of occluded M3 region of inferior division ofleft MCA . She is also s/p placement of a 4.5 mm x 69m pipeline  flow diverter at site of focal dissection associated with a small pseudoaneurysm of distal cervical LT ICA. She has been continued on ASA and Brillinta. A1c is 5.9, VLDL was 52. She is on lipitor 80 mg daily.  Dyspnea: CXR - no acute changes. D Dimer: elevated at 3.55. CTA chest: pending.  Hypokalemia: Resolved. Monitor.  Dyslipidemia: With low HDL less than 10, high VLDL at 52, high triglyceride at 258. She is on Lipitor.  Sinus tachycardia/ Tachypnea: Drug withdrawal vs deconditioning. The patient has been started on as needed ativan.TSH 0.8. She is also receiving scheduled low-dose Lopressor with holding parameters. Cardiology has advised caution with any increase in dose of beta blocker due to the patient's severe AR.  Right common femoral vein DVT: Not a candidate for anticoagulation due to large right groin hematoma post procedure and endocarditis. She has undergone placement of IVC filter on March 26 by IR.  Large right groin hematoma post procedure: Pseudoaneurysm addressed with S/P 1F angioseal for hemostasisat therightCFA access site. CBC stable with hemoglobin of 9.9.  Blood loss anemia, likely from right groin hematoma. Status post PRBC transfusion x2 on March 27. GI Dr Penelope Coop consulted on March 27 who do not think patient has any signs of GI bleed. Monitor CBC. Currently stable at 9.9.  B12 deficiency: Supplement.  Hyponatremia: Present on admission. Now resolved. Monitor.  Hepatitis C:  Antibody positive, viral load negative. HIV screening negative RPR screening  negative  Polysubstance abuse: UDS on presentation positive for THC amphetamine. Suspect IV drug use with heroin, multiple needle marks bilateral upper extremities. Patient state IV drug use ongoing for many years now she is waiting to quit. Positive for benzodiazepines on 04/12/2019. Pt must complete her IV antibiotics in a supervised setting for this reason.  Anxiety versus drug withdrawal:  Positive for benzodiazepines on admission. She has an episode of crying, acting erratic on [place] 4 a.m. Appears to be responding to as needed Ativan. UDS positive for benzodiazepines on 04/12/2019. I have also started scheduled Buspar.   4:47 PM on 04/12/2019 RN report patient become more confused with increased slurred speech Stat CT head demonstrated evolving areas of recent infarction better seen on prior MRI. Petechial hemorrhage is not visible. There is no discrete hematoma. No acute interval changes.   Tachycardia: Due to deconditioning, vs response to acute illness vs CMO due to polysubstance abuse  Debility: Per PT she needs assistant for bed mobility and transfers. She needs supervision by one person hand-held for ambulation and gait. She was able to ambulate 80 feet on [place] 2nd. Gait Pattern/deviations: Step-through pattern;Decreased stride length;Wide base of support;Trunk flexed. General Gait Details: Mild unsteadiness, pt reaching for furniture required HHA to improve balance. HR 110 bpm at it's highest.   The patient has been seen and examined by me. I have spent 32 minutes in her evaluation and care.  DVT Prophylaxis:scd's Code Status: full Family Communication: none present Disposition Plan: patient is from home. Discharge SNF vs home. Barriers to discharge. The patient will need to complete her 6 weeks of IV Vancomycin and Rocephin as inpatient for safety reasons due to history of IVDU. Her last dose will be on 05/17/2019. She has infective endocarditis and septic emboli due to IV drug abuse.  She also has no insurance although she may be able to get Medicaid. The patient has been evaluated again by CTS. They plan to take the patient for surgery early next week. The patient needs referral to rehab before she can  go to surgery.  Merrell Borsuk, DO [facility] Direct contact: see [email or web address]  7PM-7AM contact night coverage as above 04/20/2019, 1:20 PM  LOS: 12 days

## 2019-04-20 NOTE — Progress Notes (Addendum)
      [address]       Lauren Reyes [address]             [phone]      13 Days Post-Op Procedure(s) (LRB): TRANSESOPHAGEAL ECHOCARDIOGRAM (TEE) (N/A) BUBBLE STUDY Subjective: Feels okay this afternoon, she is walking in the halls and independent.   Objective: Vital signs in last 24 hours: Temp:  [97.5 F (36.4 C)-98.4 F (36.9 C)] 98.2 F (36.8 C) (04/12 0745) Pulse Rate:  [96-114] 96 (04/12 0745) Cardiac Rhythm: Sinus tachycardia (04/12 0727) Resp:  [16-24] 18 (04/12 0822) BP: (110-122)/(46-57) 118/57 (04/12 0745) SpO2:  [96 %-100 %] 96 % (04/12 0745)     Intake/Output from previous day: No intake/output data recorded. Intake/Output this shift: Total I/O In: 381 [P.O.:120; I.V.:10; IV Piggyback:251] Out: -   General appearance: alert, cooperative and no distress Heart: regular rate and rhythm, + murmur Lungs: clear to auscultation bilaterally  Lab Results: Recent Labs    04/18/19 0610 04/20/19 0843  WBC 3.5* 4.3  HGB 9.9* 11.3*  HCT 31.9* 36.1  PLT 506* 466*   BMET:  Recent Labs    04/18/19 0610 04/20/19 0843  NA 138 139  K 3.7 4.4  CL 108 106  CO2 20* 22  GLUCOSE 96 86  BUN 6 7  CREATININE 0.63 0.65  CALCIUM 8.0* 9.0    PT/INR: No results for input(s): LABPROT, INR in the last 72 hours. ABG    Component Value Date/Time   PHART 7.347 (L) 04/02/2019 0032   HCO3 30.5 (H) 04/02/2019 0032   TCO2 32 04/02/2019 0032   O2SAT 100.0 04/02/2019 0032   CBG (last 3)  No results for input(s): GLUCAP in the last 72 hours.  Assessment/Plan: S/P Procedure(s) (LRB): TRANSESOPHAGEAL ECHOCARDIOGRAM (TEE) (N/A) BUBBLE STUDY  1. CV-BP well controlled. Remains in sinus tachycardia at times. Appreciate cardiology following along.  2. Pulm-tolerating room air with good oxygen saturation. CT scan of the chest reviewed.  3. Renal-creatinine 0.65, electrolytes ok 4. H and H stable 5. Neuro-Hx of ischemic left MCA stroke, working with  SLP 6. Polysubstance abuse, detoxing while in the hospital. Will need a follow-up plan for drug rehab 7. IV Vancomycin and Rocephin for 6 weeks. Last dose 05/17/2019 8. Hx of panic attacks and anxiety. On Buspar and Lexapro. Ativan PRN.   Plan: Stopped Brilinta today-will need to be off for a total of 7 days. She will need a plan for drug rehab post-surgery. Plan to tentatively do an AVR and possible TV repair/replacement early next week.     LOS: 19 days    [name] 04/20/2019  Mandatory to have appropriate referral and a plan for drug rehab in place before consider surgery - Primary service to make referral please - No plan no surgery -  I have seen and examined Lauren Reyes and agree with the above assessment  and plan.  Delight Ovens MD Beeper [phone] Office [phone] 04/20/2019 4:24 PM

## 2019-04-20 NOTE — Plan of Care (Signed)
  Problem: Education: Goal: Knowledge of disease or condition will improve Outcome: Progressing Goal: Knowledge of secondary prevention will improve Outcome: Progressing Goal: Knowledge of patient specific risk factors addressed and post discharge goals established will improve Outcome: Progressing   Problem: Coping: Goal: Will verbalize positive feelings about self Outcome: Progressing Goal: Will identify appropriate support needs Outcome: Progressing   Problem: Health Behavior/Discharge Planning: Goal: Ability to manage health-related needs will improve Outcome: Progressing   Problem: Self-Care: Goal: Ability to participate in self-care as condition permits will improve Outcome: Progressing Goal: Verbalization of feelings and concerns over difficulty with self-care will improve Outcome: Progressing Goal: Ability to communicate needs accurately will improve Outcome: Progressing   

## 2019-04-20 NOTE — Progress Notes (Signed)
Patient Mews score in yellow due to heart rate . Patient has been sinus tachycardia on monitor rate 100-115 not new for this patient. Will continue to monitor vital signs ever 4 hours and prn.

## 2019-04-20 NOTE — Progress Notes (Signed)
Occupational Therapy Treatment Patient Details Name: Lauren Reyes MRN: [id number] DOB: 07/31/1990 Today's Date: 04/20/2019    History of present illness 29 year old female with suspected IV drug abuse who was being interviewed by police tonight and developed right-sided facial weakness, aphasia, and R weakness.  She was brought by EMS to the emergency room work-up revealed occluded left MCA. Revascularization performed by IR. CT head and neck demonstrate multiple small cavitary abscesses in the upper lobes bilaterally. IVC filter placement for RLE DVT on 3/26.   OT comments  All goals reviewed and updated to reflect pt's progress in therapy. Pt able to engage in LB dressing task while seated EOB, demonstrating good balance and independence. Pt able to ambulate to/from bathroom without use of an AD and with min guard. Noted 0 instances of LOB, however pt unsteady on feet. Educated/instructed pt on safety strategies and activity pacing to increase balance and prevent future falls with fair understanding. Pt completed toileting task, able to complete without use of grab bars. Pt tolerated standing 1 x 5 min at the sink with supervision to complete grooming and bathing tasks. No difficulty locating self-care items with pt able to follow commands consistently. Pt returned to chair and positioned for comfort. Pt reported min fatigue following. OT will continue to follow acutely.    Follow Up Recommendations  Home health OT;Supervision - Intermittent    Equipment Recommendations  3 in 1 bedside commode    Recommendations for Other Services      Precautions / Restrictions Precautions Precautions: Fall Precaution Comments: impulsive Restrictions Weight Bearing Restrictions: No       Mobility Bed Mobility Overal bed mobility: Independent Bed Mobility: Supine to Sit              Transfers Overall transfer level: Needs assistance Equipment used: None Transfers: Sit to/from  Stand Sit to Stand: Supervision         General transfer comment: To ensure safety with line management.     Balance Overall balance assessment: Needs assistance Sitting-balance support: Feet supported Sitting balance-Leahy Scale: Normal       Standing balance-Leahy Scale: Fair                             ADL either performed or assessed with clinical judgement   ADL Overall ADL's : Needs assistance/impaired     Grooming: Wash/dry hands;Wash/dry face;Oral care;Applying deodorant;Set up;Supervision/safety;Standing Grooming Details (indicate cue type and reason): While standing at the sink Upper Body Bathing: Supervision/ safety;Set up;Standing Upper Body Bathing Details (indicate cue type and reason): While standing at the sink         Lower Body Dressing: Set up;Supervision/safety;Sitting/lateral leans Lower Body Dressing Details (indicate cue type and reason): Able to don socks while seated EOB Toilet Transfer: Min guard;Ambulation;Regular Museum/gallery exhibitions officer and Hygiene: Min guard;Sit to/from stand       Functional mobility during ADLs: Min guard General ADL Comments: Pt able to ambulate around room and to/from bathroom with min guard and cues for safety with line management. Pt tolerated standing 1 x 5 min at the sink to complete grooming and bathing tasks. Noted 0 instances of LOB, however pt unsteady on feet.      Vision       Perception     Praxis      Cognition Arousal/Alertness: Awake/alert Behavior During Therapy: WFL for tasks assessed/performed Overall Cognitive Status: Impaired/Different from baseline Area of  Impairment: Safety/judgement;Problem solving                         Safety/Judgement: Decreased awareness of safety;Decreased awareness of deficits   Problem Solving: Decreased initiation;Requires verbal cues General Comments: Pt pleasant and willing to participate in therapy. Pt required cues  to initiate tasks. Pt required cues for safety with line management.         Exercises     Shoulder Instructions       General Comments VSS on RA.     Pertinent Vitals/ Pain       Pain Assessment: No/denies pain  Home Living                                          Prior Functioning/Environment              Frequency           Progress Toward Goals  OT Goals(current goals can now be found in the care plan section)  Progress towards OT goals: Progressing toward goals  ADL Goals Pt Will Perform Grooming: Independently;standing Pt Will Perform Tub/Shower Transfer: with supervision;Tub transfer;grab bars Additional ADL Goal #1: Pt to complete all ADLs including item retrieval independently. Additional ADL Goal #2: Pt to tolerate standing up to 10 min independently, in preparation for ADLs. Additional ADL Goal #3: Pt to recall and verbalize 3 fall prevention strategies with 0 verbal cues.  Plan Discharge plan needs to be updated;Frequency remains appropriate    Co-evaluation                 AM-PAC OT "6 Clicks" Daily Activity     Outcome Measure   Help from another person eating meals?: A Little Help from another person taking care of personal grooming?: A Little Help from another person toileting, which includes using toliet, bedpan, or urinal?: A Little Help from another person bathing (including washing, rinsing, drying)?: A Little Help from another person to put on and taking off regular upper body clothing?: A Little Help from another person to put on and taking off regular lower body clothing?: A Little 6 Click Score: 18    End of Session    OT Visit Diagnosis: Unsteadiness on feet (R26.81);Muscle weakness (generalized) (M62.81);Cognitive communication deficit (R41.841) Symptoms and signs involving cognitive functions: Cerebral infarction   Activity Tolerance Patient tolerated treatment well   Patient Left in chair;with call  bell/phone within reach(Per RN, pt no longer needs chair alarm)   Nurse Communication Mobility status        Time: 4235-3614 OT Time Calculation (min): 26 min  Charges: OT General Charges $OT Visit: 1 Visit OT Treatments $Self Care/Home Management : 8-22 mins $Therapeutic Activity: 8-22 mins  Peterson Ao OTR/L [phone]   Peterson Ao 04/20/2019, 12:22 PM

## 2019-04-21 ENCOUNTER — Inpatient Hospital Stay (HOSPITAL_COMMUNITY): Payer: Medicaid Other

## 2019-04-21 DIAGNOSIS — Z01810 Encounter for preprocedural cardiovascular examination: Secondary | ICD-10-CM

## 2019-04-21 NOTE — Progress Notes (Signed)
Physical Therapy Treatment Patient Details Name: Lauren Reyes MRN: [id number] DOB: January 06, 1991 Today's Date: 04/21/2019    History of Present Illness 29 year old female with suspected IV drug abuse who was being interviewed by police tonight and developed right-sided facial weakness, aphasia, and R weakness.  She was brought by EMS to the emergency room work-up revealed occluded left MCA. Revascularization performed by IR. CT head and neck demonstrate multiple small cavitary abscesses in the upper lobes bilaterally. IVC filter placement for RLE DVT on 3/26.    PT Comments    Patient continues to make progress toward PT goals. PT will continue to follow acutely and progress as tolerated.    Follow Up Recommendations  Home health PT     Equipment Recommendations  None recommended by PT    Recommendations for Other Services       Precautions / Restrictions Precautions Precautions: Fall Precaution Comments: impulsive Restrictions Weight Bearing Restrictions: No    Mobility  Bed Mobility Overal bed mobility: Independent                Transfers Overall transfer level: Modified independent Equipment used: None Transfers: Sit to/from Stand              Ambulation/Gait Ambulation/Gait assistance: Supervision Gait Distance (Feet): 400 Feet Assistive device: None Gait Pattern/deviations: Decreased stride length;Step-through pattern;Drifts right/left Gait velocity: decr   General Gait Details: HR <120 bpm; supervision for safety; mild drifting R and L but no LOB;    Stairs             Wheelchair Mobility    Modified Rankin (Stroke Patients Only) Modified Rankin (Stroke Patients Only) Pre-Morbid Rankin Score: No symptoms Modified Rankin: Moderate disability     Balance Overall balance assessment: Needs assistance Sitting-balance support: Feet supported Sitting balance-Leahy Scale: Good     Standing balance support: No upper extremity  supported Standing balance-Leahy Scale: Good                   Standardized Balance Assessment Standardized Balance Assessment : Dynamic Gait Index   Dynamic Gait Index Level Surface: Mild Impairment Change in Gait Speed: Normal Gait with Horizontal Head Turns: Mild Impairment Gait with Vertical Head Turns: Mild Impairment Gait and Pivot Turn: Normal Step Over Obstacle: Mild Impairment Step Around Obstacles: Normal Steps: Normal Total Score: 20      Cognition Arousal/Alertness: Awake/alert Behavior During Therapy: WFL for tasks assessed/performed Overall Cognitive Status: Impaired/Different from baseline Area of Impairment: Safety/judgement;Problem solving                         Safety/Judgement: Decreased awareness of safety;Decreased awareness of deficits   Problem Solving: Decreased initiation;Requires verbal cues        Exercises      General Comments        Pertinent Vitals/Pain Pain Assessment: No/denies pain    Home Living                      Prior Function            PT Goals (current goals can now be found in the care plan section) Progress towards PT goals: Progressing toward goals    Frequency    Min 4X/week      PT Plan Current plan remains appropriate    Co-evaluation              AM-PAC PT "6 Clicks" Mobility  Outcome Measure  Help needed turning from your back to your side while in a flat bed without using bedrails?: None Help needed moving from lying on your back to sitting on the side of a flat bed without using bedrails?: None Help needed moving to and from a bed to a chair (including a wheelchair)?: None Help needed standing up from a chair using your arms (e.g., wheelchair or bedside chair)?: None Help needed to walk in hospital room?: None Help needed climbing 3-5 steps with a railing? : A Little 6 Click Score: 23    End of Session   Activity Tolerance: Patient tolerated treatment  well Patient left: in bed;with call bell/phone within reach Nurse Communication: Mobility status PT Visit Diagnosis: Other abnormalities of gait and mobility (R26.89);Unsteadiness on feet (R26.81);Difficulty in walking, not elsewhere classified (R26.2)     Time: 1330-1400 PT Time Calculation (min) (ACUTE ONLY): 30 min  Charges:  $Gait Training: 8-22 mins $Neuromuscular Re-education: 8-22 mins                     Lauren Reyes, PTA Acute Rehabilitation Services Pager: [phone] Office: [phone]     Carolynne Edouard 04/21/2019, 2:17 PM

## 2019-04-21 NOTE — Progress Notes (Signed)
PROGRESS NOTE  Lauren Reyes ZMO:[id number] DOB: 1990/04/23 DOA: 04/01/2019 PCP: Patient, No Pcp Per  Brief History   Lauren Reyes a 29 y.o.femalewith unknown past medical history, possible heroin abuse presents to the emergency department as a code stroke with aphasia and right-sided weakness.  [place] PD called to the hotel room where she was staying as she had overstated her visit,patient was  Found by PD talking on the phone and suddenly started to slur her speech, the right facial droop and had difficulty talking. When EMS arrived patient still communicating but slowly progressively got worse.   Patient was admitted to neurology ICU intubated after  Thrombectomy She underwent thrombectomy for Left MCA m3 occlusion with recanalization complicated by dissection/pseudoanerusym of left ICA s/p stent. She was extubated on March 27.   This morning nursing communicated that the patient had increased heart rate and respiratory rate. This has been an issue for many days. EKG demonstrated only sinus tachycardia. This is assumed to be due to deconditioning or withdrawal, probably from benzodiazepines for which the patient was positive on admission. Ativan has been made available on as needed basis and she has been prescribed scheduled Buspar. This seems to have helped her anxiety.  CTS has been consulted and has stated that the patient is not currently a candidate for surgery. However, they do state that surgery must be considered if the patient deteriorates acutely. Infectious disease is consulted. They have recommended continuing Vancomycin and ceftriaxone for a total of 6 weeks. The last dose is to be given on 05/17/2019. IV antibiotics must be given in a supervised setting due to history of polysubstance abuse and IVDU.  This morning the patient is tachypneic and short of breath. D Dimer has been ordered and was elevated at 3.55. CTA of chest has been ordered to rule out  pulmonary embolus. No pulmonary embolus was seen. However, the patient's CTA did demonstrate mild  progression of cavitary bronchopneumonia with nodules and septic pulmonary emboli . There was also some mild pulmonary edema with new moderate sized layering bilateral pleural effusion with new compressive atelectasis.  Cardiology had signed off as there was no medical therapy other than continued antibiotics to address the patient's multivalvular endocarditis. However CTS has now been called back. They tentatively have decided to go forward with plans for AVR and possible TV repair/replacement early next week. Her brillinta has been stopped for this reason. The patient will need to have a plan in place for substance abuse rehab as outpatient before she goes to surgery.  Consultants  . PCCM . Gastroenterology . Cardiothoracic Surgery . Infectious disease.  Procedures  . TEE  Antibiotics   Anti-infectives (From admission, onward)   Start     Dose/Rate Route Frequency Ordered Stop   04/15/19 0900  vancomycin (VANCOREADY) IVPB 750 mg/150 mL     750 mg 150 mL/hr over 60 Minutes Intravenous Every 8 hours 04/15/19 0821     04/13/19 1400  vancomycin (VANCOCIN) IVPB 1000 mg/200 mL premix  Status:  Discontinued     1,000 mg 200 mL/hr over 60 Minutes Intravenous Every 8 hours 04/13/19 0839 04/15/19 0821   04/12/19 2200  vancomycin (VANCOREADY) IVPB 750 mg/150 mL  Status:  Discontinued     750 mg 150 mL/hr over 60 Minutes Intravenous Every 8 hours 04/12/19 1500 04/13/19 0839   04/10/19 1336  vancomycin (VANCOCIN) IVPB 1000 mg/200 mL premix  Status:  Discontinued     1,000 mg 200 mL/hr over 60 Minutes  Intravenous Every 8 hours 04/10/19 1320 04/12/19 1500   04/08/19 1200  cefTRIAXone (ROCEPHIN) 2 g in sodium chloride 0.9 % 100 mL IVPB     2 g 200 mL/hr over 30 Minutes Intravenous Every 12 hours 04/08/19 1013 05/17/19 2359   04/08/19 0530  vancomycin (VANCOCIN) IVPB 1000 mg/200 mL premix  Status:   Discontinued     1,000 mg 200 mL/hr over 60 Minutes Intravenous Every 12 hours 04/07/19 1541 04/10/19 1320   04/07/19 1900  Ampicillin-Sulbactam (UNASYN) 3 g in sodium chloride 0.9 % 100 mL IVPB  Status:  Discontinued     3 g 200 mL/hr over 30 Minutes Intravenous Every 6 hours 04/07/19 1457 04/08/19 1013   04/07/19 1730  vancomycin (VANCOREADY) IVPB 1500 mg/300 mL     1,500 mg 150 mL/hr over 120 Minutes Intravenous  Once 04/07/19 1541 04/07/19 1937   04/04/19 1400  ceFAZolin (ANCEF) IVPB 2g/100 mL premix  Status:  Discontinued     2 g 200 mL/hr over 30 Minutes Intravenous Every 8 hours 04/04/19 1119 04/07/19 1457   04/04/19 0400  Vancomycin (VANCOCIN) 1,250 mg in sodium chloride 0.9 % 250 mL IVPB  Status:  Discontinued     1,250 mg 166.7 mL/hr over 90 Minutes Intravenous Every 12 hours 04/03/19 1635 04/04/19 1159   04/02/19 1800  ceFEPIme (MAXIPIME) 2 g in sodium chloride 0.9 % 100 mL IVPB  Status:  Discontinued     2 g 200 mL/hr over 30 Minutes Intravenous Every 8 hours 04/02/19 1218 04/04/19 1127   04/02/19 1600  vancomycin (VANCOCIN) IVPB 1000 mg/200 mL premix  Status:  Discontinued     1,000 mg 200 mL/hr over 60 Minutes Intravenous Every 12 hours 04/02/19 0100 04/03/19 1635   04/02/19 0200  piperacillin-tazobactam (ZOSYN) IVPB 3.375 g  Status:  Discontinued     3.375 g 12.5 mL/hr over 240 Minutes Intravenous Every 8 hours 04/02/19 0059 04/02/19 1219   04/02/19 0200  vancomycin (VANCOCIN) 1,500 mg in sodium chloride 0.9 % 500 mL IVPB     1,500 mg 250 mL/hr over 120 Minutes Intravenous  Once 04/02/19 0100 04/02/19 0923   04/01/19 2040  ceFAZolin (ANCEF) 2-4 GM/100ML-% IVPB    Note to Pharmacy: [name]   : cabinet override      04/01/19 2040 04/02/19 0844      Subjective  The patient is seen before she goes down for her "pre-cabg" evaluation.  Objective   Vitals:  Vitals:   04/21/19 0924 04/21/19 1132  BP:  113/60  Pulse:  (!) 108  Resp: 18 20  Temp:  98.5 F (36.9  C)  SpO2:  99%   Exam:  Constitutional:  . The patient is awake, alert, and oriented x 3. No acute distress. Respiratory:  . Positive for conversational dyspnea and tachypnea. . No wheezes, rales, or rhonchi . No tactile fremitus Cardiovascular:  . Regular rate and rhythm . No murmurs, ectopy, or gallups. . No lateral PMI. No thrills. Abdomen:  . Abdomen is soft, non-tender, non-distended . No hernias, masses, or organomegaly . Normoactive bowel sounds.  Musculoskeletal:  . No cyanosis, clubbing, or edema Skin:  . No rashes, lesions, ulcers . palpation of skin: no induration or nodules Neurologic:  . No neurological deficits on exam. Psychiatric:  Pt appears more calm.   I have personally reviewed the following:   Today's Data  . Orthoptist  .  Culture Tracheal aspirate - MSSA  Imaging  . CT head .  CT abdomen and pelvis . CXR; no acute pathology . CTA chest: pending  Cardiology Data  . EKG  Scheduled Meds: . aspirin  81 mg Oral Daily  . atorvastatin  80 mg Per Tube q1800  . busPIRone  7.5 mg Oral BID  . chlorhexidine gluconate (MEDLINE KIT)  15 mL Mouth Rinse BID  . Chlorhexidine Gluconate Cloth  6 each Topical Daily  . escitalopram  10 mg Oral Daily  . furosemide  20 mg Intravenous Q12H  . metoprolol tartrate  12.5 mg Oral BID  . pneumococcal 23 valent vaccine  0.5 mL Intramuscular Tomorrow-1000  . potassium chloride  20 mEq Oral Daily  . QUEtiapine  25 mg Oral QHS  . sodium chloride flush  10-40 mL Intracatheter Q12H  . vitamin B-12  1,000 mcg Oral Daily   Continuous Infusions: . sodium chloride 10 mL/hr at 04/15/19 2255  . cefTRIAXone (ROCEPHIN)  IV Stopped (04/21/19 1012)  . 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  Septic emboli have been seen in left hemisphere of brain, in  lung, spleen with possible splenic and kidney infarcts. Initially the patient was treated with Vancomycin and Unasyn. She is currently receiving Vancomycin and Rocephin. Her fever has resolved. Per infectious disease the patient will require 6 weeks total of this regimen with her last dose being 05/17/2019. WBC has declined from 29+ to 13.5 today. The patient has been evaluated by thoracic surgery, Dr. Servando Snare: patient eventually will require aortic valve replacement, however this is complicated by recent stroke and recent stent on aspirin and Brilinta, as well as a recent decline in mental status.  Per Dr. Pia Mau, surgery should be reconsidered should the patient developed worsening of heart failure syndrome and she has to be off Brilinta for 7 days. However, the patient was re-evaluated by CTS today. They have stopped her Brillinta and plan for AVR and possible TV repair or replacement early next week. Cardiology has signed off. Other than antibiotics, there is no medical management available to address the patient's severe multi valvular endocarditis.  Dyspnea: Improved. CTS demonstrated pulmonary edema and pleural effusions with compressive atelectasis. The patient has been started on lasix. CT chest also demonstrated mild  progression of cavitary bronchopneumonia with nodules and septic pulmonary emboli . Patient denies shortness of breath. No PE was seen on CTA chest.  She is currently saturating in the mid nineties on room air. Monitor volume  status.  CVA: The patient has had a small infarct of left MCA territory. TPA was deferred as concern for septic emboli. The patient underwent a leftcommon carotid arteriogram followed by complete revascularization of occluded M3 region of inferior division ofleft MCA . She is also s/p placement of a 4.5 mm x 85m pipeline flow diverter at site of focal dissection associated with a small pseudoaneurysm of distal cervical LT  ICA. She has been continued on ASA and Brillinta. A1c is 5.9, VLDL was 52. She is on lipitor 80 mg daily.  Dyspnea: CXR - no acute changes. D Dimer: elevated at 3.55. CTA chest: pending.  Hypokalemia: Resolved. Monitor.  Dyslipidemia: With low HDL less than 10, high VLDL at 52, high triglyceride at 258. She is on Lipitor.  Sinus tachycardia/ Tachypnea: Drug withdrawal vs deconditioning. The patient has been started on as needed ativan.TSH 0.8. She is also receiving scheduled low-dose Lopressor with holding parameters. Cardiology has advised caution with any increase in dose of beta blocker due to the patient's severe AR.  Right common femoral vein DVT: Not a candidate for anticoagulation due to large right groin hematoma post procedure and endocarditis. She has undergone placement of IVC filter on March 26 by IR.  Large right groin hematoma post procedure: Pseudoaneurysm addressed with S/P 69F angioseal for hemostasisat therightCFA access site. CBC stable with hemoglobin of 9.9.  Blood loss anemia, likely from right groin hematoma. Status post PRBC transfusion x2 on March 27. GI Dr Penelope Coop consulted on March 27 who do not think patient has any signs of GI bleed. Monitor CBC. Currently stable at 9.9.  B12 deficiency: Supplement.  Hyponatremia: Present on admission. Now resolved. Monitor.  Hepatitis C:  Antibody positive, viral load negative. HIV screening negative RPR screening negative  Polysubstance abuse: UDS on presentation positive for THC amphetamine.  Suspect IV drug use with heroin, multiple needle marks bilateral upper extremities. Patient state IV drug use ongoing for many years now she is waiting to quit. Positive for benzodiazepines on 04/12/2019. Pt must complete her IV antibiotics in a supervised setting for this reason. She will need to have a plan in place for aubstance abuse rehab as outpatient before she goes to surgery.  Anxiety versus drug withdrawal:  Positive for benzodiazepines on admission. She has an episode of crying, acting erratic on [place] 4 a.m. Appears to be responding to as needed Ativan. UDS positive for benzodiazepines on 04/12/2019. I have also started scheduled Buspar.   4:47 PM on 04/12/2019 RN report patient become more confused with increased slurred speech Stat CT head demonstrated evolving areas of recent infarction better seen on prior MRI. Petechial hemorrhage is not visible. There is no discrete hematoma. No acute interval changes.   Tachycardia: Due to deconditioning, vs response to acute illness vs CMO due to polysubstance abuse  Debility: Per PT she needs assistant for bed mobility and transfers. She needs supervision by one person hand-held for ambulation and gait. She was able to ambulate 80 feet on Amiya 2nd. Gait Pattern/deviations: Step-through pattern;Decreased stride length;Wide base of support;Trunk flexed. General Gait Details: Mild unsteadiness, pt reaching for furniture required HHA to improve balance. HR 110 bpm at it's highest.   The patient has been seen and examined by me. I have spent 30 minutes in her evaluation and care.  DVT Prophylaxis:scd's Code Status: full Family Communication: none present Disposition Plan: patient is from home. Discharge SNF vs home. Barriers to discharge. The patient will need to complete her 6 weeks of IV Vancomycin and Rocephin as inpatient for safety reasons due to history of IVDU. Her last dose will be on 05/17/2019. She has infective endocarditis and septic emboli  due to IV drug abuse. She also has no insurance although she may be able to get Medicaid. The patient has been evaluated again by CTS. They plan to take the patient for surgery early next week. The patient needs referral to  rehab before she can go to surgery.  Bijon Mineer, DO [facility] Direct contact: see [email or web address]  7PM-7AM contact night coverage as above 04/21/2019, 1:20 PM  LOS: 12 days

## 2019-04-21 NOTE — Progress Notes (Addendum)
      [address]       [place] [address]             [phone]      14 Days Post-Op Procedure(s) (LRB): TRANSESOPHAGEAL ECHOCARDIOGRAM (TEE) (N/A) BUBBLE STUDY Subjective:  Good conversation about her support system this morning. Her mother is a Education officer, environmental and all her family is very religious. Her boyfriend is clean and has a Financial planner for drug use. She is determined to stay clean after her hospital stay.   Objective: Vital signs in last 24 hours: Temp:  [98.1 F (36.7 C)-98.7 F (37.1 C)] 98.3 F (36.8 C) (04/13 0729) Pulse Rate:  [103-117] 110 (04/13 0729) Cardiac Rhythm: Sinus tachycardia (04/13 0724) Resp:  [17-20] 18 (04/13 0924) BP: (112-142)/(45-58) 140/54 (04/13 0729) SpO2:  [95 %-100 %] 99 % (04/13 0729)     Intake/Output from previous day: 04/12 0701 - 04/13 0700 In: 1521 [P.O.:1080; I.V.:40; IV Piggyback:401] Out: 600 [Urine:600] Intake/Output this shift: No intake/output data recorded.  General appearance: alert, cooperative and no distress Heart: sinus tachycardia Lungs: clear to auscultation bilaterally Abdomen: soft, non-tender; bowel sounds normal; no masses,  no organomegaly Extremities: extremities normal, atraumatic, no cyanosis or edema Wound: n/a  Lab Results: Recent Labs    04/20/19 0843  WBC 4.3  HGB 11.3*  HCT 36.1  PLT 466*   BMET:  Recent Labs    04/20/19 0843  NA 139  K 4.4  CL 106  CO2 22  GLUCOSE 86  BUN 7  CREATININE 0.65  CALCIUM 9.0    PT/INR: No results for input(s): LABPROT, INR in the last 72 hours. ABG    Component Value Date/Time   PHART 7.347 (L) 04/02/2019 0032   HCO3 30.5 (H) 04/02/2019 0032   TCO2 32 04/02/2019 0032   O2SAT 100.0 04/02/2019 0032   CBG (last 3)  No results for input(s): GLUCAP in the last 72 hours.  Assessment/Plan: S/P Procedure(s) (LRB): TRANSESOPHAGEAL ECHOCARDIOGRAM (TEE) (N/A) BUBBLE STUDY  1. CV-BP well controlled. Remains in sinus  tachycardia at times. Appreciate cardiology following along.  2. Pulm-tolerating room air with good oxygen saturation. CT scan of the chest reviewed.  3. Renal-creatinine 0.65, electrolytes ok 4. H and H stable 5. Neuro-Hx of ischemic left MCA stroke, working with SLP. Brilinta stopped for now due to upcoming surgery.  6. Polysubstance abuse, detoxing while in the hospital. 7. IV Vancomycin and Rocephin for 6 weeks. Last dose 05/17/2019 8. Hx of panic attacks and anxiety. On Buspar and Lexapro. Ativan PRN.   Plan: Good discussion about the plan after the hospital and how she intends to stay "clean". Another TOC consult placed to assist with potential rehab placement and resources needed for home. She is not interested in starting methadone or suboxone since it would be "trading one drug for another". Supported her in this decision as long as she insists she will be successful without. She seems like she has a good support system at home.    LOS: 20 days    Lauren Reyes 04/21/2019  I have seen and examined Lauren Reyes and agree with the above assessment  and plan.  Delight Ovens MD Beeper [phone] Office [phone] 04/21/2019 4:13 PM

## 2019-04-21 NOTE — Progress Notes (Signed)
Pre cabg has been completed.   Preliminary results in CV Proc.   Blanch Media 04/21/2019 10:58 AM

## 2019-04-21 NOTE — Plan of Care (Signed)
  Problem: Education: Goal: Knowledge of disease or condition will improve 04/21/2019 1129 by Laurence Slate, RN Outcome: Progressing 04/21/2019 1129 by Laurence Slate, RN Outcome: Progressing Goal: Knowledge of secondary prevention will improve 04/21/2019 1129 by Laurence Slate, RN Outcome: Progressing 04/21/2019 1129 by Laurence Slate, RN Outcome: Progressing Goal: Knowledge of patient specific risk factors addressed and post discharge goals established will improve Outcome: Progressing   Problem: Coping: Goal: Will verbalize positive feelings about self Outcome: Progressing Note: Pt verbalizes having a good support system to get her through this time Goal: Will identify appropriate support needs Outcome: Progressing   Problem: Health Behavior/Discharge Planning: Goal: Ability to manage health-related needs will improve Outcome: Progressing   Problem: Self-Care: Goal: Ability to participate in self-care as condition permits will improve Outcome: Progressing Goal: Verbalization of feelings and concerns over difficulty with self-care will improve Outcome: Progressing Goal: Ability to communicate needs accurately will improve Outcome: Progressing   Problem: Nutrition: Goal: Risk of aspiration will decrease Outcome: Progressing   Problem: Ischemic Stroke/TIA Tissue Perfusion: Goal: Complications of ischemic stroke/TIA will be minimized Outcome: Progressing   Problem: Education: Goal: Knowledge of General Education information will improve Description: Including pain rating scale, medication(s)/side effects and non-pharmacologic comfort measures Outcome: Progressing   Problem: Health Behavior/Discharge Planning: Goal: Ability to manage health-related needs will improve Outcome: Progressing   Problem: Clinical Measurements: Goal: Ability to maintain clinical measurements within normal limits will improve Outcome: Progressing Goal: Will remain free from infection Outcome:  Progressing Goal: Diagnostic test results will improve Outcome: Progressing Goal: Respiratory complications will improve Outcome: Progressing Goal: Cardiovascular complication will be avoided Outcome: Progressing   Problem: Activity: Goal: Risk for activity intolerance will decrease Outcome: Progressing   Problem: Nutrition: Goal: Adequate nutrition will be maintained Outcome: Progressing   Problem: Coping: Goal: Level of anxiety will decrease Outcome: Progressing   Problem: Elimination: Goal: Will not experience complications related to bowel motility Outcome: Progressing Goal: Will not experience complications related to urinary retention Outcome: Progressing   Problem: Pain Managment: Goal: General experience of comfort will improve Outcome: Progressing   Problem: Safety: Goal: Ability to remain free from injury will improve Outcome: Progressing   Problem: Skin Integrity: Goal: Risk for impaired skin integrity will decrease Outcome: Progressing

## 2019-04-22 LAB — BASIC METABOLIC PANEL
Anion gap: 10 (ref 5–15)
BUN: 9 mg/dL (ref 6–20)
CO2: 22 mmol/L (ref 22–32)
Calcium: 8.5 mg/dL — ABNORMAL LOW (ref 8.9–10.3)
Chloride: 106 mmol/L (ref 98–111)
Creatinine, Ser: 0.62 mg/dL (ref 0.44–1.00)
GFR calc Af Amer: >60 mL/min (ref >60)
GFR calc non Af Amer: >60 mL/min (ref >60)
Glucose, Bld: 121 mg/dL — ABNORMAL HIGH (ref 70–99)
Potassium: 3.7 mmol/L (ref 3.5–5.1)
Sodium: 138 mmol/L (ref 135–145)

## 2019-04-22 LAB — CBC
HCT: 35 % — ABNORMAL LOW (ref 36.0–46.0)
Hemoglobin: 11 g/dL — ABNORMAL LOW (ref 12.0–15.0)
MCH: 27.4 pg (ref 26.0–34.0)
MCHC: 31.4 g/dL (ref 30.0–36.0)
MCV: 87.3 fL (ref 80.0–100.0)
Platelets: 347 10*3/uL (ref 150–400)
RBC: 4.01 MIL/uL (ref 3.87–5.11)
RDW: 19.6 % — ABNORMAL HIGH (ref 11.5–15.5)
WBC: 3.4 10*3/uL — ABNORMAL LOW (ref 4.0–10.5)
nRBC: 0 % (ref 0.0–0.2)

## 2019-04-22 NOTE — Progress Notes (Signed)
  Speech Language Pathology Treatment: Cognitive-Linquistic  Patient Details Name: Lauren Reyes MRN: [id number] DOB: 1990/12/01 Today's Date: 04/22/2019 Time: 2130-8657 SLP Time Calculation (min) (ACUTE ONLY): 15 min  Assessment / Plan / Recommendation Clinical Impression  Skilled treatment session targeted pt's speech intelligibility goals. SLP facilitated session by providing supervision level cues to increase speech intelligibility at the simple conversation level to ~ 90%. Pt demonstrated good recall of speech intelligibility strategies. Pt also stated that it was difficult to generate words as well as spelling the words correctly. SLP viewed pt's writing. It did contain errors in word finding and spelling. Would recommend further assessment to promote functional independence.    HPI HPI: Pt is a 29 yo female with suspected IVDU admitted with sudden onset R sided weakness and aphasia, now s/p revascularization by IR of L MCA. MRI showed numerous smaller acute infarcts scattered throughout the brain bilaterally. ETT 3/24-3/27.      SLP Plan  Continue with current plan of care       Recommendations   N/A                Follow up Recommendations: Outpatient SLP SLP Visit Diagnosis: Dysarthria and anarthria (R47.1) Plan: Continue with current plan of care       GO                Lemon Whitacre 04/22/2019, 2:37 PM

## 2019-04-22 NOTE — Progress Notes (Signed)
PROGRESS NOTE  Lauren Reyes ZOX:[id number] DOB: 21-Jul-1990 DOA: 04/01/2019 PCP: Patient, No Pcp Per  Brief History   Lauren Reyes a 29 y.o.femalewith unknown past medical history, possible heroin abuse presents to the emergency department as a code stroke with aphasia and right-sided weakness.  [place] PD called to the hotel room where she was staying as she had overstated her visit,patient was  Found by PD talking on the phone and suddenly started to slur her speech, the right facial droop and had difficulty talking. When EMS arrived patient still communicating but slowly progressively got worse.   Patient was admitted to neurology ICU intubated after  Thrombectomy She underwent thrombectomy for Left MCA m3 occlusion with recanalization complicated by dissection/pseudoanerusym of left ICA s/p stent. She was extubated on March 27.   This morning nursing communicated that the patient had increased heart rate and respiratory rate. This has been an issue for many days. EKG demonstrated only sinus tachycardia. This is assumed to be due to deconditioning or withdrawal, probably from benzodiazepines for which the patient was positive on admission. Ativan has been made available on as needed basis and she has been prescribed scheduled Buspar. This seems to have helped her anxiety.  CTS has been consulted and has stated that the patient is not currently a candidate for surgery. However, they do state that surgery must be considered if the patient deteriorates acutely. Infectious disease is consulted. They have recommended continuing Vancomycin and ceftriaxone for a total of 6 weeks. The last dose is to be given on 05/17/2019. IV antibiotics must be given in a supervised setting due to history of polysubstance abuse and IVDU.  This morning the patient is tachypneic and short of breath. D Dimer has been ordered and was elevated at 3.55. CTA of chest has been ordered to rule out  pulmonary embolus. No pulmonary embolus was seen. However, the patient's CTA did demonstrate mild  progression of cavitary bronchopneumonia with nodules and septic pulmonary emboli . There was also some mild pulmonary edema with new moderate sized layering bilateral pleural effusion with new compressive atelectasis.  Cardiology had signed off as there was no medical therapy other than continued antibiotics to address the patient's multivalvular endocarditis. However CTS has now been called back. They tentatively have decided to go forward with plans for AVR and possible TV repair/replacement early next week. Her brillinta has been stopped for this reason. The patient will need to have a plan in place for substance abuse rehab as outpatient before she goes to surgery.  Consultants  . PCCM . Gastroenterology . Cardiothoracic Surgery . Infectious disease.  Procedures  . TEE  Antibiotics   Anti-infectives (From admission, onward)   Start     Dose/Rate Route Frequency Ordered Stop   04/15/19 0900  vancomycin (VANCOREADY) IVPB 750 mg/150 mL     750 mg 150 mL/hr over 60 Minutes Intravenous Every 8 hours 04/15/19 0821     04/13/19 1400  vancomycin (VANCOCIN) IVPB 1000 mg/200 mL premix  Status:  Discontinued     1,000 mg 200 mL/hr over 60 Minutes Intravenous Every 8 hours 04/13/19 0839 04/15/19 0821   04/12/19 2200  vancomycin (VANCOREADY) IVPB 750 mg/150 mL  Status:  Discontinued     750 mg 150 mL/hr over 60 Minutes Intravenous Every 8 hours 04/12/19 1500 04/13/19 0839   04/10/19 1336  vancomycin (VANCOCIN) IVPB 1000 mg/200 mL premix  Status:  Discontinued     1,000 mg 200 mL/hr over 60 Minutes  Intravenous Every 8 hours 04/10/19 1320 04/12/19 1500   04/08/19 1200  cefTRIAXone (ROCEPHIN) 2 g in sodium chloride 0.9 % 100 mL IVPB     2 g 200 mL/hr over 30 Minutes Intravenous Every 12 hours 04/08/19 1013 05/17/19 2359   04/08/19 0530  vancomycin (VANCOCIN) IVPB 1000 mg/200 mL premix  Status:   Discontinued     1,000 mg 200 mL/hr over 60 Minutes Intravenous Every 12 hours 04/07/19 1541 04/10/19 1320   04/07/19 1900  Ampicillin-Sulbactam (UNASYN) 3 g in sodium chloride 0.9 % 100 mL IVPB  Status:  Discontinued     3 g 200 mL/hr over 30 Minutes Intravenous Every 6 hours 04/07/19 1457 04/08/19 1013   04/07/19 1730  vancomycin (VANCOREADY) IVPB 1500 mg/300 mL     1,500 mg 150 mL/hr over 120 Minutes Intravenous  Once 04/07/19 1541 04/07/19 1937   04/04/19 1400  ceFAZolin (ANCEF) IVPB 2g/100 mL premix  Status:  Discontinued     2 g 200 mL/hr over 30 Minutes Intravenous Every 8 hours 04/04/19 1119 04/07/19 1457   04/04/19 0400  Vancomycin (VANCOCIN) 1,250 mg in sodium chloride 0.9 % 250 mL IVPB  Status:  Discontinued     1,250 mg 166.7 mL/hr over 90 Minutes Intravenous Every 12 hours 04/03/19 1635 04/04/19 1159   04/02/19 1800  ceFEPIme (MAXIPIME) 2 g in sodium chloride 0.9 % 100 mL IVPB  Status:  Discontinued     2 g 200 mL/hr over 30 Minutes Intravenous Every 8 hours 04/02/19 1218 04/04/19 1127   04/02/19 1600  vancomycin (VANCOCIN) IVPB 1000 mg/200 mL premix  Status:  Discontinued     1,000 mg 200 mL/hr over 60 Minutes Intravenous Every 12 hours 04/02/19 0100 04/03/19 1635   04/02/19 0200  piperacillin-tazobactam (ZOSYN) IVPB 3.375 g  Status:  Discontinued     3.375 g 12.5 mL/hr over 240 Minutes Intravenous Every 8 hours 04/02/19 0059 04/02/19 1219   04/02/19 0200  vancomycin (VANCOCIN) 1,500 mg in sodium chloride 0.9 % 500 mL IVPB     1,500 mg 250 mL/hr over 120 Minutes Intravenous  Once 04/02/19 0100 04/02/19 0923   04/01/19 2040  ceFAZolin (ANCEF) 2-4 GM/100ML-% IVPB    Note to Pharmacy: [name]   : cabinet override      04/01/19 2040 04/02/19 0844      Subjective  The patient is resting comfortably in bed. No new complaints.  Objective   Vitals:  Vitals:   04/22/19 0843 04/22/19 1209  BP: (!) 121/56 (!) 114/44  Pulse: (!) 102 (!) 102  Resp: 20 20  Temp: 97.8  F (36.6 C) 97.8 F (36.6 C)  SpO2: 98% 99%   Exam:  Constitutional:  . The patient is awake, alert, and oriented x 3. No acute distress. Respiratory:  . Positive for conversational dyspnea and tachypnea. . No wheezes, rales, or rhonchi . No tactile fremitus Cardiovascular:  . Regular rate and rhythm . No murmurs, ectopy, or gallups. . No lateral PMI. No thrills. Abdomen:  . Abdomen is soft, non-tender, non-distended . No hernias, masses, or organomegaly . Normoactive bowel sounds.  Musculoskeletal:  . No cyanosis, clubbing, or edema Skin:  . No rashes, lesions, ulcers . palpation of skin: no induration or nodules Neurologic:  . No neurological deficits on exam. Psychiatric:  Pt appears more calm.   I have personally reviewed the following:   Today's Data  . Vitals, CBC, BMP  Micro Data  .  Culture Tracheal aspirate - MSSA  Imaging  . CT head . CT abdomen and pelvis . CXR; no acute pathology . CTA chest: pending  Cardiology Data  . EKG  Scheduled Meds: . aspirin  81 mg Oral Daily  . atorvastatin  80 mg Per Tube q1800  . busPIRone  7.5 mg Oral BID  . chlorhexidine gluconate (MEDLINE KIT)  15 mL Mouth Rinse BID  . Chlorhexidine Gluconate Cloth  6 each Topical Daily  . escitalopram  10 mg Oral Daily  . furosemide  20 mg Intravenous Q12H  . metoprolol tartrate  12.5 mg Oral BID  . pneumococcal 23 valent vaccine  0.5 mL Intramuscular Tomorrow-1000  . potassium chloride  20 mEq Oral Daily  . QUEtiapine  25 mg Oral QHS  . sodium chloride flush  10-40 mL Intracatheter Q12H  . vitamin B-12  1,000 mcg Oral Daily   Continuous Infusions: . sodium chloride 10 mL/hr at 04/15/19 2255  . cefTRIAXone (ROCEPHIN)  IV 2 g (04/22/19 1002)  . 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  Septic emboli have been seen in left hemisphere of brain, in  lung, spleen with possible splenic and kidney infarcts. Initially the patient was treated with Vancomycin and Unasyn. She is currently receiving Vancomycin and Rocephin. Her fever has resolved. Per infectious disease the patient will require 6 weeks total of this regimen with her last dose being 05/17/2019. WBC has declined from 29+ to 13.5 today. The patient has been evaluated by thoracic surgery, Dr. Servando Snare: patient eventually will require aortic valve replacement, however this is complicated by recent stroke and recent stent on aspirin and Brilinta, as well as a recent decline in mental status.  Per Dr. Pia Mau, surgery should be reconsidered should the patient developed worsening of heart failure syndrome and she has to be off Brilinta for 7 days. However, the patient was re-evaluated by CTS today. They have stopped her Brillinta and plan for AVR and possible TV repair or replacement early next week. Cardiology has signed off. Other than antibiotics, there is no medical management available to address the patient's severe multi valvular endocarditis.  Dyspnea: Improved. CTS demonstrated pulmonary edema and pleural effusions with compressive atelectasis. The patient has been started on lasix. CT chest also demonstrated mild  progression of cavitary bronchopneumonia with nodules and septic pulmonary emboli . Patient denies shortness of breath. No PE was seen on CTA chest.  She is currently saturating in the mid  nineties on room air. Monitor volume status.  CVA: The patient has had a small infarct of left MCA territory. TPA was deferred as concern for septic emboli. The patient underwent a leftcommon carotid arteriogram followed by complete revascularization of occluded M3 region of inferior division ofleft MCA . She is also s/p placement of a 4.5 mm x 23m pipeline flow diverter at site of focal dissection associated  with a small pseudoaneurysm of distal cervical LT ICA. She has been continued on ASA and Brillinta. A1c is 5.9, VLDL was 52. She is on lipitor 80 mg daily.  Dyspnea: CXR - no acute changes. D Dimer: elevated at 3.55. CTA chest: pending.  Hypokalemia: Resolved. Monitor.  Dyslipidemia: With low HDL less than 10, high VLDL at 52, high triglyceride at 258. She is on Lipitor.  Sinus tachycardia/ Tachypnea: Drug withdrawal vs deconditioning. The patient has been started on as needed ativan.TSH 0.8. She is also receiving scheduled low-dose Lopressor with holding parameters. Cardiology has advised caution with any increase in dose of beta blocker due to the patient's severe AR.  Right common femoral vein DVT: Not a candidate for anticoagulation due to large right groin hematoma post procedure and endocarditis. She has undergone placement of IVC filter on March 26 by IR.  Large right groin hematoma post procedure: Pseudoaneurysm addressed with S/P 4F angioseal for hemostasisat therightCFA access site. CBC stable with hemoglobin of 9.9.  Blood loss anemia, likely from right groin hematoma. Status post PRBC transfusion x2 on March 27. GI Dr Penelope Coop consulted on March 27 who do not think patient has any signs of GI bleed. Monitor CBC. Currently stable at 9.9.  B12 deficiency: Supplement.  Hyponatremia: Present on admission. Now resolved. Monitor.  Hepatitis C:  Antibody positive, viral load negative. HIV screening negative RPR screening negative  Polysubstance abuse: UDS on  presentation positive for THC amphetamine. Suspect IV drug use with heroin, multiple needle marks bilateral upper extremities. Patient state IV drug use ongoing for many years now she is waiting to quit. Positive for benzodiazepines on 04/12/2019. Pt must complete her IV antibiotics in a supervised setting for this reason. She will need to have a plan in place for aubstance abuse rehab as outpatient before she goes to surgery.  Anxiety versus drug withdrawal:  Positive for benzodiazepines on admission. She has an episode of crying, acting erratic on Talma 4 a.m. Appears to be responding to as needed Ativan. UDS positive for benzodiazepines on 04/12/2019. I have also started scheduled Buspar.   4:47 PM on 04/12/2019 RN report patient become more confused with increased slurred speech Stat CT head demonstrated evolving areas of recent infarction better seen on prior MRI. Petechial hemorrhage is not visible. There is no discrete hematoma. No acute interval changes.   Tachycardia: Due to deconditioning, vs response to acute illness vs CMO due to polysubstance abuse  Debility: Per PT she needs assistant for bed mobility and transfers. She needs supervision by one person hand-held for ambulation and gait. She was able to ambulate 80 feet on Kat[place] 2nd. Gait Pattern/deviations: Step-through pattern;Decreased stride length;Wide base of support;Trunk flexed. General Gait Details: Mild unsteadiness, pt reaching for furniture required HHA to improve balance. HR 110 bpm at it's highest.   The patient has been seen and examined by me. I have spent 30 minutes in her evaluation and care.  DVT Prophylaxis:scd's Code Status: full Family Communication: none present Disposition Plan: patient is from home. Discharge SNF vs home. Barriers to discharge. The patient will need to complete her 6 weeks of IV Vancomycin and Rocephin as inpatient for safety reasons due to history of IVDU. Her last dose will be on 05/17/2019. She has  infective endocarditis and septic emboli due to IV drug abuse. She also has no insurance although she may be able to get Medicaid. The patient has been evaluated again by CTS. They plan to take the patient for surgery  early next week. The patient needs referral to rehab before she can go to surgery.  Quanah Majka, DO [facility] Direct contact: see [email or web address]  7PM-7AM contact night coverage as above 04/22/2019, 1:49 PM  LOS: 12 days

## 2019-04-22 NOTE — Progress Notes (Signed)
CARDIAC REHAB PHASE I   PRE:  Rate/Rhythm: 107 ST  BP:  Supine:    Sitting: 114/52  Standing:     SaO2: 99%RA  MODE:  Ambulation: 500 ft   POST:  Rate/Rhythm: 108  BP:  Supine:   Sitting: 121/45  Standing:    SaO2: 97%RA 1405-1505 Pt walked 500 ft with hand held asst. Gait a little wobbly. Gave pt OHS booklet and wrote down how to view pre op video. Gave pt IS and she demonstrated 750 ml. Discussed importance of IS and walking after surgery. Discussed sternal precautions. Pt receptive to education. Tearful at beginning of visit, emotional support given.   Luetta Nutting, RN BSN  04/22/2019 2:59 PM

## 2019-04-22 NOTE — Progress Notes (Signed)
Physical Therapy Treatment Patient Details Name: Lauren Reyes MRN: [id number] DOB: 04-Apr-1990 Today's Date: 04/22/2019    History of Present Illness Pt is 29 year old female who was admitted with suspected IV drug abuse was being interviewed by police and developed right-sided facial weakness, aphasia, and R weakness.  She was brought by EMS to the emergency room work-up revealed occluded left MCA. Revascularization performed by IR. CT head and neck demonstrate multiple small cavitary abscesses in the upper lobes bilaterally. IVC filter placement for RLE DVT on 3/26. Pt on antibiotics for multivalvular endocarditis.  Cardiothoracic surgery have tentative plans to go forward with AVR and possible TV repair/replacement early next week.    PT Comments    Pt demonstrating gradual improvements.  Continues to be able to ambulate without AD and with mild deficits.  DGI score improved to 21/24.  Does have mild decreased safety awareness and requires cues for more complex task. Goals met and updated.  Will continue to advance pt's mobility particularly since plan is for surgery next week.  Will focus on higher level balance activities.   Follow Up Recommendations  Outpatient PT     Equipment Recommendations  None recommended by PT    Recommendations for Other Services       Precautions / Restrictions Precautions Precautions: Fall Precaution Comments: impulsive    Mobility  Bed Mobility Overal bed mobility: Independent                Transfers Overall transfer level: Modified independent   Transfers: Sit to/from Stand Sit to Stand: Supervision         General transfer comment: Pt ambulating in room with family providing supervision  Ambulation/Gait Ambulation/Gait assistance: Supervision Gait Distance (Feet): 500 Feet Assistive device: None Gait Pattern/deviations: Step-through pattern;Drifts right/left     General Gait Details: Supervision for safety; mild  drifting R and L but no LOB; performed dynamic activities including stepping over objects, looking up/down/L/R, and navigating around obstacles; pt reports feels like walking is "almost" back to nofrmal   Stairs Stairs: Yes Stairs assistance: Min guard Stair Management: No rails;Alternating pattern;Step to pattern Number of Stairs: 10 General stair comments: 5 steps with step to pattern and 5 with alternating pattern; cues for safety with lines   Wheelchair Mobility    Modified Rankin (Stroke Patients Only) Modified Rankin (Stroke Patients Only) Pre-Morbid Rankin Score: No symptoms Modified Rankin: Moderate disability     Balance Overall balance assessment: Needs assistance Sitting-balance support: Feet supported Sitting balance-Leahy Scale: Good     Standing balance support: No upper extremity supported Standing balance-Leahy Scale: Good                   Standardized Balance Assessment Standardized Balance Assessment : Dynamic Gait Index   Dynamic Gait Index Level Surface: Mild Impairment Change in Gait Speed: Normal Gait with Horizontal Head Turns: Mild Impairment Gait with Vertical Head Turns: Mild Impairment Gait and Pivot Turn: Normal Step Over Obstacle: Normal Step Around Obstacles: Normal Steps: Normal Total Score: 21      Cognition Arousal/Alertness: Awake/alert Behavior During Therapy: WFL for tasks assessed/performed Overall Cognitive Status: Impaired/Different from baseline Area of Impairment: Safety/judgement;Problem solving                         Safety/Judgement: Decreased awareness of safety;Decreased awareness of deficits   Problem Solving: Difficulty sequencing General Comments: Pt requiring assist to tie gown and put on mask correctly  Exercises      General Comments        Pertinent Vitals/Pain Pain Assessment: No/denies pain    Home Living                      Prior Function            PT  Goals (current goals can now be found in the care plan section) Acute Rehab PT Goals Patient Stated Goal: 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 PT Goal Formulation: With patient/family Time For Goal Achievement: 05/06/19 Potential to Achieve Goals: Good Progress towards PT goals: Progressing toward goals    Frequency    Min 4X/week      PT Plan Discharge plan needs to be updated    Co-evaluation              AM-PAC PT "6 Clicks" Mobility   Outcome Measure  Help needed turning from your back to your side while in a flat bed without using bedrails?: None Help needed moving from lying on your back to sitting on the side of a flat bed without using bedrails?: None Help needed moving to and from a bed to a chair (including a wheelchair)?: None Help needed standing up from a chair using your arms (e.g., wheelchair or bedside chair)?: None Help needed to walk in hospital room?: None Help needed climbing 3-5 steps with a railing? : None 6 Click Score: 24    End of Session Equipment Utilized During Treatment: Gait belt Activity Tolerance: Patient tolerated treatment well Patient left: Other (comment);with family/visitor present(in bathroom (has been getting up with family supervisioN)) Nurse Communication: Mobility status PT Visit Diagnosis: Other abnormalities of gait and mobility (R26.89);Unsteadiness on feet (R26.81);Difficulty in walking, not elsewhere classified (R26.2)     Time: 1730-1750 PT Time Calculation (min) (ACUTE ONLY): 20 min  Charges:  $Gait Training: 8-22 mins                     Maggie Font, PT Acute Rehab Services Pager [phone] [facility] [phone]    Lauren Reyes 04/22/2019, 6:06 PM

## 2019-04-22 NOTE — Progress Notes (Addendum)
      [address]       [place], [address]             [phone]      15 Days Post-Op Procedure(s) (LRB): TRANSESOPHAGEAL ECHOCARDIOGRAM (TEE) (N/A) BUBBLE STUDY Subjective: Feels okay this morning. Anxiety about surgery. Asking me to call her mom and update on the plan. I will do this today.   Objective: Vital signs in last 24 hours: Temp:  [97.7 F (36.5 C)-98.9 F (37.2 C)] 98 F (36.7 C) (04/14 0423) Pulse Rate:  [85-108] 102 (04/14 0423) Cardiac Rhythm: Sinus tachycardia (04/14 0728) Resp:  [18-20] 19 (04/14 0423) BP: (110-128)/(47-60) 128/55 (04/14 0423) SpO2:  [96 %-100 %] 100 % (04/14 0423)   Intake/Output from previous day: 04/13 0701 - 04/14 0700 In: 1210 [P.O.:960; IV Piggyback:250] Out: -  Intake/Output this shift: No intake/output data recorded.  General appearance: alert, cooperative and no distress Heart: sinus tachycardia Lungs: clear to auscultation bilaterally Abdomen: soft, non-tender; bowel sounds normal; no masses,  no organomegaly Extremities: extremities normal, atraumatic, no cyanosis or edema Wound: n/a  Lab Results: Recent Labs    04/20/19 0843  WBC 4.3  HGB 11.3*  HCT 36.1  PLT 466*   BMET:  Recent Labs    04/20/19 0843  NA 139  K 4.4  CL 106  CO2 22  GLUCOSE 86  BUN 7  CREATININE 0.65  CALCIUM 9.0    PT/INR: No results for input(s): LABPROT, INR in the last 72 hours. ABG    Component Value Date/Time   PHART 7.347 (L) 04/02/2019 0032   HCO3 30.5 (H) 04/02/2019 0032   TCO2 32 04/02/2019 0032   O2SAT 100.0 04/02/2019 0032   CBG (last 3)  No results for input(s): GLUCAP in the last 72 hours.  Assessment/Plan: S/P Procedure(s) (LRB): TRANSESOPHAGEAL ECHOCARDIOGRAM (TEE) (N/A) BUBBLE STUDY  1. CV-BP well controlled. Remains in sinus tachycardiaat times. Appreciate cardiology following along.  2. Pulm-tolerating room air with good oxygen saturation. CT scan of the chest reviewed. 3.  Renal-creatinine 0.65,electrolytes ok 4. H and H stable 5. Neuro-Hx of ischemic left MCA stroke, working with SLP. Brilinta stopped for now due to upcoming surgery.  6. Polysubstance abuse, detoxing while in the hospital. 7. IV Vancomycin and Rocephin for 6 weeks. Last dose 05/17/2019 8. Hx of panic attacks and anxiety. On Buspar and Lexapro. Ativan PRN.  Plan: No notes from case management. Two consults placed. Again, there needs to be a plan before she goes to the OR Tuesday.    LOS: 21 days    Lauren Reyes 04/22/2019  Considering Valve (s) replacement Tuesday  Repeat echo tomorrow Discussed with patient and her mother (on phone) Case management working on drug rehba- placement postop   I have seen and examined Lauren Reyes and agree with the above assessment  and plan.  Lauren Ovens MD [facility] [phone] Office [phone] 04/22/2019 12:27 PM

## 2019-04-22 NOTE — TOC Progression Note (Addendum)
Transition of Care [facility]) - Progression Note    Patient Details  Name: Lauren Reyes MRN: [id number] Date of Birth: 09/01/1990  Transition of Care [facility]) CM/SW Contact  Kermit Balo, RN Phone Number: 04/22/2019, 9:13 AM  Clinical Narrative:    CM provided pt with inpatient and outpatient resources for drug counseling. Pt will not be able to schedule any type of counseling until closer to date of d/c. She is going to call using the resources and find a place to attend counseling when she is d/ced from the hospital.  TOC following.  Addendum (1500): Spoke with patient again and she plans on staying with her significant other at d/c. She wants to attend an outpatient drug counseling program in [place] since she will be living in that area. She plans to talk to Yvetta Coder when her mother arrives today since her mother will be covering the cost per pt. TOC to follow up with her in am.        Expected Discharge Plan and Services                                                 Social Determinants of Health (SDOH) Interventions    Readmission Risk Interventions No flowsheet data found.

## 2019-04-22 NOTE — Progress Notes (Signed)
Pharmacy Antibiotic Note  Lauren Reyes is a 29 y.o. female admitted on 04/01/2019 as a code stroke with aphasia and right-sided weakness. Pharmacy has been consulted for vancomycin dosing.  TEE on 3/30 revealed severe endocarditis involving aortic, mitral, and tricuspid valves with severe aortic regurgitation. Patient's condition is further complicated by septic emboli to her brain, lungs, and spleen. Respiratory culture grew MSSA, however, recent blood cultures are negative. Appears to have received at least one dose of cefazolin prior to culture draw, but will plan to treat with vancomycin and ceftriaxone to cover MSSA and other causes of culture-negative endocarditis.  Lauren Reyes is afebrile with WBC 4.3. SCr is stable at 0.65. Last vancomycin trough drawn 4/10 was adjusted to 15 mcg/ml based on timing of the level drawn. Renal function stable. Next trough due on 4/17.    Noted that doses are being adjusted on troughs alone due to concern of cerebritis.   Plan: - Continue Vancomycin at 750 mg IV q 8 hours  - Consider weekly checks while renal function remains stable - Continue ceftriaxone IV 2g q12 per ID - Monitor renal function, clinical improvement, WBC, and temperature - End date for antibiotics: 05/17/19  Height: 5\' 5"  (165.1 cm) Weight: 72.3 kg (159 lb 6.3 oz) IBW/kg (Calculated) : 57  Temp (24hrs), Avg:98.2 F (36.8 C), Min:97.7 F (36.5 C), Max:98.9 F (37.2 C)  Recent Labs  Lab 04/17/19 1012 04/18/19 0610 04/18/19 0749 04/20/19 0843  WBC 6.4 3.5*  --  4.3  CREATININE 0.65 0.63  --  0.65  VANCOTROUGH  --   --  16  --     Estimated Creatinine Clearance: 102.4 mL/min (by C-G formula based on SCr of 0.65 mg/dL).    No Known Allergies  Antimicrobials this admission: Zosyn 3/25 >> 3/25 Cefepime 3/25 >> 3/27 Ancef 3/27 >> 3/30 Vanc 3/25 >> 3/27; restarted 3/30 >> (5/9) Unasyn 3/30>>3/31 Ceftriaxone 3/31>>(5/9)  Microbiology results: 3/24 COVID/Flu >>  neg 3/25 RCx >> moderate MSSA 3/25 MRSA PCR >> negative 3/25 BCx >> negative   Jaxden Blyden A. 4/25, PharmD, BCPS, FNKF Clinical Pharmacist [address] Please utilize Amion for appropriate phone number to reach the unit pharmacist [facility])

## 2019-04-23 ENCOUNTER — Inpatient Hospital Stay (HOSPITAL_COMMUNITY): Payer: Medicaid Other

## 2019-04-23 DIAGNOSIS — I340 Nonrheumatic mitral (valve) insufficiency: Secondary | ICD-10-CM

## 2019-04-23 DIAGNOSIS — I361 Nonrheumatic tricuspid (valve) insufficiency: Secondary | ICD-10-CM

## 2019-04-23 DIAGNOSIS — I351 Nonrheumatic aortic (valve) insufficiency: Secondary | ICD-10-CM

## 2019-04-23 LAB — ECHOCARDIOGRAM COMPLETE
Height: 65 in
Weight: 2550.28 oz

## 2019-04-23 NOTE — Progress Notes (Signed)
Occupational Therapy Treatment Patient Details Name: Lauren Reyes Threat MRN: [id number] DOB: 07/30/1990 Today's Date: 04/23/2019    History of present illness Pt is 29 year old female who was admitted with suspected IV drug abuse was being interviewed by police and developed right-sided facial weakness, aphasia, and R weakness.  She was brought by EMS to the emergency room work-up revealed occluded left MCA. Revascularization performed by IR. CT head and neck demonstrate multiple small cavitary abscesses in the upper lobes bilaterally. IVC filter placement for RLE DVT on 3/26. Pt on antibiotics for multivalvular endocarditis.  Cardiothoracic surgery have tentative plans to go forward with AVR and possible TV repair/replacement early next week.   OT comments  Pt making progress in therapy, demonstrating improved balance, activity tolerance, and independence with self-care and mobility tasks. Educated/instructed pt on safety strategies for item retrieval task to prevent future falls with fair understanding and follow through. Pt tolerated standing and ambulating 1 x 6 min with supervision around room to retrieve items and straighten out bed sheets. Noted 0 instances of LOB, however pt required cues for safety with IV pole. Educated pt on safety strategies with tub/shower transfer and simulated in room. Pt able to complete tub/shower transfer x 2 with min guard, noting 0 instances of LOB. All questions/concerns answered at this time. OT will continue to follow acutely.    Follow Up Recommendations  Home health OT;Supervision - Intermittent    Equipment Recommendations  3 in 1 bedside commode    Recommendations for Other Services      Precautions / Restrictions Precautions Precautions: Fall Precaution Comments: impulsive Restrictions Weight Bearing Restrictions: No       Mobility Bed Mobility                  Transfers Overall transfer level: Needs assistance Equipment used:  None Transfers: Sit to/from Stand;Stand Pivot Transfers Sit to Stand: Supervision Stand pivot transfers: Supervision       General transfer comment: To ensure balance and safety with lines    Balance Overall balance assessment: Needs assistance Sitting-balance support: Feet supported Sitting balance-Leahy Scale: Good       Standing balance-Leahy Scale: Good                             ADL either performed or assessed with clinical judgement   ADL Overall ADL's : Needs assistance/impaired Eating/Feeding: Independent;Sitting   Grooming: Wash/dry hands;Wash/dry face;Supervision/safety;Standing Grooming Details (indicate cue type and reason): While standing at the sink                 Toilet Transfer: Supervision/safety;Ambulation;Regular Toilet   Toileting- Water quality scientist and Hygiene: Supervision/safety;Sit to/from stand   Tub/ Shower Transfer: Teacher, English as a foreign language Details (indicate cue type and reason): Simulated in room Functional mobility during ADLs: Supervision/safety General ADL Comments: Pt able to ambulate around room 1 x 6 min for item retrieval task. Noted 0 instances of LOB, however pt required cues for safety with IV pole.      Vision       Perception     Praxis      Cognition Arousal/Alertness: Awake/alert Behavior During Therapy: WFL for tasks assessed/performed Overall Cognitive Status: Impaired/Different from baseline Area of Impairment: Safety/judgement;Problem solving                         Safety/Judgement: Decreased awareness of safety;Decreased awareness of deficits   Problem  Solving: Decreased initiation;Requires verbal cues General Comments: Pt pleasant and willing to participate in therapy. Pt continues to require cues for safety, especially with IV pole.         Exercises     Shoulder Instructions       General Comments No signs/symptoms of distress    Pertinent Vitals/  Pain       Pain Assessment: No/denies pain  Home Living                                          Prior Functioning/Environment              Frequency           Progress Toward Goals  OT Goals(current goals can now be found in the care plan section)  Progress towards OT goals: Progressing toward goals  ADL Goals Pt Will Perform Grooming: Independently;standing Pt Will Perform Tub/Shower Transfer: with supervision;Tub transfer;grab bars Additional ADL Goal #1: Pt to complete all ADLs including item retrieval independently. Additional ADL Goal #2: Pt to tolerate standing up to 10 min independently, in preparation for ADLs. Additional ADL Goal #3: Pt to recall and verbalize 3 fall prevention strategies with 0 verbal cues.  Plan Discharge plan remains appropriate    Co-evaluation                 AM-PAC OT "6 Clicks" Daily Activity     Outcome Measure   Help from another person eating meals?: None Help from another person taking care of personal grooming?: A Little Help from another person toileting, which includes using toliet, bedpan, or urinal?: A Little Help from another person bathing (including washing, rinsing, drying)?: A Little Help from another person to put on and taking off regular upper body clothing?: A Little Help from another person to put on and taking off regular lower body clothing?: A Little 6 Click Score: 19    End of Session Equipment Utilized During Treatment: Gait belt  OT Visit Diagnosis: Unsteadiness on feet (R26.81);Muscle weakness (generalized) (M62.81);Cognitive communication deficit (R41.841) Symptoms and signs involving cognitive functions: Cerebral infarction   Activity Tolerance Patient tolerated treatment well   Patient Left Other (comment)(Pt seated EOB, setup for breakfast.)   Nurse Communication Mobility status        Time: 1610-9604 OT Time Calculation (min): 16 min  Charges: OT General  Charges $OT Visit: 1 Visit OT Treatments $Therapeutic Activity: 8-22 mins  Peterson Ao OTR/L [phone]   Peterson Ao 04/23/2019, 10:09 AM

## 2019-04-23 NOTE — Progress Notes (Signed)
  Echocardiogram 2D Echocardiogram has been performed.  Lauren Reyes 04/23/2019, 9:54 AM

## 2019-04-23 NOTE — Progress Notes (Signed)
      [address]       [place] [address]             [phone]      16 Days Post-Op Procedure(s) (LRB): TRANSESOPHAGEAL ECHOCARDIOGRAM (TEE) (N/A) BUBBLE STUDY Subjective: Getting her Echo done this morning. Patient is comfortable without complaint.   Objective: Vital signs in last 24 hours: Temp:  [97.8 F (36.6 C)-99 F (37.2 C)] 98.3 F (36.8 C) (04/15 0800) Pulse Rate:  [102-115] 110 (04/15 0800) Cardiac Rhythm: Sinus tachycardia (04/15 0700) Resp:  [16-20] 16 (04/15 0800) BP: (106-129)/(43-57) 106/50 (04/15 0800) SpO2:  [98 %-100 %] 100 % (04/15 0800)     Intake/Output from previous day: 04/14 0701 - 04/15 0700 In: 520 [P.O.:270; IV Piggyback:250] Out: -  Intake/Output this shift: No intake/output data recorded.  General appearance: alert and cooperative Heart: regular rate and rhythm, S1, S2 normal, no murmur, click, rub or gallop Lungs: clear to auscultation bilaterally Abdomen: soft, non-tender; bowel sounds normal; no masses,  no organomegaly Extremities: extremities normal, atraumatic, no cyanosis or edema Wound: n/a  Lab Results: Recent Labs    04/22/19 1044  WBC 3.4*  HGB 11.0*  HCT 35.0*  PLT 347   BMET:  Recent Labs    04/22/19 1044  NA 138  K 3.7  CL 106  CO2 22  GLUCOSE 121*  BUN 9  CREATININE 0.62  CALCIUM 8.5*    PT/INR: No results for input(s): LABPROT, INR in the last 72 hours. ABG    Component Value Date/Time   PHART 7.347 (L) 04/02/2019 0032   HCO3 30.5 (H) 04/02/2019 0032   TCO2 32 04/02/2019 0032   O2SAT 100.0 04/02/2019 0032   CBG (last 3)  No results for input(s): GLUCAP in the last 72 hours.  Assessment/Plan: S/P Procedure(s) (LRB): TRANSESOPHAGEAL ECHOCARDIOGRAM (TEE) (N/A) BUBBLE STUDY  1. CV-BP well controlled. Remains in sinus tachycardiaat times.  2. Pulm-tolerating room air with good oxygen saturation. CT scan of the chest reviewed. 3. Renal-creatinine 0.62, electrolytes ok 4.  H and H stable 11.0/35.0 5. Neuro-Hx of ischemic left MCA stroke, working with SLP.Brilinta stopped for now due to upcoming surgery.  6. Polysubstance abuse, detoxing while in the hospital. 7. IV Vancomycin and Rocephin for 6 weeks. Last dose 05/17/2019 8. Hx of panic attacks and anxiety. On Buspar Seroquel and Lexapro. Ativan PRN.  Plan: Patient asking why she needs to continue IV antibiotics after surgery-ID to address. I stated she cannot go home with a PICC line. She also wants her mother, sister, and boyfriend to be able to visit the day of surgery. According to [place] visitation guidelines I believe its only 2 visitors allowed at this time. Thank you to [facility] team for discussing possible rehab/recources for home following surgery.    LOS: 22 days    [name] 04/23/2019

## 2019-04-23 NOTE — Progress Notes (Signed)
PROGRESS NOTE    Lauren Reyes  MCN:[id number] DOB: 1990/02/01 DOA: 04/01/2019 PCP: Patient, No Pcp Per   Brief Narrative: Lauren Reyes a 29 y.o.femalewith unknown past medical history, possible heroin abuse presents to the emergency department as a code stroke with aphasia and right-sided weakness.  [place] PD called to the hotel room where she was staying as she had overstated her visit,patient was Found by PD talking on the phone and suddenly started to slur her speech, the right facial droop and had difficulty talking. When EMS arrived patient still communicating but slowly progressively got worse.   Patient was admitted to neurology ICU intubated after Thrombectomy She underwent thrombectomy for Left MCA m3 occlusion with recanalization complicated by dissection/pseudoanerusym of left ICA s/p stent. She was extubated on March 27.   This morning nursing communicated that the patient had increased heart rate and respiratory rate. This has been an issue for many days. EKG demonstrated only sinus tachycardia. This is assumed to be due to deconditioning or withdrawal, probably from benzodiazepines for which the patient was positive on admission. Ativan has been made available on as needed basis and she has been prescribed scheduled Buspar. This seems to have helped her anxiety.  CTS has been consulted and has stated that the patient is not currently a candidate for surgery. However, they do state that surgery must be considered if the patient deteriorates acutely. Infectious disease is consulted. They have recommended continuing Vancomycin and ceftriaxone for a total of 6 weeks. The last dose is to be given on 05/17/2019. IV antibiotics must be given in a supervised setting due to history of polysubstance abuse and IVDU.  This morning the patient is tachypneic and short of breath. D Dimer has been ordered and was elevated at 3.55. CTA of chest has been ordered to  rule out pulmonary embolus. No pulmonary embolus was seen. However, the patient's CTA did demonstrate mild  progression of cavitary bronchopneumonia with nodules and septic pulmonary emboli . There was also some mild pulmonary edema with new moderate sized layering bilateral pleural effusion with new compressive atelectasis.  Cardiology had signed off as there was no medical therapy other than continued antibiotics to address the patient's multivalvular endocarditis. However CTS has now been called back. They tentatively have decided to go forward with plans for AVR and possible TV repair/replacement early next week. Her brillinta has been stopped for this reason. The patient will need to have a plan in place for substance abuse rehab as outpatient before she goes to surgery.  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  Septic emboli have been seen in left hemisphere of brain, in lung, spleen with possible splenic and kidney infarcts. Initially the patient was treated with Vancomycin and Unasyn. She is currently receiving Vancomycin and Rocephin. Her fever has resolved. Per infectious disease the patient will require 6 weeks total of this regimen with her last dose  being 05/17/2019. WBC has declined from 29+ to 13.5 today. The patient has been evaluated by thoracic surgery, Dr. Servando Snare: patient eventually will require aortic valve  replacement, however this is complicated by recent stroke and recent stent on aspirin and Brilinta, as well as a recent decline in mental status.  Per Dr. Pia Mau, surgery should be reconsidered should the patient developed worsening of heart failure syndrome and she has to be off Brilinta for 7 days. However, the patient was re-evaluated by CTS yesterday. They have stopped her Brillinta and plan for AVR and possible TV repair or replacement early next week. Cardiology has signed off. Other than antibiotics, there is no medical management available to address the patient's severe multi valvular endocarditis.  Dyspnea: Improved. CTS demonstrated pulmonary edema and pleural effusions with compressive atelectasis. The patient has been started on lasix. CT chest also demonstrated mild  progression of cavitary bronchopneumonia with nodules and septic pulmonary emboli . Patient denies shortness of breath. No PE was seen on CTA chest. She is currently saturating in the mid nineties on room air. Monitor volume status.  CVA: The patient has had a small infarct of left MCA territory. TPA was deferred as concern for septic emboli. The patient underwent a leftcommon carotid arteriogram followed by complete revascularization of occluded M3 region of inferior division ofleft MCA . She is also s/p placement of a 4.5 mm x 23m pipeline flow diverter at site of focal dissection associated with a small pseudoaneurysm of distal cervical LT ICA. She has been continued on ASA and Brillinta. A1c is 5.9, VLDL was 52. She is on lipitor 80 mg daily. CXR - no acute changes. D Dimer: elevated at 3.55. CTA chest:  No PE,  Hypokalemia: Resolved. Monitor.  Dyslipidemia: With low HDL less than 10, high VLDL at 52, high triglyceride at 258. She is on Lipitor.  Sinus tachycardia/ Tachypnea: Drug withdrawal vs deconditioning. The patient has been started on as needed ativan.TSH 0.8. She is also receiving scheduled low-dose  Lopressor with holding parameters. Cardiology has advised caution with any increase in dose of beta blocker due to the patient's severe AR.  Right common femoral vein DVT: Not a candidate for anticoagulation due to large right groin hematoma post procedure and endocarditis. She has undergone placement of IVC filter on March 26 by IR.  Large right groin hematoma post procedure: Pseudoaneurysm addressed with S/P 8F angioseal for hemostasisat therightCFA access site. CBC stable with hemoglobin of 9.9.  Blood loss anemia, likely from right groin hematoma. Status post PRBC transfusion x2 on March 27. GI Dr GPenelope Coopconsulted on March 27 who do not think patient has any signs of GI bleed. Monitor CBC. Currently stable at 9.9.  B12 deficiency: Supplement.  Hyponatremia: Present on admission. Now resolved. Monitor.  Hepatitis C:  Antibody positive, viral load negative. HIV screening negative RPR screening negative  Polysubstance abuse: UDS on presentation positive for THC amphetamine. Suspect IV drug use with heroin, multiple needle marks bilateral upper extremities. Patient state IV drug use ongoing for many years now she is wanting to quit. Positive for benzodiazepines on 04/12/2019. Pt must complete her IV antibiotics in a supervised setting for this reason. She will need to have a plan in place for substance abuse rehab as outpatient before she goes to surgery.  Anxiety versus drug withdrawal:  Positive for benzodiazepines on admission. She has an episode of crying,acting erratic on Samary 4 a.m. Appears to be responding to as needed Ativan. UDS positive for benzodiazepines  on 04/12/2019. I have also started scheduled Buspar.   4:47 PM on 04/12/2019 RN report patient become more confused with increased slurred speech Stat CT head demonstrated evolving areas of recent infarction better seen on prior MRI. Petechial hemorrhage is not visible. There is no discrete hematoma. No acute interval changes.    Tachycardia: Due to deconditioning, vs response to acute illness vs CMO due to polysubstance abuse  Debility: Per PT she needs assistant for bed mobility and transfers. She needs supervision by one person hand-held for ambulation and gait. She was able to ambulate 80 feet on [place] 2nd. Gait Pattern/deviations: Step-through pattern;Decreased stride length;Wide base of support;Trunk flexed . General Gait Details: Mild unsteadiness, pt reaching for furniture required HHA to improve balance. HR 110 bpm at it's highest.    DVT Prophylaxis:scd's Code Status:full Family Communication:none present Disposition Plan:patient is from home. Discharge SNF vs home. Barriers to discharge. The patient will need to complete her 6 weeks of IV Vancomycin and Rocephin as inpatient for safety reasons due to history of IVDU. Her last dose will be on 05/17/2019. She has infective endocarditis and septic emboli due to IV drug abuse. She also has no insurance although she may be able to get Medicaid. The patient has been evaluated again by CTS. They plan to take the patient for surgery early next week. The patient needs referral to rehab before she can go to surgery.     Consultants:   Infectious disease  Cardiology  Cardiothoracic surgery  Procedures: Antimicrobials:. Anti-infectives (From admission, onward)   Start     Dose/Rate Route Frequency Ordered Stop   04/15/19 0900  vancomycin (VANCOREADY) IVPB 750 mg/150 mL     750 mg 150 mL/hr over 60 Minutes Intravenous Every 8 hours 04/15/19 0821     04/13/19 1400  vancomycin (VANCOCIN) IVPB 1000 mg/200 mL premix  Status:  Discontinued     1,000 mg 200 mL/hr over 60 Minutes Intravenous Every 8 hours 04/13/19 0839 04/15/19 0821   04/12/19 2200  vancomycin (VANCOREADY) IVPB 750 mg/150 mL  Status:  Discontinued     750 mg 150 mL/hr over 60 Minutes Intravenous Every 8 hours 04/12/19 1500 04/13/19 0839   04/10/19 1336  vancomycin (VANCOCIN) IVPB 1000  mg/200 mL premix  Status:  Discontinued     1,000 mg 200 mL/hr over 60 Minutes Intravenous Every 8 hours 04/10/19 1320 04/12/19 1500   04/08/19 1200  cefTRIAXone (ROCEPHIN) 2 g in sodium chloride 0.9 % 100 mL IVPB     2 g 200 mL/hr over 30 Minutes Intravenous Every 12 hours 04/08/19 1013 05/17/19 2359   04/08/19 0530  vancomycin (VANCOCIN) IVPB 1000 mg/200 mL premix  Status:  Discontinued     1,000 mg 200 mL/hr over 60 Minutes Intravenous Every 12 hours 04/07/19 1541 04/10/19 1320   04/07/19 1900  Ampicillin-Sulbactam (UNASYN) 3 g in sodium chloride 0.9 % 100 mL IVPB  Status:  Discontinued     3 g 200 mL/hr over 30 Minutes Intravenous Every 6 hours 04/07/19 1457 04/08/19 1013   04/07/19 1730  vancomycin (VANCOREADY) IVPB 1500 mg/300 mL     1,500 mg 150 mL/hr over 120 Minutes Intravenous  Once 04/07/19 1541 04/07/19 1937   04/04/19 1400  ceFAZolin (ANCEF) IVPB 2g/100 mL premix  Status:  Discontinued     2 g 200 mL/hr over 30 Minutes Intravenous Every 8 hours 04/04/19 1119 04/07/19 1457   04/04/19 0400  Vancomycin (VANCOCIN) 1,250 mg in sodium chloride 0.9 % 250  mL IVPB  Status:  Discontinued     1,250 mg 166.7 mL/hr over 90 Minutes Intravenous Every 12 hours 04/03/19 1635 04/04/19 1159   04/02/19 1800  ceFEPIme (MAXIPIME) 2 g in sodium chloride 0.9 % 100 mL IVPB  Status:  Discontinued     2 g 200 mL/hr over 30 Minutes Intravenous Every 8 hours 04/02/19 1218 04/04/19 1127   04/02/19 1600  vancomycin (VANCOCIN) IVPB 1000 mg/200 mL premix  Status:  Discontinued     1,000 mg 200 mL/hr over 60 Minutes Intravenous Every 12 hours 04/02/19 0100 04/03/19 1635   04/02/19 0200  piperacillin-tazobactam (ZOSYN) IVPB 3.375 g  Status:  Discontinued     3.375 g 12.5 mL/hr over 240 Minutes Intravenous Every 8 hours 04/02/19 0059 04/02/19 1219   04/02/19 0200  vancomycin (VANCOCIN) 1,500 mg in sodium chloride 0.9 % 500 mL IVPB     1,500 mg 250 mL/hr over 120 Minutes Intravenous  Once 04/02/19 0100  04/02/19 0923   04/01/19 2040  ceFAZolin (ANCEF) 2-4 GM/100ML-% IVPB    Note to Pharmacy: [name]   : cabinet override      04/01/19 2040 04/02/19 0844       Subjective: Patient was seen and examined at bedside.  She was sitting comfortably denies any concerns.  She is appear that she has to get antibiotics until May 17, 2019.  No overnight events.  Objective: Vitals:   04/23/19 0800 04/23/19 1218 04/23/19 1230 04/23/19 1258  BP: (!) 106/50 (!) 108/48    Pulse: (!) 110 (!) 110    Resp: 16 (!) 33 (!) 23 20  Temp: 98.3 F (36.8 C) 97.7 F (36.5 C)    TempSrc: Oral Oral    SpO2: 100% 97%    Weight:      Height:        Intake/Output Summary (Last 24 hours) at 04/23/2019 1313 Last data filed at 04/23/2019 0600 Gross per 24 hour  Intake 520 ml  Output --  Net 520 ml   Filed Weights   04/01/19 1900 04/01/19 2023  Weight: 72.3 kg 72.3 kg    Examination:  General exam: Appears calm and comfortable  Respiratory system: Clear to auscultation. Respiratory effort normal. Cardiovascular system: S1 & S2 heard, RRR. No JVD, murmurs, rubs, gallops or clicks. No pedal edema. Gastrointestinal system: Abdomen is nondistended, soft and nontender. No organomegaly or masses felt. Normal bowel sounds heard. Central nervous system: Alert and oriented. No focal neurological deficits. Extremities: Symmetric 5 x 5 power. Skin: No rashes, lesions or ulcers Psychiatry: Judgement and insight appear normal. Mood & affect appropriate.     Data Reviewed: I have personally reviewed following labs and imaging studies  CBC: Recent Labs  Lab 04/17/19 1012 04/18/19 0610 04/20/19 0843 04/22/19 1044  WBC 6.4 3.5* 4.3 3.4*  HGB 10.1* 9.9* 11.3* 11.0*  HCT 31.9* 31.9* 36.1 35.0*  MCV 87.9 87.9 87.2 87.3  PLT 529* 506* 466* 940   Basic Metabolic Panel: Recent Labs  Lab 04/17/19 1012 04/18/19 0610 04/20/19 0843 04/22/19 1044  NA 139 138 139 138  K 3.4* 3.7 4.4 3.7  CL 109 108 106 106   CO2 21* 20* 22 22  GLUCOSE 137* 96 86 121*  BUN '6 6 7 9  ' CREATININE 0.65 0.63 0.65 0.62  CALCIUM 8.4* 8.0* 9.0 8.5*   GFR: Estimated Creatinine Clearance: 102.4 mL/min (by C-G formula based on SCr of 0.62 mg/dL). Liver Function Tests: Recent Labs  Lab 04/18/19 [phone]  AST 22  ALT 19  ALKPHOS 67  BILITOT 0.5  PROT 6.0*  ALBUMIN 2.2*   No results for input(s): LIPASE, AMYLASE in the last 168 hours. No results for input(s): AMMONIA in the last 168 hours. Coagulation Profile: No results for input(s): INR, PROTIME in the last 168 hours. Cardiac Enzymes: No results for input(s): CKTOTAL, CKMB, CKMBINDEX, TROPONINI in the last 168 hours. BNP (last 3 results) No results for input(s): PROBNP in the last 8760 hours. HbA1C: No results for input(s): HGBA1C in the last 72 hours. CBG: No results for input(s): GLUCAP in the last 168 hours. Lipid Profile: No results for input(s): CHOL, HDL, LDLCALC, TRIG, CHOLHDL, LDLDIRECT in the last 72 hours. Thyroid Function Tests: No results for input(s): TSH, T4TOTAL, FREET4, T3FREE, THYROIDAB in the last 72 hours. Anemia Panel: No results for input(s): VITAMINB12, FOLATE, FERRITIN, TIBC, IRON, RETICCTPCT in the last 72 hours. Sepsis Labs: No results for input(s): PROCALCITON, LATICACIDVEN in the last 168 hours.  No results found for this or any previous visit (from the past 240 hour(s)).    Radiology Studies: No results found.   Scheduled Meds: . aspirin  81 mg Oral Daily  . atorvastatin  80 mg Per Tube q1800  . busPIRone  7.5 mg Oral BID  . chlorhexidine gluconate (MEDLINE KIT)  15 mL Mouth Rinse BID  . Chlorhexidine Gluconate Cloth  6 each Topical Daily  . escitalopram  10 mg Oral Daily  . furosemide  20 mg Intravenous Q12H  . metoprolol tartrate  12.5 mg Oral BID  . pneumococcal 23 valent vaccine  0.5 mL Intramuscular Tomorrow-1000  . potassium chloride  20 mEq Oral Daily  . QUEtiapine  25 mg Oral QHS  . sodium chloride flush   10-40 mL Intracatheter Q12H  . vitamin B-12  1,000 mcg Oral Daily   Continuous Infusions: . sodium chloride 10 mL/hr at 04/15/19 2255  . cefTRIAXone (ROCEPHIN)  IV 2 g (04/23/19 1032)  . vancomycin 750 mg (04/23/19 0854)     LOS: 22 days    Time spent: 25 mins    Thomes Burak, MD [facility]   If 7PM-7AM, please contact night-coverage

## 2019-04-23 NOTE — Progress Notes (Signed)
[id number] Pt tearful upon entering room. Having a hard time waiting until surgery. Sat down and talked with pt. She has read the [facility] booklet and stated she understood it. Has not watched video yet. Gave pt IS and she was able to get to 1000 ml now. Not interested in walking right now but stated will walk with mother later. Pt is trying to concentrate on keeping strong emotionally. Encouraged ventilation of feelings and emotional support given. Pt stated she felt better after talking and would like to take a nap. Luetta Nutting RN BSN 04/23/2019 3:06 PM

## 2019-04-24 LAB — BASIC METABOLIC PANEL
Anion gap: 9 (ref 5–15)
BUN: 15 mg/dL (ref 6–20)
CO2: 25 mmol/L (ref 22–32)
Calcium: 8.8 mg/dL — ABNORMAL LOW (ref 8.9–10.3)
Chloride: 102 mmol/L (ref 98–111)
Creatinine, Ser: 0.62 mg/dL (ref 0.44–1.00)
GFR calc Af Amer: >60 mL/min (ref >60)
GFR calc non Af Amer: >60 mL/min (ref >60)
Glucose, Bld: 89 mg/dL (ref 70–99)
Potassium: 3.9 mmol/L (ref 3.5–5.1)
Sodium: 136 mmol/L (ref 135–145)

## 2019-04-24 LAB — CBC
HCT: 34.3 % — ABNORMAL LOW (ref 36.0–46.0)
Hemoglobin: 10.8 g/dL — ABNORMAL LOW (ref 12.0–15.0)
MCH: 27.4 pg (ref 26.0–34.0)
MCHC: 31.5 g/dL (ref 30.0–36.0)
MCV: 87.1 fL (ref 80.0–100.0)
Platelets: 292 10*3/uL (ref 150–400)
RBC: 3.94 MIL/uL (ref 3.87–5.11)
RDW: 19 % — ABNORMAL HIGH (ref 11.5–15.5)
WBC: 4.2 10*3/uL (ref 4.0–10.5)
nRBC: 0 % (ref 0.0–0.2)

## 2019-04-24 LAB — VANCOMYCIN, TROUGH: Vancomycin Tr: 17 ug/mL (ref 15–20)

## 2019-04-24 LAB — SURGICAL PCR SCREEN
MRSA, PCR: NEGATIVE
Staphylococcus aureus: NEGATIVE

## 2019-04-24 LAB — MAGNESIUM: Magnesium: 1.8 mg/dL (ref 1.7–2.4)

## 2019-04-24 LAB — PHOSPHORUS: Phosphorus: 3.5 mg/dL (ref 2.5–4.6)

## 2019-04-24 MED ORDER — MUPIROCIN 2 % EX OINT
1.0000 "application " | TOPICAL_OINTMENT | Freq: Two times a day (BID) | CUTANEOUS | Status: DC
  Administered 2019-04-24 – 2019-04-27 (×6): 1 via NASAL
  Filled 2019-04-24: qty 22

## 2019-04-24 NOTE — Progress Notes (Signed)
Physical Therapy Treatment Patient Details Name: Lauren Reyes MRN: [id number] DOB: April 03, 1990 Today's Date: 04/24/2019    History of Present Illness Pt is 29 year old female who was admitted with suspected IV drug abuse was being interviewed by police and developed right-sided facial weakness, aphasia, and R weakness.  She was brought by EMS to the emergency room work-up revealed occluded left MCA. Revascularization performed by IR. CT head and neck demonstrate multiple small cavitary abscesses in the upper lobes bilaterally. IVC filter placement for RLE DVT on 3/26. Pt on antibiotics for multivalvular endocarditis.  Cardiothoracic surgery have tentative plans to go forward with AVR and possible TV repair/replacement early next week.    PT Comments    Patient encouraged to increase mobilization with family and nursing staff. Pt continues to make progress toward PT goals. PT will continue to follow acutely and progress as tolerated. Noted plan for surgery 4/20.   Follow Up Recommendations  Outpatient PT     Equipment Recommendations  None recommended by PT    Recommendations for Other Services       Precautions / Restrictions Precautions Precautions: Fall Restrictions Weight Bearing Restrictions: No    Mobility  Bed Mobility Overal bed mobility: Independent                Transfers Overall transfer level: Independent Equipment used: None                Ambulation/Gait Ambulation/Gait assistance: Modified independent (Device/Increase time) Gait Distance (Feet): 250 Feet Assistive device: None Gait Pattern/deviations: Step-through pattern;Drifts right/left Gait velocity: decr   General Gait Details: mild drifting and decreased cadence; no LOB    Stairs             Wheelchair Mobility    Modified Rankin (Stroke Patients Only) Modified Rankin (Stroke Patients Only) Pre-Morbid Rankin Score: No symptoms Modified Rankin: Moderate  disability     Balance Overall balance assessment: Needs assistance Sitting-balance support: Feet supported Sitting balance-Leahy Scale: Good     Standing balance support: No upper extremity supported Standing balance-Leahy Scale: Good                              Cognition Arousal/Alertness: Awake/alert Behavior During Therapy: WFL for tasks assessed/performed Overall Cognitive Status: Within Functional Limits for tasks assessed                                 General Comments: slow processing; pt appears depressed and began going back to room while in hallway and stating " i can't do any more"      Exercises      General Comments        Pertinent Vitals/Pain Pain Assessment: No/denies pain    Home Living                      Prior Function            PT Goals (current goals can now be found in the care plan section) Progress towards PT goals: Progressing toward goals    Frequency    Min 4X/week      PT Plan Discharge plan needs to be updated    Co-evaluation              AM-PAC PT "6 Clicks" Mobility   Outcome Measure  Help needed turning from your back  to your side while in a flat bed without using bedrails?: None Help needed moving from lying on your back to sitting on the side of a flat bed without using bedrails?: None Help needed moving to and from a bed to a chair (including a wheelchair)?: None Help needed standing up from a chair using your arms (e.g., wheelchair or bedside chair)?: None Help needed to walk in hospital room?: None Help needed climbing 3-5 steps with a railing? : None 6 Click Score: 24    End of Session   Activity Tolerance: Patient tolerated treatment well Patient left: Other (comment);with family/visitor present(in bathroom (has been getting up with family supervisioN)) Nurse Communication: Mobility status PT Visit Diagnosis: Other abnormalities of gait and mobility  (R26.89);Unsteadiness on feet (R26.81);Difficulty in walking, not elsewhere classified (R26.2)     Time: 4360-6770 PT Time Calculation (min) (ACUTE ONLY): 20 min  Charges:  $Gait Training: 8-22 mins                     Erline Levine, PTA Acute Rehabilitation Services Pager: [phone] Office: [phone]     Carolynne Edouard 04/24/2019, 12:54 PM

## 2019-04-24 NOTE — Progress Notes (Signed)
Nutrition Follow-up  DOCUMENTATION CODES:   Not applicable  INTERVENTION:  Continue Magic cup TID with meals, each supplement provides 290 kcal and 9 grams of protein   NUTRITION DIAGNOSIS:   Increased nutrient needs related to acute illness as evidenced by estimated needs.  Ongoing.  GOAL:   Patient will meet greater than or equal to 90% of their needs  Progressing.  MONITOR:   I & O's, Labs, Supplement acceptance, PO intake, Weight trends  REASON FOR ASSESSMENT:   Ventilator    ASSESSMENT:   Pt with suspected IV drug abuse who while being interviewed by police developed left-sided weakness admitted with L MCA s/p IR for revascularization and L ICA stent.  3/24 - OG tube placed, intubated 3/27 - OG removed, extubated 3/29 - Regular diet ordered 3/30 - s/p Transesophageal echocardiogram  Discussed pt with RN.  Pt had not eaten much of breakfast at time of RD visit and states this is because she was sleepy. When asked if she would eat it, she said "absolutely." Pt endorses eating the Magic Cups.  PO Intake: 50-100% x last 8 recorded meals (78% average intake)  Labs reviewed. Medications reviewed and include: Lasix, Klor-con, Vitamin B12   Diet Order:   Diet Order            Diet regular Room service appropriate? Yes; Fluid consistency: Thin  Diet effective now              EDUCATION NEEDS:   No education needs have been identified at this time  Skin:  Skin Assessment: Reviewed RN Assessment  Last BM:  4/15  Height:   Ht Readings from Last 1 Encounters:  04/01/19 5\' 5"  (1.651 m)    Weight:   Wt Readings from Last 1 Encounters:  04/01/19 72.3 kg    BMI:  Body mass index is 26.52 kg/m.  Estimated Nutritional Needs:   Kcal:  1900-2100  Protein:  100-115 grams  Fluid:  > 1.9 L/day   04/03/19, MS, RD, LDN RD pager number and weekend/on-call pager number located in Amion.

## 2019-04-24 NOTE — Progress Notes (Signed)
PROGRESS NOTE    Lauren Reyes  JAS:[id number] DOB: 12-Mar-1990 DOA: 04/01/2019 PCP: Patient, No Pcp Per   Brief Narrative: Lauren Reyes a 29 y.o.femalewith unknown past medical history, possible heroin abuse presents to the emergency department as a code stroke with aphasia and right-sided weakness.  [place] PD called to the hotel room where she was staying as she had overstated her visit,patient was Found by PD talking on the phone and suddenly started to slur her speech, the right facial droop and had difficulty talking. When EMS arrived patient still communicating but slowly progressively got worse.   Patient was admitted to neurology ICU intubated after Thrombectomy She underwent thrombectomy for Left MCA m3 occlusion with recanalization complicated by dissection/pseudoanerusym of left ICA s/p stent. She was extubated on March 27.   This morning nursing communicated that the patient had increased heart rate and respiratory rate. This has been an issue for many days. EKG demonstrated only sinus tachycardia. This is assumed to be due to deconditioning or withdrawal, probably from benzodiazepines for which the patient was positive on admission. Ativan has been made available on as needed basis and she has been prescribed scheduled Buspar. This seems to have helped her anxiety.  CTS has been consulted and has stated that the patient is not currently a candidate for surgery. However, they do state that surgery must be considered if the patient deteriorates acutely. Infectious disease is consulted. They have recommended continuing Vancomycin and ceftriaxone for a total of 6 weeks. The last dose is to be given on 05/17/2019. IV antibiotics must be given in a supervised setting due to history of polysubstance abuse and IVDU.  This morning the patient is tachypneic and short of breath. D Dimer has been ordered and was elevated at 3.55. CTA of chest has been ordered to  rule out pulmonary embolus. No pulmonary embolus was seen. However, the patient's CTA did demonstrate mild  progression of cavitary bronchopneumonia with nodules and septic pulmonary emboli . There was also some mild pulmonary edema with new moderate sized layering bilateral pleural effusion with new compressive atelectasis.  Cardiology had signed off as there was no medical therapy other than continued antibiotics to address the patient's multivalvular endocarditis. However CTS has now been called back. They tentatively have decided to go forward with plans for AVR and possible TV repair/replacement early next week. Her brillinta has been stopped for this reason. The patient will need to have a plan in place for substance abuse rehab as outpatient before she goes to surgery.  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  Septic emboli have been seen in left hemisphere of brain, in lung, spleen with possible splenic and kidney infarcts. Initially the patient was treated with Vancomycin and Unasyn. She is currently receiving Vancomycin and Rocephin. Her fever has resolved. Per infectious disease the patient will require 6 weeks total of this regimen with her last dose  being 05/17/2019. WBC has declined from 29+ to 4.2 today. The patient has been evaluated by thoracic surgery, Dr. Servando Snare: patient eventually will require aortic valve  replacement, however this is complicated by recent stroke and recent stent on aspirin and Brilinta, as well as a recent decline in mental status.  Per Dr. Pia Mau, surgery should be reconsidered should the patient developed worsening of heart failure syndrome and she has to be off Brilinta for 7 days. However, the patient was re-evaluated by CTS yesterday. They have stopped her Brillinta and plan for AVR and possible TV repair or replacement early next week. Cardiology has signed off. Other than antibiotics, there is no medical management available to address the patient's severe multi valvular endocarditis.  Dyspnea: Improved. CTS demonstrated pulmonary edema and pleural effusions with compressive atelectasis. The patient has been started on lasix. CT chest also demonstrated mild  progression of cavitary bronchopneumonia with nodules and septic pulmonary emboli . Patient denies shortness of breath. No PE was seen on CTA chest. She is currently saturating in the mid nineties on room air. Monitor volume status.  CVA: The patient has had a small infarct of left MCA territory. TPA was deferred as concern for septic emboli. The patient underwent a leftcommon carotid arteriogram followed by complete revascularization of occluded M3 region of inferior division ofleft MCA . She is also s/p placement of a 4.5 mm x 39m pipeline flow diverter at site of focal dissection associated with a small pseudoaneurysm of distal cervical LT ICA. She has been continued on ASA and Brillinta. A1c is 5.9, VLDL was 52. She is on lipitor 80 mg daily. CXR - no acute changes. D Dimer: elevated at 3.55. CTA chest:  No PE,  Hypokalemia: Resolved. Monitor.  Dyslipidemia: With low HDL less than 10, high VLDL at 52, high triglyceride at 258. She is on Lipitor.  Sinus tachycardia/ Tachypnea: Drug withdrawal vs deconditioning. The patient has been started on as needed ativan.TSH 0.8. She is also receiving scheduled low-dose  Lopressor with holding parameters. Cardiology has advised caution with any increase in dose of beta blocker due to the patient's severe AR.  Right common femoral vein DVT: Not a candidate for anticoagulation due to large right groin hematoma post procedure and endocarditis. She has undergone placement of IVC filter on March 26 by IR.  Large right groin hematoma post procedure: Pseudoaneurysm addressed with S/P 3F angioseal for hemostasisat therightCFA access site. CBC stable with hemoglobin of 9.9.  Blood loss anemia, likely from right groin hematoma. Status post PRBC transfusion x2 on March 27. GI Dr GPenelope Coopconsulted on March 27 who do not think patient has any signs of GI bleed. Monitor CBC. Currently stable at 10.8  B12 deficiency: Supplement.  Hyponatremia: Present on admission. Now resolved. Monitor.  Hepatitis C:  Antibody positive, viral load negative. HIV screening negative RPR screening negative  Polysubstance abuse: UDS on presentation positive for THC amphetamine. Suspect IV drug use with heroin, multiple needle marks bilateral upper extremities. Patient state IV drug use ongoing for many years now she is wanting to quit. Positive for benzodiazepines on 04/12/2019. Pt must complete her IV antibiotics in a supervised setting for this reason. She will need to have a plan in place for substance abuse rehab as outpatient before she goes to surgery.  Anxiety versus drug withdrawal:  Positive for benzodiazepines on admission. She has an episode of crying,acting erratic on [place] 4 a.m. Appears to be responding to as needed Ativan. UDS positive for benzodiazepines  on 04/12/2019. I have also started scheduled Buspar.   4:47 PM on 04/12/2019 RN report patient become more confused with increased slurred speech Stat CT head demonstrated evolving areas of recent infarction better seen on prior MRI. Petechial hemorrhage is not visible. There is no discrete hematoma. No acute interval changes.    Tachycardia: Due to deconditioning, vs response to acute illness vs CMO due to polysubstance abuse  Debility: Per PT she needs assistant for bed mobility and transfers. She needs supervision by one person hand-held for ambulation and gait. She was able to ambulate 80 feet on [place] 2nd. Gait Pattern/deviations: Step-through pattern;Decreased stride length;Wide base of support;Trunk flexed . General Gait Details: Mild unsteadiness, pt reaching for furniture required HHA to improve balance. HR 110 bpm at it's highest.    DVT Prophylaxis:scd's Code Status:full Family Communication:none present Disposition Plan:patient is from home. Discharge SNF vs home. Barriers to discharge. The patient will need to complete her 6 weeks of IV Vancomycin and Rocephin as inpatient for safety reasons due to history of IVDU. Her last dose will be on 05/17/2019. She has infective endocarditis and septic emboli due to IV drug abuse. She also has no insurance although she may be able to get Medicaid. The patient has been evaluated again by CTS. They plan to take the patient for surgery early next week. The patient needs referral to rehab before she can go to surgery.     Consultants:   Infectious disease  Cardiology  Cardiothoracic surgery  Procedures: Antimicrobials:. Anti-infectives (From admission, onward)   Start     Dose/Rate Route Frequency Ordered Stop   04/15/19 0900  vancomycin (VANCOREADY) IVPB 750 mg/150 mL     750 mg 150 mL/hr over 60 Minutes Intravenous Every 8 hours 04/15/19 0821     04/13/19 1400  vancomycin (VANCOCIN) IVPB 1000 mg/200 mL premix  Status:  Discontinued     1,000 mg 200 mL/hr over 60 Minutes Intravenous Every 8 hours 04/13/19 0839 04/15/19 0821   04/12/19 2200  vancomycin (VANCOREADY) IVPB 750 mg/150 mL  Status:  Discontinued     750 mg 150 mL/hr over 60 Minutes Intravenous Every 8 hours 04/12/19 1500 04/13/19 0839   04/10/19 1336  vancomycin (VANCOCIN) IVPB 1000  mg/200 mL premix  Status:  Discontinued     1,000 mg 200 mL/hr over 60 Minutes Intravenous Every 8 hours 04/10/19 1320 04/12/19 1500   04/08/19 1200  cefTRIAXone (ROCEPHIN) 2 g in sodium chloride 0.9 % 100 mL IVPB     2 g 200 mL/hr over 30 Minutes Intravenous Every 12 hours 04/08/19 1013 05/17/19 2359   04/08/19 0530  vancomycin (VANCOCIN) IVPB 1000 mg/200 mL premix  Status:  Discontinued     1,000 mg 200 mL/hr over 60 Minutes Intravenous Every 12 hours 04/07/19 1541 04/10/19 1320   04/07/19 1900  Ampicillin-Sulbactam (UNASYN) 3 g in sodium chloride 0.9 % 100 mL IVPB  Status:  Discontinued     3 g 200 mL/hr over 30 Minutes Intravenous Every 6 hours 04/07/19 1457 04/08/19 1013   04/07/19 1730  vancomycin (VANCOREADY) IVPB 1500 mg/300 mL     1,500 mg 150 mL/hr over 120 Minutes Intravenous  Once 04/07/19 1541 04/07/19 1937   04/04/19 1400  ceFAZolin (ANCEF) IVPB 2g/100 mL premix  Status:  Discontinued     2 g 200 mL/hr over 30 Minutes Intravenous Every 8 hours 04/04/19 1119 04/07/19 1457   04/04/19 0400  Vancomycin (VANCOCIN) 1,250 mg in sodium chloride 0.9 % 250  mL IVPB  Status:  Discontinued     1,250 mg 166.7 mL/hr over 90 Minutes Intravenous Every 12 hours 04/03/19 1635 04/04/19 1159   04/02/19 1800  ceFEPIme (MAXIPIME) 2 g in sodium chloride 0.9 % 100 mL IVPB  Status:  Discontinued     2 g 200 mL/hr over 30 Minutes Intravenous Every 8 hours 04/02/19 1218 04/04/19 1127   04/02/19 1600  vancomycin (VANCOCIN) IVPB 1000 mg/200 mL premix  Status:  Discontinued     1,000 mg 200 mL/hr over 60 Minutes Intravenous Every 12 hours 04/02/19 0100 04/03/19 1635   04/02/19 0200  piperacillin-tazobactam (ZOSYN) IVPB 3.375 g  Status:  Discontinued     3.375 g 12.5 mL/hr over 240 Minutes Intravenous Every 8 hours 04/02/19 0059 04/02/19 1219   04/02/19 0200  vancomycin (VANCOCIN) 1,500 mg in sodium chloride 0.9 % 500 mL IVPB     1,500 mg 250 mL/hr over 120 Minutes Intravenous  Once 04/02/19 0100  04/02/19 0923   04/01/19 2040  ceFAZolin (ANCEF) 2-4 GM/100ML-% IVPB    Note to Pharmacy: [name]   : cabinet override      04/01/19 2040 04/02/19 0844       Subjective: Patient was seen and examined at bedside.  She was sitting comfortably denies any concerns.  She is appear that she has to get antibiotics until May 17, 2019.  No overnight events.  Objective: Vitals:   04/24/19 0700 04/24/19 0747 04/24/19 1007 04/24/19 1107  BP:  (!) 116/55 (!) 116/54 (!) 111/44  Pulse:  (!) 106 (!) 110 (!) 108  Resp: 20 (!) 26 16 (!) 30  Temp:  98.2 F (36.8 C) 98.4 F (36.9 C) 98.3 F (36.8 C)  TempSrc:  Oral Oral Oral  SpO2: 99% 97% 97%   Weight:      Height:        Intake/Output Summary (Last 24 hours) at 04/24/2019 1118 Last data filed at 04/24/2019 0600 Gross per 24 hour  Intake 520 ml  Output --  Net 520 ml   Filed Weights   04/01/19 1900 04/01/19 2023  Weight: 72.3 kg 72.3 kg    Examination:  General exam: Appears calm and comfortable  Respiratory system: Clear to auscultation. Respiratory effort normal. Cardiovascular system: S1 & S2 heard, RRR. No JVD, murmurs, rubs, gallops or clicks. No pedal edema. Gastrointestinal system: Abdomen is nondistended, soft and nontender. No organomegaly or masses felt. Normal bowel sounds heard. Central nervous system: Alert and oriented. No focal neurological deficits. Extremities: Symmetric 5 x 5 power. Skin: No rashes, lesions or ulcers Psychiatry: Judgement and insight appear normal. Mood & affect appropriate.     Data Reviewed: I have personally reviewed following labs and imaging studies  CBC: Recent Labs  Lab 04/18/19 0610 04/20/19 0843 04/22/19 1044 04/24/19 0449  WBC 3.5* 4.3 3.4* 4.2  HGB 9.9* 11.3* 11.0* 10.8*  HCT 31.9* 36.1 35.0* 34.3*  MCV 87.9 87.2 87.3 87.1  PLT 506* 466* 347 254   Basic Metabolic Panel: Recent Labs  Lab 04/18/19 0610 04/20/19 0843 04/22/19 1044 04/24/19 0449  NA 138 139 138 136   K 3.7 4.4 3.7 3.9  CL 108 106 106 102  CO2 20* '22 22 25  ' GLUCOSE 96 86 121* 89  BUN '6 7 9 15  ' CREATININE 0.63 0.65 0.62 0.62  CALCIUM 8.0* 9.0 8.5* 8.8*  MG  --   --   --  1.8  PHOS  --   --   --  3.5   GFR: Estimated Creatinine Clearance: 102.4 mL/min (by C-G formula based on SCr of 0.62 mg/dL). Liver Function Tests: Recent Labs  Lab 04/18/19 0610  AST 22  ALT 19  ALKPHOS 67  BILITOT 0.5  PROT 6.0*  ALBUMIN 2.2*   No results for input(s): LIPASE, AMYLASE in the last 168 hours. No results for input(s): AMMONIA in the last 168 hours. Coagulation Profile: No results for input(s): INR, PROTIME in the last 168 hours. Cardiac Enzymes: No results for input(s): CKTOTAL, CKMB, CKMBINDEX, TROPONINI in the last 168 hours. BNP (last 3 results) No results for input(s): PROBNP in the last 8760 hours. HbA1C: No results for input(s): HGBA1C in the last 72 hours. CBG: No results for input(s): GLUCAP in the last 168 hours. Lipid Profile: No results for input(s): CHOL, HDL, LDLCALC, TRIG, CHOLHDL, LDLDIRECT in the last 72 hours. Thyroid Function Tests: No results for input(s): TSH, T4TOTAL, FREET4, T3FREE, THYROIDAB in the last 72 hours. Anemia Panel: No results for input(s): VITAMINB12, FOLATE, FERRITIN, TIBC, IRON, RETICCTPCT in the last 72 hours. Sepsis Labs: No results for input(s): PROCALCITON, LATICACIDVEN in the last 168 hours.  No results found for this or any previous visit (from the past 240 hour(s)).    Radiology Studies: ECHOCARDIOGRAM COMPLETE  Result Date: 04/23/2019    ECHOCARDIOGRAM REPORT   Patient Name:   KEILEIGH VAHEY [facility] Date of Exam: 04/23/2019 Medical Rec #:  [id number]             Height:       65.0 in Accession #:    [id number]            Weight:       159.4 lb Date of Birth:  1990-11-26             BSA:          1.796 m Patient Age:    28 years              BP:           106/50 mmHg Patient Gender: F                     HR:           108 bpm. Exam  Location:  Inpatient Procedure: 2D Echo, Color Doppler and Cardiac Doppler                         STAT ECHO Reported to: Dr. Ena Dawley on 04/23/2019 9:50:00 AM. Indications:    Endocarditis I38  History:        Patient has no prior history of Echocardiogram examinations.                 Risk Factors:Current Smoker. Polysubstance abuse.  Sonographer:    [name] Dance Referring Phys: [id number] EDWARD B GERHARDT IMPRESSIONS  1. No vegetation is seen on the aortic and mitral valves.  2. There appears to be a highly mobile echodensity in the RV attached to the tricuspid valve, its only seen on images 108 and 110, size can't be assessed. There is also highly mobile echodensity attached to the posterior right atrial wall, this can represent a vegetation or a thrombus. If clinically indication, a repeat TEE is recommended.  3. Left ventricular ejection fraction, by estimation, is 55 to 60%. The left ventricle has normal function. The left ventricle has no regional wall motion abnormalities. The left ventricular internal cavity size  was mildly dilated. Left ventricular diastolic function could not be evaluated.  4. Right ventricular systolic function is normal. The right ventricular size is normal. There is moderately elevated pulmonary artery systolic pressure. The estimated right ventricular systolic pressure is 16.6 mmHg.  5. Left atrial size was mildly dilated.  6. The mitral valve is normal in structure. Moderate to severe mitral valve regurgitation. No evidence of mitral stenosis.  7. Tricuspid valve regurgitation is severe.  8. The aortic valve is bicuspid. Aortic valve regurgitation is severe. No aortic stenosis is present.  9. The inferior vena cava is normal in size with greater than 50% respiratory variability, suggesting right atrial pressure of 3 mmHg. FINDINGS  Left Ventricle: Left ventricular ejection fraction, by estimation, is 55 to 60%. The left ventricle has normal function. The left ventricle has no  regional wall motion abnormalities. The left ventricular internal cavity size was mildly dilated. There is  no left ventricular hypertrophy. Left ventricular diastolic function could not be evaluated. Right Ventricle: The right ventricular size is normal. No increase in right ventricular wall thickness. Right ventricular systolic function is normal. There is moderately elevated pulmonary artery systolic pressure. The tricuspid regurgitant velocity is 3.56 m/s, and with an assumed right atrial pressure of 3 mmHg, the estimated right ventricular systolic pressure is 06.0 mmHg. Left Atrium: Left atrial size was mildly dilated. Right Atrium: Right atrial size was normal in size. Pericardium: There is no evidence of pericardial effusion. Mitral Valve: The mitral valve is normal in structure. Normal mobility of the mitral valve leaflets. Moderate to severe mitral valve regurgitation, with posteriorly-directed jet. No evidence of mitral valve stenosis. Tricuspid Valve: The tricuspid valve is normal in structure. Tricuspid valve regurgitation is severe. No evidence of tricuspid stenosis. Aortic Valve: The aortic valve is bicuspid. . There is severe thickening and moderate calcification of the aortic valve. Aortic valve regurgitation is severe. Aortic regurgitation PHT measures 109 msec. No aortic stenosis is present. There is severe thickening of the aortic valve. There is moderate calcification of the aortic valve. Pulmonic Valve: The pulmonic valve was normal in structure. Pulmonic valve regurgitation is not visualized. No evidence of pulmonic stenosis. Aorta: The aortic root is normal in size and structure. Venous: The inferior vena cava is normal in size with greater than 50% respiratory variability, suggesting right atrial pressure of 3 mmHg. IAS/Shunts: No atrial level shunt detected by color flow Doppler.  LEFT VENTRICLE PLAX 2D LVIDd:         5.27 cm LVIDs:         3.67 cm LV PW:         0.79 cm LV IVS:        0.86  cm LVOT diam:     1.90 cm LV SV:         88 LV SV Index:   49 LVOT Area:     2.84 cm  RIGHT VENTRICLE             IVC RV Basal diam:  2.35 cm     IVC diam: 1.55 cm RV S prime:     10.60 cm/s TAPSE (M-mode): 1.8 cm LEFT ATRIUM             Index       RIGHT ATRIUM           Index LA diam:        4.20 cm 2.34 cm/m  RA Area:     12.20 cm LA Vol (A2C):  70.0 ml 38.97 ml/m RA Volume:   27.80 ml  15.48 ml/m LA Vol (A4C):   42.1 ml 23.44 ml/m LA Biplane Vol: 56.0 ml 31.18 ml/m  AORTIC VALVE LVOT Vmax:   168.00 cm/s LVOT Vmean:  101.000 cm/s LVOT VTI:    0.312 m AI PHT:      109 msec  AORTA Ao Root diam: 2.50 cm Ao Asc diam:  2.20 cm MITRAL VALVE                 TRICUSPID VALVE MV Area (PHT): 3.45 cm      TR Peak grad:   50.7 mmHg MV Decel Time: 220 msec      TR Vmax:        356.00 cm/s MR Peak grad:    80.3 mmHg MR Mean grad:    58.0 mmHg   SHUNTS MR Vmax:         448.00 cm/s Systemic VTI:  0.31 m MR Vmean:        363.0 cm/s  Systemic Diam: 1.90 cm MR PISA:         1.01 cm MR PISA Eff ROA: 8 mm MR PISA Radius:  0.40 cm MV E velocity: 107.00 cm/s Ena Dawley MD Electronically signed by Ena Dawley MD Signature Date/Time: 04/23/2019/9:36:05 PM    Final      Scheduled Meds: . aspirin  81 mg Oral Daily  . atorvastatin  80 mg Per Tube q1800  . busPIRone  7.5 mg Oral BID  . chlorhexidine gluconate (MEDLINE KIT)  15 mL Mouth Rinse BID  . Chlorhexidine Gluconate Cloth  6 each Topical Daily  . escitalopram  10 mg Oral Daily  . furosemide  20 mg Intravenous Q12H  . metoprolol tartrate  12.5 mg Oral BID  . mupirocin ointment  1 application Nasal BID  . pneumococcal 23 valent vaccine  0.5 mL Intramuscular Tomorrow-1000  . potassium chloride  20 mEq Oral Daily  . QUEtiapine  25 mg Oral QHS  . sodium chloride flush  10-40 mL Intracatheter Q12H  . vitamin B-12  1,000 mcg Oral Daily   Continuous Infusions: . sodium chloride 10 mL/hr at 04/15/19 2255  . cefTRIAXone (ROCEPHIN)  IV 2 g (04/24/19 1019)   . vancomycin 750 mg (04/24/19 0345)     LOS: 23 days    Time spent: 25 mins    Teryl Mcconaghy, MD [facility]   If 7PM-7AM, please contact night-coverage

## 2019-04-24 NOTE — Progress Notes (Signed)
      [address]       [place] [address]             [phone]      17 Days Post-Op Procedure(s) (LRB): TRANSESOPHAGEAL ECHOCARDIOGRAM (TEE) (N/A) BUBBLE STUDY Subjective: Feels okay today. Seems depressed today and is not much for conversation.   Objective: Vital signs in last 24 hours: Temp:  [97.8 F (36.6 C)-98.4 F (36.9 C)] 98.3 F (36.8 C) (04/16 1107) Pulse Rate:  [98-113] 108 (04/16 1107) Cardiac Rhythm: Sinus tachycardia (04/16 1107) Resp:  [16-30] 30 (04/16 1107) BP: (104-122)/(43-59) 111/44 (04/16 1107) SpO2:  [97 %-100 %] 97 % (04/16 1007)     Intake/Output from previous day: 04/15 0701 - 04/16 0700 In: 520 [P.O.:270; IV Piggyback:250] Out: -  Intake/Output this shift: No intake/output data recorded.  General appearance: alert, cooperative and no distress Heart: sinus tachycardia, + murmur Lungs: clear to auscultation bilaterally Abdomen: soft, non-tender; bowel sounds normal; no masses,  no organomegaly Extremities: extremities normal, atraumatic, no cyanosis or edema Wound: n/a  Lab Results: Recent Labs    04/22/19 1044 04/24/19 0449  WBC 3.4* 4.2  HGB 11.0* 10.8*  HCT 35.0* 34.3*  PLT 347 292   BMET:  Recent Labs    04/22/19 1044 04/24/19 0449  NA 138 136  K 3.7 3.9  CL 106 102  CO2 22 25  GLUCOSE 121* 89  BUN 9 15  CREATININE 0.62 0.62  CALCIUM 8.5* 8.8*    PT/INR: No results for input(s): LABPROT, INR in the last 72 hours. ABG    Component Value Date/Time   PHART 7.347 (L) 04/02/2019 0032   HCO3 30.5 (H) 04/02/2019 0032   TCO2 32 04/02/2019 0032   O2SAT 100.0 04/02/2019 0032   CBG (last 3)  No results for input(s): GLUCAP in the last 72 hours.  Assessment/Plan: S/P Procedure(s) (LRB): TRANSESOPHAGEAL ECHOCARDIOGRAM (TEE) (N/A) BUBBLE STUDY  1. CV-BP well controlled. Remains in sinus tachycardiaat times.  2. Pulm-tolerating room air with good oxygen saturation. CT scan of the chest  reviewed. 3. Renal-creatinine 0.62, electrolytes ok 4. H and H stable 10.8/34.3 5. Neuro-Hx of ischemic left MCA stroke, working with SLP.Brilinta stopped for now due to upcoming surgery.  6. Polysubstance abuse, detoxing while in the hospital. 7. IV Vancomycin and Rocephin for 6 weeks. Last dose 05/17/2019. Pharmacy to update patient if this will change with surgery and if she can convert to PO to go home.  8. Hx of panic attacks and anxiety. On Buspar Seroquel and Lexapro. Ativan PRN.  Plan: To the OR on Tuesday. Pre-op labs ordered for Monday per Dr. Dennie Maizes request. Echocardiogram reviewed with Dr. Tyrone Sage today. Patient will still need intervention on aortic and tricuspid valves due to severe regurgitation. Potentially may need to repair/replace the mitral valve since there is moderate to severe regurgitation.     LOS: 23 days    [name] 04/24/2019

## 2019-04-24 NOTE — Progress Notes (Signed)
Pharmacy Antibiotic Note  Lauren Reyes is a 29 y.o. female admitted on 04/01/2019 as a code stroke with aphasia and right-sided weakness. Pharmacy has been consulted for vancomycin dosing.  TEE on 3/30 revealed severe endocarditis involving aortic, mitral, and tricuspid valves with severe aortic regurgitation. Patient's condition is further complicated by septic emboli to her brain, lungs, and spleen. Respiratory culture grew MSSA, however, recent blood cultures are negative. Appears to have received at least one dose of cefazolin prior to culture draw, but will plan to treat with vancomycin and ceftriaxone to cover MSSA and other causes of culture-negative endocarditis.  Lauren Reyes is afebrile with WBC 4.2. SCr is stable at 0.62. A vancomycin trough this morning was therapeutic (VT 17 mcg/ml, goal of 15-20 mcg/ml). Will continue current dosing.  Noted that doses are being adjusted on troughs alone due to concern of cerebritis.   Plan: - Continue Vancomycin at 750 mg IV q 8 hours  - Consider weekly checks while renal function remains stable - Continue ceftriaxone IV 2g q12 per ID - Monitor renal function, clinical improvement, WBC, and temperature - End date for antibiotics: 05/17/19  Height: 5\' 5"  (165.1 cm) Weight: 72.3 kg (159 lb 6.3 oz) IBW/kg (Calculated) : 57  Temp (24hrs), Avg:98.2 F (36.8 C), Min:97.8 F (36.6 C), Max:98.4 F (36.9 C)  Recent Labs  Lab 04/18/19 0610 04/18/19 0749 04/20/19 0843 04/22/19 1044 04/24/19 0449 04/24/19 1130  WBC 3.5*  --  4.3 3.4* 4.2  --   CREATININE 0.63  --  0.65 0.62 0.62  --   VANCOTROUGH  --  16  --   --   --  17    Estimated Creatinine Clearance: 102.4 mL/min (by C-G formula based on SCr of 0.62 mg/dL).    No Known Allergies  Antimicrobials this admission: Zosyn 3/25 >> 3/25 Cefepime 3/25 >> 3/27 Ancef 3/27 >> 3/30 Vanc 3/25 >> 3/27; restarted 3/30 >> (5/9) Unasyn 3/30>>3/31 Ceftriaxone 3/31>>(5/9)  Microbiology  results: 3/24 COVID/Flu >> neg 3/25 RCx >> moderate MSSA 3/25 MRSA PCR >> negative 3/25 BCx >> negative   Thank you for allowing pharmacy to be a part of this patient's care.  4/25, PharmD, BCPS Clinical Pharmacist Clinical phone for 04/24/2019: 04/26/2019 04/24/2019 12:37 PM   **Pharmacist phone directory can now be found on [email or web address] ([email or web address]).  Listed under [facility].

## 2019-04-24 NOTE — TOC Progression Note (Signed)
Transition of Care [facility]) - Progression Note    Patient Details  Name: Lauren Reyes MRN: [id number] Date of Birth: 17-Feb-1990  Transition of Care [facility]) CM/SW Contact  Pollie Friar, RN Phone Number: 04/24/2019, 4:16 PM  Clinical Narrative:    CM met with the patient again to see if she has reached out to [facility] about drug counseling after her hospitalization. She states she has not called them. CM encouraged her to reach out to see what their process is and to see if she would be able to attend through their program. CM reminded her that Dr Servando Snare wants her to have a plan in place prior to surgery.  TOC following.   Expected Discharge Plan: Home/Self Care    Expected Discharge Plan and Services Expected Discharge Plan: Home/Self Care In-house Referral: Clinical Social Work Discharge Planning Services: CM Consult                                           Social Determinants of Health (SDOH) Interventions    Readmission Risk Interventions No flowsheet data found.

## 2019-04-25 MED ORDER — ATORVASTATIN CALCIUM 80 MG PO TABS
80.0000 mg | ORAL_TABLET | Freq: Every day | ORAL | Status: DC
  Administered 2019-04-25 – 2019-04-26 (×2): 80 mg via ORAL
  Filled 2019-04-25: qty 1

## 2019-04-25 MED ORDER — ATORVASTATIN CALCIUM 80 MG PO TABS: 80.0000 mg | ORAL_TABLET | Freq: Every day | ORAL | Status: DC

## 2019-04-25 NOTE — Progress Notes (Signed)
Physical Therapy Treatment Patient Details Name: Lauren Reyes MRN: [id number] DOB: 1990-12-17 Today's Date: 04/25/2019    History of Present Illness Pt is 29 year old female who was admitted with suspected IV drug abuse was being interviewed by police and developed right-sided facial weakness, aphasia, and R weakness.  She was brought by EMS to the emergency room work-up revealed occluded left MCA. Revascularization performed by IR. CT head and neck demonstrate multiple small cavitary abscesses in the upper lobes bilaterally. IVC filter placement for RLE DVT on 3/26. Pt on antibiotics for multivalvular endocarditis.  Cardiothoracic surgery have tentative plans to go forward with AVR and possible TV repair/replacement early next week.    PT Comments    Pt in bed upon arrival of PT, agreeable to PT session with focus on progressing ambulation and dynamic stability. The pt was able to demo good improvement in ambulation distance without need for AD and minG for safety. The pt did have 2 episodes of desat from mid 90s to 86% while walking on RA, but increased back to 94% within 15 sec of standing rest. The pt will continue to benefit from skilled PT to progress functional exercises to general LE as well as to progress dynamic stability.     Follow Up Recommendations  Outpatient PT     Equipment Recommendations  None recommended by PT    Recommendations for Other Services       Precautions / Restrictions Precautions Precautions: Fall Precaution Comments: impulsive Restrictions Weight Bearing Restrictions: No    Mobility  Bed Mobility Overal bed mobility: Independent Bed Mobility: Supine to Sit;Sit to Supine     Supine to sit: Modified independent (Device/Increase time) Sit to supine: Modified independent (Device/Increase time)   General bed mobility comments: Mod I for increased time and use of bedrail.  Transfers Overall transfer level: Independent Equipment used:  None Transfers: Sit to/from Stand Sit to Stand: Modified independent (Device/Increase time)         General transfer comment: pt stood in bathroom without supervision or AD without LOB, supervision for safety with lines  Ambulation/Gait Ambulation/Gait assistance: Modified independent (Device/Increase time) Gait Distance (Feet): 500 Feet Assistive device: None Gait Pattern/deviations: Step-through pattern;Drifts right/left Gait velocity: decr Gait velocity interpretation: 1.31 - 2.62 ft/sec, indicative of limited community ambulator General Gait Details: mild drifting and decreased cadence; no LOB    Stairs             Wheelchair Mobility    Modified Rankin (Stroke Patients Only) Modified Rankin (Stroke Patients Only) Pre-Morbid Rankin Score: No symptoms Modified Rankin: Moderate disability     Balance Overall balance assessment: Needs assistance Sitting-balance support: Feet supported Sitting balance-Leahy Scale: Good     Standing balance support: No upper extremity supported Standing balance-Leahy Scale: Good Standing balance comment: tolerates mild-moderate challenge to gait                            Cognition Arousal/Alertness: Awake/alert Behavior During Therapy: WFL for tasks assessed/performed Overall Cognitive Status: Within Functional Limits for tasks assessed                                        Exercises      General Comments        Pertinent Vitals/Pain Pain Assessment: No/denies pain Pain Descriptors / Indicators: Restless Pain Intervention(s): Limited activity within  patient's tolerance;Monitored during session    Home Living                      Prior Function            PT Goals (current goals can now be found in the care plan section) Acute Rehab PT Goals Patient Stated Goal: 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 PT Goal Formulation: With patient/family Time For Goal  Achievement: 05/06/19 Potential to Achieve Goals: Good Additional Goals Additional Goal #1: Pt will increase DGI score to 24/24 to continue to progress to baseline mobilty and safety level Progress towards PT goals: Progressing toward goals    Frequency    Min 4X/week      PT Plan Current plan remains appropriate    Co-evaluation              AM-PAC PT "6 Clicks" Mobility   Outcome Measure  Help needed turning from your back to your side while in a flat bed without using bedrails?: None Help needed moving from lying on your back to sitting on the side of a flat bed without using bedrails?: None Help needed moving to and from a bed to a chair (including a wheelchair)?: None Help needed standing up from a chair using your arms (e.g., wheelchair or bedside chair)?: None Help needed to walk in hospital room?: A Little Help needed climbing 3-5 steps with a railing? : A Little 6 Click Score: 22    End of Session Equipment Utilized During Treatment: Gait belt Activity Tolerance: Patient tolerated treatment well Patient left: in bed;with call bell/phone within reach Nurse Communication: Mobility status PT Visit Diagnosis: Other abnormalities of gait and mobility (R26.89);Unsteadiness on feet (R26.81);Difficulty in walking, not elsewhere classified (R26.2)     Time: 7253-6644 PT Time Calculation (min) (ACUTE ONLY): 21 min  Charges:  $Gait Training: 8-22 mins                     Lauren Reyes, PT, DPT   Acute Rehabilitation Department Pager #: [phone]   Lauren Reyes 04/25/2019, 5:28 PM

## 2019-04-25 NOTE — Progress Notes (Signed)
      [address]       [place] [address]             [phone]      18 Days Post-Op Procedure(s) (LRB): TRANSESOPHAGEAL ECHOCARDIOGRAM (TEE) (N/A) BUBBLE STUDY   Subjective:  No new complaints  Objective: Vital signs in last 24 hours: Temp:  [97.6 F (36.4 C)-98.8 F (37.1 C)] 98.8 F (37.1 C) (04/17 0824) Pulse Rate:  [105-113] 113 (04/17 1053) Cardiac Rhythm: Sinus tachycardia (04/17 0824) Resp:  [18-30] 18 (04/17 0824) BP: (102-124)/(42-61) 119/55 (04/17 1053) SpO2:  [95 %-100 %] 100 % (04/17 0824)  Intake/Output from previous day: 04/16 0701 - 04/17 0700 In: -  Out: 750 [Urine:750]  General appearance: alert, cooperative and no distress Heart: regular rate and rhythm and + murmur Lungs: clear to auscultation bilaterally  Lab Results: Recent Labs    04/24/19 0449  WBC 4.2  HGB 10.8*  HCT 34.3*  PLT 292   BMET:  Recent Labs    04/24/19 0449  NA 136  K 3.9  CL 102  CO2 25  GLUCOSE 89  BUN 15  CREATININE 0.62  CALCIUM 8.8*    PT/INR: No results for input(s): LABPROT, INR in the last 72 hours. ABG    Component Value Date/Time   PHART 7.347 (L) 04/02/2019 0032   HCO3 30.5 (H) 04/02/2019 0032   TCO2 32 04/02/2019 0032   O2SAT 100.0 04/02/2019 0032   CBG (last 3)  No results for input(s): GLUCAP in the last 72 hours.  Assessment/Plan: S/P Procedure(s) (LRB): TRANSESOPHAGEAL ECHOCARDIOGRAM (TEE) (N/A) BUBBLE STUDY  1. CV- stable, mild tachycardia at times 2. ID- Endocarditis on ABX per ID 3. IV Drug abuse- currently detoxing while in hospital, post discharge plans in process 4. Dispo- patient stable, tentatively for OR Tuesday with Dr. Tyrone Sage   LOS: 24 days    Lowella Dandy, PA-C  04/25/2019

## 2019-04-25 NOTE — Progress Notes (Signed)
PROGRESS NOTE    Lauren Reyes  WUX:[id number] DOB: [birth year] DOA: 04/01/2019 PCP: Patient, No Pcp Per   Brief Narrative: Lauren Wanjiku Ruchugois a 29 y.o.femalewith unknown past medical history, possible heroin abuse presents to the emergency department as a code stroke with aphasia and right-sided weakness.  [employer] called to the hotel room where she was staying as she had overstated her visit,patient was Found by PD talking on the phone and suddenly started to slur her speech, the right facial droop and had difficulty talking. When EMS arrived patient still communicating but slowly progressively got worse.   Patient was admitted to neurology ICU intubated after Thrombectomy She underwent thrombectomy for Left MCA m3 occlusion with recanalization complicated by dissection/pseudoanerusym of left ICA s/p stent. She was extubated on March 27.   This morning nursing communicated that the patient had increased heart rate and respiratory rate. This has been an issue for many days. EKG demonstrated only sinus tachycardia. This is assumed to be due to deconditioning or withdrawal, probably from benzodiazepines for which the patient was positive on admission. Ativan has been made available on as needed basis and she has been prescribed scheduled Buspar. This seems to have helped her anxiety.  CTS has been consulted and has stated that the patient is not currently a candidate for surgery. However, they do state that surgery must be considered if the patient deteriorates acutely. Infectious disease is consulted. They have recommended continuing Vancomycin and ceftriaxone for a total of 6 weeks. The last dose is to be given on 05/17/2019. IV antibiotics must be given in a supervised setting due to history of polysubstance abuse and IVDU.  This morning the patient is tachypneic and short of breath. D Dimer has been ordered and was elevated at 3.55. CTA of chest has been ordered to  rule out pulmonary embolus. No pulmonary embolus was seen. However, the patient's CTA did demonstrate mild  progression of cavitary bronchopneumonia with nodules and septic pulmonary emboli . There was also some mild pulmonary edema with new moderate sized layering bilateral pleural effusion with new compressive atelectasis.  Cardiology had signed off as there was no medical therapy other than continued antibiotics to address the patient's multivalvular endocarditis. However CTS has now been called back. They tentatively have decided to go forward with plans for AVR and possible TV repair/replacement early next week. Her brillinta has been stopped for this reason. The patient will need to have a plan in place for substance abuse rehab as outpatient before she goes to surgery.  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  Septic emboli have been seen in left hemisphere of brain, in lung, spleen with possible splenic and kidney infarcts. Initially the patient was treated with Vancomycin and Unasyn. She is currently receiving Vancomycin and Rocephin. Her fever has resolved. Per infectious disease the patient will require 6 weeks total of this regimen with her last dose  being 05/17/2019. WBC has declined from 29+ to 4.2 today. The patient has been evaluated by thoracic surgery, Dr. Servando Snare: patient eventually will require aortic valve  replacement, however this is complicated by recent stroke and recent stent on aspirin and Brilinta, as well as a recent decline in mental status.  Per Dr. Pia Mau, surgery should be reconsidered should the patient developed worsening of heart failure syndrome and she has to be off Brilinta for 7 days. However, the patient was re-evaluated by CTS yesterday. They have stopped her Brillinta and plan for AVR and possible TV repair or replacement early next week. Cardiology has signed off. Other than antibiotics, there is no medical management available to address the patient's severe multi valvular endocarditis.  Dyspnea: Improved. CTS demonstrated pulmonary edema and pleural effusions with compressive atelectasis. The patient has been started on lasix. CT chest also demonstrated mild  progression of cavitary bronchopneumonia with nodules and septic pulmonary emboli . Patient denies shortness of breath. No PE was seen on CTA chest. She is currently saturating in the mid nineties on room air. Monitor volume status.  CVA: The patient has had a small infarct of left MCA territory. TPA was deferred as concern for septic emboli. The patient underwent a leftcommon carotid arteriogram followed by complete revascularization of occluded M3 region of inferior division ofleft MCA . She is also s/p placement of a 4.5 mm x 92m pipeline flow diverter at site of focal dissection associated with a small pseudoaneurysm of distal cervical LT ICA. She has been continued on ASA and Brillinta. A1c is 5.9, VLDL was 52. She is on lipitor 80 mg daily. CXR - no acute changes. D Dimer: elevated at 3.55. CTA chest:  No PE,  Hypokalemia: Resolved. Monitor.  Dyslipidemia: With low HDL less than 10, high VLDL at 52, high triglyceride at 258. She is on Lipitor.  Sinus tachycardia/ Tachypnea: Drug withdrawal vs deconditioning. The patient has been started on as needed ativan.TSH 0.8. She is also receiving scheduled low-dose  Lopressor with holding parameters. Cardiology has advised caution with any increase in dose of beta blocker due to the patient's severe AR.  Right common femoral vein DVT: Not a candidate for anticoagulation due to large right groin hematoma post procedure and endocarditis. She has undergone placement of IVC filter on March 26 by IR.  Large right groin hematoma post procedure: Pseudoaneurysm addressed with S/P 46F angioseal for hemostasisat therightCFA access site. CBC stable with hemoglobin of 9.9.  Blood loss anemia, likely from right groin hematoma. Status post PRBC transfusion x2 on March 27. GI Dr GPenelope Coopconsulted on March 27 who do not think patient has any signs of GI bleed. Monitor CBC. Currently stable at 10.8  B12 deficiency: Supplement.  Hyponatremia: Present on admission. Now resolved. Monitor.  Hepatitis C:  Antibody positive, viral load negative. HIV screening negative RPR screening negative  Polysubstance abuse: UDS on presentation positive for THC amphetamine. Suspect IV drug use with heroin, multiple needle marks bilateral upper extremities. Patient state IV drug use ongoing for many years now she is wanting to quit. Positive for benzodiazepines on 04/12/2019. Pt must complete her IV antibiotics in a supervised setting for this reason. She will need to have a plan in place for substance abuse rehab as outpatient before she goes to surgery.  Anxiety versus drug withdrawal:  Positive for benzodiazepines on admission. She has an episode of crying,acting erratic on [place] 4 a.m. Appears to be responding to as needed Ativan. UDS positive for benzodiazepines  on 04/12/2019. I have also started scheduled Buspar.   4:47 PM on 04/12/2019 RN report patient become more confused with increased slurred speech Stat CT head demonstrated evolving areas of recent infarction better seen on prior MRI. Petechial hemorrhage is not visible. There is no discrete hematoma. No acute interval changes.    Tachycardia: Due to deconditioning, vs response to acute illness vs CMO due to polysubstance abuse  Debility: Per PT she needs assistance for bed mobility and transfers. She needs supervision by one person hand-held for ambulation and gait. She was able to ambulate 80 feet on [place] 2nd. Gait Pattern/deviations: Step-through pattern;Decreased stride length;Wide base of support;Trunk flexed . General Gait Details: Mild unsteadiness, pt reaching for furniture required HHA to improve balance. HR 110 bpm at it's highest.    DVT Prophylaxis:scd's Code Status:full Family Communication:none present Disposition Plan:patient is from home. Discharge SNF vs home. Barriers to discharge. The patient will need to complete her 6 weeks of IV Vancomycin and Rocephin as inpatient for safety reasons due to history of IVDU. Her last dose will be on 05/17/2019. She has infective endocarditis and septic emboli due to IV drug abuse. She also has no insurance although she may be able to get Medicaid. The patient has been evaluated again by CTS. They plan to take the patient for surgery early next week. The patient needs referral to rehab before she can go to surgery.     Consultants:   Infectious disease  Cardiology  Cardiothoracic surgery  Procedures: Antimicrobials:. Anti-infectives (From admission, onward)   Start     Dose/Rate Route Frequency Ordered Stop   04/15/19 0900  vancomycin (VANCOREADY) IVPB 750 mg/150 mL     750 mg 150 mL/hr over 60 Minutes Intravenous Every 8 hours 04/15/19 0821     04/13/19 1400  vancomycin (VANCOCIN) IVPB 1000 mg/200 mL premix  Status:  Discontinued     1,000 mg 200 mL/hr over 60 Minutes Intravenous Every 8 hours 04/13/19 0839 04/15/19 0821   04/12/19 2200  vancomycin (VANCOREADY) IVPB 750 mg/150 mL  Status:  Discontinued     750 mg 150 mL/hr over 60 Minutes Intravenous Every 8 hours 04/12/19 1500 04/13/19 0839   04/10/19 1336  vancomycin (VANCOCIN) IVPB 1000  mg/200 mL premix  Status:  Discontinued     1,000 mg 200 mL/hr over 60 Minutes Intravenous Every 8 hours 04/10/19 1320 04/12/19 1500   04/08/19 1200  cefTRIAXone (ROCEPHIN) 2 g in sodium chloride 0.9 % 100 mL IVPB     2 g 200 mL/hr over 30 Minutes Intravenous Every 12 hours 04/08/19 1013 05/17/19 2359   04/08/19 0530  vancomycin (VANCOCIN) IVPB 1000 mg/200 mL premix  Status:  Discontinued     1,000 mg 200 mL/hr over 60 Minutes Intravenous Every 12 hours 04/07/19 1541 04/10/19 1320   04/07/19 1900  Ampicillin-Sulbactam (UNASYN) 3 g in sodium chloride 0.9 % 100 mL IVPB  Status:  Discontinued     3 g 200 mL/hr over 30 Minutes Intravenous Every 6 hours 04/07/19 1457 04/08/19 1013   04/07/19 1730  vancomycin (VANCOREADY) IVPB 1500 mg/300 mL     1,500 mg 150 mL/hr over 120 Minutes Intravenous  Once 04/07/19 1541 04/07/19 1937   04/04/19 1400  ceFAZolin (ANCEF) IVPB 2g/100 mL premix  Status:  Discontinued     2 g 200 mL/hr over 30 Minutes Intravenous Every 8 hours 04/04/19 1119 04/07/19 1457   04/04/19 0400  Vancomycin (VANCOCIN) 1,250 mg in sodium chloride 0.9 % 250  mL IVPB  Status:  Discontinued     1,250 mg 166.7 mL/hr over 90 Minutes Intravenous Every 12 hours 04/03/19 1635 04/04/19 1159   04/02/19 1800  ceFEPIme (MAXIPIME) 2 g in sodium chloride 0.9 % 100 mL IVPB  Status:  Discontinued     2 g 200 mL/hr over 30 Minutes Intravenous Every 8 hours 04/02/19 1218 04/04/19 1127   04/02/19 1600  vancomycin (VANCOCIN) IVPB 1000 mg/200 mL premix  Status:  Discontinued     1,000 mg 200 mL/hr over 60 Minutes Intravenous Every 12 hours 04/02/19 0100 04/03/19 1635   04/02/19 0200  piperacillin-tazobactam (ZOSYN) IVPB 3.375 g  Status:  Discontinued     3.375 g 12.5 mL/hr over 240 Minutes Intravenous Every 8 hours 04/02/19 0059 04/02/19 1219   04/02/19 0200  vancomycin (VANCOCIN) 1,500 mg in sodium chloride 0.9 % 500 mL IVPB     1,500 mg 250 mL/hr over 120 Minutes Intravenous  Once 04/02/19 0100  04/02/19 0923   04/01/19 2040  ceFAZolin (ANCEF) 2-4 GM/100ML-% IVPB    Note to Pharmacy: [name]   : cabinet override      04/01/19 2040 04/02/19 0844       Subjective: Patient was seen and examined at bedside.  She was sitting comfortably denies any concerns.  She is aware that she has to get antibiotics until May 17, 2019.  No overnight events.  Objective: Vitals:   04/25/19 0420 04/25/19 0605 04/25/19 0824 04/25/19 1053  BP: (!) 111/46  (!) 104/46 (!) 119/55  Pulse: (!) 106  (!) 106 (!) 110  Resp: (!) '22 20 18   ' Temp: 98.4 F (36.9 C)  98.8 F (37.1 C)   TempSrc: Oral  Oral   SpO2: 100%  100%   Weight:      Height:        Intake/Output Summary (Last 24 hours) at 04/25/2019 1126 Last data filed at 04/25/2019 0344 Gross per 24 hour  Intake --  Output 750 ml  Net -750 ml   Filed Weights   04/01/19 1900 04/01/19 2023  Weight: 72.3 kg 72.3 kg    Examination:  General exam: Appears calm and comfortable  Respiratory system: Clear to auscultation. Respiratory effort normal. Cardiovascular system: S1 & S2 heard, RRR. No JVD, murmurs, rubs, gallops or clicks. No pedal edema. Gastrointestinal system: Abdomen is nondistended, soft and nontender. No organomegaly or masses felt. Normal bowel sounds heard. Central nervous system: Alert and oriented. No focal neurological deficits. Extremities: Symmetric 5 x 5 power. Skin: No rashes, lesions or ulcers Psychiatry: Judgement and insight appear normal. Mood & affect appropriate.     Data Reviewed: I have personally reviewed following labs and imaging studies  CBC: Recent Labs  Lab 04/20/19 0843 04/22/19 1044 04/24/19 0449  WBC 4.3 3.4* 4.2  HGB 11.3* 11.0* 10.8*  HCT 36.1 35.0* 34.3*  MCV 87.2 87.3 87.1  PLT 466* 347 161   Basic Metabolic Panel: Recent Labs  Lab 04/20/19 0843 04/22/19 1044 04/24/19 0449  NA 139 138 136  K 4.4 3.7 3.9  CL 106 106 102  CO2 '22 22 25  ' GLUCOSE 86 121* 89  BUN '7 9 15   ' CREATININE 0.65 0.62 0.62  CALCIUM 9.0 8.5* 8.8*  MG  --   --  1.8  PHOS  --   --  3.5   GFR: Estimated Creatinine Clearance: 102.4 mL/min (by C-G formula based on SCr of 0.62 mg/dL). Liver Function Tests: No results for input(s): AST,  ALT, ALKPHOS, BILITOT, PROT, ALBUMIN in the last 168 hours. No results for input(s): LIPASE, AMYLASE in the last 168 hours. No results for input(s): AMMONIA in the last 168 hours. Coagulation Profile: No results for input(s): INR, PROTIME in the last 168 hours. Cardiac Enzymes: No results for input(s): CKTOTAL, CKMB, CKMBINDEX, TROPONINI in the last 168 hours. BNP (last 3 results) No results for input(s): PROBNP in the last 8760 hours. HbA1C: No results for input(s): HGBA1C in the last 72 hours. CBG: No results for input(s): GLUCAP in the last 168 hours. Lipid Profile: No results for input(s): CHOL, HDL, LDLCALC, TRIG, CHOLHDL, LDLDIRECT in the last 72 hours. Thyroid Function Tests: No results for input(s): TSH, T4TOTAL, FREET4, T3FREE, THYROIDAB in the last 72 hours. Anemia Panel: No results for input(s): VITAMINB12, FOLATE, FERRITIN, TIBC, IRON, RETICCTPCT in the last 72 hours. Sepsis Labs: No results for input(s): PROCALCITON, LATICACIDVEN in the last 168 hours.  Recent Results (from the past 240 hour(s))  Surgical PCR screen     Status: None   Collection Time: 04/24/19  6:56 PM   Specimen: Nasal Mucosa; Nasal Swab  Result Value Ref Range Status   MRSA, PCR NEGATIVE NEGATIVE Final   Staphylococcus aureus NEGATIVE NEGATIVE Final    Comment: (NOTE) The Xpert SA Assay (FDA approved for NASAL specimens in patients 89 years of age and older), is one component of a comprehensive surveillance program. It is not intended to diagnose infection nor to guide or monitor treatment. Performed at [facility], [place] [address], [place], [place] [address]       Radiology Studies: No results found.   Scheduled Meds: . aspirin  81 mg Oral  Daily  . atorvastatin  80 mg Per Tube q1800  . busPIRone  7.5 mg Oral BID  . chlorhexidine gluconate (MEDLINE KIT)  15 mL Mouth Rinse BID  . Chlorhexidine Gluconate Cloth  6 each Topical Daily  . escitalopram  10 mg Oral Daily  . furosemide  20 mg Intravenous Q12H  . metoprolol tartrate  12.5 mg Oral BID  . mupirocin ointment  1 application Nasal BID  . pneumococcal 23 valent vaccine  0.5 mL Intramuscular Tomorrow-1000  . potassium chloride  20 mEq Oral Daily  . QUEtiapine  25 mg Oral QHS  . sodium chloride flush  10-40 mL Intracatheter Q12H  . vitamin B-12  1,000 mcg Oral Daily   Continuous Infusions: . sodium chloride 10 mL/hr at 04/15/19 2255  . cefTRIAXone (ROCEPHIN)  IV 2 g (04/25/19 1058)  . vancomycin 750 mg (04/25/19 0429)     LOS: 24 days    Time spent: 25 mins    Justinian Miano, MD [facility]   If 7PM-7AM, please contact night-coverage

## 2019-04-26 LAB — CBC
HCT: 34.5 % — ABNORMAL LOW (ref 36.0–46.0)
Hemoglobin: 10.8 g/dL — ABNORMAL LOW (ref 12.0–15.0)
MCH: 27.1 pg (ref 26.0–34.0)
MCHC: 31.3 g/dL (ref 30.0–36.0)
MCV: 86.5 fL (ref 80.0–100.0)
Platelets: 276 10*3/uL (ref 150–400)
RBC: 3.99 MIL/uL (ref 3.87–5.11)
RDW: 18.6 % — ABNORMAL HIGH (ref 11.5–15.5)
WBC: 4 10*3/uL (ref 4.0–10.5)
nRBC: 0 % (ref 0.0–0.2)

## 2019-04-26 LAB — BASIC METABOLIC PANEL
Anion gap: 9 (ref 5–15)
BUN: 12 mg/dL (ref 6–20)
CO2: 24 mmol/L (ref 22–32)
Calcium: 8.7 mg/dL — ABNORMAL LOW (ref 8.9–10.3)
Chloride: 104 mmol/L (ref 98–111)
Creatinine, Ser: 0.6 mg/dL (ref 0.44–1.00)
GFR calc Af Amer: >60 mL/min (ref >60)
GFR calc non Af Amer: >60 mL/min (ref >60)
Glucose, Bld: 87 mg/dL (ref 70–99)
Potassium: 3.7 mmol/L (ref 3.5–5.1)
Sodium: 137 mmol/L (ref 135–145)

## 2019-04-26 LAB — PHOSPHORUS: Phosphorus: 3.8 mg/dL (ref 2.5–4.6)

## 2019-04-26 LAB — MAGNESIUM: Magnesium: 1.7 mg/dL (ref 1.7–2.4)

## 2019-04-26 MED ORDER — FUROSEMIDE 10 MG/ML IJ SOLN
40.0000 mg | Freq: Every day | INTRAMUSCULAR | Status: DC
  Administered 2019-04-26 – 2019-04-27 (×2): 40 mg via INTRAVENOUS
  Filled 2019-04-26 (×2): qty 4

## 2019-04-26 NOTE — Progress Notes (Signed)
PROGRESS NOTE    Lauren Reyes  AYT:[id number] DOB: [birth year] DOA: 04/01/2019 PCP: Patient, No Pcp Per   Brief Narrative: Minela Wanjiku Ruchugois a 29 y.o.femalewith unknown past medical history, possible heroin abuse presents to the emergency department as a code stroke with aphasia and right-sided weakness.  [place] PD called to the hotel room where she was staying as she had overstated her visit,patient was Found by PD talking on the phone and suddenly started to slur her speech, the right facial droop and had difficulty talking. When EMS arrived patient still communicating but slowly progressively got worse.   Patient was admitted to neurology ICU intubated after Thrombectomy She underwent thrombectomy for Left MCA m3 occlusion with recanalization complicated by dissection/pseudoanerusym of left ICA s/p stent. She was extubated on March 27.   This morning nursing communicated that the patient had increased heart rate and respiratory rate. This has been an issue for many days. EKG demonstrated only sinus tachycardia. This is assumed to be due to deconditioning or withdrawal, probably from benzodiazepines for which the patient was positive on admission. Ativan has been made available on as needed basis and she has been prescribed scheduled Buspar. This seems to have helped her anxiety.  CTS has been consulted and has stated that the patient is not currently a candidate for surgery. However, they do state that surgery must be considered if the patient deteriorates acutely. Infectious disease is consulted. They have recommended continuing Vancomycin and ceftriaxone for a total of 6 weeks. The last dose is to be given on 05/17/2019. IV antibiotics must be given in a supervised setting due to history of polysubstance abuse and IVDU.  This morning the patient is tachypneic and short of breath. D Dimer has been ordered and was elevated at 3.55. CTA of chest has been ordered to  rule out pulmonary embolus. No pulmonary embolus was seen. However, the patient's CTA did demonstrate mild  progression of cavitary bronchopneumonia with nodules and septic pulmonary emboli . There was also some mild pulmonary edema with new moderate sized layering bilateral pleural effusion with new compressive atelectasis.  Cardiology had signed off as there was no medical therapy other than continued antibiotics to address the patient's multivalvular endocarditis. However CTS has now been called back. They tentatively have decided to go forward with plans for AVR and possible TV repair/replacement early next week. Her brillinta has been stopped for this reason. The patient will need to have a plan in place for substance abuse rehab as outpatient before she goes to surgery.  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  Septic emboli have been seen in left hemisphere of brain, in lung, spleen with possible splenic and kidney infarcts. Initially the patient was treated with Vancomycin and Unasyn. She is currently receiving Vancomycin and Rocephin. Her fever has resolved. Per infectious disease the patient will require 6 weeks total of this regimen with her last dose  being 05/17/2019. WBC has declined from 29+ to 4.2 today. The patient has been evaluated by thoracic surgery, Dr. Servando Snare: patient eventually will require aortic valve  replacement, however this is complicated by recent stroke and recent stent on aspirin and Brilinta, as well as a recent decline in mental status.  Per Dr. Pia Mau, surgery should be reconsidered should the patient developed worsening of heart failure syndrome and she has to be off Brilinta for 7 days. However, the patient was re-evaluated by CTS yesterday. They have stopped her Brillinta and plan for AVR and possible TV repair or replacement early next week. Cardiology has signed off. Other than antibiotics, there is no medical management available to address the patient's severe multi valvular endocarditis.  Dyspnea:->>>Resolved. CT demonstrated pulmonary edema and pleural effusions with compressive atelectasis. The patient has been started on lasix. CT chest also demonstrated mild  progression of cavitary bronchopneumonia with nodules and septic pulmonary emboli . Patient denies shortness of breath. No PE was seen on CTA chest. She is currently saturating in the mid nineties on room air. Monitor volume status.  CVA: The patient has had a small infarct of left MCA territory. TPA was deferred as concern for septic emboli. The patient underwent a leftcommon carotid arteriogram followed by complete revascularization of occluded M3 region of inferior division ofleft MCA . She is also s/p placement of a 4.5 mm x 71m pipeline flow diverter at site of focal dissection associated with a small pseudoaneurysm of distal cervical LT ICA. She has been continued on ASA and Brillinta. A1c is 5.9, VLDL was 52. She is on lipitor 80 mg daily. CXR - no acute changes. D Dimer: elevated at 3.55. CTA chest:  No PE,  Hypokalemia: Resolved. Monitor.  Dyslipidemia: With low HDL less than 10, high VLDL at 52, high triglyceride at 258. She is on Lipitor.  Sinus tachycardia/ Tachypnea: Drug withdrawal vs deconditioning. The patient has been started on as needed ativan.TSH 0.8. She is also receiving scheduled low-dose  Lopressor with holding parameters. Cardiology has advised caution with any increase in dose of beta blocker due to the patient's severe AR.  Right common femoral vein DVT: Not a candidate for anticoagulation due to large right groin hematoma post procedure and endocarditis. She has undergone placement of IVC filter on March 26 by IR.  Large right groin hematoma post procedure: Pseudoaneurysm addressed with S/P 82F angioseal for hemostasisat therightCFA access site. CBC stable with hemoglobin of 9.9.  Blood loss anemia, likely from right groin hematoma. Status post PRBC transfusion x2 on March 27. GI Dr GPenelope Coopconsulted on March 27 who do not think patient has any signs of GI bleed. Monitor CBC. Currently stable at 10.8  B12 deficiency: Supplement  Hyponatremia: Present on admission. Now resolved. Monitor.  Hepatitis C:  Antibody positive, viral load negative. HIV screening negative RPR screening negative  Polysubstance abuse: UDS on presentation positive for THC amphetamine. Suspect IV drug use with heroin, multiple needle marks bilateral upper extremities. Patient state IV drug use ongoing for many years now she is wanting to quit. Positive for benzodiazepines on 04/12/2019. Pt must complete her IV antibiotics in a supervised setting for this reason. She will need to have a plan in place for substance abuse rehab as outpatient before she goes to surgery.  Anxiety versus drug withdrawal:  Positive for benzodiazepines on admission. She has an episode of crying,acting erratic on [place] 4 a.m. Appears to be responding to as needed Ativan. UDS positive for benzodiazepines on  04/12/2019. I have also started scheduled Buspar.    Debility: Per PT she needs assistance for bed mobility and transfers. She needs supervision by one person hand-held for ambulation and gait. She was able to ambulate 80 feet on [place] 2nd. Gait Pattern/deviations: Step-through pattern;Decreased stride length;Wide base  of support;Trunk flexed . General Gait Details: Mild unsteadiness, pt reaching for furniture required HHA to improve balance. HR 110 bpm at it's highest.    DVT Prophylaxis:scd's Code Status:full Family Communication:none present Disposition Plan:patient is from home. Discharge SNF vs home.  Barriers to discharge. The patient will need to complete her 6 weeks of IV Vancomycin and Rocephin as inpatient for safety reasons due to history of IVDU. Her last dose will be on 05/17/2019. She has infective endocarditis and septic emboli due to IV drug abuse. She also has no insurance although she may be able to get Medicaid. The patient has been evaluated again by CTS. They plan to take the patient for surgery early next week. The patient needs referral to rehab before she can go to surgery.   Consultants:   Infectious disease  Cardiology  Cardiothoracic surgery  Procedures: Antimicrobials:. Anti-infectives (From admission, onward)   Start     Dose/Rate Route Frequency Ordered Stop   04/15/19 0900  vancomycin (VANCOREADY) IVPB 750 mg/150 mL     750 mg 150 mL/hr over 60 Minutes Intravenous Every 8 hours 04/15/19 0821     04/13/19 1400  vancomycin (VANCOCIN) IVPB 1000 mg/200 mL premix  Status:  Discontinued     1,000 mg 200 mL/hr over 60 Minutes Intravenous Every 8 hours 04/13/19 0839 04/15/19 0821   04/12/19 2200  vancomycin (VANCOREADY) IVPB 750 mg/150 mL  Status:  Discontinued     750 mg 150 mL/hr over 60 Minutes Intravenous Every 8 hours 04/12/19 1500 04/13/19 0839   04/10/19 1336  vancomycin (VANCOCIN) IVPB 1000 mg/200 mL premix  Status:  Discontinued     1,000 mg 200 mL/hr over 60 Minutes Intravenous Every 8 hours 04/10/19 1320 04/12/19 1500   04/08/19 1200  cefTRIAXone (ROCEPHIN) 2 g in sodium chloride 0.9 % 100 mL IVPB     2 g 200 mL/hr over 30 Minutes Intravenous Every 12 hours 04/08/19 1013 05/17/19 2359   04/08/19 0530  vancomycin (VANCOCIN) IVPB 1000 mg/200 mL premix   Status:  Discontinued     1,000 mg 200 mL/hr over 60 Minutes Intravenous Every 12 hours 04/07/19 1541 04/10/19 1320   04/07/19 1900  Ampicillin-Sulbactam (UNASYN) 3 g in sodium chloride 0.9 % 100 mL IVPB  Status:  Discontinued     3 g 200 mL/hr over 30 Minutes Intravenous Every 6 hours 04/07/19 1457 04/08/19 1013   04/07/19 1730  vancomycin (VANCOREADY) IVPB 1500 mg/300 mL     1,500 mg 150 mL/hr over 120 Minutes Intravenous  Once 04/07/19 1541 04/07/19 1937   04/04/19 1400  ceFAZolin (ANCEF) IVPB 2g/100 mL premix  Status:  Discontinued     2 g 200 mL/hr over 30 Minutes Intravenous Every 8 hours 04/04/19 1119 04/07/19 1457   04/04/19 0400  Vancomycin (VANCOCIN) 1,250 mg in sodium chloride 0.9 % 250 mL IVPB  Status:  Discontinued     1,250 mg 166.7 mL/hr over 90 Minutes Intravenous Every 12 hours 04/03/19 1635 04/04/19 1159   04/02/19 1800  ceFEPIme (MAXIPIME) 2 g in sodium chloride 0.9 % 100 mL IVPB  Status:  Discontinued     2 g 200 mL/hr over 30 Minutes Intravenous Every 8 hours  04/02/19 1218 04/04/19 1127   04/02/19 1600  vancomycin (VANCOCIN) IVPB 1000 mg/200 mL premix  Status:  Discontinued     1,000 mg 200 mL/hr over 60 Minutes Intravenous Every 12 hours 04/02/19 0100 04/03/19 1635   04/02/19 0200  piperacillin-tazobactam (ZOSYN) IVPB 3.375 g  Status:  Discontinued     3.375 g 12.5 mL/hr over 240 Minutes Intravenous Every 8 hours 04/02/19 0059 04/02/19 1219   04/02/19 0200  vancomycin (VANCOCIN) 1,500 mg in sodium chloride 0.9 % 500 mL IVPB     1,500 mg 250 mL/hr over 120 Minutes Intravenous  Once 04/02/19 0100 04/02/19 0923   04/01/19 2040  ceFAZolin (ANCEF) 2-4 GM/100ML-% IVPB    Note to Pharmacy: [name]   : cabinet override      04/01/19 2040 04/02/19 0844       Subjective: Patient was seen and examined at bedside.  She was sitting comfortably,  denies any concerns.  She is aware that she has to get antibiotics until May 17, 2019.  No overnight  events.  Objective: Vitals:   04/26/19 0308 04/26/19 0414 04/26/19 0441 04/26/19 0757  BP: (!) 122/53   (!) 103/43  Pulse: (!) 107   (!) 108  Resp: '18 20 20 18  ' Temp: 98.8 F (37.1 C)   97.8 F (36.6 C)  TempSrc: Oral   Oral  SpO2: 98% 98% 97% 98%  Weight:      Height:        Intake/Output Summary (Last 24 hours) at 04/26/2019 1132 Last data filed at 04/26/2019 0310 Gross per 24 hour  Intake 240 ml  Output 650 ml  Net -410 ml   Filed Weights   04/01/19 1900 04/01/19 2023  Weight: 72.3 kg 72.3 kg    Examination:  General exam: Appears calm and comfortable  Respiratory system: Clear to auscultation. Respiratory effort normal. Cardiovascular system: S1 & S2 heard, RRR. No JVD, murmurs, rubs, gallops or clicks. No pedal edema. Gastrointestinal system: Abdomen is nondistended, soft and nontender. No organomegaly or masses felt. Normal bowel sounds heard. Central nervous system: Alert and oriented. No focal neurological deficits. Extremities: Symmetric 5 x 5 power. Skin: No rashes, lesions or ulcers Psychiatry: Judgement and insight appear normal. Mood & affect appropriate.     Data Reviewed: I have personally reviewed following labs and imaging studies  CBC: Recent Labs  Lab 04/20/19 0843 04/22/19 1044 04/24/19 0449 04/26/19 0530  WBC 4.3 3.4* 4.2 4.0  HGB 11.3* 11.0* 10.8* 10.8*  HCT 36.1 35.0* 34.3* 34.5*  MCV 87.2 87.3 87.1 86.5  PLT 466* 347 292 202   Basic Metabolic Panel: Recent Labs  Lab 04/20/19 0843 04/22/19 1044 04/24/19 0449 04/26/19 0530  NA 139 138 136 137  K 4.4 3.7 3.9 3.7  CL 106 106 102 104  CO2 '22 22 25 24  ' GLUCOSE 86 121* 89 87  BUN '7 9 15 12  ' CREATININE 0.65 0.62 0.62 0.60  CALCIUM 9.0 8.5* 8.8* 8.7*  MG  --   --  1.8 1.7  PHOS  --   --  3.5 3.8   GFR: Estimated Creatinine Clearance: 102.4 mL/min (by C-G formula based on SCr of 0.6 mg/dL). Liver Function Tests: No results for input(s): AST, ALT, ALKPHOS, BILITOT, PROT, ALBUMIN  in the last 168 hours. No results for input(s): LIPASE, AMYLASE in the last 168 hours. No results for input(s): AMMONIA in the last 168 hours. Coagulation Profile: No results for input(s): INR, PROTIME in the last 168  hours. Cardiac Enzymes: No results for input(s): CKTOTAL, CKMB, CKMBINDEX, TROPONINI in the last 168 hours. BNP (last 3 results) No results for input(s): PROBNP in the last 8760 hours. HbA1C: No results for input(s): HGBA1C in the last 72 hours. CBG: No results for input(s): GLUCAP in the last 168 hours. Lipid Profile: No results for input(s): CHOL, HDL, LDLCALC, TRIG, CHOLHDL, LDLDIRECT in the last 72 hours. Thyroid Function Tests: No results for input(s): TSH, T4TOTAL, FREET4, T3FREE, THYROIDAB in the last 72 hours. Anemia Panel: No results for input(s): VITAMINB12, FOLATE, FERRITIN, TIBC, IRON, RETICCTPCT in the last 72 hours. Sepsis Labs: No results for input(s): PROCALCITON, LATICACIDVEN in the last 168 hours.  Recent Results (from the past 240 hour(s))  Surgical PCR screen     Status: None   Collection Time: 04/24/19  6:56 PM   Specimen: Nasal Mucosa; Nasal Swab  Result Value Ref Range Status   MRSA, PCR NEGATIVE NEGATIVE Final   Staphylococcus aureus NEGATIVE NEGATIVE Final    Comment: (NOTE) The Xpert SA Assay (FDA approved for NASAL specimens in patients 50 years of age and older), is one component of a comprehensive surveillance program. It is not intended to diagnose infection nor to guide or monitor treatment. Performed at [facility], [address], [place], [place] [address]       Radiology Studies: No results found.   Scheduled Meds: . aspirin  81 mg Oral Daily  . atorvastatin  80 mg Oral q1800  . busPIRone  7.5 mg Oral BID  . chlorhexidine gluconate (MEDLINE KIT)  15 mL Mouth Rinse BID  . Chlorhexidine Gluconate Cloth  6 each Topical Daily  . escitalopram  10 mg Oral Daily  . furosemide  40 mg Intravenous Daily  . metoprolol  tartrate  12.5 mg Oral BID  . mupirocin ointment  1 application Nasal BID  . pneumococcal 23 valent vaccine  0.5 mL Intramuscular Tomorrow-1000  . potassium chloride  20 mEq Oral Daily  . QUEtiapine  25 mg Oral QHS  . sodium chloride flush  10-40 mL Intracatheter Q12H  . vitamin B-12  1,000 mcg Oral Daily   Continuous Infusions: . sodium chloride 10 mL/hr at 04/15/19 2255  . cefTRIAXone (ROCEPHIN)  IV 2 g (04/26/19 1050)  . vancomycin 750 mg (04/26/19 0413)     LOS: 25 days    Time spent: 25 mins    Sybrina Laning, MD [facility]   If 7PM-7AM, please contact night-coverage

## 2019-04-26 NOTE — Progress Notes (Signed)
Pt had anxiety attack about being in the hospital hooked up to a bedside monitor, IV pump and bed alarm.  Pt started that "she wasn't going to have any of this any more" and started crying and cursing.  I asked her if I could get anything for her anxiety and she refused. I asked if I could place the bed alarm back on and she refused.  Floor mats and low bed in lowest position.  Will monitor for patient safety with frequent checks.

## 2019-04-26 NOTE — Progress Notes (Addendum)
Called Dr. Lucianne Muss, MD and explained that pt was agitated and unwilling to take medications and unwilling to comply with safety measures such as using the call light.  I made the call to Dr. Lucianne Muss, MD in the hallway informing him that she had barricaded herself in the bathroom which was unsafe since I could not monitor her safety especially since the patient is not willing to call for assistance.  Pt overheard my conversation and became violently enraged and started shouting loudly into the hallway screaming and cursing at me and stated "I am going to strike (hit) her".  Marylene Land, RN and Zoe, NT went into the patient's room to speak to the patient. I was instructed by Marylene Land, RN to wait until family (sister) arrives to attempt any care such as medication administration or obtaining vital signs.  I was instructed by Marylene Land, RN that I needed to deescalate the situation.

## 2019-04-27 ENCOUNTER — Inpatient Hospital Stay (HOSPITAL_COMMUNITY): Payer: Medicaid Other

## 2019-04-27 LAB — COMPREHENSIVE METABOLIC PANEL
ALT: 15 U/L (ref 0–44)
AST: 23 U/L (ref 15–41)
Albumin: 2.5 g/dL — ABNORMAL LOW (ref 3.5–5.0)
Alkaline Phosphatase: 80 U/L (ref 38–126)
Anion gap: 10 (ref 5–15)
BUN: 12 mg/dL (ref 6–20)
CO2: 24 mmol/L (ref 22–32)
Calcium: 8.6 mg/dL — ABNORMAL LOW (ref 8.9–10.3)
Chloride: 103 mmol/L (ref 98–111)
Creatinine, Ser: 0.61 mg/dL (ref 0.44–1.00)
GFR calc Af Amer: >60 mL/min (ref >60)
GFR calc non Af Amer: >60 mL/min (ref >60)
Glucose, Bld: 96 mg/dL (ref 70–99)
Potassium: 3.6 mmol/L (ref 3.5–5.1)
Sodium: 137 mmol/L (ref 135–145)
Total Bilirubin: 0.6 mg/dL (ref 0.3–1.2)
Total Protein: 6.2 g/dL — ABNORMAL LOW (ref 6.5–8.1)

## 2019-04-27 LAB — CBC
HCT: 36.8 % (ref 36.0–46.0)
Hemoglobin: 11.9 g/dL — ABNORMAL LOW (ref 12.0–15.0)
MCH: 27.9 pg (ref 26.0–34.0)
MCHC: 32.3 g/dL (ref 30.0–36.0)
MCV: 86.4 fL (ref 80.0–100.0)
Platelets: 305 10*3/uL (ref 150–400)
RBC: 4.26 MIL/uL (ref 3.87–5.11)
RDW: 18.6 % — ABNORMAL HIGH (ref 11.5–15.5)
WBC: 4.5 10*3/uL (ref 4.0–10.5)
nRBC: 0 % (ref 0.0–0.2)

## 2019-04-27 LAB — PROTIME-INR
INR: 1.3 — ABNORMAL HIGH (ref 0.8–1.2)
Prothrombin Time: 15.6 seconds — ABNORMAL HIGH (ref 11.4–15.2)

## 2019-04-27 MED ORDER — SODIUM CHLORIDE 0.9 % IV SOLN
750.0000 mg | INTRAVENOUS | Status: AC
  Administered 2019-04-28: 750 mg via INTRAVENOUS
  Filled 2019-04-27: qty 750

## 2019-04-27 MED ORDER — SODIUM CHLORIDE 0.9 % IV SOLN
1.5000 g | INTRAVENOUS | Status: AC
  Administered 2019-04-28: 1.5 g via INTRAVENOUS
  Filled 2019-04-27: qty 1.5

## 2019-04-27 MED ORDER — MILRINONE LACTATE IN DEXTROSE 20-5 MG/100ML-% IV SOLN
0.3000 ug/kg/min | INTRAVENOUS | Status: DC
  Filled 2019-04-27: qty 100

## 2019-04-27 MED ORDER — NOREPINEPHRINE 4 MG/250ML-% IV SOLN
0.0000 ug/min | INTRAVENOUS | Status: AC
  Administered 2019-04-28: 09:00:00 2 ug/min via INTRAVENOUS
  Filled 2019-04-27: qty 250

## 2019-04-27 MED ORDER — NITROGLYCERIN IN D5W 200-5 MCG/ML-% IV SOLN
2.0000 ug/min | INTRAVENOUS | Status: DC
  Filled 2019-04-27: qty 250

## 2019-04-27 MED ORDER — TRANEXAMIC ACID (OHS) PUMP PRIME SOLUTION
2.0000 mg/kg | INTRAVENOUS | Status: DC
  Filled 2019-04-27: qty 1.45

## 2019-04-27 MED ORDER — VANCOMYCIN HCL 1250 MG/250ML IV SOLN
1250.0000 mg | INTRAVENOUS | Status: DC
  Filled 2019-04-27: qty 250

## 2019-04-27 MED ORDER — PLASMA-LYTE 148 IV SOLN
INTRAVENOUS | Status: DC
  Filled 2019-04-27: qty 2.5

## 2019-04-27 MED ORDER — CHLORHEXIDINE GLUCONATE CLOTH 2 % EX PADS
6.0000 | MEDICATED_PAD | Freq: Once | CUTANEOUS | Status: AC
  Administered 2019-04-27: 6 via TOPICAL

## 2019-04-27 MED ORDER — TRANEXAMIC ACID 1000 MG/10ML IV SOLN
1.5000 mg/kg/h | INTRAVENOUS | Status: AC
  Administered 2019-04-28: 1.5 mg/kg/h via INTRAVENOUS
  Filled 2019-04-27: qty 25

## 2019-04-27 MED ORDER — DEXMEDETOMIDINE HCL IN NACL 400 MCG/100ML IV SOLN
0.1000 ug/kg/h | INTRAVENOUS | Status: AC
  Administered 2019-04-28: .2 ug/kg/h via INTRAVENOUS
  Filled 2019-04-27: qty 100

## 2019-04-27 MED ORDER — TRANEXAMIC ACID (OHS) BOLUS VIA INFUSION
15.0000 mg/kg | INTRAVENOUS | Status: AC
  Administered 2019-04-28: 1084.5 mg via INTRAVENOUS
  Filled 2019-04-27: qty 1085

## 2019-04-27 MED ORDER — MAGNESIUM SULFATE 50 % IJ SOLN
40.0000 meq | INTRAMUSCULAR | Status: DC
  Filled 2019-04-27: qty 9.85

## 2019-04-27 MED ORDER — INSULIN REGULAR(HUMAN) IN NACL 100-0.9 UT/100ML-% IV SOLN
INTRAVENOUS | Status: AC
  Administered 2019-04-28: .5 [IU]/h via INTRAVENOUS
  Filled 2019-04-27: qty 100

## 2019-04-27 MED ORDER — EPINEPHRINE HCL 5 MG/250ML IV SOLN IN NS
0.0000 ug/min | INTRAVENOUS | Status: DC
  Filled 2019-04-27: qty 250

## 2019-04-27 MED ORDER — PHENYLEPHRINE HCL-NACL 20-0.9 MG/250ML-% IV SOLN
30.0000 ug/min | INTRAVENOUS | Status: AC
  Administered 2019-04-28: 20 ug/min via INTRAVENOUS
  Filled 2019-04-27: qty 250

## 2019-04-27 MED ORDER — CHLORHEXIDINE GLUCONATE CLOTH 2 % EX PADS
6.0000 | MEDICATED_PAD | Freq: Once | CUTANEOUS | Status: AC
  Administered 2019-04-28: 6 via TOPICAL

## 2019-04-27 MED ORDER — POTASSIUM CHLORIDE 2 MEQ/ML IV SOLN
80.0000 meq | INTRAVENOUS | Status: DC
  Filled 2019-04-27: qty 40

## 2019-04-27 MED ORDER — SODIUM CHLORIDE 0.9 % IV SOLN
INTRAVENOUS | Status: DC
  Filled 2019-04-27: qty 30

## 2019-04-27 NOTE — Progress Notes (Signed)
      [address]       Lauren Reyes [id number]             [phone]    I have reviewed with the patient her mother and sister the current status of her endocarditis.  The signs and symptoms of stroke have improved, the patient is conversant and asks appropriate questions.  Follow-up echocardiogram shows resolution of vegetations especially on the mitral valve, and aortic valve, but still has severe-and what appears to be worsening aortic insufficiency.  There is also moderate mitral insufficiency, moderate to severe tricuspid insufficiency.  There appears to be vegetations or clot on the right atrial wall.  I discussed with the patient and her family the options of mechanical versus tissue valves.  The use of mechanical valve especially for the aortic valve and lifelong commitment to Coumadin is likely not to be the best choice, especially of childbearing age.  Depending on the operative findings homograft for tissue valve will be considered.  I discussed with them the risk of complete heart block and need for permanent pacemaker in addition.  The patient is willing to proceed with aortic valve, mitral valve, tricuspid valve repair or replacement, possible placement of permanent pacer leads.   for perminate pacer  have all been discussed specifically.  I have quoted Lauren Reyes a 10 % of perioperative mortality and a complication ra The goals risks and alternatives of the planned surgical procedure  have been discussed with the patient in detail. The risks of the procedure including death, infection, stroke, myocardial infarction, bleeding, blood transfusion, complete heart block and need for permanent pacer   as high as 50 %. The patient's questions have been answered.Lauren Reyes is willing  to proceed with the planned procedure.  Delight Ovens MD      [address] [place] [place] [address] Office [phone]

## 2019-04-27 NOTE — Progress Notes (Signed)
Occupational Therapy Treatment Patient Details Name: Lauren Reyes MRN: [id number] DOB: [birth year] Today's Date: 04/27/2019    History of present illness Pt is 29 year old female who was admitted with suspected IV drug abuse was being interviewed by police and developed right-sided facial weakness, aphasia, and R weakness.  She was brought by EMS to the emergency room work-up revealed occluded left MCA. Revascularization performed by IR. CT head and neck demonstrate multiple small cavitary abscesses in the upper lobes bilaterally. IVC filter placement for RLE DVT on 3/26. Pt on antibiotics for multivalvular endocarditis.  Cardiothoracic surgery have tentative plans to go forward with AVR and possible TV repair/replacement early next week.   OT comments  Pt making progress in therapy, however performance limited this date due to fatigue. Pt reports that she's had a busy morning with various procedures and limited sleep. Pt engaged in IADL bed making task only able to tolerate being on feet 1 min at a time due to fatigue. Pt required min assist to make the bed and min cues for safety throughout. Educated pt on energy conservation techniques for task with good understanding and follow through. Pt required increased time and frequent mod rest breaks throughout to complete. OT will continue to follow acutely.    Follow Up Recommendations  Home health OT;Supervision - Intermittent    Equipment Recommendations  3 in 1 bedside commode    Recommendations for Other Services      Precautions / Restrictions Precautions Precautions: Fall Precaution Comments: impulsive Restrictions Weight Bearing Restrictions: No       Mobility Bed Mobility Overal bed mobility: Modified Independent Bed Mobility: Supine to Sit;Sit to Supine     Supine to sit: Modified independent (Device/Increase time) Sit to supine: Modified independent (Device/Increase time)   General bed mobility comments: Mod I for  increased time and use of bedrail.  Transfers Overall transfer level: Needs assistance Equipment used: None Transfers: Sit to/from Stand Sit to Stand: Supervision         General transfer comment: To ensure balance and safety. Pt more fatigued this date requirnig increased cues for safety.     Balance Overall balance assessment: Needs assistance Sitting-balance support: Feet supported Sitting balance-Leahy Scale: Good       Standing balance-Leahy Scale: Fair                             ADL either performed or assessed with clinical judgement   ADL Overall ADL's : Needs assistance/impaired                                     Functional mobility during ADLs: Supervision/safety General ADL Comments: Pt minimally able to ambulate around due to fatigue requiring cues for safety. Pt engaged in IADL bed making task this date with min assist.      Vision       Perception     Praxis      Cognition Arousal/Alertness: Awake/alert Behavior During Therapy: WFL for tasks assessed/performed Overall Cognitive Status: Impaired/Different from baseline Area of Impairment: Safety/judgement                         Safety/Judgement: Decreased awareness of safety;Decreased awareness of deficits     General Comments: Pt willing to participate in therapy. Pt continues to require cues for safety and increased time  to complete all tasks. Pt more lethargic this date.         Exercises     Shoulder Instructions       General Comments      Pertinent Vitals/ Pain       Pain Assessment: No/denies pain  Home Living                                          Prior Functioning/Environment              Frequency           Progress Toward Goals  OT Goals(current goals can now be found in the care plan section)  Progress towards OT goals: Progressing toward goals  ADL Goals Pt Will Perform Grooming:  Independently;standing Pt Will Perform Tub/Shower Transfer: with supervision;Tub transfer;grab bars Additional ADL Goal #1: Pt to complete all ADLs including item retrieval independently. Additional ADL Goal #2: Pt to tolerate standing up to 10 min independently, in preparation for ADLs. Additional ADL Goal #3: Pt to recall and verbalize 3 fall prevention strategies with 0 verbal cues.  Plan Discharge plan remains appropriate    Co-evaluation                 AM-PAC OT "6 Clicks" Daily Activity     Outcome Measure   Help from another person eating meals?: None Help from another person taking care of personal grooming?: A Little Help from another person toileting, which includes using toliet, bedpan, or urinal?: A Little Help from another person bathing (including washing, rinsing, drying)?: A Little Help from another person to put on and taking off regular upper body clothing?: A Little Help from another person to put on and taking off regular lower body clothing?: A Little 6 Click Score: 19    End of Session    OT Visit Diagnosis: Unsteadiness on feet (R26.81);Muscle weakness (generalized) (M62.81);Cognitive communication deficit (R41.841) Symptoms and signs involving cognitive functions: Cerebral infarction   Activity Tolerance Patient limited by fatigue;Patient limited by lethargy   Patient Left in bed;with call bell/phone within reach   Nurse Communication Mobility status        Time: 8127-5170 OT Time Calculation (min): 23 min  Charges: OT General Charges $OT Visit: 1 Visit OT Treatments $Self Care/Home Management : 8-22 mins $Therapeutic Activity: 8-22 mins  Mauri Brooklyn OTR/L [phone]   Mauri Brooklyn 04/27/2019, 1:11 PM

## 2019-04-27 NOTE — Progress Notes (Signed)
      [address]       [place] [address]             [phone]      20 Days Post-Op Procedure(s) (LRB): TRANSESOPHAGEAL ECHOCARDIOGRAM (TEE) (N/A) BUBBLE STUDY Subjective: Feels okay this morning. Unhappy with nursing over the weekend. She states she is ready for surgery tomorrow.   Objective: Vital signs in last 24 hours: Temp:  [97.8 F (36.6 C)-98.4 F (36.9 C)] 98.4 F (36.9 C) (04/19 0312) Pulse Rate:  [101-113] 108 (04/19 0312) Resp:  [18-20] 20 (04/19 0312) BP: (102-124)/(37-72) 108/37 (04/19 0312) SpO2:  [96 %-100 %] 96 % (04/19 0312)     Intake/Output from previous day: 04/18 0701 - 04/19 0700 In: 1715.4 [IV Piggyback:1715.4] Out: -  Intake/Output this shift: No intake/output data recorded.  General appearance: alert, cooperative and no distress Heart: sinus tachycardia Lungs: clear to auscultation bilaterally Abdomen: soft, non-tender; bowel sounds normal; no masses,  no organomegaly Extremities: extremities normal, atraumatic, no cyanosis or edema Wound: n/a  Lab Results: Recent Labs    04/26/19 0530  WBC 4.0  HGB 10.8*  HCT 34.5*  PLT 276   BMET:  Recent Labs    04/26/19 0530  NA 137  K 3.7  CL 104  CO2 24  GLUCOSE 87  BUN 12  CREATININE 0.60  CALCIUM 8.7*    PT/INR: No results for input(s): LABPROT, INR in the last 72 hours. ABG    Component Value Date/Time   PHART 7.347 (L) 04/02/2019 0032   HCO3 30.5 (H) 04/02/2019 0032   TCO2 32 04/02/2019 0032   O2SAT 100.0 04/02/2019 0032   CBG (last 3)  No results for input(s): GLUCAP in the last 72 hours.  Assessment/Plan: S/P Procedure(s) (LRB): TRANSESOPHAGEAL ECHOCARDIOGRAM (TEE) (N/A) BUBBLE STUDY  1. CV- sinus tachycardia at times. BP stable. Endocarditis continue IV abx. Echo reviewed.  2. Pulm-no issues. Tolerating room air 3. Renal-creatinine 0.60, electrolytes okay.  4. Endo-blood glucose well controlled 5. H and H 10.8/34.5, stable 6. CM following  and assisting with rehab and a plan after hospital stay  Plan: OR tomorrow. All questions answered. Meeting with Dr. Tyrone Sage and family today at 2pm.    LOS: 26 days    [name] 04/27/2019

## 2019-04-27 NOTE — Progress Notes (Signed)
PROGRESS NOTE    Lauren Reyes  MRN:[id number] DOB: 07/20/1990 DOA: 04/01/2019 PCP: Patient, No Pcp Per   Brief Narrative: 29-year-old female unknown past medical history, possible heroin abuse presented to ER with a code stroke, aphasia, right-sided weakness.  Patient was admitted to neurology ICU intubated after thrombectomy for left MCA M3 occlusion with recanalization complicated by dissection/pseudoaneurysm of left ICA status post stent, extubated March 27.  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 patient transferred to [facility].  Subjective: Aaox3, resting on bed, no complaints- no CP/SOB or fever. reorts she is meetinh with surgeon this afternoon at 2 along with her parents. Reports she feels ready for surgery tomorrow. Patient was unhappy over the weekend with the nursing staffs.  Assessment & Plan:  Culture negative endocarditis involving multiple heart valves/Severe AR:Tracheal aspirate gerw MSSA.Imaging showed septic emboli in left hemisphere of brain, in lung, spleen with possible splenic and kidney infarcts. Initially the patient was treated with Vancomycin and Unasyn.  Currently vancomycin/Rocephin as per infectious disease-last dose planned for 05/17/2019 to complete 6 weeks of therapy.  Afebrile, leukocytosis resolved.  Seen by CT surgery Gerhardt: patient eventually will require aortic valve replacement, however this is complicated by recent stroke and recent stent on aspirin and Brilinta, as well as a recent decline in mental status.Per Dr. Gearhart, surgery should be reconsidered should the patient developed worsening of heart failure syndrome and she has to be off Brilinta for 7 days.However, the patient was re-evaluated by CTS 4/18- and stopped her Brillinta and plan for AVR and possible TV repair or replacement early this week.  Cardiology has signed off.  Continue to monitor closely.   Dyspnea/tachypnea:  Resolved.CT chest 4/18-mild progression of cavitary bronchopneumonia with nodules and septic pulmonary emboli .no PE. On lasix, IS.  Acute small CVA: she had a small infarct of left MCA territory. TPA was deferred as concern for septic emboli.she is s/p leftcommon carotid arteriogram followed by complete revascularization of occluded M3 region of inferior division ofleft MCA complicated by dissection/pseudoaneurysm of left ICA-s/p placement of a 4.5 mm x 25mm pipeline flow diverter at site of focal dissection associated with a small pseudoaneurysm of distal cervical LT ICA. She has been continued on ASA and Brillinta. A1c is 5.9, VLDL was 52. She is on lipitor 80 mg daily.  Brilinta held preoperatively 4/18.  Hypokalemia resolved.  Dyslipidemia:LDL at 42, cont Lipitor.  Sinus tachycardia/tachypnea: Multifactorial drug withdrawals deconditioning, acute illness. TSH 0.8. Has been started on as needed ativan. And on low-dose Lopressor.Cardiology has advised caution with any increase in dose of beta blocker due to the patient's severe AR.  Right common femoral DVT not a anticoagulation candidate due to large right groin hematoma postprocedure and endocarditis so she has undergone IVC filter placement on March 26 by IR  Large right groin hematoma postprocedure pseudoaneurysm addressed with status post 8F Angio-Seal for hemostasis.  Monitor CBC intermittently hemoglobin is stable  Acute blood loss anemia with right groin hematoma status post 2 unit PRBC on March 27.  Seen by GI March 27 do not suspect GI bleed.  Monitor hemoglobin intermittently.  B12 deficiency continue supplement Hyponatremia resolved. Hepatitis C antibody positive negative viral load HIV screen negative RPR negative.  Need outpatient follow-up.  Polysubstance abuse UDS positive for THC, amphetamine on admission.  Suspected IV drug use with heroin and multiple needle marks bilateral upper extremities, patient endorses IV heroin  use ongoing for many years and wanting   to quit.  Patient to complete IV antibiotic supervised setting in the hospital. She will need to have a plan in place for substance abuse rehab as outpatient before she goes to surgery.  Anxiety versus drug withdrawal positive for benzodiazepines on admission.  Had episode of crying acting erratic and a pill for, appears to be responding with Ativan.  Monitor.  She has been started on a scheduled BuSpar.  Debility/deconditioning continue PT OT, encourage mobility.  DVT prophylaxis:SCD Code Status:FULL Family Communication: plan of care discussed with patient at bedside. Status is: Inpatient  Remains inpatient appropriate because:Inpatient level of care appropriate due to severity of illness, for continued IV antibiotics and for surgical intervention.  Dispo: The patient is from: Home              Anticipated d/c is to: Home              Anticipated d/c date is: > 3 days              Patient currently is not medically stable to d/c. Nutrition: Diet Order            Diet regular Room service appropriate? Yes; Fluid consistency: Thin  Diet effective now              Nutrition Problem: Increased nutrient needs Etiology: acute illness Signs/Symptoms: estimated needs Interventions: Ensure Enlive (each supplement provides 350kcal and 20 grams of protein) Body mass index is 26.52 kg/m.  Consultants: Infectious disease, neurology, cardiology, cardiothoracic surgery.   Procedures:see note Microbiology:see note  Medications: Scheduled Meds: . aspirin  81 mg Oral Daily  . busPIRone  7.5 mg Oral BID  . chlorhexidine gluconate (MEDLINE KIT)  15 mL Mouth Rinse BID  . Chlorhexidine Gluconate Cloth  6 each Topical Daily  . escitalopram  10 mg Oral Daily  . furosemide  40 mg Intravenous Daily  . metoprolol tartrate  12.5 mg Oral BID  . mupirocin ointment  1 application Nasal BID  . pneumococcal 23 valent vaccine  0.5 mL Intramuscular Tomorrow-1000   . potassium chloride  20 mEq Oral Daily  . QUEtiapine  25 mg Oral QHS  . sodium chloride flush  10-40 mL Intracatheter Q12H  . vitamin B-12  1,000 mcg Oral Daily   Continuous Infusions: . sodium chloride 10 mL/hr at 04/15/19 2255  . cefTRIAXone (ROCEPHIN)  IV 2 g (04/26/19 2314)  . vancomycin 750 mg (04/27/19 0404)    Antimicrobials: Anti-infectives (From admission, onward)   Start     Dose/Rate Route Frequency Ordered Stop   04/15/19 0900  vancomycin (VANCOREADY) IVPB 750 mg/150 mL     750 mg 150 mL/hr over 60 Minutes Intravenous Every 8 hours 04/15/19 0821     04/13/19 1400  vancomycin (VANCOCIN) IVPB 1000 mg/200 mL premix  Status:  Discontinued     1,000 mg 200 mL/hr over 60 Minutes Intravenous Every 8 hours 04/13/19 0839 04/15/19 0821   04/12/19 2200  vancomycin (VANCOREADY) IVPB 750 mg/150 mL  Status:  Discontinued     750 mg 150 mL/hr over 60 Minutes Intravenous Every 8 hours 04/12/19 1500 04/13/19 0839   04/10/19 1336  vancomycin (VANCOCIN) IVPB 1000 mg/200 mL premix  Status:  Discontinued     1,000 mg 200 mL/hr over 60 Minutes Intravenous Every 8 hours 04/10/19 1320 04/12/19 1500   04/08/19 1200  cefTRIAXone (ROCEPHIN) 2 g in sodium chloride 0.9 % 100 mL IVPB     2 g 200 mL/hr over 30   Minutes Intravenous Every 12 hours 04/08/19 1013 05/17/19 2359   04/08/19 0530  vancomycin (VANCOCIN) IVPB 1000 mg/200 mL premix  Status:  Discontinued     1,000 mg 200 mL/hr over 60 Minutes Intravenous Every 12 hours 04/07/19 1541 04/10/19 1320   04/07/19 1900  Ampicillin-Sulbactam (UNASYN) 3 g in sodium chloride 0.9 % 100 mL IVPB  Status:  Discontinued     3 g 200 mL/hr over 30 Minutes Intravenous Every 6 hours 04/07/19 1457 04/08/19 1013   04/07/19 1730  vancomycin (VANCOREADY) IVPB 1500 mg/300 mL     1,500 mg 150 mL/hr over 120 Minutes Intravenous  Once 04/07/19 1541 04/07/19 1937   04/04/19 1400  ceFAZolin (ANCEF) IVPB 2g/100 mL premix  Status:  Discontinued     2 g 200 mL/hr over  30 Minutes Intravenous Every 8 hours 04/04/19 1119 04/07/19 1457   04/04/19 0400  Vancomycin (VANCOCIN) 1,250 mg in sodium chloride 0.9 % 250 mL IVPB  Status:  Discontinued     1,250 mg 166.7 mL/hr over 90 Minutes Intravenous Every 12 hours 04/03/19 1635 04/04/19 1159   04/02/19 1800  ceFEPIme (MAXIPIME) 2 g in sodium chloride 0.9 % 100 mL IVPB  Status:  Discontinued     2 g 200 mL/hr over 30 Minutes Intravenous Every 8 hours 04/02/19 1218 04/04/19 1127   04/02/19 1600  vancomycin (VANCOCIN) IVPB 1000 mg/200 mL premix  Status:  Discontinued     1,000 mg 200 mL/hr over 60 Minutes Intravenous Every 12 hours 04/02/19 0100 04/03/19 1635   04/02/19 0200  piperacillin-tazobactam (ZOSYN) IVPB 3.375 g  Status:  Discontinued     3.375 g 12.5 mL/hr over 240 Minutes Intravenous Every 8 hours 04/02/19 0059 04/02/19 1219   04/02/19 0200  vancomycin (VANCOCIN) 1,500 mg in sodium chloride 0.9 % 500 mL IVPB     1,500 mg 250 mL/hr over 120 Minutes Intravenous  Once 04/02/19 0100 04/02/19 0923   04/01/19 2040  ceFAZolin (ANCEF) 2-4 GM/100ML-% IVPB    Note to Pharmacy: Brisson, Mark   : cabinet override      04/01/19 2040 04/02/19 0844       Objective: Vitals: Today's Vitals   04/26/19 2028 04/26/19 2248 04/27/19 0312 04/27/19 0753  BP: (!) 116/42 (!) 102/45 (!) 108/37 (!) 96/41  Pulse: (!) 113 (!) 103 (!) 108 (!) 113  Resp: 20 20 20 17  Temp: 98 F (36.7 C) 98 F (36.7 C) 98.4 F (36.9 C) 98.1 F (36.7 C)  TempSrc: Oral Oral Oral Oral  SpO2: 99% 98% 96% 96%  Weight:      Height:      PainSc:        Intake/Output Summary (Last 24 hours) at 04/27/2019 0915 Last data filed at 04/26/2019 1517 Gross per 24 hour  Intake 1715.35 ml  Output -  Net 1715.35 ml   Filed Weights   04/01/19 1900 04/01/19 2023  Weight: 72.3 kg 72.3 kg   Weight change:    Intake/Output from previous day: 04/18 0701 - 04/19 0700 In: 1715.4 [IV Piggyback:1715.4] Out: -  Intake/Output this shift: No  intake/output data recorded.  Examination:  General exam: AAOx3,NAD,on  RA HEENT:Oral mucosa moist, Ear/Nose WNL grossly,dentition normal. Respiratory system: bilaterally CLEAR,no wheezing or crackles,no use of accessory muscle, non tender. Cardiovascular system: S1 & S2 +, regular, No JVD. Gastrointestinal system: Abdomen soft, NT,ND, BS+. Nervous System:Alert, awake, moving extremities and grossly nonfocal Extremities: No edema, distal peripheral pulses palpable.  Skin: No rashes,no icterus. MSK:   Normal muscle bulk,tone, power  Data Reviewed: I have personally reviewed following labs and imaging studies CBC: Recent Labs  Lab 04/22/19 1044 04/24/19 0449 04/26/19 0530  WBC 3.4* 4.2 4.0  HGB 11.0* 10.8* 10.8*  HCT 35.0* 34.3* 34.5*  MCV 87.3 87.1 86.5  PLT 347 292 008   Basic Metabolic Panel: Recent Labs  Lab 04/22/19 1044 04/24/19 0449 04/26/19 0530 04/27/19 0713  NA 138 136 137 137  K 3.7 3.9 3.7 3.6  CL 106 102 104 103  CO2 _0 GLUCOSE 121* 89 87 96  BUN _1 CREATININE 0.62 0.62 0.60 0.61  CALCIUM 8.5* 8.8* 8.7* 8.6*  MG  --  1.8 1.7  --   PHOS  --  3.5 3.8  --    GFR: Estimated Creatinine Clearance: 102.4 mL/min (by C-G formula based on SCr of 0.61 mg/dL). Liver Function Tests: Recent Labs  Lab 04/27/19 0713  AST 23  ALT 15  ALKPHOS 80  BILITOT 0.6  PROT 6.2*  ALBUMIN 2.5*   No results for input(s): LIPASE, AMYLASE in the last 168 hours. No results for input(s): AMMONIA in the last 168 hours. Coagulation Profile: Recent Labs  Lab 04/27/19 0730  INR 1.3*   Cardiac Enzymes: No results for input(s): CKTOTAL, CKMB, CKMBINDEX, TROPONINI in the last 168 hours. BNP (last 3 results) No results for input(s): PROBNP in the last 8760 hours. HbA1C: No results for input(s): HGBA1C in the last 72 hours. CBG: No results for input(s): GLUCAP in the last 168 hours. Lipid Profile: No results for input(s): CHOL, HDL, LDLCALC, TRIG, CHOLHDL,  LDLDIRECT in the last 72 hours. Thyroid Function Tests: No results for input(s): TSH, T4TOTAL, FREET4, T3FREE, THYROIDAB in the last 72 hours. Anemia Panel: No results for input(s): VITAMINB12, FOLATE, FERRITIN, TIBC, IRON, RETICCTPCT in the last 72 hours. Sepsis Labs: No results for input(s): PROCALCITON, LATICACIDVEN in the last 168 hours.  Recent Results (from the past 240 hour(s))  Surgical PCR screen     Status: None   Collection Time: 04/24/19  6:56 PM   Specimen: Nasal Mucosa; Nasal Swab  Result Value Ref Range Status   MRSA, PCR NEGATIVE NEGATIVE Final   Staphylococcus aureus NEGATIVE NEGATIVE Final    Comment: (NOTE) The Xpert SA Assay (FDA approved for NASAL specimens in patients 73 years of age and older), is one component of a comprehensive surveillance program. It is not intended to diagnose infection nor to guide or monitor treatment. Performed at [facility], [place] [address], [place], [place] [address]       Radiology Studies: No results found.   LOS: 26 days   Time spent: More than 50% of that time was spent in counseling and/or coordination of care.  Antonieta Pert, MD [facility]  04/27/2019, 9:15 AM

## 2019-04-27 NOTE — TOC Progression Note (Signed)
Transition of Care [facility]) - Progression Note    Patient Details  Name: Lauren Reyes MRN: [id number] Date of Birth: 1990/12/05  [facility] CM/SW Contact  Pollie Friar, RN Phone Number: 04/27/2019, 12:02 PM  Clinical Narrative:    CM met with the patient and she states she has spoken to [facility] about counseling post hospitalization but can not arrange anything until closer to d/c. Pt is stating she will make these arrangements prior to d/c from the hospital.  TOC following.   Expected Discharge Plan: Home/Self Care    Expected Discharge Plan and Services Expected Discharge Plan: Home/Self Care In-house Referral: Clinical Social Work Discharge Planning Services: CM Consult                                           Social Determinants of Health (SDOH) Interventions    Readmission Risk Interventions No flowsheet data found.

## 2019-04-28 ENCOUNTER — Other Ambulatory Visit: Payer: Self-pay

## 2019-04-28 ENCOUNTER — Inpatient Hospital Stay (HOSPITAL_COMMUNITY): Payer: Medicaid Other

## 2019-04-28 ENCOUNTER — Encounter (HOSPITAL_COMMUNITY)
Admission: EM | Disposition: A | Discharge status: Home / Self Care | Payer: Self-pay | Source: Home / Self Care | Attending: Cardiothoracic Surgery

## 2019-04-28 ENCOUNTER — Inpatient Hospital Stay (HOSPITAL_COMMUNITY): Payer: Medicaid Other | Admitting: Anesthesiology

## 2019-04-28 DIAGNOSIS — I350 Nonrheumatic aortic (valve) stenosis: Secondary | ICD-10-CM

## 2019-04-28 DIAGNOSIS — I360 Nonrheumatic tricuspid (valve) stenosis: Secondary | ICD-10-CM

## 2019-04-28 DIAGNOSIS — I38 Endocarditis, valve unspecified: Secondary | ICD-10-CM | POA: Diagnosis present

## 2019-04-28 DIAGNOSIS — I339 Acute and subacute endocarditis, unspecified: Secondary | ICD-10-CM

## 2019-04-28 HISTORY — PX: AORTIC VALVE REPLACEMENT: SHX41

## 2019-04-28 HISTORY — PX: TRICUSPID VALVE REPLACEMENT: SHX816

## 2019-04-28 HISTORY — DX: Endocarditis, valve unspecified: I38

## 2019-04-28 HISTORY — PX: TEE WITHOUT CARDIOVERSION: SHX5443

## 2019-04-28 LAB — POCT I-STAT 7, (LYTES, BLD GAS, ICA,H+H)
Acid-base deficit: 6 mmol/L — ABNORMAL HIGH (ref 0.0–2.0)
Acid-base deficit: 2 mmol/L (ref 0.0–2.0)
Acid-base deficit: 5 mmol/L — ABNORMAL HIGH (ref 0.0–2.0)
Acid-base deficit: 1 mmol/L (ref 0.0–2.0)
Bicarbonate: 21.5 mmol/L (ref 20.0–28.0)
Bicarbonate: 23.4 mmol/L (ref 20.0–28.0)
Bicarbonate: 20.7 mmol/L (ref 20.0–28.0)
Bicarbonate: 24.5 mmol/L (ref 20.0–28.0)
Calcium, Ion: 1.32 mmol/L (ref 1.15–1.40)
Calcium, Ion: 1.22 mmol/L (ref 1.15–1.40)
Calcium, Ion: 1.21 mmol/L (ref 1.15–1.40)
Calcium, Ion: 1.25 mmol/L (ref 1.15–1.40)
HCT: 35 % — ABNORMAL LOW (ref 36.0–46.0)
HCT: 29 % — ABNORMAL LOW (ref 36.0–46.0)
HCT: 30 % — ABNORMAL LOW (ref 36.0–46.0)
HCT: 30 % — ABNORMAL LOW (ref 36.0–46.0)
Hemoglobin: 11.9 g/dL — ABNORMAL LOW (ref 12.0–15.0)
Hemoglobin: 9.9 g/dL — ABNORMAL LOW (ref 12.0–15.0)
Hemoglobin: 10.2 g/dL — ABNORMAL LOW (ref 12.0–15.0)
Hemoglobin: 10.2 g/dL — ABNORMAL LOW (ref 12.0–15.0)
O2 Saturation: 89 %
O2 Saturation: 100 %
O2 Saturation: 96 %
O2 Saturation: 97 %
Patient temperature: 36.9
Patient temperature: 36.7
Patient temperature: 36.6
Patient temperature: 36.6
Potassium: 4 mmol/L (ref 3.5–5.1)
Potassium: 3.6 mmol/L (ref 3.5–5.1)
Potassium: 3.9 mmol/L (ref 3.5–5.1)
Potassium: 4.3 mmol/L (ref 3.5–5.1)
Sodium: 141 mmol/L (ref 135–145)
Sodium: 142 mmol/L (ref 135–145)
Sodium: 141 mmol/L (ref 135–145)
Sodium: 140 mmol/L (ref 135–145)
TCO2: 23 mmol/L (ref 22–32)
TCO2: 25 mmol/L (ref 22–32)
TCO2: 22 mmol/L (ref 22–32)
TCO2: 26 mmol/L (ref 22–32)
pCO2 arterial: 50.7 mmHg — ABNORMAL HIGH (ref 32.0–48.0)
pCO2 arterial: 39.3 mmHg (ref 32.0–48.0)
pCO2 arterial: 41.1 mmHg (ref 32.0–48.0)
pCO2 arterial: 42.6 mmHg (ref 32.0–48.0)
pH, Arterial: 7.234 — ABNORMAL LOW (ref 7.350–7.450)
pH, Arterial: 7.381 (ref 7.350–7.450)
pH, Arterial: 7.309 — ABNORMAL LOW (ref 7.350–7.450)
pH, Arterial: 7.365 (ref 7.350–7.450)
pO2, Arterial: 68 mmHg — ABNORMAL LOW (ref 83.0–108.0)
pO2, Arterial: 169 mmHg — ABNORMAL HIGH (ref 83.0–108.0)
pO2, Arterial: 86 mmHg (ref 83.0–108.0)
pO2, Arterial: 91 mmHg (ref 83.0–108.0)

## 2019-04-28 LAB — HEMOGLOBIN AND HEMATOCRIT, BLOOD
HCT: 23.4 % — ABNORMAL LOW (ref 36.0–46.0)
Hemoglobin: 7.5 g/dL — ABNORMAL LOW (ref 12.0–15.0)

## 2019-04-28 LAB — CBC
HCT: 34.2 % — ABNORMAL LOW (ref 36.0–46.0)
HCT: 31.7 % — ABNORMAL LOW (ref 36.0–46.0)
Hemoglobin: 10.5 g/dL — ABNORMAL LOW (ref 12.0–15.0)
Hemoglobin: 9.9 g/dL — ABNORMAL LOW (ref 12.0–15.0)
MCH: 27.1 pg (ref 26.0–34.0)
MCH: 27.7 pg (ref 26.0–34.0)
MCHC: 30.7 g/dL (ref 30.0–36.0)
MCHC: 31.2 g/dL (ref 30.0–36.0)
MCV: 88.1 fL (ref 80.0–100.0)
MCV: 88.8 fL (ref 80.0–100.0)
Platelets: 216 10*3/uL (ref 150–400)
Platelets: 202 10*3/uL (ref 150–400)
RBC: 3.88 MIL/uL (ref 3.87–5.11)
RBC: 3.57 MIL/uL — ABNORMAL LOW (ref 3.87–5.11)
RDW: 18.3 % — ABNORMAL HIGH (ref 11.5–15.5)
RDW: 18.5 % — ABNORMAL HIGH (ref 11.5–15.5)
WBC: 16.7 10*3/uL — ABNORMAL HIGH (ref 4.0–10.5)
WBC: 14 10*3/uL — ABNORMAL HIGH (ref 4.0–10.5)
nRBC: 0 % (ref 0.0–0.2)
nRBC: 0 % (ref 0.0–0.2)

## 2019-04-28 LAB — GLUCOSE, CAPILLARY
Glucose-Capillary: 126 mg/dL — ABNORMAL HIGH (ref 70–99)
Glucose-Capillary: 105 mg/dL — ABNORMAL HIGH (ref 70–99)
Glucose-Capillary: 109 mg/dL — ABNORMAL HIGH (ref 70–99)
Glucose-Capillary: 110 mg/dL — ABNORMAL HIGH (ref 70–99)
Glucose-Capillary: 124 mg/dL — ABNORMAL HIGH (ref 70–99)

## 2019-04-28 LAB — PREPARE RBC (CROSSMATCH)

## 2019-04-28 LAB — BASIC METABOLIC PANEL
Anion gap: 10 (ref 5–15)
BUN: 12 mg/dL (ref 6–20)
CO2: 19 mmol/L — ABNORMAL LOW (ref 22–32)
Calcium: 7.8 mg/dL — ABNORMAL LOW (ref 8.9–10.3)
Chloride: 109 mmol/L (ref 98–111)
Creatinine, Ser: 0.58 mg/dL (ref 0.44–1.00)
GFR calc Af Amer: >60 mL/min (ref >60)
GFR calc non Af Amer: >60 mL/min (ref >60)
Glucose, Bld: 125 mg/dL — ABNORMAL HIGH (ref 70–99)
Potassium: 4.1 mmol/L (ref 3.5–5.1)
Sodium: 138 mmol/L (ref 135–145)

## 2019-04-28 LAB — APTT: aPTT: 29 seconds (ref 24–36)

## 2019-04-28 LAB — PROTIME-INR
INR: 1.4 — ABNORMAL HIGH (ref 0.8–1.2)
Prothrombin Time: 17.2 seconds — ABNORMAL HIGH (ref 11.4–15.2)

## 2019-04-28 LAB — MRSA PCR SCREENING: MRSA by PCR: NEGATIVE

## 2019-04-28 LAB — PLATELET COUNT: Platelets: 195 10*3/uL (ref 150–400)

## 2019-04-28 LAB — FIBRINOGEN: Fibrinogen: 317 mg/dL (ref 210–475)

## 2019-04-28 LAB — MAGNESIUM: Magnesium: 2.4 mg/dL (ref 1.7–2.4)

## 2019-04-28 SURGERY — REPLACEMENT, AORTIC VALVE, OPEN
Anesthesia: General | Site: Chest

## 2019-04-28 MED ORDER — MIDAZOLAM HCL 2 MG/2ML IJ SOLN: 2.0000 mg | INTRAMUSCULAR | Status: DC | PRN

## 2019-04-28 MED ORDER — LACTATED RINGERS IV SOLN: 500.0000 mL | Freq: Once | INTRAVENOUS | Status: DC | PRN

## 2019-04-28 MED ORDER — LIDOCAINE 2% (20 MG/ML) 5 ML SYRINGE
INTRAMUSCULAR | Status: DC | PRN
  Administered 2019-04-28: 60 mg via INTRAVENOUS

## 2019-04-28 MED ORDER — POTASSIUM CHLORIDE 10 MEQ/50ML IV SOLN: 10.0000 meq | INTRAVENOUS | Status: AC

## 2019-04-28 MED ORDER — FAMOTIDINE IN NACL 20-0.9 MG/50ML-% IV SOLN
20.0000 mg | Freq: Two times a day (BID) | INTRAVENOUS | Status: DC
  Administered 2019-04-28: 20 mg via INTRAVENOUS
  Filled 2019-04-28: qty 50

## 2019-04-28 MED ORDER — ACETAMINOPHEN 160 MG/5ML PO SOLN: 650.0000 mg | Freq: Once | ORAL | Status: AC

## 2019-04-28 MED ORDER — ASPIRIN 81 MG PO CHEW
324.0000 mg | CHEWABLE_TABLET | Freq: Every day | ORAL | Status: DC
  Administered 2019-04-30 – 2019-05-01 (×2): 324 mg
  Filled 2019-04-28 (×2): qty 4

## 2019-04-28 MED ORDER — INSULIN REGULAR(HUMAN) IN NACL 100-0.9 UT/100ML-% IV SOLN: INTRAVENOUS | Status: DC

## 2019-04-28 MED ORDER — MAGNESIUM SULFATE 4 GM/100ML IV SOLN
4.0000 g | Freq: Once | INTRAVENOUS | Status: AC
  Administered 2019-04-28: 4 g via INTRAVENOUS
  Filled 2019-04-28: qty 100

## 2019-04-28 MED ORDER — BUSPIRONE HCL 5 MG PO TABS
7.5000 mg | ORAL_TABLET | Freq: Two times a day (BID) | ORAL | Status: DC
  Administered 2019-04-29 – 2019-06-09 (×82): 7.5 mg via ORAL
  Filled 2019-04-28 (×6): qty 2
  Filled 2019-04-28: qty 1
  Filled 2019-04-28 (×40): qty 2
  Filled 2019-04-28 (×2): qty 1
  Filled 2019-04-28: qty 2
  Filled 2019-04-28: qty 1
  Filled 2019-04-28 (×6): qty 2
  Filled 2019-04-28: qty 1
  Filled 2019-04-28 (×9): qty 2
  Filled 2019-04-28: qty 1
  Filled 2019-04-28 (×16): qty 2

## 2019-04-28 MED ORDER — LACTATED RINGERS IV SOLN: INTRAVENOUS | Status: DC | PRN

## 2019-04-28 MED ORDER — VASOPRESSIN 20 UNIT/ML IV SOLN
INTRAVENOUS | Status: DC | PRN
  Administered 2019-04-28 (×3): 2 [IU] via INTRAVENOUS

## 2019-04-28 MED ORDER — ROCURONIUM BROMIDE 10 MG/ML (PF) SYRINGE
PREFILLED_SYRINGE | INTRAVENOUS | Status: AC
  Filled 2019-04-28: qty 10

## 2019-04-28 MED ORDER — METOPROLOL TARTRATE 12.5 MG HALF TABLET
12.5000 mg | ORAL_TABLET | Freq: Two times a day (BID) | ORAL | Status: DC
  Administered 2019-04-29 – 2019-05-13 (×26): 12.5 mg via ORAL
  Filled 2019-04-28 (×29): qty 1

## 2019-04-28 MED ORDER — VASOPRESSIN 20 UNIT/ML IV SOLN
INTRAVENOUS | Status: AC
  Filled 2019-04-28: qty 1

## 2019-04-28 MED ORDER — FENTANYL CITRATE (PF) 100 MCG/2ML IJ SOLN: 25.0000 ug | INTRAMUSCULAR | Status: DC | PRN

## 2019-04-28 MED ORDER — PROPOFOL 10 MG/ML IV BOLUS
INTRAVENOUS | Status: AC
  Filled 2019-04-28: qty 20

## 2019-04-28 MED ORDER — PLASMA-LYTE 148 IV SOLN
INTRAVENOUS | Status: DC | PRN
  Administered 2019-04-28: 500 mL

## 2019-04-28 MED ORDER — CHLORHEXIDINE GLUCONATE 0.12 % MT SOLN: 15.0000 mL | Freq: Once | OROMUCOSAL | Status: DC

## 2019-04-28 MED ORDER — ESCITALOPRAM OXALATE 10 MG PO TABS
10.0000 mg | ORAL_TABLET | Freq: Every day | ORAL | Status: DC
  Administered 2019-04-29 – 2019-06-09 (×42): 10 mg via ORAL
  Filled 2019-04-28 (×42): qty 1

## 2019-04-28 MED ORDER — FENTANYL CITRATE (PF) 250 MCG/5ML IJ SOLN
INTRAMUSCULAR | Status: DC | PRN
  Administered 2019-04-28: 50 ug via INTRAVENOUS
  Administered 2019-04-28: 100 ug via INTRAVENOUS
  Administered 2019-04-28: 200 ug via INTRAVENOUS
  Administered 2019-04-28 (×2): 150 ug via INTRAVENOUS

## 2019-04-28 MED ORDER — PANTOPRAZOLE SODIUM 40 MG PO TBEC
40.0000 mg | DELAYED_RELEASE_TABLET | Freq: Every day | ORAL | Status: DC
  Administered 2019-04-30 – 2019-06-09 (×40): 40 mg via ORAL
  Filled 2019-04-28 (×40): qty 1

## 2019-04-28 MED ORDER — 0.9 % SODIUM CHLORIDE (POUR BTL) OPTIME
TOPICAL | Status: DC | PRN
  Administered 2019-04-28: 1000 mL
  Administered 2019-04-28: 5000 mL
  Administered 2019-04-28: 1000 mL

## 2019-04-28 MED ORDER — SODIUM CHLORIDE 0.9% FLUSH
3.0000 mL | Freq: Two times a day (BID) | INTRAVENOUS | Status: DC
  Administered 2019-04-29 – 2019-05-01 (×3): 3 mL via INTRAVENOUS

## 2019-04-28 MED ORDER — FENTANYL CITRATE (PF) 250 MCG/5ML IJ SOLN
INTRAMUSCULAR | Status: AC
  Filled 2019-04-28: qty 25

## 2019-04-28 MED ORDER — HEMOSTATIC AGENTS (NO CHARGE) OPTIME
TOPICAL | Status: DC | PRN
  Administered 2019-04-28 (×6): 1 via TOPICAL

## 2019-04-28 MED ORDER — CHLORHEXIDINE GLUCONATE CLOTH 2 % EX PADS
6.0000 | MEDICATED_PAD | Freq: Every day | CUTANEOUS | Status: DC
  Administered 2019-04-28 – 2019-06-09 (×36): 6 via TOPICAL

## 2019-04-28 MED ORDER — ACETAMINOPHEN 650 MG RE SUPP
650.0000 mg | Freq: Once | RECTAL | Status: AC
  Administered 2019-04-28: 650 mg via RECTAL

## 2019-04-28 MED ORDER — CALCIUM CHLORIDE 10 % IV SOLN
INTRAVENOUS | Status: DC | PRN
  Administered 2019-04-28: .2 g via INTRAVENOUS
  Administered 2019-04-28: .5 g via INTRAVENOUS
  Administered 2019-04-28: .3 g via INTRAVENOUS

## 2019-04-28 MED ORDER — SODIUM CHLORIDE 0.9 % IV SOLN: 250.0000 mL | INTRAVENOUS | Status: DC

## 2019-04-28 MED ORDER — SODIUM CHLORIDE 0.9% FLUSH: 3.0000 mL | INTRAVENOUS | Status: DC | PRN

## 2019-04-28 MED ORDER — LIDOCAINE 2% (20 MG/ML) 5 ML SYRINGE
INTRAMUSCULAR | Status: AC
  Filled 2019-04-28: qty 5

## 2019-04-28 MED ORDER — METOPROLOL TARTRATE 12.5 MG HALF TABLET: 12.5000 mg | ORAL_TABLET | Freq: Once | ORAL | Status: DC

## 2019-04-28 MED ORDER — SODIUM CHLORIDE 0.9 % IV SOLN: INTRAVENOUS | Status: DC

## 2019-04-28 MED ORDER — SODIUM CHLORIDE 0.9 % IV SOLN: INTRAVENOUS | Status: DC | PRN

## 2019-04-28 MED ORDER — DEXTROSE 50 % IV SOLN: 0.0000 mL | INTRAVENOUS | Status: DC | PRN

## 2019-04-28 MED ORDER — PROTAMINE SULFATE 10 MG/ML IV SOLN
INTRAVENOUS | Status: AC
  Filled 2019-04-28: qty 25

## 2019-04-28 MED ORDER — ONDANSETRON HCL 4 MG/2ML IJ SOLN: 4.0000 mg | Freq: Once | INTRAMUSCULAR | Status: DC | PRN

## 2019-04-28 MED ORDER — TEMAZEPAM 15 MG PO CAPS: 15.0000 mg | ORAL_CAPSULE | Freq: Once | ORAL | Status: DC | PRN

## 2019-04-28 MED ORDER — ARTIFICIAL TEARS OPHTHALMIC OINT
TOPICAL_OINTMENT | OPHTHALMIC | Status: DC | PRN
  Administered 2019-04-28: 1 via OPHTHALMIC

## 2019-04-28 MED ORDER — MIDAZOLAM HCL 5 MG/5ML IJ SOLN
INTRAMUSCULAR | Status: DC | PRN
  Administered 2019-04-28: 1 mg via INTRAVENOUS
  Administered 2019-04-28 (×2): 2 mg via INTRAVENOUS
  Administered 2019-04-28 (×2): 1 mg via INTRAVENOUS

## 2019-04-28 MED ORDER — ROCURONIUM BROMIDE 10 MG/ML (PF) SYRINGE
PREFILLED_SYRINGE | INTRAVENOUS | Status: DC | PRN
  Administered 2019-04-28: 40 mg via INTRAVENOUS
  Administered 2019-04-28: 50 mg via INTRAVENOUS
  Administered 2019-04-28: 100 mg via INTRAVENOUS
  Administered 2019-04-28: 40 mg via INTRAVENOUS

## 2019-04-28 MED ORDER — METOPROLOL TARTRATE 25 MG/10 ML ORAL SUSPENSION
12.5000 mg | Freq: Two times a day (BID) | ORAL | Status: DC
  Administered 2019-04-30: 12.5 mg
  Filled 2019-04-28 (×28): qty 5

## 2019-04-28 MED ORDER — BISACODYL 10 MG RE SUPP: 10.0000 mg | Freq: Every day | RECTAL | Status: DC

## 2019-04-28 MED ORDER — LORAZEPAM 1 MG PO TABS
1.0000 mg | ORAL_TABLET | ORAL | Status: DC | PRN
  Administered 2019-04-29 – 2019-05-01 (×3): 1 mg via ORAL
  Filled 2019-04-28 (×3): qty 1

## 2019-04-28 MED ORDER — SODIUM BICARBONATE 8.4 % IV SOLN
50.0000 meq | Freq: Once | INTRAVENOUS | Status: AC
  Administered 2019-04-28: 50 meq via INTRAVENOUS

## 2019-04-28 MED ORDER — CHLORHEXIDINE GLUCONATE 0.12 % MT SOLN
15.0000 mL | OROMUCOSAL | Status: AC
  Administered 2019-04-28: 15 mL via OROMUCOSAL

## 2019-04-28 MED ORDER — ASPIRIN EC 325 MG PO TBEC
325.0000 mg | DELAYED_RELEASE_TABLET | Freq: Every day | ORAL | Status: DC
  Administered 2019-04-29: 325 mg via ORAL
  Filled 2019-04-28 (×3): qty 1

## 2019-04-28 MED ORDER — INSULIN ASPART 100 UNIT/ML ~~LOC~~ SOLN
0.0000 [IU] | SUBCUTANEOUS | Status: DC
  Administered 2019-04-28: 2 [IU] via SUBCUTANEOUS

## 2019-04-28 MED ORDER — BISACODYL 5 MG PO TBEC
10.0000 mg | DELAYED_RELEASE_TABLET | Freq: Every day | ORAL | Status: DC
  Administered 2019-04-29 – 2019-06-01 (×7): 10 mg via ORAL
  Filled 2019-04-28 (×22): qty 2

## 2019-04-28 MED ORDER — NITROGLYCERIN IN D5W 200-5 MCG/ML-% IV SOLN: 0.0000 ug/min | INTRAVENOUS | Status: DC

## 2019-04-28 MED ORDER — NOREPINEPHRINE 4 MG/250ML-% IV SOLN: 10.0000 ug/min | INTRAVENOUS | Status: DC

## 2019-04-28 MED ORDER — ACETAMINOPHEN 500 MG PO TABS
1000.0000 mg | ORAL_TABLET | Freq: Four times a day (QID) | ORAL | Status: AC
  Administered 2019-04-28 – 2019-05-03 (×15): 1000 mg via ORAL
  Filled 2019-04-28 (×15): qty 2

## 2019-04-28 MED ORDER — NOREPINEPHRINE 4 MG/250ML-% IV SOLN: 0.0000 ug/min | INTRAVENOUS | Status: DC

## 2019-04-28 MED ORDER — DOPAMINE-DEXTROSE 3.2-5 MG/ML-% IV SOLN: 3.0000 ug/kg/min | INTRAVENOUS | Status: DC

## 2019-04-28 MED ORDER — DOCUSATE SODIUM 100 MG PO CAPS
200.0000 mg | ORAL_CAPSULE | Freq: Every day | ORAL | Status: DC
  Administered 2019-04-29 – 2019-06-01 (×22): 200 mg via ORAL
  Filled 2019-04-28 (×28): qty 2

## 2019-04-28 MED ORDER — TRAMADOL HCL 50 MG PO TABS
50.0000 mg | ORAL_TABLET | ORAL | Status: DC | PRN
  Administered 2019-04-29 – 2019-05-01 (×4): 100 mg via ORAL
  Filled 2019-04-28 (×5): qty 2

## 2019-04-28 MED ORDER — DOPAMINE-DEXTROSE 1.6-5 MG/ML-% IV SOLN
INTRAVENOUS | Status: DC | PRN
  Administered 2019-04-28: 3 ug/kg/min via INTRAVENOUS

## 2019-04-28 MED ORDER — METOPROLOL TARTRATE 5 MG/5ML IV SOLN: 2.5000 mg | INTRAVENOUS | Status: DC | PRN

## 2019-04-28 MED ORDER — PHENYLEPHRINE HCL-NACL 20-0.9 MG/250ML-% IV SOLN: 0.0000 ug/min | INTRAVENOUS | Status: DC

## 2019-04-28 MED ORDER — PROPOFOL 10 MG/ML IV BOLUS
INTRAVENOUS | Status: DC | PRN
  Administered 2019-04-28: 40 mg via INTRAVENOUS

## 2019-04-28 MED ORDER — ALBUMIN HUMAN 5 % IV SOLN
250.0000 mL | INTRAVENOUS | Status: AC | PRN
  Administered 2019-04-28 (×2): 12.5 g via INTRAVENOUS

## 2019-04-28 MED ORDER — HEPARIN SODIUM (PORCINE) 1000 UNIT/ML IJ SOLN
INTRAMUSCULAR | Status: AC
  Filled 2019-04-28: qty 1

## 2019-04-28 MED ORDER — BISACODYL 5 MG PO TBEC: 5.0000 mg | DELAYED_RELEASE_TABLET | Freq: Once | ORAL | Status: DC

## 2019-04-28 MED ORDER — LACTATED RINGERS IV SOLN: INTRAVENOUS | Status: DC

## 2019-04-28 MED ORDER — MORPHINE SULFATE (PF) 2 MG/ML IV SOLN
1.0000 mg | INTRAVENOUS | Status: DC | PRN
  Administered 2019-04-28: 4 mg via INTRAVENOUS
  Administered 2019-04-28 – 2019-05-01 (×2): 2 mg via INTRAVENOUS
  Filled 2019-04-28 (×2): qty 2
  Filled 2019-04-28: qty 1

## 2019-04-28 MED ORDER — SODIUM CHLORIDE 0.45 % IV SOLN: INTRAVENOUS | Status: DC | PRN

## 2019-04-28 MED ORDER — QUETIAPINE FUMARATE 25 MG PO TABS
25.0000 mg | ORAL_TABLET | Freq: Every day | ORAL | Status: DC
  Administered 2019-04-29 – 2019-06-08 (×41): 25 mg via ORAL
  Filled 2019-04-28 (×41): qty 1

## 2019-04-28 MED ORDER — MIDAZOLAM HCL (PF) 10 MG/2ML IJ SOLN
INTRAMUSCULAR | Status: AC
  Filled 2019-04-28: qty 2

## 2019-04-28 MED ORDER — MILRINONE LACTATE IN DEXTROSE 20-5 MG/100ML-% IV SOLN
INTRAVENOUS | Status: DC | PRN
  Administered 2019-04-28: .3 ug/kg/min via INTRAVENOUS

## 2019-04-28 MED ORDER — NOREPINEPHRINE 16 MG/250ML-% IV SOLN: 0.0000 ug/min | INTRAVENOUS | Status: DC

## 2019-04-28 MED ORDER — OXYCODONE HCL 5 MG PO TABS
5.0000 mg | ORAL_TABLET | ORAL | Status: DC | PRN
  Administered 2019-04-29 – 2019-05-01 (×8): 10 mg via ORAL
  Administered 2019-05-02 – 2019-05-03 (×5): 5 mg via ORAL
  Administered 2019-05-03: 10 mg via ORAL
  Administered 2019-05-03 – 2019-05-04 (×2): 5 mg via ORAL
  Filled 2019-04-28 (×3): qty 2
  Filled 2019-04-28 (×5): qty 1
  Filled 2019-04-28: qty 2
  Filled 2019-04-28: qty 1
  Filled 2019-04-28: qty 2
  Filled 2019-04-28: qty 1
  Filled 2019-04-28: qty 2
  Filled 2019-04-28: qty 1
  Filled 2019-04-28 (×2): qty 2

## 2019-04-28 MED ORDER — ONDANSETRON HCL 4 MG/2ML IJ SOLN
4.0000 mg | Freq: Four times a day (QID) | INTRAMUSCULAR | Status: DC | PRN
  Administered 2019-04-30: 19:00:00 4 mg via INTRAVENOUS
  Filled 2019-04-28: qty 2

## 2019-04-28 MED ORDER — DEXMEDETOMIDINE HCL IN NACL 400 MCG/100ML IV SOLN
0.0000 ug/kg/h | INTRAVENOUS | Status: DC
  Administered 2019-04-28: 0.2 ug/kg/h via INTRAVENOUS
  Filled 2019-04-28: qty 100

## 2019-04-28 MED ORDER — HEPARIN SODIUM (PORCINE) 1000 UNIT/ML IJ SOLN
INTRAMUSCULAR | Status: DC | PRN
  Administered 2019-04-28: 25000 [IU] via INTRAVENOUS

## 2019-04-28 MED ORDER — ACETAMINOPHEN 160 MG/5ML PO SOLN
1000.0000 mg | Freq: Four times a day (QID) | ORAL | Status: AC
  Administered 2019-04-30 – 2019-05-01 (×4): 1000 mg
  Filled 2019-04-28 (×5): qty 40.6

## 2019-04-28 MED ORDER — SODIUM BICARBONATE 4.2 % IV SOLN: 50.0000 meq | Freq: Once | INTRAVENOUS | Status: DC

## 2019-04-28 MED ORDER — PROTAMINE SULFATE 10 MG/ML IV SOLN
INTRAVENOUS | Status: DC | PRN
  Administered 2019-04-28: 250 mg via INTRAVENOUS

## 2019-04-28 SURGICAL SUPPLY — 134 items
ADAPTER CARDIO PERF ANTE/RETRO (ADAPTER) ×4 IMPLANT
APPLICATOR COTTON TIP 6 STRL (MISCELLANEOUS) IMPLANT
APPLICATOR COTTON TIP 6IN STRL (MISCELLANEOUS) IMPLANT
BAG DECANTER FOR FLEXI CONT (MISCELLANEOUS) ×3 IMPLANT
BLADE CLIPPER SURG (BLADE) ×2 IMPLANT
BLADE STERNUM SYSTEM 6 (BLADE) ×3 IMPLANT
BLADE SURG 15 STRL LF DISP TIS (BLADE) ×2 IMPLANT
BLADE SURG 15 STRL SS (BLADE) ×2
BOOT SUTURE AID YELLOW STND (SUTURE) IMPLANT
CANISTER SUCT 3000ML PPV (MISCELLANEOUS) ×3 IMPLANT
CANN PRFSN .5XCNCT 15X34-48 (MISCELLANEOUS) ×2
CANN PRFSN 3/8X14X24FR PCFC (MISCELLANEOUS) ×2
CANNULA AORTIC ROOT 9FR (CANNULA) ×1 IMPLANT
CANNULA ARTERIAL NVNT 3/8 20FR (MISCELLANEOUS) ×1 IMPLANT
CANNULA ARTERIAL NVNT 3/8 22FR (MISCELLANEOUS) IMPLANT
CANNULA GUNDRY RCSP 15FR (MISCELLANEOUS) ×4 IMPLANT
CANNULA PRFSN .5XCNCT 15X34-48 (MISCELLANEOUS) ×2 IMPLANT
CANNULA PRFSN 3/8X14X24FR PCFC (MISCELLANEOUS) IMPLANT
CANNULA SUMP PERICARDIAL (CANNULA) ×3 IMPLANT
CANNULA VEN 2 STAGE (MISCELLANEOUS) ×1
CANNULA VEN MTL TIP RT (MISCELLANEOUS) ×1
CANNULA VENNOUS METAL TIP 20FR (CANNULA) ×1 IMPLANT
CATH CPB KIT GERHARDT (MISCELLANEOUS) ×3 IMPLANT
CATH HEART VENT LEFT (CATHETERS) ×2 IMPLANT
CATH RETROPLEGIA CORONARY 14FR (CATHETERS) IMPLANT
CATH ROBINSON RED A/P 18FR (CATHETERS) IMPLANT
CATH THORACIC 28FR (CATHETERS) ×4 IMPLANT
CATH/SQUID NICHOLS JEHLE COR (CATHETERS) ×1 IMPLANT
CLIP FOGARTY SPRING 6M (CLIP) IMPLANT
CNTNR URN SCR LID CUP LEK RST (MISCELLANEOUS) IMPLANT
CONN 1/2X1/2X1/2  BEN (MISCELLANEOUS) ×1
CONN 1/2X1/2X1/2 BEN (MISCELLANEOUS) ×2 IMPLANT
CONN 1/2X3/8X3/8 Y GISH (MISCELLANEOUS) ×1 IMPLANT
CONN 3/8X1/2 ST GISH (MISCELLANEOUS) ×4 IMPLANT
CONN ST 1/4X3/8  BEN (MISCELLANEOUS) ×2
CONN ST 1/4X3/8 BEN (MISCELLANEOUS) IMPLANT
CONN Y 3/8X3/8X3/8  BEN (MISCELLANEOUS)
CONN Y 3/8X3/8X3/8 BEN (MISCELLANEOUS) IMPLANT
CONT SPEC 4OZ STRL OR WHT (MISCELLANEOUS) ×3
DRAIN CHANNEL 28F RND 3/8 FF (WOUND CARE) ×2 IMPLANT
DRAIN CHANNEL 32F RND 10.7 FF (WOUND CARE) ×4 IMPLANT
DRAPE CARDIOVASCULAR INCISE (DRAPES) ×1
DRAPE SLUSH/WARMER DISC (DRAPES) ×3 IMPLANT
DRAPE SRG 135X102X78XABS (DRAPES) ×2 IMPLANT
DRSG AQUACEL AG ADV 3.5X14 (GAUZE/BANDAGES/DRESSINGS) ×7 IMPLANT
ELECT BLADE 4.0 EZ CLEAN MEGAD (MISCELLANEOUS) ×6
ELECT CAUTERY BLADE 6.4 (BLADE) ×3 IMPLANT
ELECT REM PT RETURN 9FT ADLT (ELECTROSURGICAL) ×6
ELECTRODE BLDE 4.0 EZ CLN MEGD (MISCELLANEOUS) ×2 IMPLANT
ELECTRODE REM PT RTRN 9FT ADLT (ELECTROSURGICAL) ×4 IMPLANT
FELT TEFLON 1X6 (MISCELLANEOUS) ×5 IMPLANT
FILTER SMOKE EVAC ULPA (FILTER) ×3 IMPLANT
GAUZE SPONGE 4X4 12PLY STRL (GAUZE/BANDAGES/DRESSINGS) ×6 IMPLANT
GAUZE SPONGE 4X4 12PLY STRL LF (GAUZE/BANDAGES/DRESSINGS) ×1 IMPLANT
GLOVE BIO SURGEON STRL SZ 6 (GLOVE) IMPLANT
GLOVE BIO SURGEON STRL SZ 6.5 (GLOVE) ×16 IMPLANT
GLOVE BIO SURGEON STRL SZ7 (GLOVE) IMPLANT
GLOVE BIOGEL PI IND STRL 6.5 (GLOVE) IMPLANT
GLOVE BIOGEL PI IND STRL 7.5 (GLOVE) IMPLANT
GLOVE BIOGEL PI IND STRL 8.5 (GLOVE) IMPLANT
GLOVE BIOGEL PI INDICATOR 6.5 (GLOVE) ×2
GLOVE BIOGEL PI INDICATOR 7.5 (GLOVE) ×3
GLOVE BIOGEL PI INDICATOR 8.5 (GLOVE) ×1
GLOVE SURG SS PI 6.0 STRL IVOR (GLOVE) ×1 IMPLANT
GOWN STRL REUS W/ TWL LRG LVL3 (GOWN DISPOSABLE) ×8 IMPLANT
GOWN STRL REUS W/TWL LRG LVL3 (GOWN DISPOSABLE) ×8
HEMOSTAT POWDER SURGIFOAM 1G (HEMOSTASIS) ×9 IMPLANT
HEMOSTAT SURGICEL 2X14 (HEMOSTASIS) ×3 IMPLANT
INSERT FOGARTY XLG (MISCELLANEOUS) IMPLANT
KIT BASIN OR (CUSTOM PROCEDURE TRAY) ×3 IMPLANT
KIT SUCTION CATH 14FR (SUCTIONS) ×10 IMPLANT
KIT TURNOVER KIT B (KITS) ×3 IMPLANT
LEAD PACING MYOCARDI (MISCELLANEOUS) ×3 IMPLANT
LINE VENT (MISCELLANEOUS) ×1 IMPLANT
LOOP VESSEL SUPERMAXI WHITE (MISCELLANEOUS) ×2 IMPLANT
NS IRRIG 1000ML POUR BTL (IV SOLUTION) ×18 IMPLANT
PACK E OPEN HEART (SUTURE) ×3 IMPLANT
PACK OPEN HEART (CUSTOM PROCEDURE TRAY) ×3 IMPLANT
PAD ARMBOARD 7.5X6 YLW CONV (MISCELLANEOUS) ×6 IMPLANT
PENCIL SMOKE EVACUATOR (MISCELLANEOUS) ×4 IMPLANT
POSITIONER HEAD DONUT 9IN (MISCELLANEOUS) ×3 IMPLANT
SEALANT SURG COSEAL 8ML (VASCULAR PRODUCTS) ×1 IMPLANT
SET CARDIOPLEGIA MPS 5001102 (MISCELLANEOUS) ×1 IMPLANT
SLEEVE SUCTION 125 (MISCELLANEOUS) ×4 IMPLANT
SURGIFLO W/THROMBIN 8M KIT (HEMOSTASIS) ×1 IMPLANT
SUT BONE WAX W31G (SUTURE) ×3 IMPLANT
SUT ETHIBON 2 0 V 52N 30 (SUTURE) ×8 IMPLANT
SUT ETHIBOND 2 0 SH (SUTURE) ×8 IMPLANT
SUT ETHIBOND 2 0 SH 36X2 (SUTURE) ×8 IMPLANT
SUT ETHIBOND 2 0 V4 (SUTURE) IMPLANT
SUT ETHIBOND 2 0V4 GREEN (SUTURE) IMPLANT
SUT PROLENE 3 0 RB 1 (SUTURE) ×4 IMPLANT
SUT PROLENE 3 0 SH 1 (SUTURE) ×4 IMPLANT
SUT PROLENE 3 0 SH1 36 (SUTURE) ×5 IMPLANT
SUT PROLENE 4 0 RB 1 (SUTURE) ×10
SUT PROLENE 4 0 TF (SUTURE) ×6 IMPLANT
SUT PROLENE 4-0 RB1 .5 CRCL 36 (SUTURE) ×4 IMPLANT
SUT PROLENE 5 0 C 1 36 (SUTURE) ×10 IMPLANT
SUT PROLENE 5 0 CC1 (SUTURE) IMPLANT
SUT SILK  1 MH (SUTURE) ×3
SUT SILK 1 MH (SUTURE) ×4 IMPLANT
SUT SILK 1 TIES 10X30 (SUTURE) ×3 IMPLANT
SUT SILK 2 0 (SUTURE) ×1
SUT SILK 2 0 SH CR/8 (SUTURE) ×6 IMPLANT
SUT SILK 2-0 18XBRD TIE 12 (SUTURE) ×2 IMPLANT
SUT SILK 3 0 SH CR/8 (SUTURE) ×3 IMPLANT
SUT SILK 4 0 (SUTURE)
SUT SILK 4-0 18XBRD TIE 12 (SUTURE) ×2 IMPLANT
SUT STEEL 6MS V (SUTURE) ×3 IMPLANT
SUT STEEL SZ 6 DBL 3X14 BALL (SUTURE) ×3 IMPLANT
SUT TEM PAC WIRE 2 0 SH (SUTURE) ×12 IMPLANT
SUT VIC AB 1 CTX 18 (SUTURE) ×8 IMPLANT
SUT VIC AB 2-0 CTX 27 (SUTURE) IMPLANT
SUT VIC AB 3-0 X1 27 (SUTURE) IMPLANT
SWAB CULTURE LIQ STUART DBL (MISCELLANEOUS) ×1 IMPLANT
SYSTEM SAHARA CHEST DRAIN ATS (WOUND CARE) ×4 IMPLANT
TAPE CLOTH SURG 4X10 WHT LF (GAUZE/BANDAGES/DRESSINGS) ×1 IMPLANT
TAPE PAPER 2X10 WHT MICROPORE (GAUZE/BANDAGES/DRESSINGS) ×1 IMPLANT
TAPE UMBILICAL COTTON 1/8X30 (MISCELLANEOUS) ×1 IMPLANT
TOWEL GREEN STERILE (TOWEL DISPOSABLE) ×3 IMPLANT
TOWEL GREEN STERILE FF (TOWEL DISPOSABLE) ×4 IMPLANT
TRAP FLUID SMOKE EVACUATOR (MISCELLANEOUS) ×3 IMPLANT
TRAY FOLEY SLVR 14FR TEMP STAT (SET/KITS/TRAYS/PACK) ×2 IMPLANT
TRAY FOLEY SLVR 16FR TEMP STAT (SET/KITS/TRAYS/PACK) ×3 IMPLANT
TUBE CONNECTING 12X1/4 (SUCTIONS) ×1 IMPLANT
TUBE CONNECTING 20X1/4 (TUBING) ×1 IMPLANT
TUBE FEEDING 8FR 16IN STR KANG (MISCELLANEOUS) ×2 IMPLANT
TUBE SUCT INTRACARD DLP 20F (MISCELLANEOUS) ×1 IMPLANT
TUBE SUCTION CARDIAC 10FR (CANNULA) ×1 IMPLANT
UNDERPAD 30X30 (UNDERPADS AND DIAPERS) ×3 IMPLANT
VALVE MAGNA EASE AORTIC 19MM (Prosthesis & Implant Heart) ×1 IMPLANT
VENT LEFT HEART 12002 (CATHETERS) ×6
WATER STERILE IRR 1000ML POUR (IV SOLUTION) ×6 IMPLANT
YANKAUER SUCT BULB TIP NO VENT (SUCTIONS) ×1 IMPLANT

## 2019-04-28 NOTE — Anesthesia Procedure Notes (Signed)
Central Venous Catheter Insertion Performed by: Cecile Hearing, MD, anesthesiologist Start/End4/20/2021 6:40 AM, 04/28/2019 6:45 AM Patient location: Pre-op. Preanesthetic checklist: patient identified, IV checked, site marked, risks and benefits discussed, surgical consent, monitors and equipment checked, pre-op evaluation and timeout performed Position: Trendelenburg Hand hygiene performed  and maximum sterile barriers used  Total catheter length 100. PA cath was placed.Swan type:thermodilution PA Cath depth:20 Procedure performed without using ultrasound guided technique. Attempts: 1 Patient tolerated the procedure well with no immediate complications.

## 2019-04-28 NOTE — Significant Event (Signed)
Patient is very anxious and restless, with RR in the 50s during ventilatory wean. Patient kept requesting for water. Precedex restarted and adjusted as needed to ease with anxiety. Morphine 2mg  given.   Patient extubated at 1900pm, with precedex, family remain at beside throughout process. Updated on plan of care.   Lauren Reyes

## 2019-04-28 NOTE — Progress Notes (Signed)
Pre Procedure note for inpatients:   Lauren Reyes has been scheduled for Procedure(s): AORTIC VALVE REPLACEMENT (AVR) (N/A) TRICUSPID VALVE REPAIR (N/A) possible MITRAL VALVE REPAIR OR REPLACEMENT (MVR) (N/A) possible homograft ASCENDING AORTIC ROOT REPLACEMENT (N/A) TRANSESOPHAGEAL ECHOCARDIOGRAM (TEE) (N/A) POSSIBLE PLACEMENT OF EPICARDIAL PERMANENT PACER LEAD (N/A) today. The various methods of treatment have been discussed with the patient. After consideration of the risks, benefits and treatment options the patient has consented to the planned procedure.   The patient has been seen and labs reviewed. There are no changes in the patient's condition to prevent proceeding with the planned procedure today.  Recent labs:  Lab Results  Component Value Date   WBC 4.5 04/27/2019   HGB 11.9 (L) 04/27/2019   HCT 36.8 04/27/2019   PLT 305 04/27/2019   GLUCOSE 96 04/27/2019   CHOL 163 04/02/2019   TRIG 258 (H) 04/02/2019   HDL <10 (L) 04/02/2019   LDLDIRECT 42.9 04/03/2019   LDLCALC NOT CALCULATED 04/02/2019   ALT 15 04/27/2019   AST 23 04/27/2019   NA 137 04/27/2019   K 3.6 04/27/2019   CL 103 04/27/2019   CREATININE 0.61 04/27/2019   BUN 12 04/27/2019   CO2 24 04/27/2019   TSH 0.879 04/02/2019   INR 1.3 (H) 04/27/2019   HGBA1C 5.9 (H) 04/02/2019    Delight Ovens, MD 04/28/2019 7:07 AM

## 2019-04-28 NOTE — Transfer of Care (Signed)
Immediate Anesthesia Transfer of Care Note  Patient: Lauren Reyes  Procedure(s) Performed: AORTIC VALVE REPLACEMENT (AVR) using Edwards PERIMOUNT Magna Ease Pericardial Bioprosthesis - 29 MM Aortic Valve. (N/A Chest) REPAIR OF TRICUSPID (N/A Chest) TRANSESOPHAGEAL ECHOCARDIOGRAM (TEE) (N/A )  Patient Location: SICU  Anesthesia Type:General  Level of Consciousness: sedated, unresponsive and Patient remains intubated per anesthesia plan  Airway & Oxygen Therapy: Patient remains intubated per anesthesia plan and Patient placed on Ventilator (see vital sign flow sheet for setting)  Post-op Assessment: Report given to RN and Post -op Vital signs reviewed and stable  Post vital signs: Reviewed and stable  Last Vitals:  Vitals Value Taken Time  BP    Temp    Pulse    Resp    SpO2      Last Pain:  Vitals:   04/28/19 0322  TempSrc: Oral  PainSc:          Complications: No apparent anesthesia complications

## 2019-04-28 NOTE — Anesthesia Procedure Notes (Signed)
Arterial Line Insertion Start/End4/20/2021 6:40 AM, 04/28/2019 6:48 AM Performed by: Jeani Hawking, CRNA, CRNA  Patient location: Pre-op. Preanesthetic checklist: patient identified, IV checked, site marked, risks and benefits discussed, surgical consent, monitors and equipment checked, pre-op evaluation, timeout performed and anesthesia consent Lidocaine 1% used for infiltration Left, radial was placed Catheter size: 20 G Maximum sterile barriers used   Attempts: 1 Procedure performed without using ultrasound guided technique. Following insertion, dressing applied and Biopatch. Post procedure assessment: normal and unchanged  Patient tolerated the procedure well with no immediate complications.

## 2019-04-28 NOTE — Brief Op Note (Signed)
      [address]       [place] [address]             [phone]     04/01/2019 - 04/28/2019  4:33 PM  PATIENT:  Jodee Wanjiku Wilbourne  28 y.o. female  PRE-OPERATIVE DIAGNOSIS:  endocarditis  POST-OPERATIVE DIAGNOSIS:  endocarditis  PROCEDURE:  Procedure(s): AORTIC VALVE REPLACEMENT (AVR) using Edwards PERIMOUNT Magna Ease Pericardial Bioprosthesis - 19 MM Aortic Valve. (N/A) TRICUSPID VALVE REPAIR (N/A) TRANSESOPHAGEAL ECHOCARDIOGRAM (TEE) (N/A)   SURGEON:  Surgeon(s) and Role:    * Delight Ovens, MD - Primary  PHYSICIAN ASSISTANT:   Jari Favre, PA-C   ANESTHESIA:   general  EBL:  400   BLOOD ADMINISTERED:none  DRAINS: right and left pleual chest tubes, mediastinal chest tubes   LOCAL MEDICATIONS USED:  NONE  SPECIMEN:  Source of Specimen:  aortic valve leaflets, tricuspid valve vegetation  DISPOSITION OF SPECIMEN:  PATHOLOGY  COUNTS:  YES  DICTATION: .Dragon Dictation  PLAN OF CARE: Admit to inpatient   PATIENT DISPOSITION:  ICU - intubated and hemodynamically stable.   Delay start of Pharmacological VTE agent (>24hrs) due to surgical blood loss or risk of bleeding: yes

## 2019-04-28 NOTE — Progress Notes (Addendum)
EVENING ROUNDS NOTE :     [address]       [place] [address]             [phone]                 Day of Surgery Procedure(s) (LRB): AORTIC VALVE REPLACEMENT (AVR) using [facility] Ease Pericardial Bioprosthesis - 29 MM Aortic Valve. (N/A) REPAIR OF TRICUSPID (N/A) TRANSESOPHAGEAL ECHOCARDIOGRAM (TEE) (N/A)  Total Length of Stay:  LOS: 27 days  BP 98/63   Pulse 85   Temp 97.9 F (36.6 C) (Core (Comment))   Resp (!) 36   Ht 5\' 5"  (1.651 m)   Wt 72.3 kg   LMP 04/05/2019 Comment: Neg Preg Test on 04/03/19  SpO2 100%   BMI 26.52 kg/m   .Intake/Output      04/19 0701 - 04/20 0700 04/20 0701 - 04/21 0700   P.O. 480    I.V. (mL/kg)  2629.6 (36.4)   Blood  439   NG/GT  0   IV Piggyback  920.7   Total Intake(mL/kg) 480 (6.6) 3989.3 (55.2)   Urine (mL/kg/hr)  1055 (1.2)   Emesis/NG output  0   Chest Tube  170   Total Output  1225   Net +480 +2764.3        Urine Occurrence 2 x    Stool Occurrence 1 x      . sodium chloride 20 mL/hr at 04/28/19 1800  . [START ON 04/29/2019] sodium chloride    . sodium chloride 10 mL/hr at 04/28/19 1530  . albumin human 12.5 g (04/28/19 1600)  . cefTRIAXone (ROCEPHIN)  IV Stopped (04/27/19 2242)  . dexmedetomidine (PRECEDEX) IV infusion 0.5 mcg/kg/hr (04/28/19 1845)  . DOPamine 3 mcg/kg/min (04/28/19 1800)  . famotidine (PEPCID) IV Stopped (04/28/19 1553)  . insulin 0.6 mL/hr at 04/28/19 1800  . lactated ringers    . lactated ringers 10 mL/hr at 04/28/19 1800  . lactated ringers 20 mL/hr at 04/28/19 1800  . magnesium sulfate 20 mL/hr at 04/28/19 1800  . norepinephrine (LEVOPHED) Adult infusion 8 mcg/min (04/28/19 1800)  . phenylephrine (NEO-SYNEPHRINE) Adult infusion    . vancomycin 750 mg (04/28/19 0428)   PAP: (24-45)/(16-23) 37/16 CVP:  [11 mmHg-18 mmHg] 12 mmHg CO:  [3.8 L/min-3.9 L/min] 3.8 L/min CI:  [2.1 L/min/m2-2.2 L/min/m2] 2.1 L/min/m2  Lab Results  Component Value Date   WBC 16.7  (H) 04/28/2019   HGB 9.9 (L) 04/28/2019   HCT 29.0 (L) 04/28/2019   PLT 216 04/28/2019   GLUCOSE 96 04/27/2019   CHOL 163 04/02/2019   TRIG 258 (H) 04/02/2019   HDL <10 (L) 04/02/2019   LDLDIRECT 42.9 04/03/2019   LDLCALC NOT CALCULATED 04/02/2019   ALT 15 04/27/2019   AST 23 04/27/2019   NA 142 04/28/2019   K 3.6 04/28/2019   CL 103 04/27/2019   CREATININE 0.61 04/27/2019   BUN 12 04/27/2019   CO2 24 04/27/2019   TSH 0.879 04/02/2019   INR 1.4 (H) 04/28/2019   HGBA1C 5.9 (H) 04/02/2019   1 early post-op , nearing extubation, some anxiety  2 stable hemodynamics  Wayne E Gold, PA-C   I have seen and examined the patient and agree with the assessment and plan as outlined.  04/04/2019, MD 04/28/2019 10:20 PM

## 2019-04-28 NOTE — Procedures (Signed)
Extubation Procedure Note  Patient Details:   Name: Lauren Reyes DOB: [birth year] MRN: [id number]   Airway Documentation:    Vent end date: 04/28/19 Vent end time: 1900   Evaluation  O2 sats: stable throughout Complications: No apparent complications Patient did tolerate procedure well. Bilateral Breath Sounds: Rhonchi, Coarse crackles   Yes   Patient extubated to 4L nasal cannula.  NIF of -26 and VC of 750. Patient able to speak post extubation.  Sats and vitals currently stable.    Elyn Peers 04/28/2019, 7:05 PM

## 2019-04-28 NOTE — Progress Notes (Signed)
PROGRESS NOTE    Lauren Reyes  JSE:[id number] DOB: 05-19-1990 DOA: 04/01/2019 PCP: Patient, No Pcp Per   Brief Narrative: 29 year old female unknown past medical history, possible heroin abuse presented to ER with a code stroke, aphasia, right-sided weakness.  Patient was admitted to neurology ICU intubated after thrombectomy for left MCA M3 occlusion with recanalization complicated by dissection/pseudoaneurysm of left ICA status post stent, extubated March 27.  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 patient transferred to [facility].  Subjective:  Went to the OR early today am Patient came back postop intubated in the ICU and attending changed to CT surgery. Currently sedated. RN reports aortic valve was replaced.  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 Initially the patient was treated with Vancomycin and Unasyn.  Currently vancomycin/Rocephin as per infectious disease-last dose planned for 05/17/2019 to complete 6 weeks of therapy.  Afebrile, leukocytosis resolved. Seen by CT surgery Gerhardt: patient eventually will require aortic valve replacement, however this is complicated by recent stroke and recent stent on aspirin and Brilinta, as well as a recent decline in mental status.Per Dr. Pia Mau, surgery should be reconsidered should the patient developed worsening of heart failure syndrome and she has to be off Brilinta for 7 days.However, the patient was re-evaluated by CTS 4/18- and stopped her Kismet and underwent surgery today, please refer to operative note for more detail, currently pending.Any further plan of care as per the CTS, TRH will continue to follow peripherally  Dyspnea/tachypnea: Resolved.CT chest 4/18-mild  progression of cavitary bronchopneumonia with nodules and septic pulmonary emboli .no PE. On lasix, IS.  Acute small CVA: she had a small infarct of left MCA territory. TPA was deferred as concern for septic emboli.she is s/p leftcommon carotid arteriogram followed by complete revascularization of occluded M3 region of inferior division ofleft MCA complicated by dissection/pseudoaneurysm of left ICA-s/p placement of a 4.5 mm x 68mm pipeline flow diverter at site of focal dissection associated with a small pseudoaneurysm of distal cervical LT ICA. She has been continued on ASA and Brillinta. A1c is 5.9, VLDL was 52. She is on lipitor 80 mg daily.  Brilinta held preoperatively 4/18.  Hypokalemia resolved.  Dyslipidemia:LDL at 78, cont Lipitor.  Sinus tachycardia/tachypnea: Multifactorial drug withdrawals deconditioning, acute illness. TSH 0.8. Has been started on as needed ativan. And on low-dose Lopressor.Cardiology has advised caution with any increase in dose of beta blocker due to the patient's severe AR.  Right common femoral DVT not a anticoagulation candidate due to large right groin hematoma postprocedure and endocarditis so she has undergone IVC filter placement on March 26 by IR  Large right groin hematoma postprocedure pseudoaneurysm addressed with status post 77F Angio-Seal for hemostasis.  Monitor CBC intermittently hemoglobin is stable  Acute blood loss anemia with right groin hematoma status post 2 unit PRBC on March 27.  Seen by GI March 27 do not suspect GI bleed.  Monitor hemoglobin intermittently.  B12 deficiency continue supplement Hyponatremia resolved. Hepatitis C antibody positive negative viral load HIV screen negative RPR negative.  Need outpatient follow-up.  Polysubstance abuse UDS positive for THC, amphetamine on admission.  Suspected IV drug use with heroin and multiple needle marks bilateral upper extremities, patient endorses IV heroin use ongoing for many years  and wanting to quit.  Patient to complete IV antibiotic  supervised setting in the hospital. She will need to have a plan in place for substance abuse rehab as outpatient before she goes to surgery.  Anxiety versus drug withdrawal positive for benzodiazepines on admission.  Had episode of crying acting erratic and a pill for, appears to be responding with Ativan.  Monitor.  She has been started on a scheduled BuSpar.  Debility/deconditioning continue PT OT, encourage mobility.  DVT prophylaxis:SCD Code Status:FULL Family Communication: plan of care discussed with patient at bedside. Status is: Inpatient  Remains inpatient appropriate because:Inpatient level of care appropriate due to severity of illness, for continued IV antibiotics and surgical intervention. TCTS requesting TRH to follow up.  Dispo: The patient is from: Home              Anticipated d/c is to: Home/TBD              Anticipated d/c date is: > 3 days              Patient currently is not medically stable to d/c. Nutrition: Diet Order            Diet NPO time specified  Diet effective now              Nutrition Problem: Increased nutrient needs Etiology: acute illness Signs/Symptoms: estimated needs Interventions: Ensure Enlive (each supplement provides 350kcal and 20 grams of protein) Body mass index is 26.52 kg/m.  Consultants: Infectious disease, neurology, cardiology, cardiothoracic surgery.   Procedures:see note Microbiology:see note  Medications: Scheduled Meds: . [START ON 04/29/2019] acetaminophen  1,000 mg Oral Q6H   Or  . [START ON 04/29/2019] acetaminophen (TYLENOL) oral liquid 160 mg/5 mL  1,000 mg Per Tube Q6H  . acetaminophen (TYLENOL) oral liquid 160 mg/5 mL  650 mg Per Tube Once   Or  . acetaminophen  650 mg Rectal Once  . [START ON 04/29/2019] aspirin EC  325 mg Oral Daily   Or  . [START ON 04/29/2019] aspirin  324 mg Per Tube Daily  . [START ON 04/29/2019] bisacodyl  10 mg Oral Daily   Or   . [START ON 04/29/2019] bisacodyl  10 mg Rectal Daily  . [START ON 04/29/2019] busPIRone  7.5 mg Oral BID  . chlorhexidine  15 mL Mouth/Throat NOW  . [START ON 04/29/2019] docusate sodium  200 mg Oral Daily  . [START ON 04/29/2019] escitalopram  10 mg Oral Daily  . metoprolol tartrate  12.5 mg Oral BID   Or  . metoprolol tartrate  12.5 mg Per Tube BID  . [START ON 04/30/2019] pantoprazole  40 mg Oral Daily  . pneumococcal 23 valent vaccine  0.5 mL Intramuscular Tomorrow-1000  . [START ON 04/29/2019] QUEtiapine  25 mg Oral QHS  . [START ON 04/29/2019] sodium chloride flush  3 mL Intravenous Q12H   Continuous Infusions: . sodium chloride    . [START ON 04/29/2019] sodium chloride    . sodium chloride    . albumin human    . cefTRIAXone (ROCEPHIN)  IV 2 g (04/27/19 2212)  . dexmedetomidine (PRECEDEX) IV infusion    . DOPamine    . famotidine (PEPCID) IV    . insulin    . lactated ringers    . lactated ringers    . lactated ringers    . magnesium sulfate    . nitroGLYCERIN    . norepinephrine (LEVOPHED) Adult infusion    . phenylephrine (NEO-SYNEPHRINE) Adult infusion    . potassium chloride    .  vancomycin 750 mg (04/28/19 0428)    Antimicrobials: Anti-infectives (From admission, onward)   Start     Dose/Rate Route Frequency Ordered Stop   04/28/19 0400  vancomycin (VANCOREADY) IVPB 1250 mg/250 mL  Status:  Discontinued     1,250 mg 166.7 mL/hr over 90 Minutes Intravenous To Surgery 04/27/19 1131 04/28/19 1451   04/28/19 0400  cefUROXime (ZINACEF) 1.5 g in sodium chloride 0.9 % 100 mL IVPB     1.5 g 200 mL/hr over 30 Minutes Intravenous To Surgery 04/27/19 1131 04/28/19 0819   04/28/19 0400  cefUROXime (ZINACEF) 750 mg in sodium chloride 0.9 % 100 mL IVPB     750 mg 200 mL/hr over 30 Minutes Intravenous To Surgery 04/27/19 1131 04/28/19 1313   04/15/19 0900  vancomycin (VANCOREADY) IVPB 750 mg/150 mL     750 mg 150 mL/hr over 60 Minutes Intravenous Every 8 hours 04/15/19 0821      04/13/19 1400  vancomycin (VANCOCIN) IVPB 1000 mg/200 mL premix  Status:  Discontinued     1,000 mg 200 mL/hr over 60 Minutes Intravenous Every 8 hours 04/13/19 0839 04/15/19 0821   04/12/19 2200  vancomycin (VANCOREADY) IVPB 750 mg/150 mL  Status:  Discontinued     750 mg 150 mL/hr over 60 Minutes Intravenous Every 8 hours 04/12/19 1500 04/13/19 0839   04/10/19 1336  vancomycin (VANCOCIN) IVPB 1000 mg/200 mL premix  Status:  Discontinued     1,000 mg 200 mL/hr over 60 Minutes Intravenous Every 8 hours 04/10/19 1320 04/12/19 1500   04/08/19 1200  cefTRIAXone (ROCEPHIN) 2 g in sodium chloride 0.9 % 100 mL IVPB     2 g 200 mL/hr over 30 Minutes Intravenous Every 12 hours 04/08/19 1013 05/17/19 2359   04/08/19 0530  vancomycin (VANCOCIN) IVPB 1000 mg/200 mL premix  Status:  Discontinued     1,000 mg 200 mL/hr over 60 Minutes Intravenous Every 12 hours 04/07/19 1541 04/10/19 1320   04/07/19 1900  Ampicillin-Sulbactam (UNASYN) 3 g in sodium chloride 0.9 % 100 mL IVPB  Status:  Discontinued     3 g 200 mL/hr over 30 Minutes Intravenous Every 6 hours 04/07/19 1457 04/08/19 1013   04/07/19 1730  vancomycin (VANCOREADY) IVPB 1500 mg/300 mL     1,500 mg 150 mL/hr over 120 Minutes Intravenous  Once 04/07/19 1541 04/07/19 1937   04/04/19 1400  ceFAZolin (ANCEF) IVPB 2g/100 mL premix  Status:  Discontinued     2 g 200 mL/hr over 30 Minutes Intravenous Every 8 hours 04/04/19 1119 04/07/19 1457   04/04/19 0400  Vancomycin (VANCOCIN) 1,250 mg in sodium chloride 0.9 % 250 mL IVPB  Status:  Discontinued     1,250 mg 166.7 mL/hr over 90 Minutes Intravenous Every 12 hours 04/03/19 1635 04/04/19 1159   04/02/19 1800  ceFEPIme (MAXIPIME) 2 g in sodium chloride 0.9 % 100 mL IVPB  Status:  Discontinued     2 g 200 mL/hr over 30 Minutes Intravenous Every 8 hours 04/02/19 1218 04/04/19 1127   04/02/19 1600  vancomycin (VANCOCIN) IVPB 1000 mg/200 mL premix  Status:  Discontinued     1,000 mg 200 mL/hr over  60 Minutes Intravenous Every 12 hours 04/02/19 0100 04/03/19 1635   04/02/19 0200  piperacillin-tazobactam (ZOSYN) IVPB 3.375 g  Status:  Discontinued     3.375 g 12.5 mL/hr over 240 Minutes Intravenous Every 8 hours 04/02/19 0059 04/02/19 1219   04/02/19 0200  vancomycin (VANCOCIN) 1,500 mg in sodium chloride 0.9 %  500 mL IVPB     1,500 mg 250 mL/hr over 120 Minutes Intravenous  Once 04/02/19 0100 04/02/19 7517   04/01/19 2040  ceFAZolin (ANCEF) 2-4 GM/100ML-% IVPB    Note to Pharmacy: Channing Mutters   : cabinet override      04/01/19 2040 04/02/19 0844       Objective: Vitals: Today's Vitals   04/27/19 1602 04/27/19 1945 04/28/19 0025 04/28/19 0322  BP: 114/66 (!) 121/57 (!) 105/42 (!) 128/52  Pulse: (!) 118 (!) 114 (!) 105 (!) 114  Resp: 16 18  18   Temp: 97.7 F (36.5 C) (!) 97.2 F (36.2 C) 98.7 F (37.1 C) 98.6 F (37 C)  TempSrc: Oral Oral Oral Oral  SpO2:  100% 95% 95%  Weight:      Height:      PainSc:        Intake/Output Summary (Last 24 hours) at 04/28/2019 1514 Last data filed at 04/28/2019 1347 Gross per 24 hour  Intake 879 ml  Output 625 ml  Net 254 ml   Filed Weights   04/01/19 1900 04/01/19 2023  Weight: 72.3 kg 72.3 kg   Weight change:    Intake/Output from previous day: 04/19 0701 - 04/20 0700 In: 480 [P.O.:480] Out: -  Intake/Output this shift: Total I/O In: 639 [I.V.:200; Blood:439] Out: 625 [Urine:625]  Examination:  General exam: Sedated, intubated postop.  HEENT:Oral mucosa moist, Ear/Nose WNL grossly,dentition normal. Respiratory system: bilaterally clear,no use of accessory muscle, non tender. Cardiovascular system: S1 & S2 +, regular, No JVD. Gastrointestinal system: Abdomen, ND, BS+. Nervous System:Sedated postop. Extremities: No edema, distal peripheral pulses palpable.  Skin: No rashes,no icterus. MSK: Normal muscle bulk,tone, power  Data Reviewed: I have personally reviewed following labs and imaging studies CBC: Recent  Labs  Lab 04/22/19 1044 04/24/19 0449 04/26/19 0530 04/27/19 1123 04/28/19 1146  WBC 3.4* 4.2 4.0 4.5  --   HGB 11.0* 10.8* 10.8* 11.9* 7.5*  HCT 35.0* 34.3* 34.5* 36.8 23.4*  MCV 87.3 87.1 86.5 86.4  --   PLT 347 292 276 305 195   Basic Metabolic Panel: Recent Labs  Lab 04/22/19 1044 04/24/19 0449 04/26/19 0530 04/27/19 0713  NA 138 136 137 137  K 3.7 3.9 3.7 3.6  CL 106 102 104 103  CO2 22 25 24 24   GLUCOSE 121* 89 87 96  BUN 9 15 12 12   CREATININE 0.62 0.62 0.60 0.61  CALCIUM 8.5* 8.8* 8.7* 8.6*  MG  --  1.8 1.7  --   PHOS  --  3.5 3.8  --    GFR: Estimated Creatinine Clearance: 102.4 mL/min (by C-G formula based on SCr of 0.61 mg/dL). Liver Function Tests: Recent Labs  Lab 04/27/19 0713  AST 23  ALT 15  ALKPHOS 80  BILITOT 0.6  PROT 6.2*  ALBUMIN 2.5*   No results for input(s): LIPASE, AMYLASE in the last 168 hours. No results for input(s): AMMONIA in the last 168 hours. Coagulation Profile: Recent Labs  Lab 04/27/19 0730  INR 1.3*   Cardiac Enzymes: No results for input(s): CKTOTAL, CKMB, CKMBINDEX, TROPONINI in the last 168 hours. BNP (last 3 results) No results for input(s): PROBNP in the last 8760 hours. HbA1C: No results for input(s): HGBA1C in the last 72 hours. CBG: No results for input(s): GLUCAP in the last 168 hours. Lipid Profile: No results for input(s): CHOL, HDL, LDLCALC, TRIG, CHOLHDL, LDLDIRECT in the last 72 hours. Thyroid Function Tests: No results for input(s): TSH, T4TOTAL, FREET4,  T3FREE, THYROIDAB in the last 72 hours. Anemia Panel: No results for input(s): VITAMINB12, FOLATE, FERRITIN, TIBC, IRON, RETICCTPCT in the last 72 hours. Sepsis Labs: No results for input(s): PROCALCITON, LATICACIDVEN in the last 168 hours.  Recent Results (from the past 240 hour(s))  Surgical PCR screen     Status: None   Collection Time: 04/24/19  6:56 PM   Specimen: Nasal Mucosa; Nasal Swab  Result Value Ref Range Status   MRSA, PCR  NEGATIVE NEGATIVE Final   Staphylococcus aureus NEGATIVE NEGATIVE Final    Comment: (NOTE) The Xpert SA Assay (FDA approved for NASAL specimens in patients 26 years of age and older), is one component of a comprehensive surveillance program. It is not intended to diagnose infection nor to guide or monitor treatment. Performed at [facility], [address] [place], [place] [address]   MRSA PCR Screening     Status: None   Collection Time: 04/28/19  4:13 AM   Specimen: Nasopharyngeal  Result Value Ref Range Status   MRSA by PCR NEGATIVE NEGATIVE Final    Comment:        The GeneXpert MRSA Assay (FDA approved for NASAL specimens only), is one component of a comprehensive MRSA colonization surveillance program. It is not intended to diagnose MRSA infection nor to guide or monitor treatment for MRSA infections. Performed at [facility], [address], [place], [place] [address]   Aerobic/Anaerobic Culture (surgical/deep wound)     Status: None (Preliminary result)   Collection Time: 04/28/19  9:06 AM   Specimen: PATH Cytology Misc. fluid; Body Fluid  Result Value Ref Range Status   Specimen Description WOUND PERICARDIAL FLUID  Final   Special Requests SPEC A SENT ON SWAB  Final   Gram Stain   Final    RARE WBC PRESENT, PREDOMINANTLY MONONUCLEAR NO ORGANISMS SEEN Performed at [facility], [address], [place], [place] [address]    Culture PENDING  Incomplete   Report Status PENDING  Incomplete  Aerobic/Anaerobic Culture (surgical/deep wound)     Status: None (Preliminary result)   Collection Time: 04/28/19 10:19 AM   Specimen: Aortic Valve Leaflets; Tissue  Result Value Ref Range Status   Specimen Description AORTIC VALVE TISSUE LEAFLETS  Final   Special Requests SPECIMEN B  Final   Gram Stain   Final    FEW WBC PRESENT,BOTH PMN AND MONONUCLEAR RARE GRAM POSITIVE COCCI Performed at [facility], [address], [place], [place] [address]      Culture PENDING  Incomplete   Report Status PENDING  Incomplete      Radiology Studies: DG Chest 2 View  Result Date: 04/27/2019 CLINICAL DATA:  Pre-admission for cardiac valve repair. EXAM: CHEST - 2 VIEW COMPARISON:  Rakiya 11, 2021. FINDINGS: The heart size and mediastinal contours are within normal limits. No pneumothorax is noted. Right-sided PICC line is unchanged in position. No pleural effusion is noted. Slightly decreased lung opacities are noted consistent with improving pulmonary edema or multifocal pneumonia. The visualized skeletal structures are unremarkable. IMPRESSION: Slightly decreased lung opacities are noted consistent with improving pulmonary edema or multifocal pneumonia. Electronically Signed   By: Lupita Raider M.D.   On: 04/27/2019 09:12     LOS: 27 days   Time spent: More than 50% of that time was spent in counseling and/or coordination of care.  Lanae Boast, MD [facility]  04/28/2019, 3:14 PM

## 2019-04-28 NOTE — Anesthesia Procedure Notes (Signed)
Central Venous Catheter Insertion Performed by: Catalina Gravel, MD, anesthesiologist Start/End4/20/2021 6:30 AM, 04/28/2019 6:40 AM Patient location: Pre-op. Preanesthetic checklist: patient identified, IV checked, site marked, risks and benefits discussed, surgical consent, monitors and equipment checked, pre-op evaluation, timeout performed and anesthesia consent Position: Trendelenburg Lidocaine 1% used for infiltration and patient sedated Hand hygiene performed , maximum sterile barriers used  and Seldinger technique used Catheter size: 9 Fr Central line was placed.MAC introducer Procedure performed using ultrasound guided technique. Ultrasound Notes:anatomy identified, needle tip was noted to be adjacent to the nerve/plexus identified, no ultrasound evidence of intravascular and/or intraneural injection and image(s) printed for medical record Attempts: 1 Following insertion, line sutured, dressing applied and Biopatch. Post procedure assessment: free fluid flow, blood return through all ports and no air  Patient tolerated the procedure well with no immediate complications.

## 2019-04-28 NOTE — Interval H&P Note (Signed)
Anesthesia H&P Update: History and Physical Exam reviewed; patient is OK for planned anesthetic and procedure. ? ?

## 2019-04-28 NOTE — Anesthesia Preprocedure Evaluation (Signed)
Anesthesia Evaluation  Patient identified by MRN, date of birth, ID band Patient awake    Reviewed: Allergy & Precautions, NPO status , Patient's Chart, lab work & pertinent test results  History of Anesthesia Complications Negative for: history of anesthetic complications  Airway Mallampati: II  TM Distance: >3 FB Neck ROM: Full    Dental  (+) Dental Advisory Given, Teeth Intact   Pulmonary neg recent URI,    breath sounds clear to auscultation       Cardiovascular +CHF   Rhythm:Regular  1. No vegetation is seen on the aortic and mitral valves.  2. There appears to be a highly mobile echodensity in the RV attached to  the tricuspid valve, its only seen on images 108 and 110, size can't be  assessed. There is also highly mobile echodensity attached to the  posterior right atrial wall, this can  represent a vegetation or a thrombus. If clinically indication, a repeat  TEE is recommended.  3. Left ventricular ejection fraction, by estimation, is 55 to 60%. The  left ventricle has normal function. The left ventricle has no regional  wall motion abnormalities. The left ventricular internal cavity size was  mildly dilated. Left ventricular  diastolic function could not be evaluated.  4. Right ventricular systolic function is normal. The right ventricular  size is normal. There is moderately elevated pulmonary artery systolic  pressure. The estimated right ventricular systolic pressure is 53.7 mmHg.  5. Left atrial size was mildly dilated.  6. The mitral valve is normal in structure. Moderate to severe mitral  valve regurgitation. No evidence of mitral stenosis.  7. Tricuspid valve regurgitation is severe.  8. The aortic valve is bicuspid. Aortic valve regurgitation is severe. No  aortic stenosis is present.  9. The inferior vena cava is normal in size with greater than 50%  respiratory variability, suggesting right atrial  pressure of 3 mmHg.    Neuro/Psych CVA negative psych ROS   GI/Hepatic negative GI ROS, (+)     substance abuse  marijuana use,   Endo/Other  negative endocrine ROS  Renal/GU negative Renal ROS     Musculoskeletal   Abdominal   Peds  Hematology  (+) anemia ,   Anesthesia Other Findings   Reproductive/Obstetrics                             Anesthesia Physical Anesthesia Plan  ASA: IV  Anesthesia Plan: General   Post-op Pain Management:    Induction: Intravenous  PONV Risk Score and Plan: 3 and Treatment may vary due to age or medical condition  Airway Management Planned: Oral ETT  Additional Equipment: Arterial line, CVP and TEE  Intra-op Plan:   Post-operative Plan: Post-operative intubation/ventilation  Informed Consent: I have reviewed the patients History and Physical, chart, labs and discussed the procedure including the risks, benefits and alternatives for the proposed anesthesia with the patient or authorized representative who has indicated his/her understanding and acceptance.     Dental advisory given  Plan Discussed with: CRNA and Surgeon  Anesthesia Plan Comments:         Anesthesia Quick Evaluation

## 2019-04-29 ENCOUNTER — Inpatient Hospital Stay (HOSPITAL_COMMUNITY): Payer: Medicaid Other

## 2019-04-29 DIAGNOSIS — I639 Cerebral infarction, unspecified: Secondary | ICD-10-CM

## 2019-04-29 LAB — MAGNESIUM
Magnesium: 2 mg/dL (ref 1.7–2.4)
Magnesium: 1.9 mg/dL (ref 1.7–2.4)

## 2019-04-29 LAB — BASIC METABOLIC PANEL
Anion gap: 9 (ref 5–15)
Anion gap: 9 (ref 5–15)
BUN: 11 mg/dL (ref 6–20)
BUN: 10 mg/dL (ref 6–20)
CO2: 20 mmol/L — ABNORMAL LOW (ref 22–32)
CO2: 22 mmol/L (ref 22–32)
Calcium: 8 mg/dL — ABNORMAL LOW (ref 8.9–10.3)
Calcium: 8.6 mg/dL — ABNORMAL LOW (ref 8.9–10.3)
Chloride: 108 mmol/L (ref 98–111)
Chloride: 102 mmol/L (ref 98–111)
Creatinine, Ser: 0.49 mg/dL (ref 0.44–1.00)
Creatinine, Ser: 0.56 mg/dL (ref 0.44–1.00)
GFR calc Af Amer: >60 mL/min (ref >60)
GFR calc Af Amer: >60 mL/min (ref >60)
GFR calc non Af Amer: >60 mL/min (ref >60)
GFR calc non Af Amer: >60 mL/min (ref >60)
Glucose, Bld: 109 mg/dL — ABNORMAL HIGH (ref 70–99)
Glucose, Bld: 114 mg/dL — ABNORMAL HIGH (ref 70–99)
Potassium: 4.3 mmol/L (ref 3.5–5.1)
Potassium: 4.3 mmol/L (ref 3.5–5.1)
Sodium: 137 mmol/L (ref 135–145)
Sodium: 133 mmol/L — ABNORMAL LOW (ref 135–145)

## 2019-04-29 LAB — CBC
HCT: 28.2 % — ABNORMAL LOW (ref 36.0–46.0)
HCT: 28.5 % — ABNORMAL LOW (ref 36.0–46.0)
Hemoglobin: 8.7 g/dL — ABNORMAL LOW (ref 12.0–15.0)
Hemoglobin: 8.8 g/dL — ABNORMAL LOW (ref 12.0–15.0)
MCH: 27.1 pg (ref 26.0–34.0)
MCH: 27.6 pg (ref 26.0–34.0)
MCHC: 30.9 g/dL (ref 30.0–36.0)
MCHC: 30.9 g/dL (ref 30.0–36.0)
MCV: 87.9 fL (ref 80.0–100.0)
MCV: 89.3 fL (ref 80.0–100.0)
Platelets: 181 10*3/uL (ref 150–400)
Platelets: 172 10*3/uL (ref 150–400)
RBC: 3.21 MIL/uL — ABNORMAL LOW (ref 3.87–5.11)
RBC: 3.19 MIL/uL — ABNORMAL LOW (ref 3.87–5.11)
RDW: 18.5 % — ABNORMAL HIGH (ref 11.5–15.5)
RDW: 18.6 % — ABNORMAL HIGH (ref 11.5–15.5)
WBC: 8.7 10*3/uL (ref 4.0–10.5)
WBC: 10.7 10*3/uL — ABNORMAL HIGH (ref 4.0–10.5)
nRBC: 0 % (ref 0.0–0.2)
nRBC: 0 % (ref 0.0–0.2)

## 2019-04-29 LAB — GLUCOSE, CAPILLARY
Glucose-Capillary: 98 mg/dL (ref 70–99)
Glucose-Capillary: 95 mg/dL (ref 70–99)
Glucose-Capillary: 82 mg/dL (ref 70–99)
Glucose-Capillary: 107 mg/dL — ABNORMAL HIGH (ref 70–99)

## 2019-04-29 LAB — VANCOMYCIN, TROUGH: Vancomycin Tr: 16 ug/mL (ref 15–20)

## 2019-04-29 MED ORDER — SODIUM CHLORIDE 0.9% FLUSH: 10.0000 mL | INTRAVENOUS | Status: DC | PRN

## 2019-04-29 MED ORDER — ENOXAPARIN SODIUM 30 MG/0.3ML ~~LOC~~ SOLN
30.0000 mg | Freq: Every day | SUBCUTANEOUS | Status: DC
  Administered 2019-04-29 – 2019-05-11 (×13): 30 mg via SUBCUTANEOUS
  Filled 2019-04-29 (×13): qty 0.3

## 2019-04-29 MED ORDER — FUROSEMIDE 10 MG/ML IJ SOLN
40.0000 mg | Freq: Once | INTRAMUSCULAR | Status: AC
  Administered 2019-04-29: 40 mg via INTRAVENOUS
  Filled 2019-04-29: qty 4

## 2019-04-29 MED ORDER — SODIUM CHLORIDE 0.9% FLUSH
10.0000 mL | Freq: Two times a day (BID) | INTRAVENOUS | Status: DC
  Administered 2019-04-29 – 2019-05-01 (×3): 10 mL

## 2019-04-29 MED FILL — Potassium Chloride Inj 2 mEq/ML: INTRAVENOUS | Qty: 40 | Status: AC

## 2019-04-29 MED FILL — Heparin Sodium (Porcine) Inj 1000 Unit/ML: INTRAMUSCULAR | Qty: 30 | Status: AC

## 2019-04-29 MED FILL — Magnesium Sulfate Inj 50%: INTRAMUSCULAR | Qty: 10 | Status: AC

## 2019-04-29 NOTE — Consult Note (Signed)
Pathology report from Dr. Kenard Gower with one fragment with CMV infection and otherwise with bacterial colonization.  I will check a serum CMV PCR to see if there is anything significant, though is atypical, particularly with septic emboli.  Full consult to follow.  Gardiner Barefoot, MD

## 2019-04-29 NOTE — Progress Notes (Signed)
Pharmacy Antibiotic Note  Lauren Reyes is a 29 y.o. female with endocarditis involving aortic, mitral, and tricuspid valves with severe aortic regurgitation. Patient's condition is further complicated by septic emboli to her brain, lungs, and spleen. Respiratory culture grew MSSA, however, recent blood cultures are negative. She is s/p AVR/MVR/TVR on 4/20. Valve tissue cultures pending  Noted that doses are being adjusted on troughs alone due to concern of cerebritis.  -vancomycin trough= 16 -WBC= 8.7, afebrile, SCr= 0.49  Plan: - Continue Vancomycin at 750 mg IV q 8 hours  - Consider weekly checks while renal function remains stable - Continue ceftriaxone IV 2g q12 per ID - Monitor renal function, clinical improvement, WBC, and temperature - End date for antibiotics: 05/17/19  Height: 5\' 5"  (165.1 cm) Weight: 77.2 kg (170 lb 3.1 oz) IBW/kg (Calculated) : 57  Temp (24hrs), Avg:97.8 F (36.6 C), Min:97 F (36.1 C), Max:98.6 F (37 C)  Recent Labs  Lab 04/24/19 0449 04/24/19 0449 04/24/19 1130 04/26/19 0530 04/27/19 0713 04/27/19 1123 04/28/19 1500 04/28/19 2155 04/29/19 0500 04/29/19 1100  WBC 4.2   < >  --  4.0  --  4.5 16.7* 14.0* 8.7  --   CREATININE 0.62  --   --  0.60 0.61  --   --  0.58 0.49  --   VANCOTROUGH  --   --  17  --   --   --   --   --   --  16   < > = values in this interval not displayed.    Estimated Creatinine Clearance: 105.7 mL/min (by C-G formula based on SCr of 0.49 mg/dL).    No Known Allergies  Antimicrobials this admission: Zosyn 3/25 >> 3/25 Cefepime 3/25 >> 3/27 Ancef 3/27 >> 3/30 Vanc 3/25 >> 3/27; restarted 3/30 >> (5/9) Unasyn 3/30>>3/31 Ceftriaxone 3/31>>(5/9)  Microbiology results: 3/24 COVID/Flu >> neg 3/25 RCx >> moderate MSSA 3/25 MRSA PCR >> negative 3/25 BCx >> negative   Thank you for allowing pharmacy to be a part of this patient's care.  4/25, PharmD Clinical Pharmacist **Pharmacist phone directory  can now be found on [email or web address] ([email or web address]).  Listed under [facility].

## 2019-04-29 NOTE — Progress Notes (Signed)
Patient ID: Lauren Reyes, female   DOB: 1990-07-27, 29 y.o.   MRN: [id number] [facility] DAILY ICU PROGRESS NOTE                   [address]            [place] [address]          [phone]   1 Day Post-Op Procedure(s) (LRB): AORTIC VALVE REPLACEMENT (AVR) using Edwards PERIMOUNT Magna Ease Pericardial Bioprosthesis - 29 MM Aortic Valve. (N/A) REPAIR OF TRICUSPID (N/A) TRANSESOPHAGEAL ECHOCARDIOGRAM (TEE) (N/A)  Total Length of Stay:  LOS: 28 days   Subjective: Awake and alert , neuro intact, extubated   Objective: Vital signs in last 24 hours: Temp:  [97.2 F (36.2 C)-98.6 F (37 C)] 97.2 F (36.2 C) (04/21 0700) Pulse Rate:  [75-101] 88 (04/21 0700) Cardiac Rhythm: Normal sinus rhythm;Sinus tachycardia (04/20 2000) Resp:  [12-45] 25 (04/21 0700) BP: (84-128)/(56-85) 96/66 (04/21 0700) SpO2:  [82 %-100 %] 100 % (04/21 0700) Arterial Line BP: (90-299)/(52-295) 102/58 (04/21 0700) FiO2 (%):  [0 %-100 %] 0 % (04/20 1912) Weight:  [77.2 kg] 77.2 kg (04/21 0600)  Filed Weights   04/01/19 1900 04/01/19 2023 04/29/19 0600  Weight: 72.3 kg 72.3 kg 77.2 kg    Weight change:    Hemodynamic parameters for last 24 hours: PAP: (21-48)/(6-23) 37/21 CVP:  [5 mmHg-18 mmHg] 10 mmHg CO:  [3.8 L/min-5.7 L/min] 3.9 L/min CI:  [2.1 L/min/m2-3.2 L/min/m2] 2.2 L/min/m2  Intake/Output from previous day: 04/20 0701 - 04/21 0700 In: 5242.3 [I.V.:3439.1; Blood:439; IV Piggyback:1364.2] Out: 2155 [Urine:1805; Emesis/NG output:10; Chest Tube:340]  Intake/Output this shift: No intake/output data recorded.  Current Meds: Scheduled Meds: . acetaminophen  1,000 mg Oral Q6H   Or  . acetaminophen (TYLENOL) oral liquid 160 mg/5 mL  1,000 mg Per Tube Q6H  . aspirin EC  325 mg Oral Daily   Or  . aspirin  324 mg Per Tube Daily  . bisacodyl  10 mg Oral Daily   Or  . bisacodyl  10 mg Rectal Daily  . busPIRone  7.5 mg Oral BID  . Chlorhexidine Gluconate Cloth  6 each  Topical Daily  . docusate sodium  200 mg Oral Daily  . escitalopram  10 mg Oral Daily  . insulin aspart  0-24 Units Subcutaneous Q4H  . metoprolol tartrate  12.5 mg Oral BID   Or  . metoprolol tartrate  12.5 mg Per Tube BID  . [START ON 04/30/2019] pantoprazole  40 mg Oral Daily  . pneumococcal 23 valent vaccine  0.5 mL Intramuscular Tomorrow-1000  . QUEtiapine  25 mg Oral QHS  . sodium chloride flush  10-40 mL Intracatheter Q12H  . sodium chloride flush  3 mL Intravenous Q12H   Continuous Infusions: . sodium chloride 20 mL/hr at 04/29/19 0700  . sodium chloride    . sodium chloride 10 mL/hr at 04/29/19 0000  . albumin human 12.5 g (04/28/19 2152)  . cefTRIAXone (ROCEPHIN)  IV Stopped (04/28/19 2344)  . dexmedetomidine (PRECEDEX) IV infusion Stopped (04/29/19 0539)  . DOPamine 3 mcg/kg/min (04/29/19 0700)  . lactated ringers    . lactated ringers Stopped (04/29/19 0230)  . lactated ringers 20 mL/hr at 04/29/19 0700  . norepinephrine (LEVOPHED) Adult infusion Stopped (04/29/19 0543)  . phenylephrine (NEO-SYNEPHRINE) Adult infusion    . vancomycin Stopped (04/29/19 0523)   PRN Meds:.sodium chloride, albumin human, lactated ringers, LORazepam, metoprolol tartrate, morphine injection, ondansetron (ZOFRAN) IV, oxyCODONE,  sodium chloride flush, sodium chloride flush, traMADol  General appearance: alert and cooperative Neurologic: intact Heart: regular rate and rhythm, S1, S2 normal, no murmur, click, rub or gallop Lungs: clear to auscultation bilaterally Abdomen: soft, non-tender; bowel sounds normal; no masses,  no organomegaly Extremities: extremities normal, atraumatic, no cyanosis or edema and Homans sign is negative, no sign of DVT Wound: sternum intact  Lab Results: CBC: Recent Labs    04/28/19 2155 04/28/19 2155 04/28/19 2205 04/29/19 0500  WBC 14.0*  --   --  8.7  HGB 9.9*   < > 10.2* 8.7*  HCT 31.7*   < > 30.0* 28.2*  PLT 202  --   --  181   < > = values in this  interval not displayed.   BMET:  Recent Labs    04/28/19 2155 04/28/19 2155 04/28/19 2205 04/29/19 0500  NA 138   < > 140 137  K 4.1   < > 4.3 4.3  CL 109  --   --  108  CO2 19*  --   --  20*  GLUCOSE 125*  --   --  109*  BUN 12  --   --  11  CREATININE 0.58  --   --  0.49  CALCIUM 7.8*  --   --  8.0*   < > = values in this interval not displayed.    CMET: Lab Results  Component Value Date   WBC 8.7 04/29/2019   HGB 8.7 (L) 04/29/2019   HCT 28.2 (L) 04/29/2019   PLT 181 04/29/2019   GLUCOSE 109 (H) 04/29/2019   CHOL 163 04/02/2019   TRIG 258 (H) 04/02/2019   HDL <10 (L) 04/02/2019   LDLDIRECT 42.9 04/03/2019   LDLCALC NOT CALCULATED 04/02/2019   ALT 15 04/27/2019   AST 23 04/27/2019   NA 137 04/29/2019   K 4.3 04/29/2019   CL 108 04/29/2019   CREATININE 0.49 04/29/2019   BUN 11 04/29/2019   CO2 20 (L) 04/29/2019   TSH 0.879 04/02/2019   INR 1.4 (H) 04/28/2019   HGBA1C 5.9 (H) 04/02/2019      PT/INR:  Recent Labs    04/28/19 1500  LABPROT 17.2*  INR 1.4*   Radiology: [facility] Chest Port 1 View  Result Date: 04/28/2019 CLINICAL DATA:  Post aortic valve repair. EXAM: PORTABLE CHEST 1 VIEW COMPARISON:  Radiographs 04/27/2019 and 04/19/2019. CT 04/19/2019. FINDINGS: 1511 hours. Tip of the endotracheal tube extends to the carina and should be withdrawn 3-4 cm for more optimal positioning. Right IJ Swan-Ganz catheter projects to the main pulmonary artery level. Enteric tube projects below the diaphragm, tip not visualized. Bilateral chest tubes and a mediastinal drain are in place. The heart size and mediastinal contours are stable post median sternotomy and aortic valve replacement. Previously demonstrated pulmonary edema has improved. There is left lower lobe atelectasis and a possible small left pleural effusion. No pneumothorax or acute osseous findings. IMPRESSION: 1. Improving pulmonary edema with left lower lobe atelectasis and possible small left pleural effusion.  2. Endotracheal tube extends to the carina and should be withdrawn 3-4 cm for more optimal positioning. Additional support system positioned as above. 3. These results were called by telephone at the time of interpretation on 04/28/2019 at 3:40 pm to the patient's nurse Hyiu, who verbally acknowledged these results. Electronically Signed   By: Carey Bullocks M.D.   On: 04/28/2019 15:42     Assessment/Plan: S/P Procedure(s) (LRB): AORTIC VALVE REPLACEMENT (AVR) using  Edwards PERIMOUNT Magna Ease Pericardial Bioprosthesis - 29 MM Aortic Valve. (N/A) REPAIR OF TRICUSPID (N/A) TRANSESOPHAGEAL ECHOCARDIOGRAM (TEE) (N/A) Mobilize Diuresis d/c tubes/lines Continue ABX therapy due to endocarditis  Expected Acute  Blood - loss Anemia- continue to monitor    Grace Isaac 04/29/2019 7:41 AM

## 2019-04-29 NOTE — Evaluation (Signed)
Physical Therapy Evaluation Patient Details Name: Lauren Reyes MRN: [id number] DOB: Feb 20, 1990 Today's Date: 04/29/2019   History of Present Illness  Pt is 29 year old female who was admitted with suspected IV drug abuse was being interviewed by police and developed right-sided facial weakness, aphasia, and R weakness.  She was brought by EMS to the emergency room work-up revealed occluded left MCA. Revascularization performed by IR. CT head and neck demonstrate multiple small cavitary abscesses in the upper lobes bilaterally. IVC filter placement for RLE DVT on 3/26. Pt on antibiotics for multivalvular endocarditis.  Underwent AVR and tricuspid valve repair on 04/28/19.  Clinical Impression  Patient presents s/p above procedure now needing increased assist for all mobility and cues for keeping sternal precautions.  She is mainly limited by pain, shallow respirations, anxiety and should progress hopefully quickly with acute skilled PT.  She may need short CIR level rehab prior to d/c to return to independent.  PT to follow.     Follow Up Recommendations Supervision/Assistance - 24 hour;CIR    Equipment Recommendations  Other (comment)(TBA)    Recommendations for Other Services Rehab consult     Precautions / Restrictions Precautions Precautions: Fall;Sternal Precaution Comments: chest tube      Mobility  Bed Mobility Overal bed mobility: Needs Assistance Bed Mobility: Rolling;Sidelying to Sit Rolling: Mod assist;+2 for physical assistance Sidelying to sit: Mod assist;+2 for safety/equipment;HOB elevated       General bed mobility comments: cues for sternal precautions and hugging pillow to chest, assist for turning and side to sit with momentum  Transfers Overall transfer level: Needs assistance Equipment used: 4-wheeled walker(eva walker) Transfers: Sit to/from Stand Sit to Stand: Mod assist;+2 safety/equipment         General transfer comment: cues for  technique, assist for lifting without UE use  Ambulation/Gait Ambulation/Gait assistance: Min assist;+2 safety/equipment Gait Distance (Feet): 80 Feet Assistive device: 4-wheeled walker(eva walker) Gait Pattern/deviations: Step-through pattern;Decreased stride length;Shuffle     General Gait Details: rushing on the way back, walked on RA and without SpO2 probe  Stairs            Wheelchair Mobility    Modified Rankin (Stroke Patients Only)       Balance Overall balance assessment: Needs assistance Sitting-balance support: Feet supported Sitting balance-Leahy Scale: Fair     Standing balance support: Bilateral upper extremity supported Standing balance-Leahy Scale: Poor Standing balance comment: weak and painful so UE support needed today                             Pertinent Vitals/Pain Pain Score: 7  Pain Location: chest Pain Descriptors / Indicators: Discomfort;Grimacing;Moaning;Aching Pain Intervention(s): Monitored during session;Repositioned;Other (comment);Limited activity within patient's tolerance(educated on splinting)    Home Living Family/patient expects to be discharged to:: Inpatient rehab Living Arrangements: Alone                    Prior Function Level of Independence: Independent               Hand Dominance        Extremity/Trunk Assessment   Upper Extremity Assessment Upper Extremity Assessment: LUE deficits/detail;RUE deficits/detail RUE Deficits / Details: limited by sternal precautions LUE Deficits / Details: limited due to sternal precautions    Lower Extremity Assessment Lower Extremity Assessment: Generalized weakness       Communication   Communication: Expressive difficulties(low volume, hard to understand)  Cognition Arousal/Alertness:  Awake/alert Behavior During Therapy: Anxious Overall Cognitive Status: Impaired/Different from baseline Area of Impairment: Safety/judgement;Attention                    Current Attention Level: Sustained   Following Commands: Follows one step commands consistently;Follows one step commands with increased time Safety/Judgement: Decreased awareness of deficits   Problem Solving: Decreased initiation;Requires verbal cues;Requires tactile cues;Slow processing        General Comments General comments (skin integrity, edema, etc.): patient tachypneic after ambulation up to 40's with shallow breaths, tried to educate/encourage and work on IS, but pt still very shallow, placed O2 on @ 1LPM, RN aware.    Exercises     Assessment/Plan    PT Assessment Patient needs continued PT services  PT Problem List Decreased activity tolerance;Decreased mobility;Decreased safety awareness;Cardiopulmonary status limiting activity;Pain;Decreased balance;Decreased knowledge of use of DME       PT Treatment Interventions DME instruction;Gait training;Stair training;Therapeutic activities;Functional mobility training;Therapeutic exercise;Balance training;Neuromuscular re-education;Patient/family education    PT Goals (Current goals can be found in the Care Plan section)  Acute Rehab PT Goals Patient Stated Goal: to improve pain PT Goal Formulation: With patient Time For Goal Achievement: 05/13/19 Potential to Achieve Goals: Good    Frequency Min 4X/week   Barriers to discharge        Co-evaluation               AM-PAC PT "6 Clicks" Mobility  Outcome Measure Help needed turning from your back to your side while in a flat bed without using bedrails?: Total Help needed moving from lying on your back to sitting on the side of a flat bed without using bedrails?: A Lot Help needed moving to and from a bed to a chair (including a wheelchair)?: A Lot Help needed standing up from a chair using your arms (e.g., wheelchair or bedside chair)?: A Lot Help needed to walk in hospital room?: A Lot Help needed climbing 3-5 steps with a railing? :  Total 6 Click Score: 10    End of Session   Activity Tolerance: Patient limited by fatigue Patient left: in chair;with call bell/phone within reach;with chair alarm set Nurse Communication: Mobility status PT Visit Diagnosis: Other abnormalities of gait and mobility (R26.89);Pain Pain - part of body: (chest)    Time: 2993-7169 PT Time Calculation (min) (ACUTE ONLY): 29 min   Charges:   PT Evaluation $PT Re-evaluation: 1 Re-eval PT Treatments $Gait Training: 8-22 mins        Sheran Lawless, [facility] [phone] 04/29/2019   Elray Mcgregor 04/29/2019, 1:52 PM

## 2019-04-29 NOTE — Progress Notes (Signed)
[facility] PROGRESS NOTE  Patient: Lauren Reyes ZOX:[id number]   PCP: Patient, No Pcp Per DOB: 1990-07-31   DOA: 04/01/2019   DOS: 04/29/2019    Subjective: Reports anxiety  Assessment and plan: Currently in the ICU under cardiothoracic surgery.  Briefly discussed with cardiothoracic surgery PA.  We will continue to monitor.  Currently, the patient is in the ICU.  Happy to take over when the patient is out of the ICU. Appreciate cardiothoracic surgery assistance.  Author: Lynden Oxford, MD [facility] 04/29/2019 4:26 PM   If 7PM-7AM, please contact night-coverage at [email or web address]

## 2019-04-29 NOTE — Plan of Care (Signed)
  Problem: Cardiac: Goal: Will achieve and/or maintain hemodynamic stability Outcome: Progressing   Problem: Respiratory: Goal: Respiratory status will improve Outcome: Progressing   Problem: Urinary Elimination: Goal: Ability to achieve and maintain adequate renal perfusion and functioning will improve Outcome: Progressing   

## 2019-04-29 NOTE — Progress Notes (Signed)
      [address]       [place] [address]             [phone]      POD # 1  Sleeping currently  Was up to chair earlier  BP 118/72   Pulse (!) 104   Temp (!) 96.6 F (35.9 C) (Axillary)   Resp (!) 29   Ht 5\' 5"  (1.651 m)   Wt 77.2 kg   LMP 04/05/2019 Comment: Neg Preg Test on 04/03/19  SpO2 99%   BMI 28.32 kg/m  Off dopamine   Intake/Output Summary (Last 24 hours) at 04/29/2019 1802 Last data filed at 04/29/2019 1600 Gross per 24 hour  Intake 1735.64 ml  Output 1335 ml  Net 400.64 ml   K= 4.3, creatinine 0.56  Doing well POD # 1  Lauren Reyes C. 05/01/2019, MD [facility] [phone]

## 2019-04-30 ENCOUNTER — Inpatient Hospital Stay (HOSPITAL_COMMUNITY): Payer: Medicaid Other

## 2019-04-30 DIAGNOSIS — Z952 Presence of prosthetic heart valve: Secondary | ICD-10-CM

## 2019-04-30 DIAGNOSIS — B259 Cytomegaloviral disease, unspecified: Secondary | ICD-10-CM

## 2019-04-30 LAB — GLUCOSE, CAPILLARY
Glucose-Capillary: 112 mg/dL — ABNORMAL HIGH (ref 70–99)
Glucose-Capillary: 84 mg/dL (ref 70–99)
Glucose-Capillary: 90 mg/dL (ref 70–99)
Glucose-Capillary: 92 mg/dL (ref 70–99)
Glucose-Capillary: 97 mg/dL (ref 70–99)
Glucose-Capillary: 97 mg/dL (ref 70–99)

## 2019-04-30 LAB — CBC
HCT: 26 % — ABNORMAL LOW (ref 36.0–46.0)
Hemoglobin: 8.1 g/dL — ABNORMAL LOW (ref 12.0–15.0)
MCH: 27.5 pg (ref 26.0–34.0)
MCHC: 31.2 g/dL (ref 30.0–36.0)
MCV: 88.1 fL (ref 80.0–100.0)
Platelets: 154 10*3/uL (ref 150–400)
RBC: 2.95 MIL/uL — ABNORMAL LOW (ref 3.87–5.11)
RDW: 18.6 % — ABNORMAL HIGH (ref 11.5–15.5)
WBC: 8.6 10*3/uL (ref 4.0–10.5)
nRBC: 0 % (ref 0.0–0.2)

## 2019-04-30 LAB — BASIC METABOLIC PANEL
Anion gap: 11 (ref 5–15)
BUN: 12 mg/dL (ref 6–20)
CO2: 21 mmol/L — ABNORMAL LOW (ref 22–32)
Calcium: 8.4 mg/dL — ABNORMAL LOW (ref 8.9–10.3)
Chloride: 100 mmol/L (ref 98–111)
Creatinine, Ser: 0.58 mg/dL (ref 0.44–1.00)
GFR calc Af Amer: >60 mL/min (ref >60)
GFR calc non Af Amer: >60 mL/min (ref >60)
Glucose, Bld: 102 mg/dL — ABNORMAL HIGH (ref 70–99)
Potassium: 3.9 mmol/L (ref 3.5–5.1)
Sodium: 132 mmol/L — ABNORMAL LOW (ref 135–145)

## 2019-04-30 LAB — SURGICAL PATHOLOGY

## 2019-04-30 MED ORDER — ADULT MULTIVITAMIN W/MINERALS CH
1.0000 | ORAL_TABLET | Freq: Every day | ORAL | Status: DC
  Administered 2019-04-30 – 2019-05-01 (×2): 1 via ORAL
  Filled 2019-04-30 (×2): qty 1

## 2019-04-30 MED ORDER — GLUCERNA SHAKE PO LIQD: 237.0000 mL | Freq: Three times a day (TID) | ORAL | Status: DC

## 2019-04-30 MED ORDER — ENSURE ENLIVE PO LIQD
237.0000 mL | Freq: Two times a day (BID) | ORAL | Status: DC
  Administered 2019-04-30 – 2019-06-09 (×50): 237 mL via ORAL

## 2019-04-30 MED ORDER — FUROSEMIDE 10 MG/ML IJ SOLN
40.0000 mg | Freq: Two times a day (BID) | INTRAMUSCULAR | Status: AC
  Administered 2019-04-30 (×2): 40 mg via INTRAVENOUS
  Filled 2019-04-30 (×2): qty 4

## 2019-04-30 NOTE — Progress Notes (Signed)
EVENING ROUNDS NOTE :     [address]       [place] [address]             [phone]                 2 Days Post-Op Procedure(s) (LRB): AORTIC VALVE REPLACEMENT (AVR) using Edwards PERIMOUNT Magna Ease Pericardial Bioprosthesis - 29 MM Aortic Valve. (N/A) REPAIR OF TRICUSPID (N/A) TRANSESOPHAGEAL ECHOCARDIOGRAM (TEE) (N/A)   Total Length of Stay:  LOS: 29 days  Events:  No events Up to chair Minimal chest tube output    BP (!) 116/91 (BP Location: Right Arm)   Pulse 96   Temp 97.6 F (36.4 C) (Oral)   Resp (!) 25   Ht 5\' 5"  (1.651 m)   Wt 77.3 kg   LMP 04/05/2019 Comment: Neg Preg Test on 04/03/19  SpO2 100%   BMI 28.36 kg/m         . sodium chloride Stopped (04/29/19 0832)  . sodium chloride    . sodium chloride 10 mL/hr at 04/29/19 0000  . cefTRIAXone (ROCEPHIN)  IV Stopped (04/30/19 1032)  . dexmedetomidine (PRECEDEX) IV infusion Stopped (04/29/19 0539)  . DOPamine Stopped (04/29/19 1646)  . lactated ringers    . lactated ringers Stopped (04/29/19 0230)  . lactated ringers 20 mL/hr at 04/30/19 1400  . norepinephrine (LEVOPHED) Adult infusion Stopped (04/29/19 0543)  . phenylephrine (NEO-SYNEPHRINE) Adult infusion    . vancomycin Stopped (04/30/19 1219)    I/O last 3 completed shifts: In: 2564.8 [P.O.:480; I.V.:1161; IV Piggyback:923.8] Out: 2985 [Urine:2675; Chest Tube:310]   CBC Latest Ref Rng & Units 04/30/2019 04/29/2019 04/29/2019  WBC 4.0 - 10.5 K/uL 8.6 10.7(H) 8.7  Hemoglobin 12.0 - 15.0 g/dL 8.1(L) 8.8(L) 8.7(L)  Hematocrit 36.0 - 46.0 % 26.0(L) 28.5(L) 28.2(L)  Platelets 150 - 400 K/uL 154 172 181    BMP Latest Ref Rng & Units 04/30/2019 04/29/2019 04/29/2019  Glucose 70 - 99 mg/dL 05/01/2019) 740(C) 144(Y)  BUN 6 - 20 mg/dL 12 10 11   Creatinine 0.44 - 1.00 mg/dL 185(U 3.14  Sodium 135 - 145 mmol/L 132(L) 133(L) 137  Potassium 3.5 - 5.1 mmol/L 3.9 4.3 4.3  Chloride 98 - 111 mmol/L 100 102 108  CO2 22 - 32 mmol/L 21(L) 22  20(L)  Calcium 8.9 - 10.3 mg/dL 9.70) 2.63) 7.8(H)    ABG    Component Value Date/Time   PHART 7.365 04/28/2019 2205   PCO2ART 42.6 04/28/2019 2205   PO2ART 91.0 04/28/2019 2205   HCO3 24.5 04/28/2019 2205   TCO2 26 04/28/2019 2205   ACIDBASEDEF 1.0 04/28/2019 2205   O2SAT 97.0 04/28/2019 2205       04/30/2019, MD 04/30/2019 4:19 PM

## 2019-04-30 NOTE — Progress Notes (Signed)
Nutrition Follow-up  DOCUMENTATION CODES:   Not applicable  INTERVENTION:   Ensure Enlive po BID, each supplement provides 350 kcal and 20 grams of protein, each supplement provides 220 kcal and 10 grams of protein  Add MVI with Minerals   NUTRITION DIAGNOSIS:   Increased nutrient needs related to acute illness as evidenced by estimated needs.  Being addressed via supplements  GOAL:   Patient will meet greater than or equal to 90% of their needs  Progressing  MONITOR:   I & O's, Labs, Supplement acceptance, PO intake, Weight trends  REASON FOR ASSESSMENT:   Ventilator    ASSESSMENT:   Pt with suspected IV drug abuse who while being interviewed by police developed left-sided weakness admitted with L MCA s/p IR for revascularization and L ICA stent.  3/24 - OG tube placed, intubated 3/27 - OG removed, extubated 3/29 - Regular diet ordered 3/30 - s/p TEE 4/20 - endocaditis with AV replacement, tricuspid valve repair, TEE  Pt will require IV antibiotics for 6 weeks and will remain in hospital during this time  Recorded po intake 50-100%. Today, po intake decreased eating 10% of meals. Pt complaining of difficulty swallowing with RN placing SLP eval   Current weight 77 kg; admit weight 72 kg  Labs: sodium 132 (L) Meds: ss novolog, lasix    Diet Order:   Diet Order            Diet Carb Modified Fluid consistency: Thin; Room service appropriate? Yes  Diet effective now              EDUCATION NEEDS:   No education needs have been identified at this time  Skin:  Skin Assessment: Reviewed RN Assessment  Last BM:  4/19  Height:   Ht Readings from Last 1 Encounters:  04/01/19 5\' 5"  (1.651 m)    Weight:   Wt Readings from Last 1 Encounters:  04/30/19 77.3 kg    Ideal Body Weight:  56.8 kg  BMI:  Body mass index is 28.36 kg/m.  Estimated Nutritional Needs:   Kcal:  1900-2100  Protein:  100-115 grams  Fluid:  > 1.9 L/day   05/02/19  MS, RDN, LDN, CNSC RD Pager Number and Weekend/On-Call After Hours Pager Located in [place]

## 2019-04-30 NOTE — Progress Notes (Signed)
Physical Therapy Treatment Patient Details Name: Lauren Reyes MRN: [id number] DOB: 02-14-1990 Today's Date: 04/30/2019    History of Present Illness Pt is 29 year old female who was admitted with suspected IV drug abuse was being interviewed by police and developed right-sided facial weakness, aphasia, and R weakness.  She was brought by EMS to the emergency room work-up revealed occluded left MCA. Revascularization performed by IR. CT head and neck demonstrate multiple small cavitary abscesses in the upper lobes bilaterally. IVC filter placement for RLE DVT on 3/26. Pt on antibiotics for multivalvular endocarditis.  Underwent AVR and tricuspid valve repair on 04/28/19.    PT Comments    Patient progressing with independence with mobility and with distance with ambulation.  She still fatigues quickly, but VSS with activity except RR up to 40's on RA, but improved after working on incentive.  She will continue to benefit from skilled PT and from follow up CIR level rehab prior to d/c.   Follow Up Recommendations  Supervision/Assistance - 24 hour;CIR     Equipment Recommendations  Other (comment)(TBA)    Recommendations for Other Services       Precautions / Restrictions Precautions Precautions: Fall;Sternal Precaution Comments: chest tube    Mobility  Bed Mobility Overal bed mobility: Needs Assistance Bed Mobility: Rolling;Sidelying to Sit Rolling: Min assist   Supine to sit: Min assist     General bed mobility comments: cues for technique again and using heart pillow  Transfers     Transfers: Sit to/from Stand Sit to Stand: Min assist         General transfer comment: cues for hand placement on knees or over chest  Ambulation/Gait Ambulation/Gait assistance: Min assist Gait Distance (Feet): 270 Feet Assistive device: 4-wheeled walker(eva walker) Gait Pattern/deviations: Step-through pattern;Trunk flexed;Decreased stride length     General Gait Details:  cues for posture   Stairs             Wheelchair Mobility    Modified Rankin (Stroke Patients Only)       Balance Overall balance assessment: Needs assistance   Sitting balance-Leahy Scale: Good     Standing balance support: No upper extremity supported Standing balance-Leahy Scale: Poor Standing balance comment: initial standing without UE support, but legs braced against bed                            Cognition Arousal/Alertness: Awake/alert Behavior During Therapy: WFL for tasks assessed/performed Overall Cognitive Status: Within Functional Limits for tasks assessed                                 General Comments: just a little slow and focused on pain      Exercises      General Comments General comments (skin integrity, edema, etc.): RR 40's after ambulation, but better after practice with IS up to today      Pertinent Vitals/Pain Pain Assessment: Faces Faces Pain Scale: Hurts even more Pain Location: chest Pain Descriptors / Indicators: Grimacing;Operative site guarding Pain Intervention(s): Monitored during session;Repositioned    Home Living                      Prior Function            PT Goals (current goals can now be found in the care plan section) Progress towards PT goals: Progressing toward  goals    Frequency    Min 4X/week      PT Plan Current plan remains appropriate    Co-evaluation              AM-PAC PT "6 Clicks" Mobility   Outcome Measure  Help needed turning from your back to your side while in a flat bed without using bedrails?: A Little Help needed moving from lying on your back to sitting on the side of a flat bed without using bedrails?: A Little Help needed moving to and from a bed to a chair (including a wheelchair)?: A Little Help needed standing up from a chair using your arms (e.g., wheelchair or bedside chair)?: A Little Help needed to walk in hospital  room?: A Little Help needed climbing 3-5 steps with a railing? : A Lot 6 Click Score: 17    End of Session   Activity Tolerance: Patient tolerated treatment well Patient left: in chair;with call bell/phone within reach;with nursing/sitter in room   PT Visit Diagnosis: Other abnormalities of gait and mobility (R26.89);Pain Pain - part of body: (chest)     Time: 1245-8099 PT Time Calculation (min) (ACUTE ONLY): 23 min  Charges:  $Gait Training: 8-22 mins $Therapeutic Activity: 8-22 mins                     [name], [place] [facility] [phone] 04/30/2019    [name] 04/30/2019, 4:13 PM

## 2019-04-30 NOTE — Progress Notes (Signed)
[facility]   Reason for visit: Follow up on endocarditis  Interval History: s/p AV replacement.  Afebrile, WBC 8.6.  No acute events.  Vancomycin + ceftriaxone   Physical Exam: Constitutional:  Vitals:   04/30/19 0800 04/30/19 0900  BP: 118/89   Pulse: 98 (!) 116  Resp: (!) 21 (!) 22  Temp:    SpO2: 100% 100%   patient appears in NAD Respiratory: Normal respiratory effort; CTA B Cardiovascular: RRR GI: soft, nt, nd  Review of Systems: Constitutional: negative for fevers and chills Gastrointestinal: negative for nausea and diarrhea Integument/breast: negative for rash  Lab Results  Component Value Date   WBC 8.6 04/30/2019   HGB 8.1 (L) 04/30/2019   HCT 26.0 (L) 04/30/2019   MCV 88.1 04/30/2019   PLT 154 04/30/2019    Lab Results  Component Value Date   CREATININE 0.58 04/30/2019   BUN 12 04/30/2019   NA 132 (L) 04/30/2019   K 3.9 04/30/2019   CL 100 04/30/2019   CO2 21 (L) 04/30/2019    Lab Results  Component Value Date   ALT 15 04/27/2019   AST 23 04/27/2019   ALKPHOS 80 04/27/2019     Microbiology: Recent Results (from the past 240 hour(s))  Surgical PCR screen     Status: None   Collection Time: 04/24/19  6:56 PM   Specimen: Nasal Mucosa; Nasal Swab  Result Value Ref Range Status   MRSA, PCR NEGATIVE NEGATIVE Final   Staphylococcus aureus NEGATIVE NEGATIVE Final    Comment: (NOTE) The Xpert SA Assay (FDA approved for NASAL specimens in patients 59 years of age and older), is one component of a comprehensive surveillance program. It is not intended to diagnose infection nor to guide or monitor treatment. Performed at [facility], [place] [address], [place], [place] [address]   MRSA PCR Screening     Status: None   Collection Time: 04/28/19  4:13 AM   Specimen: Nasopharyngeal  Result Value Ref Range Status   MRSA by PCR NEGATIVE NEGATIVE Final    Comment:        The GeneXpert MRSA Assay (FDA approved for  NASAL specimens only), is one component of a comprehensive MRSA colonization surveillance program. It is not intended to diagnose MRSA infection nor to guide or monitor treatment for MRSA infections. Performed at [facility], [place] [address] [place], [place] [address]   Aerobic/Anaerobic Culture (surgical/deep wound)     Status: None (Preliminary result)   Collection Time: 04/28/19  9:06 AM   Specimen: PATH Cytology Misc. fluid; Body Fluid  Result Value Ref Range Status   Specimen Description WOUND PERICARDIAL FLUID  Final   Special Requests SPEC A SENT ON SWAB  Final   Gram Stain   Final    RARE WBC PRESENT, PREDOMINANTLY MONONUCLEAR NO ORGANISMS SEEN    Culture   Final    NO GROWTH < 24 HOURS Performed at [facility], [place] [address] [place], [place] [address]    Report Status PENDING  Incomplete  Aerobic/Anaerobic Culture (surgical/deep wound)     Status: None (Preliminary result)   Collection Time: 04/28/19 10:19 AM   Specimen: Aortic Valve Leaflets; Tissue  Result Value Ref Range Status   Specimen Description AORTIC VALVE TISSUE LEAFLETS  Final   Special Requests SPECIMEN B  Final   Gram Stain   Final    FEW WBC PRESENT,BOTH PMN AND MONONUCLEAR RARE GRAM POSITIVE  COCCI    Culture   Final    NO GROWTH < 24 HOURS Performed at [facility], [address], [place], [place] [address]    Report Status PENDING  Incomplete  Aerobic/Anaerobic Culture (surgical/deep wound)     Status: None (Preliminary result)   Collection Time: 04/28/19 11:54 AM   Specimen: Heart Valve Leaflet(s); Tissue  Result Value Ref Range Status   Specimen Description VALVE TRICUSPID  Final   Special Requests SPECIMEN C  Final   Gram Stain NO WBC SEEN NO ORGANISMS SEEN   Final   Culture   Final    NO GROWTH < 24 HOURS Performed at [facility], [address], [place], [place] [address]    Report Status PENDING  Incomplete    Impression/Plan:  1. AV endocarditis -  has been culture negative and on empiric treatment with vancomycin and ceftriaxone.  Tissue valve with GPC on the gram stain.  Watching culture to see if there is any growth.  Path also with bacteria on tissue. With positive findings, she will need to continue with IV antibiotics for 6 weeks post surgical to assure clearance.    2.  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  Staining pending at this time.  With the mainly bacterial findings on the valve, I don't think this is significant.  I will though send off a PCR in the serum to be sure it is not excessively elevated.   No indication for antiviral treatment at this time.   3.  Substance abuse - history of IVDU. Will need to remain in a supervised setting for antibiotics.

## 2019-04-30 NOTE — Progress Notes (Addendum)
[facility] DAILY ICU PROGRESS NOTE                   [address]            [place] [address]          [phone]   2 Days Post-Op Procedure(s) (LRB): AORTIC VALVE REPLACEMENT (AVR) using Edwards PERIMOUNT Magna Ease Pericardial Bioprosthesis - 29 MM Aortic Valve. (N/A) REPAIR OF TRICUSPID (N/A) TRANSESOPHAGEAL ECHOCARDIOGRAM (TEE) (N/A)  Total Length of Stay:  LOS: 29 days   Subjective: Tired this morning. Walked in the halls yesterday without an issue.   Objective: Vital signs in last 24 hours: Temp:  [96.6 F (35.9 C)-98 F (36.7 C)] 98 F (36.7 C) (04/22 0700) Pulse Rate:  [89-107] 98 (04/22 0800) Cardiac Rhythm: Normal sinus rhythm (04/22 0800) Resp:  [17-35] 21 (04/22 0800) BP: (95-135)/(72-112) 118/89 (04/22 0800) SpO2:  [75 %-100 %] 100 % (04/22 0800) Weight:  [77.3 kg] 77.3 kg (04/22 0413)  Filed Weights   04/01/19 2023 04/29/19 0600 04/30/19 0413  Weight: 72.3 kg 77.2 kg 77.3 kg    Weight change: 0.1 kg   Hemodynamic parameters for last 24 hours: PAP: (43)/(19) 43/19  Intake/Output from previous day: 04/21 0701 - 04/22 0700 In: 1403.2 [P.O.:480; I.V.:423.2; IV Piggyback:500] Out: 2205 [Urine:2025; Chest Tube:180]  Intake/Output this shift: Total I/O In: 73.5 [I.V.:73.5] Out: 0   Current Meds: Scheduled Meds: . acetaminophen  1,000 mg Oral Q6H   Or  . acetaminophen (TYLENOL) oral liquid 160 mg/5 mL  1,000 mg Per Tube Q6H  . aspirin EC  325 mg Oral Daily   Or  . aspirin  324 mg Per Tube Daily  . bisacodyl  10 mg Oral Daily   Or  . bisacodyl  10 mg Rectal Daily  . busPIRone  7.5 mg Oral BID  . Chlorhexidine Gluconate Cloth  6 each Topical Daily  . docusate sodium  200 mg Oral Daily  . enoxaparin (LOVENOX) injection  30 mg Subcutaneous QHS  . escitalopram  10 mg Oral Daily  . furosemide  40 mg Intravenous BID  . insulin aspart  0-24 Units Subcutaneous Q4H  . metoprolol tartrate  12.5 mg Oral BID   Or  . metoprolol tartrate   12.5 mg Per Tube BID  . pantoprazole  40 mg Oral Daily  . pneumococcal 23 valent vaccine  0.5 mL Intramuscular Tomorrow-1000  . QUEtiapine  25 mg Oral QHS  . sodium chloride flush  10-40 mL Intracatheter Q12H  . sodium chloride flush  3 mL Intravenous Q12H   Continuous Infusions: . sodium chloride Stopped (04/29/19 0832)  . sodium chloride    . sodium chloride 10 mL/hr at 04/29/19 0000  . cefTRIAXone (ROCEPHIN)  IV Stopped (04/29/19 2207)  . dexmedetomidine (PRECEDEX) IV infusion Stopped (04/29/19 0539)  . DOPamine Stopped (04/29/19 1646)  . lactated ringers    . lactated ringers Stopped (04/29/19 0230)  . lactated ringers 20 mL/hr at 04/30/19 0800  . norepinephrine (LEVOPHED) Adult infusion Stopped (04/29/19 0543)  . phenylephrine (NEO-SYNEPHRINE) Adult infusion    . vancomycin 750 mg (04/30/19 0353)   PRN Meds:.sodium chloride, lactated ringers, LORazepam, metoprolol tartrate, morphine injection, ondansetron (ZOFRAN) IV, oxyCODONE, sodium chloride flush, sodium chloride flush, traMADol  General appearance: alert, cooperative and no distress Heart: regular rate and rhythm, S1, S2 normal, no murmur, click, rub or gallop Lungs: clear to auscultation bilaterally Abdomen: soft, non-tender; bowel sounds normal; no masses,  no organomegaly  Extremities: extremities normal, atraumatic, no cyanosis or edema Wound: clean and dry covered with a sterile dressing  Lab Results: CBC: Recent Labs    04/29/19 1700 04/30/19 0400  WBC 10.7* 8.6  HGB 8.8* 8.1*  HCT 28.5* 26.0*  PLT 172 154   BMET:  Recent Labs    04/29/19 1700 04/30/19 0400  NA 133* 132*  K 4.3 3.9  CL 102 100  CO2 22 21*  GLUCOSE 114* 102*  BUN 10 12  CREATININE 0.56 0.58  CALCIUM 8.6* 8.4*    CMET: Lab Results  Component Value Date   WBC 8.6 04/30/2019   HGB 8.1 (L) 04/30/2019   HCT 26.0 (L) 04/30/2019   PLT 154 04/30/2019   GLUCOSE 102 (H) 04/30/2019   CHOL 163 04/02/2019   TRIG 258 (H) 04/02/2019    HDL <10 (L) 04/02/2019   LDLDIRECT 42.9 04/03/2019   LDLCALC NOT CALCULATED 04/02/2019   ALT 15 04/27/2019   AST 23 04/27/2019   NA 132 (L) 04/30/2019   K 3.9 04/30/2019   CL 100 04/30/2019   CREATININE 0.58 04/30/2019   BUN 12 04/30/2019   CO2 21 (L) 04/30/2019   TSH 0.879 04/02/2019   INR 1.4 (H) 04/28/2019   HGBA1C 5.9 (H) 04/02/2019      PT/INR:  Recent Labs    04/28/19 1500  LABPROT 17.2*  INR 1.4*   Radiology: [facility] Chest Port 1 View  Result Date: 04/30/2019 CLINICAL DATA:  Cardiac surgery.  Chest tube. EXAM: PORTABLE CHEST 1 VIEW COMPARISON:  04/29/2019. FINDINGS: Interim removal of Swan-Ganz catheter and mediastinal drainage catheters. Right IJ sheath in stable position. PICC line now noted with tip over SVC. Bilateral chest tubes in stable position. Prior cardiac valve replacement. Cardiomegaly. Low lung volumes with bibasilar atelectasis. Bilateral pulmonary infiltrates/edema, progressed from prior exam. No pneumothorax. IMPRESSION: 1. Interim removal Swan-Ganz catheter mediastinal drainage catheters. Right IJ sheath in stable position. PICC line now noted with tip over SVC bilateral chest tubes in stable position. 2. Prior cardiac valve replacement. Cardiomegaly. Bilateral pulmonary infiltrates/edema progressed from prior exam. 3.  Low lung volumes with bibasilar atelectasis. Electronically Signed   By: Marcello Moores  Register   On: 04/30/2019 07:39     Assessment/Plan: S/P Procedure(s) (LRB): AORTIC VALVE REPLACEMENT (AVR) using Edwards PERIMOUNT Magna Ease Pericardial Bioprosthesis - 29 MM Aortic Valve. (N/A) REPAIR OF TRICUSPID (N/A) TRANSESOPHAGEAL ECHOCARDIOGRAM (TEE) (N/A)  1. CV-ST in the low 100s, BP well controlled. Dopamine discontinued.  2. Pulm-tolerating room air with good oxygen saturation. CXR stable. Chest tube output is 50cc every few hours. Bilateral chest tubes placed for bilateral pleural effusions in the OR.  3. Renal-creatinine 0.58, electrolytes okay 4.  H and H 8.1/26.0, expected acute blood loss anemia 5. Endo-blood glucose well controlled.  6. ID- continue IV antibiotics. Appreciate Dr. Linus Salmons looking into pathology CMV diagnosis.   Plan: Discontinue central line. Use PICC for IV abx. Chest tube drainage significantly decreased-keep one more day. OOB to chair. Ambulate in the halls TID today if able. Encouraged use of incentive spirometer. Making slow and steady progress.     Grace Isaac 04/30/2019 8:57 AM  We will leave chest tubes today, with some increased bilateral effusions on chest x-ray Diuresis today Continue IV antibiotics Continue with mobilization I have seen and examined Lauren Reyes and agree with the above assessment  and plan.  Grace Isaac MD [facility] [phone] Office [phone] 04/30/2019 8:58 AM

## 2019-04-30 NOTE — Progress Notes (Signed)
Patient struggles to swallow pills and requires yogurt or applesauce in addition to water. RN placed a SLP swallow eval.

## 2019-05-01 ENCOUNTER — Inpatient Hospital Stay (HOSPITAL_COMMUNITY): Payer: Medicaid Other

## 2019-05-01 LAB — POCT I-STAT, CHEM 8
BUN: 15 mg/dL (ref 6–20)
BUN: 15 mg/dL (ref 6–20)
BUN: 14 mg/dL (ref 6–20)
BUN: 13 mg/dL (ref 6–20)
BUN: 14 mg/dL (ref 6–20)
BUN: 14 mg/dL (ref 6–20)
Calcium, Ion: 1.29 mmol/L (ref 1.15–1.40)
Calcium, Ion: 1.18 mmol/L (ref 1.15–1.40)
Calcium, Ion: 1.1 mmol/L — ABNORMAL LOW (ref 1.15–1.40)
Calcium, Ion: 1.1 mmol/L — ABNORMAL LOW (ref 1.15–1.40)
Calcium, Ion: 1.12 mmol/L — ABNORMAL LOW (ref 1.15–1.40)
Calcium, Ion: 1.52 mmol/L (ref 1.15–1.40)
Chloride: 104 mmol/L (ref 98–111)
Chloride: 106 mmol/L (ref 98–111)
Chloride: 103 mmol/L (ref 98–111)
Chloride: 104 mmol/L (ref 98–111)
Chloride: 105 mmol/L (ref 98–111)
Chloride: 107 mmol/L (ref 98–111)
Creatinine, Ser: 0.5 mg/dL (ref 0.44–1.00)
Creatinine, Ser: 0.5 mg/dL (ref 0.44–1.00)
Creatinine, Ser: 0.5 mg/dL (ref 0.44–1.00)
Creatinine, Ser: 0.4 mg/dL — ABNORMAL LOW (ref 0.44–1.00)
Creatinine, Ser: 0.4 mg/dL — ABNORMAL LOW (ref 0.44–1.00)
Creatinine, Ser: 0.5 mg/dL (ref 0.44–1.00)
Glucose, Bld: 89 mg/dL (ref 70–99)
Glucose, Bld: 97 mg/dL (ref 70–99)
Glucose, Bld: 130 mg/dL — ABNORMAL HIGH (ref 70–99)
Glucose, Bld: 143 mg/dL — ABNORMAL HIGH (ref 70–99)
Glucose, Bld: 136 mg/dL — ABNORMAL HIGH (ref 70–99)
Glucose, Bld: 129 mg/dL — ABNORMAL HIGH (ref 70–99)
HCT: 32 % — ABNORMAL LOW (ref 36.0–46.0)
HCT: 33 % — ABNORMAL LOW (ref 36.0–46.0)
HCT: 26 % — ABNORMAL LOW (ref 36.0–46.0)
HCT: 26 % — ABNORMAL LOW (ref 36.0–46.0)
HCT: 25 % — ABNORMAL LOW (ref 36.0–46.0)
HCT: 24 % — ABNORMAL LOW (ref 36.0–46.0)
Hemoglobin: 10.9 g/dL — ABNORMAL LOW (ref 12.0–15.0)
Hemoglobin: 11.2 g/dL — ABNORMAL LOW (ref 12.0–15.0)
Hemoglobin: 8.8 g/dL — ABNORMAL LOW (ref 12.0–15.0)
Hemoglobin: 8.8 g/dL — ABNORMAL LOW (ref 12.0–15.0)
Hemoglobin: 8.5 g/dL — ABNORMAL LOW (ref 12.0–15.0)
Hemoglobin: 8.2 g/dL — ABNORMAL LOW (ref 12.0–15.0)
Potassium: 3.3 mmol/L — ABNORMAL LOW (ref 3.5–5.1)
Potassium: 3.6 mmol/L (ref 3.5–5.1)
Potassium: 4.1 mmol/L (ref 3.5–5.1)
Potassium: 3.9 mmol/L (ref 3.5–5.1)
Potassium: 4.4 mmol/L (ref 3.5–5.1)
Potassium: 3.8 mmol/L (ref 3.5–5.1)
Sodium: 139 mmol/L (ref 135–145)
Sodium: 138 mmol/L (ref 135–145)
Sodium: 136 mmol/L (ref 135–145)
Sodium: 137 mmol/L (ref 135–145)
Sodium: 138 mmol/L (ref 135–145)
Sodium: 139 mmol/L (ref 135–145)
TCO2: 23 mmol/L (ref 22–32)
TCO2: 22 mmol/L (ref 22–32)
TCO2: 24 mmol/L (ref 22–32)
TCO2: 25 mmol/L (ref 22–32)
TCO2: 26 mmol/L (ref 22–32)
TCO2: 25 mmol/L (ref 22–32)

## 2019-05-01 LAB — COMPREHENSIVE METABOLIC PANEL
ALT: 20 U/L (ref 0–44)
AST: 23 U/L (ref 15–41)
Albumin: 2.3 g/dL — ABNORMAL LOW (ref 3.5–5.0)
Alkaline Phosphatase: 82 U/L (ref 38–126)
Anion gap: 7 (ref 5–15)
BUN: 9 mg/dL (ref 6–20)
CO2: 27 mmol/L (ref 22–32)
Calcium: 8.2 mg/dL — ABNORMAL LOW (ref 8.9–10.3)
Chloride: 102 mmol/L (ref 98–111)
Creatinine, Ser: 0.63 mg/dL (ref 0.44–1.00)
GFR calc Af Amer: >60 mL/min (ref >60)
GFR calc non Af Amer: >60 mL/min (ref >60)
Glucose, Bld: 110 mg/dL — ABNORMAL HIGH (ref 70–99)
Potassium: 3 mmol/L — ABNORMAL LOW (ref 3.5–5.1)
Sodium: 136 mmol/L (ref 135–145)
Total Bilirubin: 0.3 mg/dL (ref 0.3–1.2)
Total Protein: 5.4 g/dL — ABNORMAL LOW (ref 6.5–8.1)

## 2019-05-01 LAB — TYPE AND SCREEN
ABO/RH(D): O POS
Antibody Screen: NEGATIVE
Unit division: 0
Unit division: 0
Unit division: 0
Unit division: 0

## 2019-05-01 LAB — GLUCOSE, CAPILLARY
Glucose-Capillary: 93 mg/dL (ref 70–99)
Glucose-Capillary: 99 mg/dL (ref 70–99)
Glucose-Capillary: 102 mg/dL — ABNORMAL HIGH (ref 70–99)
Glucose-Capillary: 89 mg/dL (ref 70–99)
Glucose-Capillary: 111 mg/dL — ABNORMAL HIGH (ref 70–99)
Glucose-Capillary: 105 mg/dL — ABNORMAL HIGH (ref 70–99)

## 2019-05-01 LAB — POCT I-STAT 7, (LYTES, BLD GAS, ICA,H+H)
Acid-base deficit: 5 mmol/L — ABNORMAL HIGH (ref 0.0–2.0)
Acid-base deficit: 2 mmol/L (ref 0.0–2.0)
Acid-base deficit: 3 mmol/L — ABNORMAL HIGH (ref 0.0–2.0)
Acid-base deficit: 2 mmol/L (ref 0.0–2.0)
Acid-base deficit: 1 mmol/L (ref 0.0–2.0)
Bicarbonate: 20.7 mmol/L (ref 20.0–28.0)
Bicarbonate: 22.9 mmol/L (ref 20.0–28.0)
Bicarbonate: 22.7 mmol/L (ref 20.0–28.0)
Bicarbonate: 22.8 mmol/L (ref 20.0–28.0)
Bicarbonate: 23.8 mmol/L (ref 20.0–28.0)
Calcium, Ion: 1.28 mmol/L (ref 1.15–1.40)
Calcium, Ion: 0.97 mmol/L — ABNORMAL LOW (ref 1.15–1.40)
Calcium, Ion: 1.08 mmol/L — ABNORMAL LOW (ref 1.15–1.40)
Calcium, Ion: 1.1 mmol/L — ABNORMAL LOW (ref 1.15–1.40)
Calcium, Ion: 1.5 mmol/L — ABNORMAL HIGH (ref 1.15–1.40)
HCT: 33 % — ABNORMAL LOW (ref 36.0–46.0)
HCT: 28 % — ABNORMAL LOW (ref 36.0–46.0)
HCT: 26 % — ABNORMAL LOW (ref 36.0–46.0)
HCT: 24 % — ABNORMAL LOW (ref 36.0–46.0)
HCT: 26 % — ABNORMAL LOW (ref 36.0–46.0)
Hemoglobin: 11.2 g/dL — ABNORMAL LOW (ref 12.0–15.0)
Hemoglobin: 9.5 g/dL — ABNORMAL LOW (ref 12.0–15.0)
Hemoglobin: 8.8 g/dL — ABNORMAL LOW (ref 12.0–15.0)
Hemoglobin: 8.2 g/dL — ABNORMAL LOW (ref 12.0–15.0)
Hemoglobin: 8.8 g/dL — ABNORMAL LOW (ref 12.0–15.0)
O2 Saturation: 100 %
O2 Saturation: 100 %
O2 Saturation: 100 %
O2 Saturation: 100 %
O2 Saturation: 100 %
Potassium: 3.4 mmol/L — ABNORMAL LOW (ref 3.5–5.1)
Potassium: 3.8 mmol/L (ref 3.5–5.1)
Potassium: 4.9 mmol/L (ref 3.5–5.1)
Potassium: 4.3 mmol/L (ref 3.5–5.1)
Potassium: 3.8 mmol/L (ref 3.5–5.1)
Sodium: 139 mmol/L (ref 135–145)
Sodium: 138 mmol/L (ref 135–145)
Sodium: 136 mmol/L (ref 135–145)
Sodium: 138 mmol/L (ref 135–145)
Sodium: 140 mmol/L (ref 135–145)
TCO2: 22 mmol/L (ref 22–32)
TCO2: 24 mmol/L (ref 22–32)
TCO2: 24 mmol/L (ref 22–32)
TCO2: 24 mmol/L (ref 22–32)
TCO2: 25 mmol/L (ref 22–32)
pCO2 arterial: 40.1 mmHg (ref 32.0–48.0)
pCO2 arterial: 38.1 mmHg (ref 32.0–48.0)
pCO2 arterial: 42.3 mmHg (ref 32.0–48.0)
pCO2 arterial: 38.6 mmHg (ref 32.0–48.0)
pCO2 arterial: 39.9 mmHg (ref 32.0–48.0)
pH, Arterial: 7.322 — ABNORMAL LOW (ref 7.350–7.450)
pH, Arterial: 7.386 (ref 7.350–7.450)
pH, Arterial: 7.337 — ABNORMAL LOW (ref 7.350–7.450)
pH, Arterial: 7.381 (ref 7.350–7.450)
pH, Arterial: 7.383 (ref 7.350–7.450)
pO2, Arterial: 219 mmHg — ABNORMAL HIGH (ref 83.0–108.0)
pO2, Arterial: 401 mmHg — ABNORMAL HIGH (ref 83.0–108.0)
pO2, Arterial: 397 mmHg — ABNORMAL HIGH (ref 83.0–108.0)
pO2, Arterial: 378 mmHg — ABNORMAL HIGH (ref 83.0–108.0)
pO2, Arterial: 367 mmHg — ABNORMAL HIGH (ref 83.0–108.0)

## 2019-05-01 LAB — BPAM RBC
Blood Product Expiration Date: 202105212359
Blood Product Expiration Date: 202105212359
Blood Product Expiration Date: 202105212359
Blood Product Expiration Date: 202105212359
ISSUE DATE / TIME: 202104200843
ISSUE DATE / TIME: 202104200843
Unit Type and Rh: 5100
Unit Type and Rh: 5100
Unit Type and Rh: 5100
Unit Type and Rh: 5100

## 2019-05-01 LAB — CMV DNA, QUANTITATIVE, PCR
CMV DNA Quant: 386 IU/mL
Log10 CMV Qn DNA Pl: 2.587 log10 IU/mL

## 2019-05-01 LAB — CBC
HCT: 25.1 % — ABNORMAL LOW (ref 36.0–46.0)
Hemoglobin: 7.9 g/dL — ABNORMAL LOW (ref 12.0–15.0)
MCH: 27.4 pg (ref 26.0–34.0)
MCHC: 31.5 g/dL (ref 30.0–36.0)
MCV: 87.2 fL (ref 80.0–100.0)
Platelets: 202 10*3/uL (ref 150–400)
RBC: 2.88 MIL/uL — ABNORMAL LOW (ref 3.87–5.11)
RDW: 18.2 % — ABNORMAL HIGH (ref 11.5–15.5)
WBC: 7.6 10*3/uL (ref 4.0–10.5)
nRBC: 0 % (ref 0.0–0.2)

## 2019-05-01 MED ORDER — INSULIN ASPART 100 UNIT/ML ~~LOC~~ SOLN: 0.0000 [IU] | Freq: Three times a day (TID) | SUBCUTANEOUS | Status: DC

## 2019-05-01 MED ORDER — FUROSEMIDE 10 MG/ML IJ SOLN
40.0000 mg | Freq: Once | INTRAMUSCULAR | Status: AC
  Administered 2019-05-01: 40 mg via INTRAVENOUS
  Filled 2019-05-01: qty 4

## 2019-05-01 MED ORDER — POTASSIUM CHLORIDE 20 MEQ/15ML (10%) PO SOLN
20.0000 meq | ORAL | Status: AC
  Administered 2019-05-01 (×3): 20 meq
  Filled 2019-05-01 (×3): qty 15

## 2019-05-01 MED ORDER — ~~LOC~~ CARDIAC SURGERY, PATIENT & FAMILY EDUCATION: Freq: Once | Status: DC

## 2019-05-01 MED ORDER — CLOPIDOGREL BISULFATE 75 MG PO TABS
75.0000 mg | ORAL_TABLET | Freq: Every day | ORAL | Status: DC
  Administered 2019-05-02 – 2019-06-09 (×39): 75 mg via ORAL
  Filled 2019-05-01 (×39): qty 1

## 2019-05-01 MED ORDER — SODIUM CHLORIDE 0.9 % IV SOLN: 250.0000 mL | INTRAVENOUS | Status: DC | PRN

## 2019-05-01 MED ORDER — SODIUM CHLORIDE 0.9% FLUSH: 3.0000 mL | INTRAVENOUS | Status: DC | PRN

## 2019-05-01 MED ORDER — FUROSEMIDE 10 MG/ML IJ SOLN
40.0000 mg | Freq: Once | INTRAMUSCULAR | Status: AC
  Administered 2019-05-01: 18:00:00 40 mg via INTRAVENOUS
  Filled 2019-05-01 (×2): qty 4

## 2019-05-01 MED ORDER — ASPIRIN EC 81 MG PO TBEC
81.0000 mg | DELAYED_RELEASE_TABLET | Freq: Every day | ORAL | Status: DC
  Administered 2019-05-03 – 2019-06-09 (×37): 81 mg via ORAL
  Filled 2019-05-01 (×38): qty 1

## 2019-05-01 MED ORDER — SODIUM CHLORIDE 0.9% FLUSH
3.0000 mL | Freq: Two times a day (BID) | INTRAVENOUS | Status: DC
  Administered 2019-05-01 – 2019-06-09 (×43): 3 mL via INTRAVENOUS

## 2019-05-01 MED ORDER — ASPIRIN 81 MG PO CHEW
81.0000 mg | CHEWABLE_TABLET | Freq: Every day | ORAL | Status: DC
  Administered 2019-05-02 – 2019-05-13 (×2): 81 mg
  Filled 2019-05-01 (×3): qty 1

## 2019-05-01 NOTE — Progress Notes (Signed)
[facility]   Reason for visit: Follow up on endocarditis  Interval History: s/p AV replacement.  Remains afebrile, chest tubes remain in, WBC 7.6.    Vancomycin + ceftriaxone   Physical Exam: Constitutional:  Vitals:   05/01/19 1010 05/01/19 1100  BP: (!) 126/101 96/75  Pulse: (!) 108 87  Resp: (!) 27 19  Temp:  97.6 F (36.4 C)  SpO2: 98% 97%   patient appears in NAD Respiratory: Normal respiratory effort; CTA B + chest tube Cardiovascular: RRR GI: soft, nt, nd  Review of Systems: Constitutional: negative for fevers and chills Gastrointestinal: negative for nausea and diarrhea Integument/breast: negative for rash  Lab Results  Component Value Date   WBC 7.6 05/01/2019   HGB 7.9 (L) 05/01/2019   HCT 25.1 (L) 05/01/2019   MCV 87.2 05/01/2019   PLT 202 05/01/2019    Lab Results  Component Value Date   CREATININE 0.63 05/01/2019   BUN 9 05/01/2019   NA 136 05/01/2019   K 3.0 (L) 05/01/2019   CL 102 05/01/2019   CO2 27 05/01/2019    Lab Results  Component Value Date   ALT 20 05/01/2019   AST 23 05/01/2019   ALKPHOS 82 05/01/2019     Microbiology: Recent Results (from the past 240 hour(s))  Surgical PCR screen     Status: None   Collection Time: 04/24/19  6:56 PM   Specimen: Nasal Mucosa; Nasal Swab  Result Value Ref Range Status   MRSA, PCR NEGATIVE NEGATIVE Final   Staphylococcus aureus NEGATIVE NEGATIVE Final    Comment: (NOTE) The Xpert SA Assay (FDA approved for NASAL specimens in patients 45 years of age and older), is one component of a comprehensive surveillance program. It is not intended to diagnose infection nor to guide or monitor treatment. Performed at [facility], [address], [place], [place] [address]   MRSA PCR Screening     Status: None   Collection Time: 04/28/19  4:13 AM   Specimen: Nasopharyngeal  Result Value Ref Range Status   MRSA by PCR NEGATIVE NEGATIVE Final    Comment:          The GeneXpert MRSA Assay (FDA approved for NASAL specimens only), is one component of a comprehensive MRSA colonization surveillance program. It is not intended to diagnose MRSA infection nor to guide or monitor treatment for MRSA infections. Performed at [facility], [address] [place], [place] [address]   Aerobic/Anaerobic Culture (surgical/deep wound)     Status: None (Preliminary result)   Collection Time: 04/28/19  9:06 AM   Specimen: PATH Cytology Misc. fluid; Body Fluid  Result Value Ref Range Status   Specimen Description WOUND PERICARDIAL FLUID  Final   Special Requests SPEC A SENT ON SWAB  Final   Gram Stain   Final    RARE WBC PRESENT, PREDOMINANTLY MONONUCLEAR NO ORGANISMS SEEN    Culture   Final    NO GROWTH 3 DAYS NO ANAEROBES ISOLATED; CULTURE IN PROGRESS FOR 5 DAYS Performed at [facility], [address], [place], [place] [address]    Report Status PENDING  Incomplete  Aerobic/Anaerobic Culture (surgical/deep wound)     Status: None (Preliminary result)   Collection Time: 04/28/19 10:19 AM   Specimen: Aortic Valve Leaflets; Tissue  Result Value Ref Range Status   Specimen Description AORTIC VALVE TISSUE LEAFLETS  Final   Special Requests SPECIMEN B  Final  Gram Stain   Final    FEW WBC PRESENT,BOTH PMN AND MONONUCLEAR RARE GRAM POSITIVE COCCI    Culture   Final    NO GROWTH 2 DAYS NO GROWTH 3 DAYS Performed at [facility], [address] [place], [place] [address]    Report Status PENDING  Incomplete  Aerobic/Anaerobic Culture (surgical/deep wound)     Status: None (Preliminary result)   Collection Time: 04/28/19 11:54 AM   Specimen: Heart Valve Leaflet(s); Tissue  Result Value Ref Range Status   Specimen Description VALVE TRICUSPID  Final   Special Requests SPECIMEN C  Final   Gram Stain NO WBC SEEN NO ORGANISMS SEEN   Final   Culture   Final    NO GROWTH 3 DAYS NO ANAEROBES ISOLATED; CULTURE IN PROGRESS FOR 5  DAYS Performed at [facility], [address], [place], [place] [address]    Report Status PENDING  Incomplete    Impression/Plan:  1. AV endocarditis - tissue culture with no growth to date.  Remains on vancomycin and ceftriaxone.  GPC on gram stain.  CNS emboli.  Will need 6 weeks of treatment post surgery through May 31st.    2.  CMV - path report noted and addendum.  Staining looks like CMV in some areas.  I suspect his is not significant but will see if the PCR DNA is relatively high to suggest more active infection.    3.  Substance abuse - history of IVDU. Will need to remain in a supervised setting for antibiotics.    Dr. Baxter Flattery on over the weekend and will monitor the CMV result.

## 2019-05-01 NOTE — Progress Notes (Signed)
30 minutes after PT removal, RN called to bedside by pt with c/o chest dressing saturation. Upon assessment, copious serous drainage noted from left PT removal site. Suture tightly secure and intact. No SOB reported from pt, bilateral breath sounds confirmed.   At this same time, a portion of an epicardial pacing wire was found in the patient's bed. After the drsg was taken down, it was noted that one of the pt's ventricular lead pacing wires had become fractured. About 3" of wire still patent on the outside of chest. Wires all secured and re-taped.  Jari Favre, PA, paged to bedside to assess site. New drsg applied by PA, no other interventions discussed or ordered. PA also made aware of fractured epicardial pacing wire.

## 2019-05-01 NOTE — Plan of Care (Signed)
Discussed with pharmacy about pt antiplatelet regimen. Pt has been off brilinta but on ASA only for sometime due to the cardiovascular surgery. Currently stabilized post surgery, given her intracranial stenting, neurology was contacted for antiplatelet regimen. Agree with DAPT with ASA and plavix if no contraindications, usually we do 3-6 months after stenting. But pt will follow up with Dr. Corliss Skains as outpt and decided on exact regimen. She will also follow up with neurology stroke clinic Dr. Pearlean Brownie.   Lauren Plan, MD PhD Stroke Neurology 05/01/2019 10:26 AM

## 2019-05-01 NOTE — Evaluation (Signed)
Clinical/Bedside Swallow Evaluation Patient Details  Name: Lauren Reyes MRN: [id number] Date of Birth: 1990/12/11  Today's Date: 05/01/2019 Time: SLP Start Time (ACUTE ONLY): 1000 SLP Stop Time (ACUTE ONLY): 1015 SLP Time Calculation (min) (ACUTE ONLY): 15 min  Past Medical History:  Past Medical History:  Diagnosis Date  . IV drug user   . Polysubstance abuse ([place])   . Tobacco use    Past Surgical History:  Past Surgical History:  Procedure Laterality Date  . AORTIC VALVE REPLACEMENT N/A 04/28/2019   Procedure: AORTIC VALVE REPLACEMENT (AVR) using [facility] Ease Pericardial Bioprosthesis - 29 MM Aortic Valve.;  Surgeon: Grace Isaac, MD;  Location: [place];  Service: Open Heart Surgery;  Laterality: N/A;  . BUBBLE STUDY  04/07/2019   Procedure: BUBBLE STUDY;  Surgeon: Buford Dresser, MD;  Location: [place];  Service: Cardiovascular;;  . IR CT HEAD LTD  04/01/2019  . IR INTRAVSC STENT CERV CAROTID W/O EMB-PROT MOD SED INC ANGIO  04/01/2019  . IR IVC FILTER PLMT / S&I /IMG GUID/MOD SED  04/03/2019  . IR PERCUTANEOUS ART THROMBECTOMY/INFUSION INTRACRANIAL INC DIAG ANGIO  04/01/2019  . RADIOLOGY WITH ANESTHESIA N/A 04/01/2019   Procedure: IR WITH ANESTHESIA;  Surgeon: Luanne Bras, MD;  Location: [place];  Service: Radiology;  Laterality: N/A;  . TEE WITHOUT CARDIOVERSION N/A 04/07/2019   Procedure: TRANSESOPHAGEAL ECHOCARDIOGRAM (TEE);  Surgeon: Buford Dresser, MD;  Location: [facility] ENDOSCOPY;  Service: Cardiovascular;  Laterality: N/A;  . TEE WITHOUT CARDIOVERSION N/A 04/28/2019   Procedure: TRANSESOPHAGEAL ECHOCARDIOGRAM (TEE);  Surgeon: Grace Isaac, MD;  Location: [place];  Service: Open Heart Surgery;  Laterality: N/A;  . TRICUSPID VALVE REPLACEMENT N/A 04/28/2019   Procedure: REPAIR OF TRICUSPID;  Surgeon: Grace Isaac, MD;  Location: [place];  Service: Open Heart Surgery;  Laterality: N/A;   HPI:  Pt is a 29 yo female with  suspected IVDU admitted with sudden onset R sided weakness and aphasia, now s/p revascularization by IR of L MCA. MRI showed numerous smaller acute infarcts scattered throughout the brain bilaterally. ETT 3/24-3/27; 4/20 for procedure. CXR 4/22: Bilateral pulmonary infiltrates/edema progressed from 4/21.   Assessment / Plan / Recommendation Clinical Impression  Pt was seen for bedside swallow evaluation and she denied a history of dysphagia prior to yesterday when she "just couldn't get pills down". Pt reported that she has since been taking them in apple sauce without difficulty. The dysarthria has improved and speech was fully intelligible with mild articulatory imprecision. Per pt, her speech is almost back to baseline. Oral mechanism exam was significant for mild facial and lingual weakness but was within functional limits. Dentition was adequate and natural. She tolerated all solids and liquids without signs or symptoms of oropharyngeal dysphagia. Further skilled SLP services are not clinically indicated at this time for swallowing.  SLP Visit Diagnosis: Dysphagia, unspecified (R13.10)    Aspiration Risk  No limitations    Diet Recommendation Regular;Thin liquid   Liquid Administration via: Cup;Straw Medication Administration: Whole meds with puree Supervision: Patient able to self feed    Other  Recommendations Oral Care Recommendations: Oral care BID;Patient independent with oral care   Follow up Recommendations None      Frequency and Duration            Prognosis        Swallow Study   General Date of Onset: 04/30/19 HPI: Pt is a 29 yo female with suspected IVDU admitted with sudden onset R  sided weakness and aphasia, now s/p revascularization by IR of L MCA. MRI showed numerous smaller acute infarcts scattered throughout the brain bilaterally. ETT 3/24-3/27; 4/20 for procedure. CXR 4/22: Bilateral pulmonary infiltrates/edema progressed from 4/21. Type of Study: Bedside  Swallow Evaluation Previous Swallow Assessment: MBS on 3/29 Diet Prior to this Study: Regular;Thin liquids Temperature Spikes Noted: No Respiratory Status: Room air History of Recent Intubation: Yes Length of Intubations (days): 2 days Date extubated: 04/04/19(ETT for surgery on 4/20) Behavior/Cognition: Alert;Cooperative Oral Cavity Assessment: Within Functional Limits Oral Care Completed by SLP: No Oral Cavity - Dentition: Adequate natural dentition Vision: Functional for self-feeding Self-Feeding Abilities: Able to feed self Patient Positioning: Upright in bed Baseline Vocal Quality: Normal Volitional Cough: Strong Volitional Swallow: Able to elicit    Oral/Motor/Sensory Function Overall Oral Motor/Sensory Function: Mild impairment Facial ROM: Within Functional Limits Facial Symmetry: Within Functional Limits Facial Strength: Within Functional Limits Facial Sensation: Within Functional Limits Lingual ROM: Reduced right;Reduced left Lingual Symmetry: Within Functional Limits Lingual Strength: Reduced Lingual Sensation: Within Functional Limits Velum: Within Functional Limits Mandible: Within Functional Limits   Ice Chips Ice chips: Within functional limits Presentation: Spoon   Thin Liquid Thin Liquid: Within functional limits Presentation: Straw;Cup    Nectar Thick Nectar Thick Liquid: Not tested   Honey Thick Honey Thick Liquid: Not tested   Puree Puree: Within functional limits Presentation: Spoon   Solid     Solid: Within functional limits Presentation: Self Fed     [name] I. Vear Clock, MS, CCC-SLP Acute Rehabilitation Services Office number [phone] Pager [phone]  Scheryl Marten 05/01/2019,12:10 PM

## 2019-05-01 NOTE — Progress Notes (Addendum)
      [address]       [place] [address]             [phone]      3 Days Post-Op Procedure(s) (LRB): AORTIC VALVE REPLACEMENT (AVR) using [employer] PERIMOUNT [employer] Ease Pericardial Bioprosthesis - 29 MM Aortic Valve. (N/A) REPAIR OF TRICUSPID (N/A) TRANSESOPHAGEAL ECHOCARDIOGRAM (TEE) (N/A) Subjective: Feels okay this morning. Eating more and appetite coming back.   Objective: Vital signs in last 24 hours: Temp:  [96.4 F (35.8 C)-98.5 F (36.9 C)] 98.1 F (36.7 C) (04/23 0700) Pulse Rate:  [92-116] 100 (04/23 0700) Cardiac Rhythm: Sinus tachycardia (04/23 0400) Resp:  [17-31] 22 (04/23 0700) BP: (103-125)/(70-94) 105/84 (04/23 0700) SpO2:  [89 %-100 %] 98 % (04/23 0700) Weight:  [70.8 kg] 70.8 kg (04/23 0500)     Intake/Output from previous day: 04/22 0701 - 04/23 0700 In: 1574.3 [P.O.:300; I.V.:324.4; IV Piggyback:949.9] Out: 1505 [Urine:1450; Chest Tube:55] Intake/Output this shift: No intake/output data recorded.  General appearance: alert, cooperative and no distress Heart: sinus tachycardia Lungs: clear to auscultation bilaterally Abdomen: soft, non-tender; bowel sounds normal; no masses,  no organomegaly Extremities: extremities normal, atraumatic, no cyanosis or edema Wound: clean and dry  Lab Results: Recent Labs    04/30/19 0400 05/01/19 0412  WBC 8.6 7.6  HGB 8.1* 7.9*  HCT 26.0* 25.1*  PLT 154 202   BMET:  Recent Labs    04/30/19 0400 05/01/19 0412  NA 132* 136  K 3.9 3.0*  CL 100 102  CO2 21* 27  GLUCOSE 102* 110*  BUN 12 9  CREATININE 0.58 0.63  CALCIUM 8.4* 8.2*    PT/INR:  Recent Labs    04/28/19 1500  LABPROT 17.2*  INR 1.4*   ABG    Component Value Date/Time   PHART 7.365 04/28/2019 2205   HCO3 24.5 04/28/2019 2205   TCO2 26 04/28/2019 2205   ACIDBASEDEF 1.0 04/28/2019 2205   O2SAT 97.0 04/28/2019 2205   CBG (last 3)  Recent Labs    05/01/19 0103 05/01/19 0408 05/01/19 0737  GLUCAP 93 99  102*    Assessment/Plan: S/P Procedure(s) (LRB): AORTIC VALVE REPLACEMENT (AVR) using [employer] PERIMOUNT Magna Ease Pericardial Bioprosthesis - 29 MM Aortic Valve. (N/A) REPAIR OF TRICUSPID (N/A) TRANSESOPHAGEAL ECHOCARDIOGRAM (TEE) (N/A)  1. CV-NSR to ST, BP well controlled.  2. Pulm-tolerating room air with good oxygen saturation. CXR stable.Chest tubes with minimal drainage-will discontinue 3. Renal-creatinine 0.63, electrolytes okay 4. H and H 7.9/25.1, expected acute blood loss anemia 5. Endo-blood glucose well controlled.  6. ID- continue IV antibiotics. Appreciate Dr. Luciana Axe looking into pathology CMV diagnosis. She will need 6 more weeks of IV antibiotics.   Plan: Discontinue chest tubes. Can transfer to [facility]. Lasix x 1 today. Continue antibiotics.     LOS: 30 days    [name] 05/01/2019  6 weeks of in patient iv antibiotics post AVR Has caval  filter in place  Replace kcl To stepdown 2-c today  I have seen and examined Lauren Reyes and agree with the above assessment  and plan.  Delight Ovens MD [facility] [phone] Office [phone] 05/01/2019 8:30 AM

## 2019-05-01 NOTE — Progress Notes (Signed)
Physical Therapy Treatment Patient Details Name: Lauren Reyes MRN: [id number] DOB: [birth year] Today's Date: 05/01/2019    History of Present Illness Pt is 29 year old female who was admitted with suspected IV drug abuse was being interviewed by police and developed right-sided facial weakness, aphasia, and R weakness.  She was brought by EMS to the emergency room work-up revealed occluded left MCA. Revascularization performed by IR. CT head and neck demonstrate multiple small cavitary abscesses in the upper lobes bilaterally. IVC filter placement for RLE DVT on 3/26. Pt on antibiotics for multivalvular endocarditis.  Underwent AVR and tricuspid valve repair on 04/28/19.    PT Comments    Pt with improving mobility. It appears pt will be here for quite some time for IV antibiotics. Expect that pt will be independent with mobility by the time pt will be ready for dc from medical standpoint and likely won't need any PT at DC.    Follow Up Recommendations  Supervision - Intermittent;Other (comment)(pt likely inpt until end of May due to IV antibiotics)     Equipment Recommendations  Other (comment)(TBA)    Recommendations for Other Services       Precautions / Restrictions Precautions Precautions: Fall;Sternal Precaution Comments: chest tube    Mobility  Bed Mobility Overal bed mobility: Needs Assistance Bed Mobility: Sit to Sidelying         Sit to sidelying: Min assist General bed mobility comments: Assist to bring feet up and cues for technique  Transfers Overall transfer level: Needs assistance Equipment used: 4-wheeled walker Transfers: Sit to/from [facility] Sit to Stand: Min guard Stand pivot transfers: Min guard       General transfer comment: pt holding pillow to chest for transfer  Ambulation/Gait Ambulation/Gait assistance: Min guard Gait Distance (Feet): 250 Feet Assistive device: 4-wheeled walker Gait Pattern/deviations:  Step-through pattern;Trunk flexed;Decreased stride length Gait velocity: decr Gait velocity interpretation: 1.31 - 2.62 ft/sec, indicative of limited community ambulator General Gait Details: Assist for Therapist, music    Modified Rankin (Stroke Patients Only)       Balance Overall balance assessment: Needs assistance Sitting-balance support: No upper extremity supported;Feet supported Sitting balance-Leahy Scale: Good     Standing balance support: No upper extremity supported;During functional activity Standing balance-Leahy Scale: Fair Standing balance comment: supervision for static standing                            Cognition Arousal/Alertness: Awake/alert Behavior During Therapy: WFL for tasks assessed/performed Overall Cognitive Status: Within Functional Limits for tasks assessed                                 General Comments: slow processing      Exercises      General Comments        Pertinent Vitals/Pain Pain Assessment: Faces Faces Pain Scale: Hurts even more Pain Location: chest Pain Descriptors / Indicators: Grimacing;Operative site guarding Pain Intervention(s): Monitored during session;Repositioned;Patient requesting pain meds-RN notified    Home Living                      Prior Function            PT Goals (current goals can now be found in the care plan section) Progress  towards PT goals: Progressing toward goals    Frequency    Min 3X/week      PT Plan Discharge plan needs to be updated;Frequency needs to be updated    Co-evaluation              AM-PAC PT "6 Clicks" Mobility   Outcome Measure  Help needed turning from your back to your side while in a flat bed without using bedrails?: A Little Help needed moving from lying on your back to sitting on the side of a flat bed without using bedrails?: A Little Help needed moving to and from  a bed to a chair (including a wheelchair)?: A Little Help needed standing up from a chair using your arms (e.g., wheelchair or bedside chair)?: A Little Help needed to walk in hospital room?: A Little Help needed climbing 3-5 steps with a railing? : A Lot 6 Click Score: 17    End of Session   Activity Tolerance: Patient tolerated treatment well Patient left: with call bell/phone within reach;in bed;with bed alarm set Nurse Communication: Mobility status;Patient requests pain meds PT Visit Diagnosis: Other abnormalities of gait and mobility (R26.89);Pain Pain - part of body: (chest)     Time: 0626-9485 PT Time Calculation (min) (ACUTE ONLY): 29 min  Charges:  $Gait Training: 23-37 mins                     [facility] PT Acute Rehabilitation Services Pager [phone] Office [phone]    Angelina Ok [facility] 05/01/2019, 1:53 PM

## 2019-05-01 NOTE — Progress Notes (Signed)
PROGRESS NOTE    Lauren Reyes  TSV:[id number] DOB: 1990/12/18 DOA: 04/01/2019 PCP: Patient, No Pcp Per   Brief Narrative: 29 year old female unknown past medical history, possible heroin abuse presented to ER with a code stroke, aphasia, right-sided weakness.  Patient was admitted to neurology ICU intubated after thrombectomy for left MCA M3 occlusion with recanalization complicated by dissection/pseudoaneurysm of left ICA status post stent, extubated March 27.  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 patient transferred to [name].  Subjective: chest tube being removed.  Pain currently controlled with medication regimen.  No nausea no vomiting.  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 Initially the patient was treated with Vancomycin and Unasyn.  Currently vancomycin/Rocephin as per infectious disease-last dose planned for 05/17/2019 to complete 6 weeks of therapy.  Afebrile, leukocytosis resolved. Seen by CT surgery Lauren Reyes: patient eventually will require aortic valve replacement, however this is complicated by recent stroke and recent stent on aspirin and Brilinta, as well as a recent decline in mental status.Per Dr. Nydia Bouton, surgery should be reconsidered should the patient developed worsening of heart failure syndrome and she has to be off Brilinta for 7 days.However, the patient was re-evaluated by CTS 4/18- and stopped her Brillinta and underwent surgery  Continue antibiotics per ID.  Appreciate assistance.  Dyspnea/tachypnea: Resolved.CT chest 4/18-mild progression of cavitary bronchopneumonia with nodules and septic pulmonary emboli .no PE. On lasix, IS.  Acute small CVA: she had a small infarct of left MCA territory.  TPA was deferred as concern for septic emboli.she is s/p leftcommon carotid arteriogram followed by complete revascularization of occluded M3 region of inferior division ofleft MCA complicated by dissection/pseudoaneurysm of left ICA-s/p placement of a 4.5 mm x 85mm pipeline flow diverter at site of focal dissection associated with a small pseudoaneurysm of distal cervical LT ICA. She has been continued on ASA and Brillinta. A1c is 5.9, VLDL was 52. She is on lipitor 80 mg daily.  Brilinta held preoperatively 4/18. Currently per neurology resuming dual antiplatelets with aspirin and Plavix for 3 to 6 months. Follow-up with Dr. Corliss Skains as outpatient. Follow-up with Dr. Pearlean Brownie as outpatient  Hypokalemia resolved.  Dyslipidemia:LDL at 42, cont Lipitor.  Sinus tachycardia/tachypnea: Multifactorial drug withdrawals deconditioning, acute illness. TSH 0.8. Has been started on as needed ativan. And on low-dose Lopressor.Cardiology has advised caution with any increase in dose of beta blocker due to the patient's severe AR.  Right common femoral DVT not a anticoagulation candidate due to large right groin hematoma postprocedure and endocarditis so she has undergone IVC filter placement on March 26 by IR  Large right groin hematoma postprocedure pseudoaneurysm addressed with status post 40F Angio-Seal for hemostasis.  Monitor CBC intermittently hemoglobin is stable  Acute blood loss anemia with right groin hematoma status post 2 unit PRBC on March 27.  Seen by GI March 27 do not suspect GI bleed.  Monitor hemoglobin intermittently.  B12 deficiency continue supplement Hyponatremia resolved. Hepatitis C antibody positive negative viral load HIV screen negative RPR negative.  Need outpatient follow-up.  Polysubstance abuse UDS positive for THC, amphetamine on admission.  Suspected IV drug use with heroin and multiple needle marks bilateral upper extremities, patient endorses IV heroin use ongoing for  many years and wanting to quit.  Patient to complete IV antibiotic supervised setting in the  hospital. She will need to have a plan in place for substance abuse rehab as outpatient before she goes to surgery.  Anxiety versus drug withdrawal positive for benzodiazepines on admission.  Had episode of crying acting erratic, appears to be responding with Ativan.  Monitor.  She has been started on a scheduled BuSpar.  Debility/deconditioning continue PT OT, encourage mobility.  DVT prophylaxis:SCD Code Status:FULL Family Communication: plan of care discussed with patient at bedside. Status is: Inpatient  Remains inpatient appropriate because:Inpatient level of care appropriate due to severity of illness, for continued IV antibiotics and surgical intervention. TRH will take over on telemetry.  Dispo: The patient is from: Home              Anticipated d/c is to: Home/TBD              Anticipated d/c date is: > 3 days              Patient currently is not medically stable to d/c. Nutrition: Diet Order            Diet Carb Modified Fluid consistency: Thin; Room service appropriate? Yes  Diet effective now              Nutrition Problem: Increased nutrient needs Etiology: acute illness Signs/Symptoms: estimated needs Interventions: Ensure Enlive (each supplement provides 350kcal and 20 grams of protein) Body mass index is 25.96 kg/m.  Consultants: Infectious disease, neurology, cardiology, cardiothoracic surgery.   Procedures:see note Microbiology:see note  Medications: Scheduled Meds: . acetaminophen  1,000 mg Oral Q6H   Or  . acetaminophen (TYLENOL) oral liquid 160 mg/5 mL  1,000 mg Per Tube Q6H  . [START ON 05/02/2019] aspirin EC  81 mg Oral Daily   Or  . [START ON 05/02/2019] aspirin  81 mg Per Tube Daily  . bisacodyl  10 mg Oral Daily   Or  . bisacodyl  10 mg Rectal Daily  . busPIRone  7.5 mg Oral BID  . Chlorhexidine Gluconate Cloth  6 each Topical Daily  . [START ON  05/02/2019] clopidogrel  75 mg Oral Daily  . [facility], Patient & Family Education   Does not apply Once  . docusate sodium  200 mg Oral Daily  . enoxaparin (LOVENOX) injection  30 mg Subcutaneous QHS  . escitalopram  10 mg Oral Daily  . feeding supplement (ENSURE ENLIVE)  237 mL Oral BID BM  . insulin aspart  0-24 Units Subcutaneous TID AC & HS  . metoprolol tartrate  12.5 mg Oral BID   Or  . metoprolol tartrate  12.5 mg Per Tube BID  . multivitamin with minerals  1 tablet Oral Daily  . pantoprazole  40 mg Oral Daily  . pneumococcal 23 valent vaccine  0.5 mL Intramuscular Tomorrow-1000  . QUEtiapine  25 mg Oral QHS  . sodium chloride flush  3 mL Intravenous Q12H   Continuous Infusions: . sodium chloride    . sodium chloride    . cefTRIAXone (ROCEPHIN)  IV Stopped (05/01/19 1036)  . lactated ringers Stopped (04/30/19 2141)  . vancomycin Stopped (05/01/19 1217)    Antimicrobials: Anti-infectives (From admission, onward)   Start     Dose/Rate Route Frequency Ordered Stop   04/28/19 0400  vancomycin (VANCOREADY) IVPB 1250 mg/250 mL  Status:  Discontinued     1,250 mg 166.7 mL/hr over 90 Minutes Intravenous To Surgery 04/27/19 1131 04/28/19 1451   04/28/19 0400  cefUROXime (ZINACEF) 1.5 g in sodium chloride  0.9 % 100 mL IVPB     1.5 g 200 mL/hr over 30 Minutes Intravenous To Surgery 04/27/19 1131 04/28/19 0819   04/28/19 0400  cefUROXime (ZINACEF) 750 mg in sodium chloride 0.9 % 100 mL IVPB     750 mg 200 mL/hr over 30 Minutes Intravenous To Surgery 04/27/19 1131 04/28/19 1313   04/15/19 0900  vancomycin (VANCOREADY) IVPB 750 mg/150 mL     750 mg 150 mL/hr over 60 Minutes Intravenous Every 8 hours 04/15/19 0821     04/13/19 1400  vancomycin (VANCOCIN) IVPB 1000 mg/200 mL premix  Status:  Discontinued     1,000 mg 200 mL/hr over 60 Minutes Intravenous Every 8 hours 04/13/19 0839 04/15/19 0821   04/12/19 2200  vancomycin (VANCOREADY) IVPB 750 mg/150 mL  Status:   Discontinued     750 mg 150 mL/hr over 60 Minutes Intravenous Every 8 hours 04/12/19 1500 04/13/19 0839   04/10/19 1336  vancomycin (VANCOCIN) IVPB 1000 mg/200 mL premix  Status:  Discontinued     1,000 mg 200 mL/hr over 60 Minutes Intravenous Every 8 hours 04/10/19 1320 04/12/19 1500   04/08/19 1200  cefTRIAXone (ROCEPHIN) 2 g in sodium chloride 0.9 % 100 mL IVPB     2 g 200 mL/hr over 30 Minutes Intravenous Every 12 hours 04/08/19 1013 06/09/19 2359   04/08/19 0530  vancomycin (VANCOCIN) IVPB 1000 mg/200 mL premix  Status:  Discontinued     1,000 mg 200 mL/hr over 60 Minutes Intravenous Every 12 hours 04/07/19 1541 04/10/19 1320   04/07/19 1900  Ampicillin-Sulbactam (UNASYN) 3 g in sodium chloride 0.9 % 100 mL IVPB  Status:  Discontinued     3 g 200 mL/hr over 30 Minutes Intravenous Every 6 hours 04/07/19 1457 04/08/19 1013   04/07/19 1730  vancomycin (VANCOREADY) IVPB 1500 mg/300 mL     1,500 mg 150 mL/hr over 120 Minutes Intravenous  Once 04/07/19 1541 04/07/19 1937   04/04/19 1400  ceFAZolin (ANCEF) IVPB 2g/100 mL premix  Status:  Discontinued     2 g 200 mL/hr over 30 Minutes Intravenous Every 8 hours 04/04/19 1119 04/07/19 1457   04/04/19 0400  Vancomycin (VANCOCIN) 1,250 mg in sodium chloride 0.9 % 250 mL IVPB  Status:  Discontinued     1,250 mg 166.7 mL/hr over 90 Minutes Intravenous Every 12 hours 04/03/19 1635 04/04/19 1159   04/02/19 1800  ceFEPIme (MAXIPIME) 2 g in sodium chloride 0.9 % 100 mL IVPB  Status:  Discontinued     2 g 200 mL/hr over 30 Minutes Intravenous Every 8 hours 04/02/19 1218 04/04/19 1127   04/02/19 1600  vancomycin (VANCOCIN) IVPB 1000 mg/200 mL premix  Status:  Discontinued     1,000 mg 200 mL/hr over 60 Minutes Intravenous Every 12 hours 04/02/19 0100 04/03/19 1635   04/02/19 0200  piperacillin-tazobactam (ZOSYN) IVPB 3.375 g  Status:  Discontinued     3.375 g 12.5 mL/hr over 240 Minutes Intravenous Every 8 hours 04/02/19 0059 04/02/19 1219    04/02/19 0200  vancomycin (VANCOCIN) 1,500 mg in sodium chloride 0.9 % 500 mL IVPB     1,500 mg 250 mL/hr over 120 Minutes Intravenous  Once 04/02/19 0100 04/02/19 0923   04/01/19 2040  ceFAZolin (ANCEF) 2-4 GM/100ML-% IVPB    Note to Pharmacy: Channing Mutters   : cabinet override      04/01/19 2040 04/02/19 0844       Objective: Vitals: Today's Vitals   05/01/19 1537 05/01/19 1600 05/01/19  1700 05/01/19 1800  BP:  104/82 (!) 87/76 112/90  Pulse:  95 (!) 106 (!) 108  Resp:  17 (!) 27 (!) 25  Temp: 97.6 F (36.4 C)     TempSrc: Oral     SpO2:  98% 96% 97%  Weight:      Height:      PainSc:        Intake/Output Summary (Last 24 hours) at 05/01/2019 1857 Last data filed at 05/01/2019 1800 Gross per 24 hour  Intake 1174.02 ml  Output 3380 ml  Net -2205.98 ml   Filed Weights   04/29/19 0600 04/30/19 0413 05/01/19 0500  Weight: 77.2 kg 77.3 kg 70.8 kg   Weight change: -6.539 kg   Intake/Output from previous day: 04/22 0701 - 04/23 0700 In: 1574.3 [P.O.:300; I.V.:324.4; IV Piggyback:949.9] Out: 1505 [Urine:1450; Chest Tube:55] Intake/Output this shift: Total I/O In: 354 [I.V.:5; IV Piggyback:349] Out: 2600 [Urine:2600]  Examination:  General: Appear in mild distress, no Rash; Oral Mucosa Clear, moist. no Abnormal Neck Mass Or lumps, Conjunctiva normal  Cardiovascular: S1 and S2 Present, no Murmur, Respiratory: Increased respiratory effort, Bilateral Air entry present and bilateral  Crackles, no wheezes Abdomen: Bowel Sound present, Soft and no tenderness, no hernia Extremities: no Pedal edema, no calf tenderness Neurology: alert and oriented to time and place affect appropriate.     studies CBC: Recent Labs  Lab 04/28/19 2155 04/28/19 2155 04/28/19 2205 04/29/19 0500 04/29/19 1700 04/30/19 0400 05/01/19 0412  WBC 14.0*  --   --  8.7 10.7* 8.6 7.6  HGB 9.9*   < > 10.2* 8.7* 8.8* 8.1* 7.9*  HCT 31.7*   < > 30.0* 28.2* 28.5* 26.0* 25.1*  MCV 88.8  --   --   87.9 89.3 88.1 87.2  PLT 202  --   --  181 172 154 202   < > = values in this interval not displayed.   Basic Metabolic Panel: Recent Labs  Lab 04/26/19 0530 04/27/19 0713 04/28/19 2155 04/28/19 2155 04/28/19 2205 04/29/19 0500 04/29/19 1700 04/30/19 0400 05/01/19 0412  NA 137   < > 138   < > 140 137 133* 132* 136  K 3.7   < > 4.1   < > 4.3 4.3 4.3 3.9 3.0*  CL 104   < > 109  --   --  108 102 100 102  CO2 24   < > 19*  --   --  20* 22 21* 27  GLUCOSE 87   < > 125*  --   --  109* 114* 102* 110*  BUN 12   < > 12  --   --  CREATININE 0.60   < > 0.58  --   --  0.49 0.56 0.58 0.63  CALCIUM 8.7*   < > 7.8*  --   --  8.0* 8.6* 8.4* 8.2*  MG 1.7  --  2.4  --   --  2.0 1.9  --   --   PHOS 3.8  --   --   --   --   --   --   --   --    < > = values in this interval not displayed.   GFR: Estimated Creatinine Clearance: 101.5 mL/min (by C-G formula based on SCr of 0.63 mg/dL). Liver Function Tests: Recent Labs  Lab 04/27/19 0713 05/01/19 0412  AST 23 23  ALT 15 20  ALKPHOS 80 82  BILITOT 0.6  0.3  PROT 6.2* 5.4*  ALBUMIN 2.5* 2.3*   No results for input(s): LIPASE, AMYLASE in the last 168 hours. No results for input(s): AMMONIA in the last 168 hours. Coagulation Profile: Recent Labs  Lab 04/27/19 0730 04/28/19 1500  INR 1.3* 1.4*   Cardiac Enzymes: No results for input(s): CKTOTAL, CKMB, CKMBINDEX, TROPONINI in the last 168 hours. BNP (last 3 results) No results for input(s): PROBNP in the last 8760 hours. HbA1C: No results for input(s): HGBA1C in the last 72 hours. CBG: Recent Labs  Lab 05/01/19 0103 05/01/19 0408 05/01/19 0737 05/01/19 1103 05/01/19 1534  GLUCAP 93 99 102* 89 111*   Lipid Profile: No results for input(s): CHOL, HDL, LDLCALC, TRIG, CHOLHDL, LDLDIRECT in the last 72 hours. Thyroid Function Tests: No results for input(s): TSH, T4TOTAL, FREET4, T3FREE, THYROIDAB in the last 72 hours. Anemia Panel: No results for input(s):  VITAMINB12, FOLATE, FERRITIN, TIBC, IRON, RETICCTPCT in the last 72 hours. Sepsis Labs: No results for input(s): PROCALCITON, LATICACIDVEN in the last 168 hours.  Recent Results (from the past 240 hour(s))  Surgical PCR screen     Status: None   Collection Time: 04/24/19  6:56 PM   Specimen: Nasal Mucosa; Nasal Swab  Result Value Ref Range Status   MRSA, PCR NEGATIVE NEGATIVE Final   Staphylococcus aureus NEGATIVE NEGATIVE Final    Comment: (NOTE) The Xpert SA Assay (FDA approved for NASAL specimens in patients 91 years of age and older), is one component of a comprehensive surveillance program. It is not intended to diagnose infection nor to guide or monitor treatment. Performed at [facility], [place] [address], [place], [place] [address]   MRSA PCR Screening     Status: None   Collection Time: 04/28/19  4:13 AM   Specimen: Nasopharyngeal  Result Value Ref Range Status   MRSA by PCR NEGATIVE NEGATIVE Final    Comment:        The GeneXpert MRSA Assay (FDA approved for NASAL specimens only), is one component of a comprehensive MRSA colonization surveillance program. It is not intended to diagnose MRSA infection nor to guide or monitor treatment for MRSA infections. Performed at [facility], [place] [address], [place], [place] [address]   Aerobic/Anaerobic Culture (surgical/deep wound)     Status: None (Preliminary result)   Collection Time: 04/28/19  9:06 AM   Specimen: PATH Cytology Misc. fluid; Body Fluid  Result Value Ref Range Status   Specimen Description WOUND PERICARDIAL FLUID  Final   Special Requests SPEC A SENT ON SWAB  Final   Gram Stain   Final    RARE WBC PRESENT, PREDOMINANTLY MONONUCLEAR NO ORGANISMS SEEN    Culture   Final    NO GROWTH 3 DAYS NO ANAEROBES ISOLATED; CULTURE IN PROGRESS FOR 5 DAYS Performed at [facility], [place]. [address] [place], [place] [address]    Report Status PENDING  Incomplete  Aerobic/Anaerobic Culture  (surgical/deep wound)     Status: None (Preliminary result)   Collection Time: 04/28/19 10:19 AM   Specimen: Aortic Valve Leaflets; Tissue  Result Value Ref Range Status   Specimen Description AORTIC VALVE TISSUE LEAFLETS  Final   Special Requests SPECIMEN B  Final   Gram Stain   Final    FEW WBC PRESENT,BOTH PMN AND MONONUCLEAR RARE GRAM POSITIVE COCCI    Culture   Final    NO GROWTH 2 DAYS NO GROWTH 3 DAYS Performed at [facility], [address] [place], [place] [address]    Report Status PENDING  Incomplete  Aerobic/Anaerobic Culture (surgical/deep wound)     Status: None (Preliminary result)   Collection Time: 04/28/19 11:54 AM   Specimen: Heart Valve Leaflet(s); Tissue  Result Value Ref Range Status   Specimen Description VALVE TRICUSPID  Final   Special Requests SPECIMEN C  Final   Gram Stain NO WBC SEEN NO ORGANISMS SEEN   Final   Culture   Final    NO GROWTH 3 DAYS NO ANAEROBES ISOLATED; CULTURE IN PROGRESS FOR 5 DAYS Performed at [facility], [address], [place], [place] [address]    Report Status PENDING  Incomplete      Radiology Studies: DG Chest Port 1 View  Result Date: 05/01/2019 CLINICAL DATA:  29 year old female with history of endocarditis, septic emboli. Postoperative day 3 aortic and tricuspid valve repair/replacement. EXAM: PORTABLE CHEST 1 VIEW COMPARISON:  Portable chest 04/30/2019 and earlier. FINDINGS: Portable AP upright view at 0622 hours. Right IJ introducer sheath removed. Bilateral chest tubes and right PICC line remain. Mildly improved lung volumes. Stable cardiac size and mediastinal contours. Decreasing pulmonary vascular congestion. No definite pneumothorax. Retrocardiac opacity favored to be atelectasis. Visible bowel-gas pattern within normal limits. IMPRESSION: 1. Right IJ introducer sheath removed. Bilateral chest tubes and right PICC line remain. 2. No pneumothorax identified. 3. Improved lung volumes and ventilation with  decreasing vascular congestion. Suspect basilar atelectasis. Electronically Signed   By: Odessa Fleming M.D.   On: 05/01/2019 10:22   DG Chest Port 1 View  Result Date: 04/30/2019 CLINICAL DATA:  Cardiac surgery.  Chest tube. EXAM: PORTABLE CHEST 1 VIEW COMPARISON:  04/29/2019. FINDINGS: Interim removal of Swan-Ganz catheter and mediastinal drainage catheters. Right IJ sheath in stable position. PICC line now noted with tip over SVC. Bilateral chest tubes in stable position. Prior cardiac valve replacement. Cardiomegaly. Low lung volumes with bibasilar atelectasis. Bilateral pulmonary infiltrates/edema, progressed from prior exam. No pneumothorax. IMPRESSION: 1. Interim removal Swan-Ganz catheter mediastinal drainage catheters. Right IJ sheath in stable position. PICC line now noted with tip over SVC bilateral chest tubes in stable position. 2. Prior cardiac valve replacement. Cardiomegaly. Bilateral pulmonary infiltrates/edema progressed from prior exam. 3.  Low lung volumes with bibasilar atelectasis. Electronically Signed   By: Maisie Fus  Register   On: 04/30/2019 07:39     LOS: 30 days   Time spent: More than 50% of that time was spent in counseling and/or coordination of care.  Lynden Oxford, MD [facility]  05/01/2019, 6:57 PM

## 2019-05-01 NOTE — Progress Notes (Signed)
Report called to 4E. Pt transferred to 4E17 via wheelchair and on tele monitoring by Allene Pyo, RN.

## 2019-05-02 ENCOUNTER — Inpatient Hospital Stay (HOSPITAL_COMMUNITY): Payer: Medicaid Other

## 2019-05-02 LAB — CBC
HCT: 23.4 % — ABNORMAL LOW (ref 36.0–46.0)
Hemoglobin: 7.3 g/dL — ABNORMAL LOW (ref 12.0–15.0)
MCH: 27 pg (ref 26.0–34.0)
MCHC: 31.2 g/dL (ref 30.0–36.0)
MCV: 86.7 fL (ref 80.0–100.0)
Platelets: 234 10*3/uL (ref 150–400)
RBC: 2.7 MIL/uL — ABNORMAL LOW (ref 3.87–5.11)
RDW: 18.1 % — ABNORMAL HIGH (ref 11.5–15.5)
WBC: 6.1 10*3/uL (ref 4.0–10.5)
nRBC: 0.3 % — ABNORMAL HIGH (ref 0.0–0.2)

## 2019-05-02 LAB — COMPREHENSIVE METABOLIC PANEL
ALT: 20 U/L (ref 0–44)
AST: 21 U/L (ref 15–41)
Albumin: 2.2 g/dL — ABNORMAL LOW (ref 3.5–5.0)
Alkaline Phosphatase: 74 U/L (ref 38–126)
Anion gap: 7 (ref 5–15)
BUN: 16 mg/dL (ref 6–20)
CO2: 32 mmol/L (ref 22–32)
Calcium: 8.3 mg/dL — ABNORMAL LOW (ref 8.9–10.3)
Chloride: 98 mmol/L (ref 98–111)
Creatinine, Ser: 0.65 mg/dL (ref 0.44–1.00)
GFR calc Af Amer: >60 mL/min (ref >60)
GFR calc non Af Amer: >60 mL/min (ref >60)
Glucose, Bld: 92 mg/dL (ref 70–99)
Potassium: 3.2 mmol/L — ABNORMAL LOW (ref 3.5–5.1)
Sodium: 137 mmol/L (ref 135–145)
Total Bilirubin: 0.7 mg/dL (ref 0.3–1.2)
Total Protein: 5 g/dL — ABNORMAL LOW (ref 6.5–8.1)

## 2019-05-02 LAB — VITAMIN B12: Vitamin B-12: 433 pg/mL (ref 180–914)

## 2019-05-02 LAB — IRON AND TIBC
Iron: 19 ug/dL — ABNORMAL LOW (ref 28–170)
Saturation Ratios: 9 % — ABNORMAL LOW (ref 10.4–31.8)
TIBC: 223 ug/dL — ABNORMAL LOW (ref 250–450)
UIBC: 204 ug/dL

## 2019-05-02 LAB — GLUCOSE, CAPILLARY
Glucose-Capillary: 92 mg/dL (ref 70–99)
Glucose-Capillary: 102 mg/dL — ABNORMAL HIGH (ref 70–99)

## 2019-05-02 LAB — FOLATE: Folate: 7.3 ng/mL (ref >5.9)

## 2019-05-02 LAB — MAGNESIUM: Magnesium: 1.6 mg/dL — ABNORMAL LOW (ref 1.7–2.4)

## 2019-05-02 LAB — FERRITIN: Ferritin: 132 ng/mL (ref 11–307)

## 2019-05-02 MED ORDER — POLYETHYLENE GLYCOL 3350 17 G PO PACK
17.0000 g | PACK | Freq: Every day | ORAL | Status: DC
  Administered 2019-05-02: 17 g via ORAL

## 2019-05-02 MED ORDER — POTASSIUM CHLORIDE CRYS ER 20 MEQ PO TBCR: 40.0000 meq | EXTENDED_RELEASE_TABLET | Freq: Once | ORAL | Status: DC

## 2019-05-02 MED ORDER — FUROSEMIDE 40 MG PO TABS
40.0000 mg | ORAL_TABLET | Freq: Every day | ORAL | Status: DC
  Filled 2019-05-02: qty 1

## 2019-05-02 MED ORDER — LACTATED RINGERS IV BOLUS
500.0000 mL | Freq: Once | INTRAVENOUS | Status: AC
  Administered 2019-05-02: 500 mL via INTRAVENOUS

## 2019-05-02 MED ORDER — MAGNESIUM SULFATE 2 GM/50ML IV SOLN
2.0000 g | Freq: Once | INTRAVENOUS | Status: AC
  Administered 2019-05-02: 2 g via INTRAVENOUS
  Filled 2019-05-02: qty 50

## 2019-05-02 MED ORDER — POTASSIUM CHLORIDE CRYS ER 20 MEQ PO TBCR
30.0000 meq | EXTENDED_RELEASE_TABLET | Freq: Three times a day (TID) | ORAL | Status: AC
  Administered 2019-05-02 (×3): 30 meq via ORAL
  Filled 2019-05-02 (×3): qty 1

## 2019-05-02 MED ORDER — FE FUMARATE-B12-VIT C-FA-IFC PO CAPS
1.0000 | ORAL_CAPSULE | Freq: Two times a day (BID) | ORAL | Status: DC
  Administered 2019-05-02 – 2019-06-09 (×77): 1 via ORAL
  Filled 2019-05-02 (×77): qty 1

## 2019-05-02 MED ORDER — IBUPROFEN 600 MG PO TABS
600.0000 mg | ORAL_TABLET | Freq: Once | ORAL | Status: AC
  Administered 2019-05-02: 600 mg via ORAL
  Filled 2019-05-02: qty 1

## 2019-05-02 NOTE — Progress Notes (Addendum)
[address]       [place] [address]             [phone]        4 Days Post-Op Procedure(s) (LRB): AORTIC VALVE REPLACEMENT (AVR) using Edwards PERIMOUNT Magna Ease Pericardial Bioprosthesis - 29 MM Aortic Valve. (N/A) REPAIR OF TRICUSPID (N/A) TRANSESOPHAGEAL ECHOCARDIOGRAM (TEE) (N/A)  Subjective: Patient without specific complaints this am. She states she thought her wound was "leaking at the bottom"  Objective: Vital signs in last 24 hours: Temp:  [97.6 F (36.4 C)-100.2 F (37.9 C)] 98.9 F (37.2 C) (04/24 0350) Pulse Rate:  [42-111] 95 (04/24 0350) Cardiac Rhythm: Sinus tachycardia (04/23 2001) Resp:  [14-31] 23 (04/24 0659) BP: (87-126)/(59-101) 100/74 (04/24 0659) SpO2:  [95 %-100 %] 95 % (04/24 0350) Weight:  [73.7 kg-73.8 kg] 73.7 kg (04/24 0342)  Current Weight  05/02/19 73.7 kg      Intake/Output from previous day: 04/23 0701 - 04/24 0700 In: 954 [I.V.:5; IV Piggyback:949] Out: 2600 [Urine:2600]   Physical Exam:  Cardiovascular: Slightly tachcyardic Pulmonary: Clear to auscultation bilaterally Abdomen: Soft, non tender, bowel sounds present. Extremities: Trace bilateral lower extremity edema. Wounds: Clean and dry.  No erythema or signs of infection. No drainage from lower sternal wound.  Lab Results: CBC: Recent Labs    05/01/19 0412 05/02/19 0340  WBC 7.6 6.1  HGB 7.9* 7.3*  HCT 25.1* 23.4*  PLT 202 234   BMET:  Recent Labs    05/01/19 0412 05/02/19 0340  NA 136 137  K 3.0* 3.2*  CL 102 98  CO2 27 32  GLUCOSE 110* 92  BUN 9 16  CREATININE 0.63 0.65  CALCIUM 8.2* 8.3*    PT/INR:  Lab Results  Component Value Date   INR 1.4 (H) 04/28/2019   INR 1.3 (H) 04/27/2019   INR 1.5 (H) 04/01/2019   ABG:  INR: Will add last result for INR, ABG once components are confirmed Will add last 4 CBG results once components are confirmed  Assessment/Plan:  1. CV - SR with HR in the 90's. On Lopressor 12.5  mg bid 2.  Pulmonary - On room air. CXR this am appears stable (no pneumothorax, bibasilar atelectasis). Encourage incentive spirometer. 3. Volume Overload - Will give oral Lasix today 4.  Expected post op acute blood loss anemia - H and H this am slightly decreased to 7.3 and 23.4. Continue Trinsicon. 5. ID-on Vancomycin and Ceftriaxone, GPC on gram stain. Patient will need 6 weeks of IV antibiotic (end date 05/31) 6. Neurology-CVA. s/p leftcommon carotid arteriogram followed by complete revascularization of occluded M3 region of inferior division ofleft MCA complicated by dissection/pseudoaneurysm of left ICA-s/p placement of a 4.5 mm x 20mm pipeline flow diverter at site of focal dissection associated with a small pseudoaneurysm of distal cervical LT ICA.  DAPT (asa and plavix) recommended for 3-6 months but Dr. Corliss Skains to determine exact regimen and patient will need to follow up in the stroke clinic with Dr. Pearlean Brownie after discharge 7. GI-per speech pathology, regular;Thin liquid  Liquid Administration via: Cup;Straw Medication Administration: Whole meds with puree Supervision: Patient able to self feed  8. History of poly stubstance abuse-will need assistance with rehab 9. Supplement potassium 10. CBGs 111/105/92. No history of diabetes so will stop accu checks and SS PRN 11. Will remove EPW in am  Donielle M ZimmermanPA-C 05/02/2019,8:17 AM    Chart reviewed, patient examined, agree with above. CXR shows small  bilateral pleural effusions.  Started on lasix.

## 2019-05-02 NOTE — Progress Notes (Addendum)
Pharmacy Antibiotic Note Lauren Reyes is a 29 y.o. female with endocarditis involving aortic, mitral, and tricuspid valves with severe aortic regurgitation. Patient's condition is further complicated by septic emboli to her brain, lungs, and spleen. Respiratory culture grew MSSA, however, recent blood cultures are negative. She is s/p AVR/MVR/TVR on 4/20.  *Noted that doses are being adjusted on troughs alone due to concern of cerebritis *F/u CMV DNA PCR and possible need for treatment - ID following  Updates: -All valve cultures negative -Last vancomycin trough 4/21 = 16 (therapeutic) -WBC= 6.1, afebrile, SCr= 0.65  Plan: - Continue Vancomycin at 750 mg IV q 8 hours  - Consider weekly checks while renal function remains stable - Continue ceftriaxone IV 2g q12 per ID - Monitor renal function, clinical improvement, WBC, and temperature - End date for antibiotics: 06/09/19  Height: 5\' 6"  (167.6 cm) Weight: 73.7 kg (162 lb 6 oz) IBW/kg (Calculated) : 59.3  Temp (24hrs), Avg:98.8 F (37.1 C), Min:97.4 F (36.3 C), Max:100.2 F (37.9 C)  Recent Labs  Lab 04/29/19 0500 04/29/19 1100 04/29/19 1700 04/30/19 0400 05/01/19 0412 05/02/19 0340  WBC 8.7  --  10.7* 8.6 7.6 6.1  CREATININE 0.49  --  0.56 0.58 0.63 0.65  VANCOTROUGH  --  16  --   --   --   --     Estimated Creatinine Clearance: 105.7 mL/min (by C-G formula based on SCr of 0.65 mg/dL).    No Known Allergies  Antimicrobials this admission: Zosyn 3/25 >> 3/25 Cefepime 3/25 >> 3/27 Ancef 3/27 >> 3/30 Vanc 3/25 >> 3/27; restarted 3/30 >> (6/1) Unasyn 3/30>>3/31 Ceftriaxone 3/31>>(6/1)  Microbiology results: 3/24 COVID/Flu >> neg 3/25 RCx >> moderate MSSA 3/25 MRSA PCR >> negative 3/25 BCx >> negative  4/16 MRSA PCR >> neg 4/20 MRSA PCR >> neg 4/20 Tricuspid valve specimen: neg 4/20 pericardial fluid: neg 4/20 aortic valve leaflets: neg    Thank you for allowing pharmacy to be a part of this patient's  care.  5/20, PharmD PGY1 Ambulatory Care Pharmacy Resident

## 2019-05-02 NOTE — Progress Notes (Signed)
[facility] Progress Note  Patient: Lauren Reyes    DQQ:[id number]  DOA: 04/01/2019     Date of Service: the patient was seen and examined on 05/02/2019  Chief Complaint  Patient presents with  . Code Stroke   Brief hospital course: 29 year old female with past medical history with substance abuse with heroin, crystal meth and cocaine as well as smoking presents to the ER with aphasia and right-sided weakness and code stroke.  Admitted to neurology ICU.  Patient was intubated underwent thrombectomy for left MCA M3 occlusion and recanalization.  This was complicated by dissection and pseudoaneurysm of the left ICA SP stent placement. Extubated on April 04, 2019.  Also had a DVT but treated with IVC filter.  TEE showed infective endocarditis.  SP bioprosthetic aortic valve replacement and tricuspid valve repair. Currently further plan is continue IV antibiotics for endocarditis .  Assessment and Plan: 1.  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 SP aortic valve replacement with bioprosthetic valve and tricuspid repair. MSSA in sputum. CMV infection Initially presented with code stroke, intubated and admitted to the neuro ICU. Tracheal aspirate grew MSSA. Imaging showed large septic emboli in left hemisphere of the brain, lung, spleen as well as the neck and renal infarcts. Initially patient was on IV vancomycin and Unasyn. So far tissue culture with no growth to date. Remains on IV vancomycin and IV ceftriaxone. 6 weeks of antibiotics post surgery through Jun 08, 2019. On the path report staining looks like CMV in some areas. PCR DNA is ordered currently pending. PICC line inserted. Patient will remain in a supervised setting for completion of the antibiotics.  2.  Aortic valve endocarditis SP aortic valve replacement 04/28/2019 and tricuspid repair Cardiothoracic surgery consult appreciated.  Admitted in the ICU postprocedure. Currently  extubated on room air. Management per cardiothoracic surgery.  3.  Acute CVA of the MCA territory.  Embolic in nature. TPA was deferred as concern for septic emboli. SP left common carotid arteriogram with complete revascularization of the occluded M3. Complicated by dissection and pseudoaneurysm of the left ICA SP placement of 4.5 mm x 25 mm pipeline flow diverter stent. Initially was on aspirin and Brilinta.  Brilinta was held for cardiothoracic surgery. Patient has been off [place] but on aspirin for cardiothoracic surgery. Patient has intracranial stenting therefore will require dual antiplatelet therapy with aspirin and Plavix per neurology. Continue this for 3 to 6 months. Patient will follow up with Dr. Estanislado Pandy and decide on exact regimen. Patient will also require follow-up with neurology.  4.  Right common femoral DVT Right groin hematoma IVC filter placement April 03, 2019. During the hospitalization patient was found to have right common femoral DVT. Patient also was found to have large right groin hematoma post vascular intervention Decision was made to place IVC filter placement since the patient was felt not a candidate for therapeutic anticoagulation given her septic emboli, dissection as well as hematoma.  5.  Acute blood loss anemia. Iron deficiency anemia. B12 deficiency. Folate deficiency. Continue supplementation. H&H relatively stable. No active bleeding identified. No GI bleed. Transfer to hemoglobin less than 7 or hemodynamic instability.  6.  Polysubstance abuse. UDS positive for THC, amphetamine admission. Suspected IV drug use as well with needle marks. Currently on oral pain medication. Out of the window for any withdrawals.  7.  Anxiety. Currently on BuSpar, Lexapro, Seroquel.  May not require Seroquel at discharge. As needed Ativan.  8.  Hepatitis C antibody positive. Negative  viral load. HIV screen negative. RPR negative. Will require  outpatient follow-up and therapy.  9.  Pain control. On as needed oxycodone.  We will start working on a discharge plan.  10.  Sinus tachycardia. On Lopressor 12.5 mg twice daily.  Hold for hypotension.  Parameters added.  11.  Constipation. Bowel regimen adjusted.  12.  Hypokalemia. Currently replacing. We will monitor.  Body mass index is 26.21 kg/m.  Nutrition Problem: Increased nutrient needs Etiology: acute illness Interventions: Interventions: Ensure Enlive (each supplement provides 350kcal and 20 grams of protein)  Diet: Regular diet DVT Prophylaxis: Subcutaneous Heparin    Advance goals of care discussion: Full code  Family Communication: no family was present at bedside, at the time of interview.   Disposition:  Status is: Inpatient  Remains inpatient appropriate because:Unsafe d/c plan and Inpatient level of care appropriate due to severity of illness   Dispo: The patient is from: Home              Anticipated d/c is to: to be detrermined               Anticipated d/c date is: > 3 days              Patient currently is not medically stable to d/c.         Subjective: Pain well controlled.  No nausea no vomiting.  No fever no chills.  Sleeping okay.  No bowel movement but passing gas.  Physical Exam: General:  alert oriented to time, place, and person.  Appear in mild distress, affect appropriate Eyes: PERRL ENT: Oral Mucosa Clear, moist  Neck: difficult to assess  JVD,  Cardiovascular: S1 and S2 Present, no Murmur,  Respiratory: increased respiratory effort, Bilateral Air entry equal and Decreased, bilateral  Crackles, no wheezes Abdomen: Bowel Sound present, Soft and no tenderness,  Skin: no rash Extremities: no Pedal edema, no calf tenderness Neurologic: without any new focal findings Gait not checked due to patient safety concerns  Vitals:   05/02/19 0342 05/02/19 0350 05/02/19 0659 05/02/19 0935  BP:  (!) 87/59 100/74 99/76  Pulse:  95      Resp:  14 (!) 23 20  Temp:  98.9 F (37.2 C)  (!) 97.4 F (36.3 C)  TempSrc:  Oral  Axillary  SpO2:  95%  95%  Weight: 73.7 kg     Height:        Intake/Output Summary (Last 24 hours) at 05/02/2019 1031 Last data filed at 05/02/2019 0600 Gross per 24 hour  Intake 854.02 ml  Output 1600 ml  Net -745.98 ml   Filed Weights   05/01/19 0500 05/01/19 1935 05/02/19 0342  Weight: 70.8 kg 73.8 kg 73.7 kg    Data Reviewed: I have personally reviewed and interpreted daily labs, tele strips, imagings as discussed above. I reviewed all nursing notes, pharmacy notes, vitals, pertinent old records I have discussed plan of care as described above with RN and patient/family.  CBC: Recent Labs  Lab 04/29/19 0500 04/29/19 1700 04/30/19 0400 05/01/19 0412 05/02/19 0340  WBC 8.7 10.7* 8.6 7.6 6.1  HGB 8.7* 8.8* 8.1* 7.9* 7.3*  HCT 28.2* 28.5* 26.0* 25.1* 23.4*  MCV 87.9 89.3 88.1 87.2 86.7  PLT 181 172 154 202 234   Basic Metabolic Panel: Recent Labs  Lab 04/26/19 0530 04/27/19 0713 04/28/19 2155 04/28/19 2205 04/29/19 0500 04/29/19 1700 04/30/19 0400 05/01/19 0412 05/02/19 0340  NA 137   < > 138   < >  137 133* 132* 136 137  K 3.7   < > 4.1   < > 4.3 4.3 3.9 3.0* 3.2*  CL 104   < > 109   < > 108 102 100 102 98  CO2 24   < > 19*   < > 20* 22 21* 27 32  GLUCOSE 87   < > 125*   < > 109* 114* 102* 110* 92  BUN 12   < > 12   < > 11 10 12 9 16   CREATININE 0.60   < > 0.58   < > 0.49 0.56 0.58 0.63 0.65  CALCIUM 8.7*   < > 7.8*   < > 8.0* 8.6* 8.4* 8.2* 8.3*  MG 1.7  --  2.4  --  2.0 1.9  --   --  1.6*  PHOS 3.8  --   --   --   --   --   --   --   --    < > = values in this interval not displayed.    Studies: No results found.  Scheduled Meds: . acetaminophen  1,000 mg Oral Q6H   Or  . acetaminophen (TYLENOL) oral liquid 160 mg/5 mL  1,000 mg Per Tube Q6H  . aspirin EC  81 mg Oral Daily   Or  . aspirin  81 mg Per Tube Daily  . bisacodyl  10 mg Oral Daily   Or  .  bisacodyl  10 mg Rectal Daily  . busPIRone  7.5 mg Oral BID  . Chlorhexidine Gluconate Cloth  6 each Topical Daily  . clopidogrel  75 mg Oral Daily  . [place] [facility]iac Surgery, Patient & Family Education   Does not apply Once  . docusate sodium  200 mg Oral Daily  . enoxaparin (LOVENOX) injection  30 mg Subcutaneous QHS  . escitalopram  10 mg Oral Daily  . feeding supplement (ENSURE ENLIVE)  237 mL Oral BID BM  . ferrous fumarate-b12-vitamic C-folic acid  1 capsule Oral BID PC  . furosemide  40 mg Oral Daily  . metoprolol tartrate  12.5 mg Oral BID   Or  . metoprolol tartrate  12.5 mg Per Tube BID  . pantoprazole  40 mg Oral Daily  . pneumococcal 23 valent vaccine  0.5 mL Intramuscular Tomorrow-1000  . potassium chloride  30 mEq Oral TID  . QUEtiapine  25 mg Oral QHS  . sodium chloride flush  3 mL Intravenous Q12H   Continuous Infusions: . sodium chloride    . sodium chloride    . cefTRIAXone (ROCEPHIN)  IV 2 g (05/02/19 0951)  . lactated ringers Stopped (04/30/19 2141)  . magnesium sulfate bolus IVPB    . vancomycin 750 mg (05/02/19 0350)   PRN Meds: sodium chloride, LORazepam, ondansetron (ZOFRAN) IV, oxyCODONE, sodium chloride flush  Time spent: 35 minutes  Author: 05/04/19, MD [facility] 05/02/2019 10:31 AM  To reach On-call, see care teams to locate the attending and reach out to them via [email or web address]. If 7PM-7AM, please contact night-coverage If you still have difficulty reaching the attending provider, please page the [facility] (Director on Call) for [facility] on amion for assistance.

## 2019-05-02 NOTE — Progress Notes (Signed)
Pt arrived to the floor during shift change via a WC from 2H. Patient is alert and oriented ambulated to the bed with minimal assist. Stand up weight obtained, VSS , pt denies pain at this time. Heart monitor applied, CCMD notified. We'll continue to monitor.

## 2019-05-02 NOTE — Progress Notes (Signed)
CARDIAC REHAB PHASE I   PRE:  Rate/Rhythm: 105 ST  BP:  Supine: 112/88  Sitting:   Standing:    SaO2: 98%RA  MODE:  Ambulation: 470 ft   POST:  Rate/Rhythm: 133 ST  BP:  Supine:   Sitting: 100/79  Standing:    SaO2: 100%RA 1104-1134 Pt walked 470 ft on RA with rolling walker with minimal asst. Reinforced sternal precautions. Stopped once to rest . HR elevated with walk but tolerated well. Back to bed after BSC.   Luetta Nutting, RN BSN  05/02/2019 11:32 AM

## 2019-05-03 LAB — AEROBIC/ANAEROBIC CULTURE W GRAM STAIN (SURGICAL/DEEP WOUND)
Culture: NO GROWTH
Culture: NO GROWTH
Culture: NO GROWTH
Gram Stain: NONE SEEN

## 2019-05-03 LAB — BASIC METABOLIC PANEL
Anion gap: 7 (ref 5–15)
BUN: 10 mg/dL (ref 6–20)
CO2: 28 mmol/L (ref 22–32)
Calcium: 8.5 mg/dL — ABNORMAL LOW (ref 8.9–10.3)
Chloride: 102 mmol/L (ref 98–111)
Creatinine, Ser: 0.52 mg/dL (ref 0.44–1.00)
GFR calc Af Amer: >60 mL/min (ref >60)
GFR calc non Af Amer: >60 mL/min (ref >60)
Glucose, Bld: 95 mg/dL (ref 70–99)
Potassium: 3.8 mmol/L (ref 3.5–5.1)
Sodium: 137 mmol/L (ref 135–145)

## 2019-05-03 LAB — MAGNESIUM: Magnesium: 1.9 mg/dL (ref 1.7–2.4)

## 2019-05-03 MED ORDER — POLYETHYLENE GLYCOL 3350 17 G PO PACK: 17.0000 g | PACK | Freq: Every day | ORAL | Status: DC | PRN

## 2019-05-03 MED ORDER — FUROSEMIDE 10 MG/ML IJ SOLN
40.0000 mg | Freq: Once | INTRAMUSCULAR | Status: AC
  Administered 2019-05-03: 40 mg via INTRAVENOUS
  Filled 2019-05-03: qty 4

## 2019-05-03 MED ORDER — CLOTRIMAZOLE 1 % VA CREA
1.0000 | TOPICAL_CREAM | Freq: Every day | VAGINAL | Status: AC
  Administered 2019-05-03 – 2019-05-09 (×6): 1 via VAGINAL
  Filled 2019-05-03: qty 45

## 2019-05-03 MED ORDER — POTASSIUM CHLORIDE CRYS ER 20 MEQ PO TBCR
20.0000 meq | EXTENDED_RELEASE_TABLET | Freq: Two times a day (BID) | ORAL | Status: AC
  Administered 2019-05-03 (×2): 20 meq via ORAL
  Filled 2019-05-03 (×2): qty 1

## 2019-05-03 MED ORDER — FUROSEMIDE 40 MG PO TABS: 40.0000 mg | ORAL_TABLET | Freq: Every day | ORAL | Status: DC

## 2019-05-03 MED ORDER — FLUCONAZOLE 150 MG PO TABS
150.0000 mg | ORAL_TABLET | Freq: Once | ORAL | Status: AC
  Administered 2019-05-03: 150 mg via ORAL
  Filled 2019-05-03: qty 1

## 2019-05-03 MED ORDER — FUROSEMIDE 20 MG PO TABS
20.0000 mg | ORAL_TABLET | Freq: Every day | ORAL | Status: DC
  Administered 2019-05-04 – 2019-05-06 (×3): 20 mg via ORAL
  Filled 2019-05-03 (×3): qty 1

## 2019-05-03 MED ORDER — POTASSIUM CHLORIDE CRYS ER 20 MEQ PO TBCR
20.0000 meq | EXTENDED_RELEASE_TABLET | Freq: Every day | ORAL | Status: DC
  Administered 2019-05-04 – 2019-05-06 (×3): 20 meq via ORAL
  Filled 2019-05-03 (×3): qty 1

## 2019-05-03 NOTE — Progress Notes (Signed)
EPWs removed per protocol.  Pt tolerated well, sites unremarkable, all tips intact.  Pt understands bedrest for 1 hr with frequent VS.  CCMD notified, will monitor closely.

## 2019-05-03 NOTE — Progress Notes (Signed)
[facility] Progress Note  Patient: Lauren Reyes    OVF:[id number]  DOA: 04/01/2019     Date of Service: the patient was seen and examined on 05/03/2019  Chief Complaint  Patient presents with  . Code Stroke   Brief hospital course: 29 year old female with past medical history with substance abuse with heroin, crystal meth and cocaine as well as smoking presents to the ER with aphasia and right-sided weakness and code stroke.  Admitted to neurology ICU.  Patient was intubated underwent thrombectomy for left MCA M3 occlusion and recanalization.  This was complicated by dissection and pseudoaneurysm of the left ICA SP stent placement. Extubated on April 04, 2019.  Also had a DVT but treated with IVC filter.  TEE showed infective endocarditis. SP bioprosthetic aortic valve replacement and tricuspid valve repair. Currently further plan is continue IV antibiotics for endocarditis .  Assessment and Plan: 1.  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 SP aortic valve replacement with bioprosthetic valve and tricuspid repair. MSSA in sputum. CMV infection Initially presented with code stroke, intubated and admitted to the neuro ICU. Tracheal aspirate grew MSSA. Imaging showed large septic emboli in left hemisphere of the brain, lung, spleen as well as the neck and renal infarcts. Initially patient was on IV vancomycin and Unasyn. So far tissue culture with no growth to date. Remains on IV vancomycin and IV ceftriaxone. 6 weeks of antibiotics post surgery through Jun 08, 2019. On the path report staining looks like CMV in some areas. PCR DNA is ordered currently pending. PICC line inserted. Patient will remain in a supervised setting for completion of the antibiotics.  2.  Aortic valve endocarditis SP aortic valve replacement 04/28/2019 and tricuspid repair Cardiothoracic surgery consult appreciated.  Admitted in the ICU postprocedure. Currently  extubated on room air. Management per cardiothoracic surgery.  3.  Acute CVA of the MCA territory.  Embolic in nature. TPA was deferred as concern for septic emboli. SP left common carotid arteriogram with complete revascularization of the occluded M3. Complicated by dissection and pseudoaneurysm of the left ICA SP placement of 4.5 mm x 25 mm pipeline flow diverter stent. Initially was on aspirin and Brilinta.  Brilinta was held for cardiothoracic surgery. Patient has been off Brilinta but on aspirin for cardiothoracic surgery. Patient has intracranial stenting therefore will require dual antiplatelet therapy with aspirin and Plavix per neurology. Continue this for 3 to 6 months. Patient will follow up with Dr. Corliss Skains and decide on exact regimen. Patient will also require follow-up with neurology.  4.  Right common femoral DVT Right groin hematoma IVC filter placement April 03, 2019. During the hospitalization patient was found to have right common femoral DVT. Patient also was found to have large right groin hematoma post vascular intervention Decision was made to place IVC filter placement since the patient was felt not a candidate for therapeutic anticoagulation given her septic emboli, dissection as well as hematoma.  5.  Acute blood loss anemia. Iron deficiency anemia. B12 deficiency. Folate deficiency. Continue supplementation. H&H relatively stable. No active bleeding identified. No GI bleed. Transfer to hemoglobin less than 7 or hemodynamic instability.  6.  Polysubstance abuse. UDS positive for THC, amphetamine admission. Suspected IV drug use as well with needle marks. Currently on oral pain medication. Out of the window for any withdrawals.  7.  Anxiety. Currently on BuSpar, Lexapro, Seroquel.  May not require Seroquel at discharge. As needed Ativan.  8.  Hepatitis C antibody positive. Negative viral  load. HIV screen negative. RPR negative. Will require  outpatient follow-up and therapy.  9.  Pain control. On as needed oxycodone.  We will start working on a discharge plan.  10.  Sinus tachycardia. On Lopressor 12.5 mg twice daily.  Hold for hypotension.  Parameters added.  11.  Constipation. Resolved  Bowel regimen adjusted.  12.  Hypokalemia. Replaced  We will monitor.  13. Yeast infection Clotrimazole for 7 days.   14. HFpEF On lasix monitor, reduced the dose from 40 mg daily to 20 mg daily due to tachycardia and hypotension   Body mass index is 26.97 kg/m.  Nutrition Problem: Increased nutrient needs Etiology: acute illness Interventions: Interventions: Ensure Enlive (each supplement provides 350kcal and 20 grams of protein)  Diet: Regular diet DVT Prophylaxis: Subcutaneous Heparin    Advance goals of care discussion: Full code  Family Communication: no family was present at bedside, at the time of interview.   Disposition:  Status is: Inpatient  Remains inpatient appropriate because:Unsafe d/c plan and Inpatient level of care appropriate due to severity of illness   Dispo: The patient is from: Home              Anticipated d/c is to: to be detrermined               Anticipated d/c date is: > 3 days              Patient currently is not medically stable to d/c.  Subjective: no acute complains no fever or chills. Had an accident with BM this AM. Reports having sensation of 'yeast infection' with itching  Physical Exam: General: Appear in mild distress, no Rash; Oral Mucosa Clear, moist. no Abnormal Neck Mass Or lumps, Conjunctiva normal  Cardiovascular: S1 and S2 Present, no Murmur, Respiratory: normal respiratory effort, Bilateral Air entry present and Clear to Auscultation, no Crackles, no wheezes Abdomen: Bowel Sound present, Soft and no tenderness, no hernia Extremities: no Pedal edema, no calf tenderness Neurology: alert and oriented to time, place, and person affect appropriate  Vitals:   05/02/19  1658 05/02/19 2027 05/03/19 0231 05/03/19 0300  BP: 110/75 122/82  113/81  Pulse: 89 (!) 107  100  Resp: 20 13  20   Temp: 98.6 F (37 C) 97.6 F (36.4 C)  98.9 F (37.2 C)  TempSrc: Oral Oral  Oral  SpO2: 95% 100%  100%  Weight:   75.8 kg   Height:        Intake/Output Summary (Last 24 hours) at 05/03/2019 0949 Last data filed at 05/03/2019 5409 Gross per 24 hour  Intake 500 ml  Output --  Net 500 ml   Filed Weights   05/01/19 1935 05/02/19 0342 05/03/19 0231  Weight: 73.8 kg 73.7 kg 75.8 kg    Data Reviewed: I have personally reviewed and interpreted daily labs, tele strips, imagings as discussed above. I reviewed all nursing notes, pharmacy notes, vitals, pertinent old records I have discussed plan of care as described above with RN and patient/family.  CBC: Recent Labs  Lab 04/29/19 0500 04/29/19 1700 04/30/19 0400 05/01/19 0412 05/02/19 0340  WBC 8.7 10.7* 8.6 7.6 6.1  HGB 8.7* 8.8* 8.1* 7.9* 7.3*  HCT 28.2* 28.5* 26.0* 25.1* 23.4*  MCV 87.9 89.3 88.1 87.2 86.7  PLT 181 172 154 202 811   Basic Metabolic Panel: Recent Labs  Lab 04/28/19 2155 04/28/19 2205 04/29/19 0500 04/29/19 0500 04/29/19 1700 04/30/19 0400 05/01/19 0412 05/02/19 0340 05/03/19 0502  NA  138   < > 137   < > 133* 132* 136 137 137  K 4.1   < > 4.3   < > 4.3 3.9 3.0* 3.2* 3.8  CL 109   < > 108   < > 102 100 102 98 102  CO2 19*   < > 20*   < > 22 21* 27 32 28  GLUCOSE 125*   < > 109*   < > 114* 102* 110* 92 95  BUN 12   < > 11   < > 10 12 9 16 10   CREATININE 0.58   < > 0.49   < > 0.56 0.58 0.63 0.65 0.52  CALCIUM 7.8*   < > 8.0*   < > 8.6* 8.4* 8.2* 8.3* 8.5*  MG 2.4  --  2.0  --  1.9  --   --  1.6* 1.9   < > = values in this interval not displayed.    Studies: No results found.  Scheduled Meds: . acetaminophen  1,000 mg Oral Q6H   Or  . acetaminophen (TYLENOL) oral liquid 160 mg/5 mL  1,000 mg Per Tube Q6H  . aspirin EC  81 mg Oral Daily   Or  . aspirin  81 mg Per Tube Daily   . bisacodyl  10 mg Oral Daily   Or  . bisacodyl  10 mg Rectal Daily  . busPIRone  7.5 mg Oral BID  . Chlorhexidine Gluconate Cloth  6 each Topical Daily  . clopidogrel  75 mg Oral Daily  . clotrimazole  1 Applicatorful Vaginal QHS  . [place] Cardiac Surgery, Patient & Family Education   Does not apply Once  . docusate sodium  200 mg Oral Daily  . enoxaparin (LOVENOX) injection  30 mg Subcutaneous QHS  . escitalopram  10 mg Oral Daily  . feeding supplement (ENSURE ENLIVE)  237 mL Oral BID BM  . ferrous fumarate-b12-vitamic C-folic acid  1 capsule Oral BID PC  . fluconazole  150 mg Oral Once  . [START ON 05/04/2019] furosemide  20 mg Oral Daily  . metoprolol tartrate  12.5 mg Oral BID   Or  . metoprolol tartrate  12.5 mg Per Tube BID  . pantoprazole  40 mg Oral Daily  . pneumococcal 23 valent vaccine  0.5 mL Intramuscular Tomorrow-1000  . potassium chloride  20 mEq Oral BID  . [START ON 05/04/2019] potassium chloride  20 mEq Oral Daily  . QUEtiapine  25 mg Oral QHS  . sodium chloride flush  3 mL Intravenous Q12H   Continuous Infusions: . sodium chloride    . sodium chloride    . cefTRIAXone (ROCEPHIN)  IV 2 g (05/02/19 2307)  . lactated ringers Stopped (04/30/19 2141)  . vancomycin 750 mg (05/03/19 0337)   PRN Meds: sodium chloride, LORazepam, ondansetron (ZOFRAN) IV, oxyCODONE, polyethylene glycol, sodium chloride flush  Time spent: 35 minutes  Author: 05/05/19, MD [facility] 05/03/2019 9:49 AM  To reach On-call, see care teams to locate the attending and reach out to them via [email or web address]. If 7PM-7AM, please contact night-coverage If you still have difficulty reaching the attending provider, please page the [facility] (Director on Call) for [facility] on amion for assistance.

## 2019-05-03 NOTE — Progress Notes (Addendum)
      [address]       [place] [address]             [phone]        5 Days Post-Op Procedure(s) (LRB): AORTIC VALVE REPLACEMENT (AVR) using Edwards PERIMOUNT Magna Ease Pericardial Bioprosthesis - 29 MM Aortic Valve. (N/A) REPAIR OF TRICUSPID (N/A) TRANSESOPHAGEAL ECHOCARDIOGRAM (TEE) (N/A)  Subjective: Patient thinks she has a yeast infection from antibiotic  Objective: Vital signs in last 24 hours: Temp:  [97.4 F (36.3 C)-98.9 F (37.2 C)] 98.9 F (37.2 C) (04/25 0300) Pulse Rate:  [89-107] 100 (04/25 0300) Cardiac Rhythm: Normal sinus rhythm (04/25 0704) Resp:  [13-20] 20 (04/25 0300) BP: (99-122)/(75-88) 113/81 (04/25 0300) SpO2:  [95 %-100 %] 100 % (04/25 0300) Weight:  [75.8 kg] 75.8 kg (04/25 0231)  Current Weight  05/03/19 75.8 kg      Intake/Output from previous day: 04/24 0701 - 04/25 0700 In: 500 [IV Piggyback:500] Out: -    Physical Exam:  Cardiovascular: Slightly tachcyardic Pulmonary: Clear to auscultation bilaterally Abdomen: Soft, non tender, bowel sounds present. Extremities: Trace bilateral lower extremity edema. Wounds: Clean and dry.  No erythema or signs of infection. No drainage from lower sternal wound.  Lab Results: CBC: Recent Labs    05/01/19 0412 05/02/19 0340  WBC 7.6 6.1  HGB 7.9* 7.3*  HCT 25.1* 23.4*  PLT 202 234   BMET:  Recent Labs    05/02/19 0340 05/03/19 0502  NA 137 137  K 3.2* 3.8  CL 98 102  CO2 32 28  GLUCOSE 92 95  BUN 16 10  CREATININE 0.65 0.52  CALCIUM 8.3* 8.5*    PT/INR:  Lab Results  Component Value Date   INR 1.4 (H) 04/28/2019   INR 1.3 (H) 04/27/2019   INR 1.5 (H) 04/01/2019   ABG:  INR: Will add last result for INR, ABG once components are confirmed Will add last 4 CBG results once components are confirmed  Assessment/Plan:  1. CV - SR with HR in the 90's. On Lopressor 12.5 mg bid 2.  Pulmonary - On room air. CXR this am appears stable (no pneumothorax,  bibasilar atelectasis). Encourage incentive spirometer. 3. Volume Overload - Will give IV Lasix today 4.  Expected post op acute blood loss anemia - H and H this am slightly decreased to 7.3 and 23.4. Continue Trinsicon. 5. ID-on Vancomycin and Ceftriaxone, GPC on gram stain. Patient will need 6 weeks of IV antibiotic (end date 05/31) 6. Neurology-CVA. s/p leftcommon carotid arteriogram followed by complete revascularization of occluded M3 region of inferior division ofleft MCA complicated by dissection/pseudoaneurysm of left ICA-s/p placement of a 4.5 mm x 71mm pipeline flow diverter at site of focal dissection associated with a small pseudoaneurysm of distal cervical LT ICA.  DAPT (asa and plavix) recommended for 3-6 months but Dr. Corliss Skains to determine exact regimen and patient will need to follow up in the stroke clinic with Dr. Pearlean Brownie after discharge 7. GI-per speech pathology, regular;Thin liquid  Liquid Administration via: Cup;Straw Medication Administration: Whole meds with puree Supervision: Patient able to self feed  8. History of poly stubstance abuse-will need assistance with rehab 9. Remove EPW 10. Right DVT, groin hematoma-s/p IVC filter 03/26 11. Yeast infection-Diflucan for one dose and then Monistat  Donielle M ZimmermanPA-C 05/03/2019,7:52 AM     Chart reviewed, patient examined, agree with above.

## 2019-05-04 DIAGNOSIS — T826XXA Infection and inflammatory reaction due to cardiac valve prosthesis, initial encounter: Secondary | ICD-10-CM

## 2019-05-04 DIAGNOSIS — B258 Other cytomegaloviral diseases: Secondary | ICD-10-CM

## 2019-05-04 LAB — CBC
HCT: 24.7 % — ABNORMAL LOW (ref 36.0–46.0)
Hemoglobin: 7.7 g/dL — ABNORMAL LOW (ref 12.0–15.0)
MCH: 27.1 pg (ref 26.0–34.0)
MCHC: 31.2 g/dL (ref 30.0–36.0)
MCV: 87 fL (ref 80.0–100.0)
Platelets: 345 10*3/uL (ref 150–400)
RBC: 2.84 MIL/uL — ABNORMAL LOW (ref 3.87–5.11)
RDW: 18.1 % — ABNORMAL HIGH (ref 11.5–15.5)
WBC: 6 10*3/uL (ref 4.0–10.5)
nRBC: 0 % (ref 0.0–0.2)

## 2019-05-04 MED ORDER — OXYCODONE HCL 5 MG PO TABS
5.0000 mg | ORAL_TABLET | ORAL | Status: DC | PRN
  Administered 2019-05-04 (×3): 5 mg via ORAL
  Filled 2019-05-04 (×3): qty 1

## 2019-05-04 NOTE — Anesthesia Postprocedure Evaluation (Signed)
Anesthesia Post Note  Patient: Lauren Reyes  Procedure(s) Performed: AORTIC VALVE REPLACEMENT (AVR) using Edwards PERIMOUNT Magna Ease Pericardial Bioprosthesis - 29 MM Aortic Valve. (N/A Chest) REPAIR OF TRICUSPID (N/A Chest) TRANSESOPHAGEAL ECHOCARDIOGRAM (TEE) (N/A )     Patient location during evaluation: SICU Anesthesia Type: General Level of consciousness: sedated Pain management: pain level controlled Vital Signs Assessment: post-procedure vital signs reviewed and stable Respiratory status: patient remains intubated per anesthesia plan Cardiovascular status: stable Postop Assessment: no apparent nausea or vomiting Anesthetic complications: no    Last Vitals:  Vitals:   05/04/19 0836 05/04/19 1614  BP: 106/79 113/86  Pulse: (!) 112   Resp: 20 20  Temp: 36.6 C 37.9 C  SpO2: 99% 99%    Last Pain:  Vitals:   05/04/19 1617  TempSrc:   PainSc: 5                  Ahleah Simko

## 2019-05-04 NOTE — Op Note (Signed)
NAME: Lauren, Reyes [facility] MEDICAL RECORD UD:[id number] ACCOUNT [id number] DATE OF BIRTH:1990/02/06 FACILITY: [facility] LOCATION: [facility] PHYSICIAN:Alexandr Oehler Bari Atina Feeley, MD  OPERATIVE REPORT  DATE OF PROCEDURE:  04/28/2019  PREOPERATIVE DIAGNOSIS:  Aortic and tricuspid endocarditis with acute stroke.  POSTOPERATIVE DIAGNOSIS:  Aortic and tricuspid endocarditis with acute stroke.  SURGICAL PROCEDURE:  Aortic valve replacement with pericardial tissue valve, model 3300 TFX 19 mm Edwards [employer] serial [id number], and debridement and repair of tricuspid regurgitation.  SURGEON:  Sheliah Plane, MD  FIRST ASSISTANT:  Jari Favre, PA  BRIEF HISTORY:  The patient is a 29 year old female who presented to the hospital 04/01/2019 semicomatose with acute left middle cerebral artery stroke.  She was urgently intubated, underwent a neurointerventional radiology intervention for treatment of  her stroke.  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  The patient slowly recovered neurologically.  Was ventilator dependent for several days.  Ultimately, echocardiogram showed evidence  of severe aortic insufficiency and vegetations on the aortic and tricuspid valve.  CT scan showed multiple lung abscesses related to dissection on the tricuspid valve.  She grew methicillin-resistant staph.  Initially, the patient was treated medically  aggressively.  Her neurologic status slowly improved and after several weeks of antibiotics, continued to be on the verge of congestive heart failure.  After consultation with medical and rehab services and with the patient's family, we proceeded with  surgical treatment of her endocarditis.  She also had evidence of splenic infarcts.  Risks and options were discussed with the patient and her mother in detail and she signed informed consent.  DESCRIPTION OF PROCEDURE:  With central line in place and arterial line in  place, the patient underwent general endotracheal anesthesia by Dr. Maple Hudson.  A TEE probe was placed.  Her immediate preop transthoracic echo was interpreted as mild mitral  regurgitation, severe aortic regurgitation.  The transthoracic echo indicated areas of clot on the atrial wall.  TEE did not confirm this; though, the patient did have severe aortic insufficiency without definite root abscess.  Tricuspid regurgitation  was severe.  No definite vegetations were appreciated.  There was no clot in the right atrium.  There were no mitral valve vegetations.  Mild mitral regurgitation.  The chest and legs were prepped with Betadine and draped in the usual sterile manner.   Appropriate timeout was performed and then we proceeded with standard sternotomy.  Pericardium was opened.  The patient was systemically heparinized.  Ascending aorta was cannulated.  Dual right angle venous cannulas were placed in the superior vena cava  and inferior vena cava.  An aortic root vent was placed in the ascending aorta.  With the TEE showing no evidence of clot in the right atrium, we then placed a retrograde cardioplegic catheter in the coronary sinus.  The patient was placed on  cardiopulmonary bypass, 2.4 liters per minute per meter squared.  A right superior pulmonary vein vent was placed.  The patient's body temperature was cooled to 32 degrees.  Aortic crossclamp was applied and with the heart beating, antegrade cardioplegia  was administered until arrest occurred.  We then transitioned to retrograde cold blood potassium cardioplegia.  Myocardial septal temperatures were monitored.  With the heart arrested, I did a transverse aortotomy.  The patient had a very small body  surface area and a small aorta.  With the aorta opened, we had good visualization of what appeared to be a trileaflet aortic valve, but with the noncoronary  leaflet almost completely destroyed.  There was no evidence of root abscess.  The aortic valve  and  fragments of the leaflets were completely excised.  Portions of this specimen were sent to pathology and also to microbiology.  With the valve completely excised, we then sized the aortic root for a #19 pericardial tissue valve.  Without significant  root abscess, we decided not to use a homograft.  The patient's age being of childbearing age and social situation, it was not felt that long-term Coumadin was a viable alternative use of a mechanical valve.  We then used #2 Ti-Cron pledgeted sutures  with the pledgets on the ventricular surface circumferentially to secure the aortic valve in place.  With the small size of the entire aorta, we were still able to position the pericardial valve into the aortic annulus.  Care was taken to not interfere  with the right or left coronary ostium, which were both visualized.  The valve was then secured in place and seated well.  Then with felt strips and horizontal mattress 4-0 Prolene suture, we closed the aortotomy and replaced the aortic root vent  cardioplegia needle, which had been removed to give more working room as we replaced the valve.  We then secured the caval tapes.  Intermittently, cold blood retrograde cardioplegia had been administered with good return of blood through the coronary  ostia.  The right atrium was then opened and the tricuspid valve inspected.  There were no large vegetations were clots within the right atrium.  There was an area along the posterior commissure where some portal rupture and very small areas that  appeared to have had vegetations previously.  This abnormal part of the leaflets was debrided and also cultured.  We then placed 2 mattress sutures along the annulus at the posterior commissure, which compensated for the area of regurgitation.  With  passive filling of cold saline, this appeared satisfactory.  We then removed the aortic crossclamp while allowing the heart to fill with satisfaction that the tricuspid valve was  satisfactorily intact.  We then proceeded with closure of the right atrium  with a horizontal mattress running 4-0 Prolene suture.  The right heart was de-aired and the right atrium was de-aired and the closure completed.  The patient's body temperature rewarmed to 37 degrees.  She returned to a sinus rhythm, between 85 and 90  without evidence of heart block.  We had considered placement of epicardial permanent leads if necessary, but with preservation of sinus function, decided not to.  Atrial and ventricular pacing wires were applied.  Retrograde cardioplegic catheter was  removed.  The heart was allowed to passively fill and TEE showed good function of the aortic valve, tricuspid and mitral valves.  We removed the right superior pulmonary vein vent and with the body temperature rewarmed to 37 degrees and with patient on  low low-dose Levophed, she was then ventilated and weaned from cardiopulmonary bypass.  At the beginning of the case, the patient was noted to have moderate sized bilateral pleural effusions.  This fluid was removed, almost 2 liters from each side.  For  this reason, bilateral pleural chest tubes were placed, 2 pericardial drains were left in place.  Pericardium was loosely reapproximated.  Sternum was closed with #6 stainless steel wire.  Fascia closed with interrupted 0 Vicryl, running 3-0 Vicryl in  subcutaneous tissue, 4-0 subcuticular stitch in the skin.  Sponge and needle count was reported as correct.  The patient tolerated the  procedure without obvious complication.  At the completion of the procedure, TEE showed good functioning of aortic  prosthetic valve without perivalvular or central aortic insufficiency, trace mitral regurgitation and tricuspid regurgitation ____.  The total pump time was 115 minutes.  Crossclamp time was 150 minutes.  Pump time 199 minutes.  The patient did not  require any blood bank blood products during the operative procedure.  She was then transferred  to the surgical intensive care unit for further postoperative care.  JN/NUANCE  D:05/03/2019 T:05/04/2019 JOB:[id number]

## 2019-05-04 NOTE — Progress Notes (Signed)
CARDIAC REHAB PHASE I   PRE:  Rate/Rhythm: 126 ST  BP:  Supine:   Sitting: 106/79  Standing:    SaO2:   MODE:  Ambulation: 675 ft   POST:  Rate/Rhythm: 145 ST   108 resting   BP:  Supine: 117/93  Sitting:   Standing:    SaO2: 98%RA 0850-0915 Pt receptive to walking. Pt walked 675 ft on RA with rolling walker with steady gait. Tolerated well except HR to 145. Pt stated she could feel palpitations. HR down quickly with rest. To bed after walk. Motivated to get more walks today.   Lauren Nutting, RN BSN  05/04/2019 9:11 AM

## 2019-05-04 NOTE — Progress Notes (Addendum)
Subjective: No new complaints   Antibiotics:  Anti-infectives (From admission, onward)   Start     Dose/Rate Route Frequency Ordered Stop   05/03/19 1000  fluconazole (DIFLUCAN) tablet 150 mg     150 mg Oral  Once 05/03/19 0804 05/03/19 1243   04/28/19 0400  vancomycin (VANCOREADY) IVPB 1250 mg/250 mL  Status:  Discontinued     1,250 mg 166.7 mL/hr over 90 Minutes Intravenous To Surgery 04/27/19 1131 04/28/19 1451   04/28/19 0400  cefUROXime (ZINACEF) 1.5 g in sodium chloride 0.9 % 100 mL IVPB     1.5 g 200 mL/hr over 30 Minutes Intravenous To Surgery 04/27/19 1131 04/28/19 0819   04/28/19 0400  cefUROXime (ZINACEF) 750 mg in sodium chloride 0.9 % 100 mL IVPB     750 mg 200 mL/hr over 30 Minutes Intravenous To Surgery 04/27/19 1131 04/28/19 1313   04/15/19 0900  vancomycin (VANCOREADY) IVPB 750 mg/150 mL     750 mg 150 mL/hr over 60 Minutes Intravenous Every 8 hours 04/15/19 0821     04/13/19 1400  vancomycin (VANCOCIN) IVPB 1000 mg/200 mL premix  Status:  Discontinued     1,000 mg 200 mL/hr over 60 Minutes Intravenous Every 8 hours 04/13/19 0839 04/15/19 0821   04/12/19 2200  vancomycin (VANCOREADY) IVPB 750 mg/150 mL  Status:  Discontinued     750 mg 150 mL/hr over 60 Minutes Intravenous Every 8 hours 04/12/19 1500 04/13/19 0839   04/10/19 1336  vancomycin (VANCOCIN) IVPB 1000 mg/200 mL premix  Status:  Discontinued     1,000 mg 200 mL/hr over 60 Minutes Intravenous Every 8 hours 04/10/19 1320 04/12/19 1500   04/08/19 1200  cefTRIAXone (ROCEPHIN) 2 g in sodium chloride 0.9 % 100 mL IVPB     2 g 200 mL/hr over 30 Minutes Intravenous Every 12 hours 04/08/19 1013 06/09/19 2359   04/08/19 0530  vancomycin (VANCOCIN) IVPB 1000 mg/200 mL premix  Status:  Discontinued     1,000 mg 200 mL/hr over 60 Minutes Intravenous Every 12 hours 04/07/19 1541 04/10/19 1320   04/07/19 1900  Ampicillin-Sulbactam (UNASYN) 3 g in sodium chloride 0.9 % 100 mL IVPB  Status:  Discontinued       3 g 200 mL/hr over 30 Minutes Intravenous Every 6 hours 04/07/19 1457 04/08/19 1013   04/07/19 1730  vancomycin (VANCOREADY) IVPB 1500 mg/300 mL     1,500 mg 150 mL/hr over 120 Minutes Intravenous  Once 04/07/19 1541 04/07/19 1937   04/04/19 1400  ceFAZolin (ANCEF) IVPB 2g/100 mL premix  Status:  Discontinued     2 g 200 mL/hr over 30 Minutes Intravenous Every 8 hours 04/04/19 1119 04/07/19 1457   04/04/19 0400  Vancomycin (VANCOCIN) 1,250 mg in sodium chloride 0.9 % 250 mL IVPB  Status:  Discontinued     1,250 mg 166.7 mL/hr over 90 Minutes Intravenous Every 12 hours 04/03/19 1635 04/04/19 1159   04/02/19 1800  ceFEPIme (MAXIPIME) 2 g in sodium chloride 0.9 % 100 mL IVPB  Status:  Discontinued     2 g 200 mL/hr over 30 Minutes Intravenous Every 8 hours 04/02/19 1218 04/04/19 1127   04/02/19 1600  vancomycin (VANCOCIN) IVPB 1000 mg/200 mL premix  Status:  Discontinued     1,000 mg 200 mL/hr over 60 Minutes Intravenous Every 12 hours 04/02/19 0100 04/03/19 1635   04/02/19 0200  piperacillin-tazobactam (ZOSYN) IVPB 3.375 g  Status:  Discontinued     3.375  g 12.5 mL/hr over 240 Minutes Intravenous Every 8 hours 04/02/19 0059 04/02/19 1219   04/02/19 0200  vancomycin (VANCOCIN) 1,500 mg in sodium chloride 0.9 % 500 mL IVPB     1,500 mg 250 mL/hr over 120 Minutes Intravenous  Once 04/02/19 0100 04/02/19 0923   04/01/19 2040  ceFAZolin (ANCEF) 2-4 GM/100ML-% IVPB    Note to Pharmacy: Channing Mutters   : cabinet override      04/01/19 2040 04/02/19 0844      Medications: Scheduled Meds: . aspirin EC  81 mg Oral Daily   Or  . aspirin  81 mg Per Tube Daily  . bisacodyl  10 mg Oral Daily   Or  . bisacodyl  10 mg Rectal Daily  . busPIRone  7.5 mg Oral BID  . Chlorhexidine Gluconate Cloth  6 each Topical Daily  . clopidogrel  75 mg Oral Daily  . clotrimazole  1 Applicatorful Vaginal QHS  . [facility] Patient [facility] Family Education   Does not apply Once  . docusate sodium   200 mg Oral Daily  . enoxaparin (LOVENOX) injection  30 mg Subcutaneous QHS  . escitalopram  10 mg Oral Daily  . feeding supplement (ENSURE ENLIVE)  237 mL Oral BID BM  . ferrous fumarate-b12-vitamic C-folic acid  1 capsule Oral BID PC  . furosemide  20 mg Oral Daily  . metoprolol tartrate  12.5 mg Oral BID   Or  . metoprolol tartrate  12.5 mg Per Tube BID  . pantoprazole  40 mg Oral Daily  . pneumococcal 23 valent vaccine  0.5 mL Intramuscular Tomorrow-1000  . potassium chloride  20 mEq Oral Daily  . QUEtiapine  25 mg Oral QHS  . sodium chloride flush  3 mL Intravenous Q12H   Continuous Infusions: . sodium chloride    . sodium chloride    . cefTRIAXone (ROCEPHIN)  IV 2 g (05/04/19 0849)  . lactated ringers Stopped (04/30/19 2141)  . vancomycin 750 mg (05/04/19 1134)   PRN Meds:.sodium chloride, LORazepam, ondansetron (ZOFRAN) IV, oxyCODONE, polyethylene glycol, sodium chloride flush    Objective: Weight change: 0.363 kg  Intake/Output Summary (Last 24 hours) at 05/04/2019 1228 Last data filed at 05/04/2019 1135 Gross per 24 hour  Intake 120 ml  Output 1 ml  Net 119 ml   Blood pressure 106/79, pulse (!) 112, temperature 97.8 F (36.6 C), temperature source Oral, resp. rate 20, height 5\' 6"  (1.676 m), weight 76.2 kg, last menstrual period 04/05/2019, SpO2 99 %. Temp:  [97.8 F (36.6 C)-100 F (37.8 C)] 97.8 F (36.6 C) (04/26 0836) Pulse Rate:  [96-112] 112 (04/26 0836) Resp:  [18-27] 20 (04/26 0836) BP: (94-121)/(61-95) 106/79 (04/26 0836) SpO2:  [96 %-100 %] 99 % (04/26 0836) Weight:  [76.2 kg] 76.2 kg (04/26 0454)  Physical Exam: General: Alert and awake, oriented x3, not in any acute distress. HEENT: anicteric sclera, EOMI CVS regular rate, normal  Chest: , no wheezing, no respiratory distress Abdomen: soft non-distended,  Extremities: no edema or deformity noted bilaterally Skin: no rashes Neuro: nonfocal  CBC:    BMET Recent Labs    05/02/19 0340  05/03/19 0502  NA 137 137  K 3.2* 3.8  CL 98 102  CO2 32 28  GLUCOSE 92 95  BUN 16 10  CREATININE 0.65 0.52  CALCIUM 8.3* 8.5*     Liver Panel  Recent Labs    05/02/19 0340  PROT 5.0*  ALBUMIN 2.2*  AST 21  ALT 20  ALKPHOS 74  BILITOT 0.7       Sedimentation Rate No results for input(s): ESRSEDRATE in the last 72 hours. C-Reactive Protein No results for input(s): CRP in the last 72 hours.  Micro Results: Recent Results (from the past 720 hour(s))  Surgical PCR screen     Status: None   Collection Time: 04/24/19  6:56 PM   Specimen: Nasal Mucosa; Nasal Swab  Result Value Ref Range Status   MRSA, PCR NEGATIVE NEGATIVE Final   Staphylococcus aureus NEGATIVE NEGATIVE Final    Comment: (NOTE) The Xpert SA Assay (FDA approved for NASAL specimens in patients 47 years of age and older), is one component of a comprehensive surveillance program. It is not intended to diagnose infection nor to guide or monitor treatment. Performed at [facility], [address], [place], [place] [address]   MRSA PCR Screening     Status: None   Collection Time: 04/28/19  4:13 AM   Specimen: Nasopharyngeal  Result Value Ref Range Status   MRSA by PCR NEGATIVE NEGATIVE Final    Comment:        The GeneXpert MRSA Assay (FDA approved for NASAL specimens only), is one component of a comprehensive MRSA colonization surveillance program. It is not intended to diagnose MRSA infection nor to guide or monitor treatment for MRSA infections. Performed at [facility], [address] [place], [place] [address]   Aerobic/Anaerobic Culture (surgical/deep wound)     Status: None   Collection Time: 04/28/19  9:06 AM   Specimen: PATH Cytology Misc. fluid; Body Fluid  Result Value Ref Range Status   Specimen Description WOUND PERICARDIAL FLUID  Final   Special Requests SPEC A SENT ON SWAB  Final   Gram Stain   Final    RARE WBC PRESENT, PREDOMINANTLY MONONUCLEAR NO ORGANISMS  SEEN    Culture   Final    No growth aerobically or anaerobically. Performed at [facility], [address] [place], [place] [address]    Report Status 05/03/2019 FINAL  Final  Aerobic/Anaerobic Culture (surgical/deep wound)     Status: None   Collection Time: 04/28/19 10:19 AM   Specimen: Aortic Valve Leaflets; Tissue  Result Value Ref Range Status   Specimen Description AORTIC VALVE TISSUE LEAFLETS  Final   Special Requests SPECIMEN B  Final   Gram Stain   Final    FEW WBC PRESENT,BOTH PMN AND MONONUCLEAR RARE GRAM POSITIVE COCCI    Culture   Final    No growth aerobically or anaerobically. Performed at [facility], [address], [place], [place] [address]    Report Status 05/03/2019 FINAL  Final  Aerobic/Anaerobic Culture (surgical/deep wound)     Status: None   Collection Time: 04/28/19 11:54 AM   Specimen: Heart Valve Leaflet(s); Tissue  Result Value Ref Range Status   Specimen Description VALVE TRICUSPID  Final   Special Requests SPECIMEN C  Final   Gram Stain NO WBC SEEN NO ORGANISMS SEEN   Final   Culture   Final    No growth aerobically or anaerobically. Performed at [facility], [address], [place], [place] [address]    Report Status 05/03/2019 FINAL  Final    Studies/Results: No results found.    Assessment/Plan:  INTERVAL HISTORY: CMV viral load peripheral blood came back low count   Active Problems:   Acute ischemic left MCA stroke (HCC)   Middle cerebral artery  embolism, left   Acute respiratory failure (HCC)   DVT (deep venous thrombosis) (HCC)   Encounter for central line placement   Endotracheal tube present   Cerebrovascular accident (CVA) due to embolism of precerebral artery (HCC)   Infective endocarditis   Normocytic anemia   Polysubstance abuse (HCC)   IVDU (intravenous drug user)   Systolic congestive heart failure, NYHA class 1 (HCC)   Tachypnea   Endocarditis    Lauren Reyes is a 29 y.o.  female with she of IV drug use status post aortic valve replacement for endocarditis which was culture negative.  She has been currently on vancomycin and ceftriaxone pathology valve had shown to feel cells with findings consistent with CMV.    #1 aortic valve endocarditis:  Plan on continuing 6 weeks of IV vancomycin and ceftriaxone with end date Jun 08, 2019  I will see if we can send the pathology tissue for PCR [facility].  2.  CMV suspected in the endothelial cells on tissue sent from valve there does not appear to be any evidence of widespread CMV infection.  I do not think that this is a significant finding I do not think she needs therapy for CMV.  We will arrange HSFU for her in our clinic which is visible out of her clinic at [address] [facility]   Farrin Jemya Depierro has an appointment on June 14th 2021 with Dr. Luciana Axe at 930AM  The [facility] is located in the [facility] at  [address] in [place].  [address], which is located to the left of the elevators.  Phone: [phone]  Fax: [phone]  [email or web address]  We will sign off for now please call with further questions.   LOS: 33 days   Acey Lav 05/04/2019, 12:28 PM

## 2019-05-04 NOTE — Progress Notes (Addendum)
[address]       [place] [address]             [phone]      6 Days Post-Op Procedure(s) (LRB): AORTIC VALVE REPLACEMENT (AVR) using Edwards PERIMOUNT Magna Ease Pericardial Bioprosthesis - 29 MM Aortic Valve. (N/A) REPAIR OF TRICUSPID (N/A) TRANSESOPHAGEAL ECHOCARDIOGRAM (TEE) (N/A) Subjective: Feels okay this morning. States that her yeast infection is "annoying". Pain is well controlled.   Objective: Vital signs in last 24 hours: Temp:  [98.6 F (37 C)-100 F (37.8 C)] 99 F (37.2 C) (04/26 0454) Pulse Rate:  [96-104] 104 (04/26 0454) Cardiac Rhythm: Sinus tachycardia (04/25 2026) Resp:  [18-27] 20 (04/26 0454) BP: (94-121)/(61-95) 105/88 (04/26 0454) SpO2:  [96 %-100 %] 100 % (04/26 0454) Weight:  [76.2 kg] 76.2 kg (04/26 0454)     Intake/Output from previous day: No intake/output data recorded. Intake/Output this shift: No intake/output data recorded.  General appearance: alert, cooperative and no distress Heart: sinus tachycardia Lungs: clear to auscultation bilaterally Abdomen: soft, non-tender; bowel sounds normal; no masses,  no organomegaly Extremities: extremities normal, atraumatic, no cyanosis or edema Wound: clean and dry  Lab Results: Recent Labs    05/02/19 0340 05/04/19 0400  WBC 6.1 6.0  HGB 7.3* 7.7*  HCT 23.4* 24.7*  PLT 234 345   BMET:  Recent Labs    05/02/19 0340 05/03/19 0502  NA 137 137  K 3.2* 3.8  CL 98 102  CO2 32 28  GLUCOSE 92 95  BUN 16 10  CREATININE 0.65 0.52  CALCIUM 8.3* 8.5*    PT/INR: No results for input(s): LABPROT, INR in the last 72 hours. ABG    Component Value Date/Time   PHART 7.365 04/28/2019 2205   HCO3 24.5 04/28/2019 2205   TCO2 26 04/28/2019 2205   ACIDBASEDEF 1.0 04/28/2019 2205   O2SAT 97.0 04/28/2019 2205   CBG (last 3)  Recent Labs    05/01/19 2149 05/02/19 0618 05/02/19 2130  GLUCAP 105* 92 102*    Assessment/Plan: S/P Procedure(s) (LRB): AORTIC  VALVE REPLACEMENT (AVR) using Edwards PERIMOUNT Magna Ease Pericardial Bioprosthesis - 29 MM Aortic Valve. (N/A) REPAIR OF TRICUSPID (N/A) TRANSESOPHAGEAL ECHOCARDIOGRAM (TEE) (N/A)  1. CV - Sinus tachycardia. On Lopressor 12.5 mg bid-BP too low to titrate.  2.  Pulmonary - On room air. CXR stable.  3. Volume Overload - continue oral lasix. Remains 3kg over baseline.  4.  Expected post op acute blood loss anemia - H and H this 7.7/24.7 Continue Trinsicon. 5. ID-on Vancomycin and Ceftriaxone, GPC on gram stain. Patient will need 6 weeks of IV antibiotic (end date 05/31) 6. Neurology-CVA. s/p leftcommon carotid arteriogram followed by complete revascularization of occluded M3 region of inferior division ofleft MCA complicated by dissection/pseudoaneurysm of left ICA-s/p placement of a 4.5 mm x 73mm pipeline flow diverter at site of focal dissection associated with a small pseudoaneurysm of distal cervical LT ICA.  DAPT (asa and plavix) recommended for 3-6 months but Dr. Estanislado Pandy to determine exact regimen and patient will need to follow up in the stroke clinic with Dr. Leonie Man after discharge. 7. GI-per speech pathology, regular;Thin liquid Liquid Administration via: Cup;Straw Medication Administration: Whole meds with puree Supervision: Patient able to self feed 8. History of poly stubstance abuse-will need assistance with rehab. Will decrease oxycodone dose since pain has been well controlled.  9. Right DVT, groin hematoma-s/p IVC filter 03/26 10. Yeast infection-Diflucan for one dose on 4/25  and then Monistat  Plan: Continue medical care. Patient will need to remain in the hospital until 5/31 getting her IV antibiotics. Continue ambulation TID. Encouraged incentive spirometer. Weaning narcotics.    LOS: 33 days    [name] 05/04/2019   Chart reviewed, patient examined, agree with above.

## 2019-05-04 NOTE — Progress Notes (Signed)
[facility] Progress Note  Patient: Lauren Reyes    TDV:[id number]  DOA: 04/01/2019     Date of Service: the patient was seen and examined on 05/04/2019  Chief Complaint  Patient presents with  . Code Stroke   Brief hospital course: 29 year old female with past medical history with substance abuse with heroin, crystal meth and cocaine as well as smoking presents to the ER with aphasia and right-sided weakness and code stroke.  Admitted to neurology ICU.  Patient was intubated underwent thrombectomy for left MCA M3 occlusion and recanalization.  This was complicated by dissection and pseudoaneurysm of the left ICA SP stent placement. Extubated on April 04, 2019.  Also had a DVT but treated with IVC filter.  TEE showed infective endocarditis. SP bioprosthetic aortic valve replacement and tricuspid valve repair. Currently further plan is continue IV antibiotics for endocarditis.  Assessment and Plan: 1.  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 SP aortic valve replacement with bioprosthetic valve and tricuspid repair. MSSA in sputum. CMV infection Initially presented with code stroke, intubated and admitted to the neuro ICU. Tracheal aspirate grew MSSA. Imaging showed large septic emboli in left hemisphere of the brain, lung, spleen as well as the neck and renal infarcts. Initially patient was on IV vancomycin and Unasyn. So far tissue culture with no growth to date. Remains on IV vancomycin and IV ceftriaxone. 6 weeks of antibiotics post surgery through Jun 08, 2019. On the path report staining looks like CMV in some areas. PCR DNA is ordered currently pending. PICC line inserted. Patient will remain in a supervised setting for completion of the antibiotics.  2.  Aortic valve endocarditis SP aortic valve replacement 04/28/2019 and tricuspid repair Cardiothoracic surgery consult appreciated.  Admitted in the ICU postprocedure. Currently  extubated on room air. Management per cardiothoracic surgery.  3.  Acute CVA of the MCA territory.  Embolic in nature. TPA was deferred as concern for septic emboli. SP left common carotid arteriogram with complete revascularization of the occluded M3. Complicated by dissection and pseudoaneurysm of the left ICA SP placement of 4.5 mm x 25 mm pipeline flow diverter stent. Initially was on aspirin and Brilinta.  Brilinta was held for cardiothoracic surgery. Patient has been off Brilinta but on aspirin for cardiothoracic surgery. Patient has intracranial stenting therefore will require dual antiplatelet therapy with aspirin and Plavix per neurology. Continue this for 3 to 6 months. Patient will follow up with Dr. Corliss Skains and decide on exact regimen. Patient will also require follow-up with neurology.  4.  Right common femoral DVT Right groin hematoma IVC filter placement April 03, 2019. During the hospitalization patient was found to have right common femoral DVT. Patient also was found to have large right groin hematoma post vascular intervention Decision was made to place IVC filter placement since the patient was felt not a candidate for therapeutic anticoagulation given her septic emboli, dissection as well as hematoma.  5.  Acute blood loss anemia. Iron deficiency anemia. B12 deficiency. Folate deficiency. Continue supplementation. H&H relatively stable. No active bleeding identified. No GI bleed. Transfer to hemoglobin less than 7 or hemodynamic instability.  6.  Polysubstance abuse. UDS positive for THC, amphetamine admission. Suspected IV drug use as well with needle marks. Currently on oral pain medication. Out of the window for any withdrawals.  7.  Anxiety. Currently on BuSpar, Lexapro, Seroquel.  May not require Seroquel at discharge. As needed Ativan.  8.  Hepatitis C antibody positive. Negative viral load.  HIV screen negative. RPR negative. Will require  outpatient follow-up and therapy.  9.  Pain control. On as needed oxycodone.  We will start working on a discharge plan.  10.  Sinus tachycardia. On Lopressor 12.5 mg twice daily.  Hold for hypotension.  Parameters added.  11.  Constipation. Resolved  Bowel regimen adjusted.  12.  Hypokalemia. Replaced  We will monitor.  13. Yeast infection Clotrimazole for 7 days.   14. HFpEF On lasix monitor, reduced the dose from 40 mg daily to 20 mg daily due to tachycardia and hypotension   Body mass index is 27.1 kg/m.  Nutrition Problem: Increased nutrient needs Etiology: acute illness Interventions: Interventions: Ensure Enlive (each supplement provides 350kcal and 20 grams of protein)  Diet: Regular diet DVT Prophylaxis: Subcutaneous Heparin    Advance goals of care discussion: Full code  Family Communication: no family was present at bedside, at the time of interview.   Disposition:  Status is: Inpatient  Remains inpatient appropriate because:Unsafe d/c plan and Inpatient level of care appropriate due to severity of illness   Dispo: The patient is from: Home              Anticipated d/c is to: to be detrermined               Anticipated d/c date is: > 3 days              Patient currently is not medically stable to d/c.  Subjective: Remains stable.  No nausea no vomiting.  No fever no chills.  No chest pain.  Breathing okay.  Physical Exam: General: Appear in mild distress, no Rash; Oral Mucosa Clear, moist. no Abnormal Neck Mass Or lumps, Conjunctiva normal  Cardiovascular: S1 and S2 Present, no Murmur, Respiratory: normal respiratory effort, Bilateral Air entry present and Clear to Auscultation, no Crackles, no wheezes Abdomen: Bowel Sound present, Soft and no tenderness, no hernia Extremities: no Pedal edema, no calf tenderness Neurology: alert and oriented to time, place, and person affect appropriate  Vitals:   05/03/19 2021 05/04/19 0454 05/04/19 0836 05/04/19  1614  BP: (!) 121/95 105/88 106/79 113/86  Pulse: (!) 101 (!) 104 (!) 112   Resp: 18 20 20 20   Temp: 100 F (37.8 C) 99 F (37.2 C) 97.8 F (36.6 C) 100.3 F (37.9 C)  TempSrc: Oral Oral Oral Oral  SpO2: 100% 100% 99% 99%  Weight:  76.2 kg    Height:        Intake/Output Summary (Last 24 hours) at 05/04/2019 1842 Last data filed at 05/04/2019 1135 Gross per 24 hour  Intake 120 ml  Output 1 ml  Net 119 ml   Filed Weights   05/02/19 0342 05/03/19 0231 05/04/19 0454  Weight: 73.7 kg 75.8 kg 76.2 kg    Data Reviewed: I have personally reviewed and interpreted daily labs, tele strips, imagings as discussed above. I reviewed all nursing notes, pharmacy notes, vitals, pertinent old records I have discussed plan of care as described above with RN and patient/family.  CBC: Recent Labs  Lab 04/29/19 1700 04/30/19 0400 05/01/19 0412 05/02/19 0340 05/04/19 0400  WBC 10.7* 8.6 7.6 6.1 6.0  HGB 8.8* 8.1* 7.9* 7.3* 7.7*  HCT 28.5* 26.0* 25.1* 23.4* 24.7*  MCV 89.3 88.1 87.2 86.7 87.0  PLT 172 154 202 234 345   Basic Metabolic Panel: Recent Labs  Lab 04/28/19 2155 04/28/19 2205 04/29/19 0500 04/29/19 0500 04/29/19 1700 04/30/19 0400 05/01/19 0412 05/02/19 0340 05/03/19  0502  NA 138   < > 137   < > 133* 132* 136 137 137  K 4.1   < > 4.3   < > 4.3 3.9 3.0* 3.2* 3.8  CL 109   < > 108   < > 102 100 102 98 102  CO2 19*   < > 20*   < > 22 21* 27 32 28  GLUCOSE 125*   < > 109*   < > 114* 102* 110* 92 95  BUN 12   < > 11   < > 10 12 9 16 10   CREATININE 0.58   < > 0.49   < > 0.56 0.58 0.63 0.65 0.52  CALCIUM 7.8*   < > 8.0*   < > 8.6* 8.4* 8.2* 8.3* 8.5*  MG 2.4  --  2.0  --  1.9  --   --  1.6* 1.9   < > = values in this interval not displayed.    Studies: No results found.  Scheduled Meds: . aspirin EC  81 mg Oral Daily   Or  . aspirin  81 mg Per Tube Daily  . bisacodyl  10 mg Oral Daily   Or  . bisacodyl  10 mg Rectal Daily  . busPIRone  7.5 mg Oral BID  .  Chlorhexidine Gluconate Cloth  6 each Topical Daily  . clopidogrel  75 mg Oral Daily  . clotrimazole  1 Applicatorful Vaginal QHS  . [place] [facility], Patient & Family Education   Does not apply Once  . docusate sodium  200 mg Oral Daily  . enoxaparin (LOVENOX) injection  30 mg Subcutaneous QHS  . escitalopram  10 mg Oral Daily  . feeding supplement (ENSURE ENLIVE)  237 mL Oral BID BM  . ferrous [id number] C-folic acid  1 capsule Oral BID PC  . furosemide  20 mg Oral Daily  . metoprolol tartrate  12.5 mg Oral BID   Or  . metoprolol tartrate  12.5 mg Per Tube BID  . pantoprazole  40 mg Oral Daily  . pneumococcal 23 valent vaccine  0.5 mL Intramuscular Tomorrow-1000  . potassium chloride  20 mEq Oral Daily  . QUEtiapine  25 mg Oral QHS  . sodium chloride flush  3 mL Intravenous Q12H   Continuous Infusions: . sodium chloride    . sodium chloride    . cefTRIAXone (ROCEPHIN)  IV 2 g (05/04/19 0849)  . lactated ringers Stopped (04/30/19 2141)  . vancomycin 750 mg (05/04/19 1134)   PRN Meds: sodium chloride, LORazepam, ondansetron (ZOFRAN) IV, oxyCODONE, polyethylene glycol, sodium chloride flush  Time spent: 35 minutes  Author: Berle Mull, MD [facility] 05/04/2019 6:42 PM  To reach On-call, see care teams to locate the attending and reach out to them via [email or web address]. If 7PM-7AM, please contact night-coverage If you still have difficulty reaching the attending provider, please page the [facility] (Director on Call) for [facility] on amion for assistance.

## 2019-05-05 LAB — CBC
HCT: 25.1 % — ABNORMAL LOW (ref 36.0–46.0)
Hemoglobin: 7.6 g/dL — ABNORMAL LOW (ref 12.0–15.0)
MCH: 26.6 pg (ref 26.0–34.0)
MCHC: 30.3 g/dL (ref 30.0–36.0)
MCV: 87.8 fL (ref 80.0–100.0)
Platelets: 376 10*3/uL (ref 150–400)
RBC: 2.86 MIL/uL — ABNORMAL LOW (ref 3.87–5.11)
RDW: 18.3 % — ABNORMAL HIGH (ref 11.5–15.5)
WBC: 5.2 10*3/uL (ref 4.0–10.5)
nRBC: 0 % (ref 0.0–0.2)

## 2019-05-05 LAB — BASIC METABOLIC PANEL
Anion gap: 7 (ref 5–15)
BUN: 8 mg/dL (ref 6–20)
CO2: 24 mmol/L (ref 22–32)
Calcium: 8.5 mg/dL — ABNORMAL LOW (ref 8.9–10.3)
Chloride: 104 mmol/L (ref 98–111)
Creatinine, Ser: 0.69 mg/dL (ref 0.44–1.00)
GFR calc Af Amer: >60 mL/min (ref >60)
GFR calc non Af Amer: >60 mL/min (ref >60)
Glucose, Bld: 100 mg/dL — ABNORMAL HIGH (ref 70–99)
Potassium: 3.9 mmol/L (ref 3.5–5.1)
Sodium: 135 mmol/L (ref 135–145)

## 2019-05-05 MED ORDER — OXYCODONE HCL 5 MG PO TABS
5.0000 mg | ORAL_TABLET | Freq: Four times a day (QID) | ORAL | Status: DC | PRN
  Administered 2019-05-05 – 2019-05-06 (×4): 5 mg via ORAL
  Filled 2019-05-05 (×4): qty 1

## 2019-05-05 NOTE — Progress Notes (Signed)
PROGRESS NOTE    Lauren Reyes  KDX:[id number] DOB: Mar 30, 1990 DOA: 04/01/2019 PCP: Patient, No Pcp Per    Brief Narrative:  29 year old female with past medical history with substance abuse with heroin, crystal meth and cocaine as well as smoking presents to the ER with aphasia and right-sided weakness and code stroke.  Admitted to neurology ICU.  Patient was intubated underwent thrombectomy for left MCA M3 occlusion and recanalization.  This was complicated by dissection and pseudoaneurysm of the left ICA SP stent placement. Extubated on April 04, 2019.  Also had a DVT but treated with IVC filter.  TEE showed infective endocarditis. SP bioprosthetic aortic valve replacement and tricuspid valve repair on 4/20 Currently further plan is continue IV antibiotics for endocarditis until 5/31   Assessment & Plan:   Active Problems:   Acute ischemic left MCA stroke ([name])   Middle cerebral artery embolism, left   Acute respiratory failure (HCC)   DVT (deep venous thrombosis) ([name])   Encounter for central line placement   Endotracheal tube present   Cerebrovascular accident (CVA) due to embolism of precerebral artery (HCC)   Infective endocarditis   Normocytic anemia   Polysubstance abuse ([name])   IVDU (intravenous drug user)   Systolic congestive heart failure, NYHA class 1 (HCC)   Tachypnea   Endocarditis  1.  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 Severe AR. SP aortic valve replacement with bioprosthetic valve and tricuspid repair. MSSA in sputum.  Suspected CMV infection Initially presented with code stroke, intubated and admitted to the neuro ICU. Tracheal aspirate grew MSSA. So far tissue culture with no growth to date. Remains on IV vancomycin and IV ceftriaxone. 6 weeks of antibiotics post surgery through Jun 08, 2019. On the path report staining looks like CMV in some areas.  Followed by ID. PICC line inserted. Patient will remain in a supervised  setting for completion of the antibiotics.  2.  Aortic valve endocarditis. SP aortic valve replacement 04/28/2019 and tricuspid repair Cardiothoracic surgery following.  3.  Acute CVA of the MCA territory.  Embolic in nature. TPA was deferred as concern for septic emboli. SP left common carotid arteriogram with complete revascularization of the occluded M3. Complicated by dissection and pseudoaneurysm of the left ICA S/P placement of 4.5 mm x 25 mm pipeline flow diverter stent. Initially was on aspirin and Brilinta.  Brilinta was held for cardiothoracic surgery. Patient has been off [place] but on aspirin for cardiothoracic surgery. Patient has intracranial stenting therefore will require dual antiplatelet therapy with aspirin and Plavix per neurology. Continue this for 3 to 6 months. Patient will follow up with Dr. Estanislado Pandy and decide on exact regimen. Patient will also require follow-up with neurology.  4.  Right common femoral DVT, Right groin hematoma, IVC filter placement April 03, 2019. During the hospitalization patient was found to have right common femoral DVT. Patient also was found to have large right groin hematoma post vascular intervention Decision was made to place IVC filter placement since the patient was felt not a candidate for therapeutic anticoagulation given her septic emboli, dissection as well as hematoma.  5.  Acute blood loss anemia. Iron deficiency anemia. B12 deficiency. Folate deficiency. Continue supplementation. H&H relatively stable. No active bleeding identified. No GI bleed. Transfer to hemoglobin less than 7 or hemodynamic instability.  6.  Polysubstance abuse. UDS positive for THC, amphetamine admission. Suspected IV drug use as well with needle marks. Currently on oral pain medication. Out of the window for any  withdrawals.  7.  Anxiety. Currently on BuSpar, Lexapro, Seroquel.  May not require Seroquel at discharge. As needed  Ativan.  8.  Hepatitis C antibody positive. Negative viral load. HIV screen negative. RPR negative. Will require outpatient follow-up and therapy.  9.  Pain control. On as needed oxycodone.  We will start working on a discharge plan.   DVT prophylaxis: Heparin subcu Code Status: Full code Family Communication: None Disposition Plan: Status is: Inpatient  Remains inpatient appropriate because:IV treatments appropriate due to intensity of illness or inability to take PO   Dispo: The patient is from: Home              Anticipated d/c is to: Home              Anticipated d/c date is: > 3 days              Patient currently is not medically stable to d/c.          Consultants:   CT surgery  Infectious disease  Procedures:   Aortic valve replacement, 4/20  Antimicrobials:  Anti-infectives (From admission, onward)   Start     Dose/Rate Route Frequency Ordered Stop   05/03/19 1000  fluconazole (DIFLUCAN) tablet 150 mg     150 mg Oral  Once 05/03/19 0804 05/03/19 1243   04/28/19 0400  vancomycin (VANCOREADY) IVPB 1250 mg/250 mL  Status:  Discontinued     1,250 mg 166.7 mL/hr over 90 Minutes Intravenous To Surgery 04/27/19 1131 04/28/19 1451   04/28/19 0400  cefUROXime (ZINACEF) 1.5 g in sodium chloride 0.9 % 100 mL IVPB     1.5 g 200 mL/hr over 30 Minutes Intravenous To Surgery 04/27/19 1131 04/28/19 0819   04/28/19 0400  cefUROXime (ZINACEF) 750 mg in sodium chloride 0.9 % 100 mL IVPB     750 mg 200 mL/hr over 30 Minutes Intravenous To Surgery 04/27/19 1131 04/28/19 1313   04/15/19 0900  vancomycin (VANCOREADY) IVPB 750 mg/150 mL     750 mg 150 mL/hr over 60 Minutes Intravenous Every 8 hours 04/15/19 0821     04/13/19 1400  vancomycin (VANCOCIN) IVPB 1000 mg/200 mL premix  Status:  Discontinued     1,000 mg 200 mL/hr over 60 Minutes Intravenous Every 8 hours 04/13/19 0839 04/15/19 0821   04/12/19 2200  vancomycin (VANCOREADY) IVPB 750 mg/150 mL  Status:   Discontinued     750 mg 150 mL/hr over 60 Minutes Intravenous Every 8 hours 04/12/19 1500 04/13/19 0839   04/10/19 1336  vancomycin (VANCOCIN) IVPB 1000 mg/200 mL premix  Status:  Discontinued     1,000 mg 200 mL/hr over 60 Minutes Intravenous Every 8 hours 04/10/19 1320 04/12/19 1500   04/08/19 1200  cefTRIAXone (ROCEPHIN) 2 g in sodium chloride 0.9 % 100 mL IVPB     2 g 200 mL/hr over 30 Minutes Intravenous Every 12 hours 04/08/19 1013 06/09/19 2359   04/08/19 0530  vancomycin (VANCOCIN) IVPB 1000 mg/200 mL premix  Status:  Discontinued     1,000 mg 200 mL/hr over 60 Minutes Intravenous Every 12 hours 04/07/19 1541 04/10/19 1320   04/07/19 1900  Ampicillin-Sulbactam (UNASYN) 3 g in sodium chloride 0.9 % 100 mL IVPB  Status:  Discontinued     3 g 200 mL/hr over 30 Minutes Intravenous Every 6 hours 04/07/19 1457 04/08/19 1013   04/07/19 1730  vancomycin (VANCOREADY) IVPB 1500 mg/300 mL     1,500 mg 150 mL/hr over 120 Minutes  Intravenous  Once 04/07/19 1541 04/07/19 1937   04/04/19 1400  ceFAZolin (ANCEF) IVPB 2g/100 mL premix  Status:  Discontinued     2 g 200 mL/hr over 30 Minutes Intravenous Every 8 hours 04/04/19 1119 04/07/19 1457   04/04/19 0400  Vancomycin (VANCOCIN) 1,250 mg in sodium chloride 0.9 % 250 mL IVPB  Status:  Discontinued     1,250 mg 166.7 mL/hr over 90 Minutes Intravenous Every 12 hours 04/03/19 1635 04/04/19 1159   04/02/19 1800  ceFEPIme (MAXIPIME) 2 g in sodium chloride 0.9 % 100 mL IVPB  Status:  Discontinued     2 g 200 mL/hr over 30 Minutes Intravenous Every 8 hours 04/02/19 1218 04/04/19 1127   04/02/19 1600  vancomycin (VANCOCIN) IVPB 1000 mg/200 mL premix  Status:  Discontinued     1,000 mg 200 mL/hr over 60 Minutes Intravenous Every 12 hours 04/02/19 0100 04/03/19 1635   04/02/19 0200  piperacillin-tazobactam (ZOSYN) IVPB 3.375 g  Status:  Discontinued     3.375 g 12.5 mL/hr over 240 Minutes Intravenous Every 8 hours 04/02/19 0059 04/02/19 1219    04/02/19 0200  vancomycin (VANCOCIN) 1,500 mg in sodium chloride 0.9 % 500 mL IVPB     1,500 mg 250 mL/hr over 120 Minutes Intravenous  Once 04/02/19 0100 04/02/19 0923   04/01/19 2040  ceFAZolin (ANCEF) 2-4 GM/100ML-% IVPB    Note to Pharmacy: Channing Mutters   : cabinet override      04/01/19 2040 04/02/19 0844         Subjective: Patient seen and examined.  No overnight events.  She had low-grade temperature less than 100 last night.  She did not feel it.  Today denies any problems.  Was able to walk around with minimal help.  Objective: Vitals:   05/04/19 2155 05/05/19 0556 05/05/19 0758 05/05/19 0818  BP:  111/86 (!) 109/94   Pulse:   (!) 110 (!) 125  Resp:  19 17   Temp: 98.1 F (36.7 C) 99.2 F (37.3 C) 99.6 F (37.6 C)   TempSrc: Oral Oral Oral   SpO2:  100% 97%   Weight:  77.3 kg    Height:        Intake/Output Summary (Last 24 hours) at 05/05/2019 1406 Last data filed at 05/05/2019 1348 Gross per 24 hour  Intake 480 ml  Output --  Net 480 ml   Filed Weights   05/03/19 0231 05/04/19 0454 05/05/19 0556  Weight: 75.8 kg 76.2 kg 77.3 kg    Examination:  General exam: Appears calm and comfortable, on room air Respiratory system: Clear to auscultation. Respiratory effort normal. Cardiovascular system: S1 & S2 heard, RRR.  Central nervous system: Alert and oriented. No focal neurological deficits. Extremities: Symmetric 5 x 5 power. Skin: No rashes, lesions or ulcers    Data Reviewed: I have personally reviewed following labs and imaging studies  CBC: Recent Labs  Lab 04/30/19 0400 05/01/19 0412 05/02/19 0340 05/04/19 0400 05/05/19 0320  WBC 8.6 7.6 6.1 6.0 5.2  HGB 8.1* 7.9* 7.3* 7.7* 7.6*  HCT 26.0* 25.1* 23.4* 24.7* 25.1*  MCV 88.1 87.2 86.7 87.0 87.8  PLT 154 202 234 345 376   Basic Metabolic Panel: Recent Labs  Lab 04/28/19 2155 04/28/19 2205 04/29/19 0500 04/29/19 0500 04/29/19 1700 04/29/19 1700 04/30/19 0400 05/01/19 0412  05/02/19 0340 05/03/19 0502 05/05/19 0320  NA 138   < > 137   < > 133*   < > 132* 136 137 137  135  K 4.1   < > 4.3   < > 4.3   < > 3.9 3.0* 3.2* 3.8 3.9  CL 109   < > 108   < > 102   < > 100 102 98 102 104  CO2 19*   < > 20*   < > 22   < > 21* 27 32 28 24  GLUCOSE 125*   < > 109*   < > 114*   < > 102* 110* 92 95 100*  BUN 12   < > 11   < > 10   < > 12 9 16 10 8   CREATININE 0.58   < > 0.49   < > 0.56   < > 0.58 0.63 0.65 0.52 0.69  CALCIUM 7.8*   < > 8.0*   < > 8.6*   < > 8.4* 8.2* 8.3* 8.5* 8.5*  MG 2.4  --  2.0  --  1.9  --   --   --  1.6* 1.9  --    < > = values in this interval not displayed.   GFR: Estimated Creatinine Clearance: 107.9 mL/min (by C-G formula based on SCr of 0.69 mg/dL). Liver Function Tests: Recent Labs  Lab 05/01/19 0412 05/02/19 0340  AST 23 21  ALT 20 20  ALKPHOS 82 74  BILITOT 0.3 0.7  PROT 5.4* 5.0*  ALBUMIN 2.3* 2.2*   No results for input(s): LIPASE, AMYLASE in the last 168 hours. No results for input(s): AMMONIA in the last 168 hours. Coagulation Profile: Recent Labs  Lab 04/28/19 1500  INR 1.4*   Cardiac Enzymes: No results for input(s): CKTOTAL, CKMB, CKMBINDEX, TROPONINI in the last 168 hours. BNP (last 3 results) No results for input(s): PROBNP in the last 8760 hours. HbA1C: No results for input(s): HGBA1C in the last 72 hours. CBG: Recent Labs  Lab 05/01/19 1103 05/01/19 1534 05/01/19 2149 05/02/19 0618 05/02/19 2130  GLUCAP 89 111* 105* 92 102*   Lipid Profile: No results for input(s): CHOL, HDL, LDLCALC, TRIG, CHOLHDL, LDLDIRECT in the last 72 hours. Thyroid Function Tests: No results for input(s): TSH, T4TOTAL, FREET4, T3FREE, THYROIDAB in the last 72 hours. Anemia Panel: No results for input(s): VITAMINB12, FOLATE, FERRITIN, TIBC, IRON, RETICCTPCT in the last 72 hours. Sepsis Labs: No results for input(s): PROCALCITON, LATICACIDVEN in the last 168 hours.  Recent Results (from the past 240 hour(s))  MRSA PCR Screening      Status: None   Collection Time: 04/28/19  4:13 AM   Specimen: Nasopharyngeal  Result Value Ref Range Status   MRSA by PCR NEGATIVE NEGATIVE Final    Comment:        The GeneXpert MRSA Assay (FDA approved for NASAL specimens only), is one component of a comprehensive MRSA colonization surveillance program. It is not intended to diagnose MRSA infection nor to guide or monitor treatment for MRSA infections. Performed at [facility], [address] [place], [place]   Aerobic/Anaerobic Culture (surgical/deep wound)     Status: None   Collection Time: 04/28/19  9:06 AM   Specimen: PATH Cytology Misc. fluid; Body Fluid  Result Value Ref Range Status   Specimen Description WOUND PERICARDIAL FLUID  Final   Special Requests SPEC A SENT ON SWAB  Final   Gram Stain   Final    RARE WBC PRESENT, PREDOMINANTLY MONONUCLEAR NO ORGANISMS SEEN    Culture   Final    No growth aerobically or  anaerobically. Performed at [facility], [address], [place], [place] [address]    Report Status 05/03/2019 FINAL  Final  Aerobic/Anaerobic Culture (surgical/deep wound)     Status: None   Collection Time: 04/28/19 10:19 AM   Specimen: Aortic Valve Leaflets; Tissue  Result Value Ref Range Status   Specimen Description AORTIC VALVE TISSUE LEAFLETS  Final   Special Requests SPECIMEN B  Final   Gram Stain   Final    FEW WBC PRESENT,BOTH PMN AND MONONUCLEAR RARE GRAM POSITIVE COCCI    Culture   Final    No growth aerobically or anaerobically. Performed at [facility], [address] [place], [place] [address]    Report Status 05/03/2019 FINAL  Final  Aerobic/Anaerobic Culture (surgical/deep wound)     Status: None   Collection Time: 04/28/19 11:54 AM   Specimen: Heart Valve Leaflet(s); Tissue  Result Value Ref Range Status   Specimen Description VALVE TRICUSPID  Final   Special Requests SPECIMEN C  Final   Gram Stain NO WBC SEEN NO ORGANISMS SEEN   Final    Culture   Final    No growth aerobically or anaerobically. Performed at [facility], [address], [place], [place] [address]    Report Status 05/03/2019 FINAL  Final         Radiology Studies: No results found.      Scheduled Meds: . aspirin EC  81 mg Oral Daily   Or  . aspirin  81 mg Per Tube Daily  . bisacodyl  10 mg Oral Daily   Or  . bisacodyl  10 mg Rectal Daily  . busPIRone  7.5 mg Oral BID  . Chlorhexidine Gluconate Cloth  6 each Topical Daily  . clopidogrel  75 mg Oral Daily  . clotrimazole  1 Applicatorful Vaginal QHS  . [facility] Patient & Family Education   Does not apply Once  . docusate sodium  200 mg Oral Daily  . enoxaparin (LOVENOX) injection  30 mg Subcutaneous QHS  . escitalopram  10 mg Oral Daily  . feeding supplement (ENSURE ENLIVE)  237 mL Oral BID BM  . ferrous fumarate-b12-vitamic C-folic acid  1 capsule Oral BID PC  . furosemide  20 mg Oral Daily  . metoprolol tartrate  12.5 mg Oral BID   Or  . metoprolol tartrate  12.5 mg Per Tube BID  . pantoprazole  40 mg Oral Daily  . pneumococcal 23 valent vaccine  0.5 mL Intramuscular Tomorrow-1000  . potassium chloride  20 mEq Oral Daily  . QUEtiapine  25 mg Oral QHS  . sodium chloride flush  3 mL Intravenous Q12H   Continuous Infusions: . sodium chloride    . sodium chloride    . cefTRIAXone (ROCEPHIN)  IV 2 g (05/05/19 0825)  . lactated ringers Stopped (04/30/19 2141)  . vancomycin 750 mg (05/05/19 1131)     LOS: 34 days    Time spent: 25 minutes    Dorcas Carrow, MD [facility] Pager [phone]

## 2019-05-05 NOTE — TOC Progression Note (Signed)
Transition of Care (TOC) - Progression Note  Donn Pierini RN, BSN Transitions of Care Unit 4E- RN Case Manager [phone]   Patient Details  Name: Lauren Reyes MRN: [id number] Date of Birth: 05-15-1990  Transition of Care [facility]) CM/SW Contact  Zenda Alpers, Lenn Sink, RN Phone Number: 05/05/2019, 10:31 AM  Clinical Narrative:    Pt. S/p AVR, endocarditis, hx IVDU, will need in house IV abx with end date of 5/31. TOC to follow for transition of care needs.    Expected Discharge Plan: Home/Self Care Barriers to Discharge: Continued Medical Work up  Expected Discharge Plan and Services Expected Discharge Plan: Home/Self Care In-house Referral: Clinical Social Work Discharge Planning Services: CM Consult                                           Social Determinants of Health (SDOH) Interventions    Readmission Risk Interventions No flowsheet data found.

## 2019-05-05 NOTE — Progress Notes (Signed)
Physical Therapy Treatment Patient Details Name: Lauren Reyes MRN: [id number] DOB: [birth year] Today's Date: 05/05/2019    History of Present Illness Pt is 29 year old female who was admitted with suspected IV drug abuse was being interviewed by police and developed right-sided facial weakness, aphasia, and R weakness.  She was brought by EMS to the emergency room work-up revealed occluded left MCA. Revascularization performed by IR. CT head and neck demonstrate multiple small cavitary abscesses in the upper lobes bilaterally. IVC filter placement for RLE DVT on 3/26. Pt on antibiotics for multivalvular endocarditis.  Underwent AVR and tricuspid valve repair on 04/28/19.    PT Comments    Pt making good progress with mobility. Beginning to amb some without assistive device.    Follow Up Recommendations  Supervision - Intermittent;Other (comment)(pt likely inpt until end of May due to IV antibiotics)     Equipment Recommendations  Other (comment)(TBA)    Recommendations for Other Services       Precautions / Restrictions Precautions Precautions: Sternal    Mobility  Bed Mobility               General bed mobility comments: Pt up in chair  Transfers Overall transfer level: Needs assistance Equipment used: None Transfers: Sit to/from Stand;Stand Pivot Transfers Sit to Stand: Supervision         General transfer comment: Verbal cues for sternal precautions with pt placing hands on knees to come to stand  Ambulation/Gait Ambulation/Gait assistance: Min guard;Supervision Gait Distance (Feet): 500 Feet Assistive device: 4-wheeled walker;None Gait Pattern/deviations: Step-through pattern;Decreased stride length;Shuffle Gait velocity: decr Gait velocity interpretation: 1.31 - 2.62 ft/sec, indicative of limited community ambulator General Gait Details: min guard without assistive device and supervision when using rollator   Stairs             Wheelchair  Mobility    Modified Rankin (Stroke Patients Only) Modified Rankin (Stroke Patients Only) Pre-Morbid Rankin Score: No symptoms Modified Rankin: Moderately severe disability     Balance Overall balance assessment: Needs assistance Sitting-balance support: No upper extremity supported;Feet supported Sitting balance-Leahy Scale: Good     Standing balance support: No upper extremity supported;During functional activity Standing balance-Leahy Scale: Fair                              Cognition Arousal/Alertness: Awake/alert Behavior During Therapy: WFL for tasks assessed/performed Overall Cognitive Status: Within Functional Limits for tasks assessed                                        Exercises      General Comments General comments (skin integrity, edema, etc.): HR 90's at rest and 115 with activity      Pertinent Vitals/Pain Pain Assessment: No/denies pain    Home Living                      Prior Function            PT Goals (current goals can now be found in the care plan section) Progress towards PT goals: Progressing toward goals    Frequency    Min 3X/week      PT Plan Current plan remains appropriate    Co-evaluation              AM-PAC PT "6 Clicks" Mobility  Outcome Measure  Help needed turning from your back to your side while in a flat bed without using bedrails?: A Little Help needed moving from lying on your back to sitting on the side of a flat bed without using bedrails?: A Little Help needed moving to and from a bed to a chair (including a wheelchair)?: A Little Help needed standing up from a chair using your arms (e.g., wheelchair or bedside chair)?: None Help needed to walk in hospital room?: A Little Help needed climbing 3-5 steps with a railing? : A Little 6 Click Score: 19    End of Session Equipment Utilized During Treatment: Gait belt Activity Tolerance: Patient tolerated treatment  well Patient left: with call bell/phone within reach;in chair   PT Visit Diagnosis: Other abnormalities of gait and mobility (R26.89);Pain     Time: 1016-1030 PT Time Calculation (min) (ACUTE ONLY): 14 min  Charges:  $Gait Training: 8-22 mins                     [facility] PT Acute Rehabilitation Services Pager [phone] Office [phone]    Angelina Ok [facility] 05/05/2019, 12:22 PM

## 2019-05-05 NOTE — Progress Notes (Signed)
Pharmacy Antibiotic Note Lauren Reyes is a 29 y.o. female with endocarditis involving aortic, mitral, and tricuspid valves with severe aortic regurgitation. Patient's condition is further complicated by septic emboli to her brain, lungs, and spleen. Respiratory culture grew MSSA, however, recent blood cultures are negative. She is s/p AVR/MVR/TVR on 4/20.  *Noted that doses are being adjusted on troughs alone due to concern of cerebritis *F/u CMV DNA PCR and possible need for treatment - ID says does not appear to be any evidence of widespread CMV infection - do not think she needs treatment.  Updates: -All valve cultures negative -Last vancomycin trough 4/21 = 16 (therapeutic) -WBC= 5.2, afebrile, SCr= 0.69  Plan: - Continue Vancomycin at 750 mg IV q 8 hours  - Consider weekly checks while renal function remains stable- will check 4/28. - Continue ceftriaxone IV 2g q12 per ID - Monitor renal function, clinical improvement, WBC, and temperature - End date for antibiotics: 06/09/19  Height: 5\' 6"  (167.6 cm) Weight: 77.3 kg (170 lb 8 oz) IBW/kg (Calculated) : 59.3  Temp (24hrs), Avg:99.6 F (37.6 C), Min:98.1 F (36.7 C), Max:100.7 F (38.2 C)  Recent Labs  Lab 04/29/19 1100 04/29/19 1700 04/30/19 0400 05/01/19 0412 05/02/19 0340 05/03/19 0502 05/04/19 0400 05/05/19 0320  WBC  --    < > 8.6 7.6 6.1  --  6.0 5.2  CREATININE  --    < > 0.58 0.63 0.65 0.52  --  0.69  VANCOTROUGH 16  --   --   --   --   --   --   --    < > = values in this interval not displayed.    Estimated Creatinine Clearance: 107.9 mL/min (by C-G formula based on SCr of 0.69 mg/dL).    No Known Allergies  Antimicrobials this admission: Zosyn 3/25 >> 3/25 Cefepime 3/25 >> 3/27 Ancef 3/27 >> 3/30 Vanc 3/25 >> 3/27; restarted 3/30 >> (6/1) Unasyn 3/30>>3/31 Ceftriaxone 3/31>>(6/1)  Microbiology results: 3/24 COVID/Flu >> neg 3/25 RCx >> moderate MSSA 3/25 MRSA PCR >> negative 3/25 BCx >>  negative  4/16 MRSA PCR >> neg 4/20 MRSA PCR >> neg 4/20 Tricuspid valve specimen: neg 4/20 pericardial fluid: neg 4/20 aortic valve leaflets: neg  Thank you for allowing pharmacy to be a part of this patient's care.  5/20, [facility] Clinical Pharmacist Phone [phone]  05/05/2019 9:38 AM

## 2019-05-05 NOTE — Progress Notes (Addendum)
[address]       [place] [address]             [phone]      7 Days Post-Op Procedure(s) (LRB): AORTIC VALVE REPLACEMENT (AVR) using Edwards PERIMOUNT Magna Ease Pericardial Bioprosthesis - 29 MM Aortic Valve. (N/A) REPAIR OF TRICUSPID (N/A) TRANSESOPHAGEAL ECHOCARDIOGRAM (TEE) (N/A) Subjective: Feels okay this morning. No issues overnight.   Objective: Vital signs in last 24 hours: Temp:  [97.8 F (36.6 C)-100.7 F (38.2 C)] 99.2 F (37.3 C) (04/27 0556) Pulse Rate:  [112] 112 (04/26 0836) Cardiac Rhythm: Sinus tachycardia (04/26 2100) Resp:  [19-20] 19 (04/27 0556) BP: (106-121)/(79-90) 111/86 (04/27 0556) SpO2:  [99 %-100 %] 100 % (04/27 0556) Weight:  [77.3 kg] 77.3 kg (04/27 0556)     Intake/Output from previous day: 04/26 0701 - 04/27 0700 In: 120 [P.O.:120] Out: 1 [Stool:1] Intake/Output this shift: No intake/output data recorded.  General appearance: alert, cooperative and no distress Heart: regular rate and rhythm, S1, S2 normal, no murmur, click, rub or gallop Lungs: clear to auscultation bilaterally Abdomen: soft, non-tender; bowel sounds normal; no masses,  no organomegaly Extremities: extremities normal, atraumatic, no cyanosis or edema Wound: clean and dry  Lab Results: Recent Labs    05/04/19 0400 05/05/19 0320  WBC 6.0 5.2  HGB 7.7* 7.6*  HCT 24.7* 25.1*  PLT 345 376   BMET:  Recent Labs    05/03/19 0502 05/05/19 0320  NA 137 135  K 3.8 3.9  CL 102 104  CO2 28 24  GLUCOSE 95 100*  BUN 10 8  CREATININE 0.52 0.69  CALCIUM 8.5* 8.5*    PT/INR: No results for input(s): LABPROT, INR in the last 72 hours. ABG    Component Value Date/Time   PHART 7.365 04/28/2019 2205   HCO3 24.5 04/28/2019 2205   TCO2 26 04/28/2019 2205   ACIDBASEDEF 1.0 04/28/2019 2205   O2SAT 97.0 04/28/2019 2205   CBG (last 3)  Recent Labs    05/02/19 2130  GLUCAP 102*    Assessment/Plan: S/P Procedure(s) (LRB): AORTIC  VALVE REPLACEMENT (AVR) using Edwards PERIMOUNT Magna Ease Pericardial Bioprosthesis - 29 MM Aortic Valve. (N/A) REPAIR OF TRICUSPID (N/A) TRANSESOPHAGEAL ECHOCARDIOGRAM (TEE) (N/A)  1. CV - Sinus tachycardia. On Lopressor 12.5 mg bid-BP too low to titrate.  2. Pulmonary - On room air. CXR stable.  3. Volume Overload - continue oral lasix. Remains 3kg over baseline.  4. Expected post op acute blood loss anemia - H and H this 7.6/25.1 Continue Trinsicon. 5. ID-on Vancomycin and Ceftriaxone, GPC on gram stain. Patient will need 6 weeks of IV antibiotic (end date 05/31) 6. Neurology-CVA. s/p leftcommon carotid arteriogram followed by complete revascularization of occluded M3 region of inferior division ofleft MCA complicated by dissection/pseudoaneurysm of left ICA-s/p placement of a 4.5 mm x 50mm pipeline flow diverter at site of focal dissection associated with a small pseudoaneurysm of distal cervical LT ICA. DAPT (asa and plavix) recommended for 3-6 months but Dr. Estanislado Pandy to determine exact regimen and patient will need to follow up in the stroke clinic with Dr. Leonie Man after discharge. 7. GI-per speech pathology, regular;Thin liquid Liquid Administration via: Cup;Straw Medication Administration: Whole meds with puree Supervision: Patient able to self feed 8. History of poly stubstance abuse-will need assistance with rehab. Will decrease oxycodone dose since pain has been well controlled.  9. Right DVT, groin hematoma-s/p IVC filter 03/26 10. Yeast infection-Diflucan for one dose on  4/25 and then Monistat  Plan: Continue medical plan. Decreasing oxycodone to 5mg  q 6 hours. Pain has been well controlled. She is ambulating 3x daily. She continues to progress. Appetite is returning.    LOS: 34 days    05/05/2019   Chart reviewed, patient examined, agree with above. She feels well and is ambulating without difficulty.  Continuing antibiotics for endocarditis.

## 2019-05-06 ENCOUNTER — Inpatient Hospital Stay (HOSPITAL_COMMUNITY): Payer: Medicaid Other

## 2019-05-06 LAB — CBC
HCT: 24.7 % — ABNORMAL LOW (ref 36.0–46.0)
Hemoglobin: 7.8 g/dL — ABNORMAL LOW (ref 12.0–15.0)
MCH: 27.2 pg (ref 26.0–34.0)
MCHC: 31.6 g/dL (ref 30.0–36.0)
MCV: 86.1 fL (ref 80.0–100.0)
Platelets: 437 10*3/uL — ABNORMAL HIGH (ref 150–400)
RBC: 2.87 MIL/uL — ABNORMAL LOW (ref 3.87–5.11)
RDW: 18.4 % — ABNORMAL HIGH (ref 11.5–15.5)
WBC: 6 10*3/uL (ref 4.0–10.5)
nRBC: 0 % (ref 0.0–0.2)

## 2019-05-06 LAB — BASIC METABOLIC PANEL
Anion gap: 11 (ref 5–15)
BUN: 9 mg/dL (ref 6–20)
CO2: 21 mmol/L — ABNORMAL LOW (ref 22–32)
Calcium: 8.6 mg/dL — ABNORMAL LOW (ref 8.9–10.3)
Chloride: 106 mmol/L (ref 98–111)
Creatinine, Ser: 0.63 mg/dL (ref 0.44–1.00)
GFR calc Af Amer: >60 mL/min (ref >60)
GFR calc non Af Amer: >60 mL/min (ref >60)
Glucose, Bld: 95 mg/dL (ref 70–99)
Potassium: 3.9 mmol/L (ref 3.5–5.1)
Sodium: 138 mmol/L (ref 135–145)

## 2019-05-06 LAB — VANCOMYCIN, TROUGH: Vancomycin Tr: 16 ug/mL (ref 15–20)

## 2019-05-06 MED ORDER — METOLAZONE 5 MG PO TABS
2.5000 mg | ORAL_TABLET | Freq: Every day | ORAL | Status: AC
  Administered 2019-05-06 – 2019-05-08 (×3): 2.5 mg via ORAL
  Filled 2019-05-06 (×3): qty 1

## 2019-05-06 MED ORDER — DIPHENHYDRAMINE HCL 25 MG PO CAPS
25.0000 mg | ORAL_CAPSULE | Freq: Every evening | ORAL | Status: DC | PRN
  Administered 2019-05-07 – 2019-05-10 (×4): 25 mg via ORAL
  Filled 2019-05-06 (×4): qty 1

## 2019-05-06 MED ORDER — POTASSIUM CHLORIDE CRYS ER 20 MEQ PO TBCR
20.0000 meq | EXTENDED_RELEASE_TABLET | Freq: Three times a day (TID) | ORAL | Status: DC
  Administered 2019-05-06 – 2019-06-09 (×102): 20 meq via ORAL
  Filled 2019-05-06 (×102): qty 1

## 2019-05-06 MED ORDER — ACETAMINOPHEN 325 MG PO TABS
650.0000 mg | ORAL_TABLET | Freq: Four times a day (QID) | ORAL | Status: DC | PRN
  Administered 2019-05-06 – 2019-06-07 (×28): 650 mg via ORAL
  Filled 2019-05-06 (×29): qty 2

## 2019-05-06 MED ORDER — FUROSEMIDE 40 MG PO TABS
40.0000 mg | ORAL_TABLET | Freq: Every day | ORAL | Status: DC
  Administered 2019-05-06 – 2019-05-13 (×8): 40 mg via ORAL
  Filled 2019-05-06 (×8): qty 1

## 2019-05-06 MED FILL — Mannitol IV Soln 20%: INTRAVENOUS | Qty: 500 | Status: AC

## 2019-05-06 MED FILL — Lidocaine HCl Local Soln Prefilled Syringe 100 MG/5ML (2%): INTRAMUSCULAR | Qty: 5 | Status: AC

## 2019-05-06 MED FILL — Sodium Chloride IV Soln 0.9%: INTRAVENOUS | Qty: 2000 | Status: AC

## 2019-05-06 MED FILL — Albumin, Human Inj 5%: INTRAVENOUS | Qty: 250 | Status: AC

## 2019-05-06 MED FILL — Electrolyte-R (PH 7.4) Solution: INTRAVENOUS | Qty: 5000 | Status: AC

## 2019-05-06 MED FILL — Heparin Sodium (Porcine) Inj 1000 Unit/ML: INTRAMUSCULAR | Qty: 20 | Status: AC

## 2019-05-06 NOTE — Progress Notes (Addendum)
[address]       [place], [address]             [phone]      8 Days Post-Op Procedure(s) (LRB): AORTIC VALVE REPLACEMENT (AVR) using Edwards PERIMOUNT Magna Ease Pericardial Bioprosthesis - 29 MM Aortic Valve. (N/A) REPAIR OF TRICUSPID (N/A) TRANSESOPHAGEAL ECHOCARDIOGRAM (TEE) (N/A) Subjective: Feels okay this morning. States that her yeast infection is improving.   Objective: Vital signs in last 24 hours: Temp:  [98 F (36.7 C)-99.6 F (37.6 C)] 98.9 F (37.2 C) (04/28 0437) Pulse Rate:  [107-125] 109 (04/28 0437) Cardiac Rhythm: Sinus tachycardia (04/27 1900) Resp:  [17-24] 18 (04/28 0437) BP: (103-120)/(78-94) 109/82 (04/28 0437) SpO2:  [90 %-100 %] 94 % (04/28 0437) Weight:  [77.3 kg] 77.3 kg (04/28 0437)    Intake/Output from previous day: 04/27 0701 - 04/28 0700 In: 730 [P.O.:480; IV Piggyback:250] Out: -  Intake/Output this shift: No intake/output data recorded.  General appearance: alert, cooperative and no distress Heart: regular rate and rhythm, S1, S2 normal, no murmur, click, rub or gallop Lungs: clear to auscultation bilaterally, diminished in the lower lobes Abdomen: soft, non-tender; bowel sounds normal; no masses,  no organomegaly Extremities: extremities normal, atraumatic, no cyanosis or edema Wound: clean and dry  Lab Results: Recent Labs    05/05/19 0320 05/06/19 0438  WBC 5.2 6.0  HGB 7.6* 7.8*  HCT 25.1* 24.7*  PLT 376 437*   BMET:  Recent Labs    05/05/19 0320 05/06/19 0438  NA 135 138  K 3.9 3.9  CL 104 106  CO2 24 21*  GLUCOSE 100* 95  BUN 8 9  CREATININE 0.69 0.63  CALCIUM 8.5* 8.6*    PT/INR: No results for input(s): LABPROT, INR in the last 72 hours. ABG    Component Value Date/Time   PHART 7.365 04/28/2019 2205   HCO3 24.5 04/28/2019 2205   TCO2 26 04/28/2019 2205   ACIDBASEDEF 1.0 04/28/2019 2205   O2SAT 97.0 04/28/2019 2205   CBG (last 3)  No results for input(s): GLUCAP in the  last 72 hours.  Assessment/Plan: S/P Procedure(s) (LRB): AORTIC VALVE REPLACEMENT (AVR) using Edwards PERIMOUNT Magna Ease Pericardial Bioprosthesis - 29 MM Aortic Valve. (N/A) REPAIR OF TRICUSPID (N/A) TRANSESOPHAGEAL ECHOCARDIOGRAM (TEE) (N/A)  1. CV -Sinus tachycardia. On Lopressor 12.5 mg bid-BP too low to titrate. 2. Pulmonary - On room air. CXR showed increased pleural effusion on the right 3. Volume Overload -continue oral lasix.Continue daily weights. May need more diuresis.  4. Expected post op acute blood loss anemia - H and H this7.8/24.7Continue Trinsicon. 5. ID-on Vancomycin and Ceftriaxone, GPC on gram stain. Patient will need 6 weeks of IV antibiotic (end date 05/31) 6. Neurology-CVA.DAPT (asa and plavix) recommended for 3-6 months but Dr. Estanislado Pandy to determine exact regimen and patient will need to follow up in the stroke clinic with Dr. Leonie Man after discharge. 7. GI-per speech pathology, regular;Thin liquid Liquid Administration via: Cup;Straw Medication Administration: Whole meds with puree Supervision: Patient able to self feed 8. History of poly stubstance abuse-will need assistance with rehab. Continue oxycodone q 6 hours PRN. 9. Right DVT, groin hematoma-s/p IVC filter 03/26 10.Yeast infection-Diflucan for one doseon 4/25and then Monistat  Plan: No significant fevers. Continue IV antibiotics for endocarditis. Appreciate medicine's assistance. Right pleural effusion may need thoracentesis if continues to progress.    LOS: 35 days    Elgie Collard 05/06/2019   Chart reviewed, patient examined, agree  with above. CXR today shows increased right pleural effusion compared to 4 days ago. Wt has been going up on lasix 20 mg daily and she is at least 10 lbs over preop wt. Will increase lasix to 40 mg daily and give metolazone 2.5 daily for 3 days. Increase K+ replacement. She may need right thoracentesis is this does not resolve with further diuresis.

## 2019-05-06 NOTE — Progress Notes (Signed)
CARDIAC REHAB PHASE I   PRE:  Rate/Rhythm: 107 ST    BP: sitting 117/81    SaO2: 97 RA  MODE:  Ambulation: 640 ft   POST:  Rate/Rhythm: 133 ST    BP: sitting 103/85     SaO2: 100 RA  Tolerated well. Able to get out of bed independently and walk with RW. She requested RW this am since she just woke up. Rest x1. HR elevated to 133 ST. To BR then recliner after walk. Positive attitude, grateful for care. Plans to walk x2 more today, using IS.  [phone]  Harriet Masson CES, ACSM 05/06/2019 8:37 AM

## 2019-05-06 NOTE — Progress Notes (Signed)
Pharmacy Antibiotic Note Lauren Reyes is a 29 y.o. female with endocarditis involving aortic, mitral, and tricuspid valves with severe aortic regurgitation. Patient's condition is further complicated by septic emboli to her brain, lungs, and spleen. Respiratory culture grew MSSA, however, recent blood cultures are negative. She is s/p AVR/MVR/TVR on 4/20.  VT very stable on current dose at 16 and therapeutic. Will continue weekly montiroing. Vancomycin doses are being adjusted on troughs alone due to concern of cerebritis, goal 15-20.   Plan: - Continue Vancomycin at 750 mg IV q 8 hours  - F/u weekly vancomycin level, next due 5/5  - Continue ceftriaxone IV 2g q12 per ID - Monitor renal function, clinical status - End date for antibiotics: 06/09/19  Height: 5\' 6"  (167.6 cm) Weight: 77.3 kg (170 lb 8 oz) IBW/kg (Calculated) : 59.3  Temp (24hrs), Avg:98.9 F (37.2 C), Min:98 F (36.7 C), Max:100.4 F (38 C)  Recent Labs  Lab 05/01/19 0412 05/02/19 0340 05/03/19 0502 05/04/19 0400 05/05/19 0320 05/06/19 0438 05/06/19 1300  WBC 7.6 6.1  --  6.0 5.2 6.0  --   CREATININE 0.63 0.65 0.52  --  0.69 0.63  --   VANCOTROUGH  --   --   --   --   --   --  16    Estimated Creatinine Clearance: 107.9 mL/min (by C-G formula based on SCr of 0.63 mg/dL).    No Known Allergies  Antimicrobials this admission: Zosyn 3/25 >> 3/25 Cefepime 3/25 >> 3/27 Ancef 3/27 >> 3/30 Vanc 3/25 >> 3/27; restarted 3/30 >> (6/1) Unasyn 3/30>>3/31 Ceftriaxone 3/31>> (6/1) Flucon 4/25 Clotrimazole cream 4/25> 5/1   4/28 VT 16  4/21 VT 16 4/16 VT 17  4/10 VT (drawn 6hr after dose) 16  4/7 VT 23  Microbiology results: 3/24 COVID/Flu >> neg 3/25 RCx >> moderate MSSA 3/25 MRSA PCR >> negative 3/25 BCx >> negative  4/16 MRSA PCR >> neg 4/20 MRSA PCR >> neg 4/20 Tricuspid valve specimen: neg 4/20 pericardial fluid: neg 4/20 aortic valve leaflets: neg  Thank you for allowing pharmacy to be a  part of this patient's care.  5/20, PharmD, BCPS, BCCP Clinical Pharmacist  Please check AMION for all [facility] Pharmacy phone numbers After 10:00 PM, call Main Pharmacy [phone]

## 2019-05-06 NOTE — Progress Notes (Signed)
PROGRESS NOTE    Lauren Reyes  CHE:[id number] DOB: [birth year] DOA: 04/01/2019 PCP: Patient, No Pcp Per    Brief Narrative:  29 year old female with past medical history with substance abuse with heroin, crystal meth and cocaine as well as smoking presents to the ER with aphasia and right-sided weakness and code stroke.  Admitted to neurology ICU.  Patient was intubated underwent thrombectomy for left MCA M3 occlusion and recanalization.  This was complicated by dissection and pseudoaneurysm of the left ICA SP stent placement. Extubated on April 04, 2019.  Also had a DVT but treated with IVC filter.  TEE showed infective endocarditis. S/P bioprosthetic aortic valve replacement and tricuspid valve repair on 4/20 Currently further plan is continue IV antibiotics for endocarditis until 5/31  4/28: Patient has low-grade temperature 100 degree and tachycardia since last 24 hours.  We will repeat blood cultures today.  If persistent fever, will discuss with infectious disease.  Assessment & Plan:   Active Problems:   Acute ischemic left MCA stroke (HCC)   Middle cerebral artery embolism, left   DVT (deep venous thrombosis) (HCC)   Encounter for central line placement   Endotracheal tube present   Cerebrovascular accident (CVA) due to embolism of precerebral artery (HCC)   Infective endocarditis   Normocytic anemia   Polysubstance abuse (HCC)   IVDU (intravenous drug user)   Tachypnea   Endocarditis  1.  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 Severe AR. SP aortic valve replacement with bioprosthetic valve and tricuspid repair. MSSA in sputum.  Suspected CMV infection Initially presented with code stroke, intubated and admitted to the neuro ICU. Tracheal aspirate grew MSSA. So far tissue culture with no growth to date. Remains on IV vancomycin and IV ceftriaxone. 6 weeks of antibiotics post surgery through Jun 08, 2019. On the path report staining looks like  CMV in some areas.  Followed by ID. PICC line inserted. Patient will remain in a supervised setting for completion of the antibiotics. Repeat cultures today.  2.  Aortic valve endocarditis. SP aortic valve replacement 04/28/2019 and tricuspid repair Cardiothoracic surgery following.  3.  Acute CVA of the MCA territory.  Embolic in nature. TPA was deferred as concern for septic emboli. SP left common carotid arteriogram with complete revascularization of the occluded M3. Complicated by dissection and pseudoaneurysm of the left ICA S/P placement of 4.5 mm x 25 mm pipeline flow diverter stent. Initially was on aspirin and Brilinta.  Brilinta was held for cardiothoracic surgery. Patient has been off Brilinta but on aspirin for cardiothoracic surgery. Patient has intracranial stenting therefore will require dual antiplatelet therapy with aspirin and Plavix per neurology. Continue this for 3 to 6 months. Patient will follow up with Dr. Corliss Skains and decide on exact regimen. Patient will also require follow-up with neurology.  4.  Right common femoral DVT, Right groin hematoma, IVC filter placement April 03, 2019. During the hospitalization patient was found to have right common femoral DVT. Patient also was found to have large right groin hematoma post vascular intervention Decision was made to place IVC filter placement since the patient was felt not a candidate for therapeutic anticoagulation given her septic emboli, dissection as well as hematoma.  5.  Acute blood loss anemia. Iron deficiency anemia. B12 deficiency. Folate deficiency. Continue supplementation. H&H relatively stable. No active bleeding identified. No GI bleed. Transfuse to hemoglobin less than 7 or hemodynamic instability.  6.  Polysubstance abuse. UDS positive for THC, amphetamine admission. Suspected IV drug use  as well with needle marks. Currently on oral pain medication. Out of the window for any  withdrawals.  7.  Anxiety. Currently on BuSpar, Lexapro, Seroquel.  May not require Seroquel at discharge. As needed Ativan.  8.  Hepatitis C antibody positive. Negative viral load. HIV screen negative. RPR negative. Will require outpatient follow-up and therapy.  9.  Pain control. On as needed oxycodone.  Suggested patient to use Tylenol and decrease dose of oxycodone.   DVT prophylaxis: Heparin subcu Code Status: Full code Family Communication: None Disposition Plan: Status is: Inpatient  Remains inpatient appropriate because:IV treatments appropriate due to intensity of illness or inability to take PO   Dispo: The patient is from: Home              Anticipated d/c is to: Home              Anticipated d/c date is: > 3 days              Patient currently is not medically stable to d/c.          Consultants:   CT surgery  Infectious disease  Procedures:   Aortic valve replacement, 4/20  Antimicrobials:  Anti-infectives (From admission, onward)   Start     Dose/Rate Route Frequency Ordered Stop   05/03/19 1000  fluconazole (DIFLUCAN) tablet 150 mg     150 mg Oral  Once 05/03/19 0804 05/03/19 1243   04/28/19 0400  vancomycin (VANCOREADY) IVPB 1250 mg/250 mL  Status:  Discontinued     1,250 mg 166.7 mL/hr over 90 Minutes Intravenous To Surgery 04/27/19 1131 04/28/19 1451   04/28/19 0400  cefUROXime (ZINACEF) 1.5 g in sodium chloride 0.9 % 100 mL IVPB     1.5 g 200 mL/hr over 30 Minutes Intravenous To Surgery 04/27/19 1131 04/28/19 0819   04/28/19 0400  cefUROXime (ZINACEF) 750 mg in sodium chloride 0.9 % 100 mL IVPB     750 mg 200 mL/hr over 30 Minutes Intravenous To Surgery 04/27/19 1131 04/28/19 1313   04/15/19 0900  vancomycin (VANCOREADY) IVPB 750 mg/150 mL     750 mg 150 mL/hr over 60 Minutes Intravenous Every 8 hours 04/15/19 0821     04/13/19 1400  vancomycin (VANCOCIN) IVPB 1000 mg/200 mL premix  Status:  Discontinued     1,000 mg 200 mL/hr  over 60 Minutes Intravenous Every 8 hours 04/13/19 0839 04/15/19 0821   04/12/19 2200  vancomycin (VANCOREADY) IVPB 750 mg/150 mL  Status:  Discontinued     750 mg 150 mL/hr over 60 Minutes Intravenous Every 8 hours 04/12/19 1500 04/13/19 0839   04/10/19 1336  vancomycin (VANCOCIN) IVPB 1000 mg/200 mL premix  Status:  Discontinued     1,000 mg 200 mL/hr over 60 Minutes Intravenous Every 8 hours 04/10/19 1320 04/12/19 1500   04/08/19 1200  cefTRIAXone (ROCEPHIN) 2 g in sodium chloride 0.9 % 100 mL IVPB     2 g 200 mL/hr over 30 Minutes Intravenous Every 12 hours 04/08/19 1013 06/09/19 2359   04/08/19 0530  vancomycin (VANCOCIN) IVPB 1000 mg/200 mL premix  Status:  Discontinued     1,000 mg 200 mL/hr over 60 Minutes Intravenous Every 12 hours 04/07/19 1541 04/10/19 1320   04/07/19 1900  Ampicillin-Sulbactam (UNASYN) 3 g in sodium chloride 0.9 % 100 mL IVPB  Status:  Discontinued     3 g 200 mL/hr over 30 Minutes Intravenous Every 6 hours 04/07/19 1457 04/08/19 1013   04/07/19 1730  vancomycin (VANCOREADY) IVPB 1500 mg/300 mL     1,500 mg 150 mL/hr over 120 Minutes Intravenous  Once 04/07/19 1541 04/07/19 1937   04/04/19 1400  ceFAZolin (ANCEF) IVPB 2g/100 mL premix  Status:  Discontinued     2 g 200 mL/hr over 30 Minutes Intravenous Every 8 hours 04/04/19 1119 04/07/19 1457   04/04/19 0400  Vancomycin (VANCOCIN) 1,250 mg in sodium chloride 0.9 % 250 mL IVPB  Status:  Discontinued     1,250 mg 166.7 mL/hr over 90 Minutes Intravenous Every 12 hours 04/03/19 1635 04/04/19 1159   04/02/19 1800  ceFEPIme (MAXIPIME) 2 g in sodium chloride 0.9 % 100 mL IVPB  Status:  Discontinued     2 g 200 mL/hr over 30 Minutes Intravenous Every 8 hours 04/02/19 1218 04/04/19 1127   04/02/19 1600  vancomycin (VANCOCIN) IVPB 1000 mg/200 mL premix  Status:  Discontinued     1,000 mg 200 mL/hr over 60 Minutes Intravenous Every 12 hours 04/02/19 0100 04/03/19 1635   04/02/19 0200  piperacillin-tazobactam (ZOSYN)  IVPB 3.375 g  Status:  Discontinued     3.375 g 12.5 mL/hr over 240 Minutes Intravenous Every 8 hours 04/02/19 0059 04/02/19 1219   04/02/19 0200  vancomycin (VANCOCIN) 1,500 mg in sodium chloride 0.9 % 500 mL IVPB     1,500 mg 250 mL/hr over 120 Minutes Intravenous  Once 04/02/19 0100 04/02/19 0923   04/01/19 2040  ceFAZolin (ANCEF) 2-4 GM/100ML-% IVPB    Note to Pharmacy: Channing Mutters   : cabinet override      04/01/19 2040 04/02/19 0844         Subjective: Patient seen and examined in the morning rounds.  No complaints.  She was able to independently move around. Recorded sinus tachycardia with temperature 100.4.  Objective: Vitals:   05/05/19 2015 05/05/19 2322 05/06/19 0437 05/06/19 0759  BP: 120/81 103/78 109/82   Pulse: (!) 116 (!) 112 (!) 109   Resp:  20 18   Temp: 98.1 F (36.7 C) 98 F (36.7 C) 98.9 F (37.2 C) (!) 100.4 F (38 C)  TempSrc:  Oral Oral Oral  SpO2: 90% 92% 94%   Weight:   77.3 kg   Height:        Intake/Output Summary (Last 24 hours) at 05/06/2019 1148 Last data filed at 05/06/2019 1147 Gross per 24 hour  Intake 500 ml  Output --  Net 500 ml   Filed Weights   05/04/19 0454 05/05/19 0556 05/06/19 0437  Weight: 76.2 kg 77.3 kg 77.3 kg    Examination:  General exam: Appears calm and comfortable, on room air Respiratory system: Clear to auscultation. Respiratory effort normal.  Midline surgical scar looks clean and nontender. Cardiovascular system: S1 & S2 heard, RRR.  Central nervous system: Alert and oriented. No focal neurological deficits. Extremities: Symmetric 5 x 5 power. Skin: No rashes, lesions or ulcers PICC line looks clean and nontender.    Data Reviewed: I have personally reviewed following labs and imaging studies  CBC: Recent Labs  Lab 05/01/19 0412 05/02/19 0340 05/04/19 0400 05/05/19 0320 05/06/19 0438  WBC 7.6 6.1 6.0 5.2 6.0  HGB 7.9* 7.3* 7.7* 7.6* 7.8*  HCT 25.1* 23.4* 24.7* 25.1* 24.7*  MCV 87.2 86.7  87.0 87.8 86.1  PLT 202 234 345 376 437*   Basic Metabolic Panel: Recent Labs  Lab 04/29/19 1700 04/30/19 0400 05/01/19 0412 05/02/19 0340 05/03/19 0502 05/05/19 0320 05/06/19 0438  NA 133*   < >  136 137 137 135 138  K 4.3   < > 3.0* 3.2* 3.8 3.9 3.9  CL 102   < > 102 98 102 104 106  CO2 22   < > 27 32 28 24 21*  GLUCOSE 114*   < > 110* 92 95 100* 95  BUN 10   < > 9 16 10 8 9   CREATININE 0.56   < > 0.63 0.65 0.52 0.69 0.63  CALCIUM 8.6*   < > 8.2* 8.3* 8.5* 8.5* 8.6*  MG 1.9  --   --  1.6* 1.9  --   --    < > = values in this interval not displayed.   GFR: Estimated Creatinine Clearance: 107.9 mL/min (by C-G formula based on SCr of 0.63 mg/dL). Liver Function Tests: Recent Labs  Lab 05/01/19 0412 05/02/19 0340  AST 23 21  ALT 20 20  ALKPHOS 82 74  BILITOT 0.3 0.7  PROT 5.4* 5.0*  ALBUMIN 2.3* 2.2*   No results for input(s): LIPASE, AMYLASE in the last 168 hours. No results for input(s): AMMONIA in the last 168 hours. Coagulation Profile: No results for input(s): INR, PROTIME in the last 168 hours. Cardiac Enzymes: No results for input(s): CKTOTAL, CKMB, CKMBINDEX, TROPONINI in the last 168 hours. BNP (last 3 results) No results for input(s): PROBNP in the last 8760 hours. HbA1C: No results for input(s): HGBA1C in the last 72 hours. CBG: Recent Labs  Lab 05/01/19 1103 05/01/19 1534 05/01/19 2149 05/02/19 0618 05/02/19 2130  GLUCAP 89 111* 105* 92 102*   Lipid Profile: No results for input(s): CHOL, HDL, LDLCALC, TRIG, CHOLHDL, LDLDIRECT in the last 72 hours. Thyroid Function Tests: No results for input(s): TSH, T4TOTAL, FREET4, T3FREE, THYROIDAB in the last 72 hours. Anemia Panel: No results for input(s): VITAMINB12, FOLATE, FERRITIN, TIBC, IRON, RETICCTPCT in the last 72 hours. Sepsis Labs: No results for input(s): PROCALCITON, LATICACIDVEN in the last 168 hours.  Recent Results (from the past 240 hour(s))  MRSA PCR Screening     Status: None    Collection Time: 04/28/19  4:13 AM   Specimen: Nasopharyngeal  Result Value Ref Range Status   MRSA by PCR NEGATIVE NEGATIVE Final    Comment:        The GeneXpert MRSA Assay (FDA approved for NASAL specimens only), is one component of a comprehensive MRSA colonization surveillance program. It is not intended to diagnose MRSA infection nor to guide or monitor treatment for MRSA infections. Performed at [facility], [address], [place], [place]   Aerobic/Anaerobic Culture (surgical/deep wound)     Status: None   Collection Time: 04/28/19  9:06 AM   Specimen: PATH Cytology Misc. fluid; Body Fluid  Result Value Ref Range Status   Specimen Description WOUND PERICARDIAL FLUID  Final   Special Requests SPEC A SENT ON SWAB  Final   Gram Stain   Final    RARE WBC PRESENT, PREDOMINANTLY MONONUCLEAR NO ORGANISMS SEEN    Culture   Final    No growth aerobically or anaerobically. Performed at [facility], [address], [place], [place]    Report Status 05/03/2019 FINAL  Final  Aerobic/Anaerobic Culture (surgical/deep wound)     Status: None   Collection Time: 04/28/19 10:19 AM   Specimen: Aortic Valve Leaflets; Tissue  Result Value Ref Range Status   Specimen Description AORTIC VALVE TISSUE LEAFLETS  Final   Special Requests SPECIMEN B  Final  Gram Stain   Final    FEW WBC PRESENT,BOTH PMN AND MONONUCLEAR RARE GRAM POSITIVE COCCI    Culture   Final    No growth aerobically or anaerobically. Performed at [facility], [place] [address], [place], [place] [address]    Report Status 05/03/2019 FINAL  Final  Aerobic/Anaerobic Culture (surgical/deep wound)     Status: None   Collection Time: 04/28/19 11:54 AM   Specimen: Heart Valve Leaflet(s); Tissue  Result Value Ref Range Status   Specimen Description VALVE TRICUSPID  Final   Special Requests SPECIMEN C  Final   Gram Stain NO WBC SEEN NO ORGANISMS SEEN   Final   Culture   Final     No growth aerobically or anaerobically. Performed at [facility], [place] [address] [place], [place] [address]    Report Status 05/03/2019 FINAL  Final         Radiology Studies: DG Chest Port 1 View  Result Date: 05/06/2019 CLINICAL DATA:  Shortness of breath EXAM: PORTABLE CHEST 1 VIEW COMPARISON:  05/02/2019 FINDINGS: Prior aortic valve replacement. Cardiomegaly. Diffuse interstitial and airspace opacities, likely edema. Bilateral small to moderate pleural effusions, right greater than left. Right PICC line slightly retracted at the confluence of the innominate veins, possibly passing just into the central left innominate vein. No acute bony abnormality. IMPRESSION: Findings compatible with mild to moderate edema/CHF and bilateral effusions. Right PICC line tip at the confluence of the innominate veins, possibly crossing into the central left innominate vein. Electronically Signed   By: Rolm Baptise M.D.   On: 05/06/2019 09:25        Scheduled Meds: . aspirin EC  81 mg Oral Daily   Or  . aspirin  81 mg Per Tube Daily  . bisacodyl  10 mg Oral Daily   Or  . bisacodyl  10 mg Rectal Daily  . busPIRone  7.5 mg Oral BID  . Chlorhexidine Gluconate Cloth  6 each Topical Daily  . clopidogrel  75 mg Oral Daily  . clotrimazole  1 Applicatorful Vaginal QHS  . [facility], Patient [facility] Family Education   Does not apply Once  . docusate sodium  200 mg Oral Daily  . enoxaparin (LOVENOX) injection  30 mg Subcutaneous QHS  . escitalopram  10 mg Oral Daily  . feeding supplement (ENSURE ENLIVE)  237 mL Oral BID BM  . ferrous [id number] C-folic acid  1 capsule Oral BID PC  . furosemide  20 mg Oral Daily  . metoprolol tartrate  12.5 mg Oral BID   Or  . metoprolol tartrate  12.5 mg Per Tube BID  . pantoprazole  40 mg Oral Daily  . pneumococcal 23 valent vaccine  0.5 mL Intramuscular Tomorrow-1000  . potassium chloride  20 mEq Oral Daily  . QUEtiapine  25 mg Oral  QHS  . sodium chloride flush  3 mL Intravenous Q12H   Continuous Infusions: . sodium chloride    . sodium chloride    . cefTRIAXone (ROCEPHIN)  IV 2 g (05/06/19 1147)  . lactated ringers Stopped (04/30/19 2141)  . vancomycin 750 mg (05/06/19 0526)     LOS: 35 days    Time spent: 25 minutes    Barb Merino, MD [facility] Pager [phone]

## 2019-05-07 LAB — BASIC METABOLIC PANEL
Anion gap: 10 (ref 5–15)
BUN: 9 mg/dL (ref 6–20)
CO2: 25 mmol/L (ref 22–32)
Calcium: 8.5 mg/dL — ABNORMAL LOW (ref 8.9–10.3)
Chloride: 103 mmol/L (ref 98–111)
Creatinine, Ser: 0.71 mg/dL (ref 0.44–1.00)
GFR calc Af Amer: >60 mL/min (ref >60)
GFR calc non Af Amer: >60 mL/min (ref >60)
Glucose, Bld: 120 mg/dL — ABNORMAL HIGH (ref 70–99)
Potassium: 4 mmol/L (ref 3.5–5.1)
Sodium: 138 mmol/L (ref 135–145)

## 2019-05-07 LAB — CBC
HCT: 25.8 % — ABNORMAL LOW (ref 36.0–46.0)
Hemoglobin: 8 g/dL — ABNORMAL LOW (ref 12.0–15.0)
MCH: 27.2 pg (ref 26.0–34.0)
MCHC: 31 g/dL (ref 30.0–36.0)
MCV: 87.8 fL (ref 80.0–100.0)
Platelets: 487 10*3/uL — ABNORMAL HIGH (ref 150–400)
RBC: 2.94 MIL/uL — ABNORMAL LOW (ref 3.87–5.11)
RDW: 18.5 % — ABNORMAL HIGH (ref 11.5–15.5)
WBC: 6.6 10*3/uL (ref 4.0–10.5)
nRBC: 0 % (ref 0.0–0.2)

## 2019-05-07 MED ORDER — OXYCODONE HCL 5 MG PO TABS
5.0000 mg | ORAL_TABLET | Freq: Three times a day (TID) | ORAL | Status: DC | PRN
  Administered 2019-05-07 – 2019-05-09 (×3): 5 mg via ORAL
  Filled 2019-05-07 (×3): qty 1

## 2019-05-07 NOTE — Progress Notes (Signed)
CARDIAC REHAB PHASE I   PRE:  Rate/Rhythm: 106 ST  BP:  Supine: 96/73  Sitting:   Standing:    SaO2: 98%RA  MODE:  Ambulation: 640 ft   POST:  Rate/Rhythm: 131 ST  BP:  Supine:   Sitting: 116/84  Standing:    SaO2: 98%RA 0800-0830 Pt walked 640 ft on RA with hand held asst with steady gait. Very motivated to walk. Tolerated well.    Luetta Nutting, RN BSN  05/07/2019 8:38 AM

## 2019-05-07 NOTE — Progress Notes (Signed)
Pt's MEWS noted to turn yellow s/t slightly elevated temp 100.6. Tylenol administered with am medications.  Will monitor closely and recheck vitals in 1 hour.

## 2019-05-07 NOTE — Plan of Care (Signed)
  Problem: Education: Goal: Knowledge of disease or condition will improve Outcome: Progressing   Problem: Clinical Measurements: Goal: Ability to maintain clinical measurements within normal limits will improve Outcome: Progressing   Problem: Activity: Goal: Risk for activity intolerance will decrease Outcome: Progressing   Problem: Elimination: Goal: Will not experience complications related to bowel motility Outcome: Progressing   Problem: Pain Managment: Goal: General experience of comfort will improve Outcome: Progressing   Problem: Skin Integrity: Goal: Risk for impaired skin integrity will decrease Outcome: Progressing

## 2019-05-07 NOTE — Progress Notes (Signed)
[address]       [place] [address]             [phone]      9 Days Post-Op Procedure(s) (LRB): AORTIC VALVE REPLACEMENT (AVR) using Edwards PERIMOUNT Magna Ease Pericardial Bioprosthesis - 29 MM Aortic Valve. (N/A) REPAIR OF TRICUSPID (N/A) TRANSESOPHAGEAL ECHOCARDIOGRAM (TEE) (N/A) Subjective: Feels okay this morning. Sleepy since she is not sleeping well at night. Plans to walk this morning.   Objective: Vital signs in last 24 hours: Temp:  [98 F (36.7 C)-100.4 F (38 C)] 98.2 F (36.8 C) (04/29 0613) Pulse Rate:  [100-115] 114 (04/29 5956) Cardiac Rhythm: Sinus tachycardia (04/28 1900) Resp:  [18] 18 (04/28 2345) BP: (102-129)/(72-91) 106/78 (04/29 0613) SpO2:  [95 %-100 %] 100 % (04/29 3875) Weight:  [75.8 kg] 75.8 kg (04/29 0610)    Intake/Output from previous day: 04/28 0701 - 04/29 0700 In: 1254.6 [P.O.:500; I.V.:10; IV Piggyback:744.6] Out: -  Intake/Output this shift: No intake/output data recorded.  General appearance: alert, cooperative and no distress Heart: regular rate and rhythm, S1, S2 normal, no murmur, click, rub or gallop Lungs: clear to auscultation bilaterally Abdomen: soft, non-tender; bowel sounds normal; no masses,  no organomegaly Extremities: 1+ nonpitting edema in lower ext Wound: clean and dry  Lab Results: Recent Labs    05/06/19 0438 05/07/19 0500  WBC 6.0 6.6  HGB 7.8* 8.0*  HCT 24.7* 25.8*  PLT 437* 487*   BMET:  Recent Labs    05/06/19 0438 05/07/19 0500  NA 138 138  K 3.9 4.0  CL 106 103  CO2 21* 25  GLUCOSE 95 120*  BUN 9 9  CREATININE 0.63 0.71  CALCIUM 8.6* 8.5*    PT/INR: No results for input(s): LABPROT, INR in the last 72 hours. ABG    Component Value Date/Time   PHART 7.365 04/28/2019 2205   HCO3 24.5 04/28/2019 2205   TCO2 26 04/28/2019 2205   ACIDBASEDEF 1.0 04/28/2019 2205   O2SAT 97.0 04/28/2019 2205   CBG (last 3)  No results for input(s): GLUCAP in the last 72  hours.  Assessment/Plan: S/P Procedure(s) (LRB): AORTIC VALVE REPLACEMENT (AVR) using Edwards PERIMOUNT Magna Ease Pericardial Bioprosthesis - 29 MM Aortic Valve. (N/A) REPAIR OF TRICUSPID (N/A) TRANSESOPHAGEAL ECHOCARDIOGRAM (TEE) (N/A)  1. CV -Sinus tachycardia. On Lopressor 12.5 mg bid-BP too low to titrate. 2. Pulmonary - On room air. CXR showed increased pleural effusion on the right 3. Volume Overload -increase oral lasix 40mg  daily. Added metolazone.Continue daily weights. Creatinine 0.71, electrolytes okay. 4. Expected post op acute blood loss anemia - H and H this8.0/25.8Continue Trinsicon. 5. ID-on Vancomycin and Ceftriaxone, GPC on gram stain. Patient will need 6 weeks of IV antibiotic (end date 05/31) 6. Neurology-CVA.DAPT (asa and plavix) recommended for 3-6 months but Dr. 6/31 to determine exact regimen and patient will need to follow up in the stroke clinic with Dr. Corliss Skains after discharge. 7. GI-per speech pathology, regular;Thin liquid Liquid Administration via: Cup;Straw Medication Administration: Whole meds with puree Supervision: Patient able to self feed 8. History of poly stubstance abuse-will need assistance with rehab. change oxycodone to q 8 hours PRN.  9. Right DVT, groin hematoma-s/p IVC filter 03/26 10.Yeast infection-Diflucan for one doseon 4/25and then Monistat   Plan: Changed oxycodone 5mg  to q 8 hours PRN. Continue diuresis for fluid overload. Lasix increased and metolazone added yesterday. Monitor weight closely. Benadryl PRN at night for insomnia.    LOS: 36  days    Elgie Collard 05/07/2019

## 2019-05-07 NOTE — Progress Notes (Signed)
PROGRESS NOTE    Ralph Daleiza Bacchi  WUJ:[id number] DOB: 09-27-1990 DOA: 04/01/2019 PCP: Patient, No Pcp Per    Brief Narrative:  29 year old female with past medical history with substance abuse with heroin, crystal meth and cocaine as well as smoking presents to the ER with aphasia and right-sided weakness and code stroke.  Admitted to neurology ICU.  Patient was intubated underwent thrombectomy for left MCA M3 occlusion and recanalization.  This was complicated by dissection and pseudoaneurysm of the left ICA SP stent placement. Extubated on April 04, 2019.  Also had a DVT but treated with IVC filter.  TEE showed infective endocarditis. S/P bioprosthetic aortic valve replacement and tricuspid valve repair on 4/20 Currently further plan is continue IV antibiotics for endocarditis until 5/31  4/28-4/29: Patient has low-grade temperature 100 degree and tachycardia.  Blood cultures drawn on 4/28.  If persistent fever, will discuss with infectious disease.   Assessment & Plan:   Active Problems:   Acute ischemic left MCA stroke (HCC)   Middle cerebral artery embolism, left   DVT (deep venous thrombosis) (HCC)   Encounter for central line placement   Endotracheal tube present   Cerebrovascular accident (CVA) due to embolism of precerebral artery (HCC)   Infective endocarditis   Normocytic anemia   Polysubstance abuse (HCC)   IVDU (intravenous drug user)   Tachypnea   Endocarditis  1.  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 Severe AR. SP aortic valve replacement with bioprosthetic valve and tricuspid repair. MSSA in sputum.  Suspected CMV infection Initially presented with code stroke, intubated and admitted to the neuro ICU. Tracheal aspirate grew MSSA. So far tissue culture with no growth to date. Remains on IV vancomycin and IV ceftriaxone. 6 weeks of antibiotics post surgery through Jun 08, 2019. On the path report staining looks like CMV in some areas.   Followed by ID. PICC line inserted. Patient will remain in a supervised setting for completion of the antibiotics. Repeat cultures 4/28.  Low-grade temperature, will monitor.  2.  Aortic valve endocarditis. SP aortic valve replacement 04/28/2019 and tricuspid repair Cardiothoracic surgery following.  3.  Acute CVA of the MCA territory.  Embolic in nature. TPA was deferred as concern for septic emboli. SP left common carotid arteriogram with complete revascularization of the occluded M3. Complicated by dissection and pseudoaneurysm of the left ICA S/P placement of 4.5 mm x 25 mm pipeline flow diverter stent. Initially was on aspirin and Brilinta.  Brilinta was held for cardiothoracic surgery. Patient has been off Brilinta but on aspirin for cardiothoracic surgery. Patient has intracranial stenting therefore will require dual antiplatelet therapy with aspirin and Plavix per neurology. Continue this for 3 to 6 months. Patient will follow up with Dr. Corliss Skains and decide on exact regimen. Patient will also require follow-up with neurology.  4.  Right common femoral DVT, Right groin hematoma, IVC filter placement April 03, 2019. During the hospitalization patient was found to have right common femoral DVT. Patient also was found to have large right groin hematoma post vascular intervention Decision was made to place IVC filter placement since the patient was felt not a candidate for therapeutic anticoagulation given her septic emboli, dissection as well as hematoma.  5.  Acute blood loss anemia. Iron deficiency anemia. B12 deficiency. Folate deficiency. Continue supplementation. H&H relatively stable. No active bleeding identified. No GI bleed. Hemoglobin is stable.  6.  Polysubstance abuse. UDS positive for THC, amphetamine admission. Suspected IV drug use as well with needle marks.  Currently on oral pain medication. Out of the window for any withdrawals.  7.   Anxiety. Currently on BuSpar, Lexapro, Seroquel.  May not require Seroquel at discharge. As needed Ativan.  8.  Hepatitis C antibody positive. Negative viral load. HIV screen negative. RPR negative. Will require outpatient follow-up and therapy.  9.  Pain control. On as needed oxycodone.  Suggested patient to use Tylenol and decrease dose of oxycodone.   DVT prophylaxis: Heparin subcu Code Status: Full code Family Communication: None Disposition Plan: Status is: Inpatient  Remains inpatient appropriate because:IV treatments appropriate due to intensity of illness or inability to take PO   Dispo: The patient is from: Home              Anticipated d/c is to: Home              Anticipated d/c date is: > 3 days              Patient currently is not medically stable to d/c.          Consultants:   CT surgery  Infectious disease  Procedures:   Aortic valve replacement, 4/20  Antimicrobials:  Anti-infectives (From admission, onward)   Start     Dose/Rate Route Frequency Ordered Stop   05/03/19 1000  fluconazole (DIFLUCAN) tablet 150 mg     150 mg Oral  Once 05/03/19 0804 05/03/19 1243   04/28/19 0400  vancomycin (VANCOREADY) IVPB 1250 mg/250 mL  Status:  Discontinued     1,250 mg 166.7 mL/hr over 90 Minutes Intravenous To Surgery 04/27/19 1131 04/28/19 1451   04/28/19 0400  cefUROXime (ZINACEF) 1.5 g in sodium chloride 0.9 % 100 mL IVPB     1.5 g 200 mL/hr over 30 Minutes Intravenous To Surgery 04/27/19 1131 04/28/19 0819   04/28/19 0400  cefUROXime (ZINACEF) 750 mg in sodium chloride 0.9 % 100 mL IVPB     750 mg 200 mL/hr over 30 Minutes Intravenous To Surgery 04/27/19 1131 04/28/19 1313   04/15/19 0900  vancomycin (VANCOREADY) IVPB 750 mg/150 mL     750 mg 150 mL/hr over 60 Minutes Intravenous Every 8 hours 04/15/19 0821     04/13/19 1400  vancomycin (VANCOCIN) IVPB 1000 mg/200 mL premix  Status:  Discontinued     1,000 mg 200 mL/hr over 60 Minutes  Intravenous Every 8 hours 04/13/19 0839 04/15/19 0821   04/12/19 2200  vancomycin (VANCOREADY) IVPB 750 mg/150 mL  Status:  Discontinued     750 mg 150 mL/hr over 60 Minutes Intravenous Every 8 hours 04/12/19 1500 04/13/19 0839   04/10/19 1336  vancomycin (VANCOCIN) IVPB 1000 mg/200 mL premix  Status:  Discontinued     1,000 mg 200 mL/hr over 60 Minutes Intravenous Every 8 hours 04/10/19 1320 04/12/19 1500   04/08/19 1200  cefTRIAXone (ROCEPHIN) 2 g in sodium chloride 0.9 % 100 mL IVPB     2 g 200 mL/hr over 30 Minutes Intravenous Every 12 hours 04/08/19 1013 06/09/19 2359   04/08/19 0530  vancomycin (VANCOCIN) IVPB 1000 mg/200 mL premix  Status:  Discontinued     1,000 mg 200 mL/hr over 60 Minutes Intravenous Every 12 hours 04/07/19 1541 04/10/19 1320   04/07/19 1900  Ampicillin-Sulbactam (UNASYN) 3 g in sodium chloride 0.9 % 100 mL IVPB  Status:  Discontinued     3 g 200 mL/hr over 30 Minutes Intravenous Every 6 hours 04/07/19 1457 04/08/19 1013   04/07/19 1730  vancomycin (VANCOREADY) IVPB 1500  mg/300 mL     1,500 mg 150 mL/hr over 120 Minutes Intravenous  Once 04/07/19 1541 04/07/19 1937   04/04/19 1400  ceFAZolin (ANCEF) IVPB 2g/100 mL premix  Status:  Discontinued     2 g 200 mL/hr over 30 Minutes Intravenous Every 8 hours 04/04/19 1119 04/07/19 1457   04/04/19 0400  Vancomycin (VANCOCIN) 1,250 mg in sodium chloride 0.9 % 250 mL IVPB  Status:  Discontinued     1,250 mg 166.7 mL/hr over 90 Minutes Intravenous Every 12 hours 04/03/19 1635 04/04/19 1159   04/02/19 1800  ceFEPIme (MAXIPIME) 2 g in sodium chloride 0.9 % 100 mL IVPB  Status:  Discontinued     2 g 200 mL/hr over 30 Minutes Intravenous Every 8 hours 04/02/19 1218 04/04/19 1127   04/02/19 1600  vancomycin (VANCOCIN) IVPB 1000 mg/200 mL premix  Status:  Discontinued     1,000 mg 200 mL/hr over 60 Minutes Intravenous Every 12 hours 04/02/19 0100 04/03/19 1635   04/02/19 0200  piperacillin-tazobactam (ZOSYN) IVPB 3.375 g   Status:  Discontinued     3.375 g 12.5 mL/hr over 240 Minutes Intravenous Every 8 hours 04/02/19 0059 04/02/19 1219   04/02/19 0200  vancomycin (VANCOCIN) 1,500 mg in sodium chloride 0.9 % 500 mL IVPB     1,500 mg 250 mL/hr over 120 Minutes Intravenous  Once 04/02/19 0100 04/02/19 0923   04/01/19 2040  ceFAZolin (ANCEF) 2-4 GM/100ML-% IVPB    Note to Pharmacy: [name]   : cabinet override      04/01/19 2040 04/02/19 0844         Subjective: Seen and examined.  Overnight temperature 100.6.  Appropriate sinus tachycardia in telemetry.  Patient herself has no complaints.  She is happy to walk around.  Objective: Vitals:   05/07/19 0610 05/07/19 0613 05/07/19 0823 05/07/19 0937  BP:  106/78 116/84 95/64  Pulse:  (!) 114 (!) 104 94  Resp:   18 16  Temp:  98.2 F (36.8 C) (!) 100.6 F (38.1 C) 97.8 F (36.6 C)  TempSrc:  Oral Oral Oral  SpO2:  100% 100% 100%  Weight: 75.8 kg     Height:        Intake/Output Summary (Last 24 hours) at 05/07/2019 1013 Last data filed at 05/07/2019 0250 Gross per 24 hour  Intake 994.61 ml  Output --  Net 994.61 ml   Filed Weights   05/05/19 0556 05/06/19 0437 05/07/19 0610  Weight: 77.3 kg 77.3 kg 75.8 kg    Examination:  General exam: Appears calm and comfortable, on room air Respiratory system: Clear to auscultation. Respiratory effort normal.  Midline surgical scar looks clean and nontender. Cardiovascular system: S1 & S2 heard, RRR.  Central nervous system: Alert and oriented. No focal neurological deficits. Extremities: Symmetric 5 x 5 power. Skin: No rashes, lesions or ulcers PICC line looks clean and nontender. No joint tenderness.    Data Reviewed: I have personally reviewed following labs and imaging studies  CBC: Recent Labs  Lab 05/02/19 0340 05/04/19 0400 05/05/19 0320 05/06/19 0438 05/07/19 0500  WBC 6.1 6.0 5.2 6.0 6.6  HGB 7.3* 7.7* 7.6* 7.8* 8.0*  HCT 23.4* 24.7* 25.1* 24.7* 25.8*  MCV 86.7 87.0 87.8  86.1 87.8  PLT 234 345 376 437* 637*   Basic Metabolic Panel: Recent Labs  Lab 05/02/19 0340 05/03/19 0502 05/05/19 0320 05/06/19 0438 05/07/19 0500  NA 137 137 135 138 138  K 3.2* 3.8 3.9 3.9 4.0  CL 98 102 104 106 103  CO2 32 28 24 21* 25  GLUCOSE 92 95 100* 95 120*  BUN 16 10 8 9 9   CREATININE 0.65 0.52 0.69 0.63 0.71  CALCIUM 8.3* 8.5* 8.5* 8.6* 8.5*  MG 1.6* 1.9  --   --   --    GFR: Estimated Creatinine Clearance: 107 mL/min (by C-G formula based on SCr of 0.71 mg/dL). Liver Function Tests: Recent Labs  Lab 05/01/19 0412 05/02/19 0340  AST 23 21  ALT 20 20  ALKPHOS 82 74  BILITOT 0.3 0.7  PROT 5.4* 5.0*  ALBUMIN 2.3* 2.2*   No results for input(s): LIPASE, AMYLASE in the last 168 hours. No results for input(s): AMMONIA in the last 168 hours. Coagulation Profile: No results for input(s): INR, PROTIME in the last 168 hours. Cardiac Enzymes: No results for input(s): CKTOTAL, CKMB, CKMBINDEX, TROPONINI in the last 168 hours. BNP (last 3 results) No results for input(s): PROBNP in the last 8760 hours. HbA1C: No results for input(s): HGBA1C in the last 72 hours. CBG: Recent Labs  Lab 05/01/19 1103 05/01/19 1534 05/01/19 2149 05/02/19 0618 05/02/19 2130  GLUCAP 89 111* 105* 92 102*   Lipid Profile: No results for input(s): CHOL, HDL, LDLCALC, TRIG, CHOLHDL, LDLDIRECT in the last 72 hours. Thyroid Function Tests: No results for input(s): TSH, T4TOTAL, FREET4, T3FREE, THYROIDAB in the last 72 hours. Anemia Panel: No results for input(s): VITAMINB12, FOLATE, FERRITIN, TIBC, IRON, RETICCTPCT in the last 72 hours. Sepsis Labs: No results for input(s): PROCALCITON, LATICACIDVEN in the last 168 hours.  Recent Results (from the past 240 hour(s))  MRSA PCR Screening     Status: None   Collection Time: 04/28/19  4:13 AM   Specimen: Nasopharyngeal  Result Value Ref Range Status   MRSA by PCR NEGATIVE NEGATIVE Final    Comment:        The GeneXpert MRSA Assay  (FDA approved for NASAL specimens only), is one component of a comprehensive MRSA colonization surveillance program. It is not intended to diagnose MRSA infection nor to guide or monitor treatment for MRSA infections. Performed at [facility], [address] [place], [place]   Aerobic/Anaerobic Culture (surgical/deep wound)     Status: None   Collection Time: 04/28/19  9:06 AM   Specimen: PATH Cytology Misc. fluid; Body Fluid  Result Value Ref Range Status   Specimen Description WOUND PERICARDIAL FLUID  Final   Special Requests SPEC A SENT ON SWAB  Final   Gram Stain   Final    RARE WBC PRESENT, PREDOMINANTLY MONONUCLEAR NO ORGANISMS SEEN    Culture   Final    No growth aerobically or anaerobically. Performed at [facility], [address] [place], [place]    Report Status 05/03/2019 FINAL  Final  Aerobic/Anaerobic Culture (surgical/deep wound)     Status: None   Collection Time: 04/28/19 10:19 AM   Specimen: Aortic Valve Leaflets; Tissue  Result Value Ref Range Status   Specimen Description AORTIC VALVE TISSUE LEAFLETS  Final   Special Requests SPECIMEN B  Final   Gram Stain   Final    FEW WBC PRESENT,BOTH PMN AND MONONUCLEAR RARE GRAM POSITIVE COCCI    Culture   Final    No growth aerobically or anaerobically. Performed at [facility], [address], [place], [place]    Report Status 05/03/2019 FINAL  Final  Aerobic/Anaerobic Culture (surgical/deep wound)  Status: None   Collection Time: 04/28/19 11:54 AM   Specimen: Heart Valve Leaflet(s); Tissue  Result Value Ref Range Status   Specimen Description VALVE TRICUSPID  Final   Special Requests SPECIMEN C  Final   Gram Stain NO WBC SEEN NO ORGANISMS SEEN   Final   Culture   Final    No growth aerobically or anaerobically. Performed at [facility], [address], [place], [place] [address]    Report Status 05/03/2019 FINAL  Final  Culture, blood  (routine x 2)     Status: None (Preliminary result)   Collection Time: 05/06/19 12:14 PM   Specimen: BLOOD LEFT HAND  Result Value Ref Range Status   Specimen Description BLOOD LEFT HAND  Final   Special Requests   Final    BOTTLES DRAWN AEROBIC ONLY Blood Culture results may not be optimal due to an inadequate volume of blood received in culture bottles   Culture   Final    NO GROWTH < 24 HOURS Performed at [facility], [address], [place], [place] [address]    Report Status PENDING  Incomplete  Culture, blood (routine x 2)     Status: None (Preliminary result)   Collection Time: 05/06/19 12:32 PM   Specimen: BLOOD LEFT HAND  Result Value Ref Range Status   Specimen Description BLOOD LEFT HAND  Final   Special Requests   Final    BOTTLES DRAWN AEROBIC ONLY Blood Culture results may not be optimal due to an inadequate volume of blood received in culture bottles   Culture   Final    NO GROWTH < 24 HOURS Performed at [facility], [address], [place], [place] [address]    Report Status PENDING  Incomplete         Radiology Studies: DG Chest Port 1 View  Result Date: 05/06/2019 CLINICAL DATA:  Shortness of breath EXAM: PORTABLE CHEST 1 VIEW COMPARISON:  05/02/2019 FINDINGS: Prior aortic valve replacement. Cardiomegaly. Diffuse interstitial and airspace opacities, likely edema. Bilateral small to moderate pleural effusions, right greater than left. Right PICC line slightly retracted at the confluence of the innominate veins, possibly passing just into the central left innominate vein. No acute bony abnormality. IMPRESSION: Findings compatible with mild to moderate edema/CHF and bilateral effusions. Right PICC line tip at the confluence of the innominate veins, possibly crossing into the central left innominate vein. Electronically Signed   By: Charlett Nose M.D.   On: 05/06/2019 09:25        Scheduled Meds: . aspirin EC  81 mg Oral Daily   Or  . aspirin  81  mg Per Tube Daily  . bisacodyl  10 mg Oral Daily   Or  . bisacodyl  10 mg Rectal Daily  . busPIRone  7.5 mg Oral BID  . Chlorhexidine Gluconate Cloth  6 each Topical Daily  . clopidogrel  75 mg Oral Daily  . clotrimazole  1 Applicatorful Vaginal QHS  . [facility] Patient & Family Education   Does not apply Once  . docusate sodium  200 mg Oral Daily  . enoxaparin (LOVENOX) injection  30 mg Subcutaneous QHS  . escitalopram  10 mg Oral Daily  . feeding supplement (ENSURE ENLIVE)  237 mL Oral BID BM  . ferrous fumarate-b12-vitamic C-folic acid  1 capsule Oral BID PC  . furosemide  40 mg Oral Daily  . metolazone  2.5 mg Oral Daily  . metoprolol  tartrate  12.5 mg Oral BID   Or  . metoprolol tartrate  12.5 mg Per Tube BID  . pantoprazole  40 mg Oral Daily  . pneumococcal 23 valent vaccine  0.5 mL Intramuscular Tomorrow-1000  . potassium chloride  20 mEq Oral TID  . QUEtiapine  25 mg Oral QHS  . sodium chloride flush  3 mL Intravenous Q12H   Continuous Infusions: . sodium chloride    . sodium chloride    . cefTRIAXone (ROCEPHIN)  IV 2 g (05/07/19 1001)  . lactated ringers Stopped (04/30/19 2141)  . vancomycin 750 mg (05/07/19 0504)     LOS: 36 days    Time spent: 25 minutes    Dorcas Carrow, MD [facility] Pager [phone]

## 2019-05-07 NOTE — Progress Notes (Signed)
Nutrition Follow-up  DOCUMENTATION CODES:   Not applicable  INTERVENTION:   -Continue Ensure Enlive po BID, each supplement provides 350 kcal and 20 grams of protein -Continue MVI with minerals daily  NUTRITION DIAGNOSIS:   Increased nutrient needs related to acute illness as evidenced by estimated needs.  Ongoing  GOAL:   Patient will meet greater than or equal to 90% of their needs  Progressing   MONITOR:   I & O's, Labs, Supplement acceptance, PO intake, Weight trends  REASON FOR ASSESSMENT:   Ventilator    ASSESSMENT:   Pt with suspected IV drug abuse who while being interviewed by police developed left-sided weakness admitted with L MCA s/p IR for revascularization and L ICA stent.  3/24 - OG tube placed, intubated 3/27 - OG removed, extubated 3/29 - Regular diet ordered 3/30 - s/p TEE 4/20 - endocaditis with AV replacement, tricuspid valve repair, TEE  Reviewed I/O's: +1.3 L x 24 hours and +5.1 L since 04/23/19  Observed meal tray- noted she consumed 75% of breakfast. Meal completion documented at 75-100%.   Spoke with pt at bedside, who was pleasant and in good spirits today. She reports her appetite has improved greatly post-operatively. She shares she really enjoys eating and drinking at baseline and has been trying to consume at least 3/4 of her tray. She is drinking her Ensure supplements; "they're not my favorite but I appreciate the value of them and how they can help me get better". Pt very motivated and "wants to do everything to help me get better". She reports feeling stronger everyday and is walking daily.   Discussed importance of good meal and supplement intake to promote healing.   Per MD notes, pt to remain in hospital until completion of IV antibiotics.   Labs reviewed.   Diet Order:   Diet Order            Diet 2 gram sodium Room service appropriate? Yes; Fluid consistency: Thin  Diet effective now              EDUCATION NEEDS:    No education needs have been identified at this time  Skin:  Skin Assessment: Reviewed RN Assessment  Last BM:  05/05/19  Height:   Ht Readings from Last 1 Encounters:  05/01/19 5\' 6"  (1.676 m)    Weight:   Wt Readings from Last 1 Encounters:  05/07/19 75.8 kg    Ideal Body Weight:  56.8 kg  BMI:  Body mass index is 26.99 kg/m.  Estimated Nutritional Needs:   Kcal:  1900-2100  Protein:  100-115 grams  Fluid:  > 1.9 L/day    05/09/19, RD, LDN, CDCES Registered Dietitian II Certified Diabetes Care and Education Specialist Please refer to [facility] for RD and/or RD on-call/weekend/after hours pager

## 2019-05-07 NOTE — Progress Notes (Signed)
Physical Therapy Treatment Patient Details Name: Lauren Reyes MRN: [id number] DOB: 06/11/1990 Today's Date: 05/07/2019    History of Present Illness Pt is 29 year old female who was admitted with suspected IV drug abuse was being interviewed by police and developed right-sided facial weakness, aphasia, and R weakness.  She was brought by EMS to the emergency room work-up revealed occluded left MCA. Revascularization performed by IR. CT head and neck demonstrate multiple small cavitary abscesses in the upper lobes bilaterally. IVC filter placement for RLE DVT on 3/26. Pt on antibiotics for multivalvular endocarditis.  Underwent AVR and tricuspid valve repair on 04/28/19.    PT Comments    Pt doing well with mobility. Will practice stairs next visit and then likely sign-off for PT.    Follow Up Recommendations  No PT follow up     Equipment Recommendations  None recommended by PT    Recommendations for Other Services       Precautions / Restrictions Precautions Precautions: Sternal    Mobility  Bed Mobility               General bed mobility comments: Pt up in bathroom  Transfers Overall transfer level: Modified independent Equipment used: None Transfers: Sit to/from [employer] Transfers Sit to Stand: Independent            Ambulation/Gait Ambulation/Gait assistance: Modified independent (Device/Increase time) Gait Distance (Feet): 600 Feet Assistive device: None Gait Pattern/deviations: Step-through pattern;Decreased stride length;Shuffle Gait velocity: decr Gait velocity interpretation: 1.31 - 2.62 ft/sec, indicative of limited community ambulator General Gait Details: Pt amb with steady gait without assistive device.    Stairs             Wheelchair Mobility    Modified Rankin (Stroke Patients Only) Modified Rankin (Stroke Patients Only) Pre-Morbid Rankin Score: No symptoms Modified Rankin: Moderate disability     Balance  Overall balance assessment: Needs assistance Sitting-balance support: No upper extremity supported;Feet supported Sitting balance-Leahy Scale: Normal     Standing balance support: No upper extremity supported;During functional activity Standing balance-Leahy Scale: Good                              Cognition Arousal/Alertness: Awake/alert Behavior During Therapy: WFL for tasks assessed/performed Overall Cognitive Status: Within Functional Limits for tasks assessed                                        Exercises      General Comments General comments (skin integrity, edema, etc.): VSS      Pertinent Vitals/Pain Pain Assessment: No/denies pain    Home Living                      Prior Function            PT Goals (current goals can now be found in the care plan section) Progress towards PT goals: Progressing toward goals;Goals met and updated - see care plan    Frequency    Min 3X/week      PT Plan Discharge plan needs to be updated    Co-evaluation              AM-PAC PT "6 Clicks" Mobility   Outcome Measure  Help needed turning from your back to your side while in a flat bed without using  bedrails?: None Help needed moving from lying on your back to sitting on the side of a flat bed without using bedrails?: None Help needed moving to and from a bed to a chair (including a wheelchair)?: None Help needed standing up from a chair using your arms (e.g., wheelchair or bedside chair)?: None Help needed to walk in hospital room?: None Help needed climbing 3-5 steps with a railing? : A Little 6 Click Score: 23    End of Session Equipment Utilized During Treatment: Gait belt Activity Tolerance: Patient tolerated treatment well Patient left: with call bell/phone within reach;Other (comment)(standing in room)   PT Visit Diagnosis: Other abnormalities of gait and mobility (R26.89);Pain     Time: 1140-1152 PT Time  Calculation (min) (ACUTE ONLY): 12 min  Charges:  $Gait Training: 8-22 mins                     [place] Pager [phone] Office [place] 05/07/2019, 1:27 PM

## 2019-05-08 ENCOUNTER — Inpatient Hospital Stay (HOSPITAL_COMMUNITY): Payer: Medicaid Other

## 2019-05-08 LAB — CBC
HCT: 26.8 % — ABNORMAL LOW (ref 36.0–46.0)
Hemoglobin: 8.3 g/dL — ABNORMAL LOW (ref 12.0–15.0)
MCH: 27 pg (ref 26.0–34.0)
MCHC: 31 g/dL (ref 30.0–36.0)
MCV: 87.3 fL (ref 80.0–100.0)
Platelets: 568 10*3/uL — ABNORMAL HIGH (ref 150–400)
RBC: 3.07 MIL/uL — ABNORMAL LOW (ref 3.87–5.11)
RDW: 18.5 % — ABNORMAL HIGH (ref 11.5–15.5)
WBC: 6.8 10*3/uL (ref 4.0–10.5)
nRBC: 0 % (ref 0.0–0.2)

## 2019-05-08 LAB — BASIC METABOLIC PANEL
Anion gap: 8 (ref 5–15)
BUN: 7 mg/dL (ref 6–20)
CO2: 26 mmol/L (ref 22–32)
Calcium: 8.7 mg/dL — ABNORMAL LOW (ref 8.9–10.3)
Chloride: 103 mmol/L (ref 98–111)
Creatinine, Ser: 0.64 mg/dL (ref 0.44–1.00)
GFR calc Af Amer: >60 mL/min (ref >60)
GFR calc non Af Amer: >60 mL/min (ref >60)
Glucose, Bld: 92 mg/dL (ref 70–99)
Potassium: 4 mmol/L (ref 3.5–5.1)
Sodium: 137 mmol/L (ref 135–145)

## 2019-05-08 NOTE — Progress Notes (Signed)
Physical Therapy Treatment Patient Details Name: Lauren Reyes MRN: [id number] DOB: Jun 01, 1990 Today's Date: 05/08/2019    History of Present Illness Pt is 29 year old female who was admitted with suspected IV drug abuse was being interviewed by police and developed right-sided facial weakness, aphasia, and R weakness.  She was brought by EMS to the emergency room work-up revealed occluded left MCA. Revascularization performed by IR. CT head and neck demonstrate multiple small cavitary abscesses in the upper lobes bilaterally. IVC filter placement for RLE DVT on 3/26. Pt on antibiotics for multivalvular endocarditis.  Underwent AVR and tricuspid valve repair on 04/28/19.    PT Comments    Patient mobilizing well unaided even on steps with railing.  She had one episode of cross step for balance catch likely some hip weakness.  Issued HEP as noted below.  Patient has met PT goals.  Will sign off.  Recommend continue cardiac rehab.    Follow Up Recommendations  No PT follow up     Equipment Recommendations  None recommended by PT    Recommendations for Other Services       Precautions / Restrictions Precautions Precautions: Sternal    Mobility  Bed Mobility Overal bed mobility: Modified Independent                Transfers Overall transfer level: Modified independent   Transfers: Sit to/from Stand Sit to Stand: Independent            Ambulation/Gait Ambulation/Gait assistance: Independent Gait Distance (Feet): 400 Feet Assistive device: None Gait Pattern/deviations: Step-through pattern;Decreased stride length     General Gait Details: good pace, no LOB   Stairs Stairs: Yes Stairs assistance: Supervision Stair Management: One rail Right;Step to pattern Number of Stairs: 4 General stair comments: step to pattern, no LOB, S for safety   Wheelchair Mobility    Modified Rankin (Stroke Patients Only) Modified Rankin (Stroke Patients  Only) Pre-Morbid Rankin Score: No symptoms Modified Rankin: Moderate disability     Balance Overall balance assessment: Modified Independent   Sitting balance-Leahy Scale: Normal       Standing balance-Leahy Scale: Good                 High Level Balance Comments: mild balance issues at door with cross step to correct            Cognition Arousal/Alertness: Awake/alert Behavior During Therapy: WFL for tasks assessed/performed Overall Cognitive Status: Within Functional Limits for tasks assessed                                        Exercises General Exercises - Lower Extremity Hip ABduction/ADduction: AROM;5 reps;Standing;Strengthening Hip Flexion/Marching: AROM;5 reps;Standing;Strengthening Heel Raises: AROM;10 reps;Standing;Strengthening Mini-Sqauts: Strengthening;5 reps(sit to stand)    General Comments        Pertinent Vitals/Pain Pain Assessment: No/denies pain    Home Living                      Prior Function            PT Goals (current goals can now be found in the care plan section) Progress towards PT goals: Goals met/education completed, patient discharged from PT    Frequency    Min 3X/week      PT Plan Current plan remains appropriate    Co-evaluation  AM-PAC PT "6 Clicks" Mobility   Outcome Measure  Help needed turning from your back to your side while in a flat bed without using bedrails?: None Help needed moving from lying on your back to sitting on the side of a flat bed without using bedrails?: None Help needed moving to and from a bed to a chair (including a wheelchair)?: None Help needed standing up from a chair using your arms (e.g., wheelchair or bedside chair)?: None Help needed to walk in hospital room?: None Help needed climbing 3-5 steps with a railing? : None 6 Click Score: 24    End of Session   Activity Tolerance: Patient tolerated treatment well Patient left:  in chair;with call bell/phone within reach   PT Visit Diagnosis: Other abnormalities of gait and mobility (R26.89)     Time: 2548-6282 PT Time Calculation (min) (ACUTE ONLY): 24 min  Charges:  $Gait Training: 8-22 mins $Therapeutic Exercise: 8-22 mins                     Lauren Reyes, [facility] [phone] 05/08/2019    Lauren Reyes 05/08/2019, 4:57 PM

## 2019-05-08 NOTE — Progress Notes (Signed)
Pt ambulated x 1000 feet  Around the unit independently will continue to monitor

## 2019-05-08 NOTE — Progress Notes (Signed)
Pt has now ambulated x2 (last with PT). Will f/u tomorrow. Ethelda Chick CES, ACSM 2:48 PM 05/08/2019

## 2019-05-08 NOTE — Progress Notes (Signed)
PROGRESS NOTE    Lauren Reyes  ATF:[id number] DOB: [birth year] DOA: 04/01/2019 PCP: Patient, No Pcp Per    Brief Narrative:  29 year old female with past medical history with substance abuse with heroin, crystal meth and cocaine as well as smoking presents to the ER with aphasia and right-sided weakness and code stroke.  Admitted to neurology ICU.  Patient was intubated underwent thrombectomy for left MCA M3 occlusion and recanalization.  This was complicated by dissection and pseudoaneurysm of the left ICA SP stent placement. Extubated on April 04, 2019.  Also had a DVT but treated with IVC filter.  TEE showed infective endocarditis. S/P bioprosthetic aortic valve replacement and tricuspid valve repair on 4/20 Currently further plan is continue IV antibiotics for endocarditis until 5/31  4/28-4/29: Patient has low-grade temperature 100 degree and tachycardia.  Blood cultures drawn on 4/28.  No growth so far.  Temperature trending down.  Assessment & Plan:   Active Problems:   Acute ischemic left MCA stroke (HCC)   Middle cerebral artery embolism, left   DVT (deep venous thrombosis) (HCC)   Encounter for central line placement   Endotracheal tube present   Cerebrovascular accident (CVA) due to embolism of precerebral artery (HCC)   Infective endocarditis   Normocytic anemia   Polysubstance abuse (HCC)   IVDU (intravenous drug user)   Tachypnea   Endocarditis  1.  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 Severe AR. SP aortic valve replacement with bioprosthetic valve and tricuspid repair. MSSA in sputum.  Suspected CMV infection Initially presented with code stroke, intubated and admitted to the neuro ICU. Tracheal aspirate grew MSSA. So far tissue culture with no growth to date. Remains on IV vancomycin and IV ceftriaxone. 6 weeks of antibiotics post surgery through Jun 08, 2019. On the path report staining looks like CMV in some areas.  Followed by  ID. PICC line inserted. Patient will remain in a supervised setting for completion of the antibiotics. Repeat cultures 4/28.  Low-grade temperature, will monitor.  No growth so far.  Improving.  2.  Aortic valve endocarditis. SP aortic valve replacement 04/28/2019 and tricuspid repair Cardiothoracic surgery following.  3.  Acute CVA of the MCA territory.  Embolic in nature. TPA was deferred as concern for septic emboli. SP left common carotid arteriogram with complete revascularization of the occluded M3. Complicated by dissection and pseudoaneurysm of the left ICA S/P placement of 4.5 mm x 25 mm pipeline flow diverter stent. Initially was on aspirin and Brilinta.  Brilinta was held for cardiothoracic surgery. Patient has been off Brilinta but on aspirin for cardiothoracic surgery. Patient has intracranial stenting therefore will require dual antiplatelet therapy with aspirin and Plavix per neurology. Continue this for 3 to 6 months. Patient will follow up with Dr. Corliss Skains and decide on exact regimen. Patient will also require follow-up with neurology.  4.  Right common femoral DVT, Right groin hematoma, IVC filter placement April 03, 2019. During the hospitalization patient was found to have right common femoral DVT. Patient also was found to have large right groin hematoma post vascular intervention Decision was made to place IVC filter placement since the patient was felt not a candidate for therapeutic anticoagulation given her septic emboli, dissection as well as hematoma.  5.  Acute blood loss anemia. Iron deficiency anemia. B12 deficiency. Folate deficiency. Continue supplementation. H&H relatively stable. No active bleeding identified. No GI bleed. Hemoglobin is stable.  6.  Polysubstance abuse. UDS positive for THC, amphetamine admission. Suspected IV drug  use as well with needle marks. Currently on oral pain medication. Out of the window for any withdrawals.  7.   Anxiety. Currently on BuSpar, Lexapro, Seroquel.  May not require Seroquel at discharge. As needed Ativan.  8.  Hepatitis C antibody positive. Negative viral load. HIV screen negative. RPR negative. Will require outpatient follow-up and therapy.  9.  Pain control. On as needed oxycodone.  Suggested patient to use Tylenol and decrease dose of oxycodone.   DVT prophylaxis: Heparin subcu Code Status: Full code Family Communication: None Disposition Plan: Status is: Inpatient  Remains inpatient appropriate because:IV treatments appropriate due to intensity of illness or inability to take PO   Dispo: The patient is from: Home              Anticipated d/c is to: Home              Anticipated d/c date is: > 3 days              Patient currently is not medically stable to d/c.          Consultants:   CT surgery  Infectious disease  Procedures:   Aortic valve replacement, 4/20  Antimicrobials:  Anti-infectives (From admission, onward)   Start     Dose/Rate Route Frequency Ordered Stop   05/03/19 1000  fluconazole (DIFLUCAN) tablet 150 mg     150 mg Oral  Once 05/03/19 0804 05/03/19 1243   04/28/19 0400  vancomycin (VANCOREADY) IVPB 1250 mg/250 mL  Status:  Discontinued     1,250 mg 166.7 mL/hr over 90 Minutes Intravenous To Surgery 04/27/19 1131 04/28/19 1451   04/28/19 0400  cefUROXime (ZINACEF) 1.5 g in sodium chloride 0.9 % 100 mL IVPB     1.5 g 200 mL/hr over 30 Minutes Intravenous To Surgery 04/27/19 1131 04/28/19 0819   04/28/19 0400  cefUROXime (ZINACEF) 750 mg in sodium chloride 0.9 % 100 mL IVPB     750 mg 200 mL/hr over 30 Minutes Intravenous To Surgery 04/27/19 1131 04/28/19 1313   04/15/19 0900  vancomycin (VANCOREADY) IVPB 750 mg/150 mL     750 mg 150 mL/hr over 60 Minutes Intravenous Every 8 hours 04/15/19 0821     04/13/19 1400  vancomycin (VANCOCIN) IVPB 1000 mg/200 mL premix  Status:  Discontinued     1,000 mg 200 mL/hr over 60 Minutes  Intravenous Every 8 hours 04/13/19 0839 04/15/19 0821   04/12/19 2200  vancomycin (VANCOREADY) IVPB 750 mg/150 mL  Status:  Discontinued     750 mg 150 mL/hr over 60 Minutes Intravenous Every 8 hours 04/12/19 1500 04/13/19 0839   04/10/19 1336  vancomycin (VANCOCIN) IVPB 1000 mg/200 mL premix  Status:  Discontinued     1,000 mg 200 mL/hr over 60 Minutes Intravenous Every 8 hours 04/10/19 1320 04/12/19 1500   04/08/19 1200  cefTRIAXone (ROCEPHIN) 2 g in sodium chloride 0.9 % 100 mL IVPB     2 g 200 mL/hr over 30 Minutes Intravenous Every 12 hours 04/08/19 1013 06/09/19 2359   04/08/19 0530  vancomycin (VANCOCIN) IVPB 1000 mg/200 mL premix  Status:  Discontinued     1,000 mg 200 mL/hr over 60 Minutes Intravenous Every 12 hours 04/07/19 1541 04/10/19 1320   04/07/19 1900  Ampicillin-Sulbactam (UNASYN) 3 g in sodium chloride 0.9 % 100 mL IVPB  Status:  Discontinued     3 g 200 mL/hr over 30 Minutes Intravenous Every 6 hours 04/07/19 1457 04/08/19 1013   04/07/19  1730  vancomycin (VANCOREADY) IVPB 1500 mg/300 mL     1,500 mg 150 mL/hr over 120 Minutes Intravenous  Once 04/07/19 1541 04/07/19 1937   04/04/19 1400  ceFAZolin (ANCEF) IVPB 2g/100 mL premix  Status:  Discontinued     2 g 200 mL/hr over 30 Minutes Intravenous Every 8 hours 04/04/19 1119 04/07/19 1457   04/04/19 0400  Vancomycin (VANCOCIN) 1,250 mg in sodium chloride 0.9 % 250 mL IVPB  Status:  Discontinued     1,250 mg 166.7 mL/hr over 90 Minutes Intravenous Every 12 hours 04/03/19 1635 04/04/19 1159   04/02/19 1800  ceFEPIme (MAXIPIME) 2 g in sodium chloride 0.9 % 100 mL IVPB  Status:  Discontinued     2 g 200 mL/hr over 30 Minutes Intravenous Every 8 hours 04/02/19 1218 04/04/19 1127   04/02/19 1600  vancomycin (VANCOCIN) IVPB 1000 mg/200 mL premix  Status:  Discontinued     1,000 mg 200 mL/hr over 60 Minutes Intravenous Every 12 hours 04/02/19 0100 04/03/19 1635   04/02/19 0200  piperacillin-tazobactam (ZOSYN) IVPB 3.375 g   Status:  Discontinued     3.375 g 12.5 mL/hr over 240 Minutes Intravenous Every 8 hours 04/02/19 0059 04/02/19 1219   04/02/19 0200  vancomycin (VANCOCIN) 1,500 mg in sodium chloride 0.9 % 500 mL IVPB     1,500 mg 250 mL/hr over 120 Minutes Intravenous  Once 04/02/19 0100 04/02/19 0923   04/01/19 2040  ceFAZolin (ANCEF) 2-4 GM/100ML-% IVPB    Note to Pharmacy: Channing Mutters   : cabinet override      04/01/19 2040 04/02/19 0844         Subjective: No overnight events.  Fever coming down.  Denies any complaints.  Happy to be walking around.  Happy to eat food from home that her mom brought. Objective: Vitals:   05/07/19 2349 05/08/19 0249 05/08/19 0633 05/08/19 0835  BP: 100/65 (!) 91/55  96/66  Pulse: (!) 105 (!) 105  (!) 107  Resp: 16 16  16   Temp: 98.4 F (36.9 C) 98.4 F (36.9 C)  98.9 F (37.2 C)  TempSrc: Oral Oral  Oral  SpO2: 99% 98%  98%  Weight:   74.2 kg   Height:        Intake/Output Summary (Last 24 hours) at 05/08/2019 1129 Last data filed at 05/08/2019 05/10/2019 Gross per 24 hour  Intake 120 ml  Output 0 ml  Net 120 ml   Filed Weights   05/06/19 0437 05/07/19 0610 05/08/19 0633  Weight: 77.3 kg 75.8 kg 74.2 kg    Examination:  General exam: Appears calm and comfortable, on room air Respiratory system: Clear to auscultation. Respiratory effort normal.  Midline surgical scar looks clean and nontender. Cardiovascular system: S1 & S2 heard, RRR.  Central nervous system: Alert and oriented. No focal neurological deficits. Extremities: Symmetric 5 x 5 power. Skin: No rashes, lesions or ulcers PICC line looks clean and nontender. No joint tenderness.    Data Reviewed: I have personally reviewed following labs and imaging studies  CBC: Recent Labs  Lab 05/04/19 0400 05/05/19 0320 05/06/19 0438 05/07/19 0500 05/08/19 0452  WBC 6.0 5.2 6.0 6.6 6.8  HGB 7.7* 7.6* 7.8* 8.0* 8.3*  HCT 24.7* 25.1* 24.7* 25.8* 26.8*  MCV 87.0 87.8 86.1 87.8 87.3  PLT 345  376 437* 487* 568*   Basic Metabolic Panel: Recent Labs  Lab 05/02/19 0340 05/02/19 0340 05/03/19 0502 05/05/19 0320 05/06/19 0438 05/07/19 0500 05/08/19 0452  NA  137   < > 137 135 138 138 137  K 3.2*   < > 3.8 3.9 3.9 4.0 4.0  CL 98   < > 102 104 106 103 103  CO2 32   < > 28 24 21* 25 26  GLUCOSE 92   < > 95 100* 95 120* 92  BUN 16   < > 10 8 9 9 7   CREATININE 0.65   < > 0.52 0.69 0.63 0.71 0.64  CALCIUM 8.3*   < > 8.5* 8.5* 8.6* 8.5* 8.7*  MG 1.6*  --  1.9  --   --   --   --    < > = values in this interval not displayed.   GFR: Estimated Creatinine Clearance: 106 mL/min (by C-G formula based on SCr of 0.64 mg/dL). Liver Function Tests: Recent Labs  Lab 05/02/19 0340  AST 21  ALT 20  ALKPHOS 74  BILITOT 0.7  PROT 5.0*  ALBUMIN 2.2*   No results for input(s): LIPASE, AMYLASE in the last 168 hours. No results for input(s): AMMONIA in the last 168 hours. Coagulation Profile: No results for input(s): INR, PROTIME in the last 168 hours. Cardiac Enzymes: No results for input(s): CKTOTAL, CKMB, CKMBINDEX, TROPONINI in the last 168 hours. BNP (last 3 results) No results for input(s): PROBNP in the last 8760 hours. HbA1C: No results for input(s): HGBA1C in the last 72 hours. CBG: Recent Labs  Lab 05/01/19 1534 05/01/19 2149 05/02/19 0618 05/02/19 2130  GLUCAP 111* 105* 92 102*   Lipid Profile: No results for input(s): CHOL, HDL, LDLCALC, TRIG, CHOLHDL, LDLDIRECT in the last 72 hours. Thyroid Function Tests: No results for input(s): TSH, T4TOTAL, FREET4, T3FREE, THYROIDAB in the last 72 hours. Anemia Panel: No results for input(s): VITAMINB12, FOLATE, FERRITIN, TIBC, IRON, RETICCTPCT in the last 72 hours. Sepsis Labs: No results for input(s): PROCALCITON, LATICACIDVEN in the last 168 hours.  Recent Results (from the past 240 hour(s))  Aerobic/Anaerobic Culture (surgical/deep wound)     Status: None   Collection Time: 04/28/19 11:54 AM   Specimen: Heart Valve  Leaflet(s); Tissue  Result Value Ref Range Status   Specimen Description VALVE TRICUSPID  Final   Special Requests SPECIMEN C  Final   Gram Stain NO WBC SEEN NO ORGANISMS SEEN   Final   Culture   Final    No growth aerobically or anaerobically. Performed at [facility], [address] [place], [place] [address]    Report Status 05/03/2019 FINAL  Final  Culture, blood (routine x 2)     Status: None (Preliminary result)   Collection Time: 05/06/19 12:14 PM   Specimen: BLOOD LEFT HAND  Result Value Ref Range Status   Specimen Description BLOOD LEFT HAND  Final   Special Requests   Final    BOTTLES DRAWN AEROBIC ONLY Blood Culture results may not be optimal due to an inadequate volume of blood received in culture bottles   Culture   Final    NO GROWTH < 24 HOURS Performed at [facility], [address] [place], [place] [address]    Report Status PENDING  Incomplete  Culture, blood (routine x 2)     Status: None (Preliminary result)   Collection Time: 05/06/19 12:32 PM   Specimen: BLOOD LEFT HAND  Result Value Ref Range Status   Specimen Description BLOOD LEFT HAND  Final   Special Requests   Final    BOTTLES DRAWN AEROBIC ONLY Blood  Culture results may not be optimal due to an inadequate volume of blood received in culture bottles   Culture   Final    NO GROWTH < 24 HOURS Performed at [facility] [address], [place], [place] [address]    Report Status PENDING  Incomplete         Radiology Studies: DG Chest 2 View  Result Date: 05/08/2019 CLINICAL DATA:  Postop EXAM: CHEST - 2 VIEW COMPARISON:  05/06/2019 FINDINGS: Changes of median sternotomy and valve replacement. Mild cardiomegaly. Improving aeration in the lungs with decreasing effusions and airspace opacities. Probable mild residual perihilar edema and bibasilar atelectasis or edema. No significant effusions. No acute bony abnormality. No pneumothorax. Right PICC line tip likely in the right  innominate vein near the confluence of the innominate veins. IMPRESSION: Significant improvement in aeration with decreasing bilateral airspace disease and effusions. Slight residual perihilar edema and bibasilar atelectasis or edema. Electronically Signed   By: Rolm Baptise M.D.   On: 05/08/2019 09:05        Scheduled Meds: . aspirin EC  81 mg Oral Daily   Or  . aspirin  81 mg Per Tube Daily  . bisacodyl  10 mg Oral Daily   Or  . bisacodyl  10 mg Rectal Daily  . busPIRone  7.5 mg Oral BID  . Chlorhexidine Gluconate Cloth  6 each Topical Daily  . clopidogrel  75 mg Oral Daily  . clotrimazole  1 Applicatorful Vaginal QHS  . [place] [facility], Patient & Family Education   Does not apply Once  . docusate sodium  200 mg Oral Daily  . enoxaparin (LOVENOX) injection  30 mg Subcutaneous QHS  . escitalopram  10 mg Oral Daily  . feeding supplement (ENSURE ENLIVE)  237 mL Oral BID BM  . ferrous [id number] C-folic acid  1 capsule Oral BID PC  . furosemide  40 mg Oral Daily  . metoprolol tartrate  12.5 mg Oral BID   Or  . metoprolol tartrate  12.5 mg Per Tube BID  . pantoprazole  40 mg Oral Daily  . pneumococcal 23 valent vaccine  0.5 mL Intramuscular Tomorrow-1000  . potassium chloride  20 mEq Oral TID  . QUEtiapine  25 mg Oral QHS  . sodium chloride flush  3 mL Intravenous Q12H   Continuous Infusions: . sodium chloride    . sodium chloride    . cefTRIAXone (ROCEPHIN)  IV 2 g (05/08/19 0949)  . lactated ringers Stopped (04/30/19 2141)  . vancomycin 750 mg (05/08/19 0551)     LOS: 37 days    Time spent: 25 minutes    Barb Merino, MD [facility] Pager [phone]

## 2019-05-08 NOTE — Progress Notes (Addendum)
[address]       [place] [address]             [phone]      10 Days Post-Op Procedure(s) (LRB): AORTIC VALVE REPLACEMENT (AVR) using Edwards PERIMOUNT Magna Ease Pericardial Bioprosthesis - 29 MM Aortic Valve. (N/A) REPAIR OF TRICUSPID (N/A) TRANSESOPHAGEAL ECHOCARDIOGRAM (TEE) (N/A) Subjective: Feels okay this morning. She still isn't sleeping and she shares that nursing changed her PICC line dressing at 4am.   Objective: Vital signs in last 24 hours: Temp:  [97.7 F (36.5 C)-100.6 F (38.1 C)] 98.4 F (36.9 C) (04/30 0249) Pulse Rate:  [94-116] 105 (04/30 0249) Cardiac Rhythm: Sinus tachycardia (04/30 0249) Resp:  [16-18] 16 (04/30 0249) BP: (91-116)/(55-93) 91/55 (04/30 0249) SpO2:  [98 %-100 %] 98 % (04/30 0249) Weight:  [74.2 kg] 74.2 kg (04/30 0633)     Intake/Output from previous day: No intake/output data recorded. Intake/Output this shift: No intake/output data recorded.  General appearance: alert, cooperative and no distress Heart: sinus tachycardia Lungs: clear to auscultation bilaterally Abdomen: soft, non-tender; bowel sounds normal; no masses,  no organomegaly Extremities: extremities normal, atraumatic, no cyanosis or edema Wound: clean and dry  Lab Results: Recent Labs    05/07/19 0500 05/08/19 0452  WBC 6.6 6.8  HGB 8.0* 8.3*  HCT 25.8* 26.8*  PLT 487* 568*   BMET:  Recent Labs    05/07/19 0500 05/08/19 0452  NA 138 137  K 4.0 4.0  CL 103 103  CO2 25 26  GLUCOSE 120* 92  BUN 9 7  CREATININE 0.71 0.64  CALCIUM 8.5* 8.7*    PT/INR: No results for input(s): LABPROT, INR in the last 72 hours. ABG    Component Value Date/Time   PHART 7.365 04/28/2019 2205   HCO3 24.5 04/28/2019 2205   TCO2 26 04/28/2019 2205   ACIDBASEDEF 1.0 04/28/2019 2205   O2SAT 97.0 04/28/2019 2205   CBG (last 3)  No results for input(s): GLUCAP in the last 72 hours.  Assessment/Plan: S/P Procedure(s) (LRB): AORTIC VALVE  REPLACEMENT (AVR) using Edwards PERIMOUNT Magna Ease Pericardial Bioprosthesis - 29 MM Aortic Valve. (N/A) REPAIR OF TRICUSPID (N/A) TRANSESOPHAGEAL ECHOCARDIOGRAM (TEE) (N/A)  1. CV-sinus tachycardia, hemodynamically stable 2. Pulm-tolerating room air,CXR today showed an improvement in her pleural effusions.  3. Volume Overload -increase oral lasix 40mg  daily. Added metolazone.Continue daily weights. Creatinine 0.71, electrolytes okay. 4. Expected post op acute blood loss anemia - H and H this8.0/25.8Continue Trinsicon. 5. ID-on Vancomycin and Ceftriaxone, blood cultures sent on  4/28 and are NTD. Low grade fever Yesterday, treated with Tylenol. Will watch closely. May be due to some atelectasis from surgery. 6. Neuro-CVA. Will need DAPT for 3-6 months. Patient to follow-up in the stroke clinic 7. GI-tolerating current diet recommendations 8. Hx of substance abuse-assistance needed for rehab placement. CM and social work consulted. 9. Yeast infection-improving.  Plan: Try to cluster care. Ambulate TID. Encouraged incentive spirometer for some lingering atelectasis. Continue current pain medication regimen. Overall doing very well. Appetite is coming back. Continue IV antibiotics until 5/31.      LOS: 37 days    Elgie Collard 05/08/2019   Chart reviewed, patient examined, agree with above. She feels well. Had low grade fever 100.6 yesterday am but resolved. Likely atelectasis. She diuresed well over the past few days and weight has come down, less edema. CXR this am looks good with resolution of right pleural effusion and better aeration of  lung base. Continue antibiotics.

## 2019-05-09 LAB — BASIC METABOLIC PANEL
Anion gap: 9 (ref 5–15)
BUN: 9 mg/dL (ref 6–20)
CO2: 27 mmol/L (ref 22–32)
Calcium: 8.8 mg/dL — ABNORMAL LOW (ref 8.9–10.3)
Chloride: 103 mmol/L (ref 98–111)
Creatinine, Ser: 0.68 mg/dL (ref 0.44–1.00)
GFR calc Af Amer: >60 mL/min (ref >60)
GFR calc non Af Amer: >60 mL/min (ref >60)
Glucose, Bld: 94 mg/dL (ref 70–99)
Potassium: 4.1 mmol/L (ref 3.5–5.1)
Sodium: 139 mmol/L (ref 135–145)

## 2019-05-09 MED ORDER — OXYCODONE HCL 5 MG PO TABS
5.0000 mg | ORAL_TABLET | Freq: Two times a day (BID) | ORAL | Status: DC | PRN
  Administered 2019-05-10 – 2019-05-22 (×10): 5 mg via ORAL
  Filled 2019-05-09 (×10): qty 1

## 2019-05-09 NOTE — Progress Notes (Signed)
PROGRESS NOTE    Lauren Reyes  VOZ:[id number] DOB: 16-Feb-1990 DOA: 04/01/2019 PCP: Patient, No Pcp Per    Brief Narrative:  29 year old female with past medical history with substance abuse with heroin, crystal meth and cocaine as well as smoking presents to the ER with aphasia and right-sided weakness and code stroke.  Admitted to neurology ICU.  Patient was intubated underwent thrombectomy for left MCA M3 occlusion and recanalization.  This was complicated by dissection and pseudoaneurysm of the left ICA SP stent placement. Extubated on April 04, 2019.  Also had a DVT but treated with IVC filter.  TEE showed infective endocarditis. S/P bioprosthetic aortic valve replacement and tricuspid valve repair on 4/20 Currently further plan is continue IV antibiotics for endocarditis until 5/31  4/28-4/29: Patient has low-grade temperature 100 degree and tachycardia.  Blood cultures drawn on 4/28.  No growth so far.  Temperature trending down.  Assessment & Plan:   Active Problems:   Acute ischemic left MCA stroke (HCC)   Middle cerebral artery embolism, left   DVT (deep venous thrombosis) (HCC)   Encounter for central line placement   Endotracheal tube present   Cerebrovascular accident (CVA) due to embolism of precerebral artery (HCC)   Infective endocarditis   Normocytic anemia   Polysubstance abuse (HCC)   IVDU (intravenous drug user)   Tachypnea   Endocarditis  1.  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 Severe AR. SP aortic valve replacement with bioprosthetic valve and tricuspid repair. MSSA in sputum.  Suspected CMV infection Initially presented with code stroke, intubated and admitted to the neuro ICU. Tracheal aspirate grew MSSA. So far tissue culture with no growth to date. Remains on IV vancomycin and IV ceftriaxone. 6 weeks of antibiotics post surgery through Jun 08, 2019. On the path report staining looks like CMV in some areas.  Followed by  ID. PICC line inserted. Patient will remain in a supervised setting for completion of the antibiotics. Repeat cultures 4/28.  Low-grade temperature, will monitor.  No growth so far.  Improving.  2.  Aortic valve endocarditis. SP aortic valve replacement 04/28/2019 and tricuspid repair Cardiothoracic surgery following. Clinically improving.  On room air.  Beta-blockers and Lasix with potassium supplements adjusted. Electrolytes remained stable.  3.  Acute CVA of the MCA territory.  Embolic in nature. TPA was deferred as concern for septic emboli. SP left common carotid arteriogram with complete revascularization of the occluded M3. Complicated by dissection and pseudoaneurysm of the left ICA S/P placement of 4.5 mm x 25 mm pipeline flow diverter stent. Initially was on aspirin and Brilinta.  Brilinta was held for cardiothoracic surgery. Patient has been off Brilinta but on aspirin for cardiothoracic surgery. Patient has intracranial stenting therefore will require dual antiplatelet therapy with aspirin and Plavix per neurology. Continue this for 3 to 6 months. Patient will follow up with Dr. Corliss Skains and decide on exact regimen. Patient will also require follow-up with neurology.  4.  Right common femoral DVT, Right groin hematoma, IVC filter placement April 03, 2019. During the hospitalization patient was found to have right common femoral DVT. Patient also was found to have large right groin hematoma post vascular intervention Decision was made to place IVC filter placement since the patient was felt not a candidate for therapeutic anticoagulation given her septic emboli, dissection as well as hematoma.  5.  Acute blood loss anemia. Iron deficiency anemia. B12 deficiency. Folate deficiency. Continue supplementation. H&H relatively stable. No active bleeding identified. No GI bleed.  Hemoglobin is stable.  6.  Polysubstance abuse. UDS positive for THC, amphetamine  admission. Suspected IV drug use as well with needle marks. Currently on oral pain medication. Out of the window for any withdrawals.  7.  Anxiety. Currently on BuSpar, Lexapro, Seroquel.  May not require Seroquel at discharge. As needed Ativan.  8.  Hepatitis C antibody positive. Negative viral load. HIV screen negative. RPR negative. Will require outpatient follow-up and therapy.  9.  Pain control. On as needed oxycodone.  Suggested patient to use Tylenol and decrease dose of oxycodone. She is using about 1 oxycodone a day.  She will gradually come off of it. Not used benzodiazepine for last 10 days, will discontinue.   DVT prophylaxis: Heparin subcu Code Status: Full code Family Communication: None Disposition Plan: Status is: Inpatient  Remains inpatient appropriate because:IV treatments appropriate due to intensity of illness or inability to take PO   Dispo: The patient is from: Home              Anticipated d/c is to: Home              Anticipated d/c date is: > 3 days              Patient currently is not medically stable to d/c.          Consultants:   CT surgery  Infectious disease  Procedures:   Aortic valve replacement, 4/20  Antimicrobials:  Anti-infectives (From admission, onward)   Start     Dose/Rate Route Frequency Ordered Stop   05/03/19 1000  fluconazole (DIFLUCAN) tablet 150 mg     150 mg Oral  Once 05/03/19 0804 05/03/19 1243   04/28/19 0400  vancomycin (VANCOREADY) IVPB 1250 mg/250 mL  Status:  Discontinued     1,250 mg 166.7 mL/hr over 90 Minutes Intravenous To Surgery 04/27/19 1131 04/28/19 1451   04/28/19 0400  cefUROXime (ZINACEF) 1.5 g in sodium chloride 0.9 % 100 mL IVPB     1.5 g 200 mL/hr over 30 Minutes Intravenous To Surgery 04/27/19 1131 04/28/19 0819   04/28/19 0400  cefUROXime (ZINACEF) 750 mg in sodium chloride 0.9 % 100 mL IVPB     750 mg 200 mL/hr over 30 Minutes Intravenous To Surgery 04/27/19 1131 04/28/19 1313    04/15/19 0900  vancomycin (VANCOREADY) IVPB 750 mg/150 mL     750 mg 150 mL/hr over 60 Minutes Intravenous Every 8 hours 04/15/19 0821     04/13/19 1400  vancomycin (VANCOCIN) IVPB 1000 mg/200 mL premix  Status:  Discontinued     1,000 mg 200 mL/hr over 60 Minutes Intravenous Every 8 hours 04/13/19 0839 04/15/19 0821   04/12/19 2200  vancomycin (VANCOREADY) IVPB 750 mg/150 mL  Status:  Discontinued     750 mg 150 mL/hr over 60 Minutes Intravenous Every 8 hours 04/12/19 1500 04/13/19 0839   04/10/19 1336  vancomycin (VANCOCIN) IVPB 1000 mg/200 mL premix  Status:  Discontinued     1,000 mg 200 mL/hr over 60 Minutes Intravenous Every 8 hours 04/10/19 1320 04/12/19 1500   04/08/19 1200  cefTRIAXone (ROCEPHIN) 2 g in sodium chloride 0.9 % 100 mL IVPB     2 g 200 mL/hr over 30 Minutes Intravenous Every 12 hours 04/08/19 1013 06/09/19 2359   04/08/19 0530  vancomycin (VANCOCIN) IVPB 1000 mg/200 mL premix  Status:  Discontinued     1,000 mg 200 mL/hr over 60 Minutes Intravenous Every 12 hours 04/07/19 1541 04/10/19 1320  04/07/19 1900  Ampicillin-Sulbactam (UNASYN) 3 g in sodium chloride 0.9 % 100 mL IVPB  Status:  Discontinued     3 g 200 mL/hr over 30 Minutes Intravenous Every 6 hours 04/07/19 1457 04/08/19 1013   04/07/19 1730  vancomycin (VANCOREADY) IVPB 1500 mg/300 mL     1,500 mg 150 mL/hr over 120 Minutes Intravenous  Once 04/07/19 1541 04/07/19 1937   04/04/19 1400  ceFAZolin (ANCEF) IVPB 2g/100 mL premix  Status:  Discontinued     2 g 200 mL/hr over 30 Minutes Intravenous Every 8 hours 04/04/19 1119 04/07/19 1457   04/04/19 0400  Vancomycin (VANCOCIN) 1,250 mg in sodium chloride 0.9 % 250 mL IVPB  Status:  Discontinued     1,250 mg 166.7 mL/hr over 90 Minutes Intravenous Every 12 hours 04/03/19 1635 04/04/19 1159   04/02/19 1800  ceFEPIme (MAXIPIME) 2 g in sodium chloride 0.9 % 100 mL IVPB  Status:  Discontinued     2 g 200 mL/hr over 30 Minutes Intravenous Every 8 hours  04/02/19 1218 04/04/19 1127   04/02/19 1600  vancomycin (VANCOCIN) IVPB 1000 mg/200 mL premix  Status:  Discontinued     1,000 mg 200 mL/hr over 60 Minutes Intravenous Every 12 hours 04/02/19 0100 04/03/19 1635   04/02/19 0200  piperacillin-tazobactam (ZOSYN) IVPB 3.375 g  Status:  Discontinued     3.375 g 12.5 mL/hr over 240 Minutes Intravenous Every 8 hours 04/02/19 0059 04/02/19 1219   04/02/19 0200  vancomycin (VANCOCIN) 1,500 mg in sodium chloride 0.9 % 500 mL IVPB     1,500 mg 250 mL/hr over 120 Minutes Intravenous  Once 04/02/19 0100 04/02/19 0923   04/01/19 2040  ceFAZolin (ANCEF) 2-4 GM/100ML-% IVPB    Note to Pharmacy: Channing Mutters   : cabinet override      04/01/19 2040 04/02/19 0844         Subjective: Seen and examined.  No overnight events. Objective: Vitals:   05/08/19 2215 05/09/19 0349 05/09/19 0505 05/09/19 0801  BP: 100/72 97/69  103/74  Pulse: (!) 108 (!) 102  (!) 105  Resp:  18  18  Temp: 97.8 F (36.6 C) 98 F (36.7 C)  98.3 F (36.8 C)  TempSrc: Oral Oral  Oral  SpO2: 96% 100%  100%  Weight:   72.8 kg   Height:        Intake/Output Summary (Last 24 hours) at 05/09/2019 1117 Last data filed at 05/08/2019 2100 Gross per 24 hour  Intake 360 ml  Output 1 ml  Net 359 ml   Filed Weights   05/07/19 0610 05/08/19 0633 05/09/19 0505  Weight: 75.8 kg 74.2 kg 72.8 kg    Examination:  General exam: Appears calm and comfortable, on room air.  Sitting in couch.  Earlier she was walking around. Respiratory system: Clear to auscultation. Respiratory effort normal.  Midline surgical scar looks clean and nontender. Cardiovascular system: S1 & S2 heard, RRR.  Central nervous system: Alert and oriented. No focal neurological deficits. Extremities: Symmetric 5 x 5 power. Skin: No rashes, lesions or ulcers PICC line looks clean and nontender. No joint tenderness.    Data Reviewed: I have personally reviewed following labs and imaging studies  CBC: Recent  Labs  Lab 05/04/19 0400 05/05/19 0320 05/06/19 0438 05/07/19 0500 05/08/19 0452  WBC 6.0 5.2 6.0 6.6 6.8  HGB 7.7* 7.6* 7.8* 8.0* 8.3*  HCT 24.7* 25.1* 24.7* 25.8* 26.8*  MCV 87.0 87.8 86.1 87.8 87.3  PLT 345  376 437* 487* 568*   Basic Metabolic Panel: Recent Labs  Lab 05/03/19 0502 05/03/19 0502 05/05/19 0320 05/06/19 0438 05/07/19 0500 05/08/19 0452 05/09/19 0500  NA 137   < > 135 138 138 137 139  K 3.8   < > 3.9 3.9 4.0 4.0 4.1  CL 102   < > 104 106 103 103 103  CO2 28   < > 24 21* 25 26 27   GLUCOSE 95   < > 100* 95 120* 92 94  BUN 10   < > 8 9 9 7 9   CREATININE 0.52   < > 0.69 0.63 0.71 0.64 0.68  CALCIUM 8.5*   < > 8.5* 8.6* 8.5* 8.7* 8.8*  MG 1.9  --   --   --   --   --   --    < > = values in this interval not displayed.   GFR: Estimated Creatinine Clearance: 105 mL/min (by C-G formula based on SCr of 0.68 mg/dL). Liver Function Tests: No results for input(s): AST, ALT, ALKPHOS, BILITOT, PROT, ALBUMIN in the last 168 hours. No results for input(s): LIPASE, AMYLASE in the last 168 hours. No results for input(s): AMMONIA in the last 168 hours. Coagulation Profile: No results for input(s): INR, PROTIME in the last 168 hours. Cardiac Enzymes: No results for input(s): CKTOTAL, CKMB, CKMBINDEX, TROPONINI in the last 168 hours. BNP (last 3 results) No results for input(s): PROBNP in the last 8760 hours. HbA1C: No results for input(s): HGBA1C in the last 72 hours. CBG: Recent Labs  Lab 05/02/19 2130  GLUCAP 102*   Lipid Profile: No results for input(s): CHOL, HDL, LDLCALC, TRIG, CHOLHDL, LDLDIRECT in the last 72 hours. Thyroid Function Tests: No results for input(s): TSH, T4TOTAL, FREET4, T3FREE, THYROIDAB in the last 72 hours. Anemia Panel: No results for input(s): VITAMINB12, FOLATE, FERRITIN, TIBC, IRON, RETICCTPCT in the last 72 hours. Sepsis Labs: No results for input(s): PROCALCITON, LATICACIDVEN in the last 168 hours.  Recent Results (from the past  240 hour(s))  Culture, blood (routine x 2)     Status: None (Preliminary result)   Collection Time: 05/06/19 12:14 PM   Specimen: BLOOD LEFT HAND  Result Value Ref Range Status   Specimen Description BLOOD LEFT HAND  Final   Special Requests   Final    BOTTLES DRAWN AEROBIC ONLY Blood Culture results may not be optimal due to an inadequate volume of blood received in culture bottles   Culture   Final    NO GROWTH 3 DAYS Performed at [facility], [address], [place], [address] [place]    Report Status PENDING  Incomplete  Culture, blood (routine x 2)     Status: None (Preliminary result)   Collection Time: 05/06/19 12:32 PM   Specimen: BLOOD LEFT HAND  Result Value Ref Range Status   Specimen Description BLOOD LEFT HAND  Final   Special Requests   Final    BOTTLES DRAWN AEROBIC ONLY Blood Culture results may not be optimal due to an inadequate volume of blood received in culture bottles   Culture   Final    NO GROWTH 3 DAYS Performed at [facility], [address], [place], [address] [place]    Report Status PENDING  Incomplete         Radiology Studies: DG Chest 2 View  Result Date: 05/08/2019 CLINICAL DATA:  Postop EXAM: CHEST - 2 VIEW COMPARISON:  05/06/2019 FINDINGS: Changes of median sternotomy  and valve replacement. Mild cardiomegaly. Improving aeration in the lungs with decreasing effusions and airspace opacities. Probable mild residual perihilar edema and bibasilar atelectasis or edema. No significant effusions. No acute bony abnormality. No pneumothorax. Right PICC line tip likely in the right innominate vein near the confluence of the innominate veins. IMPRESSION: Significant improvement in aeration with decreasing bilateral airspace disease and effusions. Slight residual perihilar edema and bibasilar atelectasis or edema. Electronically Signed   By: Charlett NoseKevin  Dover M.D.   On: 05/08/2019 09:05        Scheduled Meds: . aspirin EC  81 mg Oral Daily    Or  . aspirin  81 mg Per Tube Daily  . bisacodyl  10 mg Oral Daily   Or  . bisacodyl  10 mg Rectal Daily  . busPIRone  7.5 mg Oral BID  . Chlorhexidine Gluconate Cloth  6 each Topical Daily  . clopidogrel  75 mg Oral Daily  . clotrimazole  1 Applicatorful Vaginal QHS  . [facility] Education   Does not apply Once  . docusate sodium  200 mg Oral Daily  . enoxaparin (LOVENOX) injection  30 mg Subcutaneous QHS  . escitalopram  10 mg Oral Daily  . feeding supplement (ENSURE ENLIVE)  237 mL Oral BID BM  . ferrous fumarate-b12-vitamic C-folic acid  1 capsule Oral BID PC  . furosemide  40 mg Oral Daily  . metoprolol tartrate  12.5 mg Oral BID   Or  . metoprolol tartrate  12.5 mg Per Tube BID  . pantoprazole  40 mg Oral Daily  . pneumococcal 23 valent vaccine  0.5 mL Intramuscular Tomorrow-1000  . potassium chloride  20 mEq Oral TID  . QUEtiapine  25 mg Oral QHS  . sodium chloride flush  3 mL Intravenous Q12H   Continuous Infusions: . sodium chloride    . sodium chloride    . cefTRIAXone (ROCEPHIN)  IV 2 g (05/09/19 0924)  . lactated ringers Stopped (04/30/19 2141)  . vancomycin 750 mg (05/09/19 0508)     LOS: 38 days    Time spent: 25 minutes    Dorcas CarrowKuber Demitrious Mccannon, MD [facility] Pager [phone]

## 2019-05-09 NOTE — Progress Notes (Signed)
CARDIAC REHAB PHASE I   PRE:  Rate/Rhythm: 109 ST    BP: sitting 102/63    SaO2: 99 RA  MODE:  Ambulation: 790 ft   POST:  Rate/Rhythm: 130 ST    BP: sitting 128/83     SaO2: 99 RA  Pt moving well, tolerated walk well. No major c/o. Rest x1 for SOB. Thankful for walk. Encouraged more this weekend and also IS. [phone]   Harriet Masson CES, ACSM 05/09/2019 10:37 AM

## 2019-05-09 NOTE — Progress Notes (Addendum)
      [address]       [place] [address]             [phone]      11 Days Post-Op Procedure(s) (LRB): AORTIC VALVE REPLACEMENT (AVR) using Edwards PERIMOUNT Magna Ease Pericardial Bioprosthesis - 29 MM Aortic Valve. (N/A) REPAIR OF TRICUSPID (N/A) TRANSESOPHAGEAL ECHOCARDIOGRAM (TEE) (N/A) Subjective: Feels okay this morning. No issues.   Objective: Vital signs in last 24 hours: Temp:  [97.8 F (36.6 C)-100.5 F (38.1 C)] 98.3 F (36.8 C) (05/01 0801) Pulse Rate:  [98-108] 105 (05/01 0801) Cardiac Rhythm: Sinus tachycardia (05/01 0349) Resp:  [18] 18 (05/01 0801) BP: (91-109)/(66-77) 103/74 (05/01 0801) SpO2:  [96 %-100 %] 100 % (05/01 0801) Weight:  [72.8 kg] 72.8 kg (05/01 0505)     Intake/Output from previous day: 04/30 0701 - 05/01 0700 In: 480 [P.O.:480] Out: 1 [Stool:1] Intake/Output this shift: No intake/output data recorded.  General appearance: alert, cooperative and no distress Heart: regular rate and rhythm, S1, S2 normal, no murmur, click, rub or gallop Lungs: clear to auscultation bilaterally Abdomen: soft, non-tender; bowel sounds normal; no masses,  no organomegaly Extremities: extremities normal, atraumatic, no cyanosis or edema Wound: clean and dry  Lab Results: Recent Labs    05/07/19 0500 05/08/19 0452  WBC 6.6 6.8  HGB 8.0* 8.3*  HCT 25.8* 26.8*  PLT 487* 568*   BMET:  Recent Labs    05/08/19 0452 05/09/19 0500  NA 137 139  K 4.0 4.1  CL 103 103  CO2 26 27  GLUCOSE 92 94  BUN 7 9  CREATININE 0.64 0.68  CALCIUM 8.7* 8.8*    PT/INR: No results for input(s): LABPROT, INR in the last 72 hours. ABG    Component Value Date/Time   PHART 7.365 04/28/2019 2205   HCO3 24.5 04/28/2019 2205   TCO2 26 04/28/2019 2205   ACIDBASEDEF 1.0 04/28/2019 2205   O2SAT 97.0 04/28/2019 2205   CBG (last 3)  No results for input(s): GLUCAP in the last 72 hours.  Assessment/Plan: S/P Procedure(s) (LRB): AORTIC VALVE  REPLACEMENT (AVR) using Edwards PERIMOUNT Magna Ease Pericardial Bioprosthesis - 29 MM Aortic Valve. (N/A) REPAIR OF TRICUSPID (N/A) TRANSESOPHAGEAL ECHOCARDIOGRAM (TEE) (N/A)  1. CV-sinus tachycardia, hemodynamically stable 2. Pulm-tolerating room air,CXR today showed an improvement in her pleural effusions.  3. Volume Overload - oral lasix 40mg  daily. Metolazone discontinued. Continue daily weights.Creatinine 0.68, electrolytes okay. 4. Expected post op acute blood loss anemia - H and H this8.3/26.8Continue Trinsicon. 5. ID-on Vancomycin and Ceftriaxone, blood cultures sent on  4/28 and are NTD. Low grade fever Yesterday, treated with Tylenol. Will watch closely. May be due to some atelectasis from surgery.  6. Neuro-CVA. Will need DAPT for 3-6 months. Patient to follow-up in the stroke clinic 7. GI-tolerating current diet recommendations 8. Hx of substance abuse-assistance needed for rehab placement. CM and social work consulted. 9. Yeast infection-improving.  Plan: Continue medical therapy. Doing well overall. Continue to ambulate TID. Sleeping better. No changes in medications.    LOS: 38 days    5/28 05/09/2019   Agree with above Continue abx  Brelan Hannen O Chloie Loney

## 2019-05-09 NOTE — Progress Notes (Signed)
Pharmacy Antibiotic Note Lauren Reyes is a 29 y.o. female with endocarditis involving aortic, mitral, and tricuspid valves with severe aortic regurgitation. Patient's condition is further complicated by septic emboli to her brain, lungs, and spleen. Respiratory culture grew MSSA, however, recent blood cultures are negative. She is s/p AVR/MVR/TVR on 4/20.  Renal function very stable. Will continue weekly montiroing. Vancomycin doses are being adjusted on troughs alone due to concern of cerebritis, goal 15-20.   Plan: - Continue Vancomycin at 750 mg IV q 8 hours  - F/u weekly vancomycin level, next due 5/5  - Continue ceftriaxone IV 2g q12 per ID - Monitor renal function, clinical status - End date for antibiotics: 06/09/19  Height: 5\' 6"  (167.6 cm) Weight: 72.8 kg (160 lb 7.9 oz) IBW/kg (Calculated) : 59.3  Temp (24hrs), Avg:98.4 F (36.9 C), Min:97.8 F (36.6 C), Max:100.5 F (38.1 C)  Recent Labs  Lab 05/03/19 0502 05/04/19 0400 05/05/19 0320 05/06/19 0438 05/06/19 1300 05/07/19 0500 05/08/19 0452 05/09/19 0500  WBC  --  6.0 5.2 6.0  --  6.6 6.8  --   CREATININE   < >  --  0.69 0.63  --  0.71 0.64 0.68  VANCOTROUGH  --   --   --   --  16  --   --   --    < > = values in this interval not displayed.    Estimated Creatinine Clearance: 105 mL/min (by C-G formula based on SCr of 0.68 mg/dL).    No Known Allergies  Antimicrobials this admission: Zosyn 3/25 >> 3/25 Cefepime 3/25 >> 3/27 Ancef 3/27 >> 3/30 Vanc 3/25 >> 3/27; restarted 3/30 >> (6/1) Unasyn 3/30>>3/31 Ceftriaxone 3/31>> (6/1) Flucon 4/25 Clotrimazole cream 4/25> 5/1   4/28 VT 16  4/21 VT 16 4/16 VT 17  4/10 VT (drawn 6hr after dose) 16  4/7 VT 23  Microbiology results: 3/24 COVID/Flu >> neg 3/25 RCx >> moderate MSSA 3/25 MRSA PCR >> negative 3/25 BCx >> negative  4/16 MRSA PCR >> neg 4/20 MRSA PCR >> neg 4/20 Tricuspid valve specimen: neg 4/20 pericardial fluid: neg 4/20 aortic valve  leaflets: neg  Thank you for allowing pharmacy to be a part of this patient's care.  5/20, PharmD PGY1 Acute Care Pharmacy Resident  Please check AMION for all [facility] Pharmacy phone numbers After 10:00 PM, call Main Pharmacy [phone]

## 2019-05-10 NOTE — Progress Notes (Addendum)
      [address]       [place] [address]             [phone]      12 Days Post-Op Procedure(s) (LRB): AORTIC VALVE REPLACEMENT (AVR) using Edwards PERIMOUNT Magna Ease Pericardial Bioprosthesis - 29 MM Aortic Valve. (N/A) REPAIR OF TRICUSPID (N/A) TRANSESOPHAGEAL ECHOCARDIOGRAM (TEE) (N/A) Subjective: No issues over night. Patient is progressing well.   Objective: Vital signs in last 24 hours: Temp:  [97.7 F (36.5 C)-98.7 F (37.1 C)] 98.4 F (36.9 C) (05/02 0510) Pulse Rate:  [99-116] 99 (05/02 0510) Cardiac Rhythm: Sinus tachycardia (05/02 0700) Resp:  [16-19] 19 (05/01 2055) BP: (95-110)/(70-76) 97/72 (05/02 0510) SpO2:  [98 %-100 %] 100 % (05/02 0510) Weight:  [72.1 kg] 72.1 kg (05/02 0510)     Intake/Output from previous day: 05/01 0701 - 05/02 0700 In: 600 [P.O.:600] Out: -  Intake/Output this shift: No intake/output data recorded.  General appearance: alert, cooperative and no distress Heart: sinus tachycardia Lungs: clear to auscultation bilaterally Abdomen: soft, non-tender; bowel sounds normal; no masses,  no organomegaly Extremities: extremities normal, atraumatic, no cyanosis or edema Wound: clean and dry  Lab Results: Recent Labs    05/08/19 0452  WBC 6.8  HGB 8.3*  HCT 26.8*  PLT 568*   BMET:  Recent Labs    05/08/19 0452 05/09/19 0500  NA 137 139  K 4.0 4.1  CL 103 103  CO2 26 27  GLUCOSE 92 94  BUN 7 9  CREATININE 0.64 0.68  CALCIUM 8.7* 8.8*    PT/INR: No results for input(s): LABPROT, INR in the last 72 hours. ABG    Component Value Date/Time   PHART 7.365 04/28/2019 2205   HCO3 24.5 04/28/2019 2205   TCO2 26 04/28/2019 2205   ACIDBASEDEF 1.0 04/28/2019 2205   O2SAT 97.0 04/28/2019 2205   CBG (last 3)  No results for input(s): GLUCAP in the last 72 hours.  Assessment/Plan: S/P Procedure(s) (LRB): AORTIC VALVE REPLACEMENT (AVR) using Edwards PERIMOUNT Magna Ease Pericardial Bioprosthesis - 29  MM Aortic Valve. (N/A) REPAIR OF TRICUSPID (N/A) TRANSESOPHAGEAL ECHOCARDIOGRAM (TEE) (N/A)  1. CV-sinus tachycardia, hemodynamically stable 2. Pulm-tolerating room air,CXR today showed an improvement in her pleural effusions. 3. Volume Overload - oral lasix 40mg  daily. Continue daily weights.Creatinine 0.68, electrolytes okay. 4. Expected post op acute blood loss anemia - H and H this8.3/26.8Continue Trinsicon. 5. ID-on Vancomycin and Ceftriaxone, blood cultures sent on 4/28 and are NTD. No recent fevers. Will continue until 5/31 6. Neuro-CVA. Will need DAPT for 3-6 months. Patient to follow-up in the stroke clinic 7. GI-tolerating current diet recommendations 8. Hx of substance abuse-assistance needed for rehab placement. CM and social work consulted. 9. Yeast infection-improving  Plan: Patient is doing well. Ambulating several times a day. Pain is well controlled. Only issue is not getting enough sleep at night-benadryl is ordered PRN.    LOS: 39 days    6/31 05/10/2019   Agree with above Continue abx Janeliz Prestwood O Gwenna Fuston

## 2019-05-10 NOTE — Progress Notes (Signed)
PROGRESS NOTE    Lauren Reyes  ZOX:[id number] DOB: November 12, 1990 DOA: 04/01/2019 PCP: Patient, No Pcp Per    Brief Narrative:  29 year old female with past medical history with substance abuse with heroin, crystal meth and cocaine as well as smoking presents to the ER with aphasia and right-sided weakness and code stroke.  Admitted to neurology ICU.  Patient was intubated underwent thrombectomy for left MCA M3 occlusion and recanalization.  This was complicated by dissection and pseudoaneurysm of the left ICA SP stent placement. Extubated on April 04, 2019.  Also had a DVT but treated with IVC filter.  TEE showed infective endocarditis. S/P bioprosthetic aortic valve replacement and tricuspid valve repair on 4/20 Currently further plan is continue IV antibiotics for endocarditis until 5/31  4/28-4/29: Patient has low-grade temperature 100 degree and tachycardia.  Blood cultures drawn on 4/28.  No growth so far.  Temperature improved.  Assessment & Plan:   Active Problems:   Acute ischemic left MCA stroke (HCC)   Middle cerebral artery embolism, left   DVT (deep venous thrombosis) (HCC)   Encounter for central line placement   Endotracheal tube present   Cerebrovascular accident (CVA) due to embolism of precerebral artery (HCC)   Infective endocarditis   Normocytic anemia   Polysubstance abuse (HCC)   IVDU (intravenous drug user)   Tachypnea   Endocarditis  1.  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 Severe AR. SP aortic valve replacement with bioprosthetic valve and tricuspid repair. MSSA in sputum.  Suspected CMV infection Initially presented with code stroke, intubated and admitted to the neuro ICU. Tracheal aspirate grew MSSA. So far tissue culture with no growth to date. Remains on IV vancomycin and IV ceftriaxone. 6 weeks of antibiotics post surgery through Jun 08, 2019. On the path report staining looks like CMV in some areas.  Followed by  ID. PICC line inserted. Patient will remain in a supervised setting for completion of the antibiotics. Repeat cultures 4/28 with no growth.  No fever.  2.  Aortic valve endocarditis. SP aortic valve replacement 04/28/2019 and tricuspid repair Cardiothoracic surgery following. Clinically improving.  On room air.  Beta-blockers and Lasix with potassium supplements adjusted. Electrolytes remained stable.  3.  Acute CVA of the MCA territory.  Embolic in nature. TPA was deferred as concern for septic emboli. SP left common carotid arteriogram with complete revascularization of the occluded M3. Complicated by dissection and pseudoaneurysm of the left ICA S/P placement of 4.5 mm x 25 mm pipeline flow diverter stent. Initially was on aspirin and Brilinta.  Brilinta was held for cardiothoracic surgery. Patient has been off Brilinta but on aspirin for cardiothoracic surgery. Patient has intracranial stenting therefore will require dual antiplatelet therapy with aspirin and Plavix per neurology. Continue this for 3 to 6 months. Patient will follow up with Dr. Corliss Skains and decide on exact regimen. Patient will also require follow-up with neurology.  4.  Right common femoral DVT, Right groin hematoma, IVC filter placement April 03, 2019. During the hospitalization patient was found to have right common femoral DVT. Patient also was found to have large right groin hematoma post vascular intervention Decision was made to place IVC filter placement since the patient was felt not a candidate for therapeutic anticoagulation given her septic emboli, dissection as well as hematoma.  5.  Acute blood loss anemia. Iron deficiency anemia. B12 deficiency. Folate deficiency. Continue supplementation. H&H relatively stable. No active bleeding identified. No GI bleed. Hemoglobin is stable.  6.  Polysubstance  abuse. UDS positive for THC, amphetamine admission. Suspected IV drug use as well with needle  marks. Currently on oral pain medication. Out of the window for any withdrawals.  7.  Anxiety. Currently on BuSpar, Lexapro, Seroquel.  May not require Seroquel at discharge. As needed Ativan.  8.  Hepatitis C antibody positive. Negative viral load. HIV screen negative. RPR negative. Will require outpatient follow-up and therapy.  9.  Pain control. On as needed oxycodone.  Suggested patient to use Tylenol and decrease dose of oxycodone. Tapering off oxycodone.   DVT prophylaxis: Heparin subcu Code Status: Full code Family Communication: None Disposition Plan: Status is: Inpatient  Remains inpatient appropriate because:IV treatments appropriate due to intensity of illness or inability to take PO   Dispo: The patient is from: Home              Anticipated d/c is to: Home              Anticipated d/c date is: > 3 days              Patient currently is not medically stable to d/c.          Consultants:   CT surgery  Infectious disease  Procedures:   Aortic valve replacement, 4/20  Antimicrobials:  Anti-infectives (From admission, onward)   Start     Dose/Rate Route Frequency Ordered Stop   05/03/19 1000  fluconazole (DIFLUCAN) tablet 150 mg     150 mg Oral  Once 05/03/19 0804 05/03/19 1243   04/28/19 0400  vancomycin (VANCOREADY) IVPB 1250 mg/250 mL  Status:  Discontinued     1,250 mg 166.7 mL/hr over 90 Minutes Intravenous To Surgery 04/27/19 1131 04/28/19 1451   04/28/19 0400  cefUROXime (ZINACEF) 1.5 g in sodium chloride 0.9 % 100 mL IVPB     1.5 g 200 mL/hr over 30 Minutes Intravenous To Surgery 04/27/19 1131 04/28/19 0819   04/28/19 0400  cefUROXime (ZINACEF) 750 mg in sodium chloride 0.9 % 100 mL IVPB     750 mg 200 mL/hr over 30 Minutes Intravenous To Surgery 04/27/19 1131 04/28/19 1313   04/15/19 0900  vancomycin (VANCOREADY) IVPB 750 mg/150 mL     750 mg 150 mL/hr over 60 Minutes Intravenous Every 8 hours 04/15/19 0821     04/13/19 1400   vancomycin (VANCOCIN) IVPB 1000 mg/200 mL premix  Status:  Discontinued     1,000 mg 200 mL/hr over 60 Minutes Intravenous Every 8 hours 04/13/19 0839 04/15/19 0821   04/12/19 2200  vancomycin (VANCOREADY) IVPB 750 mg/150 mL  Status:  Discontinued     750 mg 150 mL/hr over 60 Minutes Intravenous Every 8 hours 04/12/19 1500 04/13/19 0839   04/10/19 1336  vancomycin (VANCOCIN) IVPB 1000 mg/200 mL premix  Status:  Discontinued     1,000 mg 200 mL/hr over 60 Minutes Intravenous Every 8 hours 04/10/19 1320 04/12/19 1500   04/08/19 1200  cefTRIAXone (ROCEPHIN) 2 g in sodium chloride 0.9 % 100 mL IVPB     2 g 200 mL/hr over 30 Minutes Intravenous Every 12 hours 04/08/19 1013 06/09/19 2359   04/08/19 0530  vancomycin (VANCOCIN) IVPB 1000 mg/200 mL premix  Status:  Discontinued     1,000 mg 200 mL/hr over 60 Minutes Intravenous Every 12 hours 04/07/19 1541 04/10/19 1320   04/07/19 1900  Ampicillin-Sulbactam (UNASYN) 3 g in sodium chloride 0.9 % 100 mL IVPB  Status:  Discontinued     3 g 200 mL/hr over  30 Minutes Intravenous Every 6 hours 04/07/19 1457 04/08/19 1013   04/07/19 1730  vancomycin (VANCOREADY) IVPB 1500 mg/300 mL     1,500 mg 150 mL/hr over 120 Minutes Intravenous  Once 04/07/19 1541 04/07/19 1937   04/04/19 1400  ceFAZolin (ANCEF) IVPB 2g/100 mL premix  Status:  Discontinued     2 g 200 mL/hr over 30 Minutes Intravenous Every 8 hours 04/04/19 1119 04/07/19 1457   04/04/19 0400  Vancomycin (VANCOCIN) 1,250 mg in sodium chloride 0.9 % 250 mL IVPB  Status:  Discontinued     1,250 mg 166.7 mL/hr over 90 Minutes Intravenous Every 12 hours 04/03/19 1635 04/04/19 1159   04/02/19 1800  ceFEPIme (MAXIPIME) 2 g in sodium chloride 0.9 % 100 mL IVPB  Status:  Discontinued     2 g 200 mL/hr over 30 Minutes Intravenous Every 8 hours 04/02/19 1218 04/04/19 1127   04/02/19 1600  vancomycin (VANCOCIN) IVPB 1000 mg/200 mL premix  Status:  Discontinued     1,000 mg 200 mL/hr over 60 Minutes  Intravenous Every 12 hours 04/02/19 0100 04/03/19 1635   04/02/19 0200  piperacillin-tazobactam (ZOSYN) IVPB 3.375 g  Status:  Discontinued     3.375 g 12.5 mL/hr over 240 Minutes Intravenous Every 8 hours 04/02/19 0059 04/02/19 1219   04/02/19 0200  vancomycin (VANCOCIN) 1,500 mg in sodium chloride 0.9 % 500 mL IVPB     1,500 mg 250 mL/hr over 120 Minutes Intravenous  Once 04/02/19 0100 04/02/19 0923   04/01/19 2040  ceFAZolin (ANCEF) 2-4 GM/100ML-% IVPB    Note to Pharmacy: Channing Mutters   : cabinet override      04/01/19 2040 04/02/19 0844         Subjective: Seen and examined.  No overnight events.  Wanted to sleep as she did not get good night sleep. Objective: Vitals:   05/09/19 2230 05/10/19 0510 05/10/19 0906 05/10/19 1248  BP: 107/76 97/72 108/77 104/75  Pulse: (!) 105 99 98 100  Resp:    18  Temp: 98.7 F (37.1 C) 98.4 F (36.9 C) 98.6 F (37 C) (!) 97.4 F (36.3 C)  TempSrc: Oral Oral Oral Oral  SpO2: 98% 100% 100% 99%  Weight:  72.1 kg    Height:        Intake/Output Summary (Last 24 hours) at 05/10/2019 1319 Last data filed at 05/10/2019 0510 Gross per 24 hour  Intake 360 ml  Output --  Net 360 ml   Filed Weights   05/08/19 0633 05/09/19 0505 05/10/19 0510  Weight: 74.2 kg 72.8 kg 72.1 kg    Examination:  General exam: Appears calm and comfortable, on room air.  Sitting in couch.  Earlier she was walking around. Respiratory system: Clear to auscultation. Respiratory effort normal.  Midline surgical scar looks clean and nontender. Cardiovascular system: S1 & S2 heard, RRR.  Central nervous system: Alert and oriented. No focal neurological deficits. Extremities: Symmetric 5 x 5 power. Skin: No rashes, lesions or ulcers PICC line looks clean and nontender. No joint tenderness.    Data Reviewed: I have personally reviewed following labs and imaging studies  CBC: Recent Labs  Lab 05/04/19 0400 05/05/19 0320 05/06/19 0438 05/07/19 0500  05/08/19 0452  WBC 6.0 5.2 6.0 6.6 6.8  HGB 7.7* 7.6* 7.8* 8.0* 8.3*  HCT 24.7* 25.1* 24.7* 25.8* 26.8*  MCV 87.0 87.8 86.1 87.8 87.3  PLT 345 376 437* 487* 568*   Basic Metabolic Panel: Recent Labs  Lab 05/05/19 0320  05/06/19 0438 05/07/19 0500 05/08/19 0452 05/09/19 0500  NA 135 138 138 137 139  K 3.9 3.9 4.0 4.0 4.1  CL 104 106 103 103 103  CO2 24 21* 25 26 27   GLUCOSE 100* 95 120* 92 94  BUN 8 9 9 7 9   CREATININE 0.69 0.63 0.71 0.64 0.68  CALCIUM 8.5* 8.6* 8.5* 8.7* 8.8*   GFR: Estimated Creatinine Clearance: 104.5 mL/min (by C-G formula based on SCr of 0.68 mg/dL). Liver Function Tests: No results for input(s): AST, ALT, ALKPHOS, BILITOT, PROT, ALBUMIN in the last 168 hours. No results for input(s): LIPASE, AMYLASE in the last 168 hours. No results for input(s): AMMONIA in the last 168 hours. Coagulation Profile: No results for input(s): INR, PROTIME in the last 168 hours. Cardiac Enzymes: No results for input(s): CKTOTAL, CKMB, CKMBINDEX, TROPONINI in the last 168 hours. BNP (last 3 results) No results for input(s): PROBNP in the last 8760 hours. HbA1C: No results for input(s): HGBA1C in the last 72 hours. CBG: No results for input(s): GLUCAP in the last 168 hours. Lipid Profile: No results for input(s): CHOL, HDL, LDLCALC, TRIG, CHOLHDL, LDLDIRECT in the last 72 hours. Thyroid Function Tests: No results for input(s): TSH, T4TOTAL, FREET4, T3FREE, THYROIDAB in the last 72 hours. Anemia Panel: No results for input(s): VITAMINB12, FOLATE, FERRITIN, TIBC, IRON, RETICCTPCT in the last 72 hours. Sepsis Labs: No results for input(s): PROCALCITON, LATICACIDVEN in the last 168 hours.  Recent Results (from the past 240 hour(s))  Culture, blood (routine x 2)     Status: None (Preliminary result)   Collection Time: 05/06/19 12:14 PM   Specimen: BLOOD LEFT HAND  Result Value Ref Range Status   Specimen Description BLOOD LEFT HAND  Final   Special Requests   Final     BOTTLES DRAWN AEROBIC ONLY Blood Culture results may not be optimal due to an inadequate volume of blood received in culture bottles   Culture   Final    NO GROWTH 4 DAYS Performed at [facility], [address] [place], [place] [address]    Report Status PENDING  Incomplete  Culture, blood (routine x 2)     Status: None (Preliminary result)   Collection Time: 05/06/19 12:32 PM   Specimen: BLOOD LEFT HAND  Result Value Ref Range Status   Specimen Description BLOOD LEFT HAND  Final   Special Requests   Final    BOTTLES DRAWN AEROBIC ONLY Blood Culture results may not be optimal due to an inadequate volume of blood received in culture bottles   Culture   Final    NO GROWTH 4 DAYS Performed at [facility], [address] [place], [place] [address]    Report Status PENDING  Incomplete         Radiology Studies: No results found.      Scheduled Meds: . aspirin EC  81 mg Oral Daily   Or  . aspirin  81 mg Per Tube Daily  . bisacodyl  10 mg Oral Daily   Or  . bisacodyl  10 mg Rectal Daily  . busPIRone  7.5 mg Oral BID  . Chlorhexidine Gluconate Cloth  6 each Topical Daily  . clopidogrel  75 mg Oral Daily  . clotrimazole  1 Applicatorful Vaginal QHS  . [facility] Family Education   Does not apply Once  . docusate sodium  200 mg Oral Daily  . enoxaparin (LOVENOX) injection  30 mg Subcutaneous  QHS  . escitalopram  10 mg Oral Daily  . feeding supplement (ENSURE ENLIVE)  237 mL Oral BID BM  . ferrous fumarate-b12-vitamic C-folic acid  1 capsule Oral BID PC  . furosemide  40 mg Oral Daily  . metoprolol tartrate  12.5 mg Oral BID   Or  . metoprolol tartrate  12.5 mg Per Tube BID  . pantoprazole  40 mg Oral Daily  . pneumococcal 23 valent vaccine  0.5 mL Intramuscular Tomorrow-1000  . potassium chloride  20 mEq Oral TID  . QUEtiapine  25 mg Oral QHS  . sodium chloride flush  3 mL Intravenous Q12H   Continuous Infusions: . sodium  chloride    . sodium chloride    . cefTRIAXone (ROCEPHIN)  IV 2 g (05/10/19 0921)  . lactated ringers Stopped (04/30/19 2141)  . vancomycin 750 mg (05/10/19 0505)     LOS: 39 days    Time spent: 20 minutes    Dorcas Carrow, MD [facility] Pager [phone]

## 2019-05-11 LAB — CBC WITH DIFFERENTIAL/PLATELET
Abs Immature Granulocytes: 0.01 10*3/uL (ref 0.00–0.07)
Basophils Absolute: 0.1 10*3/uL (ref 0.0–0.1)
Basophils Relative: 1 %
Eosinophils Absolute: 0.3 10*3/uL (ref 0.0–0.5)
Eosinophils Relative: 5 %
HCT: 27.8 % — ABNORMAL LOW (ref 36.0–46.0)
Hemoglobin: 8.5 g/dL — ABNORMAL LOW (ref 12.0–15.0)
Immature Granulocytes: 0 %
Lymphocytes Relative: 36 %
Lymphs Abs: 2 10*3/uL (ref 0.7–4.0)
MCH: 26.9 pg (ref 26.0–34.0)
MCHC: 30.6 g/dL (ref 30.0–36.0)
MCV: 88 fL (ref 80.0–100.0)
Monocytes Absolute: 0.4 10*3/uL (ref 0.1–1.0)
Monocytes Relative: 7 %
Neutro Abs: 2.9 10*3/uL (ref 1.7–7.7)
Neutrophils Relative %: 51 %
Platelets: 596 10*3/uL — ABNORMAL HIGH (ref 150–400)
RBC: 3.16 MIL/uL — ABNORMAL LOW (ref 3.87–5.11)
RDW: 18 % — ABNORMAL HIGH (ref 11.5–15.5)
WBC: 5.6 10*3/uL (ref 4.0–10.5)
nRBC: 0 % (ref 0.0–0.2)

## 2019-05-11 LAB — BASIC METABOLIC PANEL
Anion gap: 11 (ref 5–15)
BUN: 17 mg/dL (ref 6–20)
CO2: 26 mmol/L (ref 22–32)
Calcium: 8.6 mg/dL — ABNORMAL LOW (ref 8.9–10.3)
Chloride: 102 mmol/L (ref 98–111)
Creatinine, Ser: 0.61 mg/dL (ref 0.44–1.00)
GFR calc Af Amer: >60 mL/min (ref >60)
GFR calc non Af Amer: >60 mL/min (ref >60)
Glucose, Bld: 86 mg/dL (ref 70–99)
Potassium: 4.1 mmol/L (ref 3.5–5.1)
Sodium: 139 mmol/L (ref 135–145)

## 2019-05-11 LAB — MAGNESIUM: Magnesium: 1.9 mg/dL (ref 1.7–2.4)

## 2019-05-11 LAB — CULTURE, BLOOD (ROUTINE X 2)
Culture: NO GROWTH
Culture: NO GROWTH

## 2019-05-11 LAB — PHOSPHORUS: Phosphorus: 4.5 mg/dL (ref 2.5–4.6)

## 2019-05-11 MED ORDER — DIPHENHYDRAMINE HCL 25 MG PO CAPS
25.0000 mg | ORAL_CAPSULE | Freq: Every evening | ORAL | Status: DC | PRN
  Administered 2019-05-11 – 2019-06-01 (×22): 50 mg via ORAL
  Administered 2019-06-02 (×2): 25 mg via ORAL
  Administered 2019-06-03 – 2019-06-08 (×6): 50 mg via ORAL
  Filled 2019-05-11 (×6): qty 2
  Filled 2019-05-11: qty 1
  Filled 2019-05-11 (×16): qty 2
  Filled 2019-05-11: qty 1
  Filled 2019-05-11 (×5): qty 2
  Filled 2019-05-11: qty 1
  Filled 2019-05-11 (×2): qty 2

## 2019-05-11 NOTE — Progress Notes (Signed)
CARDIAC REHAB PHASE I   PRE:  Rate/Rhythm: 103 ST    BP: sitting 101/76    SaO2: 100 RA  MODE:  Ambulation: 790 ft   POST:  Rate/Rhythm: 123 ST    BP: sitting 107/73     SaO2: 100 RA  tolerated well.  No c/o. Tired after walk. Bored, discussed things to do. [id number]  Harriet Masson CES, ACSM 05/11/2019 9:26 AM

## 2019-05-11 NOTE — Progress Notes (Signed)
PROGRESS NOTE    Sevannah Dollene Mallery  IHW:[id number] DOB: 07-Sep-1990 DOA: 04/01/2019 PCP: Patient, No Pcp Per    Brief Narrative:  29 year old female with past medical history with substance abuse with heroin, crystal meth and cocaine as well as smoking presents to the ER with aphasia and right-sided weakness and code stroke.  Admitted to neurology ICU.  Patient was intubated underwent thrombectomy for left MCA M3 occlusion and recanalization.  This was complicated by dissection and pseudoaneurysm of the left ICA SP stent placement. Extubated on April 04, 2019.  Also had a DVT but treated with IVC filter.  TEE showed infective endocarditis. S/P bioprosthetic aortic valve replacement and tricuspid valve repair on 4/20 Currently further plan is continue IV antibiotics for endocarditis until 5/31  4/28-4/29: Patient has low-grade temperature 100 degree and tachycardia.  Blood cultures drawn on 4/28.  No growth so far.  Temperature improved. 5/3: No complaints except not getting good night sleep.  Assessment & Plan:   Active Problems:   Acute ischemic left MCA stroke (HCC)   Middle cerebral artery embolism, left   DVT (deep venous thrombosis) (HCC)   Encounter for central line placement   Endotracheal tube present   Cerebrovascular accident (CVA) due to embolism of precerebral artery (HCC)   Infective endocarditis   Normocytic anemia   Polysubstance abuse (HCC)   IVDU (intravenous drug user)   Tachypnea   Endocarditis  1.  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 Severe AR. SP aortic valve replacement with bioprosthetic valve and tricuspid repair. MSSA in sputum.  Suspected CMV infection Initially presented with code stroke, intubated and admitted to the neuro ICU. Tracheal aspirate grew MSSA. So far tissue culture with no growth to date. Remains on IV vancomycin and IV ceftriaxone. 6 weeks of antibiotics post surgery through Jun 08, 2019. On the path report  staining looks like CMV in some areas.  Followed by ID. PICC line inserted. Patient will remain in a supervised setting for completion of the antibiotics. Repeat cultures 4/28 with no growth.  No fever. Still remains sinus tachycardia with no fever, blood pressures low normal, no room to increase beta-blockers.  Once blood pressures more than 100, will increase metoprolol.  2.  Aortic valve endocarditis. SP aortic valve replacement 04/28/2019 and tricuspid repair Cardiothoracic surgery following. Clinically improving.  On room air.  Beta-blockers and Lasix with potassium supplements adjusted. Electrolytes remained stable.  3.  Acute CVA of the MCA territory.  Embolic in nature. TPA was deferred as concern for septic emboli. SP left common carotid arteriogram with complete revascularization of the occluded M3. Complicated by dissection and pseudoaneurysm of the left ICA S/P placement of 4.5 mm x 25 mm pipeline flow diverter stent. Initially was on aspirin and Brilinta.  Brilinta was held for cardiothoracic surgery. Patient has been off Brilinta but on aspirin for cardiothoracic surgery. Patient has intracranial stenting therefore will require dual antiplatelet therapy with aspirin and Plavix per neurology. Continue this for 3 to 6 months. Patient will follow up with Dr. Corliss Skains and decide on exact regimen. Patient will also require follow-up with neurology.  4.  Right common femoral DVT, Right groin hematoma, IVC filter placement April 03, 2019. During the hospitalization patient was found to have right common femoral DVT. Patient also was found to have large right groin hematoma post vascular intervention Decision was made to place IVC filter placement since the patient was felt not a candidate for therapeutic anticoagulation given her septic emboli, dissection as well  as hematoma.  5.  Acute blood loss anemia. Iron deficiency anemia. B12 deficiency. Folate deficiency. Continue  supplementation. H&H relatively stable. No active bleeding identified. No GI bleed. Hemoglobin is stable.  6.  Polysubstance abuse. UDS positive for THC, amphetamine admission. Suspected IV drug use as well with needle marks. Currently on oral pain medication. Out of the window for any withdrawals.  7.  Anxiety. Currently on BuSpar, Lexapro, Seroquel.  May not require Seroquel at discharge. As needed Ativan.  8.  Hepatitis C antibody positive. Negative viral load. HIV screen negative. RPR negative. Will require outpatient follow-up and therapy.  9.  Pain control. On as needed oxycodone.  Suggested patient to use Tylenol and decrease dose of oxycodone. Tapering off oxycodone.  She is using only 1 tablet a day.   DVT prophylaxis: Heparin subcu Code Status: Full code Family Communication: None Disposition Plan: Status is: Inpatient  Remains inpatient appropriate because:IV treatments appropriate due to intensity of illness or inability to take PO   Dispo: The patient is from: Home              Anticipated d/c is to: Home              Anticipated d/c date is: > 3 days              Patient currently is not medically stable to d/c.          Consultants:   CT surgery  Infectious disease  Procedures:   Aortic valve replacement, 4/20  Antimicrobials:  Anti-infectives (From admission, onward)   Start     Dose/Rate Route Frequency Ordered Stop   05/03/19 1000  fluconazole (DIFLUCAN) tablet 150 mg     150 mg Oral  Once 05/03/19 0804 05/03/19 1243   04/28/19 0400  vancomycin (VANCOREADY) IVPB 1250 mg/250 mL  Status:  Discontinued     1,250 mg 166.7 mL/hr over 90 Minutes Intravenous To Surgery 04/27/19 1131 04/28/19 1451   04/28/19 0400  cefUROXime (ZINACEF) 1.5 g in sodium chloride 0.9 % 100 mL IVPB     1.5 g 200 mL/hr over 30 Minutes Intravenous To Surgery 04/27/19 1131 04/28/19 0819   04/28/19 0400  cefUROXime (ZINACEF) 750 mg in sodium chloride 0.9 % 100  mL IVPB     750 mg 200 mL/hr over 30 Minutes Intravenous To Surgery 04/27/19 1131 04/28/19 1313   04/15/19 0900  vancomycin (VANCOREADY) IVPB 750 mg/150 mL     750 mg 150 mL/hr over 60 Minutes Intravenous Every 8 hours 04/15/19 0821     04/13/19 1400  vancomycin (VANCOCIN) IVPB 1000 mg/200 mL premix  Status:  Discontinued     1,000 mg 200 mL/hr over 60 Minutes Intravenous Every 8 hours 04/13/19 0839 04/15/19 0821   04/12/19 2200  vancomycin (VANCOREADY) IVPB 750 mg/150 mL  Status:  Discontinued     750 mg 150 mL/hr over 60 Minutes Intravenous Every 8 hours 04/12/19 1500 04/13/19 0839   04/10/19 1336  vancomycin (VANCOCIN) IVPB 1000 mg/200 mL premix  Status:  Discontinued     1,000 mg 200 mL/hr over 60 Minutes Intravenous Every 8 hours 04/10/19 1320 04/12/19 1500   04/08/19 1200  cefTRIAXone (ROCEPHIN) 2 g in sodium chloride 0.9 % 100 mL IVPB     2 g 200 mL/hr over 30 Minutes Intravenous Every 12 hours 04/08/19 1013 06/09/19 2359   04/08/19 0530  vancomycin (VANCOCIN) IVPB 1000 mg/200 mL premix  Status:  Discontinued     1,000  mg 200 mL/hr over 60 Minutes Intravenous Every 12 hours 04/07/19 1541 04/10/19 1320   04/07/19 1900  Ampicillin-Sulbactam (UNASYN) 3 g in sodium chloride 0.9 % 100 mL IVPB  Status:  Discontinued     3 g 200 mL/hr over 30 Minutes Intravenous Every 6 hours 04/07/19 1457 04/08/19 1013   04/07/19 1730  vancomycin (VANCOREADY) IVPB 1500 mg/300 mL     1,500 mg 150 mL/hr over 120 Minutes Intravenous  Once 04/07/19 1541 04/07/19 1937   04/04/19 1400  ceFAZolin (ANCEF) IVPB 2g/100 mL premix  Status:  Discontinued     2 g 200 mL/hr over 30 Minutes Intravenous Every 8 hours 04/04/19 1119 04/07/19 1457   04/04/19 0400  Vancomycin (VANCOCIN) 1,250 mg in sodium chloride 0.9 % 250 mL IVPB  Status:  Discontinued     1,250 mg 166.7 mL/hr over 90 Minutes Intravenous Every 12 hours 04/03/19 1635 04/04/19 1159   04/02/19 1800  ceFEPIme (MAXIPIME) 2 g in sodium chloride 0.9 % 100 mL  IVPB  Status:  Discontinued     2 g 200 mL/hr over 30 Minutes Intravenous Every 8 hours 04/02/19 1218 04/04/19 1127   04/02/19 1600  vancomycin (VANCOCIN) IVPB 1000 mg/200 mL premix  Status:  Discontinued     1,000 mg 200 mL/hr over 60 Minutes Intravenous Every 12 hours 04/02/19 0100 04/03/19 1635   04/02/19 0200  piperacillin-tazobactam (ZOSYN) IVPB 3.375 g  Status:  Discontinued     3.375 g 12.5 mL/hr over 240 Minutes Intravenous Every 8 hours 04/02/19 0059 04/02/19 1219   04/02/19 0200  vancomycin (VANCOCIN) 1,500 mg in sodium chloride 0.9 % 500 mL IVPB     1,500 mg 250 mL/hr over 120 Minutes Intravenous  Once 04/02/19 0100 04/02/19 0923   04/01/19 2040  ceFAZolin (ANCEF) 2-4 GM/100ML-% IVPB    Note to Pharmacy: Channing Mutters   : cabinet override      04/01/19 2040 04/02/19 0844         Subjective: Seen and examined.  No overnight events. Walking around. Not getting good sleep. Objective: Vitals:   05/10/19 2245 05/11/19 0500 05/11/19 0510 05/11/19 0741  BP: 96/70  98/70 102/70  Pulse: (!) 111  99 (!) 104  Resp:    18  Temp: 98.5 F (36.9 C)  98.4 F (36.9 C) 98.3 F (36.8 C)  TempSrc: Oral  Oral Oral  SpO2: 99%  98% 100%  Weight:  72.1 kg    Height:       No intake or output data in the 24 hours ending 05/11/19 0804 Filed Weights   05/09/19 0505 05/10/19 0510 05/11/19 0500  Weight: 72.8 kg 72.1 kg 72.1 kg    Examination:  General exam: Appears calm and comfortable, on room air.  Sitting in couch.  Respiratory system: Clear to auscultation. Respiratory effort normal.  Midline surgical scar looks clean and nontender. Cardiovascular system: S1 & S2 heard, RRR.   Central nervous system: Alert and oriented. No focal neurological deficits. Extremities: Symmetric 5 x 5 power. Skin: No rashes, lesions or ulcers PICC line looks clean and nontender. No joint tenderness.    Data Reviewed: I have personally reviewed following labs and imaging studies  CBC: Recent  Labs  Lab 05/05/19 0320 05/06/19 0438 05/07/19 0500 05/08/19 0452 05/11/19 0500  WBC 5.2 6.0 6.6 6.8 5.6  NEUTROABS  --   --   --   --  2.9  HGB 7.6* 7.8* 8.0* 8.3* 8.5*  HCT 25.1* 24.7* 25.8* 26.8*  27.8*  MCV 87.8 86.1 87.8 87.3 88.0  PLT 376 437* 487* 568* 824*   Basic Metabolic Panel: Recent Labs  Lab 05/06/19 0438 05/07/19 0500 05/08/19 0452 05/09/19 0500 05/11/19 0500  NA 138 138 137 139 139  K 3.9 4.0 4.0 4.1 4.1  CL 106 103 103 103 102  CO2 21* 25 26 27 26   GLUCOSE 95 120* 92 94 86  BUN 9 9 7 9 17   CREATININE 0.63 0.71 0.64 0.68 0.61  CALCIUM 8.6* 8.5* 8.7* 8.8* 8.6*  MG  --   --   --   --  1.9  PHOS  --   --   --   --  4.5   GFR: Estimated Creatinine Clearance: 104.5 mL/min (by C-G formula based on SCr of 0.61 mg/dL). Liver Function Tests: No results for input(s): AST, ALT, ALKPHOS, BILITOT, PROT, ALBUMIN in the last 168 hours. No results for input(s): LIPASE, AMYLASE in the last 168 hours. No results for input(s): AMMONIA in the last 168 hours. Coagulation Profile: No results for input(s): INR, PROTIME in the last 168 hours. Cardiac Enzymes: No results for input(s): CKTOTAL, CKMB, CKMBINDEX, TROPONINI in the last 168 hours. BNP (last 3 results) No results for input(s): PROBNP in the last 8760 hours. HbA1C: No results for input(s): HGBA1C in the last 72 hours. CBG: No results for input(s): GLUCAP in the last 168 hours. Lipid Profile: No results for input(s): CHOL, HDL, LDLCALC, TRIG, CHOLHDL, LDLDIRECT in the last 72 hours. Thyroid Function Tests: No results for input(s): TSH, T4TOTAL, FREET4, T3FREE, THYROIDAB in the last 72 hours. Anemia Panel: No results for input(s): VITAMINB12, FOLATE, FERRITIN, TIBC, IRON, RETICCTPCT in the last 72 hours. Sepsis Labs: No results for input(s): PROCALCITON, LATICACIDVEN in the last 168 hours.  Recent Results (from the past 240 hour(s))  Culture, blood (routine x 2)     Status: None (Preliminary result)    Collection Time: 05/06/19 12:14 PM   Specimen: BLOOD LEFT HAND  Result Value Ref Range Status   Specimen Description BLOOD LEFT HAND  Final   Special Requests   Final    BOTTLES DRAWN AEROBIC ONLY Blood Culture results may not be optimal due to an inadequate volume of blood received in culture bottles   Culture   Final    NO GROWTH 4 DAYS Performed at [facility], [place] [address] [place], [place] [address]    Report Status PENDING  Incomplete  Culture, blood (routine x 2)     Status: None (Preliminary result)   Collection Time: 05/06/19 12:32 PM   Specimen: BLOOD LEFT HAND  Result Value Ref Range Status   Specimen Description BLOOD LEFT HAND  Final   Special Requests   Final    BOTTLES DRAWN AEROBIC ONLY Blood Culture results may not be optimal due to an inadequate volume of blood received in culture bottles   Culture   Final    NO GROWTH 4 DAYS Performed at [facility], [place] [address], [place], [place] [address]    Report Status PENDING  Incomplete         Radiology Studies: No results found.      Scheduled Meds: . aspirin EC  81 mg Oral Daily   Or  . aspirin  81 mg Per Tube Daily  . bisacodyl  10 mg Oral Daily   Or  . bisacodyl  10 mg Rectal Daily  . busPIRone  7.5 mg Oral BID  . Chlorhexidine Gluconate  Cloth  6 each Topical Daily  . clopidogrel  75 mg Oral Daily  . [facility], Patient [facility] Family Education   Does not apply Once  . docusate sodium  200 mg Oral Daily  . enoxaparin (LOVENOX) injection  30 mg Subcutaneous QHS  . escitalopram  10 mg Oral Daily  . feeding supplement (ENSURE ENLIVE)  237 mL Oral BID BM  . ferrous fumarate-b12-vitamic C-folic acid  1 capsule Oral BID PC  . furosemide  40 mg Oral Daily  . metoprolol tartrate  12.5 mg Oral BID   Or  . metoprolol tartrate  12.5 mg Per Tube BID  . pantoprazole  40 mg Oral Daily  . pneumococcal 23 valent vaccine  0.5 mL Intramuscular Tomorrow-1000  . potassium chloride  20  mEq Oral TID  . QUEtiapine  25 mg Oral QHS  . sodium chloride flush  3 mL Intravenous Q12H   Continuous Infusions: . sodium chloride    . sodium chloride    . cefTRIAXone (ROCEPHIN)  IV 2 g (05/10/19 2157)  . lactated ringers Stopped (04/30/19 2141)  . vancomycin 750 mg (05/11/19 0513)     LOS: 40 days    Time spent: 20 minutes    Dorcas CarrowKuber Cumi Sanagustin, MD [facility] Pager [phone]

## 2019-05-11 NOTE — Progress Notes (Addendum)
      [address]       [place] [address]             [phone]      13 Days Post-Op Procedure(s) (LRB): AORTIC VALVE REPLACEMENT (AVR) using Edwards PERIMOUNT Magna Ease Pericardial Bioprosthesis - 29 MM Aortic Valve. (N/A) REPAIR OF TRICUSPID (N/A) TRANSESOPHAGEAL ECHOCARDIOGRAM (TEE) (N/A) Subjective: Still not getting much sleep.   Objective: Vital signs in last 24 hours: Temp:  [97.4 F (36.3 C)-98.7 F (37.1 C)] 98.4 F (36.9 C) (05/03 0510) Pulse Rate:  [98-113] 99 (05/03 0510) Cardiac Rhythm: Sinus tachycardia (05/03 0510) Resp:  [18-20] 20 (05/02 2038) BP: (88-108)/(52-77) 98/70 (05/03 0510) SpO2:  [98 %-100 %] 98 % (05/03 0510) Weight:  [72.1 kg] 72.1 kg (05/03 0500)     Intake/Output from previous day: No intake/output data recorded. Intake/Output this shift: No intake/output data recorded.  General appearance: alert, cooperative and no distress Heart: regular rate and rhythm, S1, S2 normal, no murmur, click, rub or gallop Lungs: clear to auscultation bilaterally Abdomen: soft, non-tender; bowel sounds normal; no masses,  no organomegaly Extremities: extremities normal, atraumatic, no cyanosis or edema Wound: clean and dry, healing well.   Lab Results: Recent Labs    05/11/19 0500  WBC 5.6  HGB 8.5*  HCT 27.8*  PLT 596*   BMET:  Recent Labs    05/09/19 0500 05/11/19 0500  NA 139 139  K 4.1 4.1  CL 103 102  CO2 27 26  GLUCOSE 94 86  BUN 9 17  CREATININE 0.68 0.61  CALCIUM 8.8* 8.6*    PT/INR: No results for input(s): LABPROT, INR in the last 72 hours. ABG    Component Value Date/Time   PHART 7.365 04/28/2019 2205   HCO3 24.5 04/28/2019 2205   TCO2 26 04/28/2019 2205   ACIDBASEDEF 1.0 04/28/2019 2205   O2SAT 97.0 04/28/2019 2205   CBG (last 3)  No results for input(s): GLUCAP in the last 72 hours.  Assessment/Plan: S/P Procedure(s) (LRB): AORTIC VALVE REPLACEMENT (AVR) using Edwards PERIMOUNT Magna Ease  Pericardial Bioprosthesis - 29 MM Aortic Valve. (N/A) REPAIR OF TRICUSPID (N/A) TRANSESOPHAGEAL ECHOCARDIOGRAM (TEE) (N/A)   1. CV-sinus tachycardia, hemodynamically stable 2. Pulm-tolerating room air,CXR today showed an improvement in her pleural effusions. 3. Volume Overload - oral lasix 40mg  daily.Continue daily weights.Creatinine 0.61, electrolytes okay.  Repeat cultures nehgat9ive so fatr 4. Expected post op acute blood loss anemia - H and H this8.5/27.8Continue Trinsicon. 5. ID-on Vancomycin and Ceftriaxone, blood cultures sent on 4/28 and are NTD. No recent fevers. Will continue until 5/31 6. Neuro-CVA. Will need DAPT for 3-6 months. Patient to follow-up in the stroke clinic 7. GI-tolerating current diet recommendations 8. Hx of substance abuse-assistance needed for rehab placement. CM and social work consulted. 9. Yeast infection-improving 10. Insomnia-Benadryl 25-50mg  at bedtime for sleep.  11. Asking about the COVID 19 vaccine this morning-not currently offered in the hospital. Encouraged to get vaccine as soon as she leaves the hospital.   Plan: Continue IV antibiotics until 5/31. Her mother is coming to visit today and bringing a body pillow to help with side sleeping. Otherwise, medically stable and progressing.    LOS: 40 days    6/31 05/11/2019  Repeat cultures negative so far  I have seen and examined Lauren Reyes and agree with the above assessment  and plan.  09-23-1970 MD Beeper [phone] Office [phone] 05/11/2019 4:42 PM

## 2019-05-12 ENCOUNTER — Encounter (HOSPITAL_COMMUNITY): Payer: Self-pay | Admitting: Infectious Disease

## 2019-05-12 NOTE — Progress Notes (Addendum)
      [address]       [place] [address]             [phone]      14 Days Post-Op Procedure(s) (LRB): AORTIC VALVE REPLACEMENT (AVR) using Edwards PERIMOUNT Magna Ease Pericardial Bioprosthesis - 29 MM Aortic Valve. (N/A) REPAIR OF TRICUSPID (N/A) TRANSESOPHAGEAL ECHOCARDIOGRAM (TEE) (N/A)   Subjective:  No new complaints.  Just bored.  Doing very well, ambulating frequently throughout the day.  Objective: Vital signs in last 24 hours: Temp:  [97.6 F (36.4 C)-100 F (37.8 C)] 98.7 F (37.1 C) (05/04 0758) Pulse Rate:  [96-101] 101 (05/04 0758) Cardiac Rhythm: Sinus tachycardia (05/04 0700) Resp:  [14-18] 14 (05/04 0758) BP: (93-107)/(66-75) 99/69 (05/04 0846) SpO2:  [98 %-100 %] 99 % (05/04 0449) Weight:  [72.9 kg] 72.9 kg (05/04 0449)  Intake/Output from previous day: 05/03 0701 - 05/04 0700 In: 880 [P.O.:480; IV Piggyback:400] Out: -   General appearance: alert, cooperative and no distress Heart: regular rate and rhythm Lungs: clear to auscultation bilaterally Abdomen: soft, non-tender; bowel sounds normal; no masses,  no organomegaly Extremities: extremities normal, atraumatic, no cyanosis or edema Wound: clean and dry  Lab Results: Recent Labs    05/11/19 0500  WBC 5.6  HGB 8.5*  HCT 27.8*  PLT 596*   BMET:  Recent Labs    05/11/19 0500  NA 139  K 4.1  CL 102  CO2 26  GLUCOSE 86  BUN 17  CREATININE 0.61  CALCIUM 8.6*    PT/INR: No results for input(s): LABPROT, INR in the last 72 hours. ABG    Component Value Date/Time   PHART 7.365 04/28/2019 2205   HCO3 24.5 04/28/2019 2205   TCO2 26 04/28/2019 2205   ACIDBASEDEF 1.0 04/28/2019 2205   O2SAT 97.0 04/28/2019 2205   CBG (last 3)  No results for input(s): GLUCAP in the last 72 hours.  Assessment/Plan: S/P Procedure(s) (LRB): AORTIC VALVE REPLACEMENT (AVR) using Edwards PERIMOUNT Magna Ease Pericardial Bioprosthesis - 29 MM Aortic Valve. (N/A) REPAIR OF  TRICUSPID (N/A) TRANSESOPHAGEAL ECHOCARDIOGRAM (TEE) (N/A)  1. CV- NSR, BP remains labile on Lopressor 12.5 mg BID 2. Pulm- no acute issues, continue IS 3. Renal- weight is trending down, almost to baseline, continue Lasix 4. ID- continue Vanc, Rocephin, blood cultures remain negative, stop date will be 5/31 5. Dispo- patient stable, continue current care   LOS: 41 days    Erin Barrett, PA-C  05/12/2019  Last chest xray 4/30 - significant improvement , decreased effusions  Wound intact, no Murmur  I have seen and examined Lauren Reyes and agree with the above assessment  and plan.  Delight Ovens MD Beeper [phone] Office [phone] 05/12/2019 10:10 AM

## 2019-05-12 NOTE — Progress Notes (Signed)
CARDIAC REHAB PHASE I   PRE:  Rate/Rhythm: 95 SR    BP: sitting 84/53    SaO2:   MODE:  Ambulation: 790 ft   POST:  Rate/Rhythm: 106 ST    BP: sitting 97/78     SaO2:   Tolerated well. 3rd walk today. BP low but pt asx.  [id number]   Harriet Masson CES, ACSM 05/12/2019 2:14 PM

## 2019-05-12 NOTE — Progress Notes (Signed)
PROGRESS NOTE    Hiedi Leonard SchwartzWanjiku Raval  UJW:[id number] DOB: 08/16/1990 DOA: 04/01/2019 PCP: Patient, No Pcp Per    Brief Narrative:  29 year old female with past medical history with substance abuse with heroin, crystal meth and cocaine as well as smoking presents to the ER with aphasia and right-sided weakness and code stroke.  Admitted to neurology ICU.  Patient was intubated underwent thrombectomy for left MCA M3 occlusion and recanalization.  This was complicated by dissection and pseudoaneurysm of the left ICA SP stent placement. Extubated on April 04, 2019.  Also had a DVT but treated with IVC filter.  TEE showed infective endocarditis. S/P bioprosthetic aortic valve replacement and tricuspid valve repair on 4/20 Currently further plan is continue IV antibiotics for endocarditis until 5/31  4/28-4/29: Patient has low-grade temperature 100 degree and tachycardia.  Blood cultures drawn on 4/28.  No growth so far.  Temperature improved.  Assessment & Plan:   Active Problems:   Acute ischemic left MCA stroke (HCC)   Middle cerebral artery embolism, left   DVT (deep venous thrombosis) (HCC)   Encounter for central line placement   Endotracheal tube present   Cerebrovascular accident (CVA) due to embolism of precerebral artery (HCC)   Infective endocarditis   Normocytic anemia   Polysubstance abuse (HCC)   IVDU (intravenous drug user)   Tachypnea   Endocarditis  1.  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 Severe AR. SP aortic valve replacement with bioprosthetic valve and tricuspid repair. MSSA in sputum.  Suspected CMV infection Initially presented with code stroke, intubated and admitted to the neuro ICU. Tracheal aspirate grew MSSA. So far tissue culture with no growth to date. Remains on IV vancomycin and IV ceftriaxone. 6 weeks of antibiotics post surgery through Jun 08, 2019. On the path report staining looks like CMV in some areas.  Followed by  ID. PICC line inserted. Patient will remain in a supervised setting for completion of the antibiotics. Repeat cultures 4/28 with no growth.  No fever. Still remains sinus tachycardia with no fever, blood pressures low normal, no room to increase beta-blockers.  Once blood pressures more than 100, will increase metoprolol.  2.  Aortic valve endocarditis. SP aortic valve replacement 04/28/2019 and tricuspid repair Cardiothoracic surgery following. Clinically improving.  On room air.  Beta-blockers and Lasix with potassium supplements adjusted. Electrolytes remained stable.  3.  Acute CVA of the MCA territory.  Embolic in nature. TPA was deferred as concern for septic emboli. SP left common carotid arteriogram with complete revascularization of the occluded M3. Complicated by dissection and pseudoaneurysm of the left ICA S/P placement of 4.5 mm x 25 mm pipeline flow diverter stent. Initially was on aspirin and Brilinta.  Brilinta was held for cardiothoracic surgery. Patient has been off Brilinta but on aspirin for cardiothoracic surgery. Patient has intracranial stenting therefore will require dual antiplatelet therapy with aspirin and Plavix per neurology. Continue this for 3 to 6 months. Patient will follow up with Dr. Corliss Skainsdeveshwar and decide on exact regimen. Patient will also require follow-up with neurology.  4.  Right common femoral DVT, Right groin hematoma, IVC filter placement April 03, 2019. During the hospitalization patient was found to have right common femoral DVT. Patient also was found to have large right groin hematoma post vascular intervention Decision was made to place IVC filter placement since the patient was felt not a candidate for therapeutic anticoagulation given her septic emboli, dissection as well as hematoma.  5.  Acute blood loss anemia.  Iron deficiency anemia. B12 deficiency. Folate deficiency. Continue supplementation. H&H relatively stable. No active  bleeding identified. No GI bleed.  6.  Polysubstance abuse. UDS positive for THC, amphetamine admission. Suspected IV drug use as well with needle marks. Currently on oral pain medication. Out of the window for any withdrawals.  7.  Anxiety. Currently on BuSpar, Lexapro, Seroquel.  May not require Seroquel at discharge. As needed Ativan.  8.  Hepatitis C antibody positive. Negative viral load. HIV screen negative. RPR negative. Will require outpatient follow-up and therapy.  9.  Pain control. On as needed oxycodone.  Suggested patient to use Tylenol and decrease dose of oxycodone. Tapering off now.    DVT prophylaxis: Heparin subcu Code Status: Full code Family Communication: None Disposition Plan: Status is: Inpatient  Remains inpatient appropriate because:IV treatments appropriate due to intensity of illness or inability to take PO   Dispo: The patient is from: Home              Anticipated d/c is to: Home              Anticipated d/c date is: > 3 days              Patient currently is not medically stable to d/c.          Consultants:   CT surgery  Infectious disease  Procedures:   Aortic valve replacement, 4/20  Antimicrobials:  Anti-infectives (From admission, onward)   Start     Dose/Rate Route Frequency Ordered Stop   05/03/19 1000  fluconazole (DIFLUCAN) tablet 150 mg     150 mg Oral  Once 05/03/19 0804 05/03/19 1243   04/28/19 0400  vancomycin (VANCOREADY) IVPB 1250 mg/250 mL  Status:  Discontinued     1,250 mg 166.7 mL/hr over 90 Minutes Intravenous To Surgery 04/27/19 1131 04/28/19 1451   04/28/19 0400  cefUROXime (ZINACEF) 1.5 g in sodium chloride 0.9 % 100 mL IVPB     1.5 g 200 mL/hr over 30 Minutes Intravenous To Surgery 04/27/19 1131 04/28/19 0819   04/28/19 0400  cefUROXime (ZINACEF) 750 mg in sodium chloride 0.9 % 100 mL IVPB     750 mg 200 mL/hr over 30 Minutes Intravenous To Surgery 04/27/19 1131 04/28/19 1313   04/15/19 0900   vancomycin (VANCOREADY) IVPB 750 mg/150 mL     750 mg 150 mL/hr over 60 Minutes Intravenous Every 8 hours 04/15/19 0821     04/13/19 1400  vancomycin (VANCOCIN) IVPB 1000 mg/200 mL premix  Status:  Discontinued     1,000 mg 200 mL/hr over 60 Minutes Intravenous Every 8 hours 04/13/19 0839 04/15/19 0821   04/12/19 2200  vancomycin (VANCOREADY) IVPB 750 mg/150 mL  Status:  Discontinued     750 mg 150 mL/hr over 60 Minutes Intravenous Every 8 hours 04/12/19 1500 04/13/19 0839   04/10/19 1336  vancomycin (VANCOCIN) IVPB 1000 mg/200 mL premix  Status:  Discontinued     1,000 mg 200 mL/hr over 60 Minutes Intravenous Every 8 hours 04/10/19 1320 04/12/19 1500   04/08/19 1200  cefTRIAXone (ROCEPHIN) 2 g in sodium chloride 0.9 % 100 mL IVPB     2 g 200 mL/hr over 30 Minutes Intravenous Every 12 hours 04/08/19 1013 06/09/19 2359   04/08/19 0530  vancomycin (VANCOCIN) IVPB 1000 mg/200 mL premix  Status:  Discontinued     1,000 mg 200 mL/hr over 60 Minutes Intravenous Every 12 hours 04/07/19 1541 04/10/19 1320   04/07/19 1900  Ampicillin-Sulbactam (  UNASYN) 3 g in sodium chloride 0.9 % 100 mL IVPB  Status:  Discontinued     3 g 200 mL/hr over 30 Minutes Intravenous Every 6 hours 04/07/19 1457 04/08/19 1013   04/07/19 1730  vancomycin (VANCOREADY) IVPB 1500 mg/300 mL     1,500 mg 150 mL/hr over 120 Minutes Intravenous  Once 04/07/19 1541 04/07/19 1937   04/04/19 1400  ceFAZolin (ANCEF) IVPB 2g/100 mL premix  Status:  Discontinued     2 g 200 mL/hr over 30 Minutes Intravenous Every 8 hours 04/04/19 1119 04/07/19 1457   04/04/19 0400  Vancomycin (VANCOCIN) 1,250 mg in sodium chloride 0.9 % 250 mL IVPB  Status:  Discontinued     1,250 mg 166.7 mL/hr over 90 Minutes Intravenous Every 12 hours 04/03/19 1635 04/04/19 1159   04/02/19 1800  ceFEPIme (MAXIPIME) 2 g in sodium chloride 0.9 % 100 mL IVPB  Status:  Discontinued     2 g 200 mL/hr over 30 Minutes Intravenous Every 8 hours 04/02/19 1218 04/04/19  1127   04/02/19 1600  vancomycin (VANCOCIN) IVPB 1000 mg/200 mL premix  Status:  Discontinued     1,000 mg 200 mL/hr over 60 Minutes Intravenous Every 12 hours 04/02/19 0100 04/03/19 1635   04/02/19 0200  piperacillin-tazobactam (ZOSYN) IVPB 3.375 g  Status:  Discontinued     3.375 g 12.5 mL/hr over 240 Minutes Intravenous Every 8 hours 04/02/19 0059 04/02/19 1219   04/02/19 0200  vancomycin (VANCOCIN) 1,500 mg in sodium chloride 0.9 % 500 mL IVPB     1,500 mg 250 mL/hr over 120 Minutes Intravenous  Once 04/02/19 0100 04/02/19 0923   04/01/19 2040  ceFAZolin (ANCEF) 2-4 GM/100ML-% IVPB    Note to Pharmacy: Channing Mutters   : cabinet override      04/01/19 2040 04/02/19 0844         Subjective: Seen and examined.  No overnight events.  Objective: Vitals:   05/12/19 0449 05/12/19 0758 05/12/19 0846 05/12/19 1138  BP: 93/66  99/69 (!) 92/57  Pulse: 96 (!) 101    Resp: 18 14    Temp: 97.9 F (36.6 C) 98.7 F (37.1 C)  97.7 F (36.5 C)  TempSrc: Oral Oral  Oral  SpO2: 99%   100%  Weight: 72.9 kg     Height:        Intake/Output Summary (Last 24 hours) at 05/12/2019 1149 Last data filed at 05/12/2019 0529 Gross per 24 hour  Intake 640 ml  Output --  Net 640 ml   Filed Weights   05/10/19 0510 05/11/19 0500 05/12/19 0449  Weight: 72.1 kg 72.1 kg 72.9 kg    Examination:  General exam: Appears calm and comfortable, on room air.  Sitting in couch.  Respiratory system: Clear to auscultation. Respiratory effort normal.  Midline surgical scar looks clean and nontender. Cardiovascular system: S1 & S2 heard, RRR.   Central nervous system: Alert and oriented. No focal neurological deficits. Extremities: Symmetric 5 x 5 power. Skin: No rashes, lesions or ulcers PICC line looks clean and nontender. No joint tenderness.    Data Reviewed: I have personally reviewed following labs and imaging studies  CBC: Recent Labs  Lab 05/06/19 0438 05/07/19 0500 05/08/19 0452  05/11/19 0500  WBC 6.0 6.6 6.8 5.6  NEUTROABS  --   --   --  2.9  HGB 7.8* 8.0* 8.3* 8.5*  HCT 24.7* 25.8* 26.8* 27.8*  MCV 86.1 87.8 87.3 88.0  PLT 437* 487* 568* 596*  Basic Metabolic Panel: Recent Labs  Lab 05/06/19 0438 05/07/19 0500 05/08/19 0452 05/09/19 0500 05/11/19 0500  NA 138 138 137 139 139  K 3.9 4.0 4.0 4.1 4.1  CL 106 103 103 103 102  CO2 21* 25 26 27 26   GLUCOSE 95 120* 92 94 86  BUN 9 9 7 9 17   CREATININE 0.63 0.71 0.64 0.68 0.61  CALCIUM 8.6* 8.5* 8.7* 8.8* 8.6*  MG  --   --   --   --  1.9  PHOS  --   --   --   --  4.5   GFR: Estimated Creatinine Clearance: 105 mL/min (by C-G formula based on SCr of 0.61 mg/dL). Liver Function Tests: No results for input(s): AST, ALT, ALKPHOS, BILITOT, PROT, ALBUMIN in the last 168 hours. No results for input(s): LIPASE, AMYLASE in the last 168 hours. No results for input(s): AMMONIA in the last 168 hours. Coagulation Profile: No results for input(s): INR, PROTIME in the last 168 hours. Cardiac Enzymes: No results for input(s): CKTOTAL, CKMB, CKMBINDEX, TROPONINI in the last 168 hours. BNP (last 3 results) No results for input(s): PROBNP in the last 8760 hours. HbA1C: No results for input(s): HGBA1C in the last 72 hours. CBG: No results for input(s): GLUCAP in the last 168 hours. Lipid Profile: No results for input(s): CHOL, HDL, LDLCALC, TRIG, CHOLHDL, LDLDIRECT in the last 72 hours. Thyroid Function Tests: No results for input(s): TSH, T4TOTAL, FREET4, T3FREE, THYROIDAB in the last 72 hours. Anemia Panel: No results for input(s): VITAMINB12, FOLATE, FERRITIN, TIBC, IRON, RETICCTPCT in the last 72 hours. Sepsis Labs: No results for input(s): PROCALCITON, LATICACIDVEN in the last 168 hours.  Recent Results (from the past 240 hour(s))  Culture, blood (routine x 2)     Status: None   Collection Time: 05/06/19 12:14 PM   Specimen: BLOOD LEFT HAND  Result Value Ref Range Status   Specimen Description BLOOD LEFT  HAND  Final   Special Requests   Final    BOTTLES DRAWN AEROBIC ONLY Blood Culture results may not be optimal due to an inadequate volume of blood received in culture bottles   Culture   Final    NO GROWTH 5 DAYS Performed at [facility], [place] [address], [place], [place] [address]    Report Status 05/11/2019 FINAL  Final  Culture, blood (routine x 2)     Status: None   Collection Time: 05/06/19 12:32 PM   Specimen: BLOOD LEFT HAND  Result Value Ref Range Status   Specimen Description BLOOD LEFT HAND  Final   Special Requests   Final    BOTTLES DRAWN AEROBIC ONLY Blood Culture results may not be optimal due to an inadequate volume of blood received in culture bottles   Culture   Final    NO GROWTH 5 DAYS Performed at [facility], [place] [address] [place], [place] [address]    Report Status 05/11/2019 FINAL  Final         Radiology Studies: No results found.      Scheduled Meds: . aspirin EC  81 mg Oral Daily   Or  . aspirin  81 mg Per Tube Daily  . bisacodyl  10 mg Oral Daily   Or  . bisacodyl  10 mg Rectal Daily  . busPIRone  7.5 mg Oral BID  . Chlorhexidine Gluconate Cloth  6 each Topical Daily  . clopidogrel  75 mg Oral Daily  . [facility] Patient & Family Education   Does not apply Once  . docusate sodium  200 mg Oral Daily  . enoxaparin (LOVENOX) injection  30 mg Subcutaneous QHS  . escitalopram  10 mg Oral Daily  . feeding supplement (ENSURE ENLIVE)  237 mL Oral BID BM  . ferrous fumarate-b12-vitamic C-folic acid  1 capsule Oral BID PC  . furosemide  40 mg Oral Daily  . metoprolol tartrate  12.5 mg Oral BID   Or  . metoprolol tartrate  12.5 mg Per Tube BID  . pantoprazole  40 mg Oral Daily  . pneumococcal 23 valent vaccine  0.5 mL Intramuscular Tomorrow-1000  . potassium chloride  20 mEq Oral TID  . QUEtiapine  25 mg Oral QHS  . sodium chloride flush  3 mL Intravenous Q12H   Continuous Infusions: . sodium chloride    .  sodium chloride    . cefTRIAXone (ROCEPHIN)  IV 2 g (05/12/19 1049)  . lactated ringers Stopped (04/30/19 2141)  . vancomycin 750 mg (05/12/19 0529)     LOS: 41 days    Time spent: 20 minutes    Dorcas Carrow, MD [facility] Pager [phone]

## 2019-05-13 ENCOUNTER — Inpatient Hospital Stay: Payer: Self-pay

## 2019-05-13 LAB — BASIC METABOLIC PANEL
Anion gap: 7 (ref 5–15)
BUN: 15 mg/dL (ref 6–20)
CO2: 25 mmol/L (ref 22–32)
Calcium: 8.7 mg/dL — ABNORMAL LOW (ref 8.9–10.3)
Chloride: 106 mmol/L (ref 98–111)
Creatinine, Ser: 0.67 mg/dL (ref 0.44–1.00)
GFR calc Af Amer: >60 mL/min (ref >60)
GFR calc non Af Amer: >60 mL/min (ref >60)
Glucose, Bld: 90 mg/dL (ref 70–99)
Potassium: 4.1 mmol/L (ref 3.5–5.1)
Sodium: 138 mmol/L (ref 135–145)

## 2019-05-13 LAB — VANCOMYCIN, TROUGH: Vancomycin Tr: 21 ug/mL (ref 15–20)

## 2019-05-13 NOTE — Progress Notes (Addendum)
      [address]       [place] [address]             [phone]      15 Days Post-Op Procedure(s) (LRB): AORTIC VALVE REPLACEMENT (AVR) using Edwards PERIMOUNT Magna Ease Pericardial Bioprosthesis - 29 MM Aortic Valve. (N/A) REPAIR OF TRICUSPID (N/A) TRANSESOPHAGEAL ECHOCARDIOGRAM (TEE) (N/A) Subjective: Happy this morning that she finally got some sleep.   Objective: Vital signs in last 24 hours: Temp:  [97.7 F (36.5 C)-98.7 F (37.1 C)] 98.3 F (36.8 C) (05/05 0416) Pulse Rate:  [97-103] 103 (05/04 2350) Cardiac Rhythm: Sinus tachycardia (05/05 0300) Resp:  [14-16] 14 (05/05 0416) BP: (90-101)/(57-78) 90/66 (05/05 0416) SpO2:  [99 %-100 %] 100 % (05/05 0416) Weight:  [73.8 kg] 73.8 kg (05/05 0416)     Intake/Output from previous day: 05/04 0701 - 05/05 0700 In: 1399.7 [P.O.:780; IV Piggyback:619.7] Out: -  Intake/Output this shift: No intake/output data recorded.  General appearance: alert, cooperative and no distress Heart: regular rate and rhythm, S1, S2 normal, no murmur, click, rub or gallop Lungs: clear to auscultation bilaterally Abdomen: soft, non-tender; bowel sounds normal; no masses,  no organomegaly Extremities: extremities normal, atraumatic, no cyanosis or edema Wound: clean and dry  Lab Results: Recent Labs    05/11/19 0500  WBC 5.6  HGB 8.5*  HCT 27.8*  PLT 596*   BMET:  Recent Labs    05/11/19 0500 05/13/19 0500  NA 139 138  K 4.1 4.1  CL 102 106  CO2 26 25  GLUCOSE 86 90  BUN 17 15  CREATININE 0.61 0.67  CALCIUM 8.6* 8.7*    PT/INR: No results for input(s): LABPROT, INR in the last 72 hours. ABG    Component Value Date/Time   PHART 7.365 04/28/2019 2205   HCO3 24.5 04/28/2019 2205   TCO2 26 04/28/2019 2205   ACIDBASEDEF 1.0 04/28/2019 2205   O2SAT 97.0 04/28/2019 2205   CBG (last 3)  No results for input(s): GLUCAP in the last 72 hours.  Assessment/Plan: S/P Procedure(s) (LRB): AORTIC VALVE  REPLACEMENT (AVR) using Edwards PERIMOUNT Magna Ease Pericardial Bioprosthesis - 29 MM Aortic Valve. (N/A) REPAIR OF TRICUSPID (N/A) TRANSESOPHAGEAL ECHOCARDIOGRAM (TEE) (N/A)  1. CV-NSR in the 90s, ST st times. BP low this morning-only on metoprolol 12.5mg  BID.  2. Pulm- No issues, on room air 3. Renal-creatinine 0.67, electrolytes ok. Continue lasix 40mg  daily for fluid overload. Remains about 1 kg over her baseline 4. Endo-blood glucose well controlled.  5. ID- continue vancomycin and Rocephin until 5.31. Cultures negative x 5 days  Plan: Continue IV antibiotics, walking 4x a day. Feeling better this morning because she is able to sleep. Continue current care plan.    LOS: 42 days    05/13/2019 Sternal incision intact  Feels well Complete 6 weeks antibiotics postop Will need plan for drug rehab program at d/c this was to be arranged preop I have seen and examined Lauren Reyes and agree with the above assessment  and plan.  09-23-1970 MD Beeper [phone] Office [phone] 05/13/2019 9:16 AM

## 2019-05-13 NOTE — Progress Notes (Signed)
Peripherally Inserted Central Catheter Placement  The IV Nurse has discussed with the patient and/or persons authorized to consent for the patient, the purpose of this procedure and the potential benefits and risks involved with this procedure.  The benefits include less needle sticks, lab draws from the catheter, and the patient may be discharged home with the catheter. Risks include, but not limited to, infection, bleeding, blood clot (thrombus formation), and puncture of an artery; nerve damage and irregular heartbeat and possibility to perform a PICC exchange if needed/ordered by physician.  Alternatives to this procedure were also discussed.  Bard Power PICC patient education guide, fact sheet on infection prevention and patient information card has been provided to patient /or left at bedside.    PICC Placement Documentation  PICC Single Lumen 05/13/19 PICC Right Brachial 35 cm 0 cm (Active)  Indication for Insertion or Continuance of Line Prolonged intravenous therapies 05/13/19 1633  Exposed Catheter (cm) 0 cm 05/13/19 1633  Site Assessment Clean;Dry;Intact 05/13/19 1633  Line Status Flushed;Blood return noted;Saline locked 05/13/19 1633  Dressing Type Transparent 05/13/19 1633  Dressing Status Clean;Dry;Intact;Antimicrobial disc in place 05/13/19 1633  Dressing Change Due 05/20/19 05/13/19 1633       Audrie Gallus 05/13/2019, 4:37 PM

## 2019-05-13 NOTE — Progress Notes (Addendum)
Pharmacy Antibiotic Note Lauren Reyes is a 29 y.o. female with endocarditis involving aortic, mitral, and tricuspid valves with severe aortic regurgitation. Patient's condition is further complicated by septic emboli to her brain, lungs, and spleen. Respiratory culture grew MSSA, however, recent blood cultures are negative. She is s/p AVR/TVRepair on 4/20. Vancomycin doses are being adjusted on troughs alone due to concern of cerebritis.   Vancomycin level drawn 6 hours after dose is 21, true trough is < 20 (trough goal 15-20). Last level of 16 was a true trough (drawn 7.5 hr from dose). Renal function continues to be stable. 5/5 AM dose was held due to level appearing high, ok to give, re-timed in [facility]. Will continue dose for now and order next level in 5 days for closer monitoring.   Plan: - Continue Vancomycin 750mg  Q 8 hr  - Order next vancomycin trough on 5/10 - Continue ceftriaxone IV 2g q12 per ID - Monitor renal function, clinical status - End date for antibiotics: 06/08/19  Height: 5\' 6"  (167.6 cm) Weight: 73.8 kg (162 lb 11.2 oz) IBW/kg (Calculated) : 59.3  Temp (24hrs), Avg:98.2 F (36.8 C), Min:97.7 F (36.5 C), Max:98.4 F (36.9 C)  Recent Labs  Lab 05/06/19 1300 05/07/19 0500 05/08/19 0452 05/09/19 0500 05/11/19 0500 05/13/19 0500  WBC  --  6.6 6.8  --  5.6  --   CREATININE  --  0.71 0.64 0.68 0.61 0.67  VANCOTROUGH 16  --   --   --   --  21*    Estimated Creatinine Clearance: 105.7 mL/min (by C-G formula based on SCr of 0.67 mg/dL).    No Known Allergies  Antimicrobials this admission: Zosyn 3/25 >> 3/25 Cefepime 3/25 >> 3/27 Ancef 3/27 >> 3/30 Vanc 3/25 >> 3/27; restarted 3/30 >> (5/31) Unasyn 3/30>>3/31 Ceftriaxone 3/31>> (5/31) Flucon 4/25 Clotrimazole cream 4/25> 5/1   4/28 VT 16  4/21 VT 16 4/16 VT 17  4/10 VT (drawn 6hr after dose) 16  4/7 VT 23  Microbiology results: 3/24 COVID/Flu >> neg 3/25 RCx >> moderate MSSA 3/25 MRSA PCR >>  negative 3/25 BCx >> negative  4/16 MRSA PCR >> neg 4/20 MRSA PCR >> neg 4/20 Tricuspid valve specimen: neg 4/20 pericardial fluid: neg 4/20 aortic valve leaflets: neg  Thank you for allowing pharmacy to be a part of this patient's care.  5/20, PharmD, BCPS, BCCP Clinical Pharmacist  Please check AMION for all [facility] Pharmacy phone numbers After 10:00 PM, call Main Pharmacy [phone]

## 2019-05-13 NOTE — Progress Notes (Signed)
CXR on 05/08/19 shows PICC tip has been retracted "likely in the right innominate vein near the confluence of the innominate veins."  This is an undesirable position.  If PICC still needed recommend PICC exchange to place the PICC tip into the SVC (centrally).

## 2019-05-13 NOTE — Progress Notes (Signed)
Patient ID: Lauren Reyes, female   DOB: 05/14/1990, 29 y.o.   MRN: [id number]  PROGRESS NOTE    Lauren Reyes  BWG:[id number] DOB: 01/23/1990 DOA: 04/01/2019 PCP: Patient, No Pcp Per   Brief Narrative:  29 year old female with history of substance abuse with heroin, crystal meth and cocaine presented to the ER with aphasia and right-sided weakness and code stroke.  Admitted to neurology ICU.  Patient was intubated and underwent thrombectomy for left MCA M3 occlusion and recanalization.  This was complicated by dissection and pseudoaneurysm of the left ICA status post stent placement.  Extubated on April 04, 2019.  She was found to have right common femoral vein DVT along with right groin hematoma which is treated with IVC filter placement.  TEE showed infective endocarditis.  Status post bioprosthetic aortic valve replacement and tricuspid valve repair on 04/28/2019.  Currently on IV antibiotics till 06/08/2019.  Assessment & Plan:   1. 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 Severe AR. S/P aortic valve replacement with bioprosthetic valve and tricuspid repair Initially presented with code stroke, intubated and admitted to the neuro ICU. Tracheal aspirate grew MSSA. So far tissue culture with no growth to date. Remains on IV vancomycin and IV ceftriaxone. 6 weeks of antibiotics post surgery through Jun 08, 2019. On the path report staining looks like CMV in some areas.  Followed by ID. Patient will remain in a supervised setting for completion of the antibiotics. Repeat cultures 4/28 with no growth.  No fever.   2. Aortic valve endocarditis. SP aortic valve replacement 04/28/2019 and tricuspid repair Cardiothoracic surgery following. Clinically improving.  On room air.   -Continue metoprolol and Lasix  3. Acute CVAof the MCA territory. Embolic in nature. TPA was deferred as concern for septic emboli. S/P left common carotid arteriogram with  complete revascularization of the occluded M3. Complicated by dissection and pseudoaneurysm of the left ICA S/P placement of 4.5 mm x 25 mm pipeline flow diverter stent. Initially was on aspirin and Brilinta. Brilinta was held for cardiothoracic surgery. Patient has been off Ward but on aspirin for cardiothoracic surgery. Patient has intracranial stenting therefore will require dual antiplatelet therapy with aspirin and Plavix per neurology: Continue this for 3 to 6 months Patient will follow up with Dr. Estanislado Pandy and decide on exact regimen. Patient will also require follow-up with neurology.  4. Right common femoral DVT, Right groin hematoma, IVC filter placement April 03, 2019. During the hospitalization patient was found to have right common femoral DVT. Patient also was found to have large right groin hematoma post vascular intervention Decision was made to place IVC filter placement since the patient was felt not a candidate for therapeutic anticoagulation given her septic emboli, dissection as well as hematoma.  5. Acute blood loss anemia. Iron deficiency anemia. B12 deficiency. Folate deficiency. Continue supplementation. H&H relatively stable. No active bleeding identified. No GI bleed.  6. Polysubstance abuse. UDS positive for THC, amphetamine admission. Suspected IV drug use as well with needle marks. Currently on oral pain medication. Out of the window for any withdrawals.  7. Anxiety. Currently on BuSpar, Lexapro, Seroquel. May not require Seroquel at discharge. As needed Ativan.  8. Hepatitis C antibody positive. Negative viral load. HIV screen negative. RPR negative. Will require outpatient follow-up and therapy.  9. Pain control. On as needed oxycodone.  Suggested patient to use Tylenol and decrease dose of oxycodone. Tapering off now.    Subjective: Patient seen and examined at bedside.  She denies  worsening fever, nausea, vomiting or  shortness of breath.  Objective: Vitals:   05/12/19 2044 05/12/19 2350 05/13/19 0416 05/13/19 0819  BP: 92/62 94/63 90/66  102/72  Pulse: 97 (!) 103  96  Resp: 16 16 14 18   Temp: 98.4 F (36.9 C) 98.1 F (36.7 C) 98.3 F (36.8 C) 98.4 F (36.9 C)  TempSrc: Oral Oral Oral Oral  SpO2: 100% 99% 100% 100%  Weight:   73.8 kg   Height:        Intake/Output Summary (Last 24 hours) at 05/13/2019 1156 Last data filed at 05/12/2019 2300 Gross per 24 hour  Intake 1159.73 ml  Output --  Net 1159.73 ml   Filed Weights   05/11/19 0500 05/12/19 0449 05/13/19 0416  Weight: 72.1 kg 72.9 kg 73.8 kg    Examination:  General exam: Appears calm and comfortable  Respiratory system: Bilateral decreased breath sounds at bases Cardiovascular system: S1 & S2 heard, intermittently tachycardic gastrointestinal system: Abdomen is nondistended, soft and nontender. Normal bowel sounds heard. Extremities: No cyanosis, clubbing, edema    Data Reviewed: I have personally reviewed following labs and imaging studies  CBC: Recent Labs  Lab 05/07/19 0500 05/08/19 0452 05/11/19 0500  WBC 6.6 6.8 5.6  NEUTROABS  --   --  2.9  HGB 8.0* 8.3* 8.5*  HCT 25.8* 26.8* 27.8*  MCV 87.8 87.3 88.0  PLT 487* 568* 596*   Basic Metabolic Panel: Recent Labs  Lab 05/07/19 0500 05/08/19 0452 05/09/19 0500 05/11/19 0500 05/13/19 0500  NA 138 137 139 139 138  K 4.0 4.0 4.1 4.1 4.1  CL 103 103 103 102 106  CO2 25 26 27 26 25   GLUCOSE 120* 92 94 86 90  BUN 9 7 9 17 15   CREATININE 0.71 0.64 0.68 0.61 0.67  CALCIUM 8.5* 8.7* 8.8* 8.6* 8.7*  MG  --   --   --  1.9  --   PHOS  --   --   --  4.5  --    GFR: Estimated Creatinine Clearance: 105.7 mL/min (by C-G formula based on SCr of 0.67 mg/dL). Liver Function Tests: No results for input(s): AST, ALT, ALKPHOS, BILITOT, PROT, ALBUMIN in the last 168 hours. No results for input(s): LIPASE, AMYLASE in the last 168 hours. No results for input(s): AMMONIA in the  last 168 hours. Coagulation Profile: No results for input(s): INR, PROTIME in the last 168 hours. Cardiac Enzymes: No results for input(s): CKTOTAL, CKMB, CKMBINDEX, TROPONINI in the last 168 hours. BNP (last 3 results) No results for input(s): PROBNP in the last 8760 hours. HbA1C: No results for input(s): HGBA1C in the last 72 hours. CBG: No results for input(s): GLUCAP in the last 168 hours. Lipid Profile: No results for input(s): CHOL, HDL, LDLCALC, TRIG, CHOLHDL, LDLDIRECT in the last 72 hours. Thyroid Function Tests: No results for input(s): TSH, T4TOTAL, FREET4, T3FREE, THYROIDAB in the last 72 hours. Anemia Panel: No results for input(s): VITAMINB12, FOLATE, FERRITIN, TIBC, IRON, RETICCTPCT in the last 72 hours. Sepsis Labs: No results for input(s): PROCALCITON, LATICACIDVEN in the last 168 hours.  Recent Results (from the past 240 hour(s))  Culture, blood (routine x 2)     Status: None   Collection Time: 05/06/19 12:14 PM   Specimen: BLOOD LEFT HAND  Result Value Ref Range Status   Specimen Description BLOOD LEFT HAND  Final   Special Requests   Final    BOTTLES DRAWN AEROBIC ONLY Blood Culture results may not be optimal  due to an inadequate volume of blood received in culture bottles   Culture   Final    NO GROWTH 5 DAYS Performed at [facility], [address], [place], [place] [address]    Report Status 05/11/2019 FINAL  Final  Culture, blood (routine x 2)     Status: None   Collection Time: 05/06/19 12:32 PM   Specimen: BLOOD LEFT HAND  Result Value Ref Range Status   Specimen Description BLOOD LEFT HAND  Final   Special Requests   Final    BOTTLES DRAWN AEROBIC ONLY Blood Culture results may not be optimal due to an inadequate volume of blood received in culture bottles   Culture   Final    NO GROWTH 5 DAYS Performed at [facility], [address] [place], [place] [address]    Report Status 05/11/2019 FINAL  Final         Radiology  Studies: No results found.      Scheduled Meds: . aspirin EC  81 mg Oral Daily   Or  . aspirin  81 mg Per Tube Daily  . bisacodyl  10 mg Oral Daily   Or  . bisacodyl  10 mg Rectal Daily  . busPIRone  7.5 mg Oral BID  . Chlorhexidine Gluconate Cloth  6 each Topical Daily  . clopidogrel  75 mg Oral Daily  . [place] [facility] Patient [facility] Family Education   Does not apply Once  . docusate sodium  200 mg Oral Daily  . escitalopram  10 mg Oral Daily  . feeding supplement (ENSURE ENLIVE)  237 mL Oral BID BM  . ferrous fumarate-b12-vitamic C-folic acid  1 capsule Oral BID PC  . furosemide  40 mg Oral Daily  . metoprolol tartrate  12.5 mg Oral BID   Or  . metoprolol tartrate  12.5 mg Per Tube BID  . pantoprazole  40 mg Oral Daily  . pneumococcal 23 valent vaccine  0.5 mL Intramuscular Tomorrow-1000  . potassium chloride  20 mEq Oral TID  . QUEtiapine  25 mg Oral QHS  . sodium chloride flush  3 mL Intravenous Q12H   Continuous Infusions: . sodium chloride    . sodium chloride    . cefTRIAXone (ROCEPHIN)  IV 2 g (05/13/19 1105)  . lactated ringers Stopped (04/30/19 2141)  . vancomycin 750 mg (05/13/19 0856)          Glade Lloyd, MD [facility] 05/13/2019, 11:56 AM

## 2019-05-13 NOTE — Progress Notes (Signed)
CARDIAC REHAB PHASE I   PRE:  Rate/Rhythm: 86 SR  BP:  Supine: 91/62  Sitting:   Standing:    SaO2: 98%RA  MODE:  Ambulation: 790 ft   POST:  Rate/Rhythm: 108 ST  BP:  Supine:   Sitting: 94/72  Standing:    SaO2: 99%RA 1058-1132 Pt walked 790 ft on RA with steady gait and tolerated well. Appeared slightly SOB but sats good on RA.   Luetta Nutting, RN BSN  05/13/2019 11:28 AM

## 2019-05-13 NOTE — Progress Notes (Signed)
Nutrition Follow-up  DOCUMENTATION CODES:   Not applicable  INTERVENTION:   -Continue Ensure Enlive po BID, each supplement provides 350 kcal and 20 grams of protein -Continue MVI with minerals daily  NUTRITION DIAGNOSIS:   Increased nutrient needs related to acute illness as evidenced by estimated needs.  Ongoing  GOAL:   Patient will meet greater than or equal to 90% of their needs  Progressing   MONITOR:   I & O's, Labs, Supplement acceptance, PO intake, Weight trends  REASON FOR ASSESSMENT:   Ventilator    ASSESSMENT:   Pt with suspected IV drug abuse who while being interviewed by police developed left-sided weakness admitted with L MCA s/p IR for revascularization and L ICA stent.  3/24 - OG tube placed, intubated 3/27 - OG removed, extubated 3/29 - Regular diet ordered 3/30 - s/p TEE 4/20 - endocaditis with AV replacement, tricuspid valve repair, TEE  Pt endorses stable appetite. Meal completions charted as 50-75% for her last four meals. Drinking Ensure 1-2 times daily. Does not wish to have other supplements or snacks. PO intake encouraged.   Admission weight: 72.3 kg  Current weight: 73.8 kg   Medications: dulcoloax, colace, 40 mg lasix daily, 20 mEq KCl TID Labs: reviewed   Diet Order:   Diet Order            Diet 2 gram sodium Room service appropriate? Yes; Fluid consistency: Thin  Diet effective now              EDUCATION NEEDS:   No education needs have been identified at this time  Skin:  Skin Assessment: Skin Integrity Issues: Skin Integrity Issues:: Other (Comment), Incisions Incisions: chest Other: open wound  Last BM:  5/3  Height:   Ht Readings from Last 1 Encounters:  05/01/19 5\' 6"  (1.676 m)    Weight:   Wt Readings from Last 1 Encounters:  05/13/19 73.8 kg    Ideal Body Weight:  56.8 kg  BMI:  Body mass index is 26.26 kg/m.  Estimated Nutritional Needs:   Kcal:  1900-2100  Protein:  100-115  grams  Fluid:  > 1.9 L/day   07/13/19 RD, LDN Clinical Nutrition Pager listed in AMION

## 2019-05-14 MED ORDER — METOPROLOL TARTRATE 25 MG/10 ML ORAL SUSPENSION
6.2500 mg | Freq: Two times a day (BID) | ORAL | Status: DC
  Administered 2019-05-15 – 2019-06-08 (×28): 6.25 mg via ORAL
  Filled 2019-05-14 (×54): qty 5

## 2019-05-14 MED ORDER — FUROSEMIDE 20 MG PO TABS
20.0000 mg | ORAL_TABLET | Freq: Every day | ORAL | Status: DC
  Administered 2019-05-14 – 2019-06-09 (×27): 20 mg via ORAL
  Filled 2019-05-14 (×27): qty 1

## 2019-05-14 NOTE — Progress Notes (Signed)
CARDIAC REHAB PHASE I   PRE:  Rate/Rhythm: 91 SR    BP: sitting 97/68    SaO2:   MODE:  Ambulation: 1580 ft   POST:  Rate/Rhythm: 113 ST    BP: sitting 104/75     SaO2: 100 RA  Tolerated well. Increased distance, HR lower. Feels good.  [id number]   Harriet Masson CES, ACSM 05/14/2019 9:28 AM

## 2019-05-14 NOTE — Plan of Care (Signed)
  Problem: Health Behavior/Discharge Planning: Goal: Ability to manage health-related needs will improve Outcome: Progressing   

## 2019-05-14 NOTE — Progress Notes (Addendum)
Patient ID: Analyah Leonard Reyes, female   DOB: 1990-05-01, 29 y.o.   MRN: [id number]  PROGRESS NOTE    Lauren Reyes  XIP:[id number] DOB: 1990-12-28 DOA: 04/01/2019 PCP: Patient, No Pcp Per   Brief Narrative:  29 year old female with history of substance abuse with heroin, crystal meth and cocaine presented to the ER with aphasia and right-sided weakness and code stroke.  Admitted to neurology ICU.  Patient was intubated and underwent thrombectomy for left MCA M3 occlusion and recanalization.  This was complicated by dissection and pseudoaneurysm of the left ICA status post stent placement.  Extubated on April 04, 2019.  She was found to have right common femoral vein DVT along with right groin hematoma which is treated with IVC filter placement.  TEE showed infective endocarditis.  Status post bioprosthetic aortic valve replacement and tricuspid valve repair on 04/28/2019.  Currently on IV antibiotics till 06/08/2019.  Assessment & Plan:   1. 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 Severe AR. S/P aortic valve replacement with bioprosthetic valve and tricuspid repair Initially presented with code stroke, intubated and admitted to the neuro ICU. Tracheal aspirate grew MSSA. So far tissue culture with no growth to date. Remains on IV vancomycin and IV ceftriaxone. 6 weeks of antibiotics post surgery through Jun 08, 2019. On the path report staining looks like CMV in some areas.  Followed by ID. Patient will remain in a supervised setting for completion of the antibiotics. Repeat cultures 4/28 with no growth.  No fever. -CT surgery requested that patient be taken over by [facility] service.  Patient was  transferred to [facility] service on 05/14/2019. -Currently afebrile and hemodynamic stable.   2. Aortic valve endocarditis. SP aortic valve replacement 04/28/2019 and tricuspid repair Cardiothoracic surgery following. Clinically improving.  On room air.   -Continue metoprolol  and Lasix  3. Acute CVAof the MCA territory. Embolic in nature. TPA was deferred as concern for septic emboli. S/P left common carotid arteriogram with complete revascularization of the occluded M3. Complicated by dissection and pseudoaneurysm of the left ICA S/P placement of 4.5 mm x 25 mm pipeline flow diverter stent. Initially was on aspirin and Brilinta. Brilinta was held for cardiothoracic surgery. Patient has been off Brilinta but on aspirin for cardiothoracic surgery. Patient has intracranial stenting therefore will require dual antiplatelet therapy with aspirin and Plavix per neurology: Continue this for 3 to 6 months Patient will follow up with Dr. Corliss Skains and decide on exact regimen. Patient will also require follow-up with neurology.  4. Right common femoral DVT, Right groin hematoma, IVC filter placement April 03, 2019. During the hospitalization patient was found to have right common femoral DVT. Patient also was found to have large right groin hematoma post vascular intervention Decision was made to place IVC filter placement since the patient was felt not a candidate for therapeutic anticoagulation given her septic emboli, dissection as well as hematoma.  5. Acute blood loss anemia. Iron deficiency anemia. B12 deficiency. Folate deficiency. Continue supplementation. H&H relatively stable. No active bleeding identified. No GI bleed.  6. Polysubstance abuse. UDS positive for THC, amphetamine admission. Suspected IV drug use as well with needle marks. Currently on oral pain medication. Out of the window for any withdrawals.  7. Anxiety. Currently on BuSpar, Lexapro, Seroquel. May not require Seroquel at discharge. As needed Ativan.  8. Hepatitis C antibody positive. Negative viral load. HIV screen negative. RPR negative. Will require outpatient follow-up and therapy.  9. Pain control. On as needed oxycodone.  Suggested patient to use Tylenol and  decrease dose of oxycodone. Tapering off now.   DVT prophylaxis: Heparin subcu  Code Status: Full code  Family Communication: None  Disposition Plan:  Status is: Inpatient  Remains inpatient appropriate because patient will need to be inpatient to complete IV antibiotic treatment.  Dispo: The patient is from: Home  Anticipated d/c is to: Home  Anticipated d/c date is: > 3 days  Patient currently is not medically stable to d/c.   Consultants:  CT surgery  Infectious disease  Procedures:  Aortic valve replacement and tricuspid valve repair on 4/20   Anti-infectives (From admission, onward)   Start     Dose/Rate Route Frequency Ordered Stop   05/03/19 1000  fluconazole (DIFLUCAN) tablet 150 mg     150 mg Oral  Once 05/03/19 0804 05/03/19 1243   04/28/19 0400  vancomycin (VANCOREADY) IVPB 1250 mg/250 mL  Status:  Discontinued     1,250 mg 166.7 mL/hr over 90 Minutes Intravenous To Surgery 04/27/19 1131 04/28/19 1451   04/28/19 0400  cefUROXime (ZINACEF) 1.5 g in sodium chloride 0.9 % 100 mL IVPB     1.5 g 200 mL/hr over 30 Minutes Intravenous To Surgery 04/27/19 1131 04/28/19 0819   04/28/19 0400  cefUROXime (ZINACEF) 750 mg in sodium chloride 0.9 % 100 mL IVPB     750 mg 200 mL/hr over 30 Minutes Intravenous To Surgery 04/27/19 1131 04/28/19 1313   04/15/19 0900  vancomycin (VANCOREADY) IVPB 750 mg/150 mL     750 mg 150 mL/hr over 60 Minutes Intravenous Every 8 hours 04/15/19 0821     04/13/19 1400  vancomycin (VANCOCIN) IVPB 1000 mg/200 mL premix  Status:  Discontinued     1,000 mg 200 mL/hr over 60 Minutes Intravenous Every 8 hours 04/13/19 0839 04/15/19 0821   04/12/19 2200  vancomycin (VANCOREADY) IVPB 750 mg/150 mL  Status:  Discontinued     750 mg 150 mL/hr over 60 Minutes Intravenous Every 8 hours 04/12/19 1500 04/13/19 0839   04/10/19 1336  vancomycin (VANCOCIN) IVPB 1000 mg/200 mL premix  Status:  Discontinued     1,000 mg 200 mL/hr over 60 Minutes Intravenous  Every 8 hours 04/10/19 1320 04/12/19 1500   04/08/19 1200  cefTRIAXone (ROCEPHIN) 2 g in sodium chloride 0.9 % 100 mL IVPB     2 g 200 mL/hr over 30 Minutes Intravenous Every 12 hours 04/08/19 1013 06/09/19 2359   04/08/19 0530  vancomycin (VANCOCIN) IVPB 1000 mg/200 mL premix  Status:  Discontinued     1,000 mg 200 mL/hr over 60 Minutes Intravenous Every 12 hours 04/07/19 1541 04/10/19 1320   04/07/19 1900  Ampicillin-Sulbactam (UNASYN) 3 g in sodium chloride 0.9 % 100 mL IVPB  Status:  Discontinued     3 g 200 mL/hr over 30 Minutes Intravenous Every 6 hours 04/07/19 1457 04/08/19 1013   04/07/19 1730  vancomycin (VANCOREADY) IVPB 1500 mg/300 mL     1,500 mg 150 mL/hr over 120 Minutes Intravenous  Once 04/07/19 1541 04/07/19 1937   04/04/19 1400  ceFAZolin (ANCEF) IVPB 2g/100 mL premix  Status:  Discontinued     2 g 200 mL/hr over 30 Minutes Intravenous Every 8 hours 04/04/19 1119 04/07/19 1457   04/04/19 0400  Vancomycin (VANCOCIN) 1,250 mg in sodium chloride 0.9 % 250 mL IVPB  Status:  Discontinued     1,250 mg 166.7 mL/hr over 90 Minutes Intravenous Every 12 hours 04/03/19 1635 04/04/19 1159   04/02/19 1800  ceFEPIme (MAXIPIME) 2 g in sodium chloride 0.9 % 100 mL IVPB  Status:  Discontinued     2 g 200 mL/hr over 30 Minutes Intravenous Every 8 hours 04/02/19 1218 04/04/19 1127   04/02/19 1600  vancomycin (VANCOCIN) IVPB 1000 mg/200 mL premix  Status:  Discontinued     1,000 mg 200 mL/hr over 60 Minutes Intravenous Every 12 hours 04/02/19 0100 04/03/19 1635   04/02/19 0200  piperacillin-tazobactam (ZOSYN) IVPB 3.375 g  Status:  Discontinued     3.375 g 12.5 mL/hr over 240 Minutes Intravenous Every 8 hours 04/02/19 0059 04/02/19 1219   04/02/19 0200  vancomycin (VANCOCIN) 1,500 mg in sodium chloride 0.9 % 500 mL IVPB     1,500 mg 250 mL/hr over 120 Minutes Intravenous  Once 04/02/19 0100 04/02/19 0923   04/01/19 2040  ceFAZolin (ANCEF) 2-4 GM/100ML-% IVPB    Note to Pharmacy:  Channing Mutters   : cabinet override      04/01/19 2040 04/02/19 0844        Subjective: Patient seen and examined at bedside.  No new complaints.  Denies worsening shortness of breath or chest pain. Objective: Vitals:   05/13/19 1726 05/13/19 2023 05/13/19 2330 05/14/19 0638  BP: 104/67 115/82 95/64 106/72  Pulse: (!) 101 (!) 109 100 84  Resp: 18 20 19 20   Temp: 97.9 F (36.6 C) 97.7 F (36.5 C) 98 F (36.7 C) 97.9 F (36.6 C)  TempSrc: Oral Oral Oral Oral  SpO2: 100% 100% 100% 97%  Weight:    74.3 kg  Height:        Intake/Output Summary (Last 24 hours) at 05/14/2019 0738 Last data filed at 05/14/2019 0100 Gross per 24 hour  Intake 750 ml  Output --  Net 750 ml   Filed Weights   05/12/19 0449 05/13/19 0416 05/14/19 07/14/19  Weight: 72.9 kg 73.8 kg 74.3 kg    Examination:  General exam: No distress. Respiratory system: Bilateral decreased breath sounds at bases with some scattered crackles Cardiovascular system: S1 & S2 heard, currently rate controlled gastrointestinal system: Abdomen is nondistended, soft and nontender.  Bowel sounds are heard Extremities: No edema or clubbing   Data Reviewed: I have personally reviewed following labs and imaging studies  CBC: Recent Labs  Lab 05/08/19 0452 05/11/19 0500  WBC 6.8 5.6  NEUTROABS  --  2.9  HGB 8.3* 8.5*  HCT 26.8* 27.8*  MCV 87.3 88.0  PLT 568* 596*   Basic Metabolic Panel: Recent Labs  Lab 05/08/19 0452 05/09/19 0500 05/11/19 0500 05/13/19 0500  NA 137 139 139 138  K 4.0 4.1 4.1 4.1  CL 103 103 102 106  CO2 26 27 26 25   GLUCOSE 92 94 86 90  BUN 7 9 17 15   CREATININE 0.64 0.68 0.61 0.67  CALCIUM 8.7* 8.8* 8.6* 8.7*  MG  --   --  1.9  --   PHOS  --   --  4.5  --    GFR: Estimated Creatinine Clearance: 106 mL/min (by C-G formula based on SCr of 0.67 mg/dL). Liver Function Tests: No results for input(s): AST, ALT, ALKPHOS, BILITOT, PROT, ALBUMIN in the last 168 hours. No results for input(s):  LIPASE, AMYLASE in the last 168 hours. No results for input(s): AMMONIA in the last 168 hours. Coagulation Profile: No results for input(s): INR, PROTIME in the last 168 hours. Cardiac Enzymes: No results for input(s): CKTOTAL, CKMB, CKMBINDEX, TROPONINI in the last 168 hours. BNP (last 3 results)  No results for input(s): PROBNP in the last 8760 hours. HbA1C: No results for input(s): HGBA1C in the last 72 hours. CBG: No results for input(s): GLUCAP in the last 168 hours. Lipid Profile: No results for input(s): CHOL, HDL, LDLCALC, TRIG, CHOLHDL, LDLDIRECT in the last 72 hours. Thyroid Function Tests: No results for input(s): TSH, T4TOTAL, FREET4, T3FREE, THYROIDAB in the last 72 hours. Anemia Panel: No results for input(s): VITAMINB12, FOLATE, FERRITIN, TIBC, IRON, RETICCTPCT in the last 72 hours. Sepsis Labs: No results for input(s): PROCALCITON, LATICACIDVEN in the last 168 hours.  Recent Results (from the past 240 hour(s))  Culture, blood (routine x 2)     Status: None   Collection Time: 05/06/19 12:14 PM   Specimen: BLOOD LEFT HAND  Result Value Ref Range Status   Specimen Description BLOOD LEFT HAND  Final   Special Requests   Final    BOTTLES DRAWN AEROBIC ONLY Blood Culture results may not be optimal due to an inadequate volume of blood received in culture bottles   Culture   Final    NO GROWTH 5 DAYS Performed at [facility], [place] [address], [place], [place] [address]    Report Status 05/11/2019 FINAL  Final  Culture, blood (routine x 2)     Status: None   Collection Time: 05/06/19 12:32 PM   Specimen: BLOOD LEFT HAND  Result Value Ref Range Status   Specimen Description BLOOD LEFT HAND  Final   Special Requests   Final    BOTTLES DRAWN AEROBIC ONLY Blood Culture results may not be optimal due to an inadequate volume of blood received in culture bottles   Culture   Final    NO GROWTH 5 DAYS Performed at [facility], [place] [address] [place], [place]  [address]    Report Status 05/11/2019 FINAL  Final         Radiology Studies: [place] EKG SITE RITE  Result Date: 05/13/2019 If Site Rite image not attached, placement could not be confirmed due to current cardiac rhythm.       Scheduled Meds: . aspirin EC  81 mg Oral Daily   Or  . aspirin  81 mg Per Tube Daily  . bisacodyl  10 mg Oral Daily   Or  . bisacodyl  10 mg Rectal Daily  . busPIRone  7.5 mg Oral BID  . Chlorhexidine Gluconate Cloth  6 each Topical Daily  . clopidogrel  75 mg Oral Daily  . [facility], Patient & Family Education   Does not apply Once  . docusate sodium  200 mg Oral Daily  . escitalopram  10 mg Oral Daily  . feeding supplement (ENSURE ENLIVE)  237 mL Oral BID BM  . ferrous [id number] C-folic acid  1 capsule Oral BID PC  . furosemide  40 mg Oral Daily  . metoprolol tartrate  12.5 mg Oral BID   Or  . metoprolol tartrate  12.5 mg Per Tube BID  . pantoprazole  40 mg Oral Daily  . pneumococcal 23 valent vaccine  0.5 mL Intramuscular Tomorrow-1000  . potassium chloride  20 mEq Oral TID  . QUEtiapine  25 mg Oral QHS  . sodium chloride flush  3 mL Intravenous Q12H   Continuous Infusions: . sodium chloride    . sodium chloride    . cefTRIAXone (ROCEPHIN)  IV 2 g (05/13/19 2154)  . lactated ringers Stopped (04/30/19 2141)  . vancomycin 750 mg (05/14/19 0133)  Glade Lloyd, MD [facility] 05/14/2019, 7:38 AM

## 2019-05-14 NOTE — Progress Notes (Addendum)
      [address]       [place] [address]             [phone]      16 Days Post-Op Procedure(s) (LRB): AORTIC VALVE REPLACEMENT (AVR) using [place] PERIMOUNT Magna Ease Pericardial Bioprosthesis - 29 MM Aortic Valve. (N/A) REPAIR OF TRICUSPID (N/A) TRANSESOPHAGEAL ECHOCARDIOGRAM (TEE) (N/A) Subjective: Feels good this morning. No issues over night  Objective: Vital signs in last 24 hours: Temp:  [97.7 F (36.5 C)-98.4 F (36.9 C)] 97.9 F (36.6 C) (05/06 0638) Pulse Rate:  [84-109] 84 (05/06 0638) Cardiac Rhythm: Sinus tachycardia (05/05 2200) Resp:  [18-20] 20 (05/06 0638) BP: (93-115)/(64-82) 106/72 (05/06 0638) SpO2:  [97 %-100 %] 97 % (05/06 0175) Weight:  [74.3 kg] 74.3 kg (05/06 0638)     Intake/Output from previous day: 05/05 0701 - 05/06 0700 In: 750 [IV Piggyback:750] Out: -  Intake/Output this shift: No intake/output data recorded.  General appearance: alert, cooperative and no distress Heart: regular rate and rhythm, S1, S2 normal, no murmur, click, rub or gallop Lungs: clear to auscultation bilaterally Abdomen: soft, non-tender; bowel sounds normal; no masses,  no organomegaly Extremities: extremities normal, atraumatic, no cyanosis or edema Wound: clean and dry  Lab Results: No results for input(s): WBC, HGB, HCT, PLT in the last 72 hours. BMET:  Recent Labs    05/13/19 0500  NA 138  K 4.1  CL 106  CO2 25  GLUCOSE 90  BUN 15  CREATININE 0.67  CALCIUM 8.7*    PT/INR: No results for input(s): LABPROT, INR in the last 72 hours. ABG    Component Value Date/Time   PHART 7.365 04/28/2019 2205   HCO3 24.5 04/28/2019 2205   TCO2 26 04/28/2019 2205   ACIDBASEDEF 1.0 04/28/2019 2205   O2SAT 97.0 04/28/2019 2205   CBG (last 3)  No results for input(s): GLUCAP in the last 72 hours.  Assessment/Plan: S/P Procedure(s) (LRB): AORTIC VALVE REPLACEMENT (AVR) using [employer] PERIMOUNT Magna Ease Pericardial Bioprosthesis - 29  MM Aortic Valve. (N/A) REPAIR OF TRICUSPID (N/A) TRANSESOPHAGEAL ECHOCARDIOGRAM (TEE) (N/A)  1. CV-NSR in the 90s, ST st times. BP low this morning-only on metoprolol 12.5mg  BID.  2. Pulm- No issues, on room air 3. Renal-creatinine 0.67, electrolytes ok. Continue lasix 40mg  daily for fluid overload. Remains about 1 kg over her baseline 4. Endo-blood glucose well controlled.  5. H and H- 8.5/27.8, expected acute blood loss anemia 6. ID- continue vancomycin and Rocephin until 5.31. Cultures negative x 5 days  Plan: Completing 6 weeks of IV antibiotics. Continue medical care.     LOS: 43 days    04-15-2004 05/14/2019  pic line changed yesterday Will reduce dose of lopressor  Continue diuresis but decreased dose Now stable from surgical point of view , would be home except for in hospital need for iv antibiotics  Request transfer to medical service  Will need discharge plan - drug rehab treatment arranged - see preop notes  No primary care md of record- will need to be arranged for medical follow up after d/c  Will need ID and cardiology follow up at d/c  I have seen and examined Ireene Wanjiku Wisenbaker and agree with the above assessment  and plan.  09-23-1970 MD Beeper [phone] Office [phone] 05/14/2019 8:05 AM

## 2019-05-15 LAB — GLUCOSE, CAPILLARY: Glucose-Capillary: 92 mg/dL (ref 70–99)

## 2019-05-15 NOTE — Progress Notes (Signed)
[address]       [place] [address]             [phone]      17 Days Post-Op Procedure(s) (LRB): AORTIC VALVE REPLACEMENT (AVR) using [employer] PERIMOUNT Magna Ease Pericardial Bioprosthesis - 29 MM Aortic Valve. (N/A) REPAIR OF TRICUSPID (N/A) TRANSESOPHAGEAL ECHOCARDIOGRAM (TEE) (N/A) Subjective: conts to feel well, no new c/o  Objective: Vital signs in last 24 hours: Temp:  [98.5 F (36.9 C)-99 F (37.2 C)] 98.5 F (36.9 C) (05/07 0826) Pulse Rate:  [66-100] 94 (05/07 0826) Cardiac Rhythm: Normal sinus rhythm (05/07 0814) Resp:  [17-20] 18 (05/07 0826) BP: (89-109)/(58-74) 109/73 (05/07 0826) SpO2:  [99 %-100 %] 100 % (05/07 0826) Weight:  [74.9 kg] 74.9 kg (05/07 0500)  Hemodynamic parameters for last 24 hours:    Intake/Output from previous day: 05/06 0701 - 05/07 0700 In: 200 [IV Piggyback:200] Out: -  Intake/Output this shift: No intake/output data recorded.  General appearance: alert, cooperative and no distress Heart: regular rate and rhythm and soft systolic murmur Lungs: dim in bases Abdomen: benign Extremities: no edema Wound: incis healing well  Lab Results: No results for input(s): WBC, HGB, HCT, PLT in the last 72 hours. BMET:  Recent Labs    05/13/19 0500  NA 138  K 4.1  CL 106  CO2 25  GLUCOSE 90  BUN 15  CREATININE 0.67  CALCIUM 8.7*    PT/INR: No results for input(s): LABPROT, INR in the last 72 hours. ABG    Component Value Date/Time   PHART 7.365 04/28/2019 2205   HCO3 24.5 04/28/2019 2205   TCO2 26 04/28/2019 2205   ACIDBASEDEF 1.0 04/28/2019 2205   O2SAT 97.0 04/28/2019 2205   CBG (last 3)  No results for input(s): GLUCAP in the last 72 hours.  Meds Scheduled Meds: . aspirin EC  81 mg Oral Daily   Or  . aspirin  81 mg Per Tube Daily  . bisacodyl  10 mg Oral Daily   Or  . bisacodyl  10 mg Rectal Daily  . busPIRone  7.5 mg Oral BID  . Chlorhexidine Gluconate Cloth  6 each Topical Daily    . clopidogrel  75 mg Oral Daily  . [facility], Patient & Family Education   Does not apply Once  . docusate sodium  200 mg Oral Daily  . escitalopram  10 mg Oral Daily  . feeding supplement (ENSURE ENLIVE)  237 mL Oral BID BM  . ferrous fumarate-b12-vitamic C-folic acid  1 capsule Oral BID PC  . furosemide  20 mg Oral Daily  . metoprolol tartrate  6.25 mg Oral BID  . pantoprazole  40 mg Oral Daily  . pneumococcal 23 valent vaccine  0.5 mL Intramuscular Tomorrow-1000  . potassium chloride  20 mEq Oral TID  . QUEtiapine  25 mg Oral QHS  . sodium chloride flush  3 mL Intravenous Q12H   Continuous Infusions: . sodium chloride    . sodium chloride    . cefTRIAXone (ROCEPHIN)  IV 2 g (05/14/19 2150)  . lactated ringers Stopped (04/30/19 2141)  . vancomycin 750 mg (05/15/19 0955)   PRN Meds:.sodium chloride, acetaminophen, diphenhydrAMINE, ondansetron (ZOFRAN) IV, oxyCODONE, polyethylene glycol, sodium chloride flush  Xrays [place] EKG SITE RITE  Result Date: 05/13/2019 If Site Rite image not attached, placement could not be confirmed due to current cardiac rhythm.   Assessment/Plan: S/P Procedure(s) (LRB): AORTIC VALVE REPLACEMENT (AVR)  using [facility] Ease Pericardial Bioprosthesis - 29 MM Aortic Valve. (N/A) REPAIR OF TRICUSPID (N/A) TRANSESOPHAGEAL ECHOCARDIOGRAM (TEE) (N/A)  1 remains stable from surgical perspective 2 BP a little soft but stable in 90-100's 3 sats good on RA 4 sinus rhythm 5 no new labs or CXR's 6 conts current ABX plan 7 lasix dose reduced yesterday 8 medicine assisting with managemant  LOS: 44 days    [name] PA-C Pager [phone] 05/15/2019

## 2019-05-15 NOTE — Plan of Care (Signed)
  Problem: Health Behavior/Discharge Planning: Goal: Ability to manage health-related needs will improve Outcome: Progressing   Problem: Clinical Measurements: Goal: Respiratory complications will improve Outcome: Progressing Goal: Cardiovascular complication will be avoided Outcome: Progressing   

## 2019-05-15 NOTE — Progress Notes (Signed)
1000 Observed pt up walking independently with steady gait. Encouragement given . Pt very motivated.Luetta Nutting RN BSN 05/15/2019 10:55 AM

## 2019-05-15 NOTE — Progress Notes (Addendum)
Patient ID: Lauren Reyes, female   DOB: 1990-05-01, 29 y.o.   MRN: [id number]  PROGRESS NOTE    [name]  XIP:[id number] DOB: 1990-12-28 DOA: 04/01/2019 PCP: Patient, No Pcp Per   Brief Narrative:  29 year old female with history of substance abuse with heroin, crystal meth and cocaine presented to the ER with aphasia and right-sided weakness and code stroke.  Admitted to neurology ICU.  Patient was intubated and underwent thrombectomy for left MCA M3 occlusion and recanalization.  This was complicated by dissection and pseudoaneurysm of the left ICA status post stent placement.  Extubated on April 04, 2019.  She was found to have right common femoral vein DVT along with right groin hematoma which is treated with IVC filter placement.  TEE showed infective endocarditis.  Status post bioprosthetic aortic valve replacement and tricuspid valve repair on 04/28/2019.  Currently on IV antibiotics till 06/08/2019.  Assessment & Plan:   1. 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 Severe AR. S/P aortic valve replacement with bioprosthetic valve and tricuspid repair Initially presented with code stroke, intubated and admitted to the neuro ICU. Tracheal aspirate grew MSSA. So far tissue culture with no growth to date. Remains on IV vancomycin and IV ceftriaxone. 6 weeks of antibiotics post surgery through Jun 08, 2019. On the path report staining looks like CMV in some areas.  Followed by ID. Patient will remain in a supervised setting for completion of the antibiotics. Repeat cultures 4/28 with no growth.  No fever. -CT surgery requested that patient be taken over by [facility] service.  Patient was  transferred to [facility] service on 05/14/2019. -Currently afebrile and hemodynamic stable.   2. Aortic valve endocarditis. SP aortic valve replacement 04/28/2019 and tricuspid repair Cardiothoracic surgery following. Clinically improving.  On room air.   -Continue metoprolol  and Lasix  3. Acute CVAof the MCA territory. Embolic in nature. TPA was deferred as concern for septic emboli. S/P left common carotid arteriogram with complete revascularization of the occluded M3. Complicated by dissection and pseudoaneurysm of the left ICA S/P placement of 4.5 mm x 25 mm pipeline flow diverter stent. Initially was on aspirin and Brilinta. Brilinta was held for cardiothoracic surgery. Patient has been off Brilinta but on aspirin for cardiothoracic surgery. Patient has intracranial stenting therefore will require dual antiplatelet therapy with aspirin and Plavix per neurology: Continue this for 3 to 6 months Patient will follow up with Dr. Corliss Skains and decide on exact regimen. Patient will also require follow-up with neurology.  4. Right common femoral DVT, Right groin hematoma, IVC filter placement April 03, 2019. During the hospitalization patient was found to have right common femoral DVT. Patient also was found to have large right groin hematoma post vascular intervention Decision was made to place IVC filter placement since the patient was felt not a candidate for therapeutic anticoagulation given her septic emboli, dissection as well as hematoma.  5. Acute blood loss anemia. Iron deficiency anemia. B12 deficiency. Folate deficiency. Continue supplementation. H&H relatively stable. No active bleeding identified. No GI bleed.  6. Polysubstance abuse. UDS positive for THC, amphetamine admission. Suspected IV drug use as well with needle marks. Currently on oral pain medication. Out of the window for any withdrawals.  7. Anxiety. Currently on BuSpar, Lexapro, Seroquel. May not require Seroquel at discharge. As needed Ativan.  8. Hepatitis C antibody positive. Negative viral load. HIV screen negative. RPR negative. Will require outpatient follow-up and therapy.  9. Pain control. On as needed oxycodone.  Suggested patient to use Tylenol and  decrease dose of oxycodone. Tapering off now.   DVT prophylaxis: Heparin subcu  Code Status: Full code  Family Communication: None  Disposition Plan:  Status is: Inpatient  Remains inpatient appropriate because patient will need to be inpatient to complete IV antibiotic treatment.  Dispo: The patient is from: Home  Anticipated d/c is to: Home  Anticipated d/c date is: > 3 days  Patient currently is not medically stable to d/c.   Consultants:  CT surgery  Infectious disease  Procedures:  Aortic valve replacement and tricuspid valve repair on 4/20   Anti-infectives (From admission, onward)   Start     Dose/Rate Route Frequency Ordered Stop   05/03/19 1000  fluconazole (DIFLUCAN) tablet 150 mg     150 mg Oral  Once 05/03/19 0804 05/03/19 1243   04/28/19 0400  vancomycin (VANCOREADY) IVPB 1250 mg/250 mL  Status:  Discontinued     1,250 mg 166.7 mL/hr over 90 Minutes Intravenous To Surgery 04/27/19 1131 04/28/19 1451   04/28/19 0400  cefUROXime (ZINACEF) 1.5 g in sodium chloride 0.9 % 100 mL IVPB     1.5 g 200 mL/hr over 30 Minutes Intravenous To Surgery 04/27/19 1131 04/28/19 0819   04/28/19 0400  cefUROXime (ZINACEF) 750 mg in sodium chloride 0.9 % 100 mL IVPB     750 mg 200 mL/hr over 30 Minutes Intravenous To Surgery 04/27/19 1131 04/28/19 1313   04/15/19 0900  vancomycin (VANCOREADY) IVPB 750 mg/150 mL     750 mg 150 mL/hr over 60 Minutes Intravenous Every 8 hours 04/15/19 0821     04/13/19 1400  vancomycin (VANCOCIN) IVPB 1000 mg/200 mL premix  Status:  Discontinued     1,000 mg 200 mL/hr over 60 Minutes Intravenous Every 8 hours 04/13/19 0839 04/15/19 0821   04/12/19 2200  vancomycin (VANCOREADY) IVPB 750 mg/150 mL  Status:  Discontinued     750 mg 150 mL/hr over 60 Minutes Intravenous Every 8 hours 04/12/19 1500 04/13/19 0839   04/10/19 1336  vancomycin (VANCOCIN) IVPB 1000 mg/200 mL premix  Status:  Discontinued     1,000 mg 200 mL/hr over 60 Minutes Intravenous  Every 8 hours 04/10/19 1320 04/12/19 1500   04/08/19 1200  cefTRIAXone (ROCEPHIN) 2 g in sodium chloride 0.9 % 100 mL IVPB     2 g 200 mL/hr over 30 Minutes Intravenous Every 12 hours 04/08/19 1013 06/09/19 2359   04/08/19 0530  vancomycin (VANCOCIN) IVPB 1000 mg/200 mL premix  Status:  Discontinued     1,000 mg 200 mL/hr over 60 Minutes Intravenous Every 12 hours 04/07/19 1541 04/10/19 1320   04/07/19 1900  Ampicillin-Sulbactam (UNASYN) 3 g in sodium chloride 0.9 % 100 mL IVPB  Status:  Discontinued     3 g 200 mL/hr over 30 Minutes Intravenous Every 6 hours 04/07/19 1457 04/08/19 1013   04/07/19 1730  vancomycin (VANCOREADY) IVPB 1500 mg/300 mL     1,500 mg 150 mL/hr over 120 Minutes Intravenous  Once 04/07/19 1541 04/07/19 1937   04/04/19 1400  ceFAZolin (ANCEF) IVPB 2g/100 mL premix  Status:  Discontinued     2 g 200 mL/hr over 30 Minutes Intravenous Every 8 hours 04/04/19 1119 04/07/19 1457   04/04/19 0400  Vancomycin (VANCOCIN) 1,250 mg in sodium chloride 0.9 % 250 mL IVPB  Status:  Discontinued     1,250 mg 166.7 mL/hr over 90 Minutes Intravenous Every 12 hours 04/03/19 1635 04/04/19 1159   04/02/19 1800  ceFEPIme (MAXIPIME) 2 g in sodium chloride 0.9 % 100 mL IVPB  Status:  Discontinued     2 g 200 mL/hr over 30 Minutes Intravenous Every 8 hours 04/02/19 1218 04/04/19 1127   04/02/19 1600  vancomycin (VANCOCIN) IVPB 1000 mg/200 mL premix  Status:  Discontinued     1,000 mg 200 mL/hr over 60 Minutes Intravenous Every 12 hours 04/02/19 0100 04/03/19 1635   04/02/19 0200  piperacillin-tazobactam (ZOSYN) IVPB 3.375 g  Status:  Discontinued     3.375 g 12.5 mL/hr over 240 Minutes Intravenous Every 8 hours 04/02/19 0059 04/02/19 1219   04/02/19 0200  vancomycin (VANCOCIN) 1,500 mg in sodium chloride 0.9 % 500 mL IVPB     1,500 mg 250 mL/hr over 120 Minutes Intravenous  Once 04/02/19 0100 04/02/19 0923   04/01/19 2040  ceFAZolin (ANCEF) 2-4 GM/100ML-% IVPB    Note to Pharmacy:  [name]   : cabinet override      04/01/19 2040 04/02/19 0844        Subjective: Patient seen and examined at bedside.  Denies worsening shortness of breath, fever or chest pain.   Objective: Vitals:   05/15/19 0023 05/15/19 0500 05/15/19 0638 05/15/19 0826  BP: 97/73  103/74 109/73  Pulse: 100  92 94  Resp: 18  20 18   Temp: 98.7 F (37.1 C)  98.5 F (36.9 C) 98.5 F (36.9 C)  TempSrc: Oral  Oral Oral  SpO2: 100%  99% 100%  Weight:  74.9 kg    Height:        Intake/Output Summary (Last 24 hours) at 05/15/2019 1058 Last data filed at 05/15/2019 0100 Gross per 24 hour  Intake 200 ml  Output --  Net 200 ml   Filed Weights   05/13/19 0416 05/14/19 0638 05/15/19 0500  Weight: 73.8 kg 74.3 kg 74.9 kg    Examination:  General exam: No acute distress.   Respiratory system: Bilateral decreased breath sounds at bases, no wheezing  cardiovascular system: Rate controlled, S1-S2 heard  gastrointestinal system: Abdomen is nondistended, soft and nontender.  Normal bowel sounds heard Extremities: No edema or clubbing   Data Reviewed: I have personally reviewed following labs and imaging studies  CBC: Recent Labs  Lab 05/11/19 0500  WBC 5.6  NEUTROABS 2.9  HGB 8.5*  HCT 27.8*  MCV 88.0  PLT 355*   Basic Metabolic Panel: Recent Labs  Lab 05/09/19 0500 05/11/19 0500 05/13/19 0500  NA 139 139 138  K 4.1 4.1 4.1  CL 103 102 106  CO2 27 26 25   GLUCOSE 94 86 90  BUN 9 17 15   CREATININE 0.68 0.61 0.67  CALCIUM 8.8* 8.6* 8.7*  MG  --  1.9  --   PHOS  --  4.5  --    GFR: Estimated Creatinine Clearance: 106.3 mL/min (by C-G formula based on SCr of 0.67 mg/dL). Liver Function Tests: No results for input(s): AST, ALT, ALKPHOS, BILITOT, PROT, ALBUMIN in the last 168 hours. No results for input(s): LIPASE, AMYLASE in the last 168 hours. No results for input(s): AMMONIA in the last 168 hours. Coagulation Profile: No results for input(s): INR, PROTIME in the last  168 hours. Cardiac Enzymes: No results for input(s): CKTOTAL, CKMB, CKMBINDEX, TROPONINI in the last 168 hours. BNP (last 3 results) No results for input(s): PROBNP in the last 8760 hours. HbA1C: No results for input(s): HGBA1C in the last 72 hours. CBG: No results for input(s): GLUCAP in the last 168 hours.  Lipid Profile: No results for input(s): CHOL, HDL, LDLCALC, TRIG, CHOLHDL, LDLDIRECT in the last 72 hours. Thyroid Function Tests: No results for input(s): TSH, T4TOTAL, FREET4, T3FREE, THYROIDAB in the last 72 hours. Anemia Panel: No results for input(s): VITAMINB12, FOLATE, FERRITIN, TIBC, IRON, RETICCTPCT in the last 72 hours. Sepsis Labs: No results for input(s): PROCALCITON, LATICACIDVEN in the last 168 hours.  Recent Results (from the past 240 hour(s))  Culture, blood (routine x 2)     Status: None   Collection Time: 05/06/19 12:14 PM   Specimen: BLOOD LEFT HAND  Result Value Ref Range Status   Specimen Description BLOOD LEFT HAND  Final   Special Requests   Final    BOTTLES DRAWN AEROBIC ONLY Blood Culture results may not be optimal due to an inadequate volume of blood received in culture bottles   Culture   Final    NO GROWTH 5 DAYS Performed at [facility], [address] [place], [place] [address]    Report Status 05/11/2019 FINAL  Final  Culture, blood (routine x 2)     Status: None   Collection Time: 05/06/19 12:32 PM   Specimen: BLOOD LEFT HAND  Result Value Ref Range Status   Specimen Description BLOOD LEFT HAND  Final   Special Requests   Final    BOTTLES DRAWN AEROBIC ONLY Blood Culture results may not be optimal due to an inadequate volume of blood received in culture bottles   Culture   Final    NO GROWTH 5 DAYS Performed at [facility], [address], [place], [place] [address]    Report Status 05/11/2019 FINAL  Final         Radiology Studies: [place] EKG SITE RITE  Result Date: 05/13/2019 If Site Rite image not attached, placement  could not be confirmed due to current cardiac rhythm.       Scheduled Meds: . aspirin EC  81 mg Oral Daily   Or  . aspirin  81 mg Per Tube Daily  . bisacodyl  10 mg Oral Daily   Or  . bisacodyl  10 mg Rectal Daily  . busPIRone  7.5 mg Oral BID  . Chlorhexidine Gluconate Cloth  6 each Topical Daily  . clopidogrel  75 mg Oral Daily  . [facility] Patient [facility] Family Education   Does not apply Once  . docusate sodium  200 mg Oral Daily  . escitalopram  10 mg Oral Daily  . feeding supplement (ENSURE ENLIVE)  237 mL Oral BID BM  . ferrous fumarate-b12-vitamic C-folic acid  1 capsule Oral BID PC  . furosemide  20 mg Oral Daily  . metoprolol tartrate  6.25 mg Oral BID  . pantoprazole  40 mg Oral Daily  . pneumococcal 23 valent vaccine  0.5 mL Intramuscular Tomorrow-1000  . potassium chloride  20 mEq Oral TID  . QUEtiapine  25 mg Oral QHS  . sodium chloride flush  3 mL Intravenous Q12H   Continuous Infusions: . sodium chloride    . sodium chloride    . cefTRIAXone (ROCEPHIN)  IV 2 g (05/14/19 2150)  . lactated ringers Stopped (04/30/19 2141)  . vancomycin 750 mg (05/15/19 0955)          Glade Lloyd, MD [facility] 05/15/2019, 10:58 AM

## 2019-05-15 NOTE — Plan of Care (Signed)
  Problem: Education: Goal: Knowledge of disease or condition will improve Outcome: Progressing Goal: Knowledge of patient specific risk factors addressed and post discharge goals established will improve Outcome: Progressing   Problem: Coping: Goal: Will verbalize positive feelings about self Outcome: Progressing   Problem: Clinical Measurements: Goal: Ability to maintain clinical measurements within normal limits will improve Outcome: Progressing   Problem: Activity: Goal: Risk for activity intolerance will decrease Outcome: Progressing   Problem: Pain Managment: Goal: General experience of comfort will improve Outcome: Progressing

## 2019-05-16 NOTE — Progress Notes (Signed)
Pharmacy Antibiotic Note Lauren Reyes is a 29 y.o. female with endocarditis involving aortic, mitral, and tricuspid valves with severe aortic regurgitation. Patient's condition is further complicated by septic emboli to her brain, lungs, and spleen. Respiratory culture grew MSSA, however, recent blood cultures are negative. Aortic valve tissue culture showing gram stain of rare gram positive cocci. She is s/p AVR/TVRepair on 4/20. Vancomycin doses are being adjusted on troughs alone due to concern of cerebritis.   No BMP or documented urine output to assess renal function. Patient afebrile.   Plan: - Continue Vancomycin 750mg  Q 8 hr  - Order next vancomycin trough on 5/10 - Continue ceftriaxone IV 2g q12 per ID - Monitor renal function, clinical status - End date for antibiotics: 06/08/19  Height: 5\' 6"  (167.6 cm) Weight: 75.8 kg (167 lb 3.2 oz) IBW/kg (Calculated) : 59.3  Temp (24hrs), Avg:98.1 F (36.7 C), Min:97.7 F (36.5 C), Max:98.3 F (36.8 C)  Recent Labs  Lab 05/11/19 0500 05/13/19 0500  WBC 5.6  --   CREATININE 0.61 0.67  VANCOTROUGH  --  21*    Estimated Creatinine Clearance: 107 mL/min (by C-G formula based on SCr of 0.67 mg/dL).    No Known Allergies  Antimicrobials this admission: Zosyn 3/25 >> 3/25 Cefepime 3/25 >> 3/27 Ancef 3/27 >> 3/30 Vanc 3/25 >> 3/27; restarted 3/30 >> (5/31) Unasyn 3/30>>3/31 Ceftriaxone 3/31>> (5/31) Flucon 4/25 Clotrimazole cream 4/25> 5/1   4/28 VT 16  4/21 VT 16 4/16 VT 17  4/10 VT (drawn 6hr after dose) 16  4/7 VT 23  Microbiology results: 3/24 COVID/Flu >> neg 3/25 RCx >> moderate MSSA 3/25 MRSA PCR >> negative 3/25 BCx >> negative  4/16 MRSA PCR >> neg 4/20 MRSA PCR >> neg 4/20 Tricuspid valve specimen: neg 4/20 pericardial fluid: neg 4/20 aortic valve leaflets: 1 with rare gram positive cocci   Thank you for allowing pharmacy to be a part of this patient's care.  5/20, PharmD PGY-1  Pharmacy Resident   Please check amion for clinical pharmacist contact number

## 2019-05-16 NOTE — Progress Notes (Signed)
Patient ID: Lauren Reyes, female   DOB: 1990-05-01, 29 y.o.   MRN: [id number]  PROGRESS NOTE    Lauren Reyes  XIP:[id number] DOB: 1990-12-28 DOA: 04/01/2019 PCP: Patient, No Pcp Per   Brief Narrative:  29 year old female with history of substance abuse with heroin, crystal meth and cocaine presented to the ER with aphasia and right-sided weakness and code stroke.  Admitted to neurology ICU.  Patient was intubated and underwent thrombectomy for left MCA M3 occlusion and recanalization.  This was complicated by dissection and pseudoaneurysm of the left ICA status post stent placement.  Extubated on April 04, 2019.  She was found to have right common femoral vein DVT along with right groin hematoma which is treated with IVC filter placement.  TEE showed infective endocarditis.  Status post bioprosthetic aortic valve replacement and tricuspid valve repair on 04/28/2019.  Currently on IV antibiotics till 06/08/2019.  Assessment & Plan:   1. 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 Severe AR. S/P aortic valve replacement with bioprosthetic valve and tricuspid repair Initially presented with code stroke, intubated and admitted to the neuro ICU. Tracheal aspirate grew MSSA. So far tissue culture with no growth to date. Remains on IV vancomycin and IV ceftriaxone. 6 weeks of antibiotics post surgery through Jun 08, 2019. On the path report staining looks like CMV in some areas.  Followed by ID. Patient will remain in a supervised setting for completion of the antibiotics. Repeat cultures 4/28 with no growth.  No fever. -CT surgery requested that patient be taken over by [facility] service.  Patient was  transferred to [facility] service on 05/14/2019. -Currently afebrile and hemodynamic stable.   2. Aortic valve endocarditis. SP aortic valve replacement 04/28/2019 and tricuspid repair Cardiothoracic surgery following. Clinically improving.  On room air.   -Continue metoprolol  and Lasix  3. Acute CVAof the MCA territory. Embolic in nature. TPA was deferred as concern for septic emboli. S/P left common carotid arteriogram with complete revascularization of the occluded M3. Complicated by dissection and pseudoaneurysm of the left ICA S/P placement of 4.5 mm x 25 mm pipeline flow diverter stent. Initially was on aspirin and Brilinta. Brilinta was held for cardiothoracic surgery. Patient has been off Brilinta but on aspirin for cardiothoracic surgery. Patient has intracranial stenting therefore will require dual antiplatelet therapy with aspirin and Plavix per neurology: Continue this for 3 to 6 months Patient will follow up with Dr. Corliss Skains and decide on exact regimen. Patient will also require follow-up with neurology.  4. Right common femoral DVT, Right groin hematoma, IVC filter placement April 03, 2019. During the hospitalization patient was found to have right common femoral DVT. Patient also was found to have large right groin hematoma post vascular intervention Decision was made to place IVC filter placement since the patient was felt not a candidate for therapeutic anticoagulation given her septic emboli, dissection as well as hematoma.  5. Acute blood loss anemia. Iron deficiency anemia. B12 deficiency. Folate deficiency. Continue supplementation. H&H relatively stable. No active bleeding identified. No GI bleed.  6. Polysubstance abuse. UDS positive for THC, amphetamine admission. Suspected IV drug use as well with needle marks. Currently on oral pain medication. Out of the window for any withdrawals.  7. Anxiety. Currently on BuSpar, Lexapro, Seroquel. May not require Seroquel at discharge. As needed Ativan.  8. Hepatitis C antibody positive. Negative viral load. HIV screen negative. RPR negative. Will require outpatient follow-up and therapy.  9. Pain control. On as needed oxycodone.  Suggested patient to use Tylenol and  decrease dose of oxycodone. Tapering off now.   DVT prophylaxis: Heparin subcu  Code Status: Full code  Family Communication: None  Disposition Plan:  Status is: Inpatient  Remains inpatient appropriate because patient will need to be inpatient to complete IV antibiotic treatment.  Dispo: The patient is from: Home  Anticipated d/c is to: Home  Anticipated d/c date is: > 3 days  Patient currently is not medically stable to d/c.   Consultants:  CT surgery  Infectious disease  Procedures:  Aortic valve replacement and tricuspid valve repair on 4/20   Anti-infectives (From admission, onward)   Start     Dose/Rate Route Frequency Ordered Stop   05/03/19 1000  fluconazole (DIFLUCAN) tablet 150 mg     150 mg Oral  Once 05/03/19 0804 05/03/19 1243   04/28/19 0400  vancomycin (VANCOREADY) IVPB 1250 mg/250 mL  Status:  Discontinued     1,250 mg 166.7 mL/hr over 90 Minutes Intravenous To Surgery 04/27/19 1131 04/28/19 1451   04/28/19 0400  cefUROXime (ZINACEF) 1.5 g in sodium chloride 0.9 % 100 mL IVPB     1.5 g 200 mL/hr over 30 Minutes Intravenous To Surgery 04/27/19 1131 04/28/19 0819   04/28/19 0400  cefUROXime (ZINACEF) 750 mg in sodium chloride 0.9 % 100 mL IVPB     750 mg 200 mL/hr over 30 Minutes Intravenous To Surgery 04/27/19 1131 04/28/19 1313   04/15/19 0900  vancomycin (VANCOREADY) IVPB 750 mg/150 mL     750 mg 150 mL/hr over 60 Minutes Intravenous Every 8 hours 04/15/19 0821     04/13/19 1400  vancomycin (VANCOCIN) IVPB 1000 mg/200 mL premix  Status:  Discontinued     1,000 mg 200 mL/hr over 60 Minutes Intravenous Every 8 hours 04/13/19 0839 04/15/19 0821   04/12/19 2200  vancomycin (VANCOREADY) IVPB 750 mg/150 mL  Status:  Discontinued     750 mg 150 mL/hr over 60 Minutes Intravenous Every 8 hours 04/12/19 1500 04/13/19 0839   04/10/19 1336  vancomycin (VANCOCIN) IVPB 1000 mg/200 mL premix  Status:  Discontinued     1,000 mg 200 mL/hr over 60 Minutes Intravenous  Every 8 hours 04/10/19 1320 04/12/19 1500   04/08/19 1200  cefTRIAXone (ROCEPHIN) 2 g in sodium chloride 0.9 % 100 mL IVPB     2 g 200 mL/hr over 30 Minutes Intravenous Every 12 hours 04/08/19 1013 06/09/19 2359   04/08/19 0530  vancomycin (VANCOCIN) IVPB 1000 mg/200 mL premix  Status:  Discontinued     1,000 mg 200 mL/hr over 60 Minutes Intravenous Every 12 hours 04/07/19 1541 04/10/19 1320   04/07/19 1900  Ampicillin-Sulbactam (UNASYN) 3 g in sodium chloride 0.9 % 100 mL IVPB  Status:  Discontinued     3 g 200 mL/hr over 30 Minutes Intravenous Every 6 hours 04/07/19 1457 04/08/19 1013   04/07/19 1730  vancomycin (VANCOREADY) IVPB 1500 mg/300 mL     1,500 mg 150 mL/hr over 120 Minutes Intravenous  Once 04/07/19 1541 04/07/19 1937   04/04/19 1400  ceFAZolin (ANCEF) IVPB 2g/100 mL premix  Status:  Discontinued     2 g 200 mL/hr over 30 Minutes Intravenous Every 8 hours 04/04/19 1119 04/07/19 1457   04/04/19 0400  Vancomycin (VANCOCIN) 1,250 mg in sodium chloride 0.9 % 250 mL IVPB  Status:  Discontinued     1,250 mg 166.7 mL/hr over 90 Minutes Intravenous Every 12 hours 04/03/19 1635 04/04/19 1159   04/02/19 1800  ceFEPIme (MAXIPIME) 2 g in sodium chloride 0.9 % 100 mL IVPB  Status:  Discontinued     2 g 200 mL/hr over 30 Minutes Intravenous Every 8 hours 04/02/19 1218 04/04/19 1127   04/02/19 1600  vancomycin (VANCOCIN) IVPB 1000 mg/200 mL premix  Status:  Discontinued     1,000 mg 200 mL/hr over 60 Minutes Intravenous Every 12 hours 04/02/19 0100 04/03/19 1635   04/02/19 0200  piperacillin-tazobactam (ZOSYN) IVPB 3.375 g  Status:  Discontinued     3.375 g 12.5 mL/hr over 240 Minutes Intravenous Every 8 hours 04/02/19 0059 04/02/19 1219   04/02/19 0200  vancomycin (VANCOCIN) 1,500 mg in sodium chloride 0.9 % 500 mL IVPB     1,500 mg 250 mL/hr over 120 Minutes Intravenous  Once 04/02/19 0100 04/02/19 0923   04/01/19 2040  ceFAZolin (ANCEF) 2-4 GM/100ML-% IVPB    Note to Pharmacy:  Channing Mutters   : cabinet override      04/01/19 2040 04/02/19 0844        Subjective: Patient seen and examined at bedside. No new complaints. No overnight fever or vomiting reported.   Objective: Vitals:   05/16/19 0023 05/16/19 0105 05/16/19 0106 05/16/19 0507  BP: 97/72  (!) 87/59 95/71  Pulse: (!) 102  97 97  Resp: 18 16 16 16   Temp: 97.7 F (36.5 C) 98.3 F (36.8 C) 98.3 F (36.8 C) 98.1 F (36.7 C)  TempSrc: Oral Oral Oral Oral  SpO2: 98%  100% 100%  Weight:    75.8 kg  Height:        Intake/Output Summary (Last 24 hours) at 05/16/2019 0742 Last data filed at 05/15/2019 2334 Gross per 24 hour  Intake 1340 ml  Output -  Net 1340 ml   Filed Weights   05/14/19 0638 05/15/19 0500 05/16/19 0507  Weight: 74.3 kg 74.9 kg 75.8 kg    Examination:  General exam: No distress. Respiratory system: Bilateral decreased breath sounds at bases with some scattered crackles  cardiovascular system: S1-S2 heard, rate controlled  gastrointestinal system: Abdomen is nondistended, soft and nontender.  Normal bowel sounds heard Extremities: No cyanosis, edema or clubbing  Data Reviewed: I have personally reviewed following labs and imaging studies  CBC: Recent Labs  Lab 05/11/19 0500  WBC 5.6  NEUTROABS 2.9  HGB 8.5*  HCT 27.8*  MCV 88.0  PLT 596*   Basic Metabolic Panel: Recent Labs  Lab 05/11/19 0500 05/13/19 0500  NA 139 138  K 4.1 4.1  CL 102 106  CO2 26 25  GLUCOSE 86 90  BUN 17 15  CREATININE 0.61 0.67  CALCIUM 8.6* 8.7*  MG 1.9  --   PHOS 4.5  --    GFR: Estimated Creatinine Clearance: 107 mL/min (by C-G formula based on SCr of 0.67 mg/dL). Liver Function Tests: No results for input(s): AST, ALT, ALKPHOS, BILITOT, PROT, ALBUMIN in the last 168 hours. No results for input(s): LIPASE, AMYLASE in the last 168 hours. No results for input(s): AMMONIA in the last 168 hours. Coagulation Profile: No results for input(s): INR, PROTIME in the last 168 hours.  Cardiac Enzymes: No results for input(s): CKTOTAL, CKMB, CKMBINDEX, TROPONINI in the last 168 hours. BNP (last 3 results) No results for input(s): PROBNP in the last 8760 hours. HbA1C: No results for input(s): HGBA1C in the last 72 hours. CBG: Recent Labs  Lab 05/15/19 2101  GLUCAP 92   Lipid Profile: No results for input(s): CHOL, HDL, LDLCALC, TRIG, CHOLHDL, LDLDIRECT  in the last 72 hours. Thyroid Function Tests: No results for input(s): TSH, T4TOTAL, FREET4, T3FREE, THYROIDAB in the last 72 hours. Anemia Panel: No results for input(s): VITAMINB12, FOLATE, FERRITIN, TIBC, IRON, RETICCTPCT in the last 72 hours. Sepsis Labs: No results for input(s): PROCALCITON, LATICACIDVEN in the last 168 hours.  Recent Results (from the past 240 hour(s))  Culture, blood (routine x 2)     Status: None   Collection Time: 05/06/19 12:14 PM   Specimen: BLOOD LEFT HAND  Result Value Ref Range Status   Specimen Description BLOOD LEFT HAND  Final   Special Requests   Final    BOTTLES DRAWN AEROBIC ONLY Blood Culture results may not be optimal due to an inadequate volume of blood received in culture bottles   Culture   Final    NO GROWTH 5 DAYS Performed at [facility], [place] [address] [place], [place] [address]    Report Status 05/11/2019 FINAL  Final  Culture, blood (routine x 2)     Status: None   Collection Time: 05/06/19 12:32 PM   Specimen: BLOOD LEFT HAND  Result Value Ref Range Status   Specimen Description BLOOD LEFT HAND  Final   Special Requests   Final    BOTTLES DRAWN AEROBIC ONLY Blood Culture results may not be optimal due to an inadequate volume of blood received in culture bottles   Culture   Final    NO GROWTH 5 DAYS Performed at [facility], [place] [address] [place], [place] [address]    Report Status 05/11/2019 FINAL  Final         Radiology Studies: No results found.      Scheduled Meds: . aspirin EC  81 mg Oral Daily   Or  . aspirin  81 mg  Per Tube Daily  . bisacodyl  10 mg Oral Daily   Or  . bisacodyl  10 mg Rectal Daily  . busPIRone  7.5 mg Oral BID  . Chlorhexidine Gluconate Cloth  6 each Topical Daily  . clopidogrel  75 mg Oral Daily  . [facility], Patient & Family Education   Does not apply Once  . docusate sodium  200 mg Oral Daily  . escitalopram  10 mg Oral Daily  . feeding supplement (ENSURE ENLIVE)  237 mL Oral BID BM  . ferrous [id number] C-folic acid  1 capsule Oral BID PC  . furosemide  20 mg Oral Daily  . metoprolol tartrate  6.25 mg Oral BID  . pantoprazole  40 mg Oral Daily  . pneumococcal 23 valent vaccine  0.5 mL Intramuscular Tomorrow-1000  . potassium chloride  20 mEq Oral TID  . QUEtiapine  25 mg Oral QHS  . sodium chloride flush  3 mL Intravenous Q12H   Continuous Infusions: . sodium chloride    . sodium chloride    . cefTRIAXone (ROCEPHIN)  IV 2 g (05/15/19 2203)  . lactated ringers Stopped (04/30/19 2141)  . vancomycin 750 mg (05/16/19 0020)          Aline August, MD [facility] 05/16/2019, 7:42 AM

## 2019-05-16 NOTE — Progress Notes (Addendum)
[address]       [place] [address]             [phone]      18 Days Post-Op Procedure(s) (LRB): AORTIC VALVE REPLACEMENT (AVR) using Edwards PERIMOUNT Magna Ease Pericardial Bioprosthesis - 29 MM Aortic Valve. (N/A) REPAIR OF TRICUSPID (N/A) TRANSESOPHAGEAL ECHOCARDIOGRAM (TEE) (N/A) Subjective: Feels fine, no new issues  Objective: Vital signs in last 24 hours: Temp:  [97.6 F (36.4 C)-98.3 F (36.8 C)] 98.3 F (36.8 C) (05/08 0811) Pulse Rate:  [97-102] 100 (05/08 0811) Cardiac Rhythm: Sinus tachycardia (05/08 0811) Resp:  [16-18] 16 (05/08 0811) BP: (87-111)/(59-86) 97/74 (05/08 0811) SpO2:  [98 %-100 %] 100 % (05/08 0811) Weight:  [75.8 kg] 75.8 kg (05/08 0507)  Hemodynamic parameters for last 24 hours:    Intake/Output from previous day: 05/07 0701 - 05/08 0700 In: 1340 [P.O.:240; IV Piggyback:1100] Out: -  Intake/Output this shift: No intake/output data recorded.  General appearance: alert, cooperative and no distress Heart: regular rate and rhythm Lungs: clear to auscultation bilaterally Abdomen: benign Extremities: no edema Wound: incis healing well  Lab Results: No results for input(s): WBC, HGB, HCT, PLT in the last 72 hours. BMET: No results for input(s): NA, K, CL, CO2, GLUCOSE, BUN, CREATININE, CALCIUM in the last 72 hours.  PT/INR: No results for input(s): LABPROT, INR in the last 72 hours. ABG    Component Value Date/Time   PHART 7.365 04/28/2019 2205   HCO3 24.5 04/28/2019 2205   TCO2 26 04/28/2019 2205   ACIDBASEDEF 1.0 04/28/2019 2205   O2SAT 97.0 04/28/2019 2205   CBG (last 3)  Recent Labs    05/15/19 2101  GLUCAP 92    Meds Scheduled Meds: . aspirin EC  81 mg Oral Daily   Or  . aspirin  81 mg Per Tube Daily  . bisacodyl  10 mg Oral Daily   Or  . bisacodyl  10 mg Rectal Daily  . busPIRone  7.5 mg Oral BID  . Chlorhexidine Gluconate Cloth  6 each Topical Daily  . clopidogrel  75 mg Oral Daily    . [facility], Patient & Family Education   Does not apply Once  . docusate sodium  200 mg Oral Daily  . escitalopram  10 mg Oral Daily  . feeding supplement (ENSURE ENLIVE)  237 mL Oral BID BM  . ferrous [id number] C-folic acid  1 capsule Oral BID PC  . furosemide  20 mg Oral Daily  . metoprolol tartrate  6.25 mg Oral BID  . pantoprazole  40 mg Oral Daily  . pneumococcal 23 valent vaccine  0.5 mL Intramuscular Tomorrow-1000  . potassium chloride  20 mEq Oral TID  . QUEtiapine  25 mg Oral QHS  . sodium chloride flush  3 mL Intravenous Q12H   Continuous Infusions: . sodium chloride    . sodium chloride    . cefTRIAXone (ROCEPHIN)  IV 2 g (05/15/19 2203)  . lactated ringers Stopped (04/30/19 2141)  . vancomycin 750 mg (05/16/19 0020)   PRN Meds:.sodium chloride, acetaminophen, diphenhydrAMINE, ondansetron (ZOFRAN) IV, oxyCODONE, polyethylene glycol, sodium chloride flush  Xrays No results found.  Assessment/Plan: S/P Procedure(s) (LRB): AORTIC VALVE REPLACEMENT (AVR) using Edwards PERIMOUNT Magna Ease Pericardial Bioprosthesis - 29 MM Aortic Valve. (N/A) REPAIR OF TRICUSPID (N/A) TRANSESOPHAGEAL ECHOCARDIOGRAM (TEE) (N/A)  1 conts to make good progress on current RX/Tx plan, no current surgical issues- cont same management   LOS:  45 days    Rowe Clack  PA-C Pager [phone] 05/16/2019  I have seen and examined Lauren Reyes and agree with the above assessment  and plan.  Delight Ovens MD Beeper [phone] Office [phone] 05/16/2019 1:39 PM

## 2019-05-17 LAB — BASIC METABOLIC PANEL
Anion gap: 8 (ref 5–15)
BUN: 18 mg/dL (ref 6–20)
CO2: 24 mmol/L (ref 22–32)
Calcium: 8.6 mg/dL — ABNORMAL LOW (ref 8.9–10.3)
Chloride: 107 mmol/L (ref 98–111)
Creatinine, Ser: 0.59 mg/dL (ref 0.44–1.00)
GFR calc Af Amer: >60 mL/min (ref >60)
GFR calc non Af Amer: >60 mL/min (ref >60)
Glucose, Bld: 94 mg/dL (ref 70–99)
Potassium: 4.4 mmol/L (ref 3.5–5.1)
Sodium: 139 mmol/L (ref 135–145)

## 2019-05-17 NOTE — Progress Notes (Addendum)
[address]       [place] [address]             [phone]      19 Days Post-Op Procedure(s) (LRB): AORTIC VALVE REPLACEMENT (AVR) using Edwards PERIMOUNT Magna Ease Pericardial Bioprosthesis - 29 MM Aortic Valve. (N/A) REPAIR OF TRICUSPID (N/A) TRANSESOPHAGEAL ECHOCARDIOGRAM (TEE) (N/A) Subjective: No new c/o- feels well  Objective: Vital signs in last 24 hours: Temp:  [97.9 F (36.6 C)-98.3 F (36.8 C)] 98.3 F (36.8 C) (05/09 0841) Pulse Rate:  [89-103] 98 (05/09 0841) Cardiac Rhythm: Normal sinus rhythm (05/09 0349) Resp:  [17-18] 18 (05/09 0841) BP: (93-126)/(62-86) 97/79 (05/09 0841) SpO2:  [95 %-100 %] 100 % (05/09 0841) Weight:  [76.8 kg] 76.8 kg (05/09 0349)  Hemodynamic parameters for last 24 hours:    Intake/Output from previous day: 05/08 0701 - 05/09 0700 In: 120 [P.O.:120] Out: -  Intake/Output this shift: No intake/output data recorded.  General appearance: alert, cooperative and no distress Heart: regular rate and rhythm Lungs: clear to auscultation bilaterally Abdomen: benign Extremities: no edema Wound: incis healing well  Lab Results: No results for input(s): WBC, HGB, HCT, PLT in the last 72 hours. BMET:  Recent Labs    05/17/19 0504  NA 139  K 4.4  CL 107  CO2 24  GLUCOSE 94  BUN 18  CREATININE 0.59  CALCIUM 8.6*    PT/INR: No results for input(s): LABPROT, INR in the last 72 hours. ABG    Component Value Date/Time   PHART 7.365 04/28/2019 2205   HCO3 24.5 04/28/2019 2205   TCO2 26 04/28/2019 2205   ACIDBASEDEF 1.0 04/28/2019 2205   O2SAT 97.0 04/28/2019 2205   CBG (last 3)  Recent Labs    05/15/19 2101  GLUCAP 92    Meds Scheduled Meds: . aspirin EC  81 mg Oral Daily   Or  . aspirin  81 mg Per Tube Daily  . bisacodyl  10 mg Oral Daily   Or  . bisacodyl  10 mg Rectal Daily  . busPIRone  7.5 mg Oral BID  . Chlorhexidine Gluconate Cloth  6 each Topical Daily  . clopidogrel  75 mg Oral  Daily  . [facility], Patient & Family Education   Does not apply Once  . docusate sodium  200 mg Oral Daily  . escitalopram  10 mg Oral Daily  . feeding supplement (ENSURE ENLIVE)  237 mL Oral BID BM  . ferrous fumarate-b12-vitamic C-folic acid  1 capsule Oral BID PC  . furosemide  20 mg Oral Daily  . metoprolol tartrate  6.25 mg Oral BID  . pantoprazole  40 mg Oral Daily  . pneumococcal 23 valent vaccine  0.5 mL Intramuscular Tomorrow-1000  . potassium chloride  20 mEq Oral TID  . QUEtiapine  25 mg Oral QHS  . sodium chloride flush  3 mL Intravenous Q12H   Continuous Infusions: . sodium chloride    . sodium chloride    . cefTRIAXone (ROCEPHIN)  IV 2 g (05/16/19 2150)  . lactated ringers Stopped (04/30/19 2141)  . vancomycin 750 mg (05/17/19 0001)   PRN Meds:.sodium chloride, acetaminophen, diphenhydrAMINE, ondansetron (ZOFRAN) IV, oxyCODONE, polyethylene glycol, sodium chloride flush  Xrays No results found.  Assessment/Plan: S/P Procedure(s) (LRB): AORTIC VALVE REPLACEMENT (AVR) using Edwards PERIMOUNT Magna Ease Pericardial Bioprosthesis - 29 MM Aortic Valve. (N/A) REPAIR OF TRICUSPID (N/A) TRANSESOPHAGEAL ECHOCARDIOGRAM (TEE) (N/A)  1 conts to be very  stable on current management- cont same 2 lytes, renal fxn remain normal  LOS: 46 days    Lauren Giovanni PA-C Pager [phone] 05/17/2019  Stable I have seen and examined Lauren Reyes and agree with the above assessment  and plan.  Grace Isaac MD [facility] [phone] Office [phone] 05/17/2019 1:54 PM

## 2019-05-17 NOTE — Progress Notes (Signed)
Patient ID: Lauren Reyes, female   DOB: November 10, 1990, 29 y.o.   MRN: [id number]  PROGRESS NOTE    Lauren Reyes  HYW:[id number] DOB: [birth year] DOA: 04/01/2019 PCP: Patient, No Pcp Per   Brief Narrative:  29 year old female with history of substance abuse with heroin, crystal meth and cocaine presented to the ER with aphasia and right-sided weakness and code stroke.  Admitted to neurology ICU.  Patient was intubated and underwent thrombectomy for left MCA M3 occlusion and recanalization.  This was complicated by dissection and pseudoaneurysm of the left ICA status post stent placement.  Extubated on April 04, 2019.  She was found to have right common femoral vein DVT along with right groin hematoma which is treated with IVC filter placement.  TEE showed infective endocarditis.  Status post bioprosthetic aortic valve replacement and tricuspid valve repair on 04/28/2019.  Currently on IV antibiotics till 06/08/2019.  Assessment & Plan:   1. 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 Severe AR. S/P aortic valve replacement with bioprosthetic valve and tricuspid repair Initially presented with code stroke, intubated and admitted to the neuro ICU. Tracheal aspirate grew MSSA. So far tissue culture with no growth to date. Remains on IV vancomycin and IV ceftriaxone. 6 weeks of antibiotics post surgery through Jun 08, 2019. On the path report staining looks like CMV in some areas.  Followed by ID. Patient will remain in a supervised setting for completion of the antibiotics. Repeat cultures 4/28 with no growth.  -CT surgery requested that patient be taken over by [facility] service.  Patient was  transferred to [facility] service on 05/14/2019. -Currently afebrile and hemodynamic stable.  Monitor labs intermittently.   2. Aortic valve endocarditis. SP aortic valve replacement 04/28/2019 and tricuspid repair Cardiothoracic surgery following. Clinically improving.  On room air.     -Continue metoprolol and Lasix  3. Acute CVAof the MCA territory. Embolic in nature. TPA was deferred as concern for septic emboli. S/P left common carotid arteriogram with complete revascularization of the occluded M3. Complicated by dissection and pseudoaneurysm of the left ICA S/P placement of 4.5 mm x 25 mm pipeline flow diverter stent. Initially was on aspirin and Brilinta. Brilinta was held for cardiothoracic surgery. Patient has been off Pixley but on aspirin for cardiothoracic surgery. Patient has intracranial stenting therefore will require dual antiplatelet therapy with aspirin and Plavix per neurology: Continue this for 3 to 6 months Patient will follow up with Dr. Estanislado Pandy and decide on exact regimen. Patient will also require follow-up with neurology.  4. Right common femoral DVT, Right groin hematoma, IVC filter placement April 03, 2019. During the hospitalization patient was found to have right common femoral DVT. Patient also was found to have large right groin hematoma post vascular intervention Decision was made to place IVC filter placement since the patient was felt not a candidate for therapeutic anticoagulation given her septic emboli, dissection as well as hematoma.  5. Acute blood loss anemia. Iron deficiency anemia. B12 deficiency. Folate deficiency. Continue supplementation. H&H relatively stable.  Monitor intermittently. No active bleeding identified. No GI bleed.  6. Polysubstance abuse. UDS positive for THC, amphetamine admission. Suspected IV drug use as well with needle marks. Currently on oral pain medication. Out of the window for any withdrawals.  7. Anxiety. Currently on BuSpar, Lexapro, Seroquel. May not require Seroquel at discharge. As needed Ativan.  8. Hepatitis C antibody positive. Negative viral load. HIV screen negative. RPR negative. Will require outpatient follow-up and therapy.  9. Pain  control. On as needed  oxycodone.  Suggested patient to use Tylenol and decrease dose of oxycodone. Tapering off now.   DVT prophylaxis: Heparin subcu  Code Status: Full code  Family Communication: None  Disposition Plan:  Status is: Inpatient  Remains inpatient appropriate because patient will need to be inpatient to complete IV antibiotic treatment till 06/08/2019.  Dispo: The patient is from: Home  Anticipated d/c is to: Home  Anticipated d/c date is: > 3 days  Patient currently is not medically stable to d/c.   Consultants:  CT surgery  Infectious disease  Procedures:  Aortic valve replacement and tricuspid valve repair on 4/20   Anti-infectives (From admission, onward)   Start     Dose/Rate Route Frequency Ordered Stop   05/03/19 1000  fluconazole (DIFLUCAN) tablet 150 mg     150 mg Oral  Once 05/03/19 0804 05/03/19 1243   04/28/19 0400  vancomycin (VANCOREADY) IVPB 1250 mg/250 mL  Status:  Discontinued     1,250 mg 166.7 mL/hr over 90 Minutes Intravenous To Surgery 04/27/19 1131 04/28/19 1451   04/28/19 0400  cefUROXime (ZINACEF) 1.5 g in sodium chloride 0.9 % 100 mL IVPB     1.5 g 200 mL/hr over 30 Minutes Intravenous To Surgery 04/27/19 1131 04/28/19 0819   04/28/19 0400  cefUROXime (ZINACEF) 750 mg in sodium chloride 0.9 % 100 mL IVPB     750 mg 200 mL/hr over 30 Minutes Intravenous To Surgery 04/27/19 1131 04/28/19 1313   04/15/19 0900  vancomycin (VANCOREADY) IVPB 750 mg/150 mL     750 mg 150 mL/hr over 60 Minutes Intravenous Every 8 hours 04/15/19 0821     04/13/19 1400  vancomycin (VANCOCIN) IVPB 1000 mg/200 mL premix  Status:  Discontinued     1,000 mg 200 mL/hr over 60 Minutes Intravenous Every 8 hours 04/13/19 0839 04/15/19 0821   04/12/19 2200  vancomycin (VANCOREADY) IVPB 750 mg/150 mL  Status:  Discontinued     750 mg 150 mL/hr over 60 Minutes Intravenous Every 8 hours 04/12/19 1500 04/13/19 0839   04/10/19 1336  vancomycin (VANCOCIN) IVPB 1000 mg/200 mL premix  Status:   Discontinued     1,000 mg 200 mL/hr over 60 Minutes Intravenous Every 8 hours 04/10/19 1320 04/12/19 1500   04/08/19 1200  cefTRIAXone (ROCEPHIN) 2 g in sodium chloride 0.9 % 100 mL IVPB     2 g 200 mL/hr over 30 Minutes Intravenous Every 12 hours 04/08/19 1013 06/09/19 2359   04/08/19 0530  vancomycin (VANCOCIN) IVPB 1000 mg/200 mL premix  Status:  Discontinued     1,000 mg 200 mL/hr over 60 Minutes Intravenous Every 12 hours 04/07/19 1541 04/10/19 1320   04/07/19 1900  Ampicillin-Sulbactam (UNASYN) 3 g in sodium chloride 0.9 % 100 mL IVPB  Status:  Discontinued     3 g 200 mL/hr over 30 Minutes Intravenous Every 6 hours 04/07/19 1457 04/08/19 1013   04/07/19 1730  vancomycin (VANCOREADY) IVPB 1500 mg/300 mL     1,500 mg 150 mL/hr over 120 Minutes Intravenous  Once 04/07/19 1541 04/07/19 1937   04/04/19 1400  ceFAZolin (ANCEF) IVPB 2g/100 mL premix  Status:  Discontinued     2 g 200 mL/hr over 30 Minutes Intravenous Every 8 hours 04/04/19 1119 04/07/19 1457   04/04/19 0400  Vancomycin (VANCOCIN) 1,250 mg in sodium chloride 0.9 % 250 mL IVPB  Status:  Discontinued     1,250 mg 166.7 mL/hr over 90 Minutes Intravenous Every 12 hours  04/03/19 1635 04/04/19 1159   04/02/19 1800  ceFEPIme (MAXIPIME) 2 g in sodium chloride 0.9 % 100 mL IVPB  Status:  Discontinued     2 g 200 mL/hr over 30 Minutes Intravenous Every 8 hours 04/02/19 1218 04/04/19 1127   04/02/19 1600  vancomycin (VANCOCIN) IVPB 1000 mg/200 mL premix  Status:  Discontinued     1,000 mg 200 mL/hr over 60 Minutes Intravenous Every 12 hours 04/02/19 0100 04/03/19 1635   04/02/19 0200  piperacillin-tazobactam (ZOSYN) IVPB 3.375 g  Status:  Discontinued     3.375 g 12.5 mL/hr over 240 Minutes Intravenous Every 8 hours 04/02/19 0059 04/02/19 1219   04/02/19 0200  vancomycin (VANCOCIN) 1,500 mg in sodium chloride 0.9 % 500 mL IVPB     1,500 mg 250 mL/hr over 120 Minutes Intravenous  Once 04/02/19 0100 04/02/19 0923   04/01/19 2040   ceFAZolin (ANCEF) 2-4 GM/100ML-% IVPB    Note to Pharmacy: Channing Mutters   : cabinet override      04/01/19 2040 04/02/19 0844        Subjective: Patient seen and examined at bedside.  Denies worsening shortness of breath, chest pain, fever or vomiting.    Objective: Vitals:   05/16/19 1551 05/16/19 2053 05/17/19 0005 05/17/19 0349  BP: 96/70 126/86 94/62 94/66   Pulse: (!) 102 (!) 103 98 89  Resp: 18 18  17   Temp: 98 F (36.7 C) 97.9 F (36.6 C) 98.1 F (36.7 C) 98.2 F (36.8 C)  TempSrc: Oral Oral Oral Oral  SpO2: 99% 96% 95% 100%  Weight:    76.8 kg  Height:        Intake/Output Summary (Last 24 hours) at 05/17/2019 0735 Last data filed at 05/16/2019 2000 Gross per 24 hour  Intake 120 ml  Output --  Net 120 ml   Filed Weights   05/15/19 0500 05/16/19 0507 05/17/19 0349  Weight: 74.9 kg 75.8 kg 76.8 kg    Examination:  General exam: No acute distress Respiratory system: Bilateral decreased breath sounds at bases, no wheezing  cardiovascular system: Currently rate controlled, S1-S2 heard  gastrointestinal system: Abdomen is nondistended, soft and nontender.  Normal bowel sounds heard Extremities: No clubbing or edema  Data Reviewed: I have personally reviewed following labs and imaging studies  CBC: Recent Labs  Lab 05/11/19 0500  WBC 5.6  NEUTROABS 2.9  HGB 8.5*  HCT 27.8*  MCV 88.0  PLT 596*   Basic Metabolic Panel: Recent Labs  Lab 05/11/19 0500 05/13/19 0500 05/17/19 0504  NA 139 138 139  K 4.1 4.1 4.4  CL 102 106 107  CO2 26 25 24   GLUCOSE 86 90 94  BUN 17 15 18   CREATININE 0.61 0.67 0.59  CALCIUM 8.6* 8.7* 8.6*  MG 1.9  --   --   PHOS 4.5  --   --    GFR: Estimated Creatinine Clearance: 107.6 mL/min (by C-G formula based on SCr of 0.59 mg/dL). Liver Function Tests: No results for input(s): AST, ALT, ALKPHOS, BILITOT, PROT, ALBUMIN in the last 168 hours. No results for input(s): LIPASE, AMYLASE in the last 168 hours. No results for  input(s): AMMONIA in the last 168 hours. Coagulation Profile: No results for input(s): INR, PROTIME in the last 168 hours. Cardiac Enzymes: No results for input(s): CKTOTAL, CKMB, CKMBINDEX, TROPONINI in the last 168 hours. BNP (last 3 results) No results for input(s): PROBNP in the last 8760 hours. HbA1C: No results for input(s): HGBA1C in the  last 72 hours. CBG: Recent Labs  Lab 05/15/19 2101  GLUCAP 92   Lipid Profile: No results for input(s): CHOL, HDL, LDLCALC, TRIG, CHOLHDL, LDLDIRECT in the last 72 hours. Thyroid Function Tests: No results for input(s): TSH, T4TOTAL, FREET4, T3FREE, THYROIDAB in the last 72 hours. Anemia Panel: No results for input(s): VITAMINB12, FOLATE, FERRITIN, TIBC, IRON, RETICCTPCT in the last 72 hours. Sepsis Labs: No results for input(s): PROCALCITON, LATICACIDVEN in the last 168 hours.  No results found for this or any previous visit (from the past 240 hour(s)).       Radiology Studies: No results found.      Scheduled Meds: . aspirin EC  81 mg Oral Daily   Or  . aspirin  81 mg Per Tube Daily  . bisacodyl  10 mg Oral Daily   Or  . bisacodyl  10 mg Rectal Daily  . busPIRone  7.5 mg Oral BID  . Chlorhexidine Gluconate Cloth  6 each Topical Daily  . clopidogrel  75 mg Oral Daily  . [place] [facility], Patient & Family Education   Does not apply Once  . docusate sodium  200 mg Oral Daily  . escitalopram  10 mg Oral Daily  . feeding supplement (ENSURE ENLIVE)  237 mL Oral BID BM  . ferrous fumarate-b12-vitamic C-folic acid  1 capsule Oral BID PC  . furosemide  20 mg Oral Daily  . metoprolol tartrate  6.25 mg Oral BID  . pantoprazole  40 mg Oral Daily  . pneumococcal 23 valent vaccine  0.5 mL Intramuscular Tomorrow-1000  . potassium chloride  20 mEq Oral TID  . QUEtiapine  25 mg Oral QHS  . sodium chloride flush  3 mL Intravenous Q12H   Continuous Infusions: . sodium chloride    . sodium chloride    . cefTRIAXone  (ROCEPHIN)  IV 2 g (05/16/19 2150)  . lactated ringers Stopped (04/30/19 2141)  . vancomycin 750 mg (05/17/19 0001)          Glade Lloyd, MD [facility] 05/17/2019, 7:35 AM

## 2019-05-18 ENCOUNTER — Encounter (HOSPITAL_COMMUNITY): Payer: Self-pay | Admitting: Cardiothoracic Surgery

## 2019-05-18 NOTE — Progress Notes (Addendum)
      [address]       [place] [address]             [phone]      20 Days Post-Op Procedure(s) (LRB): AORTIC VALVE REPLACEMENT (AVR) using Edwards PERIMOUNT Magna Ease Pericardial Bioprosthesis - 29 MM Aortic Valve. (N/A) REPAIR OF TRICUSPID (N/A) TRANSESOPHAGEAL ECHOCARDIOGRAM (TEE) (N/A) Subjective: Feels okay this morning. States that she is at peace with her being here.   Objective: Vital signs in last 24 hours: Temp:  [97.7 F (36.5 C)-98.5 F (36.9 C)] 97.7 F (36.5 C) (05/10 0818) Pulse Rate:  [91-99] 98 (05/10 0818) Cardiac Rhythm: Normal sinus rhythm (05/10 0330) Resp:  [18-20] 18 (05/10 0818) BP: (97-107)/(64-79) 103/74 (05/10 0818) SpO2:  [100 %] 100 % (05/10 0818) Weight:  [77.2 kg] 77.2 kg (05/10 0300)     Intake/Output from previous day: 05/09 0701 - 05/10 0700 In: 120 [P.O.:120] Out: -  Intake/Output this shift: No intake/output data recorded.  General appearance: alert, cooperative and no distress Heart: regular rate and rhythm, S1, S2 normal, no murmur, click, rub or gallop Lungs: clear to auscultation bilaterally Abdomen: soft, non-tender; bowel sounds normal; no masses,  no organomegaly Extremities: extremities normal, atraumatic, no cyanosis or edema Wound: clean and dry, well healed  Lab Results: No results for input(s): WBC, HGB, HCT, PLT in the last 72 hours. BMET:  Recent Labs    05/17/19 0504  NA 139  K 4.4  CL 107  CO2 24  GLUCOSE 94  BUN 18  CREATININE 0.59  CALCIUM 8.6*    PT/INR: No results for input(s): LABPROT, INR in the last 72 hours. ABG    Component Value Date/Time   PHART 7.365 04/28/2019 2205   HCO3 24.5 04/28/2019 2205   TCO2 26 04/28/2019 2205   ACIDBASEDEF 1.0 04/28/2019 2205   O2SAT 97.0 04/28/2019 2205   CBG (last 3)  Recent Labs    05/15/19 2101  GLUCAP 92    Assessment/Plan: S/P Procedure(s) (LRB): AORTIC VALVE REPLACEMENT (AVR) using Edwards PERIMOUNT Magna Ease  Pericardial Bioprosthesis - 29 MM Aortic Valve. (N/A) REPAIR OF TRICUSPID (N/A) TRANSESOPHAGEAL ECHOCARDIOGRAM (TEE) (N/A)  1. CV- NSR in the 90s, BP stable 2. Pulm- no acute issues.  3. Renal-creatinine 0.59, electrolytes okay 4. Blood glucose has been stable, patient is tolerating a normal diet 5. Ambulating TID without issue.  6. Continue Rocephin and Vancomycin.  7. Insomnia-resolved  Plan: Continue medical care. Will need assistance at discharge with a plan for sobriety. Case management/social work have been consulted and are assisting.    LOS: 47 days    Lauren Reyes 05/18/2019  I have seen and examined Lauren Reyes and agree with the above assessment  and plan.  Delight Ovens MD [facility] [phone] Office [phone] 05/18/2019 2:19 PM

## 2019-05-18 NOTE — Progress Notes (Signed)
CARDIAC REHAB PHASE 1  Up in hall independently  MODE:  Ambulation: 1600 ft   POST:  Rate/Rhythm: 112 ST  BP:  Supine:   Sitting: 108/80  Standing:    SaO2: 99%RA 0835-0850 Pt up walking in hall and I continued walk with her. She is steady and no SOB noted. Very motivated to walk and go farther as she can tolerate. Vital signs stable. In good spirits.   Luetta Nutting, RN BSN  05/18/2019 9:25 AM

## 2019-05-18 NOTE — Progress Notes (Signed)
Patient ID: Lauren Reyes, female   DOB: November 10, 1990, 29 y.o.   MRN: [id number]  PROGRESS NOTE    Lauren Reyes  HYW:[id number] DOB: [birth year] DOA: 04/01/2019 PCP: Patient, No Pcp Per   Brief Narrative:  29 year old female with history of substance abuse with heroin, crystal meth and cocaine presented to the ER with aphasia and right-sided weakness and code stroke.  Admitted to neurology ICU.  Patient was intubated and underwent thrombectomy for left MCA M3 occlusion and recanalization.  This was complicated by dissection and pseudoaneurysm of the left ICA status post stent placement.  Extubated on April 04, 2019.  She was found to have right common femoral vein DVT along with right groin hematoma which is treated with IVC filter placement.  TEE showed infective endocarditis.  Status post bioprosthetic aortic valve replacement and tricuspid valve repair on 04/28/2019.  Currently on IV antibiotics till 06/08/2019.  Assessment & Plan:   1. 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 Severe AR. S/P aortic valve replacement with bioprosthetic valve and tricuspid repair Initially presented with code stroke, intubated and admitted to the neuro ICU. Tracheal aspirate grew MSSA. So far tissue culture with no growth to date. Remains on IV vancomycin and IV ceftriaxone. 6 weeks of antibiotics post surgery through Jun 08, 2019. On the path report staining looks like CMV in some areas.  Followed by ID. Patient will remain in a supervised setting for completion of the antibiotics. Repeat cultures 4/28 with no growth.  -CT surgery requested that patient be taken over by [facility] service.  Patient was  transferred to [facility] service on 05/14/2019. -Currently afebrile and hemodynamic stable.  Monitor labs intermittently.   2. Aortic valve endocarditis. SP aortic valve replacement 04/28/2019 and tricuspid repair Cardiothoracic surgery following. Clinically improving.  On room air.     -Continue metoprolol and Lasix  3. Acute CVAof the MCA territory. Embolic in nature. TPA was deferred as concern for septic emboli. S/P left common carotid arteriogram with complete revascularization of the occluded M3. Complicated by dissection and pseudoaneurysm of the left ICA S/P placement of 4.5 mm x 25 mm pipeline flow diverter stent. Initially was on aspirin and Brilinta. Brilinta was held for cardiothoracic surgery. Patient has been off Pix[place] but on aspirin for cardiothoracic surgery. Patient has intracranial stenting therefore will require dual antiplatelet therapy with aspirin and Plavix per neurology: Continue this for 3 to 6 months Patient will follow up with Dr. Estanislado Pandy and decide on exact regimen. Patient will also require follow-up with neurology.  4. Right common femoral DVT, Right groin hematoma, IVC filter placement April 03, 2019. During the hospitalization patient was found to have right common femoral DVT. Patient also was found to have large right groin hematoma post vascular intervention Decision was made to place IVC filter placement since the patient was felt not a candidate for therapeutic anticoagulation given her septic emboli, dissection as well as hematoma.  5. Acute blood loss anemia. Iron deficiency anemia. B12 deficiency. Folate deficiency. Continue supplementation. H&H relatively stable.  Monitor intermittently. No active bleeding identified. No GI bleed.  6. Polysubstance abuse. UDS positive for THC, amphetamine admission. Suspected IV drug use as well with needle marks. Currently on oral pain medication. Out of the window for any withdrawals.  7. Anxiety. Currently on BuSpar, Lexapro, Seroquel. May not require Seroquel at discharge. As needed Ativan.  8. Hepatitis C antibody positive. Negative viral load. HIV screen negative. RPR negative. Will require outpatient follow-up and therapy.  9. Pain  control. On as needed  oxycodone.  Suggested patient to use Tylenol and decrease dose of oxycodone. Tapering off now.   DVT prophylaxis: Heparin subcu  Code Status: Full code  Family Communication: None  Disposition Plan:  Status is: Inpatient  Remains inpatient appropriate because patient will need to be inpatient to complete IV antibiotic treatment till 06/08/2019.  Dispo: The patient is from: Home  Anticipated d/c is to: Home  Anticipated d/c date is: > 3 days  Patient currently is not medically stable to d/c.   Consultants:  CT surgery  Infectious disease  Procedures:  Aortic valve replacement and tricuspid valve repair on 4/20   Anti-infectives (From admission, onward)   Start     Dose/Rate Route Frequency Ordered Stop   05/03/19 1000  fluconazole (DIFLUCAN) tablet 150 mg     150 mg Oral  Once 05/03/19 0804 05/03/19 1243   04/28/19 0400  vancomycin (VANCOREADY) IVPB 1250 mg/250 mL  Status:  Discontinued     1,250 mg 166.7 mL/hr over 90 Minutes Intravenous To Surgery 04/27/19 1131 04/28/19 1451   04/28/19 0400  cefUROXime (ZINACEF) 1.5 g in sodium chloride 0.9 % 100 mL IVPB     1.5 g 200 mL/hr over 30 Minutes Intravenous To Surgery 04/27/19 1131 04/28/19 0819   04/28/19 0400  cefUROXime (ZINACEF) 750 mg in sodium chloride 0.9 % 100 mL IVPB     750 mg 200 mL/hr over 30 Minutes Intravenous To Surgery 04/27/19 1131 04/28/19 1313   04/15/19 0900  vancomycin (VANCOREADY) IVPB 750 mg/150 mL     750 mg 150 mL/hr over 60 Minutes Intravenous Every 8 hours 04/15/19 0821     04/13/19 1400  vancomycin (VANCOCIN) IVPB 1000 mg/200 mL premix  Status:  Discontinued     1,000 mg 200 mL/hr over 60 Minutes Intravenous Every 8 hours 04/13/19 0839 04/15/19 0821   04/12/19 2200  vancomycin (VANCOREADY) IVPB 750 mg/150 mL  Status:  Discontinued     750 mg 150 mL/hr over 60 Minutes Intravenous Every 8 hours 04/12/19 1500 04/13/19 0839   04/10/19 1336  vancomycin (VANCOCIN) IVPB 1000 mg/200 mL premix  Status:   Discontinued     1,000 mg 200 mL/hr over 60 Minutes Intravenous Every 8 hours 04/10/19 1320 04/12/19 1500   04/08/19 1200  cefTRIAXone (ROCEPHIN) 2 g in sodium chloride 0.9 % 100 mL IVPB     2 g 200 mL/hr over 30 Minutes Intravenous Every 12 hours 04/08/19 1013 06/09/19 2359   04/08/19 0530  vancomycin (VANCOCIN) IVPB 1000 mg/200 mL premix  Status:  Discontinued     1,000 mg 200 mL/hr over 60 Minutes Intravenous Every 12 hours 04/07/19 1541 04/10/19 1320   04/07/19 1900  Ampicillin-Sulbactam (UNASYN) 3 g in sodium chloride 0.9 % 100 mL IVPB  Status:  Discontinued     3 g 200 mL/hr over 30 Minutes Intravenous Every 6 hours 04/07/19 1457 04/08/19 1013   04/07/19 1730  vancomycin (VANCOREADY) IVPB 1500 mg/300 mL     1,500 mg 150 mL/hr over 120 Minutes Intravenous  Once 04/07/19 1541 04/07/19 1937   04/04/19 1400  ceFAZolin (ANCEF) IVPB 2g/100 mL premix  Status:  Discontinued     2 g 200 mL/hr over 30 Minutes Intravenous Every 8 hours 04/04/19 1119 04/07/19 1457   04/04/19 0400  Vancomycin (VANCOCIN) 1,250 mg in sodium chloride 0.9 % 250 mL IVPB  Status:  Discontinued     1,250 mg 166.7 mL/hr over 90 Minutes Intravenous Every 12 hours  04/03/19 1635 04/04/19 1159   04/02/19 1800  ceFEPIme (MAXIPIME) 2 g in sodium chloride 0.9 % 100 mL IVPB  Status:  Discontinued     2 g 200 mL/hr over 30 Minutes Intravenous Every 8 hours 04/02/19 1218 04/04/19 1127   04/02/19 1600  vancomycin (VANCOCIN) IVPB 1000 mg/200 mL premix  Status:  Discontinued     1,000 mg 200 mL/hr over 60 Minutes Intravenous Every 12 hours 04/02/19 0100 04/03/19 1635   04/02/19 0200  piperacillin-tazobactam (ZOSYN) IVPB 3.375 g  Status:  Discontinued     3.375 g 12.5 mL/hr over 240 Minutes Intravenous Every 8 hours 04/02/19 0059 04/02/19 1219   04/02/19 0200  vancomycin (VANCOCIN) 1,500 mg in sodium chloride 0.9 % 500 mL IVPB     1,500 mg 250 mL/hr over 120 Minutes Intravenous  Once 04/02/19 0100 04/02/19 0923   04/01/19 2040   ceFAZolin (ANCEF) 2-4 GM/100ML-% IVPB    Note to Pharmacy: Channing Mutters   : cabinet override      04/01/19 2040 04/02/19 0844        Subjective: Patient seen and examined at bedside.  She denies worsening abdominal pain, diarrhea, nausea, vomiting or fever or shortness of breath.  Objective: Vitals:   05/17/19 2005 05/18/19 0000 05/18/19 0300 05/18/19 0330  BP: 107/71 103/72  101/71  Pulse: 99 94  91  Resp:      Temp: 98.1 F (36.7 C) 98.5 F (36.9 C)  98.2 F (36.8 C)  TempSrc: Oral Oral  Oral  SpO2: 100% 100%  100%  Weight:   77.2 kg   Height:        Intake/Output Summary (Last 24 hours) at 05/18/2019 0743 Last data filed at 05/17/2019 2300 Gross per 24 hour  Intake 120 ml  Output --  Net 120 ml   Filed Weights   05/16/19 0507 05/17/19 0349 05/18/19 0300  Weight: 75.8 kg 76.8 kg 77.2 kg    Examination:  General exam: No distress. Respiratory system: Bilateral decreased breath sounds at bases with some scattered crackles  cardiovascular system: S1-S2 heard, rate controlled  gastrointestinal system: Abdomen is nondistended, soft and nontender.  Bowel sounds are heard Extremities: No clubbing or edema  Data Reviewed: I have personally reviewed following labs and imaging studies  CBC: No results for input(s): WBC, NEUTROABS, HGB, HCT, MCV, PLT in the last 168 hours. Basic Metabolic Panel: Recent Labs  Lab 05/13/19 0500 05/17/19 0504  NA 138 139  K 4.1 4.4  CL 106 107  CO2 25 24  GLUCOSE 90 94  BUN 15 18  CREATININE 0.67 0.59  CALCIUM 8.7* 8.6*   GFR: Estimated Creatinine Clearance: 107.9 mL/min (by C-G formula based on SCr of 0.59 mg/dL). Liver Function Tests: No results for input(s): AST, ALT, ALKPHOS, BILITOT, PROT, ALBUMIN in the last 168 hours. No results for input(s): LIPASE, AMYLASE in the last 168 hours. No results for input(s): AMMONIA in the last 168 hours. Coagulation Profile: No results for input(s): INR, PROTIME in the last 168  hours. Cardiac Enzymes: No results for input(s): CKTOTAL, CKMB, CKMBINDEX, TROPONINI in the last 168 hours. BNP (last 3 results) No results for input(s): PROBNP in the last 8760 hours. HbA1C: No results for input(s): HGBA1C in the last 72 hours. CBG: Recent Labs  Lab 05/15/19 2101  GLUCAP 92   Lipid Profile: No results for input(s): CHOL, HDL, LDLCALC, TRIG, CHOLHDL, LDLDIRECT in the last 72 hours. Thyroid Function Tests: No results for input(s): TSH, T4TOTAL, FREET4,  T3FREE, THYROIDAB in the last 72 hours. Anemia Panel: No results for input(s): VITAMINB12, FOLATE, FERRITIN, TIBC, IRON, RETICCTPCT in the last 72 hours. Sepsis Labs: No results for input(s): PROCALCITON, LATICACIDVEN in the last 168 hours.  No results found for this or any previous visit (from the past 240 hour(s)).       Radiology Studies: No results found.      Scheduled Meds: . aspirin EC  81 mg Oral Daily   Or  . aspirin  81 mg Per Tube Daily  . bisacodyl  10 mg Oral Daily   Or  . bisacodyl  10 mg Rectal Daily  . busPIRone  7.5 mg Oral BID  . Chlorhexidine Gluconate Cloth  6 each Topical Daily  . clopidogrel  75 mg Oral Daily  . [place] [facility], Patient & Family Education   Does not apply Once  . docusate sodium  200 mg Oral Daily  . escitalopram  10 mg Oral Daily  . feeding supplement (ENSURE ENLIVE)  237 mL Oral BID BM  . ferrous [id number] C-folic acid  1 capsule Oral BID PC  . furosemide  20 mg Oral Daily  . metoprolol tartrate  6.25 mg Oral BID  . pantoprazole  40 mg Oral Daily  . pneumococcal 23 valent vaccine  0.5 mL Intramuscular Tomorrow-1000  . potassium chloride  20 mEq Oral TID  . QUEtiapine  25 mg Oral QHS  . sodium chloride flush  3 mL Intravenous Q12H   Continuous Infusions: . sodium chloride    . sodium chloride    . cefTRIAXone (ROCEPHIN)  IV 2 g (05/17/19 2211)  . lactated ringers Stopped (04/30/19 2141)  . vancomycin 750 mg (05/17/19 2359)           Aline August, MD [facility] 05/18/2019, 7:43 AM

## 2019-05-18 NOTE — Progress Notes (Signed)
Mobility Specialist: Progress Note   05/18/19 1301  Mobility  Activity Ambulated in hall  Level of Assistance Independent  Assistive Device None  Distance Ambulated (ft) 1500 ft  Mobility Response Tolerated well  Mobility performed by Mobility specialist  $Mobility charge 1 Mobility   Pre-Mobility: 95 HR Post-Mobility:  106 HR  Development worker, international aid

## 2019-05-19 LAB — VANCOMYCIN, TROUGH: Vancomycin Tr: 22 ug/mL (ref 15–20)

## 2019-05-19 MED ORDER — VANCOMYCIN HCL IN DEXTROSE 1-5 GM/200ML-% IV SOLN
1000.0000 mg | Freq: Two times a day (BID) | INTRAVENOUS | Status: DC
  Administered 2019-05-20 – 2019-05-22 (×6): 1000 mg via INTRAVENOUS
  Filled 2019-05-19 (×7): qty 200

## 2019-05-19 NOTE — Progress Notes (Signed)
Patient ID: Lauren Reyes, female   DOB: 1990-04-04, 29 y.o.   MRN: [id number]  PROGRESS NOTE    Lauren Reyes  FWY:[id number] DOB: September 01, 1990 DOA: 04/01/2019 PCP: Patient, No Pcp Per   Brief Narrative:  29 year old female with history of substance abuse with heroin, crystal meth and cocaine presented to the ER with aphasia and right-sided weakness and code stroke.  Admitted to neurology ICU.  Patient was intubated and underwent thrombectomy for left MCA M3 occlusion and recanalization.  This was complicated by dissection and pseudoaneurysm of the left ICA status post stent placement.  Extubated on April 04, 2019.  She was found to have right common femoral vein DVT along with right groin hematoma which is treated with IVC filter placement.  TEE showed infective endocarditis.  Status post bioprosthetic aortic valve replacement and tricuspid valve repair on 04/28/2019.  Currently on IV antibiotics till 06/08/2019.  Assessment & Plan:   1. 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 Severe AR. S/P aortic valve replacement with bioprosthetic valve and tricuspid repair Initially presented with code stroke, intubated and admitted to the neuro ICU. Tracheal aspirate grew MSSA. So far tissue culture with no growth to date. Remains on IV vancomycin and IV ceftriaxone. 6 weeks of antibiotics post surgery through Jun 08, 2019. On the path report staining looks like CMV in some areas.  Followed by ID. Patient will remain in a supervised setting for completion of the antibiotics. Repeat cultures 4/28 with no growth.  -CT surgery requested that patient be taken over by [facility] service.  Patient was  transferred to [facility] service on 05/14/2019.  CT surgery following. -Afebrile and vitals are stable.  Monitor labs intermittently.   2. Aortic valve endocarditis. SP aortic valve replacement 04/28/2019 and tricuspid repair Cardiothoracic surgery following. Clinically improving.  On  room air.   -Continue metoprolol and Lasix  3. Acute CVAof the MCA territory. Embolic in nature. TPA was deferred as concern for septic emboli. S/P left common carotid arteriogram with complete revascularization of the occluded M3. Complicated by dissection and pseudoaneurysm of the left ICA S/P placement of 4.5 mm x 25 mm pipeline flow diverter stent. Initially was on aspirin and Brilinta. Brilinta was held for cardiothoracic surgery. Patient has been off Brilinta but on aspirin for cardiothoracic surgery. Patient has intracranial stenting therefore will require dual antiplatelet therapy with aspirin and Plavix per neurology: Continue this for 3 to 6 months Patient will follow up with Dr. Corliss Skains and decide on exact regimen. Patient will also require follow-up with neurology.  4. Right common femoral DVT, Right groin hematoma, IVC filter placement April 03, 2019. During the hospitalization patient was found to have right common femoral DVT. Patient also was found to have large right groin hematoma post vascular intervention Decision was made to place IVC filter placement since the patient was felt not a candidate for therapeutic anticoagulation given her septic emboli, dissection as well as hematoma.  5. Acute blood loss anemia. Iron deficiency anemia. B12 deficiency. Folate deficiency. Continue supplementation. H&H relatively stable.  Monitor intermittently. No active bleeding identified. No GI bleed.  6. Polysubstance abuse. UDS positive for THC, amphetamine admission. Suspected IV drug use as well with needle marks. Currently on oral pain medication. Out of the window for any withdrawals.  7. Anxiety. Currently on BuSpar, Lexapro, Seroquel. May not require Seroquel at discharge. As needed Ativan.  8. Hepatitis C antibody positive. Negative viral load. HIV screen negative. RPR negative. Will require outpatient follow-up and therapy.  9. Pain control. On  as needed oxycodone.  Suggested patient to use Tylenol and decrease dose of oxycodone. Tapering off now.   DVT prophylaxis: Heparin subcu  Code Status: Full code  Family Communication: None  Disposition Plan:  Status is: Inpatient  Remains inpatient appropriate because patient will need to be inpatient to complete IV antibiotic treatment till 06/08/2019.  Dispo: The patient is from: Home  Anticipated d/c is to: Home  Anticipated d/c date is: > 3 days  Patient currently is not medically stable to d/c.   Consultants:  CT surgery  Infectious disease  Procedures:  Aortic valve replacement and tricuspid valve repair on 4/20   Anti-infectives (From admission, onward)   Start     Dose/Rate Route Frequency Ordered Stop   05/03/19 1000  fluconazole (DIFLUCAN) tablet 150 mg     150 mg Oral  Once 05/03/19 0804 05/03/19 1243   04/28/19 0400  vancomycin (VANCOREADY) IVPB 1250 mg/250 mL  Status:  Discontinued     1,250 mg 166.7 mL/hr over 90 Minutes Intravenous To Surgery 04/27/19 1131 04/28/19 1451   04/28/19 0400  cefUROXime (ZINACEF) 1.5 g in sodium chloride 0.9 % 100 mL IVPB     1.5 g 200 mL/hr over 30 Minutes Intravenous To Surgery 04/27/19 1131 04/28/19 0819   04/28/19 0400  cefUROXime (ZINACEF) 750 mg in sodium chloride 0.9 % 100 mL IVPB     750 mg 200 mL/hr over 30 Minutes Intravenous To Surgery 04/27/19 1131 04/28/19 1313   04/15/19 0900  vancomycin (VANCOREADY) IVPB 750 mg/150 mL     750 mg 150 mL/hr over 60 Minutes Intravenous Every 8 hours 04/15/19 0821     04/13/19 1400  vancomycin (VANCOCIN) IVPB 1000 mg/200 mL premix  Status:  Discontinued     1,000 mg 200 mL/hr over 60 Minutes Intravenous Every 8 hours 04/13/19 0839 04/15/19 0821   04/12/19 2200  vancomycin (VANCOREADY) IVPB 750 mg/150 mL  Status:  Discontinued     750 mg 150 mL/hr over 60 Minutes Intravenous Every 8 hours 04/12/19 1500 04/13/19 0839   04/10/19 1336  vancomycin (VANCOCIN) IVPB 1000 mg/200 mL premix   Status:  Discontinued     1,000 mg 200 mL/hr over 60 Minutes Intravenous Every 8 hours 04/10/19 1320 04/12/19 1500   04/08/19 1200  cefTRIAXone (ROCEPHIN) 2 g in sodium chloride 0.9 % 100 mL IVPB     2 g 200 mL/hr over 30 Minutes Intravenous Every 12 hours 04/08/19 1013 06/09/19 2359   04/08/19 0530  vancomycin (VANCOCIN) IVPB 1000 mg/200 mL premix  Status:  Discontinued     1,000 mg 200 mL/hr over 60 Minutes Intravenous Every 12 hours 04/07/19 1541 04/10/19 1320   04/07/19 1900  Ampicillin-Sulbactam (UNASYN) 3 g in sodium chloride 0.9 % 100 mL IVPB  Status:  Discontinued     3 g 200 mL/hr over 30 Minutes Intravenous Every 6 hours 04/07/19 1457 04/08/19 1013   04/07/19 1730  vancomycin (VANCOREADY) IVPB 1500 mg/300 mL     1,500 mg 150 mL/hr over 120 Minutes Intravenous  Once 04/07/19 1541 04/07/19 1937   04/04/19 1400  ceFAZolin (ANCEF) IVPB 2g/100 mL premix  Status:  Discontinued     2 g 200 mL/hr over 30 Minutes Intravenous Every 8 hours 04/04/19 1119 04/07/19 1457   04/04/19 0400  Vancomycin (VANCOCIN) 1,250 mg in sodium chloride 0.9 % 250 mL IVPB  Status:  Discontinued     1,250 mg 166.7 mL/hr over 90 Minutes Intravenous Every  12 hours 04/03/19 1635 04/04/19 1159   04/02/19 1800  ceFEPIme (MAXIPIME) 2 g in sodium chloride 0.9 % 100 mL IVPB  Status:  Discontinued     2 g 200 mL/hr over 30 Minutes Intravenous Every 8 hours 04/02/19 1218 04/04/19 1127   04/02/19 1600  vancomycin (VANCOCIN) IVPB 1000 mg/200 mL premix  Status:  Discontinued     1,000 mg 200 mL/hr over 60 Minutes Intravenous Every 12 hours 04/02/19 0100 04/03/19 1635   04/02/19 0200  piperacillin-tazobactam (ZOSYN) IVPB 3.375 g  Status:  Discontinued     3.375 g 12.5 mL/hr over 240 Minutes Intravenous Every 8 hours 04/02/19 0059 04/02/19 1219   04/02/19 0200  vancomycin (VANCOCIN) 1,500 mg in sodium chloride 0.9 % 500 mL IVPB     1,500 mg 250 mL/hr over 120 Minutes Intravenous  Once 04/02/19 0100 04/02/19 0923    04/01/19 2040  ceFAZolin (ANCEF) 2-4 GM/100ML-% IVPB    Note to Pharmacy: Channing Mutters   : cabinet override      04/01/19 2040 04/02/19 0844        Subjective: Patient seen and examined at bedside.  Denies fever, worsening shortness of breath or chest pain.  Objective: Vitals:   05/18/19 1725 05/18/19 2122 05/18/19 2332 05/19/19 0438  BP: 108/70 94/72 109/81 96/67  Pulse: (!) 103 92 98 (!) 103  Resp:  19 19 19   Temp: 97.9 F (36.6 C) 98.6 F (37 C) 97.7 F (36.5 C) 98 F (36.7 C)  TempSrc: Oral Oral Oral Oral  SpO2: 96% 100% 100% 100%  Weight:    78.7 kg  Height:        Intake/Output Summary (Last 24 hours) at 05/19/2019 0730 Last data filed at 05/18/2019 2344 Gross per 24 hour  Intake 2050 ml  Output -  Net 2050 ml   Filed Weights   05/17/19 0349 05/18/19 0300 05/19/19 0438  Weight: 76.8 kg 77.2 kg 78.7 kg    Examination:  General exam: No acute distress Respiratory system: Bilateral decreased breath sounds at bases, no wheezing  cardiovascular system: Rate controlled, S1-S2 heard  gastrointestinal system: Abdomen is nondistended, soft and nontender.  Bowel sounds are heard Extremities: No edema or cyanosis  Data Reviewed: I have personally reviewed following labs and imaging studies  CBC: No results for input(s): WBC, NEUTROABS, HGB, HCT, MCV, PLT in the last 168 hours. Basic Metabolic Panel: Recent Labs  Lab 05/13/19 0500 05/17/19 0504  NA 138 139  K 4.1 4.4  CL 106 107  CO2 25 24  GLUCOSE 90 94  BUN 15 18  CREATININE 0.67 0.59  CALCIUM 8.7* 8.6*   GFR: Estimated Creatinine Clearance: 108.9 mL/min (by C-G formula based on SCr of 0.59 mg/dL). Liver Function Tests: No results for input(s): AST, ALT, ALKPHOS, BILITOT, PROT, ALBUMIN in the last 168 hours. No results for input(s): LIPASE, AMYLASE in the last 168 hours. No results for input(s): AMMONIA in the last 168 hours. Coagulation Profile: No results for input(s): INR, PROTIME in the last 168  hours. Cardiac Enzymes: No results for input(s): CKTOTAL, CKMB, CKMBINDEX, TROPONINI in the last 168 hours. BNP (last 3 results) No results for input(s): PROBNP in the last 8760 hours. HbA1C: No results for input(s): HGBA1C in the last 72 hours. CBG: Recent Labs  Lab 05/15/19 2101  GLUCAP 92   Lipid Profile: No results for input(s): CHOL, HDL, LDLCALC, TRIG, CHOLHDL, LDLDIRECT in the last 72 hours. Thyroid Function Tests: No results for input(s): TSH, T4TOTAL,  FREET4, T3FREE, THYROIDAB in the last 72 hours. Anemia Panel: No results for input(s): VITAMINB12, FOLATE, FERRITIN, TIBC, IRON, RETICCTPCT in the last 72 hours. Sepsis Labs: No results for input(s): PROCALCITON, LATICACIDVEN in the last 168 hours.  No results found for this or any previous visit (from the past 240 hour(s)).       Radiology Studies: No results found.      Scheduled Meds: . aspirin EC  81 mg Oral Daily   Or  . aspirin  81 mg Per Tube Daily  . bisacodyl  10 mg Oral Daily   Or  . bisacodyl  10 mg Rectal Daily  . busPIRone  7.5 mg Oral BID  . Chlorhexidine Gluconate Cloth  6 each Topical Daily  . clopidogrel  75 mg Oral Daily  . [place] [facility], Patient & Family Education   Does not apply Once  . docusate sodium  200 mg Oral Daily  . escitalopram  10 mg Oral Daily  . feeding supplement (ENSURE ENLIVE)  237 mL Oral BID BM  . ferrous fumarate-b12-vitamic C-folic acid  1 capsule Oral BID PC  . furosemide  20 mg Oral Daily  . metoprolol tartrate  6.25 mg Oral BID  . pantoprazole  40 mg Oral Daily  . pneumococcal 23 valent vaccine  0.5 mL Intramuscular Tomorrow-1000  . potassium chloride  20 mEq Oral TID  . QUEtiapine  25 mg Oral QHS  . sodium chloride flush  3 mL Intravenous Q12H   Continuous Infusions: . sodium chloride    . sodium chloride    . cefTRIAXone (ROCEPHIN)  IV 2 g (05/18/19 2253)  . lactated ringers Stopped (04/30/19 2141)  . vancomycin 750 mg (05/18/19 2344)           Glade Lloyd, MD [facility] 05/19/2019, 7:30 AM

## 2019-05-19 NOTE — Progress Notes (Signed)
Nutrition Follow-up  DOCUMENTATION CODES:   Not applicable  INTERVENTION:   -Continue Ensure Enlive po BID, each supplement provides 350 kcal and 20 grams of protein -Continue MVI with minerals daily  NUTRITION DIAGNOSIS:   Increased nutrient needs related to acute illness as evidenced by estimated needs.  Ongoing  GOAL:   Patient will meet greater than or equal to 90% of their needs  Progressing   MONITOR:   I & O's, Labs, Supplement acceptance, PO intake, Weight trends  REASON FOR ASSESSMENT:   Ventilator    ASSESSMENT:   Pt with suspected IV drug abuse who while being interviewed by police developed left-sided weakness admitted with L MCA s/p IR for revascularization and L ICA stent.  3/24 - OG tube placed, intubated 3/27 - OG removed, extubated 3/29 - Regular diet ordered 3/30 - s/p TEE 4/20 - endocaditis with AV replacement, tricuspid valve repair, TEE  Appetite stable. Meal completions charted as 50-100% for her last 6 meals. Drinking Ensure 1-2 times daily. Antibiotic treatment complete 5/31.   Admission weight: 72.3 kg  Current weight: 78.7 kg   Medications: dulcolax, colace, 20 mEq KCl TID Labs: reviewed   Diet Order:   Diet Order            Diet 2 gram sodium Room service appropriate? Yes; Fluid consistency: Thin  Diet effective now              EDUCATION NEEDS:   No education needs have been identified at this time  Skin:  Skin Assessment: Skin Integrity Issues: Skin Integrity Issues:: Incisions, Other (Comment) Incisions: chest Other: MASD- intergluteal cleft  Last BM:  5/10  Height:   Ht Readings from Last 1 Encounters:  05/01/19 5\' 6"  (1.676 m)    Weight:   Wt Readings from Last 1 Encounters:  05/19/19 78.7 kg    Ideal Body Weight:  56.8 kg  BMI:  Body mass index is 28 kg/m.  Estimated Nutritional Needs:   Kcal:  1900-2100  Protein:  100-115 grams  Fluid:  > 1.9 L/day   07/19/19 RD, LDN Clinical  Nutrition Pager listed in AMION

## 2019-05-19 NOTE — Progress Notes (Addendum)
      [address]       [place] [address]             [phone]      21 Days Post-Op Procedure(s) (LRB): AORTIC VALVE REPLACEMENT (AVR) using Edwards PERIMOUNT Magna Ease Pericardial Bioprosthesis - 29 MM Aortic Valve. (N/A) REPAIR OF TRICUSPID (N/A) TRANSESOPHAGEAL ECHOCARDIOGRAM (TEE) (N/A) Subjective: Feels okay today. No issues.   Objective: Vital signs in last 24 hours: Temp:  [97.6 F (36.4 C)-98.6 F (37 C)] 98 F (36.7 C) (05/11 0438) Pulse Rate:  [86-103] 103 (05/11 0438) Cardiac Rhythm: Sinus tachycardia (05/10 1900) Resp:  [18-19] 19 (05/11 0438) BP: (94-109)/(66-81) 96/67 (05/11 0438) SpO2:  [96 %-100 %] 100 % (05/11 0438) Weight:  [78.7 kg] 78.7 kg (05/11 0438)     Intake/Output from previous day: 05/10 0701 - 05/11 0700 In: 2050 [IV Piggyback:2050] Out: -  Intake/Output this shift: No intake/output data recorded.  General appearance: alert, cooperative and no distress Heart: regular rate and rhythm, S1, S2 normal, no murmur, click, rub or gallop Lungs: clear to auscultation bilaterally Abdomen: soft, non-tender; bowel sounds normal; no masses,  no organomegaly Extremities: extremities normal, atraumatic, no cyanosis or edema Wound: clean and dry  Lab Results: No results for input(s): WBC, HGB, HCT, PLT in the last 72 hours. BMET:  Recent Labs    05/17/19 0504  NA 139  K 4.4  CL 107  CO2 24  GLUCOSE 94  BUN 18  CREATININE 0.59  CALCIUM 8.6*    PT/INR: No results for input(s): LABPROT, INR in the last 72 hours. ABG    Component Value Date/Time   PHART 7.365 04/28/2019 2205   HCO3 24.5 04/28/2019 2205   TCO2 26 04/28/2019 2205   ACIDBASEDEF 1.0 04/28/2019 2205   O2SAT 97.0 04/28/2019 2205   CBG (last 3)  No results for input(s): GLUCAP in the last 72 hours.  Assessment/Plan: S/P Procedure(s) (LRB): AORTIC VALVE REPLACEMENT (AVR) using Edwards PERIMOUNT Magna Ease Pericardial Bioprosthesis - 29 MM Aortic Valve.  (N/A) REPAIR OF TRICUSPID (N/A) TRANSESOPHAGEAL ECHOCARDIOGRAM (TEE) (N/A)  1. CV- NSR in the 90s, BP stable 2. Pulm- no acute issues. Last CXR stable 3. Renal-creatinine 0.59, electrolytes okay 4. Blood glucose has been stable, patient is tolerating a normal diet 5. Ambulating TID without issue.  6. Continue IV Rocephin and Vancomycin.  7. Insomnia-resolved  Plan: Continue medical care, continue to ambulate TID and use incentive spirometer. Consult placed to social work for    LOS: 48 days    [name] 05/19/2019  Medical service placed caval filter - will need plan for removal at some point  I have seen and examined Tandrea Wanjiku Gores and agree with the above assessment  and plan.  Delight Ovens MD Beeper [phone] Office [phone] 05/19/2019 12:18 PM

## 2019-05-19 NOTE — Progress Notes (Signed)
CARDIAC REHAB PHASE I   PRE:  Rate/Rhythm: 93 SR  BP:  Supine:   Sitting: 99/67  Standing:    SaO2: 99%RA  MODE:  Ambulation: 1300 ft   POST:  Rate/Rhythm: 107 ST  BP:  Supine:   Sitting: 97/76  Standing:    SaO2: 99%RA 1310-1338 Pt walked 1300 ft on RA with steady gait and tolerated well. Second walk today.    Luetta Nutting, RN BSN  05/19/2019 1:34 PM

## 2019-05-19 NOTE — Progress Notes (Signed)
Pharmacy Antibiotic Note Lauren Reyes is a 29 y.o. female with endocarditis involving aortic, mitral, and tricuspid valves with severe aortic regurgitation. Patient's condition is further complicated by septic emboli to her brain, lungs, and spleen. Respiratory culture grew MSSA, however, recent blood cultures are negative. Aortic valve tissue culture showing gram stain of rare gram positive cocci. She is s/p AVR/TVRepair on 4/20. Vancomycin doses are being adjusted on troughs alone due to concern of cerebritis.   Vancomycin trough this AM = 22; collected at appropriate time with last administration on 750 mg IV every 8 hours currently.  Last SCr 5/9 stable.    Plan: Adjust vancomycin to 1000 mg IV every 12 hours VT at steady state Continue ceftriaxone IV 2g q12 per ID Monitor renal function, clinical status End date for antibiotics: 06/08/19  Height: 5\' 6"  (167.6 cm) Weight: 78.7 kg (173 lb 8 oz) IBW/kg (Calculated) : 59.3  Temp (24hrs), Avg:98 F (36.7 C), Min:97.6 F (36.4 C), Max:98.6 F (37 C)  Recent Labs  Lab 05/13/19 0500 05/17/19 0504 05/19/19 0736  CREATININE 0.67 0.59  --   VANCOTROUGH 21*  --  22*    Estimated Creatinine Clearance: 108.9 mL/min (by C-G formula based on SCr of 0.59 mg/dL).    No Known Allergies  Antimicrobials this admission: Zosyn 3/25 >> 3/25 Cefepime 3/25 >> 3/27 Ancef 3/27 >> 3/30 Vanc 3/25 >> 3/27; restarted 3/30 >> (5/31) Unasyn 3/30>>3/31 Ceftriaxone 3/31>> (5/31) Flucon 4/25 Clotrimazole cream 4/25> 5/1   4/28 VT 16  4/21 VT 16 4/16 VT 17  4/10 VT (drawn 6hr after dose) 16  4/7 VT 23 4/10 VT 16 >> true trough likely 15 mcg/ml d/t late draw and timing of doses in past 24h >> cont 750 mg/8h 4/17 VT 17 mcg/ml >> cont 750 mg/8h  4/21 VT 16 >> cont same 5/5 Vanc 6hr level= 21 >> cont same 5/11 VT: 22  Microbiology results: 3/24 COVID/Flu >> neg 3/25 RCx >> moderate MSSA 3/25 MRSA PCR >> negative 3/25 BCx >> negative   4/16 MRSA PCR >> neg 4/20 MRSA PCR >> neg 4/20 Tricuspid valve specimen: neg 4/20 pericardial fluid: neg 4/20 aortic valve leaflets: 1 with rare gram positive cocci   Thank you for allowing pharmacy to be a part of this patient's care.  5/20, PharmD Clinical Pharmacist Please check AMION for all [facility] Pharmacy numbers 05/19/2019 10:25 AM

## 2019-05-20 LAB — BASIC METABOLIC PANEL
Anion gap: 6 (ref 5–15)
BUN: 15 mg/dL (ref 6–20)
CO2: 25 mmol/L (ref 22–32)
Calcium: 8.6 mg/dL — ABNORMAL LOW (ref 8.9–10.3)
Chloride: 107 mmol/L (ref 98–111)
Creatinine, Ser: 0.62 mg/dL (ref 0.44–1.00)
GFR calc Af Amer: >60 mL/min (ref >60)
GFR calc non Af Amer: >60 mL/min (ref >60)
Glucose, Bld: 83 mg/dL (ref 70–99)
Potassium: 4.3 mmol/L (ref 3.5–5.1)
Sodium: 138 mmol/L (ref 135–145)

## 2019-05-20 LAB — MAGNESIUM: Magnesium: 1.8 mg/dL (ref 1.7–2.4)

## 2019-05-20 LAB — CBC WITH DIFFERENTIAL/PLATELET
Abs Immature Granulocytes: 0.01 10*3/uL (ref 0.00–0.07)
Basophils Absolute: 0 10*3/uL (ref 0.0–0.1)
Basophils Relative: 1 %
Eosinophils Absolute: 0.3 10*3/uL (ref 0.0–0.5)
Eosinophils Relative: 8 %
HCT: 29 % — ABNORMAL LOW (ref 36.0–46.0)
Hemoglobin: 8.7 g/dL — ABNORMAL LOW (ref 12.0–15.0)
Immature Granulocytes: 0 %
Lymphocytes Relative: 50 %
Lymphs Abs: 2 10*3/uL (ref 0.7–4.0)
MCH: 26.5 pg (ref 26.0–34.0)
MCHC: 30 g/dL (ref 30.0–36.0)
MCV: 88.4 fL (ref 80.0–100.0)
Monocytes Absolute: 0.3 10*3/uL (ref 0.1–1.0)
Monocytes Relative: 7 %
Neutro Abs: 1.3 10*3/uL — ABNORMAL LOW (ref 1.7–7.7)
Neutrophils Relative %: 34 %
Platelets: 429 10*3/uL — ABNORMAL HIGH (ref 150–400)
RBC: 3.28 MIL/uL — ABNORMAL LOW (ref 3.87–5.11)
RDW: 17 % — ABNORMAL HIGH (ref 11.5–15.5)
WBC: 3.9 10*3/uL — ABNORMAL LOW (ref 4.0–10.5)
nRBC: 0 % (ref 0.0–0.2)

## 2019-05-20 NOTE — Progress Notes (Signed)
CARDIAC REHAB PHASE I   Pt seen ambulating in hallway independently with steady gait. HR 107 ST.  Reynold Bowen, RN BSN 05/20/2019 8:51 AM

## 2019-05-20 NOTE — Progress Notes (Signed)
Mobility Specialist - Progress Note   05/20/19 1335  Therapy Vitals  Pulse Rate (!) 101  BP 97/67  Mobility  Activity Ambulated in hall  Level of Assistance Independent  Assistive Device None  Distance Ambulated (ft) 2100 ft  Mobility Response Tolerated well  Mobility performed by Mobility specialist  Transport method Ambulatory  $Mobility charge 1 Mobility    Pre-mobility:101 HR, 97/67 BP Post-mobility: 110 HR, 109/77 BP  I left pt in bed after ambulation, her nurse was present to change pt's dressing. Pt was asx during her walk.  Mamie Levers Mobility Specialist

## 2019-05-20 NOTE — Progress Notes (Signed)
1044 Pt sleeping now . Walked earlier today. Will continue to follow. Luetta Nutting RN BSN 05/20/2019 10:50 AM

## 2019-05-20 NOTE — Progress Notes (Signed)
Noted referral regarding patients concern over upcoming court date- CM stopped by patients room to speak with her regarding her concerns. Per conversation with pt she states she just needs a letter stating that she is in the hospital so that she can give it to her mom to send into the court. Will ask MD to provide letter needed so that patient can take care of what she needs- also discussed with pt that TOC will continue to follow and see her again closer to discharge for transition needs. Pt voiced understanding, advised pt that should she need something before then to let floor staff know to contact CM/CSW and we would come see her prior to that. TOC to continue to follow.

## 2019-05-20 NOTE — Progress Notes (Signed)
Patient ambulated in hallway independently this AM. willl monitor patient. Khamya Topp, [employer] rn

## 2019-05-20 NOTE — Progress Notes (Addendum)
      [address]       [place] [address]             [phone]      22 Days Post-Op Procedure(s) (LRB): AORTIC VALVE REPLACEMENT (AVR) using Edwards PERIMOUNT Magna Ease Pericardial Bioprosthesis - 29 MM Aortic Valve. (N/A) REPAIR OF TRICUSPID (N/A) TRANSESOPHAGEAL ECHOCARDIOGRAM (TEE) (N/A) Subjective: Feels okay this morning. About to go for a walk.   Objective: Vital signs in last 24 hours: Temp:  [97.6 F (36.4 C)-98.6 F (37 C)] 98.6 F (37 C) (05/12 0726) Pulse Rate:  [90-97] 90 (05/12 0726) Cardiac Rhythm: Normal sinus rhythm (05/11 1906) Resp:  [17-20] 17 (05/12 0726) BP: (94-118)/(64-106) 94/66 (05/12 0726) SpO2:  [99 %-100 %] 100 % (05/12 0726) Weight:  [79.3 kg] 79.3 kg (05/12 0433)     Intake/Output from previous day: 05/11 0701 - 05/12 0700 In: 865.7 [P.O.:240; IV Piggyback:625.7] Out: -  Intake/Output this shift: No intake/output data recorded.  General appearance: alert, cooperative and no distress Heart: regular rate and rhythm, S1, S2 normal, no murmur, click, rub or gallop Lungs: clear to auscultation bilaterally Abdomen: soft, non-tender; bowel sounds normal; no masses,  no organomegaly Extremities: extremities normal, atraumatic, no cyanosis or edema Wound: clean and dry  Lab Results: Recent Labs    05/20/19 0504  WBC 3.9*  HGB 8.7*  HCT 29.0*  PLT 429*   BMET:  Recent Labs    05/20/19 0504  NA 138  K 4.3  CL 107  CO2 25  GLUCOSE 83  BUN 15  CREATININE 0.62  CALCIUM 8.6*    PT/INR: No results for input(s): LABPROT, INR in the last 72 hours. ABG    Component Value Date/Time   PHART 7.365 04/28/2019 2205   HCO3 24.5 04/28/2019 2205   TCO2 26 04/28/2019 2205   ACIDBASEDEF 1.0 04/28/2019 2205   O2SAT 97.0 04/28/2019 2205   CBG (last 3)  No results for input(s): GLUCAP in the last 72 hours.  Assessment/Plan: S/P Procedure(s) (LRB): AORTIC VALVE REPLACEMENT (AVR) using Edwards PERIMOUNT Magna Ease  Pericardial Bioprosthesis - 29 MM Aortic Valve. (N/A) REPAIR OF TRICUSPID (N/A) TRANSESOPHAGEAL ECHOCARDIOGRAM (TEE) (N/A)   1. CV- NSR in the 90s, BP stable 2. Pulm- no acute issues. Last CXR stable 3. Renal-creatinine 0.62, electrolytes okay 4. Blood glucose has been stable, patient is tolerating a normal diet 5. H and H trending up.  6. Continue IV Rocephin and Vancomycin.  7. Insomnia-resolved, sleeping well.   Plan: Patient about to go for a walk this morning. No surgical issues. TOC team yet to see regarding yesterday's consult. Otherwise, Lauren Reyes is recovering well.    LOS: 49 days    [name] 05/20/2019 I have seen and examined Lauren Reyes and agree with the above assessment  and plan.  Delight Ovens MD Beeper [phone] Office [phone] 05/20/2019 9:12 AM

## 2019-05-20 NOTE — Progress Notes (Signed)
PROGRESS NOTE    Lauren Reyes    Code Status: Full Code  FYB:[id number] DOB: 25-Apr-1990 DOA: 04/01/2019 LOS: 49 days  PCP: Patient, No Pcp Per CC:  Chief Complaint  Patient presents with  . Code Stroke       Hospital Summary   This is a 29 year old female with past medical history of substance abuse with heroin, crystal meth and cocaine who presented to the ER with aphasia and right-sided weakness and had a code stroke subsequently called.  She was admitted to the neurology ICU on 04/01/2019.  She was intubated and underwent thrombectomy for left MCA M3 occlusion and recanalization.  This was unfortunately complicated by dissection and pseudoaneurysm of the left ICA, s/p stent placement.  She was extubated on 04/04/2019.  She was found to have a right common femoral vein DVT along with right groin hematoma which was treated with IVC filter placement.  Patient had a TEE which showed infective endocarditis and CT surgery was consulted.  She is status post bioprosthetic aortic valve replacement and tricuspid valve repair on 04/28/2019 and is on IV antibiotics. CT surgery on board, requested TRH takeover service on 5/6.    A & P   Principal Problem:   Endocarditis Active Problems:   Acute ischemic left MCA stroke (HCC)   Middle cerebral artery embolism, left   DVT (deep venous thrombosis) (HCC)   Encounter for central line placement   Endotracheal tube present   Cerebrovascular accident (CVA) due to embolism of precerebral artery (HCC)   Infective endocarditis   Normocytic anemia   Polysubstance abuse (HCC)   IVDU (intravenous drug user)   Tachypnea   1. Culture negative aortic and tricuspid valve endocarditis s/p AV replacement and TV repair 05/04/2019 with Dr. Tyrone Sage a. Afebrile, hemodynamically stable on room air without leukocytosis b. Currently on IV Rocephin and vancomycin.   c. will need to remain hospitalized until completion of 6 weeks of therapy on 06/08/2019 due to  drug use d. CT surgery on board e. continue metoprolol and Lasix  2. Acute left-sided MCA embolic stroke s/p IR revascularization (3/24) a. Complicated by dissection and pseudoaneurysm of left ICA s/p placement of stent b. Initially on aspirin/Brilinta however Brilinta held for CT surgery c. Now on aspirin and Plavix dual antiplatelet for total 3 to 6 months d. We will follow up with Dr. Corliss Skains to decide on exact regimen and require outpatient neurology follow up  3. Right common femoral DVT with right groin hematoma s/p IVC filter placement 04/03/2019 a. Not a candidate for anticoagulation given septic emboli, dissection and hematoma as well as dual antiplatelets b. Revisit IVC filter removal plan with IR prior to discharge  4. Acute blood loss anemia, iron deficiency anemia, B12 deficiency and folate deficiency, stable a. Continue supplementation  5. Polysubstance abuse: THC, amphetamine and suspected IV drug use a. Currently stable without any signs of withdrawal b. Continue to encourage illicit drug abstinence at discharge  6. Anxiety, stable on BuSpar, Lexapro and Seroquel  7. Pain control a. Continue tapering oxycodone   DVT prophylaxis: Heparin Family Communication: Patient updated at bedside Disposition Plan:  Status is: Inpatient  Remains inpatient appropriate because:Unsafe discharge plan   Dispo: The patient is from: Home              Anticipated d/c is to: Home              Anticipated d/c date is: > 3 days  Patient currently is not medically stable to d/c.           Pressure injury documentation    None  Consultants  CT surgery Neurology  Infectious disease  Procedures  IR guided left MCA thrombectomy on 3/24 AV replacement and TV repair on 4/20   Antibiotics   Anti-infectives (From admission, onward)   Start     Dose/Rate Route Frequency Ordered Stop   05/19/19 2100  vancomycin (VANCOCIN) IVPB 1000 mg/200 mL premix      1,000 mg 200 mL/hr over 60 Minutes Intravenous Every 12 hours 05/19/19 1040     05/03/19 1000  fluconazole (DIFLUCAN) tablet 150 mg     150 mg Oral  Once 05/03/19 0804 05/03/19 1243   04/28/19 0400  vancomycin (VANCOREADY) IVPB 1250 mg/250 mL  Status:  Discontinued     1,250 mg 166.7 mL/hr over 90 Minutes Intravenous To Surgery 04/27/19 1131 04/28/19 1451   04/28/19 0400  cefUROXime (ZINACEF) 1.5 g in sodium chloride 0.9 % 100 mL IVPB     1.5 g 200 mL/hr over 30 Minutes Intravenous To Surgery 04/27/19 1131 04/28/19 0819   04/28/19 0400  cefUROXime (ZINACEF) 750 mg in sodium chloride 0.9 % 100 mL IVPB     750 mg 200 mL/hr over 30 Minutes Intravenous To Surgery 04/27/19 1131 04/28/19 1313   04/15/19 0900  vancomycin (VANCOREADY) IVPB 750 mg/150 mL  Status:  Discontinued     750 mg 150 mL/hr over 60 Minutes Intravenous Every 8 hours 04/15/19 0821 05/19/19 1040   04/13/19 1400  vancomycin (VANCOCIN) IVPB 1000 mg/200 mL premix  Status:  Discontinued     1,000 mg 200 mL/hr over 60 Minutes Intravenous Every 8 hours 04/13/19 0839 04/15/19 0821   04/12/19 2200  vancomycin (VANCOREADY) IVPB 750 mg/150 mL  Status:  Discontinued     750 mg 150 mL/hr over 60 Minutes Intravenous Every 8 hours 04/12/19 1500 04/13/19 0839   04/10/19 1336  vancomycin (VANCOCIN) IVPB 1000 mg/200 mL premix  Status:  Discontinued     1,000 mg 200 mL/hr over 60 Minutes Intravenous Every 8 hours 04/10/19 1320 04/12/19 1500   04/08/19 1200  cefTRIAXone (ROCEPHIN) 2 g in sodium chloride 0.9 % 100 mL IVPB     2 g 200 mL/hr over 30 Minutes Intravenous Every 12 hours 04/08/19 1013 06/09/19 2359   04/08/19 0530  vancomycin (VANCOCIN) IVPB 1000 mg/200 mL premix  Status:  Discontinued     1,000 mg 200 mL/hr over 60 Minutes Intravenous Every 12 hours 04/07/19 1541 04/10/19 1320   04/07/19 1900  Ampicillin-Sulbactam (UNASYN) 3 g in sodium chloride 0.9 % 100 mL IVPB  Status:  Discontinued     3 g 200 mL/hr over 30 Minutes  Intravenous Every 6 hours 04/07/19 1457 04/08/19 1013   04/07/19 1730  vancomycin (VANCOREADY) IVPB 1500 mg/300 mL     1,500 mg 150 mL/hr over 120 Minutes Intravenous  Once 04/07/19 1541 04/07/19 1937   04/04/19 1400  ceFAZolin (ANCEF) IVPB 2g/100 mL premix  Status:  Discontinued     2 g 200 mL/hr over 30 Minutes Intravenous Every 8 hours 04/04/19 1119 04/07/19 1457   04/04/19 0400  Vancomycin (VANCOCIN) 1,250 mg in sodium chloride 0.9 % 250 mL IVPB  Status:  Discontinued     1,250 mg 166.7 mL/hr over 90 Minutes Intravenous Every 12 hours 04/03/19 1635 04/04/19 1159   04/02/19 1800  ceFEPIme (MAXIPIME) 2 g in sodium chloride 0.9 % 100 mL IVPB  Status:  Discontinued     2 g 200 mL/hr over 30 Minutes Intravenous Every 8 hours 04/02/19 1218 04/04/19 1127   04/02/19 1600  vancomycin (VANCOCIN) IVPB 1000 mg/200 mL premix  Status:  Discontinued     1,000 mg 200 mL/hr over 60 Minutes Intravenous Every 12 hours 04/02/19 0100 04/03/19 1635   04/02/19 0200  piperacillin-tazobactam (ZOSYN) IVPB 3.375 g  Status:  Discontinued     3.375 g 12.5 mL/hr over 240 Minutes Intravenous Every 8 hours 04/02/19 0059 04/02/19 1219   04/02/19 0200  vancomycin (VANCOCIN) 1,500 mg in sodium chloride 0.9 % 500 mL IVPB     1,500 mg 250 mL/hr over 120 Minutes Intravenous  Once 04/02/19 0100 04/02/19 0923   04/01/19 2040  ceFAZolin (ANCEF) 2-4 GM/100ML-% IVPB    Note to Pharmacy: [name]   : cabinet override      04/01/19 2040 04/02/19 0844        Subjective   Patient seen and examined at bedside in no acute distress and resting comfortably. No acute events overnight. Denies any acute complaints at this time. Ambulating. Tolerating diet well.   Objective   Vitals:   05/20/19 0433 05/20/19 0726 05/20/19 1126 05/20/19 1335  BP: 98/67 94/66 118/76 97/67  Pulse: 90 90 (!) 101 (!) 101  Resp: 18 17 18    Temp: 97.6 F (36.4 C) 98.6 F (37 C) 98.6 F (37 C)   TempSrc: Oral Oral Oral   SpO2: 99% 100%      Weight: 79.3 kg     Height:        Intake/Output Summary (Last 24 hours) at 05/20/2019 1514 Last data filed at 05/20/2019 1011 Gross per 24 hour  Intake 745.71 ml  Output --  Net 745.71 ml   Filed Weights   05/18/19 0300 05/19/19 0438 05/20/19 0433  Weight: 77.2 kg 78.7 kg 79.3 kg    Examination:  Physical Exam Vitals and nursing note reviewed.  Constitutional:      Appearance: Normal appearance.  HENT:     Head: Normocephalic and atraumatic.  Eyes:     Conjunctiva/sclera: Conjunctivae normal.  Cardiovascular:     Rate and Rhythm: Normal rate and regular rhythm.  Pulmonary:     Effort: Pulmonary effort is normal.     Breath sounds: Normal breath sounds.  Abdominal:     General: Abdomen is flat.     Palpations: Abdomen is soft.  Musculoskeletal:        General: No swelling or tenderness.  Skin:    Coloration: Skin is not jaundiced or pale.  Neurological:     Mental Status: She is alert. Mental status is at baseline.  Psychiatric:        Mood and Affect: Mood normal.        Behavior: Behavior normal.     Data Reviewed: I have personally reviewed following labs and imaging studies  CBC: Recent Labs  Lab 05/20/19 0504  WBC 3.9*  NEUTROABS 1.3*  HGB 8.7*  HCT 29.0*  MCV 88.4  PLT 741*   Basic Metabolic Panel: Recent Labs  Lab 05/17/19 0504 05/20/19 0504  NA 139 138  K 4.4 4.3  CL 107 107  CO2 24 25  GLUCOSE 94 83  BUN 18 15  CREATININE 0.59 0.62  CALCIUM 8.6* 8.6*  MG  --  1.8   GFR: Estimated Creatinine Clearance: 109.2 mL/min (by C-G formula based on SCr of 0.62 mg/dL). Liver Function Tests: No results for input(s):  AST, ALT, ALKPHOS, BILITOT, PROT, ALBUMIN in the last 168 hours. No results for input(s): LIPASE, AMYLASE in the last 168 hours. No results for input(s): AMMONIA in the last 168 hours. Coagulation Profile: No results for input(s): INR, PROTIME in the last 168 hours. Cardiac Enzymes: No results for input(s): CKTOTAL, CKMB,  CKMBINDEX, TROPONINI in the last 168 hours. BNP (last 3 results) No results for input(s): PROBNP in the last 8760 hours. HbA1C: No results for input(s): HGBA1C in the last 72 hours. CBG: Recent Labs  Lab 05/15/19 2101  GLUCAP 92   Lipid Profile: No results for input(s): CHOL, HDL, LDLCALC, TRIG, CHOLHDL, LDLDIRECT in the last 72 hours. Thyroid Function Tests: No results for input(s): TSH, T4TOTAL, FREET4, T3FREE, THYROIDAB in the last 72 hours. Anemia Panel: No results for input(s): VITAMINB12, FOLATE, FERRITIN, TIBC, IRON, RETICCTPCT in the last 72 hours. Sepsis Labs: No results for input(s): PROCALCITON, LATICACIDVEN in the last 168 hours.  No results found for this or any previous visit (from the past 240 hour(s)).       Radiology Studies: No results found.      Scheduled Meds: . aspirin EC  81 mg Oral Daily   Or  . aspirin  81 mg Per Tube Daily  . bisacodyl  10 mg Oral Daily   Or  . bisacodyl  10 mg Rectal Daily  . busPIRone  7.5 mg Oral BID  . Chlorhexidine Gluconate Cloth  6 each Topical Daily  . clopidogrel  75 mg Oral Daily  . [place] [facility], Patient & Family Education   Does not apply Once  . docusate sodium  200 mg Oral Daily  . escitalopram  10 mg Oral Daily  . feeding supplement (ENSURE ENLIVE)  237 mL Oral BID BM  . ferrous fumarate-b12-vitamic C-folic acid  1 capsule Oral BID PC  . furosemide  20 mg Oral Daily  . metoprolol tartrate  6.25 mg Oral BID  . pantoprazole  40 mg Oral Daily  . pneumococcal 23 valent vaccine  0.5 mL Intramuscular Tomorrow-1000  . potassium chloride  20 mEq Oral TID  . QUEtiapine  25 mg Oral QHS  . sodium chloride flush  3 mL Intravenous Q12H   Continuous Infusions: . sodium chloride    . sodium chloride    . cefTRIAXone (ROCEPHIN)  IV 2 g (05/20/19 1011)  . lactated ringers Stopped (04/30/19 2141)  . vancomycin 1,000 mg (05/20/19 0908)     Time spent: 25 minutes with over 50% of the time coordinating  the patient's care    Jae Dire, DO [facility] Pager [phone]  Call night coverage person covering after 7pm

## 2019-05-20 NOTE — Progress Notes (Signed)
CARDIAC REHAB PHASE I   PRE:  Rate/Rhythm: 90 SR  BP:  Supine: 81/48  Sitting: 89/51  Standing:    SaO2: 96%RA  MODE:  Ambulation: 1600 ft   POST:  Rate/Rhythm: 114 ST  BP:  Supine:   Sitting: 118/76  Standing:    SaO2: 96%RA 1101-1135 Pt walked 1600 ft with steady gait and tolerated well. Sat down to rest a couple of minutes at 800 ft. No complaints and tolerated well.  Sats good on RA.   Luetta Nutting, RN BSN  05/20/2019 11:30 AM

## 2019-05-21 NOTE — Progress Notes (Signed)
PROGRESS NOTE    Lauren Reyes    Code Status: Full Code  HQP:[id number] DOB: [birth year] DOA: 04/01/2019 LOS: 50 days  PCP: Patient, No Pcp Per CC:  Chief Complaint  Patient presents with  . Code Stroke       Hospital Summary   This is a 29 year old female with past medical history of substance abuse with heroin, crystal meth and cocaine who presented to the ER with aphasia and right-sided weakness and had a code stroke subsequently called.  She was admitted to the neurology ICU on 04/01/2019.  She was intubated and underwent thrombectomy for left MCA M3 occlusion and recanalization.  This was unfortunately complicated by dissection and pseudoaneurysm of the left ICA, s/p stent placement.  She was extubated on 04/04/2019.  She was found to have a right common femoral vein DVT along with right groin hematoma which was treated with IVC filter placement.  Patient had a TEE which showed infective endocarditis and CT surgery was consulted.  She is status post bioprosthetic aortic valve replacement and tricuspid valve repair on 04/28/2019 and is on IV antibiotics. CT surgery on board, requested [facility] takeover service on 5/6.    A & P   Principal Problem:   Endocarditis Active Problems:   Acute ischemic left MCA stroke (HCC)   Middle cerebral artery embolism, left   DVT (deep venous thrombosis) (HCC)   Encounter for central line placement   Endotracheal tube present   Cerebrovascular accident (CVA) due to embolism of precerebral artery (HCC)   Infective endocarditis   Normocytic anemia   Polysubstance abuse ([place])   IVDU (intravenous drug user)   Tachypnea   1. Culture negative aortic and tricuspid valve endocarditis s/p AV replacement and TV repair 05/04/2019 with Dr. Servando Snare a. Afebrile, hemodynamically stable on room air without leukocytosis b. Currently on IV Rocephin and vancomycin.   c. will need to remain hospitalized until completion of 6 weeks of therapy on 06/08/2019 due to  drug use d. CT surgery on board e. continue metoprolol and Lasix  2. Acute left-sided MCA embolic stroke s/p IR revascularization (3/24) a. Complicated by dissection and pseudoaneurysm of left ICA s/p placement of stent b. Initially on aspirin/Brilinta however Brilinta held for CT surgery c. Now on aspirin and Plavix dual antiplatelet for total 3 to 6 months d. We will follow up with Dr. Estanislado Pandy to decide on exact regimen and require outpatient neurology follow up  3. Right common femoral DVT with right groin hematoma s/p IVC filter placement 04/03/2019 a. Not a candidate for anticoagulation given septic emboli, dissection and hematoma as well as dual antiplatelets b. Revisit IVC filter removal plan with IR prior to discharge   4. Acute blood loss anemia, iron deficiency anemia, B12 deficiency and folate deficiency, stable a. Continue supplementation  5. Polysubstance abuse: THC, amphetamine and suspected IV drug use a. Currently stable without any signs of withdrawal b. Continue to encourage illicit drug abstinence at discharge  6. Anxiety, stable on BuSpar, Lexapro and Seroquel  7. Pain control a. Continue tapering oxycodone   DVT prophylaxis: Heparin Family Communication: Patient updated at bedside Disposition Plan:  Status is: Inpatient  Remains inpatient appropriate because:Unsafe discharge plan   Dispo: The patient is from: Home              Anticipated d/c is to: Home              Anticipated d/c date is: > 3 days  Patient currently is not medically stable to d/c.           Pressure injury documentation    None  Consultants  CT surgery Neurology  Infectious disease  Procedures  IR guided left MCA thrombectomy on 3/24 AV replacement and TV repair on 4/20   Antibiotics   Anti-infectives (From admission, onward)   Start     Dose/Rate Route Frequency Ordered Stop   05/19/19 2100  vancomycin (VANCOCIN) IVPB 1000 mg/200 mL premix      1,000 mg 200 mL/hr over 60 Minutes Intravenous Every 12 hours 05/19/19 1040     05/03/19 1000  fluconazole (DIFLUCAN) tablet 150 mg     150 mg Oral  Once 05/03/19 0804 05/03/19 1243   04/28/19 0400  vancomycin (VANCOREADY) IVPB 1250 mg/250 mL  Status:  Discontinued     1,250 mg 166.7 mL/hr over 90 Minutes Intravenous To Surgery 04/27/19 1131 04/28/19 1451   04/28/19 0400  cefUROXime (ZINACEF) 1.5 g in sodium chloride 0.9 % 100 mL IVPB     1.5 g 200 mL/hr over 30 Minutes Intravenous To Surgery 04/27/19 1131 04/28/19 0819   04/28/19 0400  cefUROXime (ZINACEF) 750 mg in sodium chloride 0.9 % 100 mL IVPB     750 mg 200 mL/hr over 30 Minutes Intravenous To Surgery 04/27/19 1131 04/28/19 1313   04/15/19 0900  vancomycin (VANCOREADY) IVPB 750 mg/150 mL  Status:  Discontinued     750 mg 150 mL/hr over 60 Minutes Intravenous Every 8 hours 04/15/19 0821 05/19/19 1040   04/13/19 1400  vancomycin (VANCOCIN) IVPB 1000 mg/200 mL premix  Status:  Discontinued     1,000 mg 200 mL/hr over 60 Minutes Intravenous Every 8 hours 04/13/19 0839 04/15/19 0821   04/12/19 2200  vancomycin (VANCOREADY) IVPB 750 mg/150 mL  Status:  Discontinued     750 mg 150 mL/hr over 60 Minutes Intravenous Every 8 hours 04/12/19 1500 04/13/19 0839   04/10/19 1336  vancomycin (VANCOCIN) IVPB 1000 mg/200 mL premix  Status:  Discontinued     1,000 mg 200 mL/hr over 60 Minutes Intravenous Every 8 hours 04/10/19 1320 04/12/19 1500   04/08/19 1200  cefTRIAXone (ROCEPHIN) 2 g in sodium chloride 0.9 % 100 mL IVPB     2 g 200 mL/hr over 30 Minutes Intravenous Every 12 hours 04/08/19 1013 06/09/19 2359   04/08/19 0530  vancomycin (VANCOCIN) IVPB 1000 mg/200 mL premix  Status:  Discontinued     1,000 mg 200 mL/hr over 60 Minutes Intravenous Every 12 hours 04/07/19 1541 04/10/19 1320   04/07/19 1900  Ampicillin-Sulbactam (UNASYN) 3 g in sodium chloride 0.9 % 100 mL IVPB  Status:  Discontinued     3 g 200 mL/hr over 30 Minutes  Intravenous Every 6 hours 04/07/19 1457 04/08/19 1013   04/07/19 1730  vancomycin (VANCOREADY) IVPB 1500 mg/300 mL     1,500 mg 150 mL/hr over 120 Minutes Intravenous  Once 04/07/19 1541 04/07/19 1937   04/04/19 1400  ceFAZolin (ANCEF) IVPB 2g/100 mL premix  Status:  Discontinued     2 g 200 mL/hr over 30 Minutes Intravenous Every 8 hours 04/04/19 1119 04/07/19 1457   04/04/19 0400  Vancomycin (VANCOCIN) 1,250 mg in sodium chloride 0.9 % 250 mL IVPB  Status:  Discontinued     1,250 mg 166.7 mL/hr over 90 Minutes Intravenous Every 12 hours 04/03/19 1635 04/04/19 1159   04/02/19 1800  ceFEPIme (MAXIPIME) 2 g in sodium chloride 0.9 % 100 mL IVPB  Status:  Discontinued     2 g 200 mL/hr over 30 Minutes Intravenous Every 8 hours 04/02/19 1218 04/04/19 1127   04/02/19 1600  vancomycin (VANCOCIN) IVPB 1000 mg/200 mL premix  Status:  Discontinued     1,000 mg 200 mL/hr over 60 Minutes Intravenous Every 12 hours 04/02/19 0100 04/03/19 1635   04/02/19 0200  piperacillin-tazobactam (ZOSYN) IVPB 3.375 g  Status:  Discontinued     3.375 g 12.5 mL/hr over 240 Minutes Intravenous Every 8 hours 04/02/19 0059 04/02/19 1219   04/02/19 0200  vancomycin (VANCOCIN) 1,500 mg in sodium chloride 0.9 % 500 mL IVPB     1,500 mg 250 mL/hr over 120 Minutes Intravenous  Once 04/02/19 0100 04/02/19 0923   04/01/19 2040  ceFAZolin (ANCEF) 2-4 GM/100ML-% IVPB    Note to Pharmacy: Channing Mutters   : cabinet override      04/01/19 2040 04/02/19 0844        Subjective   Patient seen and examined at bedside no acute distress and resting comfortably.  No events overnight.  Tolerating diet.  Denies any chest pain, shortness of breath, fever, nausea, vomiting, urinary complaints.  Admits to having bowel movement.  Otherwise ROS negative   Objective   Vitals:   05/20/19 2040 05/20/19 2330 05/21/19 0400 05/21/19 0807  BP: 104/81 91/69 92/72  103/66  Pulse: (!) 106 91 96 91  Resp: 18 18 18 18   Temp: 97.9 F (36.6  C) 99 F (37.2 C) (!) 97.4 F (36.3 C) 97.9 F (36.6 C)  TempSrc: Oral Oral Oral Oral  SpO2: 100% 99% 100% 100%  Weight:   80.1 kg   Height:        Intake/Output Summary (Last 24 hours) at 05/21/2019 1535 Last data filed at 05/21/2019 0600 Gross per 24 hour  Intake 500.67 ml  Output --  Net 500.67 ml   Filed Weights   05/19/19 0438 05/20/19 0433 05/21/19 0400  Weight: 78.7 kg 79.3 kg 80.1 kg    Examination:  Physical Exam Vitals and nursing note reviewed.  Constitutional:      Appearance: Normal appearance.  HENT:     Head: Normocephalic and atraumatic.  Eyes:     Conjunctiva/sclera: Conjunctivae normal.  Cardiovascular:     Rate and Rhythm: Normal rate and regular rhythm.  Pulmonary:     Effort: Pulmonary effort is normal.     Breath sounds: Normal breath sounds.  Abdominal:     General: Abdomen is flat.     Palpations: Abdomen is soft.  Musculoskeletal:        General: No swelling or tenderness.  Skin:    Coloration: Skin is not jaundiced or pale.  Neurological:     Mental Status: She is alert. Mental status is at baseline.  Psychiatric:        Mood and Affect: Mood normal.        Behavior: Behavior normal.     Data Reviewed: I have personally reviewed following labs and imaging studies  CBC: Recent Labs  Lab 05/20/19 0504  WBC 3.9*  NEUTROABS 1.3*  HGB 8.7*  HCT 29.0*  MCV 88.4  PLT 429*   Basic Metabolic Panel: Recent Labs  Lab 05/17/19 0504 05/20/19 0504  NA 139 138  K 4.4 4.3  CL 107 107  CO2 24 25  GLUCOSE 94 83  BUN 18 15  CREATININE 0.59 0.62  CALCIUM 8.6* 8.6*  MG  --  1.8   GFR: Estimated Creatinine Clearance: 109.7 mL/min (  by C-G formula based on SCr of 0.62 mg/dL). Liver Function Tests: No results for input(s): AST, ALT, ALKPHOS, BILITOT, PROT, ALBUMIN in the last 168 hours. No results for input(s): LIPASE, AMYLASE in the last 168 hours. No results for input(s): AMMONIA in the last 168 hours. Coagulation Profile: No  results for input(s): INR, PROTIME in the last 168 hours. Cardiac Enzymes: No results for input(s): CKTOTAL, CKMB, CKMBINDEX, TROPONINI in the last 168 hours. BNP (last 3 results) No results for input(s): PROBNP in the last 8760 hours. HbA1C: No results for input(s): HGBA1C in the last 72 hours. CBG: Recent Labs  Lab 05/15/19 2101  GLUCAP 92   Lipid Profile: No results for input(s): CHOL, HDL, LDLCALC, TRIG, CHOLHDL, LDLDIRECT in the last 72 hours. Thyroid Function Tests: No results for input(s): TSH, T4TOTAL, FREET4, T3FREE, THYROIDAB in the last 72 hours. Anemia Panel: No results for input(s): VITAMINB12, FOLATE, FERRITIN, TIBC, IRON, RETICCTPCT in the last 72 hours. Sepsis Labs: No results for input(s): PROCALCITON, LATICACIDVEN in the last 168 hours.  No results found for this or any previous visit (from the past 240 hour(s)).       Radiology Studies: No results found.      Scheduled Meds: . aspirin EC  81 mg Oral Daily   Or  . aspirin  81 mg Per Tube Daily  . bisacodyl  10 mg Oral Daily   Or  . bisacodyl  10 mg Rectal Daily  . busPIRone  7.5 mg Oral BID  . Chlorhexidine Gluconate Cloth  6 each Topical Daily  . clopidogrel  75 mg Oral Daily  . [facility], Patient [facility] Family Education   Does not apply Once  . docusate sodium  200 mg Oral Daily  . escitalopram  10 mg Oral Daily  . feeding supplement (ENSURE ENLIVE)  237 mL Oral BID BM  . ferrous fumarate-b12-vitamic C-folic acid  1 capsule Oral BID PC  . furosemide  20 mg Oral Daily  . metoprolol tartrate  6.25 mg Oral BID  . pantoprazole  40 mg Oral Daily  . pneumococcal 23 valent vaccine  0.5 mL Intramuscular Tomorrow-1000  . potassium chloride  20 mEq Oral TID  . QUEtiapine  25 mg Oral QHS  . sodium chloride flush  3 mL Intravenous Q12H   Continuous Infusions: . sodium chloride    . sodium chloride    . cefTRIAXone (ROCEPHIN)  IV 2 g (05/21/19 1342)  . lactated ringers Stopped  (04/30/19 2141)  . vancomycin 1,000 mg (05/21/19 0959)     Time spent: 15 minutes with over 50% of the time coordinating the patient's care    Jae Dire, DO [facility] Pager [phone]  Call night coverage person covering after 7pm

## 2019-05-21 NOTE — Progress Notes (Signed)
Received phone call from Fajardo who states he is pt's fiance.  No pt information was given to caller.

## 2019-05-21 NOTE — Progress Notes (Signed)
CARDIAC REHAB PHASE I   PRE:  Rate/Rhythm: 98 SR                Up in room   MODE:  Ambulation: 1300 ft   POST:  Rate/Rhythm: 100 SR  BP:  Supine:   Sitting: 105/78  Standing:    SaO2: 100%RA 1320-1342 Pt walked 1300 ft on RA with steady gait and tolerated well. Second walk today.    Luetta Nutting, RN BSN  05/21/2019 1:39 PM

## 2019-05-21 NOTE — Progress Notes (Signed)
      [address]       [place] [address]             [phone]      23 Days Post-Op Procedure(s) (LRB): AORTIC VALVE REPLACEMENT (AVR) using Edwards PERIMOUNT Magna Ease Pericardial Bioprosthesis - 29 MM Aortic Valve. (N/A) REPAIR OF TRICUSPID (N/A) TRANSESOPHAGEAL ECHOCARDIOGRAM (TEE) (N/A) Subjective: Feels okay this morning. Walking in the halls without issue.   Objective: Vital signs in last 24 hours: Temp:  [97.4 F (36.3 C)-99 F (37.2 C)] 97.9 F (36.6 C) (05/13 0807) Pulse Rate:  [91-106] 91 (05/13 0807) Cardiac Rhythm: Normal sinus rhythm (05/13 0718) Resp:  [17-18] 18 (05/13 0807) BP: (91-118)/(66-81) 103/66 (05/13 0807) SpO2:  [98 %-100 %] 100 % (05/13 0807) Weight:  [80.1 kg] 80.1 kg (05/13 0400)     Intake/Output from previous day: 05/12 0701 - 05/13 0700 In: 620.7 [P.O.:120; IV Piggyback:500.7] Out: -  Intake/Output this shift: No intake/output data recorded.  General appearance: alert, cooperative and no distress Heart: regular rate and rhythm, S1, S2 normal, no murmur, click, rub or gallop Lungs: clear to auscultation bilaterally Abdomen: soft, non-tender; bowel sounds normal; no masses,  no organomegaly Extremities: extremities normal, atraumatic, no cyanosis or edema Wound: clean and dry  Lab Results: Recent Labs    05/20/19 0504  WBC 3.9*  HGB 8.7*  HCT 29.0*  PLT 429*   BMET:  Recent Labs    05/20/19 0504  NA 138  K 4.3  CL 107  CO2 25  GLUCOSE 83  BUN 15  CREATININE 0.62  CALCIUM 8.6*    PT/INR: No results for input(s): LABPROT, INR in the last 72 hours. ABG    Component Value Date/Time   PHART 7.365 04/28/2019 2205   HCO3 24.5 04/28/2019 2205   TCO2 26 04/28/2019 2205   ACIDBASEDEF 1.0 04/28/2019 2205   O2SAT 97.0 04/28/2019 2205   CBG (last 3)  No results for input(s): GLUCAP in the last 72 hours.  Assessment/Plan: S/P Procedure(s) (LRB): AORTIC VALVE REPLACEMENT (AVR) using Edwards PERIMOUNT  Magna Ease Pericardial Bioprosthesis - 29 MM Aortic Valve. (N/A) REPAIR OF TRICUSPID (N/A) TRANSESOPHAGEAL ECHOCARDIOGRAM (TEE) (N/A)  1. CV- NSR in the 90s, BP stable 2. Pulm- no acute issues.Last CXR stable 3. Renal-creatinine 0.62, electrolytes okay 4. Blood glucose has been stable, patient is tolerating a normal diet 5. H and H trending up.  6. ContinueIVRocephin and Vancomycin.  7. Insomnia-resolved, sleeping well.   Plan:  No new labs. Continue medical care.    LOS: 50 days    [name] 05/21/2019

## 2019-05-22 LAB — VANCOMYCIN, TROUGH: Vancomycin Tr: 14 ug/mL — ABNORMAL LOW (ref 15–20)

## 2019-05-22 MED ORDER — VANCOMYCIN HCL 1250 MG/250ML IV SOLN
1250.0000 mg | Freq: Two times a day (BID) | INTRAVENOUS | Status: AC
  Administered 2019-05-22 – 2019-06-08 (×35): 1250 mg via INTRAVENOUS
  Filled 2019-05-22 (×38): qty 250

## 2019-05-22 NOTE — Progress Notes (Signed)
CARDIAC REHAB PHASE I                   Up in hall independently     *  MODE:  Ambulation: 1300+ ft   POST:  Rate/Rhythm: 104 ST  BP:  Supine:   Sitting: 104/69  Standing:    SaO2: 100%RA 3496-1164 Pt up in hall walking independently with steady gait. Finished walk with her. Very motivated to mobilize. No complaints.   Luetta Nutting, RN BSN  05/22/2019 8:31 AM

## 2019-05-22 NOTE — Progress Notes (Signed)
PROGRESS NOTE    Lauren Reyes    Code Status: Full Code  ZOX:[id number] DOB: Apr 09, 1990 DOA: 04/01/2019 LOS: 51 days  PCP: Patient, No Pcp Per CC:  Chief Complaint  Patient presents with  . Code Stroke       Hospital Summary   This is a 29 year old female with past medical history of substance abuse with heroin, crystal meth and cocaine who presented to the ER with aphasia and right-sided weakness and had a code stroke subsequently called.  She was admitted to the neurology ICU on 04/01/2019.  She was intubated and underwent thrombectomy for left MCA M3 occlusion and recanalization.  This was unfortunately complicated by dissection and pseudoaneurysm of the left ICA, s/p stent placement.  She was extubated on 04/04/2019.  She was found to have a right common femoral vein DVT along with right groin hematoma which was treated with IVC filter placement.  Patient had a TEE which showed infective endocarditis and CT surgery was consulted.  She is status post bioprosthetic aortic valve replacement and tricuspid valve repair on 04/28/2019 and is on IV antibiotics. CT surgery on board, requested TRH takeover service on 5/6.    A & P   Principal Problem:   Endocarditis Active Problems:   Acute ischemic left MCA stroke (HCC)   Middle cerebral artery embolism, left   DVT (deep venous thrombosis) (HCC)   Encounter for central line placement   Endotracheal tube present   Cerebrovascular accident (CVA) due to embolism of precerebral artery (HCC)   Infective endocarditis   Normocytic anemia   Polysubstance abuse (HCC)   IVDU (intravenous drug user)   Tachypnea   1. Culture negative aortic and tricuspid valve endocarditis s/p AV replacement and TV repair 05/04/2019 with Dr. Tyrone Sage a. Afebrile, hemodynamically stable on room air without leukocytosis b. Currently on IV Rocephin and vancomycin.   c. will need to remain hospitalized until completion of 6 weeks of therapy on 06/08/2019 due to  drug use d. CT surgery on board e. continue metoprolol and Lasix f. Question when sutures can be removed?  Appreciate CT surgery input  2. Acute left-sided MCA embolic stroke s/p IR revascularization (3/24) a. Complicated by dissection and pseudoaneurysm of left ICA s/p placement of stent b. Initially on aspirin/Brilinta however Brilinta held for CT surgery c. Now on aspirin and Plavix dual antiplatelet for total 3 to 6 months d. will need follow up with Dr. Corliss Skains to decide on exact regimen and require outpatient neurology follow up  3. Right common femoral DVT with right groin hematoma s/p IVC filter placement 04/03/2019 a. Not a candidate for anticoagulation given septic emboli, dissection and hematoma as well as dual antiplatelets b. Revisit IVC filter removal plan with IR prior to discharge   4. Acute blood loss anemia, iron deficiency anemia, B12 deficiency and folate deficiency, stable a. Continue supplementation  5. Polysubstance abuse: THC, amphetamine and suspected IV drug use a. Currently stable without any signs of withdrawal b. Continue to encourage illicit drug abstinence at discharge  6. Anxiety, stable on BuSpar, Lexapro and Seroquel  7. Pain control a. Continue tapering oxycodone   DVT prophylaxis: Heparin Family Communication: Patient updated at bedside Disposition Plan:  Status is: Inpatient  Remains inpatient appropriate because:Unsafe discharge plan   Dispo: The patient is from: Home              Anticipated d/c is to: Home  Anticipated d/c date is: > 3 days              Patient currently is not medically stable to d/c.           Pressure injury documentation    None  Consultants  CT surgery Neurology  Infectious disease  Procedures  IR guided left MCA thrombectomy on 3/24 AV replacement and TV repair on 4/20   Antibiotics   Anti-infectives (From admission, onward)   Start     Dose/Rate Route Frequency Ordered Stop    05/22/19 2100  vancomycin (VANCOREADY) IVPB 1250 mg/250 mL     1,250 mg 166.7 mL/hr over 90 Minutes Intravenous Every 12 hours 05/22/19 0937     05/19/19 2100  vancomycin (VANCOCIN) IVPB 1000 mg/200 mL premix  Status:  Discontinued     1,000 mg 200 mL/hr over 60 Minutes Intravenous Every 12 hours 05/19/19 1040 05/22/19 0937   05/03/19 1000  fluconazole (DIFLUCAN) tablet 150 mg     150 mg Oral  Once 05/03/19 0804 05/03/19 1243   04/28/19 0400  vancomycin (VANCOREADY) IVPB 1250 mg/250 mL  Status:  Discontinued     1,250 mg 166.7 mL/hr over 90 Minutes Intravenous To Surgery 04/27/19 1131 04/28/19 1451   04/28/19 0400  cefUROXime (ZINACEF) 1.5 g in sodium chloride 0.9 % 100 mL IVPB     1.5 g 200 mL/hr over 30 Minutes Intravenous To Surgery 04/27/19 1131 04/28/19 0819   04/28/19 0400  cefUROXime (ZINACEF) 750 mg in sodium chloride 0.9 % 100 mL IVPB     750 mg 200 mL/hr over 30 Minutes Intravenous To Surgery 04/27/19 1131 04/28/19 1313   04/15/19 0900  vancomycin (VANCOREADY) IVPB 750 mg/150 mL  Status:  Discontinued     750 mg 150 mL/hr over 60 Minutes Intravenous Every 8 hours 04/15/19 0821 05/19/19 1040   04/13/19 1400  vancomycin (VANCOCIN) IVPB 1000 mg/200 mL premix  Status:  Discontinued     1,000 mg 200 mL/hr over 60 Minutes Intravenous Every 8 hours 04/13/19 0839 04/15/19 0821   04/12/19 2200  vancomycin (VANCOREADY) IVPB 750 mg/150 mL  Status:  Discontinued     750 mg 150 mL/hr over 60 Minutes Intravenous Every 8 hours 04/12/19 1500 04/13/19 0839   04/10/19 1336  vancomycin (VANCOCIN) IVPB 1000 mg/200 mL premix  Status:  Discontinued     1,000 mg 200 mL/hr over 60 Minutes Intravenous Every 8 hours 04/10/19 1320 04/12/19 1500   04/08/19 1200  cefTRIAXone (ROCEPHIN) 2 g in sodium chloride 0.9 % 100 mL IVPB     2 g 200 mL/hr over 30 Minutes Intravenous Every 12 hours 04/08/19 1013 06/09/19 2359   04/08/19 0530  vancomycin (VANCOCIN) IVPB 1000 mg/200 mL premix  Status:  Discontinued       1,000 mg 200 mL/hr over 60 Minutes Intravenous Every 12 hours 04/07/19 1541 04/10/19 1320   04/07/19 1900  Ampicillin-Sulbactam (UNASYN) 3 g in sodium chloride 0.9 % 100 mL IVPB  Status:  Discontinued     3 g 200 mL/hr over 30 Minutes Intravenous Every 6 hours 04/07/19 1457 04/08/19 1013   04/07/19 1730  vancomycin (VANCOREADY) IVPB 1500 mg/300 mL     1,500 mg 150 mL/hr over 120 Minutes Intravenous  Once 04/07/19 1541 04/07/19 1937   04/04/19 1400  ceFAZolin (ANCEF) IVPB 2g/100 mL premix  Status:  Discontinued     2 g 200 mL/hr over 30 Minutes Intravenous Every 8 hours 04/04/19 1119 04/07/19 1457  04/04/19 0400  Vancomycin (VANCOCIN) 1,250 mg in sodium chloride 0.9 % 250 mL IVPB  Status:  Discontinued     1,250 mg 166.7 mL/hr over 90 Minutes Intravenous Every 12 hours 04/03/19 1635 04/04/19 1159   04/02/19 1800  ceFEPIme (MAXIPIME) 2 g in sodium chloride 0.9 % 100 mL IVPB  Status:  Discontinued     2 g 200 mL/hr over 30 Minutes Intravenous Every 8 hours 04/02/19 1218 04/04/19 1127   04/02/19 1600  vancomycin (VANCOCIN) IVPB 1000 mg/200 mL premix  Status:  Discontinued     1,000 mg 200 mL/hr over 60 Minutes Intravenous Every 12 hours 04/02/19 0100 04/03/19 1635   04/02/19 0200  piperacillin-tazobactam (ZOSYN) IVPB 3.375 g  Status:  Discontinued     3.375 g 12.5 mL/hr over 240 Minutes Intravenous Every 8 hours 04/02/19 0059 04/02/19 1219   04/02/19 0200  vancomycin (VANCOCIN) 1,500 mg in sodium chloride 0.9 % 500 mL IVPB     1,500 mg 250 mL/hr over 120 Minutes Intravenous  Once 04/02/19 0100 04/02/19 0923   04/01/19 2040  ceFAZolin (ANCEF) 2-4 GM/100ML-% IVPB    Note to Pharmacy: [name]   : cabinet override      04/01/19 2040 04/02/19 0844        Subjective   Seen and examined at bedside.  No complaints, no overnight events.  Wondering when she can have her stitches removed.    Objective   Vitals:   05/22/19 0415 05/22/19 0824 05/22/19 1155 05/22/19 1721  BP: 98/71  104/69 105/69 112/74  Pulse: 90 100 99 100  Resp: 16 16 18 15   Temp:  (!) 97.5 F (36.4 C) 97.9 F (36.6 C) 98.1 F (36.7 C)  TempSrc: Oral Oral Oral Oral  SpO2: 100% 100% 100% 100%  Weight: 79.8 kg     Height:        Intake/Output Summary (Last 24 hours) at 05/22/2019 1735 Last data filed at 05/22/2019 0436 Gross per 24 hour  Intake 400 ml  Output --  Net 400 ml   Filed Weights   05/20/19 0433 05/21/19 0400 05/22/19 0415  Weight: 79.3 kg 80.1 kg 79.8 kg    Examination:  Physical Exam Vitals and nursing note reviewed.  Constitutional:      Appearance: Normal appearance.  HENT:     Head: Normocephalic and atraumatic.  Eyes:     Conjunctiva/sclera: Conjunctivae normal.  Cardiovascular:     Rate and Rhythm: Normal rate and regular rhythm.  Pulmonary:     Effort: Pulmonary effort is normal.     Breath sounds: Normal breath sounds.  Abdominal:     General: Abdomen is flat.     Palpations: Abdomen is soft.  Musculoskeletal:        General: No swelling or tenderness.  Skin:    Coloration: Skin is not jaundiced or pale.  Neurological:     Mental Status: She is alert. Mental status is at baseline.  Psychiatric:        Mood and Affect: Mood normal.        Behavior: Behavior normal.     Data Reviewed: I have personally reviewed following labs and imaging studies  CBC: Recent Labs  Lab 05/20/19 0504  WBC 3.9*  NEUTROABS 1.3*  HGB 8.7*  HCT 29.0*  MCV 88.4  PLT 332*   Basic Metabolic Panel: Recent Labs  Lab 05/17/19 0504 05/20/19 0504  NA 139 138  K 4.4 4.3  CL 107 107  CO2 24  25  GLUCOSE 94 83  BUN 18 15  CREATININE 0.59 0.62  CALCIUM 8.6* 8.6*  MG  --  1.8   GFR: Estimated Creatinine Clearance: 109.6 mL/min (by C-G formula based on SCr of 0.62 mg/dL). Liver Function Tests: No results for input(s): AST, ALT, ALKPHOS, BILITOT, PROT, ALBUMIN in the last 168 hours. No results for input(s): LIPASE, AMYLASE in the last 168 hours. No results for  input(s): AMMONIA in the last 168 hours. Coagulation Profile: No results for input(s): INR, PROTIME in the last 168 hours. Cardiac Enzymes: No results for input(s): CKTOTAL, CKMB, CKMBINDEX, TROPONINI in the last 168 hours. BNP (last 3 results) No results for input(s): PROBNP in the last 8760 hours. HbA1C: No results for input(s): HGBA1C in the last 72 hours. CBG: Recent Labs  Lab 05/15/19 2101  GLUCAP 92   Lipid Profile: No results for input(s): CHOL, HDL, LDLCALC, TRIG, CHOLHDL, LDLDIRECT in the last 72 hours. Thyroid Function Tests: No results for input(s): TSH, T4TOTAL, FREET4, T3FREE, THYROIDAB in the last 72 hours. Anemia Panel: No results for input(s): VITAMINB12, FOLATE, FERRITIN, TIBC, IRON, RETICCTPCT in the last 72 hours. Sepsis Labs: No results for input(s): PROCALCITON, LATICACIDVEN in the last 168 hours.  No results found for this or any previous visit (from the past 240 hour(s)).       Radiology Studies: No results found.      Scheduled Meds: . aspirin EC  81 mg Oral Daily   Or  . aspirin  81 mg Per Tube Daily  . bisacodyl  10 mg Oral Daily   Or  . bisacodyl  10 mg Rectal Daily  . busPIRone  7.5 mg Oral BID  . Chlorhexidine Gluconate Cloth  6 each Topical Daily  . clopidogrel  75 mg Oral Daily  . [place] [facility], Patient [facility] Family Education   Does not apply Once  . docusate sodium  200 mg Oral Daily  . escitalopram  10 mg Oral Daily  . feeding supplement (ENSURE ENLIVE)  237 mL Oral BID BM  . ferrous fumarate-b12-vitamic C-folic acid  1 capsule Oral BID PC  . furosemide  20 mg Oral Daily  . metoprolol tartrate  6.25 mg Oral BID  . pantoprazole  40 mg Oral Daily  . pneumococcal 23 valent vaccine  0.5 mL Intramuscular Tomorrow-1000  . potassium chloride  20 mEq Oral TID  . QUEtiapine  25 mg Oral QHS  . sodium chloride flush  3 mL Intravenous Q12H   Continuous Infusions: . sodium chloride    . sodium chloride    . cefTRIAXone  (ROCEPHIN)  IV 2 g (05/22/19 1235)  . lactated ringers Stopped (04/30/19 2141)  . vancomycin       Time spent: 16 minutes with over 50% of the time coordinating the patient's care    Jae Dire, DO [facility] Pager [phone]  Call night coverage person covering after 7pm

## 2019-05-22 NOTE — Progress Notes (Signed)
Pharmacy Antibiotic Note Lauren Reyes is a 29 y.o. female with endocarditis involving aortic, mitral, and tricuspid valves with severe aortic regurgitation. Patient's condition is further complicated by septic emboli to her brain, lungs, and spleen. Respiratory culture grew MSSA, however, recent blood cultures are negative. Aortic valve tissue culture showing gram stain of rare gram positive cocci. She is s/p AVR/TVRepair on 4/20. Vancomycin doses are being adjusted on troughs alone due to concern of cerebritis.   Vancomycin trough this AM 14, slightly below goal at steady state s/p adjustment to 1000 mg every 12 hours, with previous levels being supratherapeutic at steady state on q8 hour dosing.  Last SCr 5/12 stable, no UOP recorded.    Plan: Adjust vancomycin to 1250 mg IV every 12 hours VT at steady state Continue ceftriaxone IV 2g q12 per ID Monitor renal function, clinical status End date for antibiotics: 06/08/19  Height: 5\' 6"  (167.6 cm) Weight: 79.8 kg (175 lb 14.8 oz) IBW/kg (Calculated) : 59.3  Temp (24hrs), Avg:97.8 F (36.6 C), Min:97.5 F (36.4 C), Max:98.1 F (36.7 C)  Recent Labs  Lab 05/17/19 0504 05/19/19 0736 05/20/19 0504 05/22/19 0816  WBC  --   --  3.9*  --   CREATININE 0.59  --  0.62  --   VANCOTROUGH  --  22*  --  14*    Estimated Creatinine Clearance: 109.6 mL/min (by C-G formula based on SCr of 0.62 mg/dL).    No Known Allergies  Antimicrobials this admission: Zosyn 3/25 >> 3/25 Cefepime 3/25 >> 3/27 Ancef 3/27 >> 3/30 Vanc 3/25 >> 3/27; restarted 3/30 >> (5/31) Unasyn 3/30>>3/31 Ceftriaxone 3/31>> (5/31) Flucon 4/25 Clotrimazole cream 4/25> 5/1   4/28 VT 16  4/21 VT 16 4/16 VT 17  4/10 VT (drawn 6hr after dose) 16  4/7 VT 23 4/10 VT 16 >> true trough likely 15 mcg/ml d/t late draw and timing of doses in past 24h >> cont 750 mg/8h 4/17 VT 17 mcg/ml >> cont 750 mg/8h  4/21 VT 16 >> cont same 5/5 Vanc 6hr level= 21 >> cont  same 5/11 VT: 22  >> Adj to 1000 mg q 12h  Microbiology results: 3/24 COVID/Flu >> neg 3/25 RCx >> moderate MSSA 3/25 MRSA PCR >> negative 3/25 BCx >> negative  4/16 MRSA PCR >> neg 4/20 MRSA PCR >> neg 4/20 Tricuspid valve specimen: neg 4/20 pericardial fluid: neg 4/20 aortic valve leaflets: 1 with rare gram positive cocci   Thank you for allowing pharmacy to be a part of this patient's care.  5/20, PharmD Clinical Pharmacist Please check AMION for all [facility] numbers 05/22/2019 9:20 AM

## 2019-05-22 NOTE — Progress Notes (Addendum)
      [address]       [place] [address]             [phone]      24 Days Post-Op Procedure(s) (LRB): AORTIC VALVE REPLACEMENT (AVR) using Edwards PERIMOUNT Magna Ease Pericardial Bioprosthesis - 29 MM Aortic Valve. (N/A) REPAIR OF TRICUSPID (N/A) TRANSESOPHAGEAL ECHOCARDIOGRAM (TEE) (N/A)   Subjective:  No new complaints  Objective: Vital signs in last 24 hours: Temp:  [97.5 F (36.4 C)-98.1 F (36.7 C)] 97.5 F (36.4 C) (05/14 0824) Pulse Rate:  [90-100] 100 (05/14 0824) Cardiac Rhythm: Normal sinus rhythm (05/14 0759) Resp:  [16-18] 16 (05/14 0824) BP: (79-110)/(55-80) 104/69 (05/14 0824) SpO2:  [98 %-100 %] 100 % (05/14 0824) Weight:  [79.8 kg] 79.8 kg (05/14 0415)  Intake/Output from previous day: 05/13 0701 - 05/14 0700 In: 400 [IV Piggyback:400] Out: -   General appearance: alert, cooperative and no distress Heart: regular rate and rhythm Lungs: clear to auscultation bilaterally Abdomen: soft, non-tender; bowel sounds normal; no masses,  no organomegaly Extremities: extremities normal, atraumatic, no cyanosis or edema Wound: clean and dr  Lab Results: Recent Labs    05/20/19 0504  WBC 3.9*  HGB 8.7*  HCT 29.0*  PLT 429*   BMET:  Recent Labs    05/20/19 0504  NA 138  K 4.3  CL 107  CO2 25  GLUCOSE 83  BUN 15  CREATININE 0.62  CALCIUM 8.6*    PT/INR: No results for input(s): LABPROT, INR in the last 72 hours. ABG    Component Value Date/Time   PHART 7.365 04/28/2019 2205   HCO3 24.5 04/28/2019 2205   TCO2 26 04/28/2019 2205   ACIDBASEDEF 1.0 04/28/2019 2205   O2SAT 97.0 04/28/2019 2205   CBG (last 3)  No results for input(s): GLUCAP in the last 72 hours.  Assessment/Plan: S/P Procedure(s) (LRB): AORTIC VALVE REPLACEMENT (AVR) using Edwards PERIMOUNT Magna Ease Pericardial Bioprosthesis - 29 MM Aortic Valve. (N/A) REPAIR OF TRICUSPID (N/A) TRANSESOPHAGEAL ECHOCARDIOGRAM (TEE) (N/A)  1. CV- NSR, BP low at  times, but overall stable 2. Pulm- no acute issues 3. ID- Endocarditis remains afebrile, no leukocytosis, continue ABX per ID recs 4. Dispo- patient stable, care per primary   LOS: 51 days    Lowella Dandy, PA-C 05/22/2019  I have seen and examined Lauren Reyes and agree with the above assessment  and plan.  Delight Ovens MD Beeper [phone] Office [phone] 05/22/2019 2:13 PM

## 2019-05-22 NOTE — Plan of Care (Signed)
  Problem: Education: Goal: Knowledge of disease or condition will improve Outcome: Progressing   Problem: Education: Goal: Knowledge of secondary prevention will improve Outcome: Progressing   

## 2019-05-23 MED ORDER — OXYCODONE HCL 5 MG PO TABS
5.0000 mg | ORAL_TABLET | Freq: Every day | ORAL | Status: DC | PRN
  Administered 2019-05-25 – 2019-05-27 (×3): 5 mg via ORAL
  Filled 2019-05-23 (×4): qty 1

## 2019-05-23 NOTE — Progress Notes (Signed)
CARDIAC REHAB PHASE I   Pt seen walking independently in hallway. Denies needs or questions. Will f/u Monday.  Reynold Bowen, RN BSN 05/23/2019 12:26 PM

## 2019-05-23 NOTE — Progress Notes (Signed)
PROGRESS NOTE    Lauren Reyes    Code Status: Full Code  MWN:[id number] DOB: October 31, 1990 DOA: 04/01/2019 LOS: 52 days  PCP: Patient, No Pcp Per CC:  Chief Complaint  Patient presents with  . Code Stroke       Hospital Summary   This is a 29 year old female with past medical history of substance abuse with heroin, crystal meth and cocaine who presented to the ER with aphasia and right-sided weakness and had a code stroke subsequently called.  She was admitted to the neurology ICU on 04/01/2019.  She was intubated and underwent thrombectomy for left MCA M3 occlusion and recanalization.  This was unfortunately complicated by dissection and pseudoaneurysm of the left ICA, s/p stent placement.  She was extubated on 04/04/2019.  She was found to have a right common femoral vein DVT along with right groin hematoma which was treated with IVC filter placement.  Patient had a TEE which showed infective endocarditis and CT surgery was consulted.  She is status post bioprosthetic aortic valve replacement and tricuspid valve repair on 04/28/2019 and is on IV antibiotics. CT surgery on board, requested TRH takeover service on 5/6.    A & P   Principal Problem:   Endocarditis Active Problems:   Acute ischemic left MCA stroke (HCC)   Middle cerebral artery embolism, left   DVT (deep venous thrombosis) (HCC)   Encounter for central line placement   Endotracheal tube present   Cerebrovascular accident (CVA) due to embolism of precerebral artery (HCC)   Infective endocarditis   Normocytic anemia   Polysubstance abuse (HCC)   IVDU (intravenous drug user)   Tachypnea   1. Culture negative aortic and tricuspid valve endocarditis s/p AV replacement and TV repair 05/04/2019 with Dr. Tyrone Sage a. Currently on IV Rocephin and vancomycin.   b. will need to remain hospitalized until completion of 6 weeks of therapy on 06/08/2019 due to drug use c. CT surgery on board d. continue metoprolol and Lasix  e. Ok to remove stitches today  2. Acute left-sided MCA embolic stroke s/p IR revascularization (3/24) a. Complicated by dissection and pseudoaneurysm of left ICA s/p placement of stent b. Initially on aspirin/Brilinta however Brilinta held for CT surgery c. Now on aspirin and Plavix dual antiplatelet for total 3 to 6 months d. will need follow up with Dr. Corliss Skains to decide on exact regimen and require outpatient neurology follow up  3. Right common femoral DVT with right groin hematoma s/p IVC filter placement 04/03/2019 a. Not a candidate for anticoagulation given septic emboli, dissection and hematoma as well as dual antiplatelets b. Revisit IVC filter removal plan with IR prior to discharge   4. Acute blood loss anemia, iron deficiency anemia, B12 deficiency and folate deficiency, stable a. Continue supplementation  5. Polysubstance abuse: THC, amphetamine and suspected IV drug use a. Currently stable without any signs of withdrawal b. Continue to encourage illicit drug abstinence at discharge  6. Anxiety, stable on BuSpar, Lexapro and Seroquel  7. Pain control a. Taper oxycodone to daily as needed  8. Sinus tachycardia with soft BP, possibly dry a. Reports she has low blood pressure and tachycardia at baseline b. Encourage increased p.o. intake and consider IV fluids if continued sinus tach and low BP overnight   DVT prophylaxis: Heparin Family Communication: Patient updated at bedside Disposition Plan:  Status is: Inpatient  Remains inpatient appropriate because:Unsafe discharge plan   Dispo: The patient is from: Home  Anticipated d/c is to: Home              Anticipated d/c date is: > 3 days              Patient currently is not medically stable to d/c.           Pressure injury documentation    None  Consultants  CT surgery Neurology  Infectious disease  Procedures  IR guided left MCA thrombectomy on 3/24 AV replacement and TV repair  on 4/20   Antibiotics   Anti-infectives (From admission, onward)   Start     Dose/Rate Route Frequency Ordered Stop   05/22/19 2100  vancomycin (VANCOREADY) IVPB 1250 mg/250 mL     1,250 mg 166.7 mL/hr over 90 Minutes Intravenous Every 12 hours 05/22/19 0937     05/19/19 2100  vancomycin (VANCOCIN) IVPB 1000 mg/200 mL premix  Status:  Discontinued     1,000 mg 200 mL/hr over 60 Minutes Intravenous Every 12 hours 05/19/19 1040 05/22/19 0937   05/03/19 1000  fluconazole (DIFLUCAN) tablet 150 mg     150 mg Oral  Once 05/03/19 0804 05/03/19 1243   04/28/19 0400  vancomycin (VANCOREADY) IVPB 1250 mg/250 mL  Status:  Discontinued     1,250 mg 166.7 mL/hr over 90 Minutes Intravenous To Surgery 04/27/19 1131 04/28/19 1451   04/28/19 0400  cefUROXime (ZINACEF) 1.5 g in sodium chloride 0.9 % 100 mL IVPB     1.5 g 200 mL/hr over 30 Minutes Intravenous To Surgery 04/27/19 1131 04/28/19 0819   04/28/19 0400  cefUROXime (ZINACEF) 750 mg in sodium chloride 0.9 % 100 mL IVPB     750 mg 200 mL/hr over 30 Minutes Intravenous To Surgery 04/27/19 1131 04/28/19 1313   04/15/19 0900  vancomycin (VANCOREADY) IVPB 750 mg/150 mL  Status:  Discontinued     750 mg 150 mL/hr over 60 Minutes Intravenous Every 8 hours 04/15/19 0821 05/19/19 1040   04/13/19 1400  vancomycin (VANCOCIN) IVPB 1000 mg/200 mL premix  Status:  Discontinued     1,000 mg 200 mL/hr over 60 Minutes Intravenous Every 8 hours 04/13/19 0839 04/15/19 0821   04/12/19 2200  vancomycin (VANCOREADY) IVPB 750 mg/150 mL  Status:  Discontinued     750 mg 150 mL/hr over 60 Minutes Intravenous Every 8 hours 04/12/19 1500 04/13/19 0839   04/10/19 1336  vancomycin (VANCOCIN) IVPB 1000 mg/200 mL premix  Status:  Discontinued     1,000 mg 200 mL/hr over 60 Minutes Intravenous Every 8 hours 04/10/19 1320 04/12/19 1500   04/08/19 1200  cefTRIAXone (ROCEPHIN) 2 g in sodium chloride 0.9 % 100 mL IVPB     2 g 200 mL/hr over 30 Minutes Intravenous Every 12  hours 04/08/19 1013 06/09/19 2359   04/08/19 0530  vancomycin (VANCOCIN) IVPB 1000 mg/200 mL premix  Status:  Discontinued     1,000 mg 200 mL/hr over 60 Minutes Intravenous Every 12 hours 04/07/19 1541 04/10/19 1320   04/07/19 1900  Ampicillin-Sulbactam (UNASYN) 3 g in sodium chloride 0.9 % 100 mL IVPB  Status:  Discontinued     3 g 200 mL/hr over 30 Minutes Intravenous Every 6 hours 04/07/19 1457 04/08/19 1013   04/07/19 1730  vancomycin (VANCOREADY) IVPB 1500 mg/300 mL     1,500 mg 150 mL/hr over 120 Minutes Intravenous  Once 04/07/19 1541 04/07/19 1937   04/04/19 1400  ceFAZolin (ANCEF) IVPB 2g/100 mL premix  Status:  Discontinued  2 g 200 mL/hr over 30 Minutes Intravenous Every 8 hours 04/04/19 1119 04/07/19 1457   04/04/19 0400  Vancomycin (VANCOCIN) 1,250 mg in sodium chloride 0.9 % 250 mL IVPB  Status:  Discontinued     1,250 mg 166.7 mL/hr over 90 Minutes Intravenous Every 12 hours 04/03/19 1635 04/04/19 1159   04/02/19 1800  ceFEPIme (MAXIPIME) 2 g in sodium chloride 0.9 % 100 mL IVPB  Status:  Discontinued     2 g 200 mL/hr over 30 Minutes Intravenous Every 8 hours 04/02/19 1218 04/04/19 1127   04/02/19 1600  vancomycin (VANCOCIN) IVPB 1000 mg/200 mL premix  Status:  Discontinued     1,000 mg 200 mL/hr over 60 Minutes Intravenous Every 12 hours 04/02/19 0100 04/03/19 1635   04/02/19 0200  piperacillin-tazobactam (ZOSYN) IVPB 3.375 g  Status:  Discontinued     3.375 g 12.5 mL/hr over 240 Minutes Intravenous Every 8 hours 04/02/19 0059 04/02/19 1219   04/02/19 0200  vancomycin (VANCOCIN) 1,500 mg in sodium chloride 0.9 % 500 mL IVPB     1,500 mg 250 mL/hr over 120 Minutes Intravenous  Once 04/02/19 0100 04/02/19 0923   04/01/19 2040  ceFAZolin (ANCEF) 2-4 GM/100ML-% IVPB    Note to Pharmacy: [name]   : cabinet override      04/01/19 2040 04/02/19 0844        Subjective   Reports she has elevated heart rate and low blood pressure at baseline.  Asymptomatic at  this point.  No complaints or overnight events.   Objective   Vitals:   05/23/19 0029 05/23/19 0448 05/23/19 0815 05/23/19 1123  BP: (!) 97/59 (!) 96/53 101/71 94/71  Pulse: 100 (!) 109 (!) 103 92  Resp: 16 16 20 19   Temp: 97.6 F (36.4 C) 97.9 F (36.6 C) 98.8 F (37.1 C) 97.9 F (36.6 C)  TempSrc: Oral Oral Oral Oral  SpO2: 100% 100% 98% 100%  Weight:  79.2 kg    Height:        Intake/Output Summary (Last 24 hours) at 05/23/2019 1438 Last data filed at 05/23/2019 1300 Gross per 24 hour  Intake 360 ml  Output -  Net 360 ml   Filed Weights   05/21/19 0400 05/22/19 0415 05/23/19 0448  Weight: 80.1 kg 79.8 kg 79.2 kg    Examination:  Physical Exam Vitals and nursing note reviewed.  Constitutional:      Appearance: Normal appearance.  HENT:     Head: Normocephalic and atraumatic.  Eyes:     Conjunctiva/sclera: Conjunctivae normal.  Cardiovascular:     Rate and Rhythm: Regular rhythm. Tachycardia present.     Comments: 2/6 systolic murmur Pulmonary:     Effort: Pulmonary effort is normal.     Breath sounds: Normal breath sounds.  Abdominal:     General: Abdomen is flat.     Palpations: Abdomen is soft.  Musculoskeletal:        General: No swelling or tenderness.  Skin:    Coloration: Skin is not jaundiced or pale.  Neurological:     Mental Status: She is alert. Mental status is at baseline.  Psychiatric:        Mood and Affect: Mood normal.        Behavior: Behavior normal.     Data Reviewed: I have personally reviewed following labs and imaging studies  CBC: Recent Labs  Lab 05/20/19 0504  WBC 3.9*  NEUTROABS 1.3*  HGB 8.7*  HCT 29.0*  MCV 88.4  PLT 429*   Basic Metabolic Panel: Recent Labs  Lab 05/17/19 0504 05/20/19 0504  NA 139 138  K 4.4 4.3  CL 107 107  CO2 24 25  GLUCOSE 94 83  BUN 18 15  CREATININE 0.59 0.62  CALCIUM 8.6* 8.6*  MG  --  1.8   GFR: Estimated Creatinine Clearance: 109.2 mL/min (by C-G formula based on SCr of  0.62 mg/dL). Liver Function Tests: No results for input(s): AST, ALT, ALKPHOS, BILITOT, PROT, ALBUMIN in the last 168 hours. No results for input(s): LIPASE, AMYLASE in the last 168 hours. No results for input(s): AMMONIA in the last 168 hours. Coagulation Profile: No results for input(s): INR, PROTIME in the last 168 hours. Cardiac Enzymes: No results for input(s): CKTOTAL, CKMB, CKMBINDEX, TROPONINI in the last 168 hours. BNP (last 3 results) No results for input(s): PROBNP in the last 8760 hours. HbA1C: No results for input(s): HGBA1C in the last 72 hours. CBG: No results for input(s): GLUCAP in the last 168 hours. Lipid Profile: No results for input(s): CHOL, HDL, LDLCALC, TRIG, CHOLHDL, LDLDIRECT in the last 72 hours. Thyroid Function Tests: No results for input(s): TSH, T4TOTAL, FREET4, T3FREE, THYROIDAB in the last 72 hours. Anemia Panel: No results for input(s): VITAMINB12, FOLATE, FERRITIN, TIBC, IRON, RETICCTPCT in the last 72 hours. Sepsis Labs: No results for input(s): PROCALCITON, LATICACIDVEN in the last 168 hours.  No results found for this or any previous visit (from the past 240 hour(s)).       Radiology Studies: No results found.      Scheduled Meds: . aspirin EC  81 mg Oral Daily   Or  . aspirin  81 mg Per Tube Daily  . bisacodyl  10 mg Oral Daily   Or  . bisacodyl  10 mg Rectal Daily  . busPIRone  7.5 mg Oral BID  . Chlorhexidine Gluconate Cloth  6 each Topical Daily  . clopidogrel  75 mg Oral Daily  . [facility], Patient [facility] Family Education   Does not apply Once  . docusate sodium  200 mg Oral Daily  . escitalopram  10 mg Oral Daily  . feeding supplement (ENSURE ENLIVE)  237 mL Oral BID BM  . ferrous fumarate-b12-vitamic C-folic acid  1 capsule Oral BID PC  . furosemide  20 mg Oral Daily  . metoprolol tartrate  6.25 mg Oral BID  . pantoprazole  40 mg Oral Daily  . pneumococcal 23 valent vaccine  0.5 mL Intramuscular  Tomorrow-1000  . potassium chloride  20 mEq Oral TID  . QUEtiapine  25 mg Oral QHS  . sodium chloride flush  3 mL Intravenous Q12H   Continuous Infusions: . sodium chloride    . sodium chloride    . cefTRIAXone (ROCEPHIN)  IV 2 g (05/23/19 1032)  . lactated ringers Stopped (04/30/19 2141)  . vancomycin 1,250 mg (05/23/19 0850)     Time spent: 17 minutes with over 50% of the time coordinating the patient's care    Jae Dire, DO [facility] Pager [phone]  Call night coverage person covering after 7pm

## 2019-05-23 NOTE — Progress Notes (Signed)
      [address]       [place] [id number]             [phone]      25 Days Post-Op Procedure(s) (LRB): AORTIC VALVE REPLACEMENT (AVR) using Edwards PERIMOUNT Magna Ease Pericardial Bioprosthesis - 29 MM Aortic Valve. (N/A) REPAIR OF TRICUSPID (N/A) TRANSESOPHAGEAL ECHOCARDIOGRAM (TEE) (N/A)   Subjective:  No complaints.  Sleeping this morning and wants to go back to sleep.  Objective: Vital signs in last 24 hours: Temp:  [97.6 F (36.4 C)-98.8 F (37.1 C)] 98.8 F (37.1 C) (05/15 0815) Pulse Rate:  [98-109] 103 (05/15 0815) Cardiac Rhythm: Sinus tachycardia (05/14 1901) Resp:  [14-20] 20 (05/15 0815) BP: (96-112)/(53-79) 101/71 (05/15 0815) SpO2:  [98 %-100 %] 98 % (05/15 0815) Weight:  [79.2 kg] 79.2 kg (05/15 0448)  General appearance: alert, cooperative and no distress Heart: regular rate and rhythm Lungs: clear to auscultation bilaterally Wound: clean, healing without issue  Lab Results: No results for input(s): WBC, HGB, HCT, PLT in the last 72 hours. BMET: No results for input(s): NA, K, CL, CO2, GLUCOSE, BUN, CREATININE, CALCIUM in the last 72 hours.  PT/INR: No results for input(s): LABPROT, INR in the last 72 hours. ABG    Component Value Date/Time   PHART 7.365 04/28/2019 2205   HCO3 24.5 04/28/2019 2205   TCO2 26 04/28/2019 2205   ACIDBASEDEF 1.0 04/28/2019 2205   O2SAT 97.0 04/28/2019 2205   CBG (last 3)  No results for input(s): GLUCAP in the last 72 hours.  Assessment/Plan: S/P Procedure(s) (LRB): AORTIC VALVE REPLACEMENT (AVR) using Edwards PERIMOUNT Magna Ease Pericardial Bioprosthesis - 29 MM Aortic Valve. (N/A) REPAIR OF TRICUSPID (N/A) TRANSESOPHAGEAL ECHOCARDIOGRAM (TEE) (N/A)  1. CV- hemodynamically stable 2. Pulm- no acute issues 3. ID- Endocarditis, continue ABX per ID recs, completion date is 6/1 4. Dispo- patient stable, care per primary   LOS: 52 days    Lowella Dandy, PA-C  05/23/2019

## 2019-05-23 NOTE — Progress Notes (Signed)
Chest tube sutures removed per md order

## 2019-05-24 NOTE — Progress Notes (Signed)
      [address]       [place] [address]             [phone]      26 Days Post-Op Procedure(s) (LRB): AORTIC VALVE REPLACEMENT (AVR) using Edwards PERIMOUNT Magna Ease Pericardial Bioprosthesis - 29 MM Aortic Valve. (N/A) REPAIR OF TRICUSPID (N/A) TRANSESOPHAGEAL ECHOCARDIOGRAM (TEE) (N/A)   Subjective:  Up in chair, eating breakfast.  No complaints  Objective: Vital signs in last 24 hours: Temp:  [97.9 F (36.6 C)-98.7 F (37.1 C)] 98.4 F (36.9 C) (05/16 0849) Pulse Rate:  [69-101] 69 (05/16 0849) Cardiac Rhythm: Normal sinus rhythm (05/16 0822) Resp:  [16-20] 20 (05/16 0849) BP: (93-104)/(64-77) 93/64 (05/16 0849) SpO2:  [95 %-100 %] 99 % (05/16 0849) Weight:  [79.3 kg] 79.3 kg (05/16 0455)  Intake/Output from previous day: 05/15 0701 - 05/16 0700 In: 480 [P.O.:480] Out: -   General appearance: alert, cooperative and no distress Heart: regular rate and rhythm Lungs: clear to auscultation bilaterally Abdomen: soft, non-tender; bowel sounds normal; no masses,  no organomegaly Extremities: extremities normal, atraumatic, no cyanosis or edema Wound: healing w/o evidence of infection.  Some stitch present at superior and middle of incision... clipped and removed  Lab Results: No results for input(s): WBC, HGB, HCT, PLT in the last 72 hours. BMET: No results for input(s): NA, K, CL, CO2, GLUCOSE, BUN, CREATININE, CALCIUM in the last 72 hours.  PT/INR: No results for input(s): LABPROT, INR in the last 72 hours. ABG    Component Value Date/Time   PHART 7.365 04/28/2019 2205   HCO3 24.5 04/28/2019 2205   TCO2 26 04/28/2019 2205   ACIDBASEDEF 1.0 04/28/2019 2205   O2SAT 97.0 04/28/2019 2205   CBG (last 3)  No results for input(s): GLUCAP in the last 72 hours.  Assessment/Plan: S/P Procedure(s) (LRB): AORTIC VALVE REPLACEMENT (AVR) using Edwards PERIMOUNT Magna Ease Pericardial Bioprosthesis - 29 MM Aortic Valve. (N/A) REPAIR OF TRICUSPID  (N/A) TRANSESOPHAGEAL ECHOCARDIOGRAM (TEE) (N/A)  1. CV- hemodynamically stable on Lopressor 2. Pulm- no acute issues, continue IS 3. ID- afebrile, on IV Rocephin per ID, continue ABX 4. Wound- removed suture sticking out of sternotomy, no sign of infection 5. Dispo- patient stable, care per primary   LOS: 53 days    Lowella Dandy, PA-C  05/24/2019

## 2019-05-24 NOTE — Progress Notes (Addendum)
PROGRESS NOTE    Lauren Reyes    Code Status: Full Code  BPZ:[id number] DOB: [birth year] DOA: 04/01/2019 LOS: 53 days  PCP: Patient, No Pcp Per CC:  Chief Complaint  Patient presents with  . Code Stroke       Hospital Summary   This is a 29 year old female with past medical history of substance abuse with heroin, crystal meth and cocaine who presented to the ER with aphasia and right-sided weakness and had a code stroke subsequently called.  She was admitted to the neurology ICU on 04/01/2019.  She was intubated and underwent thrombectomy for left MCA M3 occlusion and recanalization.  This was unfortunately complicated by dissection and pseudoaneurysm of the left ICA, s/p stent placement.  She was extubated on 04/04/2019.  She was found to have a right common femoral vein DVT along with right groin hematoma which was treated with IVC filter placement.  Patient had a TEE which showed infective endocarditis and CT surgery was consulted.  She is status post bioprosthetic aortic valve replacement and tricuspid valve repair on 04/28/2019 and is on IV antibiotics. CT surgery on board, requested TRH takeover service on 5/6.    A & P   Principal Problem:   Endocarditis Active Problems:   Acute ischemic left MCA stroke (HCC)   Middle cerebral artery embolism, left   DVT (deep venous thrombosis) (HCC)   Encounter for central line placement   Endotracheal tube present   Cerebrovascular accident (CVA) due to embolism of precerebral artery (HCC)   Infective endocarditis   Normocytic anemia   Polysubstance abuse (HCC)   IVDU (intravenous drug user)   Tachypnea   1. Culture negative aortic and tricuspid valve endocarditis s/p AV replacement and TV repair 05/04/2019 with Dr. Tyrone Sage a. Currently on IV Rocephin and vancomycin.  will need to remain hospitalized until completion of 6 weeks of therapy on 06/08/2019 due to drug use b. CT surgery on board c. Will check labs tomorrow d. continue  metoprolol and Lasix  2. Acute left-sided MCA embolic stroke s/p IR revascularization (3/24) a. Complicated by dissection and pseudoaneurysm of left ICA s/p placement of stent b. Initially on aspirin/Brilinta however Brilinta held for CT surgery c. Now on aspirin and Plavix dual antiplatelet for total 3 to 6 months d. will need follow up with Dr. Corliss Skains to decide on exact regimen and require outpatient neurology follow up  3. Right common femoral DVT with right groin hematoma s/p IVC filter placement 04/03/2019 a. Not a candidate for anticoagulation given septic emboli, dissection and hematoma as well as dual antiplatelets b. Revisit IVC filter removal plan with IR prior to discharge   4. Acute blood loss anemia, iron deficiency anemia, B12 deficiency and folate deficiency, stable a. Continue supplementation  5. Polysubstance abuse: THC, amphetamine and suspected IV drug use a. Currently stable without any signs of withdrawal b. Continue to encourage illicit drug abstinence at discharge  6. Anxiety, stable on BuSpar, Lexapro and Seroquel  7. Pain control a. Taper oxycodone to daily as needed  8. Sinus tachycardia with soft BP, likely baseline   DVT prophylaxis: Heparin Family Communication: Patient updated at bedside Disposition Plan:  Status is: Inpatient  Remains inpatient appropriate because:Unsafe discharge plan   Dispo: The patient is from: Home              Anticipated d/c is to: Home              Anticipated d/c date is: >  3 days              Patient currently is not medically stable to d/c.      Pressure injury documentation    None  Consultants  CT surgery Neurology  Infectious disease  Procedures  IR guided left MCA thrombectomy on 3/24 AV replacement and TV repair on 4/20   Antibiotics   Anti-infectives (From admission, onward)   Start     Dose/Rate Route Frequency Ordered Stop   05/22/19 2100  vancomycin (VANCOREADY) IVPB 1250 mg/250 mL      1,250 mg 166.7 mL/hr over 90 Minutes Intravenous Every 12 hours 05/22/19 0937     05/19/19 2100  vancomycin (VANCOCIN) IVPB 1000 mg/200 mL premix  Status:  Discontinued     1,000 mg 200 mL/hr over 60 Minutes Intravenous Every 12 hours 05/19/19 1040 05/22/19 0937   05/03/19 1000  fluconazole (DIFLUCAN) tablet 150 mg     150 mg Oral  Once 05/03/19 0804 05/03/19 1243   04/28/19 0400  vancomycin (VANCOREADY) IVPB 1250 mg/250 mL  Status:  Discontinued     1,250 mg 166.7 mL/hr over 90 Minutes Intravenous To Surgery 04/27/19 1131 04/28/19 1451   04/28/19 0400  cefUROXime (ZINACEF) 1.5 g in sodium chloride 0.9 % 100 mL IVPB     1.5 g 200 mL/hr over 30 Minutes Intravenous To Surgery 04/27/19 1131 04/28/19 0819   04/28/19 0400  cefUROXime (ZINACEF) 750 mg in sodium chloride 0.9 % 100 mL IVPB     750 mg 200 mL/hr over 30 Minutes Intravenous To Surgery 04/27/19 1131 04/28/19 1313   04/15/19 0900  vancomycin (VANCOREADY) IVPB 750 mg/150 mL  Status:  Discontinued     750 mg 150 mL/hr over 60 Minutes Intravenous Every 8 hours 04/15/19 0821 05/19/19 1040   04/13/19 1400  vancomycin (VANCOCIN) IVPB 1000 mg/200 mL premix  Status:  Discontinued     1,000 mg 200 mL/hr over 60 Minutes Intravenous Every 8 hours 04/13/19 0839 04/15/19 0821   04/12/19 2200  vancomycin (VANCOREADY) IVPB 750 mg/150 mL  Status:  Discontinued     750 mg 150 mL/hr over 60 Minutes Intravenous Every 8 hours 04/12/19 1500 04/13/19 0839   04/10/19 1336  vancomycin (VANCOCIN) IVPB 1000 mg/200 mL premix  Status:  Discontinued     1,000 mg 200 mL/hr over 60 Minutes Intravenous Every 8 hours 04/10/19 1320 04/12/19 1500   04/08/19 1200  cefTRIAXone (ROCEPHIN) 2 g in sodium chloride 0.9 % 100 mL IVPB     2 g 200 mL/hr over 30 Minutes Intravenous Every 12 hours 04/08/19 1013 06/09/19 2359   04/08/19 0530  vancomycin (VANCOCIN) IVPB 1000 mg/200 mL premix  Status:  Discontinued     1,000 mg 200 mL/hr over 60 Minutes Intravenous Every 12  hours 04/07/19 1541 04/10/19 1320   04/07/19 1900  Ampicillin-Sulbactam (UNASYN) 3 g in sodium chloride 0.9 % 100 mL IVPB  Status:  Discontinued     3 g 200 mL/hr over 30 Minutes Intravenous Every 6 hours 04/07/19 1457 04/08/19 1013   04/07/19 1730  vancomycin (VANCOREADY) IVPB 1500 mg/300 mL     1,500 mg 150 mL/hr over 120 Minutes Intravenous  Once 04/07/19 1541 04/07/19 1937   04/04/19 1400  ceFAZolin (ANCEF) IVPB 2g/100 mL premix  Status:  Discontinued     2 g 200 mL/hr over 30 Minutes Intravenous Every 8 hours 04/04/19 1119 04/07/19 1457   04/04/19 0400  Vancomycin (VANCOCIN) 1,250 mg in sodium chloride 0.9 %  250 mL IVPB  Status:  Discontinued     1,250 mg 166.7 mL/hr over 90 Minutes Intravenous Every 12 hours 04/03/19 1635 04/04/19 1159   04/02/19 1800  ceFEPIme (MAXIPIME) 2 g in sodium chloride 0.9 % 100 mL IVPB  Status:  Discontinued     2 g 200 mL/hr over 30 Minutes Intravenous Every 8 hours 04/02/19 1218 04/04/19 1127   04/02/19 1600  vancomycin (VANCOCIN) IVPB 1000 mg/200 mL premix  Status:  Discontinued     1,000 mg 200 mL/hr over 60 Minutes Intravenous Every 12 hours 04/02/19 0100 04/03/19 1635   04/02/19 0200  piperacillin-tazobactam (ZOSYN) IVPB 3.375 g  Status:  Discontinued     3.375 g 12.5 mL/hr over 240 Minutes Intravenous Every 8 hours 04/02/19 0059 04/02/19 1219   04/02/19 0200  vancomycin (VANCOCIN) 1,500 mg in sodium chloride 0.9 % 500 mL IVPB     1,500 mg 250 mL/hr over 120 Minutes Intravenous  Once 04/02/19 0100 04/02/19 0923   04/01/19 2040  ceFAZolin (ANCEF) 2-4 GM/100ML-% IVPB    Note to Pharmacy: Channing Mutters   : cabinet override      04/01/19 2040 04/02/19 0844        Subjective   Patient seen and examined at bedside no acute distress and resting comfortably.  No events overnight.  Tolerating diet.  Denies any chest pain, shortness of breath, fever, nausea, vomiting, urinary complaints.  Admits to having bowel movement.  Otherwise ROS  negative   Objective   Vitals:   05/23/19 2320 05/24/19 0455 05/24/19 0849 05/24/19 1205  BP: 100/66 96/66 93/64  95/65  Pulse: 100 (!) 101 69 81  Resp:  16 20 19   Temp: 98.4 F (36.9 C) 98.7 F (37.1 C) 98.4 F (36.9 C) 98 F (36.7 C)  TempSrc: Oral Oral Oral Oral  SpO2: 100% 100% 99% 99%  Weight:  79.3 kg    Height:        Intake/Output Summary (Last 24 hours) at 05/24/2019 1409 Last data filed at 05/24/2019 1300 Gross per 24 hour  Intake 480 ml  Output --  Net 480 ml   Filed Weights   05/22/19 0415 05/23/19 0448 05/24/19 0455  Weight: 79.8 kg 79.2 kg 79.3 kg    Examination:  Physical Exam Vitals and nursing note reviewed.  Constitutional:      Appearance: Normal appearance.  HENT:     Head: Normocephalic and atraumatic.  Eyes:     Conjunctiva/sclera: Conjunctivae normal.  Cardiovascular:     Rate and Rhythm: Normal rate and regular rhythm.     Comments: 2/6 systolic murmur HR 90s at bedside Pulmonary:     Effort: Pulmonary effort is normal.     Breath sounds: Normal breath sounds.  Abdominal:     General: Abdomen is flat.     Palpations: Abdomen is soft.  Musculoskeletal:        General: No swelling or tenderness.  Skin:    Coloration: Skin is not jaundiced or pale.  Neurological:     Mental Status: She is alert. Mental status is at baseline.  Psychiatric:        Mood and Affect: Mood normal.        Behavior: Behavior normal.     Data Reviewed: I have personally reviewed following labs and imaging studies  CBC: Recent Labs  Lab 05/20/19 0504  WBC 3.9*  NEUTROABS 1.3*  HGB 8.7*  HCT 29.0*  MCV 88.4  PLT 429*   Basic Metabolic Panel:  Recent Labs  Lab 05/20/19 0504  NA 138  K 4.3  CL 107  CO2 25  GLUCOSE 83  BUN 15  CREATININE 0.62  CALCIUM 8.6*  MG 1.8   GFR: Estimated Creatinine Clearance: 109.2 mL/min (by C-G formula based on SCr of 0.62 mg/dL). Liver Function Tests: No results for input(s): AST, ALT, ALKPHOS, BILITOT,  PROT, ALBUMIN in the last 168 hours. No results for input(s): LIPASE, AMYLASE in the last 168 hours. No results for input(s): AMMONIA in the last 168 hours. Coagulation Profile: No results for input(s): INR, PROTIME in the last 168 hours. Cardiac Enzymes: No results for input(s): CKTOTAL, CKMB, CKMBINDEX, TROPONINI in the last 168 hours. BNP (last 3 results) No results for input(s): PROBNP in the last 8760 hours. HbA1C: No results for input(s): HGBA1C in the last 72 hours. CBG: No results for input(s): GLUCAP in the last 168 hours. Lipid Profile: No results for input(s): CHOL, HDL, LDLCALC, TRIG, CHOLHDL, LDLDIRECT in the last 72 hours. Thyroid Function Tests: No results for input(s): TSH, T4TOTAL, FREET4, T3FREE, THYROIDAB in the last 72 hours. Anemia Panel: No results for input(s): VITAMINB12, FOLATE, FERRITIN, TIBC, IRON, RETICCTPCT in the last 72 hours. Sepsis Labs: No results for input(s): PROCALCITON, LATICACIDVEN in the last 168 hours.  No results found for this or any previous visit (from the past 240 hour(s)).       Radiology Studies: No results found.      Scheduled Meds: . aspirin EC  81 mg Oral Daily   Or  . aspirin  81 mg Per Tube Daily  . bisacodyl  10 mg Oral Daily   Or  . bisacodyl  10 mg Rectal Daily  . busPIRone  7.5 mg Oral BID  . Chlorhexidine Gluconate Cloth  6 each Topical Daily  . clopidogrel  75 mg Oral Daily  . [place] [facility], Patient & Family Education   Does not apply Once  . docusate sodium  200 mg Oral Daily  . escitalopram  10 mg Oral Daily  . feeding supplement (ENSURE ENLIVE)  237 mL Oral BID BM  . ferrous fumarate-b12-vitamic C-folic acid  1 capsule Oral BID PC  . furosemide  20 mg Oral Daily  . metoprolol tartrate  6.25 mg Oral BID  . pantoprazole  40 mg Oral Daily  . pneumococcal 23 valent vaccine  0.5 mL Intramuscular Tomorrow-1000  . potassium chloride  20 mEq Oral TID  . QUEtiapine  25 mg Oral QHS  . sodium  chloride flush  3 mL Intravenous Q12H   Continuous Infusions: . sodium chloride    . sodium chloride    . cefTRIAXone (ROCEPHIN)  IV 2 g (05/24/19 1208)  . lactated ringers Stopped (04/30/19 2141)  . vancomycin 1,250 mg (05/24/19 0950)     Time spent: 15 minutes with over 50% of the time coordinating the patient's care    Jae Dire, DO [facility] Pager [phone]  Call night coverage person covering after 7pm

## 2019-05-25 LAB — COMPREHENSIVE METABOLIC PANEL
ALT: 18 U/L (ref 0–44)
AST: 16 U/L (ref 15–41)
Albumin: 2.7 g/dL — ABNORMAL LOW (ref 3.5–5.0)
Alkaline Phosphatase: 67 U/L (ref 38–126)
Anion gap: 6 (ref 5–15)
BUN: 18 mg/dL (ref 6–20)
CO2: 23 mmol/L (ref 22–32)
Calcium: 8.6 mg/dL — ABNORMAL LOW (ref 8.9–10.3)
Chloride: 109 mmol/L (ref 98–111)
Creatinine, Ser: 0.64 mg/dL (ref 0.44–1.00)
GFR calc Af Amer: >60 mL/min (ref >60)
GFR calc non Af Amer: >60 mL/min (ref >60)
Glucose, Bld: 88 mg/dL (ref 70–99)
Potassium: 4.5 mmol/L (ref 3.5–5.1)
Sodium: 138 mmol/L (ref 135–145)
Total Bilirubin: 0.3 mg/dL (ref 0.3–1.2)
Total Protein: 5.8 g/dL — ABNORMAL LOW (ref 6.5–8.1)

## 2019-05-25 LAB — CBC
HCT: 31.3 % — ABNORMAL LOW (ref 36.0–46.0)
Hemoglobin: 9.5 g/dL — ABNORMAL LOW (ref 12.0–15.0)
MCH: 26.8 pg (ref 26.0–34.0)
MCHC: 30.4 g/dL (ref 30.0–36.0)
MCV: 88.4 fL (ref 80.0–100.0)
Platelets: 364 10*3/uL (ref 150–400)
RBC: 3.54 MIL/uL — ABNORMAL LOW (ref 3.87–5.11)
RDW: 16.3 % — ABNORMAL HIGH (ref 11.5–15.5)
WBC: 4.6 10*3/uL (ref 4.0–10.5)
nRBC: 0 % (ref 0.0–0.2)

## 2019-05-25 NOTE — Progress Notes (Addendum)
[address]       [place] [address]             [phone]      27 Days Post-Op Procedure(s) (LRB): AORTIC VALVE REPLACEMENT (AVR) using Edwards PERIMOUNT Magna Ease Pericardial Bioprosthesis - 29 MM Aortic Valve. (N/A) REPAIR OF TRICUSPID (N/A) TRANSESOPHAGEAL ECHOCARDIOGRAM (TEE) (N/A) Subjective: Feels about the same, well overall  Objective: Vital signs in last 24 hours: Temp:  [98 F (36.7 C)-98.4 F (36.9 C)] 98.4 F (36.9 C) (05/17 0513) Pulse Rate:  [69-98] 87 (05/17 0513) Cardiac Rhythm: Normal sinus rhythm (05/17 0513) Resp:  [18-20] 18 (05/17 0513) BP: (93-101)/(52-75) 98/72 (05/17 0513) SpO2:  [96 %-100 %] 96 % (05/17 0513) Weight:  [79.2 kg] 79.2 kg (05/17 0513)  Hemodynamic parameters for last 24 hours:    Intake/Output from previous day: 05/16 0701 - 05/17 0700 In: 360 [P.O.:360] Out: -  Intake/Output this shift: No intake/output data recorded.  General appearance: alert, cooperative and no distress Heart: regular rate and rhythm Lungs: clear to auscultation bilaterally Abdomen: benign Extremities: no edema Wound: incis healing well  Lab Results: Recent Labs    05/25/19 0500  WBC 4.6  HGB 9.5*  HCT 31.3*  PLT 364   BMET:  Recent Labs    05/25/19 0500  NA 138  K 4.5  CL 109  CO2 23  GLUCOSE 88  BUN 18  CREATININE 0.64  CALCIUM 8.6*    PT/INR: No results for input(s): LABPROT, INR in the last 72 hours. ABG    Component Value Date/Time   PHART 7.365 04/28/2019 2205   HCO3 24.5 04/28/2019 2205   TCO2 26 04/28/2019 2205   ACIDBASEDEF 1.0 04/28/2019 2205   O2SAT 97.0 04/28/2019 2205   CBG (last 3)  No results for input(s): GLUCAP in the last 72 hours.  Meds Scheduled Meds: . aspirin EC  81 mg Oral Daily   Or  . aspirin  81 mg Per Tube Daily  . bisacodyl  10 mg Oral Daily   Or  . bisacodyl  10 mg Rectal Daily  . busPIRone  7.5 mg Oral BID  . Chlorhexidine Gluconate Cloth  6 each Topical Daily    . clopidogrel  75 mg Oral Daily  . [place] [facility], Patient & Family Education   Does not apply Once  . docusate sodium  200 mg Oral Daily  . escitalopram  10 mg Oral Daily  . feeding supplement (ENSURE ENLIVE)  237 mL Oral BID BM  . ferrous [id number] C-folic acid  1 capsule Oral BID PC  . furosemide  20 mg Oral Daily  . metoprolol tartrate  6.25 mg Oral BID  . pantoprazole  40 mg Oral Daily  . pneumococcal 23 valent vaccine  0.5 mL Intramuscular Tomorrow-1000  . potassium chloride  20 mEq Oral TID  . QUEtiapine  25 mg Oral QHS  . sodium chloride flush  3 mL Intravenous Q12H   Continuous Infusions: . sodium chloride    . sodium chloride    . cefTRIAXone (ROCEPHIN)  IV 2 g (05/24/19 2317)  . lactated ringers Stopped (04/30/19 2141)  . vancomycin 1,250 mg (05/24/19 2144)   PRN Meds:.sodium chloride, acetaminophen, diphenhydrAMINE, ondansetron (ZOFRAN) IV, oxyCODONE, polyethylene glycol, sodium chloride flush  Xrays No results found.  Assessment/Plan: S/P Procedure(s) (LRB): AORTIC VALVE REPLACEMENT (AVR) using [place] PERIMOUNT Magna Ease Pericardial Bioprosthesis - 29 MM Aortic Valve. (N/A) REPAIR OF TRICUSPID (N/A) TRANSESOPHAGEAL ECHOCARDIOGRAM (  TEE) (N/A)  1 remains quite stable overall 2 afeb , stable Vitals, BP a little soft but asymptomatic 3 sats good on RA 4 no leukocytosis 5 H/H improving trend 6 normal renal fxn, nl LFT's 7 conts current ABX, medical management as per primary  LOS: 54 days    Rowe Clack PA-C Pager [phone] 05/25/2019  I have seen and examined Ashiya Wanjiku Kruger and agree with the above assessment  and plan.  Delight Ovens MD Beeper [phone] Office [phone] 05/25/2019 5:19 PM

## 2019-05-25 NOTE — Progress Notes (Signed)
Mobility Specialist: Progress Note    05/25/19 1532  Mobility  Activity Ambulated in hall  Level of Assistance Independent  Assistive Device None  Distance Ambulated (ft) 2000 ft  Mobility Response Tolerated well  Mobility performed by Mobility specialist  $Mobility charge 1 Mobility   Pt tolerated ambulation well.   [facility] Day Mobility Specialist

## 2019-05-25 NOTE — Progress Notes (Signed)
PROGRESS NOTE    Lauren Reyes    Code Status: Full Code  FAO:[id number] DOB: June 16, 1990 DOA: 04/01/2019 LOS: 54 days  PCP: Patient, No Pcp Per CC:  Chief Complaint  Patient presents with  . Code Stroke       Hospital Summary   This is a 29 year old female with past medical history of substance abuse with heroin, crystal meth and cocaine who presented to the ER with aphasia and right-sided weakness and had a code stroke subsequently called.  She was admitted to the neurology ICU on 04/01/2019.  She was intubated and underwent thrombectomy for left MCA M3 occlusion and recanalization.  This was unfortunately complicated by dissection and pseudoaneurysm of the left ICA, s/p stent placement.  She was extubated on 04/04/2019.  She was found to have a right common femoral vein DVT along with right groin hematoma which was treated with IVC filter placement.  Patient had a TEE which showed infective endocarditis and CT surgery was consulted.  She is status post bioprosthetic aortic valve replacement and tricuspid valve repair on 04/28/2019 and is on IV antibiotics. CT surgery on board, requested TRH takeover service on 5/6.    A & P   Principal Problem:   Endocarditis Active Problems:   Acute ischemic left MCA stroke (HCC)   Middle cerebral artery embolism, left   DVT (deep venous thrombosis) (HCC)   Encounter for central line placement   Endotracheal tube present   Cerebrovascular accident (CVA) due to embolism of precerebral artery (HCC)   Infective endocarditis   Normocytic anemia   Polysubstance abuse (HCC)   IVDU (intravenous drug user)   Tachypnea   1. Culture negative aortic and tricuspid valve endocarditis s/p AV replacement and TV repair 05/04/2019 with Dr. Tyrone Sage a. Currently on IV Rocephin and vancomycin.  will need to remain hospitalized until completion of 6 weeks of therapy on 06/08/2019 due to drug use b. CT surgery on board c. continue metoprolol and  Lasix  2. Acute left-sided MCA embolic stroke s/p IR revascularization (3/24) a. Complicated by dissection and pseudoaneurysm of left ICA s/p placement of stent b. Initially on aspirin/Brilinta however Brilinta held for CT surgery c. Now on aspirin and Plavix dual antiplatelet for total 3 to 6 months d. will need follow up with Dr. Corliss Skains to decide on exact regimen and require outpatient neurology follow up  3. Right common femoral DVT with right groin hematoma s/p IVC filter placement 04/03/2019 a. Not a candidate for anticoagulation given septic emboli, dissection and hematoma as well as dual antiplatelets b. Revisit IVC filter removal plan with IR prior to discharge   4. Acute blood loss anemia, iron deficiency anemia, B12 deficiency and folate deficiency, stable a. Continue supplementation  5. Polysubstance abuse: THC, amphetamine and suspected IV drug use a. Currently stable without any signs of withdrawal b. Continue to encourage illicit drug abstinence at discharge  6. Anxiety, stable on BuSpar, Lexapro and Seroquel  7. Pain control a. Taper oxycodone to daily as needed  8. Sinus tachycardia with soft BP, likely baseline   DVT prophylaxis: Heparin Family Communication: Patient updated at bedside.  Wishes to update family herself Disposition Plan:  Status is: Inpatient  Remains inpatient appropriate because:Unsafe discharge plan   Dispo: The patient is from: Home              Anticipated d/c is to: Home              Anticipated d/c date is: >  3 days              Patient currently is not medically stable to d/c.      Pressure injury documentation    None  Consultants  CT surgery Neurology  Infectious disease  Procedures  IR guided left MCA thrombectomy on 3/24 AV replacement and TV repair on 4/20   Antibiotics   Anti-infectives (From admission, onward)   Start     Dose/Rate Route Frequency Ordered Stop   05/22/19 2100  vancomycin (VANCOREADY) IVPB  1250 mg/250 mL     1,250 mg 166.7 mL/hr over 90 Minutes Intravenous Every 12 hours 05/22/19 0937     05/19/19 2100  vancomycin (VANCOCIN) IVPB 1000 mg/200 mL premix  Status:  Discontinued     1,000 mg 200 mL/hr over 60 Minutes Intravenous Every 12 hours 05/19/19 1040 05/22/19 0937   05/03/19 1000  fluconazole (DIFLUCAN) tablet 150 mg     150 mg Oral  Once 05/03/19 0804 05/03/19 1243   04/28/19 0400  vancomycin (VANCOREADY) IVPB 1250 mg/250 mL  Status:  Discontinued     1,250 mg 166.7 mL/hr over 90 Minutes Intravenous To Surgery 04/27/19 1131 04/28/19 1451   04/28/19 0400  cefUROXime (ZINACEF) 1.5 g in sodium chloride 0.9 % 100 mL IVPB     1.5 g 200 mL/hr over 30 Minutes Intravenous To Surgery 04/27/19 1131 04/28/19 0819   04/28/19 0400  cefUROXime (ZINACEF) 750 mg in sodium chloride 0.9 % 100 mL IVPB     750 mg 200 mL/hr over 30 Minutes Intravenous To Surgery 04/27/19 1131 04/28/19 1313   04/15/19 0900  vancomycin (VANCOREADY) IVPB 750 mg/150 mL  Status:  Discontinued     750 mg 150 mL/hr over 60 Minutes Intravenous Every 8 hours 04/15/19 0821 05/19/19 1040   04/13/19 1400  vancomycin (VANCOCIN) IVPB 1000 mg/200 mL premix  Status:  Discontinued     1,000 mg 200 mL/hr over 60 Minutes Intravenous Every 8 hours 04/13/19 0839 04/15/19 0821   04/12/19 2200  vancomycin (VANCOREADY) IVPB 750 mg/150 mL  Status:  Discontinued     750 mg 150 mL/hr over 60 Minutes Intravenous Every 8 hours 04/12/19 1500 04/13/19 0839   04/10/19 1336  vancomycin (VANCOCIN) IVPB 1000 mg/200 mL premix  Status:  Discontinued     1,000 mg 200 mL/hr over 60 Minutes Intravenous Every 8 hours 04/10/19 1320 04/12/19 1500   04/08/19 1200  cefTRIAXone (ROCEPHIN) 2 g in sodium chloride 0.9 % 100 mL IVPB     2 g 200 mL/hr over 30 Minutes Intravenous Every 12 hours 04/08/19 1013 06/09/19 2359   04/08/19 0530  vancomycin (VANCOCIN) IVPB 1000 mg/200 mL premix  Status:  Discontinued     1,000 mg 200 mL/hr over 60 Minutes  Intravenous Every 12 hours 04/07/19 1541 04/10/19 1320   04/07/19 1900  Ampicillin-Sulbactam (UNASYN) 3 g in sodium chloride 0.9 % 100 mL IVPB  Status:  Discontinued     3 g 200 mL/hr over 30 Minutes Intravenous Every 6 hours 04/07/19 1457 04/08/19 1013   04/07/19 1730  vancomycin (VANCOREADY) IVPB 1500 mg/300 mL     1,500 mg 150 mL/hr over 120 Minutes Intravenous  Once 04/07/19 1541 04/07/19 1937   04/04/19 1400  ceFAZolin (ANCEF) IVPB 2g/100 mL premix  Status:  Discontinued     2 g 200 mL/hr over 30 Minutes Intravenous Every 8 hours 04/04/19 1119 04/07/19 1457   04/04/19 0400  Vancomycin (VANCOCIN) 1,250 mg in sodium chloride 0.9 %  250 mL IVPB  Status:  Discontinued     1,250 mg 166.7 mL/hr over 90 Minutes Intravenous Every 12 hours 04/03/19 1635 04/04/19 1159   04/02/19 1800  ceFEPIme (MAXIPIME) 2 g in sodium chloride 0.9 % 100 mL IVPB  Status:  Discontinued     2 g 200 mL/hr over 30 Minutes Intravenous Every 8 hours 04/02/19 1218 04/04/19 1127   04/02/19 1600  vancomycin (VANCOCIN) IVPB 1000 mg/200 mL premix  Status:  Discontinued     1,000 mg 200 mL/hr over 60 Minutes Intravenous Every 12 hours 04/02/19 0100 04/03/19 1635   04/02/19 0200  piperacillin-tazobactam (ZOSYN) IVPB 3.375 g  Status:  Discontinued     3.375 g 12.5 mL/hr over 240 Minutes Intravenous Every 8 hours 04/02/19 0059 04/02/19 1219   04/02/19 0200  vancomycin (VANCOCIN) 1,500 mg in sodium chloride 0.9 % 500 mL IVPB     1,500 mg 250 mL/hr over 120 Minutes Intravenous  Once 04/02/19 0100 04/02/19 0923   04/01/19 2040  ceFAZolin (ANCEF) 2-4 GM/100ML-% IVPB    Note to Pharmacy: Channing Mutters   : cabinet override      04/01/19 2040 04/02/19 0844        Subjective   Denies any complaints, no overnight events or acute issues  Objective   Vitals:   05/24/19 2305 05/25/19 0513 05/25/19 0735 05/25/19 1217  BP: 96/67 98/72 98/73  98/72  Pulse: 98 87 97 94  Resp:  18  17  Temp: 98.2 F (36.8 C) 98.4 F (36.9 C)  98 F (36.7 C) 98.1 F (36.7 C)  TempSrc: Oral Oral Oral Oral  SpO2: 100% 96% 100% 100%  Weight:  79.2 kg    Height:       No intake or output data in the 24 hours ending 05/25/19 1345 Filed Weights   05/23/19 0448 05/24/19 0455 05/25/19 0513  Weight: 79.2 kg 79.3 kg 79.2 kg    Examination:  Physical Exam Vitals and nursing note reviewed.  Constitutional:      Appearance: Normal appearance.  HENT:     Head: Normocephalic and atraumatic.  Eyes:     Conjunctiva/sclera: Conjunctivae normal.  Cardiovascular:     Rate and Rhythm: Normal rate and regular rhythm.  Pulmonary:     Effort: Pulmonary effort is normal.     Breath sounds: Normal breath sounds.  Abdominal:     General: Abdomen is flat.     Palpations: Abdomen is soft.  Musculoskeletal:        General: No swelling.  Neurological:     Mental Status: She is alert. Mental status is at baseline.  Psychiatric:        Mood and Affect: Mood normal.        Behavior: Behavior normal.     Data Reviewed: I have personally reviewed following labs and imaging studies  CBC: Recent Labs  Lab 05/20/19 0504 05/25/19 0500  WBC 3.9* 4.6  NEUTROABS 1.3*  --   HGB 8.7* 9.5*  HCT 29.0* 31.3*  MCV 88.4 88.4  PLT 429* 364   Basic Metabolic Panel: Recent Labs  Lab 05/20/19 0504 05/25/19 0500  NA 138 138  K 4.3 4.5  CL 107 109  CO2 25 23  GLUCOSE 83 88  BUN 15 18  CREATININE 0.62 0.64  CALCIUM 8.6* 8.6*  MG 1.8  --    GFR: Estimated Creatinine Clearance: 109.2 mL/min (by C-G formula based on SCr of 0.64 mg/dL). Liver Function Tests: Recent Labs  Lab  05/25/19 0500  AST 16  ALT 18  ALKPHOS 67  BILITOT 0.3  PROT 5.8*  ALBUMIN 2.7*   No results for input(s): LIPASE, AMYLASE in the last 168 hours. No results for input(s): AMMONIA in the last 168 hours. Coagulation Profile: No results for input(s): INR, PROTIME in the last 168 hours. Cardiac Enzymes: No results for input(s): CKTOTAL, CKMB, CKMBINDEX,  TROPONINI in the last 168 hours. BNP (last 3 results) No results for input(s): PROBNP in the last 8760 hours. HbA1C: No results for input(s): HGBA1C in the last 72 hours. CBG: No results for input(s): GLUCAP in the last 168 hours. Lipid Profile: No results for input(s): CHOL, HDL, LDLCALC, TRIG, CHOLHDL, LDLDIRECT in the last 72 hours. Thyroid Function Tests: No results for input(s): TSH, T4TOTAL, FREET4, T3FREE, THYROIDAB in the last 72 hours. Anemia Panel: No results for input(s): VITAMINB12, FOLATE, FERRITIN, TIBC, IRON, RETICCTPCT in the last 72 hours. Sepsis Labs: No results for input(s): PROCALCITON, LATICACIDVEN in the last 168 hours.  No results found for this or any previous visit (from the past 240 hour(s)).       Radiology Studies: No results found.      Scheduled Meds: . aspirin EC  81 mg Oral Daily   Or  . aspirin  81 mg Per Tube Daily  . bisacodyl  10 mg Oral Daily   Or  . bisacodyl  10 mg Rectal Daily  . busPIRone  7.5 mg Oral BID  . Chlorhexidine Gluconate Cloth  6 each Topical Daily  . clopidogrel  75 mg Oral Daily  . [place] [facility], Patient [facility] Family Education   Does not apply Once  . docusate sodium  200 mg Oral Daily  . escitalopram  10 mg Oral Daily  . feeding supplement (ENSURE ENLIVE)  237 mL Oral BID BM  . ferrous fumarate-b12-vitamic C-folic acid  1 capsule Oral BID PC  . furosemide  20 mg Oral Daily  . metoprolol tartrate  6.25 mg Oral BID  . pantoprazole  40 mg Oral Daily  . pneumococcal 23 valent vaccine  0.5 mL Intramuscular Tomorrow-1000  . potassium chloride  20 mEq Oral TID  . QUEtiapine  25 mg Oral QHS  . sodium chloride flush  3 mL Intravenous Q12H   Continuous Infusions: . sodium chloride    . sodium chloride    . cefTRIAXone (ROCEPHIN)  IV 2 g (05/25/19 1044)  . lactated ringers Stopped (04/30/19 2141)  . vancomycin 1,250 mg (05/25/19 0857)     Time spent: 15 minutes with over 50% of the time coordinating the  patient's care    Jae Dire, DO [facility] Pager [phone]  Call night coverage person covering after 7pm

## 2019-05-25 NOTE — Progress Notes (Signed)
Patient ambulated in hallway independently this am. Will monitor patient. Mahayla Haddaway, Randall An RN

## 2019-05-26 NOTE — Progress Notes (Addendum)
      [address]       [place] [address]             [phone]      28 Days Post-Op Procedure(s) (LRB): AORTIC VALVE REPLACEMENT (AVR) using Edwards PERIMOUNT Magna Ease Pericardial Bioprosthesis - 29 MM Aortic Valve. (N/A) REPAIR OF TRICUSPID (N/A) TRANSESOPHAGEAL ECHOCARDIOGRAM (TEE) (N/A) Subjective: Feels okay this morning. No complaints.   Objective: Vital signs in last 24 hours: Temp:  [97.5 F (36.4 C)-98.5 F (36.9 C)] 98.5 F (36.9 C) (05/18 0614) Pulse Rate:  [85-100] 85 (05/18 0614) Cardiac Rhythm: Sinus tachycardia (05/17 1908) Resp:  [17-20] 20 (05/18 0614) BP: (90-108)/(55-80) 90/55 (05/18 0614) SpO2:  [97 %-100 %] 98 % (05/18 0614) Weight:  [79.9 kg] 79.9 kg (05/18 0600)     Intake/Output from previous day: 05/17 0701 - 05/18 0700 In: 4288.5 [P.O.:440; IV Piggyback:3848.5] Out: -  Intake/Output this shift: No intake/output data recorded.  General appearance: alert, cooperative and no distress Heart: regular rate and rhythm, S1, S2 normal, no murmur, click, rub or gallop Lungs: clear to auscultation bilaterally Abdomen: soft, non-tender; bowel sounds normal; no masses,  no organomegaly Extremities: extremities normal, atraumatic, no cyanosis or edema Wound: clean and dry, well healed  Lab Results: Recent Labs    05/25/19 0500  WBC 4.6  HGB 9.5*  HCT 31.3*  PLT 364   BMET:  Recent Labs    05/25/19 0500  NA 138  K 4.5  CL 109  CO2 23  GLUCOSE 88  BUN 18  CREATININE 0.64  CALCIUM 8.6*    PT/INR: No results for input(s): LABPROT, INR in the last 72 hours. ABG    Component Value Date/Time   PHART 7.365 04/28/2019 2205   HCO3 24.5 04/28/2019 2205   TCO2 26 04/28/2019 2205   ACIDBASEDEF 1.0 04/28/2019 2205   O2SAT 97.0 04/28/2019 2205   CBG (last 3)  No results for input(s): GLUCAP in the last 72 hours.  Assessment/Plan: S/P Procedure(s) (LRB): AORTIC VALVE REPLACEMENT (AVR) using Edwards PERIMOUNT Magna  Ease Pericardial Bioprosthesis - 29 MM Aortic Valve. (N/A) REPAIR OF TRICUSPID (N/A) TRANSESOPHAGEAL ECHOCARDIOGRAM (TEE) (N/A)  1. Remains stable. BP on the low end at times but patient is not symptomatic. NSR in the 80s.  2. Pulm-no acute issues 3. Creatinine 0.64, electrolytes okay.  4. H and H 9.5/31.3, expected acute blood loss anemia.  5. Endo-blood glucose well controlled. Eating well.  6. Endocarditis-continue IV antibiotics until 5/31   Plan: Continue ambulation several times a day. Continue IV antibiotics. Appreciate care management's assistance with discharge plan.    LOS: 55 days    [name] 05/26/2019  I have seen and examined Lauren Reyes and agree with the above assessment  and plan.  Delight Ovens MD [facility] [phone] Office [phone] 05/26/2019 4:32 PM

## 2019-05-26 NOTE — Progress Notes (Signed)
PROGRESS NOTE    Lauren Reyes    Code Status: Full Code  LOV:[id number] DOB: 14-Nov-1990 DOA: 04/01/2019 LOS: 55 days  PCP: Patient, No Pcp Per CC:  Chief Complaint  Patient presents with  . Code Stroke       Hospital Summary   This is a very pleasant 29 year old female with past medical history of substance abuse with heroin, crystal meth and cocaine who presented to the ER with aphasia and right-sided weakness and had a code stroke subsequently called.  She was admitted to the neurology ICU on 04/01/2019.  She was intubated and underwent thrombectomy for left MCA M3 occlusion and recanalization.  This was unfortunately complicated by dissection and pseudoaneurysm of the left ICA, s/p stent placement.  She was extubated on 04/04/2019.  She was found to have a right common femoral vein DVT along with right groin hematoma which was treated with IVC filter placement.  Patient had a TEE which showed infective endocarditis and CT surgery was consulted.  She is status post bioprosthetic aortic valve replacement and tricuspid valve repair on 04/28/2019 and is on IV antibiotics. CT surgery on board, requested [place] takeover service on 5/6.  Plan to complete 6 weeks IV antibiotics while inpatient, last dose 5/31.    A & P   Principal Problem:   Endocarditis Active Problems:   Acute ischemic left MCA stroke (HCC)   Middle cerebral artery embolism, left   DVT (deep venous thrombosis) (HCC)   Encounter for central line placement   Endotracheal tube present   Cerebrovascular accident (CVA) due to embolism of precerebral artery (HCC)   Infective endocarditis   Normocytic anemia   Polysubstance abuse ([place])   IVDU (intravenous drug user)   Tachypnea   1. Culture negative aortic and tricuspid valve endocarditis s/p AV replacement and TV repair 05/04/2019 with Dr. Servando Snare a. Currently on IV Rocephin and vancomycin.  will need to remain hospitalized until completion of 6 weeks of therapy on  06/08/2019 due to drug use b. CT surgery on board c. continue metoprolol and Lasix  2. Acute left-sided MCA embolic stroke s/p IR revascularization (3/24) a. Complicated by dissection and pseudoaneurysm of left ICA s/p placement of stent b. Initially on aspirin/Brilinta however Brilinta held for CT surgery c. Now on aspirin and Plavix dual antiplatelet for total 3 to 6 months d. will need follow up with Dr. Estanislado Pandy to decide on exact regimen and require outpatient neurology follow up  3. Right common femoral DVT with right groin hematoma s/p IVC filter placement 04/03/2019 a. Not a candidate for anticoagulation given septic emboli, dissection and hematoma as well as dual antiplatelets b. Revisit IVC filter removal plan with IR prior to discharge   4. Acute blood loss anemia, iron deficiency anemia, B12 deficiency and folate deficiency, stable a. Continue supplementation  5. Polysubstance abuse: THC, amphetamine and suspected IV drug use a. Currently stable without any signs of withdrawal b. Continue to encourage illicit drug abstinence at discharge  6. Anxiety, stable on BuSpar, Lexapro and Seroquel  7. Pain control a. Taper oxycodone to daily as needed  8. Sinus tachycardia with soft BP, likely baseline   DVT prophylaxis: Heparin Family Communication: Patient updated at bedside.  Wishes to update family herself Disposition Plan:  Status is: Inpatient  Remains inpatient appropriate because:Unsafe discharge plan   Dispo: The patient is from: Home              Anticipated d/c is to: Home  Anticipated d/c date is: > 3 days              Patient currently is not medically stable to d/c.      Pressure injury documentation    None  Consultants  CT surgery Neurology  Infectious disease  Procedures  IR guided left MCA thrombectomy on 3/24 AV replacement and TV repair on 4/20   Antibiotics   Anti-infectives (From admission, onward)   Start     Dose/Rate  Route Frequency Ordered Stop   05/22/19 2100  vancomycin (VANCOREADY) IVPB 1250 mg/250 mL     1,250 mg 166.7 mL/hr over 90 Minutes Intravenous Every 12 hours 05/22/19 0937     05/19/19 2100  vancomycin (VANCOCIN) IVPB 1000 mg/200 mL premix  Status:  Discontinued     1,000 mg 200 mL/hr over 60 Minutes Intravenous Every 12 hours 05/19/19 1040 05/22/19 0937   05/03/19 1000  fluconazole (DIFLUCAN) tablet 150 mg     150 mg Oral  Once 05/03/19 0804 05/03/19 1243   04/28/19 0400  vancomycin (VANCOREADY) IVPB 1250 mg/250 mL  Status:  Discontinued     1,250 mg 166.7 mL/hr over 90 Minutes Intravenous To Surgery 04/27/19 1131 04/28/19 1451   04/28/19 0400  cefUROXime (ZINACEF) 1.5 g in sodium chloride 0.9 % 100 mL IVPB     1.5 g 200 mL/hr over 30 Minutes Intravenous To Surgery 04/27/19 1131 04/28/19 0819   04/28/19 0400  cefUROXime (ZINACEF) 750 mg in sodium chloride 0.9 % 100 mL IVPB     750 mg 200 mL/hr over 30 Minutes Intravenous To Surgery 04/27/19 1131 04/28/19 1313   04/15/19 0900  vancomycin (VANCOREADY) IVPB 750 mg/150 mL  Status:  Discontinued     750 mg 150 mL/hr over 60 Minutes Intravenous Every 8 hours 04/15/19 0821 05/19/19 1040   04/13/19 1400  vancomycin (VANCOCIN) IVPB 1000 mg/200 mL premix  Status:  Discontinued     1,000 mg 200 mL/hr over 60 Minutes Intravenous Every 8 hours 04/13/19 0839 04/15/19 0821   04/12/19 2200  vancomycin (VANCOREADY) IVPB 750 mg/150 mL  Status:  Discontinued     750 mg 150 mL/hr over 60 Minutes Intravenous Every 8 hours 04/12/19 1500 04/13/19 0839   04/10/19 1336  vancomycin (VANCOCIN) IVPB 1000 mg/200 mL premix  Status:  Discontinued     1,000 mg 200 mL/hr over 60 Minutes Intravenous Every 8 hours 04/10/19 1320 04/12/19 1500   04/08/19 1200  cefTRIAXone (ROCEPHIN) 2 g in sodium chloride 0.9 % 100 mL IVPB     2 g 200 mL/hr over 30 Minutes Intravenous Every 12 hours 04/08/19 1013 06/09/19 2359   04/08/19 0530  vancomycin (VANCOCIN) IVPB 1000 mg/200 mL  premix  Status:  Discontinued     1,000 mg 200 mL/hr over 60 Minutes Intravenous Every 12 hours 04/07/19 1541 04/10/19 1320   04/07/19 1900  Ampicillin-Sulbactam (UNASYN) 3 g in sodium chloride 0.9 % 100 mL IVPB  Status:  Discontinued     3 g 200 mL/hr over 30 Minutes Intravenous Every 6 hours 04/07/19 1457 04/08/19 1013   04/07/19 1730  vancomycin (VANCOREADY) IVPB 1500 mg/300 mL     1,500 mg 150 mL/hr over 120 Minutes Intravenous  Once 04/07/19 1541 04/07/19 1937   04/04/19 1400  ceFAZolin (ANCEF) IVPB 2g/100 mL premix  Status:  Discontinued     2 g 200 mL/hr over 30 Minutes Intravenous Every 8 hours 04/04/19 1119 04/07/19 1457   04/04/19 0400  Vancomycin (VANCOCIN) 1,250  mg in sodium chloride 0.9 % 250 mL IVPB  Status:  Discontinued     1,250 mg 166.7 mL/hr over 90 Minutes Intravenous Every 12 hours 04/03/19 1635 04/04/19 1159   04/02/19 1800  ceFEPIme (MAXIPIME) 2 g in sodium chloride 0.9 % 100 mL IVPB  Status:  Discontinued     2 g 200 mL/hr over 30 Minutes Intravenous Every 8 hours 04/02/19 1218 04/04/19 1127   04/02/19 1600  vancomycin (VANCOCIN) IVPB 1000 mg/200 mL premix  Status:  Discontinued     1,000 mg 200 mL/hr over 60 Minutes Intravenous Every 12 hours 04/02/19 0100 04/03/19 1635   04/02/19 0200  piperacillin-tazobactam (ZOSYN) IVPB 3.375 g  Status:  Discontinued     3.375 g 12.5 mL/hr over 240 Minutes Intravenous Every 8 hours 04/02/19 0059 04/02/19 1219   04/02/19 0200  vancomycin (VANCOCIN) 1,500 mg in sodium chloride 0.9 % 500 mL IVPB     1,500 mg 250 mL/hr over 120 Minutes Intravenous  Once 04/02/19 0100 04/02/19 0923   04/01/19 2040  ceFAZolin (ANCEF) 2-4 GM/100ML-% IVPB    Note to Pharmacy: Channing Mutters   : cabinet override      04/01/19 2040 04/02/19 0844        Subjective   Doing well, no complaints, no overnight events  Objective   Vitals:   05/26/19 0600 05/26/19 0614 05/26/19 0900 05/26/19 0903  BP:  (!) 90/55 104/60   Pulse:  85 93 (!) 102   Resp:  20 18   Temp:  98.5 F (36.9 C) 97.6 F (36.4 C)   TempSrc:  Oral Oral   SpO2:  98% 100%   Weight: 79.9 kg     Height:        Intake/Output Summary (Last 24 hours) at 05/26/2019 1137 Last data filed at 05/26/2019 0958 Gross per 24 hour  Intake 4288.46 ml  Output -  Net 4288.46 ml   Filed Weights   05/24/19 0455 05/25/19 0513 05/26/19 0600  Weight: 79.3 kg 79.2 kg 79.9 kg    Examination:  Physical Exam Vitals and nursing note reviewed.  Constitutional:      Appearance: Normal appearance.  HENT:     Head: Normocephalic and atraumatic.  Eyes:     Conjunctiva/sclera: Conjunctivae normal.  Cardiovascular:     Rate and Rhythm: Normal rate and regular rhythm.  Pulmonary:     Effort: Pulmonary effort is normal.     Breath sounds: Normal breath sounds.  Abdominal:     General: Abdomen is flat.     Palpations: Abdomen is soft.  Musculoskeletal:        General: No swelling or tenderness.  Skin:    Coloration: Skin is not jaundiced or pale.  Neurological:     Mental Status: She is alert. Mental status is at baseline.  Psychiatric:        Mood and Affect: Mood normal.        Behavior: Behavior normal.     Data Reviewed: I have personally reviewed following labs and imaging studies  CBC: Recent Labs  Lab 05/20/19 0504 05/25/19 0500  WBC 3.9* 4.6  NEUTROABS 1.3*  --   HGB 8.7* 9.5*  HCT 29.0* 31.3*  MCV 88.4 88.4  PLT 429* 364   Basic Metabolic Panel: Recent Labs  Lab 05/20/19 0504 05/25/19 0500  NA 138 138  K 4.3 4.5  CL 107 109  CO2 25 23  GLUCOSE 83 88  BUN 15 18  CREATININE 0.62 0.64  CALCIUM 8.6* 8.6*  MG 1.8  --    GFR: Estimated Creatinine Clearance: 109.6 mL/min (by C-G formula based on SCr of 0.64 mg/dL). Liver Function Tests: Recent Labs  Lab 05/25/19 0500  AST 16  ALT 18  ALKPHOS 67  BILITOT 0.3  PROT 5.8*  ALBUMIN 2.7*   No results for input(s): LIPASE, AMYLASE in the last 168 hours. No results for input(s): AMMONIA in  the last 168 hours. Coagulation Profile: No results for input(s): INR, PROTIME in the last 168 hours. Cardiac Enzymes: No results for input(s): CKTOTAL, CKMB, CKMBINDEX, TROPONINI in the last 168 hours. BNP (last 3 results) No results for input(s): PROBNP in the last 8760 hours. HbA1C: No results for input(s): HGBA1C in the last 72 hours. CBG: No results for input(s): GLUCAP in the last 168 hours. Lipid Profile: No results for input(s): CHOL, HDL, LDLCALC, TRIG, CHOLHDL, LDLDIRECT in the last 72 hours. Thyroid Function Tests: No results for input(s): TSH, T4TOTAL, FREET4, T3FREE, THYROIDAB in the last 72 hours. Anemia Panel: No results for input(s): VITAMINB12, FOLATE, FERRITIN, TIBC, IRON, RETICCTPCT in the last 72 hours. Sepsis Labs: No results for input(s): PROCALCITON, LATICACIDVEN in the last 168 hours.  No results found for this or any previous visit (from the past 240 hour(s)).       Radiology Studies: No results found.      Scheduled Meds: . aspirin EC  81 mg Oral Daily   Or  . aspirin  81 mg Per Tube Daily  . bisacodyl  10 mg Oral Daily   Or  . bisacodyl  10 mg Rectal Daily  . busPIRone  7.5 mg Oral BID  . Chlorhexidine Gluconate Cloth  6 each Topical Daily  . clopidogrel  75 mg Oral Daily  . [facility] Patient [facility] Family Education   Does not apply Once  . docusate sodium  200 mg Oral Daily  . escitalopram  10 mg Oral Daily  . feeding supplement (ENSURE ENLIVE)  237 mL Oral BID BM  . ferrous fumarate-b12-vitamic C-folic acid  1 capsule Oral BID PC  . furosemide  20 mg Oral Daily  . metoprolol tartrate  6.25 mg Oral BID  . pantoprazole  40 mg Oral Daily  . pneumococcal 23 valent vaccine  0.5 mL Intramuscular Tomorrow-1000  . potassium chloride  20 mEq Oral TID  . QUEtiapine  25 mg Oral QHS  . sodium chloride flush  3 mL Intravenous Q12H   Continuous Infusions: . sodium chloride    . sodium chloride    . cefTRIAXone (ROCEPHIN)  IV 2 g  (05/25/19 2348)  . lactated ringers Stopped (04/30/19 2141)  . vancomycin 1,250 mg (05/26/19 0233)     Time spent: 16 minutes with over 50% of the time coordinating the patient's care    Jae Dire, DO [facility] Pager [phone]  Call night coverage person covering after 7pm

## 2019-05-26 NOTE — Progress Notes (Signed)
Patient ambulated in hallway independently will monitor patient. Lilyona Richner Jessup RN  

## 2019-05-26 NOTE — Progress Notes (Signed)
Nutrition Follow-up  DOCUMENTATION CODES:   Not applicable  INTERVENTION:   -Continue Ensure Enlive po BID, each supplement provides 350 kcal and 20 grams of protein -Continue MVI with minerals daily  NUTRITION DIAGNOSIS:   Increased nutrient needs related to acute illness as evidenced by estimated needs.  Ongoing  GOAL:   Patient will meet greater than or equal to 90% of their needs  Progressing   MONITOR:   I & O's, Labs, Supplement acceptance, PO intake, Weight trends  REASON FOR ASSESSMENT:   Ventilator    ASSESSMENT:   Pt with suspected IV drug abuse who while being interviewed by police developed left-sided weakness admitted with L MCA s/p IR for revascularization and L ICA stent.  3/24 - OG tube placed, intubated 3/27 - OG removed, extubated 3/29 - Regular diet ordered 3/30 - s/pTEE 4/20 - endocaditis with AV replacement, tricuspid valve repair, TEE  Reviewed I/O's: +4.3 L x 24 hours and +13 L since 05/12/19  Attempted to speak with pt via phone, however, no answer.   Pt remains with good appetite; noted meal completion 75-100%. Pt with variable acceptance of Ensure supplements, however, has consumed 3 supplements within the past 48 hours, including this morning's dose.   Due to pt's history of IV drug abuse, plan to remain hospitalized until 06/08/19 for completion of IV antibiotics.   Reviewed wt hx; wt has been stable over the past month.   Medications reviewed and include colace and lasix.   Labs reviewed.   Diet Order:   Diet Order            Diet 2 gram sodium Room service appropriate? Yes; Fluid consistency: Thin  Diet effective now              EDUCATION NEEDS:   No education needs have been identified at this time  Skin:  Skin Assessment: Skin Integrity Issues: Skin Integrity Issues:: Incisions, Other (Comment) Incisions: chest Other: MASD- intergluteal cleft  Last BM:  05/25/19  Height:   Ht Readings from Last 1 Encounters:   05/01/19 5\' 6"  (1.676 m)    Weight:   Wt Readings from Last 1 Encounters:  05/26/19 79.9 kg    Ideal Body Weight:  56.8 kg  BMI:  Body mass index is 28.42 kg/m.  Estimated Nutritional Needs:   Kcal:  1900-2100  Protein:  100-115 grams  Fluid:  > 1.9 L/day    05/28/19, RD, LDN, CDCES Registered Dietitian II Certified Diabetes Care and Education Specialist Please refer to [facility] for RD and/or RD on-call/weekend/after hours pager

## 2019-05-27 LAB — BASIC METABOLIC PANEL
Anion gap: 5 (ref 5–15)
BUN: 21 mg/dL — ABNORMAL HIGH (ref 6–20)
CO2: 25 mmol/L (ref 22–32)
Calcium: 8.7 mg/dL — ABNORMAL LOW (ref 8.9–10.3)
Chloride: 107 mmol/L (ref 98–111)
Creatinine, Ser: 0.64 mg/dL (ref 0.44–1.00)
GFR calc Af Amer: >60 mL/min (ref >60)
GFR calc non Af Amer: >60 mL/min (ref >60)
Glucose, Bld: 86 mg/dL (ref 70–99)
Potassium: 4.3 mmol/L (ref 3.5–5.1)
Sodium: 137 mmol/L (ref 135–145)

## 2019-05-27 LAB — VANCOMYCIN, TROUGH: Vancomycin Tr: 16 ug/mL (ref 15–20)

## 2019-05-27 NOTE — Progress Notes (Addendum)
      [address]       [place] [address]             [phone]      29 Days Post-Op Procedure(s) (LRB): AORTIC VALVE REPLACEMENT (AVR) using Edwards PERIMOUNT Magna Ease Pericardial Bioprosthesis - 29 MM Aortic Valve. (N/A) REPAIR OF TRICUSPID (N/A) TRANSESOPHAGEAL ECHOCARDIOGRAM (TEE) (N/A) Subjective: Feels okay this morning. Asking a lot of questions about her "series of stroke" before the surgery.   Objective: Vital signs in last 24 hours: Temp:  [97.6 F (36.4 C)-98.8 F (37.1 C)] 98 F (36.7 C) (05/19 0515) Pulse Rate:  [91-102] 91 (05/19 0021) Cardiac Rhythm: Normal sinus rhythm (05/19 0515) Resp:  [16-18] 16 (05/19 0021) BP: (93-104)/(60-73) 96/65 (05/19 0515) SpO2:  [98 %-100 %] 98 % (05/19 0515) Weight:  [80.6 kg] 80.6 kg (05/19 0500)     Intake/Output from previous day: 05/18 0701 - 05/19 0700 In: 830 [P.O.:480; IV Piggyback:350] Out: -  Intake/Output this shift: No intake/output data recorded.  General appearance: alert, cooperative and no distress Heart: regular rate and rhythm, S1, S2 normal, no murmur, click, rub or gallop Lungs: clear to auscultation bilaterally Abdomen: soft, non-tender; bowel sounds normal; no masses,  no organomegaly Extremities: extremities normal, atraumatic, no cyanosis or edema Wound: clean and dry  Lab Results: Recent Labs    05/25/19 0500  WBC 4.6  HGB 9.5*  HCT 31.3*  PLT 364   BMET:  Recent Labs    05/25/19 0500 05/27/19 0445  NA 138 137  K 4.5 4.3  CL 109 107  CO2 23 25  GLUCOSE 88 86  BUN 18 21*  CREATININE 0.64 0.64  CALCIUM 8.6* 8.7*    PT/INR: No results for input(s): LABPROT, INR in the last 72 hours. ABG    Component Value Date/Time   PHART 7.365 04/28/2019 2205   HCO3 24.5 04/28/2019 2205   TCO2 26 04/28/2019 2205   ACIDBASEDEF 1.0 04/28/2019 2205   O2SAT 97.0 04/28/2019 2205   CBG (last 3)  No results for input(s): GLUCAP in the last 72  hours.  Assessment/Plan: S/P Procedure(s) (LRB): AORTIC VALVE REPLACEMENT (AVR) using Edwards PERIMOUNT Magna Ease Pericardial Bioprosthesis - 29 MM Aortic Valve. (N/A) REPAIR OF TRICUSPID (N/A) TRANSESOPHAGEAL ECHOCARDIOGRAM (TEE) (N/A)  1. Remains stable. BP on the low end at times but patient is not symptomatic. NSR in the 80s.  2. Pulm-no acute issues 3. Creatinine 0.64, electrolytes okay.  4. H and H 9.5/31.3, expected acute blood loss anemia.  5. Endo-blood glucose well controlled. Eating well.  6. Endocarditis-continue IV antibiotics until 5/31  Plan: Medicine to review Acute left-sided MCA embolic stroke s/p IR revascularization on 3/24. The patient states no one discussed this with her or if they did she does not remember. Continue IV antibiotics. Continue several walks a day in the halls.    LOS: 56 days    [name] 05/27/2019  Medical team /IR/ neurology  to discussed concerns about stroke with her  medical service and IR to develop plan concerning caval filter Will need well defined plan of medical care after discharge - Cardiology, Neurology, ID, Medicine , IR   I have seen and examined Lauren Reyes and agree with the above assessment  and plan.  Delight Ovens MD Beeper [phone] Office [phone] 05/27/2019 9:19 AM

## 2019-05-27 NOTE — Progress Notes (Signed)
Pharmacy Antibiotic Note Lauren Reyes is a 29 y.o. female with endocarditis involving aortic, mitral, and tricuspid valves with severe aortic regurgitation. Patient's condition is further complicated by septic emboli to her brain, lungs, and spleen  Aortic valve tissue culture showing gram stain of rare gram positive cocci. She is s/p AVR/TVRepair on 4/20. Vancomycin doses are being adjusted on troughs alone due to concern of cerebritis.   Vancomycin trough is therapeutic today at 16  Plan: Continue Vancomycin 1250 mg iv Q 12 hours Continue ceftriaxone IV 2g q12 per ID Monitor renal function, clinical status End date for antibiotics: 06/08/19  Height: 5\' 6"  (167.6 cm) Weight: 80.6 kg (177 lb 11.2 oz) IBW/kg (Calculated) : 59.3  Temp (24hrs), Avg:98.2 F (36.8 C), Min:97.9 F (36.6 C), Max:98.8 F (37.1 C)  Recent Labs  Lab 05/22/19 0816 05/25/19 0500 05/27/19 0445 05/27/19 0830  WBC  --  4.6  --   --   CREATININE  --  0.64 0.64  --   VANCOTROUGH 14*  --   --  16    Estimated Creatinine Clearance: 110.1 mL/min (by C-G formula based on SCr of 0.64 mg/dL).    No Known Allergies  Antimicrobials this admission: Zosyn 3/25 >> 3/25 Cefepime 3/25 >> 3/27 Ancef 3/27 >> 3/30 Vanc 3/25 >> 3/27; restarted 3/30 >> (5/31) Unasyn 3/30>>3/31 Ceftriaxone 3/31>> (5/31) Flucon 4/25 Clotrimazole cream 4/25> 5/1   Microbiology results: 3/24 COVID/Flu >> neg 3/25 RCx >> moderate MSSA 3/25 MRSA PCR >> negative 3/25 BCx >> negative  4/16 MRSA PCR >> neg 4/20 MRSA PCR >> neg 4/20 Tricuspid valve specimen: neg 4/20 pericardial fluid: neg 4/20 aortic valve leaflets: 1 with rare gram positive cocci   Thank you for allowing pharmacy to be a part of this patient's care.  5/20, PharmD Clinical Pharmacist Please check AMION for all [facility] numbers 05/27/2019 10:24 AM

## 2019-05-27 NOTE — Progress Notes (Signed)
Walked halls independently this pm. BP pre-walk 84/62, MAP 67. Pt w/ no complaints. Will continue to monitor.

## 2019-05-27 NOTE — Progress Notes (Signed)
PROGRESS NOTE    Lauren Reyes  IWL:[id number] DOB: 1990/11/20 DOA: 04/01/2019 PCP: Patient, No Pcp Per    Brief Narrative:  29 year old female with past medical history of substance abuse with heroin, crystal meth and cocaine who presented to the ER with aphasia and right-sided weakness and had a code stroke subsequently called.  She was admitted to the neurology ICU on 04/01/2019.  She was intubated and underwent thrombectomy for left MCA M3 occlusion and recanalization.  This was unfortunately complicated by dissection and pseudoaneurysm of the left ICA, s/p stent placement.  She was extubated on 04/04/2019.  She was found to have a right common femoral vein DVT along with right groin hematoma which was treated with IVC filter placement.  Patient had a TEE which showed infective endocarditis and CT surgery was consulted.  She is status post bioprosthetic aortic valve replacement and tricuspid valve repair on 04/28/2019 and is on IV antibiotics. CT surgery on board, requested [place] takeover service on 5/6. Plan to complete 6 weeks IV antibiotics while inpatient, last dose 5/31  Assessment & Plan:   Principal Problem:   Endocarditis Active Problems:   Acute ischemic left MCA stroke (HCC)   Middle cerebral artery embolism, left   DVT (deep venous thrombosis) (HCC)   Encounter for central line placement   Endotracheal tube present   Cerebrovascular accident (CVA) due to embolism of precerebral artery (HCC)   Infective endocarditis   Normocytic anemia   Polysubstance abuse ([place])   IVDU (intravenous drug user)   Tachypnea   1. Culture negative aortic and tricuspid valve endocarditis s/p AV replacement and TV repair 05/04/2019 with Dr. Servando Snare a. Currently on IV Rocephin and vancomycin.  will need to remain hospitalized until completion of 6 weeks of therapy on 06/08/2019 due to drug use b. CT surgery on board c. continue metoprolol and Lasix d. Pt ambulating in hallway without  issues  2. Acute left-sided MCA embolic stroke s/p IR revascularization (3/24) a. Complicated by dissection and pseudoaneurysm of left ICA s/p placement of stent b. Initially on aspirin/Brilinta however Brilinta held for CT surgery c. Now on aspirin and Plavix dual antiplatelet for total 3 to 6 months d. will need follow up with Dr. Estanislado Pandy to decide on exact regimen and require outpatient neurology follow up  3. Right common femoral DVT with right groin hematoma s/p IVC filter placement 04/03/2019 a. Not a candidate for anticoagulation given septic emboli, dissection and hematoma as well as dual antiplatelets b. Revisit IVC filter removal plan with IR prior to discharge   4. Acute blood loss anemia, iron deficiency anemia, B12 deficiency and folate deficiency, stable a. Continue supplementation  5. Polysubstance abuse: THC, amphetamine and suspected IV drug use a. Currently stable without any signs of withdrawal b. Continue to encourage illicit drug abstinence at discharge  6. Anxiety, stable on BuSpar, Lexapro and Seroquel  7. Pain control a. Plan to taper oxycodone to daily as needed  8. Sinus tachycardia with soft BP, likely baseline  DVT prophylaxis: Heparin  Code Status: Full Family Communication: Pt in room, family not at bedside  Status is: Inpatient  Remains inpatient appropriate because:Unsafe d/c plan and IV treatments appropriate due to intensity of illness or inability to take PO   Dispo: The patient is from: Home              Anticipated d/c is to: Home              Anticipated d/c date  is: > 3 days, after competing IV abx on 5/31              Patient currently is not medically stable to d/c.        Consultants:  CT surgery Neurology  Infectious disease  Procedures:  IR guided left MCA thrombectomy on 3/24 AV replacement and TV repair on 4/20    Antimicrobials: Anti-infectives (From admission, onward)   Start     Dose/Rate Route  Frequency Ordered Stop   05/22/19 2100  vancomycin (VANCOREADY) IVPB 1250 mg/250 mL     1,250 mg 166.7 mL/hr over 90 Minutes Intravenous Every 12 hours 05/22/19 0937     05/19/19 2100  vancomycin (VANCOCIN) IVPB 1000 mg/200 mL premix  Status:  Discontinued     1,000 mg 200 mL/hr over 60 Minutes Intravenous Every 12 hours 05/19/19 1040 05/22/19 0937   05/03/19 1000  fluconazole (DIFLUCAN) tablet 150 mg     150 mg Oral  Once 05/03/19 0804 05/03/19 1243   04/28/19 0400  vancomycin (VANCOREADY) IVPB 1250 mg/250 mL  Status:  Discontinued     1,250 mg 166.7 mL/hr over 90 Minutes Intravenous To Surgery 04/27/19 1131 04/28/19 1451   04/28/19 0400  cefUROXime (ZINACEF) 1.5 g in sodium chloride 0.9 % 100 mL IVPB     1.5 g 200 mL/hr over 30 Minutes Intravenous To Surgery 04/27/19 1131 04/28/19 0819   04/28/19 0400  cefUROXime (ZINACEF) 750 mg in sodium chloride 0.9 % 100 mL IVPB     750 mg 200 mL/hr over 30 Minutes Intravenous To Surgery 04/27/19 1131 04/28/19 1313   04/15/19 0900  vancomycin (VANCOREADY) IVPB 750 mg/150 mL  Status:  Discontinued     750 mg 150 mL/hr over 60 Minutes Intravenous Every 8 hours 04/15/19 0821 05/19/19 1040   04/13/19 1400  vancomycin (VANCOCIN) IVPB 1000 mg/200 mL premix  Status:  Discontinued     1,000 mg 200 mL/hr over 60 Minutes Intravenous Every 8 hours 04/13/19 0839 04/15/19 0821   04/12/19 2200  vancomycin (VANCOREADY) IVPB 750 mg/150 mL  Status:  Discontinued     750 mg 150 mL/hr over 60 Minutes Intravenous Every 8 hours 04/12/19 1500 04/13/19 0839   04/10/19 1336  vancomycin (VANCOCIN) IVPB 1000 mg/200 mL premix  Status:  Discontinued     1,000 mg 200 mL/hr over 60 Minutes Intravenous Every 8 hours 04/10/19 1320 04/12/19 1500   04/08/19 1200  cefTRIAXone (ROCEPHIN) 2 g in sodium chloride 0.9 % 100 mL IVPB     2 g 200 mL/hr over 30 Minutes Intravenous Every 12 hours 04/08/19 1013 06/09/19 2359   04/08/19 0530  vancomycin (VANCOCIN) IVPB 1000 mg/200 mL premix   Status:  Discontinued     1,000 mg 200 mL/hr over 60 Minutes Intravenous Every 12 hours 04/07/19 1541 04/10/19 1320   04/07/19 1900  Ampicillin-Sulbactam (UNASYN) 3 g in sodium chloride 0.9 % 100 mL IVPB  Status:  Discontinued     3 g 200 mL/hr over 30 Minutes Intravenous Every 6 hours 04/07/19 1457 04/08/19 1013   04/07/19 1730  vancomycin (VANCOREADY) IVPB 1500 mg/300 mL     1,500 mg 150 mL/hr over 120 Minutes Intravenous  Once 04/07/19 1541 04/07/19 1937   04/04/19 1400  ceFAZolin (ANCEF) IVPB 2g/100 mL premix  Status:  Discontinued     2 g 200 mL/hr over 30 Minutes Intravenous Every 8 hours 04/04/19 1119 04/07/19 1457   04/04/19 0400  Vancomycin (VANCOCIN) 1,250 mg in sodium chloride  0.9 % 250 mL IVPB  Status:  Discontinued     1,250 mg 166.7 mL/hr over 90 Minutes Intravenous Every 12 hours 04/03/19 1635 04/04/19 1159   04/02/19 1800  ceFEPIme (MAXIPIME) 2 g in sodium chloride 0.9 % 100 mL IVPB  Status:  Discontinued     2 g 200 mL/hr over 30 Minutes Intravenous Every 8 hours 04/02/19 1218 04/04/19 1127   04/02/19 1600  vancomycin (VANCOCIN) IVPB 1000 mg/200 mL premix  Status:  Discontinued     1,000 mg 200 mL/hr over 60 Minutes Intravenous Every 12 hours 04/02/19 0100 04/03/19 1635   04/02/19 0200  piperacillin-tazobactam (ZOSYN) IVPB 3.375 g  Status:  Discontinued     3.375 g 12.5 mL/hr over 240 Minutes Intravenous Every 8 hours 04/02/19 0059 04/02/19 1219   04/02/19 0200  vancomycin (VANCOCIN) 1,500 mg in sodium chloride 0.9 % 500 mL IVPB     1,500 mg 250 mL/hr over 120 Minutes Intravenous  Once 04/02/19 0100 04/02/19 0923   04/01/19 2040  ceFAZolin (ANCEF) 2-4 GM/100ML-% IVPB    Note to Pharmacy: Channing Mutters   : cabinet override      04/01/19 2040 04/02/19 0844       Subjective: Without complaints. Eager to go home soon  Objective: Vitals:   05/27/19 0500 05/27/19 0515 05/27/19 0832 05/27/19 1142  BP:  96/65 99/67 (!) 88/58  Pulse:   90 90  Resp:   20 20  Temp:   98 F (36.7 C) 98 F (36.7 C) 98.9 F (37.2 C)  TempSrc:  Oral Oral Oral  SpO2:  98% 100% 99%  Weight: 80.6 kg     Height:        Intake/Output Summary (Last 24 hours) at 05/27/2019 1409 Last data filed at 05/27/2019 0833 Gross per 24 hour  Intake 830 ml  Output --  Net 830 ml   Filed Weights   05/25/19 0513 05/26/19 0600 05/27/19 0500  Weight: 79.2 kg 79.9 kg 80.6 kg    Examination: General exam: Appears calm and comfortable  Respiratory system: Clear to auscultation. Respiratory effort normal. Cardiovascular system: S1 & S2 heard, RRR. Systolic murmur  Data Reviewed: I have personally reviewed following labs and imaging studies  CBC: Recent Labs  Lab 05/25/19 0500  WBC 4.6  HGB 9.5*  HCT 31.3*  MCV 88.4  PLT 364   Basic Metabolic Panel: Recent Labs  Lab 05/25/19 0500 05/27/19 0445  NA 138 137  K 4.5 4.3  CL 109 107  CO2 23 25  GLUCOSE 88 86  BUN 18 21*  CREATININE 0.64 0.64  CALCIUM 8.6* 8.7*   GFR: Estimated Creatinine Clearance: 110.1 mL/min (by C-G formula based on SCr of 0.64 mg/dL). Liver Function Tests: Recent Labs  Lab 05/25/19 0500  AST 16  ALT 18  ALKPHOS 67  BILITOT 0.3  PROT 5.8*  ALBUMIN 2.7*   No results for input(s): LIPASE, AMYLASE in the last 168 hours. No results for input(s): AMMONIA in the last 168 hours. Coagulation Profile: No results for input(s): INR, PROTIME in the last 168 hours. Cardiac Enzymes: No results for input(s): CKTOTAL, CKMB, CKMBINDEX, TROPONINI in the last 168 hours. BNP (last 3 results) No results for input(s): PROBNP in the last 8760 hours. HbA1C: No results for input(s): HGBA1C in the last 72 hours. CBG: No results for input(s): GLUCAP in the last 168 hours. Lipid Profile: No results for input(s): CHOL, HDL, LDLCALC, TRIG, CHOLHDL, LDLDIRECT in the last 72 hours. Thyroid Function  Tests: No results for input(s): TSH, T4TOTAL, FREET4, T3FREE, THYROIDAB in the last 72 hours. Anemia Panel: No  results for input(s): VITAMINB12, FOLATE, FERRITIN, TIBC, IRON, RETICCTPCT in the last 72 hours. Sepsis Labs: No results for input(s): PROCALCITON, LATICACIDVEN in the last 168 hours.  No results found for this or any previous visit (from the past 240 hour(s)).   Radiology Studies: No results found.  Scheduled Meds: . aspirin EC  81 mg Oral Daily   Or  . aspirin  81 mg Per Tube Daily  . bisacodyl  10 mg Oral Daily   Or  . bisacodyl  10 mg Rectal Daily  . busPIRone  7.5 mg Oral BID  . Chlorhexidine Gluconate Cloth  6 each Topical Daily  . clopidogrel  75 mg Oral Daily  . [place] [facility] Patient [facility] Family Education   Does not apply Once  . docusate sodium  200 mg Oral Daily  . escitalopram  10 mg Oral Daily  . feeding supplement (ENSURE ENLIVE)  237 mL Oral BID BM  . ferrous fumarate-b12-vitamic C-folic acid  1 capsule Oral BID PC  . furosemide  20 mg Oral Daily  . metoprolol tartrate  6.25 mg Oral BID  . pantoprazole  40 mg Oral Daily  . pneumococcal 23 valent vaccine  0.5 mL Intramuscular Tomorrow-1000  . potassium chloride  20 mEq Oral TID  . QUEtiapine  25 mg Oral QHS  . sodium chloride flush  3 mL Intravenous Q12H   Continuous Infusions: . sodium chloride    . sodium chloride    . cefTRIAXone (ROCEPHIN)  IV 2 g (05/27/19 1056)  . lactated ringers Stopped (04/30/19 2141)  . vancomycin 1,250 mg (05/27/19 0852)     LOS: 56 days   Rickey Barbara, MD [facility] Pager On Amion  If 7PM-7AM, please contact night-coverage 05/27/2019, 2:09 PM

## 2019-05-27 NOTE — Progress Notes (Signed)
CARDIAC REHAB PHASE I   PRE:  Rate/Rhythm: 84 SR  BP:  Supine:   Sitting: 96/64  Standing:    SaO2: 99%RA  MODE:  Ambulation: 1600 ft   POST:  Rate/Rhythm: 104 ST  BP:  Supine:   Sitting: 94/56  Standing:    SaO2: 100%RA 1325-1355 Pt walked 1600 ft with steady gait and tolerated well.  Third walk today.   Luetta Nutting, RN BSN  05/27/2019 1:51 PM

## 2019-05-28 LAB — COMPREHENSIVE METABOLIC PANEL
ALT: 19 U/L (ref 0–44)
AST: 21 U/L (ref 15–41)
Albumin: 2.8 g/dL — ABNORMAL LOW (ref 3.5–5.0)
Alkaline Phosphatase: 73 U/L (ref 38–126)
Anion gap: 7 (ref 5–15)
BUN: 22 mg/dL — ABNORMAL HIGH (ref 6–20)
CO2: 23 mmol/L (ref 22–32)
Calcium: 8.5 mg/dL — ABNORMAL LOW (ref 8.9–10.3)
Chloride: 106 mmol/L (ref 98–111)
Creatinine, Ser: 0.6 mg/dL (ref 0.44–1.00)
GFR calc Af Amer: >60 mL/min (ref >60)
GFR calc non Af Amer: >60 mL/min (ref >60)
Glucose, Bld: 83 mg/dL (ref 70–99)
Potassium: 4.6 mmol/L (ref 3.5–5.1)
Sodium: 136 mmol/L (ref 135–145)
Total Bilirubin: 0.3 mg/dL (ref 0.3–1.2)
Total Protein: 6.1 g/dL — ABNORMAL LOW (ref 6.5–8.1)

## 2019-05-28 LAB — CBC
HCT: 31.6 % — ABNORMAL LOW (ref 36.0–46.0)
Hemoglobin: 9.6 g/dL — ABNORMAL LOW (ref 12.0–15.0)
MCH: 26.7 pg (ref 26.0–34.0)
MCHC: 30.4 g/dL (ref 30.0–36.0)
MCV: 88 fL (ref 80.0–100.0)
Platelets: 325 10*3/uL (ref 150–400)
RBC: 3.59 MIL/uL — ABNORMAL LOW (ref 3.87–5.11)
RDW: 16 % — ABNORMAL HIGH (ref 11.5–15.5)
WBC: 4.5 10*3/uL (ref 4.0–10.5)
nRBC: 0 % (ref 0.0–0.2)

## 2019-05-28 NOTE — Progress Notes (Signed)
      [address]       [place] [address]             [phone]      30 Days Post-Op Procedure(s) (LRB): AORTIC VALVE REPLACEMENT (AVR) using [place] PERIMOUNT Magna Ease Pericardial Bioprosthesis - 29 MM Aortic Valve. (N/A) REPAIR OF TRICUSPID (N/A) TRANSESOPHAGEAL ECHOCARDIOGRAM (TEE) (N/A) Subjective: Feels okay today. She is excited her sister was able to visit yesterday  Objective: Vital signs in last 24 hours: Temp:  [97.9 F (36.6 C)-100.7 F (38.2 C)] 100.7 F (38.2 C) (05/20 1133) Pulse Rate:  [92-104] 98 (05/20 1133) Cardiac Rhythm: Normal sinus rhythm (05/20 0939) Resp:  [17-20] 17 (05/20 1133) BP: (84-104)/(58-70) 93/58 (05/20 1133) SpO2:  [99 %-100 %] 99 % (05/20 1133) Weight:  [80.6 kg] 80.6 kg (05/20 0519)     Intake/Output from previous day: 05/19 0701 - 05/20 0700 In: 360 [P.O.:360] Out: -  Intake/Output this shift: Total I/O In: 120 [P.O.:120] Out: -   General appearance: alert, cooperative and no distress Heart: regular rate and rhythm, S1, S2 normal, no murmur, click, rub or gallop Lungs: clear to auscultation bilaterally Abdomen: soft, non-tender; bowel sounds normal; no masses,  no organomegaly Extremities: extremities normal, atraumatic, no cyanosis or edema Wound: clean and dry  Lab Results: Recent Labs    05/28/19 0500  WBC 4.5  HGB 9.6*  HCT 31.6*  PLT 325   BMET:  Recent Labs    05/27/19 0445 05/28/19 0500  NA 137 136  K 4.3 4.6  CL 107 106  CO2 25 23  GLUCOSE 86 83  BUN 21* 22*  CREATININE 0.64 0.60  CALCIUM 8.7* 8.5*    PT/INR: No results for input(s): LABPROT, INR in the last 72 hours. ABG    Component Value Date/Time   PHART 7.365 04/28/2019 2205   HCO3 24.5 04/28/2019 2205   TCO2 26 04/28/2019 2205   ACIDBASEDEF 1.0 04/28/2019 2205   O2SAT 97.0 04/28/2019 2205   CBG (last 3)  No results for input(s): GLUCAP in the last 72 hours.  Assessment/Plan: S/P Procedure(s) (LRB): AORTIC  VALVE REPLACEMENT (AVR) using Edwards PERIMOUNT Magna Ease Pericardial Bioprosthesis - 29 MM Aortic Valve. (N/A) REPAIR OF TRICUSPID (N/A) TRANSESOPHAGEAL ECHOCARDIOGRAM (TEE) (N/A)  1. Remains stable. BP on the low end at times but patient is not symptomatic. NSR in the 90s.  2. Pulm-no acute issues 3. Creatinine 0.60, electrolytes okay.  4. H and H 9.6/31.6, expected acute blood loss anemia.  5. Endo-blood glucose well controlled. Eating well.  6. Endocarditis-continue IV antibiotics until 5/31. Low-grade fever 100.7 this morning.   Plan: Neuro has not visited to discuss stoke-this will need to be addressed before the patient is discharged and well as her caval filter. Also, please arrange follow-up. Patient is ambulating in the halls independently without issue.     LOS: 57 days    Lauren Reyes 05/28/2019

## 2019-05-28 NOTE — Progress Notes (Signed)
CARDIAC REHAB PHASE I   PRE:  Rate/Rhythm: 94 SR  BP:  Supine:   Sitting: 96/70  Standing:    SaO2:   MODE:  Ambulation: 1600 ft   POST:  Rate/Rhythm: 110 ST  BP:  Supine:   Sitting: 97/46  Standing:    SaO2: 100%RA 0835-0850 Pt walked 1600 ft with steady gait. Tolerated well.  Pt in good spirits.   Luetta Nutting, RN BSN  05/28/2019 9:22 AM

## 2019-05-28 NOTE — Progress Notes (Signed)
Mobility Specialist: Progress Note     05/28/19 1416  Mobility  Activity Ambulated in hall  Level of Assistance Independent  Assistive Device None  Distance Ambulated (ft) 2000 ft  Mobility Response Tolerated well  Mobility performed by Mobility specialist  $Mobility charge 1 Mobility   Pt tolerated ambulation well. Pt was left in bed.   [facility] Rogan Ecklund Mobility Specialist

## 2019-05-28 NOTE — Progress Notes (Signed)
PROGRESS NOTE    Lauren Reyes  PZW:[id number] DOB: Sep 09, 1990 DOA: 04/01/2019 PCP: Patient, No Pcp Per    Brief Narrative:  29 year old female with past medical history of substance abuse with heroin, crystal meth and cocaine who presented to the ER with aphasia and right-sided weakness and had a code stroke subsequently called.  She was admitted to the neurology ICU on 04/01/2019.  She was intubated and underwent thrombectomy for left MCA M3 occlusion and recanalization.  This was unfortunately complicated by dissection and pseudoaneurysm of the left ICA, s/p stent placement.  She was extubated on 04/04/2019.  She was found to have a right common femoral vein DVT along with right groin hematoma which was treated with IVC filter placement.  Patient had a TEE which showed infective endocarditis and CT surgery was consulted.  She is status post bioprosthetic aortic valve replacement and tricuspid valve repair on 04/28/2019 and is on IV antibiotics. CT surgery on board, requested TRH takeover service on 5/6. Plan to complete 6 weeks IV antibiotics while inpatient, last dose 5/31  Assessment & Plan:   Principal Problem:   Endocarditis Active Problems:   Acute ischemic left MCA stroke (HCC)   Middle cerebral artery embolism, left   DVT (deep venous thrombosis) (HCC)   Encounter for central line placement   Endotracheal tube present   Cerebrovascular accident (CVA) due to embolism of precerebral artery (HCC)   Infective endocarditis   Normocytic anemia   Polysubstance abuse (HCC)   IVDU (intravenous drug user)   Tachypnea   1. Culture negative aortic and tricuspid valve endocarditis s/p AV replacement and TV repair 05/04/2019 with Dr. Tyrone Sage a. Currently on IV Rocephin and vancomycin.  will need to remain hospitalized until completion of 6 weeks of therapy on 06/08/2019 due to drug use b. CT surgery on board c. continue metoprolol and Lasix as tolerated d. Remains  stable  2. Acute left-sided MCA embolic stroke s/p IR revascularization (3/24) a. Complicated by dissection and pseudoaneurysm of left ICA s/p placement of stent b. Initially on aspirin/Brilinta however Brilinta held for CT surgery c. Now on aspirin and Plavix dual antiplatelet for total 3 to 6 months d. will need follow up with Dr. Corliss Skains to decide on exact regimen and require outpatient neurology follow up  3. Right common femoral DVT with right groin hematoma s/p IVC filter placement 04/03/2019 a. Not a candidate for anticoagulation given septic emboli, dissection and hematoma as well as dual antiplatelets b. Revisit IVC filter removal plan with IR prior to discharge   4. Acute blood loss anemia, iron deficiency anemia, B12 deficiency and folate deficiency, stable a. Continue supplementation  5. Polysubstance abuse: THC, amphetamine and suspected IV drug use a. Currently stable without any signs of withdrawal b. Continue to encourage illicit drug abstinence at discharge  6. Anxiety, stable on BuSpar, Lexapro and Seroquel  7. Pain control a. Plan to taper oxycodone to daily as needed  8. Sinus tachycardia with soft BP, likely baseline  DVT prophylaxis: Heparin  Code Status: Full Family Communication: Pt in room, family not at bedside  Status is: Inpatient  Remains inpatient appropriate because:Unsafe d/c plan and IV treatments appropriate due to intensity of illness or inability to take PO   Dispo: The patient is from: Home              Anticipated d/c is to: Home              Anticipated d/c date is: >  3 days, after competing IV abx on 5/31              Patient currently is not medically stable to d/c.   Consultants:  CT surgery Neurology  Infectious disease  Procedures:  IR guided left MCA thrombectomy on 3/24 AV replacement and TV repair on 4/20    Antimicrobials: Anti-infectives (From admission, onward)   Start     Dose/Rate Route Frequency  Ordered Stop   05/22/19 2100  vancomycin (VANCOREADY) IVPB 1250 mg/250 mL     1,250 mg 166.7 mL/hr over 90 Minutes Intravenous Every 12 hours 05/22/19 0937     05/19/19 2100  vancomycin (VANCOCIN) IVPB 1000 mg/200 mL premix  Status:  Discontinued     1,000 mg 200 mL/hr over 60 Minutes Intravenous Every 12 hours 05/19/19 1040 05/22/19 0937   05/03/19 1000  fluconazole (DIFLUCAN) tablet 150 mg     150 mg Oral  Once 05/03/19 0804 05/03/19 1243   04/28/19 0400  vancomycin (VANCOREADY) IVPB 1250 mg/250 mL  Status:  Discontinued     1,250 mg 166.7 mL/hr over 90 Minutes Intravenous To Surgery 04/27/19 1131 04/28/19 1451   04/28/19 0400  cefUROXime (ZINACEF) 1.5 g in sodium chloride 0.9 % 100 mL IVPB     1.5 g 200 mL/hr over 30 Minutes Intravenous To Surgery 04/27/19 1131 04/28/19 0819   04/28/19 0400  cefUROXime (ZINACEF) 750 mg in sodium chloride 0.9 % 100 mL IVPB     750 mg 200 mL/hr over 30 Minutes Intravenous To Surgery 04/27/19 1131 04/28/19 1313   04/15/19 0900  vancomycin (VANCOREADY) IVPB 750 mg/150 mL  Status:  Discontinued     750 mg 150 mL/hr over 60 Minutes Intravenous Every 8 hours 04/15/19 0821 05/19/19 1040   04/13/19 1400  vancomycin (VANCOCIN) IVPB 1000 mg/200 mL premix  Status:  Discontinued     1,000 mg 200 mL/hr over 60 Minutes Intravenous Every 8 hours 04/13/19 0839 04/15/19 0821   04/12/19 2200  vancomycin (VANCOREADY) IVPB 750 mg/150 mL  Status:  Discontinued     750 mg 150 mL/hr over 60 Minutes Intravenous Every 8 hours 04/12/19 1500 04/13/19 0839   04/10/19 1336  vancomycin (VANCOCIN) IVPB 1000 mg/200 mL premix  Status:  Discontinued     1,000 mg 200 mL/hr over 60 Minutes Intravenous Every 8 hours 04/10/19 1320 04/12/19 1500   04/08/19 1200  cefTRIAXone (ROCEPHIN) 2 g in sodium chloride 0.9 % 100 mL IVPB     2 g 200 mL/hr over 30 Minutes Intravenous Every 12 hours 04/08/19 1013 06/09/19 2359   04/08/19 0530  vancomycin (VANCOCIN) IVPB 1000 mg/200 mL premix  Status:   Discontinued     1,000 mg 200 mL/hr over 60 Minutes Intravenous Every 12 hours 04/07/19 1541 04/10/19 1320   04/07/19 1900  Ampicillin-Sulbactam (UNASYN) 3 g in sodium chloride 0.9 % 100 mL IVPB  Status:  Discontinued     3 g 200 mL/hr over 30 Minutes Intravenous Every 6 hours 04/07/19 1457 04/08/19 1013   04/07/19 1730  vancomycin (VANCOREADY) IVPB 1500 mg/300 mL     1,500 mg 150 mL/hr over 120 Minutes Intravenous  Once 04/07/19 1541 04/07/19 1937   04/04/19 1400  ceFAZolin (ANCEF) IVPB 2g/100 mL premix  Status:  Discontinued     2 g 200 mL/hr over 30 Minutes Intravenous Every 8 hours 04/04/19 1119 04/07/19 1457   04/04/19 0400  Vancomycin (VANCOCIN) 1,250 mg in sodium chloride 0.9 % 250 mL IVPB  Status:  Discontinued     1,250 mg 166.7 mL/hr over 90 Minutes Intravenous Every 12 hours 04/03/19 1635 04/04/19 1159   04/02/19 1800  ceFEPIme (MAXIPIME) 2 g in sodium chloride 0.9 % 100 mL IVPB  Status:  Discontinued     2 g 200 mL/hr over 30 Minutes Intravenous Every 8 hours 04/02/19 1218 04/04/19 1127   04/02/19 1600  vancomycin (VANCOCIN) IVPB 1000 mg/200 mL premix  Status:  Discontinued     1,000 mg 200 mL/hr over 60 Minutes Intravenous Every 12 hours 04/02/19 0100 04/03/19 1635   04/02/19 0200  piperacillin-tazobactam (ZOSYN) IVPB 3.375 g  Status:  Discontinued     3.375 g 12.5 mL/hr over 240 Minutes Intravenous Every 8 hours 04/02/19 0059 04/02/19 1219   04/02/19 0200  vancomycin (VANCOCIN) 1,500 mg in sodium chloride 0.9 % 500 mL IVPB     1,500 mg 250 mL/hr over 120 Minutes Intravenous  Once 04/02/19 0100 04/02/19 0923   04/01/19 2040  ceFAZolin (ANCEF) 2-4 GM/100ML-% IVPB    Note to Pharmacy: Channing Mutters   : cabinet override      04/01/19 2040 04/02/19 0844      Subjective: No complaints this AM. In good spirits  Objective: Vitals:   05/27/19 2345 05/28/19 0519 05/28/19 0741 05/28/19 1133  BP: (!) 84/62 (!) 84/63 95/70 (!) 93/58  Pulse:  94 92 98  Resp: 18 18 19 17    Temp: 97.9 F (36.6 C) 98.1 F (36.7 C) 98.3 F (36.8 C) (!) 100.7 F (38.2 C)  TempSrc: Oral Oral Oral Oral  SpO2: 100% 100% 99% 99%  Weight:  80.6 kg    Height:        Intake/Output Summary (Last 24 hours) at 05/28/2019 1440 Last data filed at 05/28/2019 1300 Gross per 24 hour  Intake 240 ml  Output --  Net 240 ml   Filed Weights   05/26/19 0600 05/27/19 0500 05/28/19 0519  Weight: 79.9 kg 80.6 kg 80.6 kg    Examination: General exam: Awake, laying in bed, in nad Respiratory system: Normal respiratory effort, no wheezing Cardiovascular system: regular rate, s1, s2  Data Reviewed: I have personally reviewed following labs and imaging studies  CBC: Recent Labs  Lab 05/25/19 0500 05/28/19 0500  WBC 4.6 4.5  HGB 9.5* 9.6*  HCT 31.3* 31.6*  MCV 88.4 88.0  PLT 364 325   Basic Metabolic Panel: Recent Labs  Lab 05/25/19 0500 05/27/19 0445 05/28/19 0500  NA 138 137 136  K 4.5 4.3 4.6  CL 109 107 106  CO2 23 25 23   GLUCOSE 88 86 83  BUN 18 21* 22*  CREATININE 0.64 0.64 0.60  CALCIUM 8.6* 8.7* 8.5*   GFR: Estimated Creatinine Clearance: 110.1 mL/min (by C-G formula based on SCr of 0.6 mg/dL). Liver Function Tests: Recent Labs  Lab 05/25/19 0500 05/28/19 0500  AST 16 21  ALT 18 19  ALKPHOS 67 73  BILITOT 0.3 0.3  PROT 5.8* 6.1*  ALBUMIN 2.7* 2.8*   No results for input(s): LIPASE, AMYLASE in the last 168 hours. No results for input(s): AMMONIA in the last 168 hours. Coagulation Profile: No results for input(s): INR, PROTIME in the last 168 hours. Cardiac Enzymes: No results for input(s): CKTOTAL, CKMB, CKMBINDEX, TROPONINI in the last 168 hours. BNP (last 3 results) No results for input(s): PROBNP in the last 8760 hours. HbA1C: No results for input(s): HGBA1C in the last 72 hours. CBG: No results for input(s): GLUCAP in the last 168 hours.  Lipid Profile: No results for input(s): CHOL, HDL, LDLCALC, TRIG, CHOLHDL, LDLDIRECT in the last 72  hours. Thyroid Function Tests: No results for input(s): TSH, T4TOTAL, FREET4, T3FREE, THYROIDAB in the last 72 hours. Anemia Panel: No results for input(s): VITAMINB12, FOLATE, FERRITIN, TIBC, IRON, RETICCTPCT in the last 72 hours. Sepsis Labs: No results for input(s): PROCALCITON, LATICACIDVEN in the last 168 hours.  No results found for this or any previous visit (from the past 240 hour(s)).   Radiology Studies: No results found.  Scheduled Meds: . aspirin EC  81 mg Oral Daily   Or  . aspirin  81 mg Per Tube Daily  . bisacodyl  10 mg Oral Daily   Or  . bisacodyl  10 mg Rectal Daily  . busPIRone  7.5 mg Oral BID  . Chlorhexidine Gluconate Cloth  6 each Topical Daily  . clopidogrel  75 mg Oral Daily  . [facility], Patient & Family Education   Does not apply Once  . docusate sodium  200 mg Oral Daily  . escitalopram  10 mg Oral Daily  . feeding supplement (ENSURE ENLIVE)  237 mL Oral BID BM  . ferrous [id number] C-folic acid  1 capsule Oral BID PC  . furosemide  20 mg Oral Daily  . metoprolol tartrate  6.25 mg Oral BID  . pantoprazole  40 mg Oral Daily  . pneumococcal 23 valent vaccine  0.5 mL Intramuscular Tomorrow-1000  . potassium chloride  20 mEq Oral TID  . QUEtiapine  25 mg Oral QHS  . sodium chloride flush  3 mL Intravenous Q12H   Continuous Infusions: . sodium chloride    . sodium chloride    . cefTRIAXone (ROCEPHIN)  IV 2 g (05/28/19 1031)  . lactated ringers Stopped (04/30/19 2141)  . vancomycin 1,250 mg (05/28/19 1154)     LOS: 57 days   Marylu Lund, MD [facility] Pager On Amion  If 7PM-7AM, please contact night-coverage 05/28/2019, 2:40 PM

## 2019-05-29 NOTE — Progress Notes (Signed)
PROGRESS NOTE    Lauren Reyes  DEY:[id number] DOB: 03/21/1990 DOA: 04/01/2019 PCP: Patient, No Pcp Per    Brief Narrative:  29 year old female with past medical history of substance abuse with heroin, crystal meth and cocaine who presented to the ER with aphasia and right-sided weakness and had a code stroke subsequently called.  She was admitted to the neurology ICU on 04/01/2019.  She was intubated and underwent thrombectomy for left MCA M3 occlusion and recanalization.  This was unfortunately complicated by dissection and pseudoaneurysm of the left ICA, s/p stent placement.  She was extubated on 04/04/2019.  She was found to have a right common femoral vein DVT along with right groin hematoma which was treated with IVC filter placement.  Patient had a TEE which showed infective endocarditis and CT surgery was consulted.  She is status post bioprosthetic aortic valve replacement and tricuspid valve repair on 04/28/2019 and is on IV antibiotics. CT surgery on board, requested TRH takeover service on 5/6. Plan to complete 6 weeks IV antibiotics while inpatient, last dose 5/31  Assessment & Plan:   Principal Problem:   Endocarditis Active Problems:   Acute ischemic left MCA stroke (HCC)   Middle cerebral artery embolism, left   DVT (deep venous thrombosis) (HCC)   Encounter for central line placement   Endotracheal tube present   Cerebrovascular accident (CVA) due to embolism of precerebral artery (HCC)   Infective endocarditis   Normocytic anemia   Polysubstance abuse (HCC)   IVDU (intravenous drug user)   Tachypnea   1. Culture negative aortic and tricuspid valve endocarditis s/p AV replacement and TV repair 05/04/2019 with Dr. Tyrone Sage a. Currently on IV Rocephin and vancomycin.  will need to remain hospitalized until completion of 6 weeks of therapy on 06/08/2019 due to drug use b. CT surgery on board c. continue metoprolol and Lasix as tolerated d. Currently  stable  2. Acute left-sided MCA embolic stroke s/p IR revascularization (3/24) a. Complicated by dissection and pseudoaneurysm of left ICA s/p placement of stent b. Initially on aspirin/Brilinta however Brilinta held for CT surgery c. Now on aspirin and Plavix dual antiplatelet for total 3 to 6 months d. will need follow up with Dr. Corliss Skains to decide on exact regimen and require outpatient neurology follow up  3. Right common femoral DVT with right groin hematoma s/p IVC filter placement 04/03/2019 a. Not a candidate for anticoagulation given septic emboli, dissection and hematoma as well as dual antiplatelets b. Revisit IVC filter removal plan with IR prior to discharge   4. Acute blood loss anemia, iron deficiency anemia, B12 deficiency and folate deficiency, stable a. Continue supplementation  5. Polysubstance abuse: THC, amphetamine and suspected IV drug use a. Currently stable without any signs of withdrawal b. Continue to encourage illicit drug abstinence at discharge  6. Anxiety, stable on BuSpar, Lexapro and Seroquel  7. Pain control a. Plan to taper oxycodone to daily as needed  8. Sinus tachycardia with soft BP, likely baseline  DVT prophylaxis: Heparin  Code Status: Full Family Communication: Pt in room, family not at bedside  Status is: Inpatient  Remains inpatient appropriate because:Unsafe d/c plan and IV treatments appropriate due to intensity of illness or inability to take PO   Dispo: The patient is from: Home              Anticipated d/c is to: Home              Anticipated d/c date is: >  3 days, after competing IV abx on 5/31              Patient currently is not medically stable to d/c.   Consultants:  CT surgery Neurology  Infectious disease  Procedures:  IR guided left MCA thrombectomy on 3/24 AV replacement and TV repair on 4/20   Antimicrobials: Anti-infectives (From admission, onward)   Start     Dose/Rate Route Frequency Ordered  Stop   05/22/19 2100  vancomycin (VANCOREADY) IVPB 1250 mg/250 mL     1,250 mg 166.7 mL/hr over 90 Minutes Intravenous Every 12 hours 05/22/19 0937     05/19/19 2100  vancomycin (VANCOCIN) IVPB 1000 mg/200 mL premix  Status:  Discontinued     1,000 mg 200 mL/hr over 60 Minutes Intravenous Every 12 hours 05/19/19 1040 05/22/19 0937   05/03/19 1000  fluconazole (DIFLUCAN) tablet 150 mg     150 mg Oral  Once 05/03/19 0804 05/03/19 1243   04/28/19 0400  vancomycin (VANCOREADY) IVPB 1250 mg/250 mL  Status:  Discontinued     1,250 mg 166.7 mL/hr over 90 Minutes Intravenous To Surgery 04/27/19 1131 04/28/19 1451   04/28/19 0400  cefUROXime (ZINACEF) 1.5 g in sodium chloride 0.9 % 100 mL IVPB     1.5 g 200 mL/hr over 30 Minutes Intravenous To Surgery 04/27/19 1131 04/28/19 0819   04/28/19 0400  cefUROXime (ZINACEF) 750 mg in sodium chloride 0.9 % 100 mL IVPB     750 mg 200 mL/hr over 30 Minutes Intravenous To Surgery 04/27/19 1131 04/28/19 1313   04/15/19 0900  vancomycin (VANCOREADY) IVPB 750 mg/150 mL  Status:  Discontinued     750 mg 150 mL/hr over 60 Minutes Intravenous Every 8 hours 04/15/19 0821 05/19/19 1040   04/13/19 1400  vancomycin (VANCOCIN) IVPB 1000 mg/200 mL premix  Status:  Discontinued     1,000 mg 200 mL/hr over 60 Minutes Intravenous Every 8 hours 04/13/19 0839 04/15/19 0821   04/12/19 2200  vancomycin (VANCOREADY) IVPB 750 mg/150 mL  Status:  Discontinued     750 mg 150 mL/hr over 60 Minutes Intravenous Every 8 hours 04/12/19 1500 04/13/19 0839   04/10/19 1336  vancomycin (VANCOCIN) IVPB 1000 mg/200 mL premix  Status:  Discontinued     1,000 mg 200 mL/hr over 60 Minutes Intravenous Every 8 hours 04/10/19 1320 04/12/19 1500   04/08/19 1200  cefTRIAXone (ROCEPHIN) 2 g in sodium chloride 0.9 % 100 mL IVPB     2 g 200 mL/hr over 30 Minutes Intravenous Every 12 hours 04/08/19 1013 06/09/19 2359   04/08/19 0530  vancomycin (VANCOCIN) IVPB 1000 mg/200 mL premix  Status:   Discontinued     1,000 mg 200 mL/hr over 60 Minutes Intravenous Every 12 hours 04/07/19 1541 04/10/19 1320   04/07/19 1900  Ampicillin-Sulbactam (UNASYN) 3 g in sodium chloride 0.9 % 100 mL IVPB  Status:  Discontinued     3 g 200 mL/hr over 30 Minutes Intravenous Every 6 hours 04/07/19 1457 04/08/19 1013   04/07/19 1730  vancomycin (VANCOREADY) IVPB 1500 mg/300 mL     1,500 mg 150 mL/hr over 120 Minutes Intravenous  Once 04/07/19 1541 04/07/19 1937   04/04/19 1400  ceFAZolin (ANCEF) IVPB 2g/100 mL premix  Status:  Discontinued     2 g 200 mL/hr over 30 Minutes Intravenous Every 8 hours 04/04/19 1119 04/07/19 1457   04/04/19 0400  Vancomycin (VANCOCIN) 1,250 mg in sodium chloride 0.9 % 250 mL IVPB  Status:  Discontinued     1,250 mg 166.7 mL/hr over 90 Minutes Intravenous Every 12 hours 04/03/19 1635 04/04/19 1159   04/02/19 1800  ceFEPIme (MAXIPIME) 2 g in sodium chloride 0.9 % 100 mL IVPB  Status:  Discontinued     2 g 200 mL/hr over 30 Minutes Intravenous Every 8 hours 04/02/19 1218 04/04/19 1127   04/02/19 1600  vancomycin (VANCOCIN) IVPB 1000 mg/200 mL premix  Status:  Discontinued     1,000 mg 200 mL/hr over 60 Minutes Intravenous Every 12 hours 04/02/19 0100 04/03/19 1635   04/02/19 0200  piperacillin-tazobactam (ZOSYN) IVPB 3.375 g  Status:  Discontinued     3.375 g 12.5 mL/hr over 240 Minutes Intravenous Every 8 hours 04/02/19 0059 04/02/19 1219   04/02/19 0200  vancomycin (VANCOCIN) 1,500 mg in sodium chloride 0.9 % 500 mL IVPB     1,500 mg 250 mL/hr over 120 Minutes Intravenous  Once 04/02/19 0100 04/02/19 0923   04/01/19 2040  ceFAZolin (ANCEF) 2-4 GM/100ML-% IVPB    Note to Pharmacy: [name]   : cabinet override      04/01/19 2040 04/02/19 0844      Subjective: Without complaints  Objective: Vitals:   05/29/19 0500 05/29/19 0603 05/29/19 0744 05/29/19 1148  BP:  (!) 92/53 94/66 92/63   Pulse:  99 81 92  Resp:  13 16 14   Temp:  98.4 F (36.9 C) 98.4 F (36.9  C) 98.4 F (36.9 C)  TempSrc:  Oral Oral Oral  SpO2:  100%    Weight: 80.8 kg     Height:        Intake/Output Summary (Last 24 hours) at 05/29/2019 1638 Last data filed at 05/29/2019 1149 Gross per 24 hour  Intake 480 ml  Output --  Net 480 ml   Filed Weights   05/27/19 0500 05/28/19 0519 05/29/19 0500  Weight: 80.6 kg 80.6 kg 80.8 kg    Examination: General exam: Conversant, in no acute distress Respiratory system: normal chest rise, clear, no audible wheezing  Data Reviewed: I have personally reviewed following labs and imaging studies  CBC: Recent Labs  Lab 05/25/19 0500 05/28/19 0500  WBC 4.6 4.5  HGB 9.5* 9.6*  HCT 31.3* 31.6*  MCV 88.4 88.0  PLT 364 378   Basic Metabolic Panel: Recent Labs  Lab 05/25/19 0500 05/27/19 0445 05/28/19 0500  NA 138 137 136  K 4.5 4.3 4.6  CL 109 107 106  CO2 23 25 23   GLUCOSE 88 86 83  BUN 18 21* 22*  CREATININE 0.64 0.64 0.60  CALCIUM 8.6* 8.7* 8.5*   GFR: Estimated Creatinine Clearance: 110.2 mL/min (by C-G formula based on SCr of 0.6 mg/dL). Liver Function Tests: Recent Labs  Lab 05/25/19 0500 05/28/19 0500  AST 16 21  ALT 18 19  ALKPHOS 67 73  BILITOT 0.3 0.3  PROT 5.8* 6.1*  ALBUMIN 2.7* 2.8*   No results for input(s): LIPASE, AMYLASE in the last 168 hours. No results for input(s): AMMONIA in the last 168 hours. Coagulation Profile: No results for input(s): INR, PROTIME in the last 168 hours. Cardiac Enzymes: No results for input(s): CKTOTAL, CKMB, CKMBINDEX, TROPONINI in the last 168 hours. BNP (last 3 results) No results for input(s): PROBNP in the last 8760 hours. HbA1C: No results for input(s): HGBA1C in the last 72 hours. CBG: No results for input(s): GLUCAP in the last 168 hours. Lipid Profile: No results for input(s): CHOL, HDL, LDLCALC, TRIG, CHOLHDL, LDLDIRECT in the last 72  hours. Thyroid Function Tests: No results for input(s): TSH, T4TOTAL, FREET4, T3FREE, THYROIDAB in the last 72  hours. Anemia Panel: No results for input(s): VITAMINB12, FOLATE, FERRITIN, TIBC, IRON, RETICCTPCT in the last 72 hours. Sepsis Labs: No results for input(s): PROCALCITON, LATICACIDVEN in the last 168 hours.  No results found for this or any previous visit (from the past 240 hour(s)).   Radiology Studies: No results found.  Scheduled Meds: . aspirin EC  81 mg Oral Daily   Or  . aspirin  81 mg Per Tube Daily  . bisacodyl  10 mg Oral Daily   Or  . bisacodyl  10 mg Rectal Daily  . busPIRone  7.5 mg Oral BID  . Chlorhexidine Gluconate Cloth  6 each Topical Daily  . clopidogrel  75 mg Oral Daily  . [facility], Patient & Family Education   Does not apply Once  . docusate sodium  200 mg Oral Daily  . escitalopram  10 mg Oral Daily  . feeding supplement (ENSURE ENLIVE)  237 mL Oral BID BM  . ferrous fumarate-b12-vitamic C-folic acid  1 capsule Oral BID PC  . furosemide  20 mg Oral Daily  . metoprolol tartrate  6.25 mg Oral BID  . pantoprazole  40 mg Oral Daily  . pneumococcal 23 valent vaccine  0.5 mL Intramuscular Tomorrow-1000  . potassium chloride  20 mEq Oral TID  . QUEtiapine  25 mg Oral QHS  . sodium chloride flush  3 mL Intravenous Q12H   Continuous Infusions: . sodium chloride    . sodium chloride    . cefTRIAXone (ROCEPHIN)  IV 2 g (05/29/19 1305)  . lactated ringers Stopped (04/30/19 2141)  . vancomycin 1,250 mg (05/29/19 1104)     LOS: 58 days   Rickey Barbara, MD [facility] Pager On Amion  If 7PM-7AM, please contact night-coverage 05/29/2019, 4:38 PM

## 2019-05-29 NOTE — Progress Notes (Signed)
1455 Pt sleeping now. Checked at 1325 and pt sleeping then. Pt likes to sleep when receiving antibiotic. Can walk independently. Will continue to follow. Luetta Nutting RN BSN 05/29/2019 2:56 PM

## 2019-05-29 NOTE — Progress Notes (Addendum)
      [address]       [place] [address]             [phone]      31 Days Post-Op Procedure(s) (LRB): AORTIC VALVE REPLACEMENT (AVR) using Edwards PERIMOUNT Magna Ease Pericardial Bioprosthesis - 29 MM Aortic Valve. (N/A) REPAIR OF TRICUSPID (N/A) TRANSESOPHAGEAL ECHOCARDIOGRAM (TEE) (N/A) Subjective: Feels okay this morning. No complaints.   Objective: Vital signs in last 24 hours: Temp:  [98 F (36.7 C)-100.7 F (38.2 C)] 98.4 F (36.9 C) (05/21 0744) Pulse Rate:  [81-99] 81 (05/21 0744) Cardiac Rhythm: Normal sinus rhythm (05/21 0726) Resp:  [13-17] 16 (05/21 0744) BP: (92-99)/(51-66) 94/66 (05/21 0744) SpO2:  [99 %-100 %] 100 % (05/21 0603) Weight:  [80.8 kg] 80.8 kg (05/21 0500)     Intake/Output from previous day: 05/20 0701 - 05/21 0700 In: 480 [P.O.:480] Out: -  Intake/Output this shift: No intake/output data recorded.  General appearance: alert, cooperative and no distress Heart: regular rate and rhythm, S1, S2 normal, no murmur, click, rub or gallop Lungs: clear to auscultation bilaterally Abdomen: soft, non-tender; bowel sounds normal; no masses,  no organomegaly Extremities: extremities normal, atraumatic, no cyanosis or edema Wound: clean and dry  Lab Results: Recent Labs    05/28/19 0500  WBC 4.5  HGB 9.6*  HCT 31.6*  PLT 325   BMET:  Recent Labs    05/27/19 0445 05/28/19 0500  NA 137 136  K 4.3 4.6  CL 107 106  CO2 25 23  GLUCOSE 86 83  BUN 21* 22*  CREATININE 0.64 0.60  CALCIUM 8.7* 8.5*    PT/INR: No results for input(s): LABPROT, INR in the last 72 hours. ABG    Component Value Date/Time   PHART 7.365 04/28/2019 2205   HCO3 24.5 04/28/2019 2205   TCO2 26 04/28/2019 2205   ACIDBASEDEF 1.0 04/28/2019 2205   O2SAT 97.0 04/28/2019 2205   CBG (last 3)  No results for input(s): GLUCAP in the last 72 hours.  Assessment/Plan: S/P Procedure(s) (LRB): AORTIC VALVE REPLACEMENT (AVR) using Edwards  PERIMOUNT Magna Ease Pericardial Bioprosthesis - 29 MM Aortic Valve. (N/A) REPAIR OF TRICUSPID (N/A) TRANSESOPHAGEAL ECHOCARDIOGRAM (TEE) (N/A)  1. Remains stable. BP on the low end at times but patient is not symptomatic. NSR in the 80s.  2. Pulm-no acute issues 3. Creatinine 0.60, electrolytes okay.  4. H and H 9.6/31.6, expected acute blood loss anemia.  5. Endo-blood glucose well controlled. Eating well.  6. Endocarditis-continue IV antibiotics until 5/31  Plan: Continue antibiotics until 5/31. Continue ambulation in the halls.    LOS: 58 days    [name] 05/29/2019  I have seen and examined Lauren Reyes and agree with the above assessment  and plan.  Delight Ovens MD [facility] [phone] Office [phone] 05/29/2019 8:46 AM

## 2019-05-29 NOTE — Progress Notes (Signed)
Mobility Specialist - Progress Note   05/29/19 1500  Mobility  Activity Refused mobility  Mobility performed by Mobility specialist  $Mobility charge 1 Mobility    Lauren Reyes [facility] Mobility Specialist

## 2019-05-30 NOTE — Progress Notes (Signed)
PROGRESS NOTE    Lauren Reyes  WUJ:[id number] DOB: 12-Nov-1990 DOA: 04/01/2019 PCP: Patient, No Pcp Per    Brief Narrative:  29 year old female with past medical history of substance abuse with heroin, crystal meth and cocaine who presented to the ER with aphasia and right-sided weakness and had a code stroke subsequently called.  She was admitted to the neurology ICU on 04/01/2019.  She was intubated and underwent thrombectomy for left MCA M3 occlusion and recanalization.  This was unfortunately complicated by dissection and pseudoaneurysm of the left ICA, s/p stent placement.  She was extubated on 04/04/2019.  She was found to have a right common femoral vein DVT along with right groin hematoma which was treated with IVC filter placement.  Patient had a TEE which showed infective endocarditis and CT surgery was consulted.  She is status post bioprosthetic aortic valve replacement and tricuspid valve repair on 04/28/2019 and is on IV antibiotics. CT surgery on board, requested Goff takeover service on 5/6. Plan to complete 6 weeks IV antibiotics while inpatient, last dose 5/31  Assessment & Plan:   Principal Problem:   Endocarditis Active Problems:   Acute ischemic left MCA stroke (HCC)   Middle cerebral artery embolism, left   DVT (deep venous thrombosis) (HCC)   Encounter for central line placement   Endotracheal tube present   Cerebrovascular accident (CVA) due to embolism of precerebral artery (HCC)   Infective endocarditis   Normocytic anemia   Polysubstance abuse ([place])   IVDU (intravenous drug user)   Tachypnea   1. Culture negative aortic and tricuspid valve endocarditis s/p AV replacement and TV repair 05/04/2019 with Dr. Servando Snare a. Currently on IV Rocephin and vancomycin.  will need to remain hospitalized until completion of 6 weeks of therapy on 06/08/2019 due to drug use b. CT surgery on board c. continue metoprolol and Lasix as tolerated d. Presently  stable  2. Acute left-sided MCA embolic stroke s/p IR revascularization (3/24) a. Complicated by dissection and pseudoaneurysm of left ICA s/p placement of stent b. Initially on aspirin/Brilinta however Brilinta held for CT surgery c. Now on aspirin and Plavix dual antiplatelet for total 3 to 6 months d. will need follow up with Dr. Estanislado Pandy to decide on exact regimen and require outpatient neurology follow up  3. Right common femoral DVT with right groin hematoma s/p IVC filter placement 04/03/2019 a. Not a candidate for anticoagulation given septic emboli, dissection and hematoma as well as dual antiplatelets b. Revisit IVC filter removal plan with IR prior to discharge   4. Acute blood loss anemia, iron deficiency anemia, B12 deficiency and folate deficiency, stable a. Continue supplementation  5. Polysubstance abuse: THC, amphetamine and suspected IV drug use a. Currently stable without any signs of withdrawal b. Continue to encourage illicit drug abstinence at discharge  6. Anxiety, stable on BuSpar, Lexapro and Seroquel  7. Pain control a. Plan to taper oxycodone to daily as needed  8. Sinus tachycardia with soft BP, likely baseline  DVT prophylaxis: Heparin  Code Status: Full Family Communication: Pt in room, family not at bedside  Status is: Inpatient  Remains inpatient appropriate because:Unsafe d/c plan and IV treatments appropriate due to intensity of illness or inability to take PO   Dispo: The patient is from: Home              Anticipated d/c is to: Home              Anticipated d/c date is: >  3 days, after competing IV abx on 5/31              Patient currently is not medically stable to d/c.   Consultants:  CT surgery Neurology  Infectious disease  Procedures:  IR guided left MCA thrombectomy on 3/24 AV replacement and TV repair on 4/20   Antimicrobials: Anti-infectives (From admission, onward)   Start     Dose/Rate Route Frequency Ordered  Stop   05/22/19 2100  vancomycin (VANCOREADY) IVPB 1250 mg/250 mL     1,250 mg 166.7 mL/hr over 90 Minutes Intravenous Every 12 hours 05/22/19 0937     05/19/19 2100  vancomycin (VANCOCIN) IVPB 1000 mg/200 mL premix  Status:  Discontinued     1,000 mg 200 mL/hr over 60 Minutes Intravenous Every 12 hours 05/19/19 1040 05/22/19 0937   05/03/19 1000  fluconazole (DIFLUCAN) tablet 150 mg     150 mg Oral  Once 05/03/19 0804 05/03/19 1243   04/28/19 0400  vancomycin (VANCOREADY) IVPB 1250 mg/250 mL  Status:  Discontinued     1,250 mg 166.7 mL/hr over 90 Minutes Intravenous To Surgery 04/27/19 1131 04/28/19 1451   04/28/19 0400  cefUROXime (ZINACEF) 1.5 g in sodium chloride 0.9 % 100 mL IVPB     1.5 g 200 mL/hr over 30 Minutes Intravenous To Surgery 04/27/19 1131 04/28/19 0819   04/28/19 0400  cefUROXime (ZINACEF) 750 mg in sodium chloride 0.9 % 100 mL IVPB     750 mg 200 mL/hr over 30 Minutes Intravenous To Surgery 04/27/19 1131 04/28/19 1313   04/15/19 0900  vancomycin (VANCOREADY) IVPB 750 mg/150 mL  Status:  Discontinued     750 mg 150 mL/hr over 60 Minutes Intravenous Every 8 hours 04/15/19 0821 05/19/19 1040   04/13/19 1400  vancomycin (VANCOCIN) IVPB 1000 mg/200 mL premix  Status:  Discontinued     1,000 mg 200 mL/hr over 60 Minutes Intravenous Every 8 hours 04/13/19 0839 04/15/19 0821   04/12/19 2200  vancomycin (VANCOREADY) IVPB 750 mg/150 mL  Status:  Discontinued     750 mg 150 mL/hr over 60 Minutes Intravenous Every 8 hours 04/12/19 1500 04/13/19 0839   04/10/19 1336  vancomycin (VANCOCIN) IVPB 1000 mg/200 mL premix  Status:  Discontinued     1,000 mg 200 mL/hr over 60 Minutes Intravenous Every 8 hours 04/10/19 1320 04/12/19 1500   04/08/19 1200  cefTRIAXone (ROCEPHIN) 2 g in sodium chloride 0.9 % 100 mL IVPB     2 g 200 mL/hr over 30 Minutes Intravenous Every 12 hours 04/08/19 1013 06/09/19 2359   04/08/19 0530  vancomycin (VANCOCIN) IVPB 1000 mg/200 mL premix  Status:   Discontinued     1,000 mg 200 mL/hr over 60 Minutes Intravenous Every 12 hours 04/07/19 1541 04/10/19 1320   04/07/19 1900  Ampicillin-Sulbactam (UNASYN) 3 g in sodium chloride 0.9 % 100 mL IVPB  Status:  Discontinued     3 g 200 mL/hr over 30 Minutes Intravenous Every 6 hours 04/07/19 1457 04/08/19 1013   04/07/19 1730  vancomycin (VANCOREADY) IVPB 1500 mg/300 mL     1,500 mg 150 mL/hr over 120 Minutes Intravenous  Once 04/07/19 1541 04/07/19 1937   04/04/19 1400  ceFAZolin (ANCEF) IVPB 2g/100 mL premix  Status:  Discontinued     2 g 200 mL/hr over 30 Minutes Intravenous Every 8 hours 04/04/19 1119 04/07/19 1457   04/04/19 0400  Vancomycin (VANCOCIN) 1,250 mg in sodium chloride 0.9 % 250 mL IVPB  Status:  Discontinued     1,250 mg 166.7 mL/hr over 90 Minutes Intravenous Every 12 hours 04/03/19 1635 04/04/19 1159   04/02/19 1800  ceFEPIme (MAXIPIME) 2 g in sodium chloride 0.9 % 100 mL IVPB  Status:  Discontinued     2 g 200 mL/hr over 30 Minutes Intravenous Every 8 hours 04/02/19 1218 04/04/19 1127   04/02/19 1600  vancomycin (VANCOCIN) IVPB 1000 mg/200 mL premix  Status:  Discontinued     1,000 mg 200 mL/hr over 60 Minutes Intravenous Every 12 hours 04/02/19 0100 04/03/19 1635   04/02/19 0200  piperacillin-tazobactam (ZOSYN) IVPB 3.375 g  Status:  Discontinued     3.375 g 12.5 mL/hr over 240 Minutes Intravenous Every 8 hours 04/02/19 0059 04/02/19 1219   04/02/19 0200  vancomycin (VANCOCIN) 1,500 mg in sodium chloride 0.9 % 500 mL IVPB     1,500 mg 250 mL/hr over 120 Minutes Intravenous  Once 04/02/19 0100 04/02/19 0923   04/01/19 2040  ceFAZolin (ANCEF) 2-4 GM/100ML-% IVPB    Note to Pharmacy: Channing Mutters   : cabinet override      04/01/19 2040 04/02/19 0844      Subjective: No complaints this AM  Objective: Vitals:   05/29/19 2305 05/30/19 0455 05/30/19 0500 05/30/19 0840  BP: 99/68 90/67    Pulse: 99 84    Resp:  16  18  Temp: 98.5 F (36.9 C) 99.1 F (37.3 C)  97.9  F (36.6 C)  TempSrc: Oral Oral  Oral  SpO2: 98%     Weight:   80.4 kg   Height:        Intake/Output Summary (Last 24 hours) at 05/30/2019 1355 Last data filed at 05/29/2019 2000 Gross per 24 hour  Intake 120 ml  Output --  Net 120 ml   Filed Weights   05/28/19 0519 05/29/19 0500 05/30/19 0500  Weight: 80.6 kg 80.8 kg 80.4 kg    Examination: General exam: Awake, laying in bed, in nad Respiratory system: Normal respiratory effort, no wheezing  Data Reviewed: I have personally reviewed following labs and imaging studies  CBC: Recent Labs  Lab 05/25/19 0500 05/28/19 0500  WBC 4.6 4.5  HGB 9.5* 9.6*  HCT 31.3* 31.6*  MCV 88.4 88.0  PLT 364 325   Basic Metabolic Panel: Recent Labs  Lab 05/25/19 0500 05/27/19 0445 05/28/19 0500  NA 138 137 136  K 4.5 4.3 4.6  CL 109 107 106  CO2 23 25 23   GLUCOSE 88 86 83  BUN 18 21* 22*  CREATININE 0.64 0.64 0.60  CALCIUM 8.6* 8.7* 8.5*   GFR: Estimated Creatinine Clearance: 109.9 mL/min (by C-G formula based on SCr of 0.6 mg/dL). Liver Function Tests: Recent Labs  Lab 05/25/19 0500 05/28/19 0500  AST 16 21  ALT 18 19  ALKPHOS 67 73  BILITOT 0.3 0.3  PROT 5.8* 6.1*  ALBUMIN 2.7* 2.8*   No results for input(s): LIPASE, AMYLASE in the last 168 hours. No results for input(s): AMMONIA in the last 168 hours. Coagulation Profile: No results for input(s): INR, PROTIME in the last 168 hours. Cardiac Enzymes: No results for input(s): CKTOTAL, CKMB, CKMBINDEX, TROPONINI in the last 168 hours. BNP (last 3 results) No results for input(s): PROBNP in the last 8760 hours. HbA1C: No results for input(s): HGBA1C in the last 72 hours. CBG: No results for input(s): GLUCAP in the last 168 hours. Lipid Profile: No results for input(s): CHOL, HDL, LDLCALC, TRIG, CHOLHDL, LDLDIRECT in the last 72  hours. Thyroid Function Tests: No results for input(s): TSH, T4TOTAL, FREET4, T3FREE, THYROIDAB in the last 72 hours. Anemia Panel: No  results for input(s): VITAMINB12, FOLATE, FERRITIN, TIBC, IRON, RETICCTPCT in the last 72 hours. Sepsis Labs: No results for input(s): PROCALCITON, LATICACIDVEN in the last 168 hours.  No results found for this or any previous visit (from the past 240 hour(s)).   Radiology Studies: No results found.  Scheduled Meds: . aspirin EC  81 mg Oral Daily   Or  . aspirin  81 mg Per Tube Daily  . bisacodyl  10 mg Oral Daily   Or  . bisacodyl  10 mg Rectal Daily  . busPIRone  7.5 mg Oral BID  . Chlorhexidine Gluconate Cloth  6 each Topical Daily  . clopidogrel  75 mg Oral Daily  . [place] [facility] Patient [facility] Family Education   Does not apply Once  . docusate sodium  200 mg Oral Daily  . escitalopram  10 mg Oral Daily  . feeding supplement (ENSURE ENLIVE)  237 mL Oral BID BM  . ferrous fumarate-b12-vitamic C-folic acid  1 capsule Oral BID PC  . furosemide  20 mg Oral Daily  . metoprolol tartrate  6.25 mg Oral BID  . pantoprazole  40 mg Oral Daily  . pneumococcal 23 valent vaccine  0.5 mL Intramuscular Tomorrow-1000  . potassium chloride  20 mEq Oral TID  . QUEtiapine  25 mg Oral QHS  . sodium chloride flush  3 mL Intravenous Q12H   Continuous Infusions: . sodium chloride    . sodium chloride    . cefTRIAXone (ROCEPHIN)  IV 2 g (05/30/19 0950)  . lactated ringers Stopped (04/30/19 2141)  . vancomycin 1,250 mg (05/30/19 1056)     LOS: 59 days   Rickey Barbara, MD [facility] Pager On Amion  If 7PM-7AM, please contact night-coverage 05/30/2019, 1:55 PM

## 2019-05-30 NOTE — Progress Notes (Signed)
32 Days Post-Op Procedure(s) (LRB): AORTIC VALVE REPLACEMENT (AVR) using Edwards PERIMOUNT Magna Ease Pericardial Bioprosthesis - 29 MM Aortic Valve. (N/A) REPAIR OF TRICUSPID (N/A) TRANSESOPHAGEAL ECHOCARDIOGRAM (TEE) (N/A) Subjective: Resting in bed, says she feels well and has no new concerns.   Objective: Vital signs in last 24 hours: Temp:  [97.9 F (36.6 C)-99.1 F (37.3 C)] 97.9 F (36.6 C) (05/22 0840) Pulse Rate:  [84-99] 84 (05/22 0455) Cardiac Rhythm: Normal sinus rhythm (05/22 0830) Resp:  [14-18] 18 (05/22 0840) BP: (90-113)/(63-79) 90/67 (05/22 0455) SpO2:  [98 %-100 %] 98 % (05/21 2305) Weight:  [80.4 kg] 80.4 kg (05/22 0500)  Hemodynamic parameters for last 24 hours:    Intake/Output from previous day: 05/21 0701 - 05/22 0700 In: 360 [P.O.:360] Out: -  Intake/Output this shift: No intake/output data recorded.  General appearance: alert, cooperative and no distress Neurologic: intact Heart: regular rate and rhythm  Lab Results: Recent Labs    05/28/19 0500  WBC 4.5  HGB 9.6*  HCT 31.6*  PLT 325   BMET:  Recent Labs    05/28/19 0500  NA 136  K 4.6  CL 106  CO2 23  GLUCOSE 83  BUN 22*  CREATININE 0.60  CALCIUM 8.5*    PT/INR: No results for input(s): LABPROT, INR in the last 72 hours. ABG    Component Value Date/Time   PHART 7.365 04/28/2019 2205   HCO3 24.5 04/28/2019 2205   TCO2 26 04/28/2019 2205   ACIDBASEDEF 1.0 04/28/2019 2205   O2SAT 97.0 04/28/2019 2205   CBG (last 3)  No results for input(s): GLUCAP in the last 72 hours.  Assessment/Plan: S/P Procedure(s) (LRB): AORTIC VALVE REPLACEMENT (AVR) using Edwards PERIMOUNT Magna Ease Pericardial Bioprosthesis - 29 MM Aortic Valve. (N/A) REPAIR OF TRICUSPID (N/A) TRANSESOPHAGEAL ECHOCARDIOGRAM (TEE) (N/A)  -POD32 AV replacement (tissue valve) and TV repair for endocarditis.  IV ABX planned through 5/31. No change in mgt from CTS standpoint.    LOS: 59 days    Leary Roca, [place] [phone] 05/30/2019

## 2019-05-31 NOTE — Progress Notes (Signed)
PROGRESS NOTE    Lauren Reyes  PZW:[id number] DOB: Sep 09, 1990 DOA: 04/01/2019 PCP: Patient, No Pcp Per    Brief Narrative:  29 year old female with past medical history of substance abuse with heroin, crystal meth and cocaine who presented to the ER with aphasia and right-sided weakness and had a code stroke subsequently called.  She was admitted to the neurology ICU on 04/01/2019.  She was intubated and underwent thrombectomy for left MCA M3 occlusion and recanalization.  This was unfortunately complicated by dissection and pseudoaneurysm of the left ICA, s/p stent placement.  She was extubated on 04/04/2019.  She was found to have a right common femoral vein DVT along with right groin hematoma which was treated with IVC filter placement.  Patient had a TEE which showed infective endocarditis and CT surgery was consulted.  She is status post bioprosthetic aortic valve replacement and tricuspid valve repair on 04/28/2019 and is on IV antibiotics. CT surgery on board, requested TRH takeover service on 5/6. Plan to complete 6 weeks IV antibiotics while inpatient, last dose 5/31  Assessment & Plan:   Principal Problem:   Endocarditis Active Problems:   Acute ischemic left MCA stroke (HCC)   Middle cerebral artery embolism, left   DVT (deep venous thrombosis) (HCC)   Encounter for central line placement   Endotracheal tube present   Cerebrovascular accident (CVA) due to embolism of precerebral artery (HCC)   Infective endocarditis   Normocytic anemia   Polysubstance abuse (HCC)   IVDU (intravenous drug user)   Tachypnea   1. Culture negative aortic and tricuspid valve endocarditis s/p AV replacement and TV repair 05/04/2019 with Dr. Tyrone Sage a. Currently on IV Rocephin and vancomycin.  will need to remain hospitalized until completion of 6 weeks of therapy on 06/08/2019 due to drug use b. CT surgery on board c. continue metoprolol and Lasix as tolerated d. Remains  stable  2. Acute left-sided MCA embolic stroke s/p IR revascularization (3/24) a. Complicated by dissection and pseudoaneurysm of left ICA s/p placement of stent b. Initially on aspirin/Brilinta however Brilinta held for CT surgery c. Now on aspirin and Plavix dual antiplatelet for total 3 to 6 months d. will need follow up with Dr. Corliss Skains to decide on exact regimen and require outpatient neurology follow up  3. Right common femoral DVT with right groin hematoma s/p IVC filter placement 04/03/2019 a. Not a candidate for anticoagulation given septic emboli, dissection and hematoma as well as dual antiplatelets b. Revisit IVC filter removal plan with IR prior to discharge   4. Acute blood loss anemia, iron deficiency anemia, B12 deficiency and folate deficiency, stable a. Continue supplementation  5. Polysubstance abuse: THC, amphetamine and suspected IV drug use a. Currently stable without any signs of withdrawal b. Continue to encourage illicit drug abstinence at discharge  6. Anxiety, stable on BuSpar, Lexapro and Seroquel  7. Pain control a. Plan to taper oxycodone to daily as needed  8. Sinus tachycardia with soft BP, likely baseline  DVT prophylaxis: Heparin  Code Status: Full Family Communication: Pt in room, family not at bedside  Status is: Inpatient  Remains inpatient appropriate because:Unsafe d/c plan and IV treatments appropriate due to intensity of illness or inability to take PO   Dispo: The patient is from: Home              Anticipated d/c is to: Home              Anticipated d/c date is: >  3 days, after competing IV abx on 5/31              Patient currently is not medically stable to d/c.   Consultants:  CT surgery Neurology  Infectious disease  Procedures:  IR guided left MCA thrombectomy on 3/24 AV replacement and TV repair on 4/20   Antimicrobials: Anti-infectives (From admission, onward)   Start     Dose/Rate Route Frequency Ordered  Stop   05/22/19 2100  vancomycin (VANCOREADY) IVPB 1250 mg/250 mL     1,250 mg 166.7 mL/hr over 90 Minutes Intravenous Every 12 hours 05/22/19 0937     05/19/19 2100  vancomycin (VANCOCIN) IVPB 1000 mg/200 mL premix  Status:  Discontinued     1,000 mg 200 mL/hr over 60 Minutes Intravenous Every 12 hours 05/19/19 1040 05/22/19 0937   05/03/19 1000  fluconazole (DIFLUCAN) tablet 150 mg     150 mg Oral  Once 05/03/19 0804 05/03/19 1243   04/28/19 0400  vancomycin (VANCOREADY) IVPB 1250 mg/250 mL  Status:  Discontinued     1,250 mg 166.7 mL/hr over 90 Minutes Intravenous To Surgery 04/27/19 1131 04/28/19 1451   04/28/19 0400  cefUROXime (ZINACEF) 1.5 g in sodium chloride 0.9 % 100 mL IVPB     1.5 g 200 mL/hr over 30 Minutes Intravenous To Surgery 04/27/19 1131 04/28/19 0819   04/28/19 0400  cefUROXime (ZINACEF) 750 mg in sodium chloride 0.9 % 100 mL IVPB     750 mg 200 mL/hr over 30 Minutes Intravenous To Surgery 04/27/19 1131 04/28/19 1313   04/15/19 0900  vancomycin (VANCOREADY) IVPB 750 mg/150 mL  Status:  Discontinued     750 mg 150 mL/hr over 60 Minutes Intravenous Every 8 hours 04/15/19 0821 05/19/19 1040   04/13/19 1400  vancomycin (VANCOCIN) IVPB 1000 mg/200 mL premix  Status:  Discontinued     1,000 mg 200 mL/hr over 60 Minutes Intravenous Every 8 hours 04/13/19 0839 04/15/19 0821   04/12/19 2200  vancomycin (VANCOREADY) IVPB 750 mg/150 mL  Status:  Discontinued     750 mg 150 mL/hr over 60 Minutes Intravenous Every 8 hours 04/12/19 1500 04/13/19 0839   04/10/19 1336  vancomycin (VANCOCIN) IVPB 1000 mg/200 mL premix  Status:  Discontinued     1,000 mg 200 mL/hr over 60 Minutes Intravenous Every 8 hours 04/10/19 1320 04/12/19 1500   04/08/19 1200  cefTRIAXone (ROCEPHIN) 2 g in sodium chloride 0.9 % 100 mL IVPB     2 g 200 mL/hr over 30 Minutes Intravenous Every 12 hours 04/08/19 1013 06/09/19 2359   04/08/19 0530  vancomycin (VANCOCIN) IVPB 1000 mg/200 mL premix  Status:   Discontinued     1,000 mg 200 mL/hr over 60 Minutes Intravenous Every 12 hours 04/07/19 1541 04/10/19 1320   04/07/19 1900  Ampicillin-Sulbactam (UNASYN) 3 g in sodium chloride 0.9 % 100 mL IVPB  Status:  Discontinued     3 g 200 mL/hr over 30 Minutes Intravenous Every 6 hours 04/07/19 1457 04/08/19 1013   04/07/19 1730  vancomycin (VANCOREADY) IVPB 1500 mg/300 mL     1,500 mg 150 mL/hr over 120 Minutes Intravenous  Once 04/07/19 1541 04/07/19 1937   04/04/19 1400  ceFAZolin (ANCEF) IVPB 2g/100 mL premix  Status:  Discontinued     2 g 200 mL/hr over 30 Minutes Intravenous Every 8 hours 04/04/19 1119 04/07/19 1457   04/04/19 0400  Vancomycin (VANCOCIN) 1,250 mg in sodium chloride 0.9 % 250 mL IVPB  Status:  Discontinued     1,250 mg 166.7 mL/hr over 90 Minutes Intravenous Every 12 hours 04/03/19 1635 04/04/19 1159   04/02/19 1800  ceFEPIme (MAXIPIME) 2 g in sodium chloride 0.9 % 100 mL IVPB  Status:  Discontinued     2 g 200 mL/hr over 30 Minutes Intravenous Every 8 hours 04/02/19 1218 04/04/19 1127   04/02/19 1600  vancomycin (VANCOCIN) IVPB 1000 mg/200 mL premix  Status:  Discontinued     1,000 mg 200 mL/hr over 60 Minutes Intravenous Every 12 hours 04/02/19 0100 04/03/19 1635   04/02/19 0200  piperacillin-tazobactam (ZOSYN) IVPB 3.375 g  Status:  Discontinued     3.375 g 12.5 mL/hr over 240 Minutes Intravenous Every 8 hours 04/02/19 0059 04/02/19 1219   04/02/19 0200  vancomycin (VANCOCIN) 1,500 mg in sodium chloride 0.9 % 500 mL IVPB     1,500 mg 250 mL/hr over 120 Minutes Intravenous  Once 04/02/19 0100 04/02/19 0923   04/01/19 2040  ceFAZolin (ANCEF) 2-4 GM/100ML-% IVPB    Note to Pharmacy: [name]   : cabinet override      04/01/19 2040 04/02/19 0844      Subjective: No complaints at this time  Objective: Vitals:   05/31/19 0452 05/31/19 0455 05/31/19 0753 05/31/19 1208  BP:   96/66 96/68  Pulse:   81 89  Resp:   18 18  Temp:  98.2 F (36.8 C) 97.6 F (36.4 C)  98.1 F (36.7 C)  TempSrc:  Oral Axillary Oral  SpO2:    100%  Weight: 81.1 kg     Height:        Intake/Output Summary (Last 24 hours) at 05/31/2019 1421 Last data filed at 05/31/2019 5400 Gross per 24 hour  Intake 480 ml  Output --  Net 480 ml   Filed Weights   05/29/19 0500 05/30/19 0500 05/31/19 0452  Weight: 80.8 kg 80.4 kg 81.1 kg    Examination: General exam: Conversant, in no acute distress Respiratory system: normal chest rise, clear, no audible wheezing  Data Reviewed: I have personally reviewed following labs and imaging studies  CBC: Recent Labs  Lab 05/25/19 0500 05/28/19 0500  WBC 4.6 4.5  HGB 9.5* 9.6*  HCT 31.3* 31.6*  MCV 88.4 88.0  PLT 364 867   Basic Metabolic Panel: Recent Labs  Lab 05/25/19 0500 05/27/19 0445 05/28/19 0500  NA 138 137 136  K 4.5 4.3 4.6  CL 109 107 106  CO2 23 25 23   GLUCOSE 88 86 83  BUN 18 21* 22*  CREATININE 0.64 0.64 0.60  CALCIUM 8.6* 8.7* 8.5*   GFR: Estimated Creatinine Clearance: 110.4 mL/min (by C-G formula based on SCr of 0.6 mg/dL). Liver Function Tests: Recent Labs  Lab 05/25/19 0500 05/28/19 0500  AST 16 21  ALT 18 19  ALKPHOS 67 73  BILITOT 0.3 0.3  PROT 5.8* 6.1*  ALBUMIN 2.7* 2.8*   No results for input(s): LIPASE, AMYLASE in the last 168 hours. No results for input(s): AMMONIA in the last 168 hours. Coagulation Profile: No results for input(s): INR, PROTIME in the last 168 hours. Cardiac Enzymes: No results for input(s): CKTOTAL, CKMB, CKMBINDEX, TROPONINI in the last 168 hours. BNP (last 3 results) No results for input(s): PROBNP in the last 8760 hours. HbA1C: No results for input(s): HGBA1C in the last 72 hours. CBG: No results for input(s): GLUCAP in the last 168 hours. Lipid Profile: No results for input(s): CHOL, HDL, LDLCALC, TRIG, CHOLHDL, LDLDIRECT in the  last 72 hours. Thyroid Function Tests: No results for input(s): TSH, T4TOTAL, FREET4, T3FREE, THYROIDAB in the last 72  hours. Anemia Panel: No results for input(s): VITAMINB12, FOLATE, FERRITIN, TIBC, IRON, RETICCTPCT in the last 72 hours. Sepsis Labs: No results for input(s): PROCALCITON, LATICACIDVEN in the last 168 hours.  No results found for this or any previous visit (from the past 240 hour(s)).   Radiology Studies: No results found.  Scheduled Meds: . aspirin EC  81 mg Oral Daily   Or  . aspirin  81 mg Per Tube Daily  . bisacodyl  10 mg Oral Daily   Or  . bisacodyl  10 mg Rectal Daily  . busPIRone  7.5 mg Oral BID  . Chlorhexidine Gluconate Cloth  6 each Topical Daily  . clopidogrel  75 mg Oral Daily  . [place] [facility]   Does not apply Once  . docusate sodium  200 mg Oral Daily  . escitalopram  10 mg Oral Daily  . feeding supplement (ENSURE ENLIVE)  237 mL Oral BID BM  . ferrous fumarate-b12-vitamic C-folic acid  1 capsule Oral BID PC  . furosemide  20 mg Oral Daily  . metoprolol tartrate  6.25 mg Oral BID  . pantoprazole  40 mg Oral Daily  . pneumococcal 23 valent vaccine  0.5 mL Intramuscular Tomorrow-1000  . potassium chloride  20 mEq Oral TID  . QUEtiapine  25 mg Oral QHS  . sodium chloride flush  3 mL Intravenous Q12H   Continuous Infusions: . sodium chloride    . sodium chloride    . cefTRIAXone (ROCEPHIN)  IV 2 g (05/31/19 1133)  . lactated ringers Stopped (04/30/19 2141)  . vancomycin 1,250 mg (05/31/19 0934)     LOS: 60 days   Rickey Barbara, MD [facility] Pager On Amion  If 7PM-7AM, please contact night-coverage 05/31/2019, 2:21 PM

## 2019-06-01 NOTE — Progress Notes (Signed)
CARDIAC REHAB PHASE   BP:  Supine:   Sitting: 92/63  Standing:      MODE:  Ambulation: to [employer] and back independently  ft   POST:  Rate/Rhythm: 94 SR  BP:  Supine:   Sitting: 115/75  Standing:    SaO2: 99%RA 7035-0093 Observed pt up in hallway walking independently. Just returned from [facility]. Tolerated well. No DOE noted. Pt stated she is learning to pace herself. Likes to get 4 walks in a day. In good spirits. BP improved with walk. To recliner for breakfast.    Luetta Nutting, RN BSN  06/01/2019 8:33 AM

## 2019-06-01 NOTE — Progress Notes (Addendum)
      [address]       [place] [address]             [phone]      34 Days Post-Op Procedure(s) (LRB): AORTIC VALVE REPLACEMENT (AVR) using Edwards PERIMOUNT Magna Ease Pericardial Bioprosthesis - 29 MM Aortic Valve. (N/A) REPAIR OF TRICUSPID (N/A) TRANSESOPHAGEAL ECHOCARDIOGRAM (TEE) (N/A) Subjective: Feels good today. Had a good weekend. No complaints. No pain.   Objective: Vital signs in last 24 hours: Temp:  [97.6 F (36.4 C)-98.3 F (36.8 C)] 98.1 F (36.7 C) (05/24 0740) Pulse Rate:  [81-100] 83 (05/24 0740) Cardiac Rhythm: Normal sinus rhythm (05/24 0730) Resp:  [16-19] 19 (05/24 0740) BP: (90-110)/(62-94) 92/63 (05/24 0740) SpO2:  [95 %-100 %] 95 % (05/24 0740) Weight:  [81 kg] 81 kg (05/24 0455)    Intake/Output from previous day: 05/23 0701 - 05/24 0700 In: 240 [P.O.:240] Out: -  Intake/Output this shift: No intake/output data recorded.  General appearance: alert, cooperative and no distress Heart: regular rate and rhythm, S1, S2 normal, no murmur, click, rub or gallop Lungs: clear to auscultation bilaterally Abdomen: soft, non-tender; bowel sounds normal; no masses,  no organomegaly Extremities: extremities normal, atraumatic, no cyanosis or edema Wound: clean and dry  Lab Results: No results for input(s): WBC, HGB, HCT, PLT in the last 72 hours. BMET: No results for input(s): NA, K, CL, CO2, GLUCOSE, BUN, CREATININE, CALCIUM in the last 72 hours.  PT/INR: No results for input(s): LABPROT, INR in the last 72 hours. ABG    Component Value Date/Time   PHART 7.365 04/28/2019 2205   HCO3 24.5 04/28/2019 2205   TCO2 26 04/28/2019 2205   ACIDBASEDEF 1.0 04/28/2019 2205   O2SAT 97.0 04/28/2019 2205   CBG (last 3)  No results for input(s): GLUCAP in the last 72 hours.  Assessment/Plan: S/P Procedure(s) (LRB): AORTIC VALVE REPLACEMENT (AVR) using Edwards PERIMOUNT Magna Ease Pericardial Bioprosthesis - 29 MM Aortic Valve.  (N/A) REPAIR OF TRICUSPID (N/A) TRANSESOPHAGEAL ECHOCARDIOGRAM (TEE) (N/A)  -POD33 AV replacement (tissue valve) and TV repair for endocarditis.  IV ABX planned through 5/31. No change in mgt from CTS standpoint. Remains hemodynamically stable. WBC 4.5, no recent fevers Off Oxycodone, no pain    LOS: 61 days    Lauren Reyes 06/01/2019  I have seen and examined Lauren Reyes and agree with the above assessment  and plan.  Lauren Ovens MD Beeper [phone] Office [phone] 06/01/2019 3:30 PM

## 2019-06-01 NOTE — Progress Notes (Signed)
PROGRESS NOTE    Lauren Reyes  NUU:[id number] DOB: [birth year] DOA: 04/01/2019 PCP: Lauren Reyes    Brief Narrative:  29 year old female with past medical history of substance abuse with heroin, crystal meth and cocaine who presented to the ER with aphasia and right-sided weakness and had a code stroke subsequently called.  She was admitted to the neurology ICU on 04/01/2019.  She was intubated and underwent thrombectomy for left MCA M3 occlusion and recanalization.  This was unfortunately complicated by dissection and pseudoaneurysm of the left ICA, s/p stent placement.  She was extubated on 04/04/2019.  She was found to have a right common femoral vein DVT along with right groin hematoma which was treated with IVC filter placement.  Patient had a TEE which showed infective endocarditis and CT surgery was consulted.  She is status post bioprosthetic aortic valve replacement and tricuspid valve repair on 04/28/2019 and is on IV antibiotics. CT surgery on board, requested TRH takeover service on 5/6. Plan to complete 6 weeks IV antibiotics while inpatient, last dose 5/31  Assessment & Plan:   Principal Problem:   Endocarditis Active Problems:   Acute ischemic left MCA stroke (HCC)   Middle cerebral artery embolism, left   DVT (deep venous thrombosis) (HCC)   Encounter for central line placement   Endotracheal tube present   Cerebrovascular accident (CVA) due to embolism of precerebral artery (HCC)   Infective endocarditis   Normocytic anemia   Polysubstance abuse (HCC)   IVDU (intravenous drug user)   Tachypnea   1. Culture negative aortic and tricuspid valve endocarditis s/p AV replacement and TV repair 05/04/2019 with Dr. Tyrone Sage a. Currently on IV Rocephin and vancomycin.  will need to remain hospitalized until completion of 6 weeks of therapy on 06/08/2019 due to hx of drug use b. CT surgery on board c. continue metoprolol and Lasix as tolerated d. Remains  stable  2. Acute left-sided MCA embolic stroke s/p IR revascularization (3/24) a. Complicated by dissection and pseudoaneurysm of left ICA s/p placement of stent b. Initially on aspirin/Brilinta however Brilinta held for CT surgery c. Now on aspirin and Plavix dual antiplatelet for total 3 to 6 months d. will need follow up with Dr. Corliss Skains to decide on exact regimen and require outpatient neurology follow up  3. Right common femoral DVT with right groin hematoma s/p IVC filter placement 04/03/2019 a. Not a candidate for anticoagulation given septic emboli, dissection and hematoma as well as dual antiplatelets b. Revisit IVC filter removal plan with IR prior to discharge   4. Acute blood loss anemia, iron deficiency anemia, B12 deficiency and folate deficiency, stable a. Continue supplementation  5. Polysubstance abuse: THC, amphetamine and suspected IV drug use a. Currently stable without any signs of withdrawal b. Continue to encourage illicit drug abstinence at discharge  6. Anxiety, stable on BuSpar, Lexapro and Seroquel  7. Pain control a. Plan to taper oxycodone to daily as needed  8. Sinus tachycardia with soft BP, likely baseline  DVT prophylaxis: Heparin  Code Status: Full Family Communication: Pt in room, family not at bedside  Status is: Inpatient  Remains inpatient appropriate because:Unsafe d/c plan and IV treatments appropriate due to intensity of illness or inability to take PO   Dispo: The patient is from: Home              Anticipated d/c is to: Home              Anticipated d/c date  is: > 3 days, after competing IV abx on 5/31              Patient currently is not medically stable to d/c.   Consultants:  CT surgery Neurology  Infectious disease  Procedures:  IR guided left MCA thrombectomy on 3/24 AV replacement and TV repair on 4/20   Antimicrobials: Anti-infectives (From admission, onward)   Start     Dose/Rate Route Frequency Ordered  Stop   05/22/19 2100  vancomycin (VANCOREADY) IVPB 1250 mg/250 mL     1,250 mg 166.7 mL/hr over 90 Minutes Intravenous Every 12 hours 05/22/19 0937     05/19/19 2100  vancomycin (VANCOCIN) IVPB 1000 mg/200 mL premix  Status:  Discontinued     1,000 mg 200 mL/hr over 60 Minutes Intravenous Every 12 hours 05/19/19 1040 05/22/19 0937   05/03/19 1000  fluconazole (DIFLUCAN) tablet 150 mg     150 mg Oral  Once 05/03/19 0804 05/03/19 1243   04/28/19 0400  vancomycin (VANCOREADY) IVPB 1250 mg/250 mL  Status:  Discontinued     1,250 mg 166.7 mL/hr over 90 Minutes Intravenous To Surgery 04/27/19 1131 04/28/19 1451   04/28/19 0400  cefUROXime (ZINACEF) 1.5 g in sodium chloride 0.9 % 100 mL IVPB     1.5 g 200 mL/hr over 30 Minutes Intravenous To Surgery 04/27/19 1131 04/28/19 0819   04/28/19 0400  cefUROXime (ZINACEF) 750 mg in sodium chloride 0.9 % 100 mL IVPB     750 mg 200 mL/hr over 30 Minutes Intravenous To Surgery 04/27/19 1131 04/28/19 1313   04/15/19 0900  vancomycin (VANCOREADY) IVPB 750 mg/150 mL  Status:  Discontinued     750 mg 150 mL/hr over 60 Minutes Intravenous Every 8 hours 04/15/19 0821 05/19/19 1040   04/13/19 1400  vancomycin (VANCOCIN) IVPB 1000 mg/200 mL premix  Status:  Discontinued     1,000 mg 200 mL/hr over 60 Minutes Intravenous Every 8 hours 04/13/19 0839 04/15/19 0821   04/12/19 2200  vancomycin (VANCOREADY) IVPB 750 mg/150 mL  Status:  Discontinued     750 mg 150 mL/hr over 60 Minutes Intravenous Every 8 hours 04/12/19 1500 04/13/19 0839   04/10/19 1336  vancomycin (VANCOCIN) IVPB 1000 mg/200 mL premix  Status:  Discontinued     1,000 mg 200 mL/hr over 60 Minutes Intravenous Every 8 hours 04/10/19 1320 04/12/19 1500   04/08/19 1200  cefTRIAXone (ROCEPHIN) 2 g in sodium chloride 0.9 % 100 mL IVPB     2 g 200 mL/hr over 30 Minutes Intravenous Every 12 hours 04/08/19 1013 06/09/19 2359   04/08/19 0530  vancomycin (VANCOCIN) IVPB 1000 mg/200 mL premix  Status:   Discontinued     1,000 mg 200 mL/hr over 60 Minutes Intravenous Every 12 hours 04/07/19 1541 04/10/19 1320   04/07/19 1900  Ampicillin-Sulbactam (UNASYN) 3 g in sodium chloride 0.9 % 100 mL IVPB  Status:  Discontinued     3 g 200 mL/hr over 30 Minutes Intravenous Every 6 hours 04/07/19 1457 04/08/19 1013   04/07/19 1730  vancomycin (VANCOREADY) IVPB 1500 mg/300 mL     1,500 mg 150 mL/hr over 120 Minutes Intravenous  Once 04/07/19 1541 04/07/19 1937   04/04/19 1400  ceFAZolin (ANCEF) IVPB 2g/100 mL premix  Status:  Discontinued     2 g 200 mL/hr over 30 Minutes Intravenous Every 8 hours 04/04/19 1119 04/07/19 1457   04/04/19 0400  Vancomycin (VANCOCIN) 1,250 mg in sodium chloride 0.9 % 250 mL IVPB  Status:  Discontinued     1,250 mg 166.7 mL/hr over 90 Minutes Intravenous Every 12 hours 04/03/19 1635 04/04/19 1159   04/02/19 1800  ceFEPIme (MAXIPIME) 2 g in sodium chloride 0.9 % 100 mL IVPB  Status:  Discontinued     2 g 200 mL/hr over 30 Minutes Intravenous Every 8 hours 04/02/19 1218 04/04/19 1127   04/02/19 1600  vancomycin (VANCOCIN) IVPB 1000 mg/200 mL premix  Status:  Discontinued     1,000 mg 200 mL/hr over 60 Minutes Intravenous Every 12 hours 04/02/19 0100 04/03/19 1635   04/02/19 0200  piperacillin-tazobactam (ZOSYN) IVPB 3.375 g  Status:  Discontinued     3.375 g 12.5 mL/hr over 240 Minutes Intravenous Every 8 hours 04/02/19 0059 04/02/19 1219   04/02/19 0200  vancomycin (VANCOCIN) 1,500 mg in sodium chloride 0.9 % 500 mL IVPB     1,500 mg 250 mL/hr over 120 Minutes Intravenous  Once 04/02/19 0100 04/02/19 0923   04/01/19 2040  ceFAZolin (ANCEF) 2-4 GM/100ML-% IVPB    Note to Pharmacy: [name]   : cabinet override      04/01/19 2040 04/02/19 0844      Subjective: Without complaints today  Objective: Vitals:   06/01/19 0455 06/01/19 0740 06/01/19 1029 06/01/19 1132  BP: 92/62 92/63  92/60  Pulse: 89 83 83 86  Resp:  19  16  Temp: 98 F (36.7 C) 98.1 F (36.7  C)  97.9 F (36.6 C)  TempSrc: Oral Oral  Oral  SpO2: 95% 95%  100%  Weight: 81 kg     Height:        Intake/Output Summary (Last 24 hours) at 06/01/2019 1511 Last data filed at 06/01/2019 0800 Gross Reyes 24 hour  Intake 360 ml  Output --  Net 360 ml   Filed Weights   05/30/19 0500 05/31/19 0452 06/01/19 0455  Weight: 80.4 kg 81.1 kg 81 kg    Examination: General exam: Awake, sitting in bed, in nad Respiratory system: Normal respiratory effort, no wheezing  Data Reviewed: I have personally reviewed following labs and imaging studies  CBC: Recent Labs  Lab 05/28/19 0500  WBC 4.5  HGB 9.6*  HCT 31.6*  MCV 88.0  PLT 737   Basic Metabolic Panel: Recent Labs  Lab 05/27/19 0445 05/28/19 0500  NA 137 136  K 4.3 4.6  CL 107 106  CO2 25 23  GLUCOSE 86 83  BUN 21* 22*  CREATININE 0.64 0.60  CALCIUM 8.7* 8.5*   GFR: Estimated Creatinine Clearance: 110.4 mL/min (by C-G formula based on SCr of 0.6 mg/dL). Liver Function Tests: Recent Labs  Lab 05/28/19 0500  AST 21  ALT 19  ALKPHOS 73  BILITOT 0.3  PROT 6.1*  ALBUMIN 2.8*   No results for input(s): LIPASE, AMYLASE in the last 168 hours. No results for input(s): AMMONIA in the last 168 hours. Coagulation Profile: No results for input(s): INR, PROTIME in the last 168 hours. Cardiac Enzymes: No results for input(s): CKTOTAL, CKMB, CKMBINDEX, TROPONINI in the last 168 hours. BNP (last 3 results) No results for input(s): PROBNP in the last 8760 hours. HbA1C: No results for input(s): HGBA1C in the last 72 hours. CBG: No results for input(s): GLUCAP in the last 168 hours. Lipid Profile: No results for input(s): CHOL, HDL, LDLCALC, TRIG, CHOLHDL, LDLDIRECT in the last 72 hours. Thyroid Function Tests: No results for input(s): TSH, T4TOTAL, FREET4, T3FREE, THYROIDAB in the last 72 hours. Anemia Panel: No results for input(s):  VITAMINB12, FOLATE, FERRITIN, TIBC, IRON, RETICCTPCT in the last 72 hours. Sepsis  Labs: No results for input(s): PROCALCITON, LATICACIDVEN in the last 168 hours.  No results found for this or any previous visit (from the past 240 hour(s)).   Radiology Studies: No results found.  Scheduled Meds: . aspirin EC  81 mg Oral Daily   Or  . aspirin  81 mg Reyes Tube Daily  . bisacodyl  10 mg Oral Daily   Or  . bisacodyl  10 mg Rectal Daily  . busPIRone  7.5 mg Oral BID  . Chlorhexidine Gluconate Cloth  6 each Topical Daily  . clopidogrel  75 mg Oral Daily  . [facility]ell Cardiac Surgery, Patient & Family Education   Does not apply Once  . docusate sodium  200 mg Oral Daily  . escitalopram  10 mg Oral Daily  . feeding supplement (ENSURE ENLIVE)  237 mL Oral BID BM  . ferrous fumarate-b12-vitamic C-folic acid  1 capsule Oral BID PC  . furosemide  20 mg Oral Daily  . metoprolol tartrate  6.25 mg Oral BID  . pantoprazole  40 mg Oral Daily  . pneumococcal 23 valent vaccine  0.5 mL Intramuscular Tomorrow-1000  . potassium chloride  20 mEq Oral TID  . QUEtiapine  25 mg Oral QHS  . sodium chloride flush  3 mL Intravenous Q12H   Continuous Infusions: . sodium chloride    . sodium chloride    . cefTRIAXone (ROCEPHIN)  IV 2 g (06/01/19 1207)  . lactated ringers Stopped (04/30/19 2141)  . vancomycin 1,250 mg (06/01/19 1034)     LOS: 61 days   Rickey Barbara, MD [facility] Pager On Amion  If 7PM-7AM, please contact night-coverage 06/01/2019, 3:11 PM

## 2019-06-01 NOTE — Progress Notes (Signed)
Mobility Specialist - Progress Note   06/01/19 1706  Mobility  Activity Refused mobility  Mobility performed by Mobility specialist  $Mobility charge 1 Mobility   Pt refused mobility due to feeling lethargic.   Mamie Levers Mobility Specialist

## 2019-06-02 LAB — ECHO INTRAOPERATIVE TEE
Height: 65 in
LVOT diameter: 20 mm
MV Vena cont: 0.5 cm
Mean grad: 2 mmHg
Reg vol: 0.9 cc
Weight: 2550.28 oz

## 2019-06-02 NOTE — Progress Notes (Signed)
Nutrition Follow-up  DOCUMENTATION CODES:   Not applicable  INTERVENTION:   -Continue Ensure Enlive po BID, each supplement provides 350 kcal and 20 grams of protein -Continue MVI with minerals daily  NUTRITION DIAGNOSIS:   Increased nutrient needs related to acute illness as evidenced by estimated needs.  Ongoing  GOAL:   Patient will meet greater than or equal to 90% of their needs  Meeting  MONITOR:   I & O's, Labs, Supplement acceptance, PO intake, Weight trends  REASON FOR ASSESSMENT:   Ventilator    ASSESSMENT:   Pt with suspected IV drug abuse who while being interviewed by police developed left-sided weakness admitted with L MCA s/p IR for revascularization and L ICA stent.  3/24 - OG tube placed, intubated 3/27 - OG removed, extubated 3/29 - Regular diet ordered 3/30 - s/pTEE 4/20 - endocaditis with AV replacement, tricuspid valve repair, TEE  Pt endorses appetite remains stable. Meal completions charted as 100% for her last eight meals. Drinking Ensure BID. Plan for d/c next week.   Admission weight: 72.3 kg  Current weight: 81.8 kg (stable over the last week)  Medications: dulcolax, colace, trinsicon, 20 mg lasix daily, 20 mEq KCl TID Labs: reviewed   Diet Order:   Diet Order            Diet 2 gram sodium Room service appropriate? Yes; Fluid consistency: Thin  Diet effective now              EDUCATION NEEDS:   No education needs have been identified at this time  Skin:  Skin Assessment: Skin Integrity Issues: Skin Integrity Issues:: Incisions, Other (Comment) Incisions: chest Other: MASD- intergluteal cleft  Last BM:  5/23  Height:   Ht Readings from Last 1 Encounters:  05/01/19 5\' 6"  (1.676 m)    Weight:   Wt Readings from Last 1 Encounters:  06/02/19 81.8 kg    Ideal Body Weight:  56.8 kg  BMI:  Body mass index is 29.1 kg/m.  Estimated Nutritional Needs:   Kcal:  1900-2100  Protein:  100-115 grams  Fluid:  >  1.9 L/day  06/04/19 RD, LDN Clinical Nutrition Pager listed in AMION

## 2019-06-02 NOTE — Progress Notes (Addendum)
      [address]       [place] [address]             [phone]      35 Days Post-Op Procedure(s) (LRB): AORTIC VALVE REPLACEMENT (AVR) using Edwards PERIMOUNT Magna Ease Pericardial Bioprosthesis - 29 MM Aortic Valve. (N/A) REPAIR OF TRICUSPID (N/A) TRANSESOPHAGEAL ECHOCARDIOGRAM (TEE) (N/A) Subjective: Feels good, no complaints  Objective: Vital signs in last 24 hours: Temp:  [97.4 F (36.3 C)-98.1 F (36.7 C)] 98 F (36.7 C) (05/25 0603) Pulse Rate:  [83-98] 97 (05/25 0603) Cardiac Rhythm: Normal sinus rhythm (05/25 0700) Resp:  [16-19] 18 (05/25 0603) BP: (91-107)/(59-79) 94/60 (05/25 0603) SpO2:  [95 %-100 %] 100 % (05/25 0603) Weight:  [81.8 kg] 81.8 kg (05/25 0603)     Intake/Output from previous day: 05/24 0701 - 05/25 0700 In: 480 [P.O.:480] Out: -  Intake/Output this shift: No intake/output data recorded.  General appearance: alert, cooperative and no distress Heart: regular rate and rhythm, S1, S2 normal, no murmur, click, rub or gallop Lungs: clear to auscultation bilaterally Abdomen: soft, non-tender; bowel sounds normal; no masses,  no organomegaly Extremities: extremities normal, atraumatic, no cyanosis or edema Wound: clean and dry  Lab Results: No results for input(s): WBC, HGB, HCT, PLT in the last 72 hours. BMET: No results for input(s): NA, K, CL, CO2, GLUCOSE, BUN, CREATININE, CALCIUM in the last 72 hours.  PT/INR: No results for input(s): LABPROT, INR in the last 72 hours. ABG    Component Value Date/Time   PHART 7.365 04/28/2019 2205   HCO3 24.5 04/28/2019 2205   TCO2 26 04/28/2019 2205   ACIDBASEDEF 1.0 04/28/2019 2205   O2SAT 97.0 04/28/2019 2205   CBG (last 3)  No results for input(s): GLUCAP in the last 72 hours.  Assessment/Plan: S/P Procedure(s) (LRB): AORTIC VALVE REPLACEMENT (AVR) using Edwards PERIMOUNT Magna Ease Pericardial Bioprosthesis - 29 MM Aortic Valve. (N/A) REPAIR OF TRICUSPID  (N/A) TRANSESOPHAGEAL ECHOCARDIOGRAM (TEE) (N/A)  -POD34 AV replacement (tissue valve) and TV repair for endocarditis.  IV ABX planned through 5/31. No change in mgt from CTS standpoint.Remains hemodynamically stable. WBC 4.5, no recent fevers Off Oxycodone, no pain  Plan: continue medical care. Discharge will be next week therefore, IR will need contacted about her IVC filter. Case management to assist in arranging outpatient resources for outpatient drug therapy.    LOS: 62 days    Lauren Reyes 06/02/2019  Now 35 days post op  Will need cardiology, neurology ,  interventional radiology and infectious disease follow up after discharge  I have seen and examined Lauren Reyes and agree with the above assessment  and plan.  Delight Ovens MD Beeper [phone] Office [phone] 06/02/2019 9:23 AM

## 2019-06-02 NOTE — Progress Notes (Signed)
PROGRESS NOTE    Lauren Reyes  NOB:[id number] DOB: 12/09/1990 DOA: 04/01/2019 PCP: Patient, No Pcp Per    Brief Narrative:  29 year old female with past medical history of substance abuse with heroin, crystal meth and cocaine who presented to the ER with aphasia and right-sided weakness and had a code stroke subsequently called.  She was admitted to the neurology ICU on 04/01/2019.  She was intubated and underwent thrombectomy for left MCA M3 occlusion and recanalization.  This was unfortunately complicated by dissection and pseudoaneurysm of the left ICA, s/p stent placement.  She was extubated on 04/04/2019.  She was found to have a right common femoral vein DVT along with right groin hematoma which was treated with IVC filter placement.  Patient had a TEE which showed infective endocarditis and CT surgery was consulted.  She is status post bioprosthetic aortic valve replacement and tricuspid valve repair on 04/28/2019 and is on IV antibiotics. CT surgery on board, requested TRH takeover service on 5/6. Plan to complete 6 weeks IV antibiotics while inpatient, last dose 5/31  Assessment & Plan:   Principal Problem:   Endocarditis Active Problems:   Acute ischemic left MCA stroke (HCC)   Middle cerebral artery embolism, left   DVT (deep venous thrombosis) (HCC)   Encounter for central line placement   Endotracheal tube present   Cerebrovascular accident (CVA) due to embolism of precerebral artery (HCC)   Infective endocarditis   Normocytic anemia   Polysubstance abuse (HCC)   IVDU (intravenous drug user)   Tachypnea   1. Culture negative aortic and tricuspid valve endocarditis s/p AV replacement and TV repair 05/04/2019 with Dr. Tyrone Sage a. Currently on IV Rocephin and vancomycin.  will need to remain hospitalized until completion of 6 weeks of therapy on 06/08/2019 due to hx of drug use b. CT surgery on board and following c. continue metoprolol and Lasix as  tolerated d. Remains stable  2. Acute left-sided MCA embolic stroke s/p IR revascularization (3/24) a. Complicated by dissection and pseudoaneurysm of left ICA s/p placement of stent b. Initially on aspirin/Brilinta however Brilinta held for CT surgery c. Now on aspirin and Plavix dual antiplatelet for total 3 to 6 months d. will need follow up with Dr. Corliss Skains to decide on exact regimen and require outpatient neurology follow up  3. Right common femoral DVT with right groin hematoma s/p IVC filter placement 04/03/2019 a. Not a candidate for anticoagulation given septic emboli, dissection and hematoma as well as dual antiplatelets b. Revisit IVC filter removal plan with IR prior to discharge   4. Acute blood loss anemia, iron deficiency anemia, B12 deficiency and folate deficiency, stable a. Continue supplementation  5. Polysubstance abuse: THC, amphetamine and suspected IV drug use a. Currently stable without any signs of withdrawal b. Continue to encourage illicit drug abstinence at discharge  6. Anxiety, stable on BuSpar, Lexapro and Seroquel  7. Pain control a. Plan to taper oxycodone to daily as needed  8. Sinus tachycardia with soft BP, likely baseline  DVT prophylaxis: Heparin  Code Status: Full Family Communication: Pt in room, family not at bedside  Status is: Inpatient  Remains inpatient appropriate because:Unsafe d/c plan and IV treatments appropriate due to intensity of illness or inability to take PO   Dispo: The patient is from: Home              Anticipated d/c is to: Home              Anticipated  d/c date is: > 3 days, after competing IV abx on 5/31              Patient currently is not medically stable to d/c.   Consultants:  CT surgery Neurology  Infectious disease  Procedures:  IR guided left MCA thrombectomy on 3/24 AV replacement and TV repair on 4/20   Antimicrobials: Anti-infectives (From admission, onward)   Start     Dose/Rate  Route Frequency Ordered Stop   05/22/19 2100  vancomycin (VANCOREADY) IVPB 1250 mg/250 mL     1,250 mg 166.7 mL/hr over 90 Minutes Intravenous Every 12 hours 05/22/19 0937     05/19/19 2100  vancomycin (VANCOCIN) IVPB 1000 mg/200 mL premix  Status:  Discontinued     1,000 mg 200 mL/hr over 60 Minutes Intravenous Every 12 hours 05/19/19 1040 05/22/19 0937   05/03/19 1000  fluconazole (DIFLUCAN) tablet 150 mg     150 mg Oral  Once 05/03/19 0804 05/03/19 1243   04/28/19 0400  vancomycin (VANCOREADY) IVPB 1250 mg/250 mL  Status:  Discontinued     1,250 mg 166.7 mL/hr over 90 Minutes Intravenous To Surgery 04/27/19 1131 04/28/19 1451   04/28/19 0400  cefUROXime (ZINACEF) 1.5 g in sodium chloride 0.9 % 100 mL IVPB     1.5 g 200 mL/hr over 30 Minutes Intravenous To Surgery 04/27/19 1131 04/28/19 0819   04/28/19 0400  cefUROXime (ZINACEF) 750 mg in sodium chloride 0.9 % 100 mL IVPB     750 mg 200 mL/hr over 30 Minutes Intravenous To Surgery 04/27/19 1131 04/28/19 1313   04/15/19 0900  vancomycin (VANCOREADY) IVPB 750 mg/150 mL  Status:  Discontinued     750 mg 150 mL/hr over 60 Minutes Intravenous Every 8 hours 04/15/19 0821 05/19/19 1040   04/13/19 1400  vancomycin (VANCOCIN) IVPB 1000 mg/200 mL premix  Status:  Discontinued     1,000 mg 200 mL/hr over 60 Minutes Intravenous Every 8 hours 04/13/19 0839 04/15/19 0821   04/12/19 2200  vancomycin (VANCOREADY) IVPB 750 mg/150 mL  Status:  Discontinued     750 mg 150 mL/hr over 60 Minutes Intravenous Every 8 hours 04/12/19 1500 04/13/19 0839   04/10/19 1336  vancomycin (VANCOCIN) IVPB 1000 mg/200 mL premix  Status:  Discontinued     1,000 mg 200 mL/hr over 60 Minutes Intravenous Every 8 hours 04/10/19 1320 04/12/19 1500   04/08/19 1200  cefTRIAXone (ROCEPHIN) 2 g in sodium chloride 0.9 % 100 mL IVPB     2 g 200 mL/hr over 30 Minutes Intravenous Every 12 hours 04/08/19 1013 06/09/19 2359   04/08/19 0530  vancomycin (VANCOCIN) IVPB 1000 mg/200 mL  premix  Status:  Discontinued     1,000 mg 200 mL/hr over 60 Minutes Intravenous Every 12 hours 04/07/19 1541 04/10/19 1320   04/07/19 1900  Ampicillin-Sulbactam (UNASYN) 3 g in sodium chloride 0.9 % 100 mL IVPB  Status:  Discontinued     3 g 200 mL/hr over 30 Minutes Intravenous Every 6 hours 04/07/19 1457 04/08/19 1013   04/07/19 1730  vancomycin (VANCOREADY) IVPB 1500 mg/300 mL     1,500 mg 150 mL/hr over 120 Minutes Intravenous  Once 04/07/19 1541 04/07/19 1937   04/04/19 1400  ceFAZolin (ANCEF) IVPB 2g/100 mL premix  Status:  Discontinued     2 g 200 mL/hr over 30 Minutes Intravenous Every 8 hours 04/04/19 1119 04/07/19 1457   04/04/19 0400  Vancomycin (VANCOCIN) 1,250 mg in sodium chloride 0.9 % 250 mL  IVPB  Status:  Discontinued     1,250 mg 166.7 mL/hr over 90 Minutes Intravenous Every 12 hours 04/03/19 1635 04/04/19 1159   04/02/19 1800  ceFEPIme (MAXIPIME) 2 g in sodium chloride 0.9 % 100 mL IVPB  Status:  Discontinued     2 g 200 mL/hr over 30 Minutes Intravenous Every 8 hours 04/02/19 1218 04/04/19 1127   04/02/19 1600  vancomycin (VANCOCIN) IVPB 1000 mg/200 mL premix  Status:  Discontinued     1,000 mg 200 mL/hr over 60 Minutes Intravenous Every 12 hours 04/02/19 0100 04/03/19 1635   04/02/19 0200  piperacillin-tazobactam (ZOSYN) IVPB 3.375 g  Status:  Discontinued     3.375 g 12.5 mL/hr over 240 Minutes Intravenous Every 8 hours 04/02/19 0059 04/02/19 1219   04/02/19 0200  vancomycin (VANCOCIN) 1,500 mg in sodium chloride 0.9 % 500 mL IVPB     1,500 mg 250 mL/hr over 120 Minutes Intravenous  Once 04/02/19 0100 04/02/19 0923   04/01/19 2040  ceFAZolin (ANCEF) 2-4 GM/100ML-% IVPB    Note to Pharmacy: [name]   : cabinet override      04/01/19 2040 04/02/19 0844      Subjective: No complaints today  Objective: Vitals:   06/01/19 2318 06/02/19 0603 06/02/19 0849 06/02/19 1130  BP: 106/79 94/60 100/83 (!) 89/65  Pulse: 98 97 100 96  Resp: 16 18 18    Temp: (!)  97.4 F (36.3 C) 98 F (36.7 C) 97.6 F (36.4 C) 98.1 F (36.7 C)  TempSrc: Oral Oral Oral Oral  SpO2: 100% 100% 94% 99%  Weight:  81.8 kg    Height:        Intake/Output Summary (Last 24 hours) at 06/02/2019 1647 Last data filed at 06/02/2019 1350 Gross per 24 hour  Intake 600 ml  Output --  Net 600 ml   Filed Weights   05/31/19 0452 06/01/19 0455 06/02/19 0603  Weight: 81.1 kg 81 kg 81.8 kg    Examination: General exam: Conversant, in no acute distress Respiratory system: normal chest rise, clear, no audible wheezing  Data Reviewed: I have personally reviewed following labs and imaging studies  CBC: Recent Labs  Lab 05/28/19 0500  WBC 4.5  HGB 9.6*  HCT 31.6*  MCV 88.0  PLT 275   Basic Metabolic Panel: Recent Labs  Lab 05/27/19 0445 05/28/19 0500  NA 137 136  K 4.3 4.6  CL 107 106  CO2 25 23  GLUCOSE 86 83  BUN 21* 22*  CREATININE 0.64 0.60  CALCIUM 8.7* 8.5*   GFR: Estimated Creatinine Clearance: 110.9 mL/min (by C-G formula based on SCr of 0.6 mg/dL). Liver Function Tests: Recent Labs  Lab 05/28/19 0500  AST 21  ALT 19  ALKPHOS 73  BILITOT 0.3  PROT 6.1*  ALBUMIN 2.8*   No results for input(s): LIPASE, AMYLASE in the last 168 hours. No results for input(s): AMMONIA in the last 168 hours. Coagulation Profile: No results for input(s): INR, PROTIME in the last 168 hours. Cardiac Enzymes: No results for input(s): CKTOTAL, CKMB, CKMBINDEX, TROPONINI in the last 168 hours. BNP (last 3 results) No results for input(s): PROBNP in the last 8760 hours. HbA1C: No results for input(s): HGBA1C in the last 72 hours. CBG: No results for input(s): GLUCAP in the last 168 hours. Lipid Profile: No results for input(s): CHOL, HDL, LDLCALC, TRIG, CHOLHDL, LDLDIRECT in the last 72 hours. Thyroid Function Tests: No results for input(s): TSH, T4TOTAL, FREET4, T3FREE, THYROIDAB in the last  72 hours. Anemia Panel: No results for input(s): VITAMINB12, FOLATE,  FERRITIN, TIBC, IRON, RETICCTPCT in the last 72 hours. Sepsis Labs: No results for input(s): PROCALCITON, LATICACIDVEN in the last 168 hours.  No results found for this or any previous visit (from the past 240 hour(s)).   Radiology Studies: No results found.  Scheduled Meds: . aspirin EC  81 mg Oral Daily   Or  . aspirin  81 mg Per Tube Daily  . bisacodyl  10 mg Oral Daily   Or  . bisacodyl  10 mg Rectal Daily  . busPIRone  7.5 mg Oral BID  . Chlorhexidine Gluconate Cloth  6 each Topical Daily  . clopidogrel  75 mg Oral Daily  . [facility] Patient [facility] Family Education   Does not apply Once  . docusate sodium  200 mg Oral Daily  . escitalopram  10 mg Oral Daily  . feeding supplement (ENSURE ENLIVE)  237 mL Oral BID BM  . ferrous fumarate-b12-vitamic C-folic acid  1 capsule Oral BID PC  . furosemide  20 mg Oral Daily  . metoprolol tartrate  6.25 mg Oral BID  . pantoprazole  40 mg Oral Daily  . pneumococcal 23 valent vaccine  0.5 mL Intramuscular Tomorrow-1000  . potassium chloride  20 mEq Oral TID  . QUEtiapine  25 mg Oral QHS  . sodium chloride flush  3 mL Intravenous Q12H   Continuous Infusions: . sodium chloride    . sodium chloride    . cefTRIAXone (ROCEPHIN)  IV 2 g (06/02/19 1124)  . lactated ringers Stopped (04/30/19 2141)  . vancomycin 1,250 mg (06/02/19 0941)     LOS: 62 days   Rickey Barbara, MD [facility] Pager On Amion  If 7PM-7AM, please contact night-coverage 06/02/2019, 4:47 PM

## 2019-06-02 NOTE — Progress Notes (Signed)
Mobility Specialist - Progress Note   06/02/19 1516  Mobility  Activity Ambulated in hall  Level of Assistance Independent  Assistive Device None  Distance Ambulated (ft) 2000 ft  Mobility Response Tolerated well  Mobility performed by Mobility specialist  $Mobility charge 1 Mobility    Pre-mobility: 100 HR Post-mobility: 112 HR  Pt was escorted back into her room, where she said she was going to take a nap. She is independent and oriented to her room.  Mamie Levers Mobility Specialist

## 2019-06-03 DIAGNOSIS — F329 Major depressive disorder, single episode, unspecified: Secondary | ICD-10-CM

## 2019-06-03 DIAGNOSIS — I959 Hypotension, unspecified: Secondary | ICD-10-CM

## 2019-06-03 LAB — BASIC METABOLIC PANEL
Anion gap: 8 (ref 5–15)
BUN: 21 mg/dL — ABNORMAL HIGH (ref 6–20)
CO2: 23 mmol/L (ref 22–32)
Calcium: 8.6 mg/dL — ABNORMAL LOW (ref 8.9–10.3)
Chloride: 106 mmol/L (ref 98–111)
Creatinine, Ser: 0.64 mg/dL (ref 0.44–1.00)
GFR calc Af Amer: >60 mL/min (ref >60)
GFR calc non Af Amer: >60 mL/min (ref >60)
Glucose, Bld: 91 mg/dL (ref 70–99)
Potassium: 4.3 mmol/L (ref 3.5–5.1)
Sodium: 137 mmol/L (ref 135–145)

## 2019-06-03 LAB — VANCOMYCIN, TROUGH: Vancomycin Tr: 16 ug/mL (ref 15–20)

## 2019-06-03 MED ORDER — ENOXAPARIN SODIUM 40 MG/0.4ML ~~LOC~~ SOLN: 40.0000 mg | SUBCUTANEOUS | Status: DC

## 2019-06-03 NOTE — Progress Notes (Signed)
CARDIAC REHAB PHASE I   PRE:  Rate/Rhythm: 90SR  BP:  Supine:   Sitting: 95/60  Standing:    SaO2: 100%RA  MODE:  Ambulation: 1800 ft   POST:  Rate/Rhythm: 117 ST  BP:  Supine:   Sitting: 111/79  Standing:    SaO2: 100%RA 1447-1517 Pt walked 1800 ft on RA and no complaints. Gait steady. Tolerated well.   Luetta Nutting, RN BSN  06/03/2019 3:14 PM

## 2019-06-03 NOTE — Progress Notes (Signed)
Pharmacy Antibiotic Note Lauren Reyes is a 29 y.o. female with endocarditis involving aortic, mitral, and tricuspid valves with severe aortic regurgitation. Patient's condition is further complicated by septic emboli to her brain, lungs, and spleen  Aortic valve tissue culture showing gram stain of rare gram positive cocci. She is s/p AVR/TVRepair on 4/20. Vancomycin doses are being adjusted on troughs alone due to concern of cerebritis.   Vancomycin trough is therapeutic today at 16 on vancomycin 1250 mg IV every 12 hours.   SCr 0.64 stable.   Plan: Continue Vancomycin 1250 mg iv Q 12 hours Continues ceftriaxone IV 2g q12 per ID Monitor renal function, clinical status End date for antibiotics: 06/08/19  Height: 5\' 6"  (167.6 cm) Weight: 82.1 kg (180 lb 14.4 oz) IBW/kg (Calculated) : 59.3  Temp (24hrs), Avg:98.1 F (36.7 C), Min:97.7 F (36.5 C), Max:98.5 F (36.9 C)  Recent Labs  Lab 05/28/19 0500 06/03/19 0643 06/03/19 0820  WBC 4.5  --   --   CREATININE 0.60 0.64  --   VANCOTROUGH  --   --  16    Estimated Creatinine Clearance: 111 mL/min (by C-G formula based on SCr of 0.64 mg/dL).    No Known Allergies  Antimicrobials this admission: Zosyn 3/25 >> 3/25 Cefepime 3/25 >> 3/27 Ancef 3/27 >> 3/30 Vanc 3/25 >> 3/27; restarted 3/30 >> (5/31) Unasyn 3/30>>3/31 Ceftriaxone 3/31>> (5/31) Flucon 4/25 x1 Clotrimazole cream 4/25> 5/1   Microbiology results: 3/24 COVID/Flu >> neg 3/25 RCx >> moderate MSSA 3/25 MRSA PCR >> negative 3/25 BCx >> negative  4/16 MRSA PCR >> neg 4/20 MRSA PCR >> neg 4/20 Tricuspid valve specimen: neg 4/20 pericardial fluid: neg 4/20 aortic valve leaflets: 1 with rare gram positive cocci 4/21 CMV: negative 4/28 BCx: Bcx x 2: negative  Thank you for allowing pharmacy to be a part of this patient's care.  5/28, RPh Clinical Pharmacist Please check AMION for all [facility] Pharmacy numbers 06/03/2019 10:02 AM

## 2019-06-03 NOTE — Progress Notes (Signed)
      [address]       [place] [address]             [phone]      36 Days Post-Op Procedure(s) (LRB): AORTIC VALVE REPLACEMENT (AVR) using Edwards PERIMOUNT Magna Ease Pericardial Bioprosthesis - 29 MM Aortic Valve. (N/A) REPAIR OF TRICUSPID (N/A) TRANSESOPHAGEAL ECHOCARDIOGRAM (TEE) (N/A) Subjective: Feels good this morning. No complaints.   Objective: Vital signs in last 24 hours: Temp:  [97.6 F (36.4 C)-98.5 F (36.9 C)] 98.2 F (36.8 C) (05/26 0653) Pulse Rate:  [80-100] 80 (05/26 0653) Cardiac Rhythm: Sinus tachycardia (05/25 1900) Resp:  [13-18] 16 (05/26 0653) BP: (87-100)/(40-83) 96/65 (05/26 0653) SpO2:  [94 %-100 %] 100 % (05/26 0653) Weight:  [82.1 kg] 82.1 kg (05/26 0500)     Intake/Output from previous day: 05/25 0701 - 05/26 0700 In: 950 [P.O.:600; IV Piggyback:350] Out: -  Intake/Output this shift: No intake/output data recorded.  General appearance: alert, cooperative and no distress Heart: regular rate and rhythm, S1, S2 normal, no murmur, click, rub or gallop Lungs: clear to auscultation bilaterally Abdomen: soft, non-tender; bowel sounds normal; no masses,  no organomegaly Extremities: extremities normal, atraumatic, no cyanosis or edema Wound: clean and dry  Lab Results: No results for input(s): WBC, HGB, HCT, PLT in the last 72 hours. BMET:  Recent Labs    06/03/19 0643  NA 137  K 4.3  CL 106  CO2 23  GLUCOSE 91  BUN 21*  CREATININE 0.64  CALCIUM 8.6*    PT/INR: No results for input(s): LABPROT, INR in the last 72 hours. ABG    Component Value Date/Time   PHART 7.365 04/28/2019 2205   HCO3 24.5 04/28/2019 2205   TCO2 26 04/28/2019 2205   ACIDBASEDEF 1.0 04/28/2019 2205   O2SAT 97.0 04/28/2019 2205   CBG (last 3)  No results for input(s): GLUCAP in the last 72 hours.  Assessment/Plan: S/P Procedure(s) (LRB): AORTIC VALVE REPLACEMENT (AVR) using Edwards PERIMOUNT Magna Ease Pericardial Bioprosthesis  - 29 MM Aortic Valve. (N/A) REPAIR OF TRICUSPID (N/A) TRANSESOPHAGEAL ECHOCARDIOGRAM (TEE) (N/A)  -[id number] replacement (tissue valve) and TV repair for endocarditis.  IV ABX planned through 5/31. No change in mgt from CTS standpoint.Remains hemodynamically stable. WBC 4.5, no recent fevers Off Oxycodone, no pain  Plan: continue medical care. Discharge will be next week therefore, IR will need contacted about her IVC filter. Case management to assist in arranging outpatient resources for outpatient drug therapy. She will need follow-up appointments arranged from interventional radiology, cardiology and infectious disease.    LOS: 63 days    [name] 06/03/2019

## 2019-06-03 NOTE — Progress Notes (Signed)
PROGRESS NOTE  Lauren Reyes VFI:[id number] DOB: February 01, 1990   PCP: Patient, No Pcp Per  Patient is from: Home  DOA: 04/01/2019 LOS: 63  Brief Narrative / Interim history: 29 year old female with past medical history of substance abuse with heroin, crystal meth and cocaine who presented to the ER with aphasia and right-sided weakness and had a code stroke subsequently called. She was admitted to the neurology ICU on 04/01/2019. She was intubated and underwent thrombectomy for left MCA M3 occlusion and recanalization. This was unfortunately complicated by dissection and pseudoaneurysm of the left ICA, s/p stent placement. She was extubated on 04/04/2019. She was found to have a right common femoral vein DVT along with right groin hematoma which was treated with IVC filter placement. Patient had a TEE which showed infective endocarditis and CT surgery was consulted. She is status post bioprosthetic aortic valve replacement and tricuspid valve repair on 04/28/2019 and is on IV antibiotics. CT surgery on board, requested TRH takeover service on 5/6. Plan to complete 6 weeks IV antibiotics while inpatient, last dose 5/31  Subjective: Seen and examined earlier this morning.  No major events overnight of this morning.  No complaints.  She denies chest pain, dyspnea, GI or UTI symptoms.  Denies dizziness or palpitation.  Objective: Vitals:   06/02/19 2214 06/03/19 0500 06/03/19 0653 06/03/19 0929  BP: (!) 87/57  96/65 98/78  Pulse: 90  80 99  Resp: 13  16 17   Temp: 98.5 F (36.9 C)  98.2 F (36.8 C) 97.7 F (36.5 C)  TempSrc: Oral  Oral Oral  SpO2: 100%  100% 100%  Weight:  82.1 kg    Height:        Intake/Output Summary (Last 24 hours) at 06/03/2019 1454 Last data filed at 06/03/2019 1037 Gross per 24 hour  Intake 360 ml  Output --  Net 360 ml   Filed Weights   06/01/19 0455 06/02/19 0603 06/03/19 0500  Weight: 81 kg 81.8 kg 82.1 kg    Examination:  GENERAL: No  apparent distress.  Nontoxic. HEENT: MMM.  Vision and hearing grossly intact.  NECK: Supple.  No apparent JVD.  RESP:  No IWOB.  Fair aeration bilaterally. CVS:  RRR. Heart sounds normal.  ABD/GI/GU: BS+. Abd soft, NTND.  MSK/EXT:  Moves extremities. No apparent deformity. No edema.  SKIN: no apparent skin lesion or wound NEURO: Awake, alert and oriented appropriately.  No apparent focal neuro deficit. PSYCH: Calm. Normal affect.  Procedures:  3/24-left MCA thrombectomy by IR 4/20-AV replacement and TV repair by CTS  Microbiology summarized: 3/24-COVID-19 and influenza A/B PCR negative. 3/25 and 4/20-MRSA PCR negative. 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 -s/p AV replacement and TV repair on 4/26 by Dr. 5/26 -On IV Rocephin and vancomycin for 6 weeks through 06/08/2019 due to history of IVDU -Continue p.o. Lasix 20 mg daily  Acute left MCA embolic CVA s/p thrombectomy with revascularization on 3/24 by IR Left ICA dissection/pseudoaneurysm s/p stent placement-thought to be complication -On aspirin and Plavix for a total of 3 to 6 months -Outpatient follow-up with Dr. 4/24  -Outpatient follow-up with neurology   Right common femoral DVT with right groin hematoma s/p IVC filter placement 04/03/2019 -Not a candidate for [facility] given septic emboli, dissection and hematoma as  well as dual antiplatelets -IR consulted to revisit for IVC filter, and outpatient follow-up  ABLA/IDA/B12 and folate deficiency, stable -Continue supplementations  Polysubstance abuse: THC, amphetamine and suspected IVDU -No withdrawal symptoms -Continue counseling  Anxiety: Stable  -Continue  BuSpar, Lexapro and Seroquel  Hypotension: Mild.  Asymptomatic. -Continue monitoring   Nutrition Problem: Increased nutrient needs Etiology: acute illness Signs/Symptoms: estimated needs Interventions: Ensure Enlive (each supplement provides 350kcal and 20 grams of protein)   DVT prophylaxis: Start subcu Lovenox Code Status: Full code Family Communication: Patient and/or RN. Available if any question.  Status is: Inpatient  Remains inpatient appropriate because:IV treatments appropriate due to intensity of illness or inability to take PO   Dispo: The patient is from: Home              Anticipated d/c is to: Home              Anticipated d/c date is: > 3 days              Patient currently is not medically stable to d/c.        Consultants:  Cardiothoracic surgery IR Infectious disease Cardiology   Sch Meds:  Scheduled Meds: . aspirin EC  81 mg Oral Daily   Or  . aspirin  81 mg Per Tube Daily  . bisacodyl  10 mg Oral Daily   Or  . bisacodyl  10 mg Rectal Daily  . busPIRone  7.5 mg Oral BID  . Chlorhexidine Gluconate Cloth  6 each Topical Daily  . clopidogrel  75 mg Oral Daily  . [place] [facility], Patient & Family Education   Does not apply Once  . docusate sodium  200 mg Oral Daily  . escitalopram  10 mg Oral Daily  . feeding supplement (ENSURE ENLIVE)  237 mL Oral BID BM  . ferrous fumarate-b12-vitamic C-folic acid  1 capsule Oral BID PC  . furosemide  20 mg Oral Daily  . metoprolol tartrate  6.25 mg Oral BID  . pantoprazole  40 mg Oral Daily  . pneumococcal 23 valent vaccine  0.5 mL Intramuscular Tomorrow-1000  . potassium chloride  20 mEq Oral TID  . QUEtiapine  25 mg Oral QHS  . sodium chloride flush  3 mL Intravenous Q12H   Continuous Infusions: . sodium chloride    . sodium chloride    . cefTRIAXone (ROCEPHIN)  IV 2 g (06/03/19 1031)  . lactated ringers Stopped (04/30/19 2141)  . vancomycin 1,250 mg (06/03/19 1134)   PRN Meds:.sodium  chloride, acetaminophen, diphenhydrAMINE, ondansetron (ZOFRAN) IV, polyethylene glycol, sodium chloride flush  Antimicrobials: Anti-infectives (From admission, onward)   Start     Dose/Rate Route Frequency Ordered Stop   05/22/19 2100  vancomycin (VANCOREADY) IVPB 1250 mg/250 mL     1,250 mg 166.7 mL/hr over 90 Minutes Intravenous Every 12 hours 05/22/19 0937     05/19/19 2100  vancomycin (VANCOCIN) IVPB 1000 mg/200 mL premix  Status:  Discontinued     1,000 mg 200 mL/hr over 60 Minutes Intravenous Every 12 hours 05/19/19 1040 05/22/19 0937   05/03/19 1000  fluconazole (DIFLUCAN) tablet 150 mg     150 mg Oral  Once 05/03/19 0804 05/03/19 1243   04/28/19 0400  vancomycin (VANCOREADY) IVPB 1250 mg/250 mL  Status:  Discontinued     1,250 mg 166.7 mL/hr over 90 Minutes Intravenous To Surgery 04/27/19 1131 04/28/19 1451   04/28/19 0400  cefUROXime (ZINACEF) 1.5 g in sodium chloride 0.9 %  100 mL IVPB     1.5 g 200 mL/hr over 30 Minutes Intravenous To Surgery 04/27/19 1131 04/28/19 0819   04/28/19 0400  cefUROXime (ZINACEF) 750 mg in sodium chloride 0.9 % 100 mL IVPB     750 mg 200 mL/hr over 30 Minutes Intravenous To Surgery 04/27/19 1131 04/28/19 1313   04/15/19 0900  vancomycin (VANCOREADY) IVPB 750 mg/150 mL  Status:  Discontinued     750 mg 150 mL/hr over 60 Minutes Intravenous Every 8 hours 04/15/19 0821 05/19/19 1040   04/13/19 1400  vancomycin (VANCOCIN) IVPB 1000 mg/200 mL premix  Status:  Discontinued     1,000 mg 200 mL/hr over 60 Minutes Intravenous Every 8 hours 04/13/19 0839 04/15/19 0821   04/12/19 2200  vancomycin (VANCOREADY) IVPB 750 mg/150 mL  Status:  Discontinued     750 mg 150 mL/hr over 60 Minutes Intravenous Every 8 hours 04/12/19 1500 04/13/19 0839   04/10/19 1336  vancomycin (VANCOCIN) IVPB 1000 mg/200 mL premix  Status:  Discontinued     1,000 mg 200 mL/hr over 60 Minutes Intravenous Every 8 hours 04/10/19 1320 04/12/19 1500   04/08/19 1200  cefTRIAXone  (ROCEPHIN) 2 g in sodium chloride 0.9 % 100 mL IVPB     2 g 200 mL/hr over 30 Minutes Intravenous Every 12 hours 04/08/19 1013 06/09/19 2359   04/08/19 0530  vancomycin (VANCOCIN) IVPB 1000 mg/200 mL premix  Status:  Discontinued     1,000 mg 200 mL/hr over 60 Minutes Intravenous Every 12 hours 04/07/19 1541 04/10/19 1320   04/07/19 1900  Ampicillin-Sulbactam (UNASYN) 3 g in sodium chloride 0.9 % 100 mL IVPB  Status:  Discontinued     3 g 200 mL/hr over 30 Minutes Intravenous Every 6 hours 04/07/19 1457 04/08/19 1013   04/07/19 1730  vancomycin (VANCOREADY) IVPB 1500 mg/300 mL     1,500 mg 150 mL/hr over 120 Minutes Intravenous  Once 04/07/19 1541 04/07/19 1937   04/04/19 1400  ceFAZolin (ANCEF) IVPB 2g/100 mL premix  Status:  Discontinued     2 g 200 mL/hr over 30 Minutes Intravenous Every 8 hours 04/04/19 1119 04/07/19 1457   04/04/19 0400  Vancomycin (VANCOCIN) 1,250 mg in sodium chloride 0.9 % 250 mL IVPB  Status:  Discontinued     1,250 mg 166.7 mL/hr over 90 Minutes Intravenous Every 12 hours 04/03/19 1635 04/04/19 1159   04/02/19 1800  ceFEPIme (MAXIPIME) 2 g in sodium chloride 0.9 % 100 mL IVPB  Status:  Discontinued     2 g 200 mL/hr over 30 Minutes Intravenous Every 8 hours 04/02/19 1218 04/04/19 1127   04/02/19 1600  vancomycin (VANCOCIN) IVPB 1000 mg/200 mL premix  Status:  Discontinued     1,000 mg 200 mL/hr over 60 Minutes Intravenous Every 12 hours 04/02/19 0100 04/03/19 1635   04/02/19 0200  piperacillin-tazobactam (ZOSYN) IVPB 3.375 g  Status:  Discontinued     3.375 g 12.5 mL/hr over 240 Minutes Intravenous Every 8 hours 04/02/19 0059 04/02/19 1219   04/02/19 0200  vancomycin (VANCOCIN) 1,500 mg in sodium chloride 0.9 % 500 mL IVPB     1,500 mg 250 mL/hr over 120 Minutes Intravenous  Once 04/02/19 0100 04/02/19 0923   04/01/19 2040  ceFAZolin (ANCEF) 2-4 GM/100ML-% IVPB    Note to Pharmacy: [name]   : cabinet override      04/01/19 2040 04/02/19 0844        I have personally reviewed the following labs and images:  CBC: Recent Labs  Lab 05/28/19 0500  WBC 4.5  HGB 9.6*  HCT 31.6*  MCV 88.0  PLT 325   BMP &GFR Recent Labs  Lab 05/28/19 0500 06/03/19 0643  NA 136 137  K 4.6 4.3  CL 106 106  CO2 23 23  GLUCOSE 83 91  BUN 22* 21*  CREATININE 0.60 0.64  CALCIUM 8.5* 8.6*   Estimated Creatinine Clearance: 111 mL/min (by C-G formula based on SCr of 0.64 mg/dL). Liver & Pancreas: Recent Labs  Lab 05/28/19 0500  AST 21  ALT 19  ALKPHOS 73  BILITOT 0.3  PROT 6.1*  ALBUMIN 2.8*   No results for input(s): LIPASE, AMYLASE in the last 168 hours. No results for input(s): AMMONIA in the last 168 hours. Diabetic: No results for input(s): HGBA1C in the last 72 hours. No results for input(s): GLUCAP in the last 168 hours. Cardiac Enzymes: No results for input(s): CKTOTAL, CKMB, CKMBINDEX, TROPONINI in the last 168 hours. No results for input(s): PROBNP in the last 8760 hours. Coagulation Profile: No results for input(s): INR, PROTIME in the last 168 hours. Thyroid Function Tests: No results for input(s): TSH, T4TOTAL, FREET4, T3FREE, THYROIDAB in the last 72 hours. Lipid Profile: No results for input(s): CHOL, HDL, LDLCALC, TRIG, CHOLHDL, LDLDIRECT in the last 72 hours. Anemia Panel: No results for input(s): VITAMINB12, FOLATE, FERRITIN, TIBC, IRON, RETICCTPCT in the last 72 hours. Urine analysis: No results found for: COLORURINE, APPEARANCEUR, LABSPEC, PHURINE, GLUCOSEU, HGBUR, BILIRUBINUR, KETONESUR, PROTEINUR, UROBILINOGEN, NITRITE, LEUKOCYTESUR Sepsis Labs: Invalid input(s): PROCALCITONIN, LACTICIDVEN  Microbiology: No results found for this or any previous visit (from the past 240 hour(s)).  Radiology Studies: No results found.    Aloysious Vangieson T. Zitlaly Malson [facility]  If 7PM-7AM, please contact night-coverage [email or web address] Password [facility] 06/03/2019, 2:54 PM

## 2019-06-04 NOTE — Progress Notes (Signed)
Mobility Specialist - Progress Note   06/04/19 1551  Mobility  Activity Ambulated in hall  Level of Assistance Independent  Assistive Device None  Distance Ambulated (ft) 1600 ft  Mobility Response Tolerated well  Mobility performed by Mobility specialist  $Mobility charge 1 Mobility   Pt was seen returning from walking independently and told me that she felt fine while walking and walked down to the breezeway leading to the [employer] as she normally does.   Mamie Levers Mobility Specialist

## 2019-06-04 NOTE — Progress Notes (Signed)
Pt boyfriend came to unit. Mother and sister have been only visitors this admission per designated visitors. Epic was down at time and patient insisted that [place] knew he could visit and MD's only did her surgery on condition of being allowed three visitors. Patient is to be discharged on Monday. Patient wanted sister removed from list and boyfriend added. Designated visitor not changed in computer.

## 2019-06-04 NOTE — Progress Notes (Addendum)
      [address]       [place] [address]             [phone]      37 Days Post-Op Procedure(s) (LRB): AORTIC VALVE REPLACEMENT (AVR) using Edwards PERIMOUNT Magna Ease Pericardial Bioprosthesis - 29 MM Aortic Valve. (N/A) REPAIR OF TRICUSPID (N/A) TRANSESOPHAGEAL ECHOCARDIOGRAM (TEE) (N/A) Subjective: Feels good this morning. Planning to walk this morning.   Objective: Vital signs in last 24 hours: Temp:  [97.7 F (36.5 C)-98 F (36.7 C)] 97.7 F (36.5 C) (05/27 0504) Pulse Rate:  [96-99] 97 (05/27 0504) Cardiac Rhythm: Sinus tachycardia (05/26 1900) Resp:  [17-18] 18 (05/27 0504) BP: (92-109)/(58-79) 92/58 (05/27 0504) SpO2:  [100 %] 100 % (05/27 0504) Weight:  [82.1 kg] 82.1 kg (05/27 0511)     Intake/Output from previous day: 05/26 0701 - 05/27 0700 In: 10 [I.V.:10] Out: -  Intake/Output this shift: No intake/output data recorded.  General appearance: alert, cooperative and no distress Heart: regular rate and rhythm, S1, S2 normal, no murmur, click, rub or gallop Lungs: clear to auscultation bilaterally Abdomen: soft, non-tender; bowel sounds normal; no masses,  no organomegaly Extremities: extremities normal, atraumatic, no cyanosis or edema Wound: clean and dry  Lab Results: No results for input(s): WBC, HGB, HCT, PLT in the last 72 hours. BMET:  Recent Labs    06/03/19 0643  NA 137  K 4.3  CL 106  CO2 23  GLUCOSE 91  BUN 21*  CREATININE 0.64  CALCIUM 8.6*    PT/INR: No results for input(s): LABPROT, INR in the last 72 hours. ABG    Component Value Date/Time   PHART 7.365 04/28/2019 2205   HCO3 24.5 04/28/2019 2205   TCO2 26 04/28/2019 2205   ACIDBASEDEF 1.0 04/28/2019 2205   O2SAT 97.0 04/28/2019 2205   CBG (last 3)  No results for input(s): GLUCAP in the last 72 hours.  Assessment/Plan: S/P Procedure(s) (LRB): AORTIC VALVE REPLACEMENT (AVR) using Edwards PERIMOUNT Magna Ease Pericardial Bioprosthesis - 29 MM  Aortic Valve. (N/A) REPAIR OF TRICUSPID (N/A) TRANSESOPHAGEAL ECHOCARDIOGRAM (TEE) (N/A)  -[id number] replacement (tissue valve) and TV repair for endocarditis.  IV ABX planned through 5/31. No change in mgt from CTS standpoint.Remains hemodynamically stable. WBC 4.5, no recent fevers Off Oxycodone, no pain  Plan: Continue daily ambulation. Continue IV antibiotics. Will be ready for d/c next week so will need follow-up appointment from all the other services.    LOS: 64 days    [name] 06/04/2019  Stable day I have seen and examined Dossie Wanjiku Geibel and agree with the above assessment  and plan.  Delight Ovens MD Beeper [phone] Office [phone] 06/04/2019 8:27 AM

## 2019-06-04 NOTE — Progress Notes (Signed)
PROGRESS NOTE  Lauren Reyes WUJ:[id number] DOB: [birth year]   PCP: Patient, No Pcp Per  Patient is from: Home  DOA: 04/01/2019 LOS: 64  Brief Narrative / Interim history: 29 year old female with past medical history of substance abuse with heroin, crystal meth and cocaine who presented to the ER with aphasia and right-sided weakness and had a code stroke subsequently called. She was admitted to the neurology ICU on 04/01/2019. She was intubated and underwent thrombectomy for left MCA M3 occlusion and recanalization. This was unfortunately complicated by dissection and pseudoaneurysm of the left ICA, s/p stent placement. She was extubated on 04/04/2019. She was found to have a right common femoral vein DVT along with right groin hematoma which was treated with IVC filter placement. Patient had a TEE which showed infective endocarditis and CT surgery was consulted. She is status post bioprosthetic aortic valve replacement and tricuspid valve repair on 04/28/2019 and is on IV antibiotics. CT surgery on board, requested TRH takeover service on 5/6. Plan to complete 6 weeks IV antibiotics while inpatient, last dose 5/31  Subjective: Seen and examined earlier this morning.  No major events overnight of this morning.  No complaints.  Reports ambulating the hallway without problem.  Objective: Vitals:   06/03/19 2010 06/04/19 0504 06/04/19 0511 06/04/19 0815  BP: 109/79 (!) 92/58  100/61  Pulse: 96 97  79  Resp: 18 18  19   Temp: 98 F (36.7 C) 97.7 F (36.5 C)  97.7 F (36.5 C)  TempSrc: Oral Oral  Oral  SpO2: 100% 100%  99%  Weight:   82.1 kg   Height:        Intake/Output Summary (Last 24 hours) at 06/04/2019 1404 Last data filed at 06/04/2019 0939 Gross per 24 hour  Intake 10 ml  Output --  Net 10 ml   Filed Weights   06/02/19 0603 06/03/19 0500 06/04/19 0511  Weight: 81.8 kg 82.1 kg 82.1 kg    Examination:  GENERAL: No apparent distress.  Nontoxic. HEENT: MMM.   Vision and hearing grossly intact.  NECK: Supple.  No apparent JVD.  RESP:  No IWOB.  Fair aeration bilaterally. CVS:  RRR. Heart sounds normal.  ABD/GI/GU: BS+. Abd soft, NTND.  MSK/EXT:  Moves extremities. No apparent deformity. No edema.  SKIN: no apparent skin lesion or wound NEURO: Awake, alert and oriented appropriately.  No apparent focal neuro deficit. PSYCH: Calm. Normal affect.  Procedures:  3/24-left MCA thrombectomy by IR 4/20-AV replacement and TV repair by CTS  Microbiology summarized: 3/24-COVID-19 and influenza A/B PCR negative. 3/25 and 4/20-MRSA PCR negative. 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 -s/p AV replacement and TV repair on 4/26 by Dr. 5/26 -On IV Rocephin and vancomycin for 6 weeks through 06/08/2019 due to history of IVDU -Continue p.o. Lasix 20 mg daily -Outpatient follow-up with ID on 06/22/2019.  Acute left MCA embolic CVA s/p thrombectomy with revascularization on 3/24 by IR Left ICA dissection/pseudoaneurysm s/p stent placement-thought to be complication -On aspirin and Plavix for a total of 3 to 6 months -Outpatient follow-up with Dr. 4/24  -Outpatient follow-up with neurology-we will order on discharge.  Right common femoral DVT with right groin hematoma s/p IVC filter placement 04/03/2019 -Not a candidate for [facility] given septic emboli, dissection and hematoma  as well as dual antiplatelets -IR to arrange outpatient follow-up in 1 to 2 months after discharge for IVC filter   ABLA/IDA/B12 and folate deficiency, stable -Continue supplementations  Polysubstance abuse: THC, amphetamine and suspected IVDU -No withdrawal symptoms -Continue  counseling  Anxiety: Stable  -Continue BuSpar, Lexapro and Seroquel  Hypotension: Mild.  Asymptomatic. -Continue monitoring   Nutrition Problem: Increased nutrient needs Etiology: acute illness Signs/Symptoms: estimated needs Interventions: Ensure Enlive (each supplement provides 350kcal and 20 grams of protein)   DVT prophylaxis: Start subcu Lovenox Code Status: Full code Family Communication: Patient and/or RN. Available if any question.  Status is: Inpatient  Remains inpatient appropriate because:IV treatments appropriate due to intensity of illness or inability to take PO   Dispo: The patient is from: Home              Anticipated d/c is to: Home              Anticipated d/c date is: > 3 days              Patient currently is not medically stable to d/c.        Consultants:  Cardiothoracic surgery IR Infectious disease Cardiology   Sch Meds:  Scheduled Meds: . aspirin EC  81 mg Oral Daily   Or  . aspirin  81 mg Per Tube Daily  . bisacodyl  10 mg Oral Daily   Or  . bisacodyl  10 mg Rectal Daily  . busPIRone  7.5 mg Oral BID  . Chlorhexidine Gluconate Cloth  6 each Topical Daily  . clopidogrel  75 mg Oral Daily  . [facility] Patient [facility] Family Education   Does not apply Once  . docusate sodium  200 mg Oral Daily  . escitalopram  10 mg Oral Daily  . feeding supplement (ENSURE ENLIVE)  237 mL Oral BID BM  . ferrous fumarate-b12-vitamic C-folic acid  1 capsule Oral BID PC  . furosemide  20 mg Oral Daily  . metoprolol tartrate  6.25 mg Oral BID  . pantoprazole  40 mg Oral Daily  . pneumococcal 23 valent vaccine  0.5 mL Intramuscular Tomorrow-1000  . potassium chloride  20 mEq Oral TID  . QUEtiapine  25 mg Oral QHS  . sodium chloride flush  3 mL Intravenous Q12H   Continuous Infusions: . sodium chloride    . sodium chloride    . cefTRIAXone (ROCEPHIN)  IV 2 g (06/04/19 1008)  . lactated ringers Stopped (04/30/19 2141)  . vancomycin  1,250 mg (06/04/19 1119)   PRN Meds:.sodium chloride, acetaminophen, diphenhydrAMINE, ondansetron (ZOFRAN) IV, polyethylene glycol, sodium chloride flush  Antimicrobials: Anti-infectives (From admission, onward)   Start     Dose/Rate Route Frequency Ordered Stop   05/22/19 2100  vancomycin (VANCOREADY) IVPB 1250 mg/250 mL     1,250 mg 166.7 mL/hr over 90 Minutes Intravenous Every 12 hours 05/22/19 0937     05/19/19 2100  vancomycin (VANCOCIN) IVPB 1000 mg/200 mL premix  Status:  Discontinued     1,000 mg 200 mL/hr over 60 Minutes Intravenous Every 12 hours 05/19/19 1040 05/22/19 0937   05/03/19 1000  fluconazole (DIFLUCAN) tablet 150 mg     150 mg Oral  Once 05/03/19 0804 05/03/19 1243   04/28/19 0400  vancomycin (VANCOREADY) IVPB 1250 mg/250 mL  Status:  Discontinued     1,250 mg 166.7 mL/hr over 90 Minutes Intravenous To Surgery 04/27/19 1131 04/28/19 1451   04/28/19 0400  cefUROXime (ZINACEF)  1.5 g in sodium chloride 0.9 % 100 mL IVPB     1.5 g 200 mL/hr over 30 Minutes Intravenous To Surgery 04/27/19 1131 04/28/19 0819   04/28/19 0400  cefUROXime (ZINACEF) 750 mg in sodium chloride 0.9 % 100 mL IVPB     750 mg 200 mL/hr over 30 Minutes Intravenous To Surgery 04/27/19 1131 04/28/19 1313   04/15/19 0900  vancomycin (VANCOREADY) IVPB 750 mg/150 mL  Status:  Discontinued     750 mg 150 mL/hr over 60 Minutes Intravenous Every 8 hours 04/15/19 0821 05/19/19 1040   04/13/19 1400  vancomycin (VANCOCIN) IVPB 1000 mg/200 mL premix  Status:  Discontinued     1,000 mg 200 mL/hr over 60 Minutes Intravenous Every 8 hours 04/13/19 0839 04/15/19 0821   04/12/19 2200  vancomycin (VANCOREADY) IVPB 750 mg/150 mL  Status:  Discontinued     750 mg 150 mL/hr over 60 Minutes Intravenous Every 8 hours 04/12/19 1500 04/13/19 0839   04/10/19 1336  vancomycin (VANCOCIN) IVPB 1000 mg/200 mL premix  Status:  Discontinued     1,000 mg 200 mL/hr over 60 Minutes Intravenous Every 8 hours 04/10/19 1320  04/12/19 1500   04/08/19 1200  cefTRIAXone (ROCEPHIN) 2 g in sodium chloride 0.9 % 100 mL IVPB     2 g 200 mL/hr over 30 Minutes Intravenous Every 12 hours 04/08/19 1013 06/09/19 2359   04/08/19 0530  vancomycin (VANCOCIN) IVPB 1000 mg/200 mL premix  Status:  Discontinued     1,000 mg 200 mL/hr over 60 Minutes Intravenous Every 12 hours 04/07/19 1541 04/10/19 1320   04/07/19 1900  Ampicillin-Sulbactam (UNASYN) 3 g in sodium chloride 0.9 % 100 mL IVPB  Status:  Discontinued     3 g 200 mL/hr over 30 Minutes Intravenous Every 6 hours 04/07/19 1457 04/08/19 1013   04/07/19 1730  vancomycin (VANCOREADY) IVPB 1500 mg/300 mL     1,500 mg 150 mL/hr over 120 Minutes Intravenous  Once 04/07/19 1541 04/07/19 1937   04/04/19 1400  ceFAZolin (ANCEF) IVPB 2g/100 mL premix  Status:  Discontinued     2 g 200 mL/hr over 30 Minutes Intravenous Every 8 hours 04/04/19 1119 04/07/19 1457   04/04/19 0400  Vancomycin (VANCOCIN) 1,250 mg in sodium chloride 0.9 % 250 mL IVPB  Status:  Discontinued     1,250 mg 166.7 mL/hr over 90 Minutes Intravenous Every 12 hours 04/03/19 1635 04/04/19 1159   04/02/19 1800  ceFEPIme (MAXIPIME) 2 g in sodium chloride 0.9 % 100 mL IVPB  Status:  Discontinued     2 g 200 mL/hr over 30 Minutes Intravenous Every 8 hours 04/02/19 1218 04/04/19 1127   04/02/19 1600  vancomycin (VANCOCIN) IVPB 1000 mg/200 mL premix  Status:  Discontinued     1,000 mg 200 mL/hr over 60 Minutes Intravenous Every 12 hours 04/02/19 0100 04/03/19 1635   04/02/19 0200  piperacillin-tazobactam (ZOSYN) IVPB 3.375 g  Status:  Discontinued     3.375 g 12.5 mL/hr over 240 Minutes Intravenous Every 8 hours 04/02/19 0059 04/02/19 1219   04/02/19 0200  vancomycin (VANCOCIN) 1,500 mg in sodium chloride 0.9 % 500 mL IVPB     1,500 mg 250 mL/hr over 120 Minutes Intravenous  Once 04/02/19 0100 04/02/19 0923   04/01/19 2040  ceFAZolin (ANCEF) 2-4 GM/100ML-% IVPB    Note to Pharmacy: [name]   : cabinet override       04/01/19 2040 04/02/19 0844       I have  personally reviewed the following labs and images: CBC: No results for input(s): WBC, NEUTROABS, HGB, HCT, MCV, PLT in the last 168 hours. BMP &GFR Recent Labs  Lab 06/03/19 0643  NA 137  K 4.3  CL 106  CO2 23  GLUCOSE 91  BUN 21*  CREATININE 0.64  CALCIUM 8.6*   Estimated Creatinine Clearance: 111 mL/min (by C-G formula based on SCr of 0.64 mg/dL). Liver & Pancreas: No results for input(s): AST, ALT, ALKPHOS, BILITOT, PROT, ALBUMIN in the last 168 hours. No results for input(s): LIPASE, AMYLASE in the last 168 hours. No results for input(s): AMMONIA in the last 168 hours. Diabetic: No results for input(s): HGBA1C in the last 72 hours. No results for input(s): GLUCAP in the last 168 hours. Cardiac Enzymes: No results for input(s): CKTOTAL, CKMB, CKMBINDEX, TROPONINI in the last 168 hours. No results for input(s): PROBNP in the last 8760 hours. Coagulation Profile: No results for input(s): INR, PROTIME in the last 168 hours. Thyroid Function Tests: No results for input(s): TSH, T4TOTAL, FREET4, T3FREE, THYROIDAB in the last 72 hours. Lipid Profile: No results for input(s): CHOL, HDL, LDLCALC, TRIG, CHOLHDL, LDLDIRECT in the last 72 hours. Anemia Panel: No results for input(s): VITAMINB12, FOLATE, FERRITIN, TIBC, IRON, RETICCTPCT in the last 72 hours. Urine analysis: No results found for: COLORURINE, APPEARANCEUR, LABSPEC, PHURINE, GLUCOSEU, HGBUR, BILIRUBINUR, KETONESUR, PROTEINUR, UROBILINOGEN, NITRITE, LEUKOCYTESUR Sepsis Labs: Invalid input(s): PROCALCITONIN, LACTICIDVEN  Microbiology: No results found for this or any previous visit (from the past 240 hour(s)).  Radiology Studies: No results found.    Lauren Reyes [facility]  If 7PM-7AM, please contact night-coverage [email or web address] Password [facility] 06/04/2019, 2:04 PM

## 2019-06-04 NOTE — Progress Notes (Signed)
CARDIAC REHAB PHASE    Walking independently   MODE:  Ambulation: 1600+ ft   POST:  Rate/Rhythm: 95 SR  BP:  Supine:   Sitting: 100/61  Standing:    SaO2: 99%RA [id number] Pt just back from walking independently. No difficulty. Vitals stable. Gave pt low sodium diets and need to adhere to 2000 mg restriction. Pt in good spirits and very motivated to walk.   Luetta Nutting, RN BSN  06/04/2019 8:17 AM

## 2019-06-05 NOTE — Progress Notes (Signed)
CARDIAC REHAB PHASE I   PRE:  Rate/Rhythm: 91 SR  BP:  Supine:   Sitting: 89/74  Standing:    SaO2: 100%RA  MODE:  Ambulation: 1800 ft   POST:  Rate/Rhythm: 102 ST  BP:  Supine:   Sitting: 93/75  Standing:    SaO2: 100%RA 1320-1405 Pt walked 1800 ft on RA with steady gait and tolerated well. Encouraged pt to continue walking at home after discharge. Adhering to sternal precautions. Has been reading low sodium diets. Discussed CRP 2. Pt stated she would like to consider program. Waiting to see if she gets Medicaid. Pt stated she is going to get new smart phone after discharge so she might be interested in Virtual if she does not get Medicaid coverage for program. Referred to [place] for follow up. Pt has been very motivated here and consistently gets 3 or 4 walks a day.    Luetta Nutting, RN BSN  06/05/2019 2:03 PM

## 2019-06-05 NOTE — Progress Notes (Addendum)
      [address]       [place] [address]             [phone]      38 Days Post-Op Procedure(s) (LRB): AORTIC VALVE REPLACEMENT (AVR) using Edwards PERIMOUNT Magna Ease Pericardial Bioprosthesis - 29 MM Aortic Valve. (N/A) REPAIR OF TRICUSPID (N/A) TRANSESOPHAGEAL ECHOCARDIOGRAM (TEE) (N/A) Subjective: Feels good, still waking up this morning.  Objective: Vital signs in last 24 hours: Temp:  [97.7 F (36.5 C)-98.3 F (36.8 C)] 98.3 F (36.8 C) (05/28 0617) Pulse Rate:  [79-99] 95 (05/28 0617) Cardiac Rhythm: Normal sinus rhythm (05/27 1900) Resp:  [16-19] 18 (05/28 0617) BP: (88-113)/(54-82) 88/54 (05/28 0617) SpO2:  [97 %-100 %] 97 % (05/28 0109) Weight:  [82.7 kg] 82.7 kg (05/28 0622)     Intake/Output from previous day: 05/27 0701 - 05/28 0700 In: 1503.2 [I.V.:10; IV Piggyback:1493.2] Out: -  Intake/Output this shift: No intake/output data recorded.  General appearance: alert, cooperative and no distress Heart: regular rate and rhythm, S1, S2 normal, no murmur, click, rub or gallop Lungs: clear to auscultation bilaterally Abdomen: soft, non-tender; bowel sounds normal; no masses,  no organomegaly Extremities: extremities normal, atraumatic, no cyanosis or edema Wound: clean and dry  Lab Results: No results for input(s): WBC, HGB, HCT, PLT in the last 72 hours. BMET:  Recent Labs    06/03/19 0643  NA 137  K 4.3  CL 106  CO2 23  GLUCOSE 91  BUN 21*  CREATININE 0.64  CALCIUM 8.6*    PT/INR: No results for input(s): LABPROT, INR in the last 72 hours. ABG    Component Value Date/Time   PHART 7.365 04/28/2019 2205   HCO3 24.5 04/28/2019 2205   TCO2 26 04/28/2019 2205   ACIDBASEDEF 1.0 04/28/2019 2205   O2SAT 97.0 04/28/2019 2205   CBG (last 3)  No results for input(s): GLUCAP in the last 72 hours.  Assessment/Plan: S/P Procedure(s) (LRB): AORTIC VALVE REPLACEMENT (AVR) using Edwards PERIMOUNT Magna Ease Pericardial  Bioprosthesis - 29 MM Aortic Valve. (N/A) REPAIR OF TRICUSPID (N/A) TRANSESOPHAGEAL ECHOCARDIOGRAM (TEE) (N/A)  -[id number] replacement (tissue valve) and TV repair for endocarditis.  IV ABX planned through 5/31. No change in mgt from CTS standpoint.Remains hemodynamically stable. WBC 4.5, no recent fevers Off Oxycodone, no pain BP low at times but asymptomatic  Plan: IVC filter to be removed outpatient. Continue IV antibiotics. No issues. Continue ambulation and use of incentive spirometer.    LOS: 65 days    [name] 06/05/2019 I have seen and examined Sheniece Wanjiku Keo and agree with the above assessment  and plan.  Delight Ovens MD Beeper [phone] Office [phone] 06/05/2019 8:52 AM

## 2019-06-05 NOTE — Progress Notes (Signed)
Mobility Specialist: Progress Note    06/05/19 1559  Mobility  Activity Ambulated in hall  Level of Assistance Independent  Assistive Device None  Mobility performed by Other (comment) (Seen ambulating with cardiac rehab)  $Mobility charge 1 Mobility   Pt was seen walking with cardiac rehab and tolerating ambulation well. Pt is known to walk on her own independently.   [facility] Barbra Miner Mobility Specialist

## 2019-06-05 NOTE — Progress Notes (Signed)
PROGRESS NOTE  Lauren Reyes YPP:[id number] DOB: 1990-06-25   PCP: Patient, No Pcp Per  Patient is from: Home  DOA: 04/01/2019 LOS: 65  Brief Narrative / Interim history: 29 year old female with past medical history of substance abuse with heroin, crystal meth and cocaine who presented to the ER with aphasia and right-sided weakness and had a code stroke subsequently called. She was admitted to the neurology ICU on 04/01/2019. She was intubated and underwent thrombectomy for left MCA M3 occlusion and recanalization. This was unfortunately complicated by dissection and pseudoaneurysm of the left ICA, s/p stent placement. She was extubated on 04/04/2019. She was found to have a right common femoral vein DVT along with right groin hematoma which was treated with IVC filter placement. Patient had a TEE which showed infective endocarditis and CT surgery was consulted. She is status post bioprosthetic aortic valve replacement and tricuspid valve repair on 04/28/2019 and is on IV antibiotics. CT surgery on board, requested TRH takeover service on 5/6. Plan to complete 6 weeks IV antibiotics while inpatient, last dose 5/31.  CTS, IR, Dr. Corliss Skains, ID and neurology outpatient.   Subjective: Seen and examined earlier this morning.  No major events overnight of this morning.  No complaints.  Objective: Vitals:   06/05/19 0109 06/05/19 0617 06/05/19 0622 06/05/19 0848  BP: (!) 88/60 (!) 88/54  96/70  Pulse: 88 95  76  Resp: 18 18  16   Temp: 98 F (36.7 C) 98.3 F (36.8 C)  98.6 F (37 C)  TempSrc: Oral Oral  Oral  SpO2: 97%   100%  Weight:   82.7 kg   Height:        Intake/Output Summary (Last 24 hours) at 06/05/2019 1157 Last data filed at 06/05/2019 0238 Gross per 24 hour  Intake 1493.15 ml  Output --  Net 1493.15 ml   Filed Weights   06/03/19 0500 06/04/19 0511 06/05/19 0622  Weight: 82.1 kg 82.1 kg 82.7 kg    Examination:  GENERAL: No apparent distress.   Nontoxic. HEENT: MMM.  Vision and hearing grossly intact.  NECK: Supple.  No apparent JVD.  RESP:  No IWOB.  Fair aeration bilaterally. CVS:  RRR. Heart sounds normal.  ABD/GI/GU: BS+. Abd soft, NTND.  MSK/EXT:  Moves extremities. No apparent deformity. No edema.  SKIN: no apparent skin lesion or wound NEURO: Awake, alert and oriented appropriately.  No apparent focal neuro deficit. PSYCH: Calm. Normal affect.  Procedures:  3/24-left MCA thrombectomy by IR 4/20-AV replacement and TV repair by CTS  Microbiology summarized: 3/24-COVID-19 and influenza A/B PCR negative. 3/25 and 4/20-MRSA PCR negative. 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 -s/p AV replacement and TV repair on 4/26 by Dr. 5/26 -On IV Rocephin and vancomycin for 6 weeks through 06/08/2019 due to history of IVDU -Continue p.o. Lasix 20 mg daily -Outpatient follow-up with ID on 06/22/2019.  Acute left MCA embolic CVA s/p thrombectomy with revascularization on 3/24 by IR Left ICA dissection/pseudoaneurysm s/p stent placement-thought to be complication -On aspirin and Plavix for a total of 3 to 6 months -"Order placed to facilitate f/up with Dr. 4/24 in clinic 4 wks after discharge" per IR -Outpatient follow-up with neurology-we will order on discharge.  Right common femoral DVT with right groin hematoma s/p IVC  filter placement 04/03/2019 -Not a candidate for [facility] given septic emboli, dissection and hematoma as well as dual antiplatelets -IR to arrange outpatient follow-up in 1 to 2 months after discharge for IVC filter   ABLA/IDA/B12 and folate deficiency, stable -Continue supplementations  Polysubstance abuse:  THC, amphetamine and suspected IVDU -No withdrawal symptoms -Continue counseling  Anxiety: Stable  -Continue BuSpar, Lexapro and Seroquel  Hypotension: Mild.  Asymptomatic. -Continue monitoring   Nutrition Problem: Increased nutrient needs Etiology: acute illness Signs/Symptoms: estimated needs Interventions: Ensure Enlive (each supplement provides 350kcal and 20 grams of protein)   DVT prophylaxis: Start subcu Lovenox Code Status: Full code Family Communication: Patient and/or RN. Available if any question.  Status is: Inpatient  Remains inpatient appropriate because:IV treatments appropriate due to intensity of illness or inability to take PO   Dispo: The patient is from: Home              Anticipated d/c is to: Home              Anticipated d/c date is: > 3 days              Patient currently is not medically stable to d/c.        Consultants:  Cardiothoracic surgery IR Infectious disease Cardiology   Sch Meds:  Scheduled Meds: . aspirin EC  81 mg Oral Daily   Or  . aspirin  81 mg Per Tube Daily  . bisacodyl  10 mg Oral Daily   Or  . bisacodyl  10 mg Rectal Daily  . busPIRone  7.5 mg Oral BID  . Chlorhexidine Gluconate Cloth  6 each Topical Daily  . clopidogrel  75 mg Oral Daily  . [place] [facility], Patient & Family Education   Does not apply Once  . docusate sodium  200 mg Oral Daily  . escitalopram  10 mg Oral Daily  . feeding supplement (ENSURE ENLIVE)  237 mL Oral BID BM  . ferrous fumarate-b12-vitamic C-folic acid  1 capsule Oral BID PC  . furosemide  20 mg Oral Daily  . metoprolol tartrate  6.25 mg Oral BID  . pantoprazole  40 mg Oral Daily  . pneumococcal 23 valent vaccine  0.5 mL Intramuscular Tomorrow-1000  . potassium chloride  20 mEq Oral TID  . QUEtiapine  25 mg Oral QHS  . sodium chloride flush  3 mL Intravenous Q12H   Continuous Infusions: . sodium chloride    . sodium chloride    . cefTRIAXone (ROCEPHIN)  IV 2 g  (06/05/19 0945)  . lactated ringers Stopped (04/30/19 2141)  . vancomycin 1,250 mg (06/05/19 1131)   PRN Meds:.sodium chloride, acetaminophen, diphenhydrAMINE, ondansetron (ZOFRAN) IV, polyethylene glycol, sodium chloride flush  Antimicrobials: Anti-infectives (From admission, onward)   Start     Dose/Rate Route Frequency Ordered Stop   05/22/19 2100  vancomycin (VANCOREADY) IVPB 1250 mg/250 mL     1,250 mg 166.7 mL/hr over 90 Minutes Intravenous Every 12 hours 05/22/19 0937 06/09/19 1059   05/19/19 2100  vancomycin (VANCOCIN) IVPB 1000 mg/200 mL premix  Status:  Discontinued     1,000 mg 200 mL/hr over 60 Minutes Intravenous Every 12 hours 05/19/19 1040 05/22/19 0937   05/03/19 1000  fluconazole (DIFLUCAN) tablet 150 mg     150 mg Oral  Once 05/03/19 0804 05/03/19 1243   04/28/19 0400  vancomycin (VANCOREADY) IVPB 1250 mg/250 mL  Status:  Discontinued     1,250 mg 166.7 mL/hr over 90 Minutes  Intravenous To Surgery 04/27/19 1131 04/28/19 1451   04/28/19 0400  cefUROXime (ZINACEF) 1.5 g in sodium chloride 0.9 % 100 mL IVPB     1.5 g 200 mL/hr over 30 Minutes Intravenous To Surgery 04/27/19 1131 04/28/19 0819   04/28/19 0400  cefUROXime (ZINACEF) 750 mg in sodium chloride 0.9 % 100 mL IVPB     750 mg 200 mL/hr over 30 Minutes Intravenous To Surgery 04/27/19 1131 04/28/19 1313   04/15/19 0900  vancomycin (VANCOREADY) IVPB 750 mg/150 mL  Status:  Discontinued     750 mg 150 mL/hr over 60 Minutes Intravenous Every 8 hours 04/15/19 0821 05/19/19 1040   04/13/19 1400  vancomycin (VANCOCIN) IVPB 1000 mg/200 mL premix  Status:  Discontinued     1,000 mg 200 mL/hr over 60 Minutes Intravenous Every 8 hours 04/13/19 0839 04/15/19 0821   04/12/19 2200  vancomycin (VANCOREADY) IVPB 750 mg/150 mL  Status:  Discontinued     750 mg 150 mL/hr over 60 Minutes Intravenous Every 8 hours 04/12/19 1500 04/13/19 0839   04/10/19 1336  vancomycin (VANCOCIN) IVPB 1000 mg/200 mL premix  Status:  Discontinued      1,000 mg 200 mL/hr over 60 Minutes Intravenous Every 8 hours 04/10/19 1320 04/12/19 1500   04/08/19 1200  cefTRIAXone (ROCEPHIN) 2 g in sodium chloride 0.9 % 100 mL IVPB     2 g 200 mL/hr over 30 Minutes Intravenous Every 12 hours 04/08/19 1013 06/09/19 2359   04/08/19 0530  vancomycin (VANCOCIN) IVPB 1000 mg/200 mL premix  Status:  Discontinued     1,000 mg 200 mL/hr over 60 Minutes Intravenous Every 12 hours 04/07/19 1541 04/10/19 1320   04/07/19 1900  Ampicillin-Sulbactam (UNASYN) 3 g in sodium chloride 0.9 % 100 mL IVPB  Status:  Discontinued     3 g 200 mL/hr over 30 Minutes Intravenous Every 6 hours 04/07/19 1457 04/08/19 1013   04/07/19 1730  vancomycin (VANCOREADY) IVPB 1500 mg/300 mL     1,500 mg 150 mL/hr over 120 Minutes Intravenous  Once 04/07/19 1541 04/07/19 1937   04/04/19 1400  ceFAZolin (ANCEF) IVPB 2g/100 mL premix  Status:  Discontinued     2 g 200 mL/hr over 30 Minutes Intravenous Every 8 hours 04/04/19 1119 04/07/19 1457   04/04/19 0400  Vancomycin (VANCOCIN) 1,250 mg in sodium chloride 0.9 % 250 mL IVPB  Status:  Discontinued     1,250 mg 166.7 mL/hr over 90 Minutes Intravenous Every 12 hours 04/03/19 1635 04/04/19 1159   04/02/19 1800  ceFEPIme (MAXIPIME) 2 g in sodium chloride 0.9 % 100 mL IVPB  Status:  Discontinued     2 g 200 mL/hr over 30 Minutes Intravenous Every 8 hours 04/02/19 1218 04/04/19 1127   04/02/19 1600  vancomycin (VANCOCIN) IVPB 1000 mg/200 mL premix  Status:  Discontinued     1,000 mg 200 mL/hr over 60 Minutes Intravenous Every 12 hours 04/02/19 0100 04/03/19 1635   04/02/19 0200  piperacillin-tazobactam (ZOSYN) IVPB 3.375 g  Status:  Discontinued     3.375 g 12.5 mL/hr over 240 Minutes Intravenous Every 8 hours 04/02/19 0059 04/02/19 1219   04/02/19 0200  vancomycin (VANCOCIN) 1,500 mg in sodium chloride 0.9 % 500 mL IVPB     1,500 mg 250 mL/hr over 120 Minutes Intravenous  Once 04/02/19 0100 04/02/19 0923   04/01/19 2040  ceFAZolin  (ANCEF) 2-4 GM/100ML-% IVPB    Note to Pharmacy: [name]   : cabinet override  04/01/19 2040 04/02/19 0844       I have personally reviewed the following labs and images: CBC: No results for input(s): WBC, NEUTROABS, HGB, HCT, MCV, PLT in the last 168 hours. BMP &GFR Recent Labs  Lab 06/03/19 0643  NA 137  K 4.3  CL 106  CO2 23  GLUCOSE 91  BUN 21*  CREATININE 0.64  CALCIUM 8.6*   Estimated Creatinine Clearance: 111.5 mL/min (by C-G formula based on SCr of 0.64 mg/dL). Liver & Pancreas: No results for input(s): AST, ALT, ALKPHOS, BILITOT, PROT, ALBUMIN in the last 168 hours. No results for input(s): LIPASE, AMYLASE in the last 168 hours. No results for input(s): AMMONIA in the last 168 hours. Diabetic: No results for input(s): HGBA1C in the last 72 hours. No results for input(s): GLUCAP in the last 168 hours. Cardiac Enzymes: No results for input(s): CKTOTAL, CKMB, CKMBINDEX, TROPONINI in the last 168 hours. No results for input(s): PROBNP in the last 8760 hours. Coagulation Profile: No results for input(s): INR, PROTIME in the last 168 hours. Thyroid Function Tests: No results for input(s): TSH, T4TOTAL, FREET4, T3FREE, THYROIDAB in the last 72 hours. Lipid Profile: No results for input(s): CHOL, HDL, LDLCALC, TRIG, CHOLHDL, LDLDIRECT in the last 72 hours. Anemia Panel: No results for input(s): VITAMINB12, FOLATE, FERRITIN, TIBC, IRON, RETICCTPCT in the last 72 hours. Urine analysis: No results found for: COLORURINE, APPEARANCEUR, LABSPEC, PHURINE, GLUCOSEU, HGBUR, BILIRUBINUR, KETONESUR, PROTEINUR, UROBILINOGEN, NITRITE, LEUKOCYTESUR Sepsis Labs: Invalid input(s): PROCALCITONIN, LACTICIDVEN  Microbiology: No results found for this or any previous visit (from the past 240 hour(s)).  Radiology Studies: No results found.    Vrishank Moster T. Josee Speece [facility]  If 7PM-7AM, please contact night-coverage [email or web address] Password [facility] 06/05/2019, 11:56 AM

## 2019-06-05 NOTE — Plan of Care (Signed)
  Problem: Health Behavior/Discharge Planning: Goal: Ability to manage health-related needs will improve Outcome: Progressing   Problem: Clinical Measurements: Goal: Respiratory complications will improve Outcome: Progressing Goal: Cardiovascular complication will be avoided Outcome: Progressing   

## 2019-06-05 NOTE — Plan of Care (Signed)
  Problem: Activity: Goal: Risk for activity intolerance will decrease Outcome: Adequate for Discharge   Problem: Nutrition: Goal: Adequate nutrition will be maintained Outcome: Adequate for Discharge   Problem: Elimination: Goal: Will not experience complications related to bowel motility Outcome: Adequate for Discharge Goal: Will not experience complications related to urinary retention Outcome: Adequate for Discharge   

## 2019-06-06 NOTE — Progress Notes (Signed)
PROGRESS NOTE  Lauren Reyes KWI:[id number] DOB: 09-26-1990   PCP: Patient, No Pcp Per  Patient is from: Home  DOA: 04/01/2019 LOS: 66  Brief Narrative / Interim history: 29 year old female with past medical history of substance abuse with heroin, crystal meth and cocaine who presented to the ER with aphasia and right-sided weakness and had a code stroke subsequently called. She was admitted to the neurology ICU on 04/01/2019. She was intubated and underwent thrombectomy for left MCA M3 occlusion and recanalization. This was unfortunately complicated by dissection and pseudoaneurysm of the left ICA, s/p stent placement. She was extubated on 04/04/2019. She was found to have a right common femoral vein DVT along with right groin hematoma which was treated with IVC filter placement. Patient had a TEE which showed infective endocarditis and CT surgery was consulted. She is status post bioprosthetic aortic valve replacement and tricuspid valve repair on 04/28/2019 and is on IV antibiotics. CT surgery on board, requested TRH takeover service on 5/6. Plan to complete 6 weeks IV antibiotics while inpatient, last dose 5/31. CTS, IR, Dr. Corliss Skains and ID arranged outpatient follow-ups.  Will order ambulatory referral to neurology on discharge.   Subjective: Seen and examined earlier this morning.  No major events overnight of this morning.  No complaints.  Objective: Vitals:   06/05/19 1841 06/05/19 2034 06/06/19 0612 06/06/19 0913  BP: 105/75 98/70 (!) 113/96 96/65  Pulse: 98 100 (!) 103 94  Resp: 16 16 15 16   Temp: 98.1 F (36.7 C) (!) 97.5 F (36.4 C) 98.4 F (36.9 C) (!) 97.5 F (36.4 C)  TempSrc: Oral Oral Oral Oral  SpO2: 99% 100% 100% 100%  Weight:   83.3 kg   Height:        Intake/Output Summary (Last 24 hours) at 06/06/2019 1135 Last data filed at 06/05/2019 1330 Gross per 24 hour  Intake 240 ml  Output --  Net 240 ml   Filed Weights   06/04/19 0511 06/05/19 0622  06/06/19 0612  Weight: 82.1 kg 82.7 kg 83.3 kg    Examination:  GENERAL: No apparent distress.  Nontoxic. HEENT: MMM.  Vision and hearing grossly intact.  NECK: Supple.  No apparent JVD.  RESP:  No IWOB.  Fair aeration bilaterally. CVS:  RRR. Heart sounds normal.  ABD/GI/GU: BS+. Abd soft, NTND.  MSK/EXT:  Moves extremities. No apparent deformity. No edema.  SKIN: Sternotomy wound clean and dry. NEURO: Awake, alert and oriented appropriately.  No apparent focal neuro deficit. PSYCH: Calm. Normal affect.  Procedures:  3/24-left MCA thrombectomy by IR 4/20-AV replacement and TV repair by CTS  Microbiology summarized: 3/24-COVID-19 and influenza A/B PCR negative. 3/25 and 4/20-MRSA PCR negative. 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 -s/p AV replacement and TV repair on 4/26 by Dr. 5/26 -On IV Rocephin and vancomycin for 6 weeks through 06/08/2019 due to history of IVDU -Continue p.o. Lasix 20 mg daily -Outpatient follow-up with ID on 06/22/2019. -Outpatient follow-up with CTS on 07/02/2019.  Acute left MCA embolic CVA s/p thrombectomy with revascularization on 3/24 by IR Left ICA dissection/pseudoaneurysm s/p stent placement-thought to be complication -On aspirin and Plavix for a total of 3 to 6 months -"Order placed to facilitate f/up with Dr. 4/24 in clinic 4 wks after discharge" per IR -  Outpatient follow-up with neurology-we will order on discharge.  Right common femoral DVT with right groin hematoma s/p IVC filter placement 04/03/2019 -Not a candidate for [facility] given septic emboli, dissection and hematoma as well as dual antiplatelets -IR to arrange outpatient follow-up in  1 to 2 months after discharge for IVC filter   ABLA/IDA/B12 and folate deficiency, stable -Continue supplementations  Polysubstance abuse: THC, amphetamine and suspected IVDU -No withdrawal symptoms -Continue counseling  Anxiety: Stable  -Continue BuSpar, Lexapro and Seroquel  Hypotension: Mild.  Asymptomatic. -Continue monitoring   Nutrition Problem: Increased nutrient needs Etiology: acute illness Signs/Symptoms: estimated needs Interventions: Ensure Enlive (each supplement provides 350kcal and 20 grams of protein)   DVT prophylaxis: Start subcu Lovenox Code Status: Full code Family Communication: Patient and/or RN. Available if any question.  Status is: Inpatient  Remains inpatient appropriate because:IV treatments appropriate due to intensity of illness or inability to take PO   Dispo: The patient is from: Home              Anticipated d/c is to: Home              Anticipated d/c date is: 3 days (on 06/09/2019)              Patient currently is not medically stable to d/c.        Consultants:  Cardiothoracic surgery IR Infectious disease Cardiology   Sch Meds:  Scheduled Meds: . aspirin EC  81 mg Oral Daily   Or  . aspirin  81 mg Per Tube Daily  . bisacodyl  10 mg Oral Daily   Or  . bisacodyl  10 mg Rectal Daily  . busPIRone  7.5 mg Oral BID  . Chlorhexidine Gluconate Cloth  6 each Topical Daily  . clopidogrel  75 mg Oral Daily  . [place] [facility], Patient & Family Education   Does not apply Once  . docusate sodium  200 mg Oral Daily  . escitalopram  10 mg Oral Daily  . feeding supplement (ENSURE ENLIVE)  237 mL Oral BID BM  . ferrous [id number] C-folic acid  1 capsule Oral BID PC  . furosemide  20 mg Oral Daily  . metoprolol tartrate  6.25 mg Oral BID  . pantoprazole  40 mg Oral Daily  . pneumococcal 23 valent vaccine  0.5 mL Intramuscular Tomorrow-1000  . potassium chloride  20 mEq Oral TID  . QUEtiapine  25 mg Oral QHS    . sodium chloride flush  3 mL Intravenous Q12H   Continuous Infusions: . sodium chloride    . sodium chloride    . cefTRIAXone (ROCEPHIN)  IV 2 g (06/06/19 0923)  . lactated ringers Stopped (04/30/19 2141)  . vancomycin 1,250 mg (06/06/19 1010)   PRN Meds:.sodium chloride, acetaminophen, diphenhydrAMINE, ondansetron (ZOFRAN) IV, polyethylene glycol, sodium chloride flush  Antimicrobials: Anti-infectives (From admission, onward)   Start     Dose/Rate Route Frequency Ordered Stop   05/22/19 2100  vancomycin (VANCOREADY) IVPB 1250 mg/250 mL     1,250 mg 166.7 mL/hr over 90 Minutes Intravenous Every 12 hours 05/22/19 0937 06/09/19 1059   05/19/19 2100  vancomycin (VANCOCIN) IVPB 1000 mg/200 mL premix  Status:  Discontinued     1,000 mg 200 mL/hr over 60 Minutes Intravenous Every 12 hours 05/19/19 1040 05/22/19 0937   05/03/19 1000  fluconazole (DIFLUCAN) tablet 150 mg     150 mg Oral  Once 05/03/19 0804 05/03/19 1243   04/28/19 0400  vancomycin (VANCOREADY) IVPB 1250 mg/250 mL  Status:  Discontinued     1,250 mg 166.7 mL/hr over 90 Minutes Intravenous To Surgery 04/27/19 1131 04/28/19 1451   04/28/19 0400  cefUROXime (ZINACEF) 1.5 g in sodium chloride 0.9 % 100 mL IVPB     1.5 g 200 mL/hr over 30 Minutes Intravenous To Surgery 04/27/19 1131 04/28/19 0819   04/28/19 0400  cefUROXime (ZINACEF) 750 mg in sodium chloride 0.9 % 100 mL IVPB     750 mg 200 mL/hr over 30 Minutes Intravenous To Surgery 04/27/19 1131 04/28/19 1313   04/15/19 0900  vancomycin (VANCOREADY) IVPB 750 mg/150 mL  Status:  Discontinued     750 mg 150 mL/hr over 60 Minutes Intravenous Every 8 hours 04/15/19 0821 05/19/19 1040   04/13/19 1400  vancomycin (VANCOCIN) IVPB 1000 mg/200 mL premix  Status:  Discontinued     1,000 mg 200 mL/hr over 60 Minutes Intravenous Every 8 hours 04/13/19 0839 04/15/19 0821   04/12/19 2200  vancomycin (VANCOREADY) IVPB 750 mg/150 mL  Status:  Discontinued     750 mg 150 mL/hr over 60  Minutes Intravenous Every 8 hours 04/12/19 1500 04/13/19 0839   04/10/19 1336  vancomycin (VANCOCIN) IVPB 1000 mg/200 mL premix  Status:  Discontinued     1,000 mg 200 mL/hr over 60 Minutes Intravenous Every 8 hours 04/10/19 1320 04/12/19 1500   04/08/19 1200  cefTRIAXone (ROCEPHIN) 2 g in sodium chloride 0.9 % 100 mL IVPB     2 g 200 mL/hr over 30 Minutes Intravenous Every 12 hours 04/08/19 1013 06/09/19 2359   04/08/19 0530  vancomycin (VANCOCIN) IVPB 1000 mg/200 mL premix  Status:  Discontinued     1,000 mg 200 mL/hr over 60 Minutes Intravenous Every 12 hours 04/07/19 1541 04/10/19 1320   04/07/19 1900  Ampicillin-Sulbactam (UNASYN) 3 g in sodium chloride 0.9 % 100 mL IVPB  Status:  Discontinued     3 g 200 mL/hr over 30 Minutes Intravenous Every 6 hours 04/07/19 1457 04/08/19 1013   04/07/19 1730  vancomycin (VANCOREADY) IVPB 1500 mg/300 mL     1,500 mg 150 mL/hr over 120 Minutes Intravenous  Once 04/07/19 1541 04/07/19 1937   04/04/19 1400  ceFAZolin (ANCEF) IVPB 2g/100 mL premix  Status:  Discontinued     2 g 200 mL/hr over 30 Minutes Intravenous Every 8 hours 04/04/19 1119 04/07/19 1457   04/04/19 0400  Vancomycin (VANCOCIN) 1,250 mg in sodium chloride 0.9 % 250 mL IVPB  Status:  Discontinued     1,250 mg 166.7 mL/hr over 90 Minutes Intravenous Every 12 hours 04/03/19 1635 04/04/19 1159   04/02/19 1800  ceFEPIme (MAXIPIME) 2 g in sodium chloride 0.9 % 100 mL IVPB  Status:  Discontinued     2 g 200 mL/hr over 30 Minutes Intravenous Every 8 hours 04/02/19 1218 04/04/19 1127   04/02/19 1600  vancomycin (VANCOCIN) IVPB 1000 mg/200 mL premix  Status:  Discontinued     1,000 mg 200 mL/hr over 60 Minutes Intravenous Every 12 hours 04/02/19 0100 04/03/19 1635   04/02/19 0200  piperacillin-tazobactam (ZOSYN) IVPB 3.375 g  Status:  Discontinued     3.375 g 12.5 mL/hr over 240 Minutes Intravenous Every 8 hours 04/02/19 0059 04/02/19 1219   04/02/19 0200  vancomycin (VANCOCIN) 1,500 mg in  sodium chloride 0.9 % 500 mL IVPB     1,500 mg 250 mL/hr over 120 Minutes Intravenous  Once 04/02/19 0100 04/02/19 0923   04/01/19 2040  ceFAZolin (ANCEF) 2-4 GM/100ML-% IVPB    Note to Pharmacy: Channing Mutters   : cabinet override      04/01/19 2040 04/02/19 0844       I have personally reviewed the following labs and images: CBC: No results for input(s): WBC, NEUTROABS, HGB, HCT, MCV, PLT in the last 168 hours. BMP &GFR Recent Labs  Lab 06/03/19 0643  NA 137  K 4.3  CL 106  CO2 23  GLUCOSE 91  BUN 21*  CREATININE 0.64  CALCIUM 8.6*   Estimated Creatinine Clearance: 111.8 mL/min (by C-G formula based on SCr of 0.64 mg/dL). Liver & Pancreas: No results for input(s): AST, ALT, ALKPHOS, BILITOT, PROT, ALBUMIN in the last 168 hours. No results for input(s): LIPASE, AMYLASE in the last 168 hours. No results for input(s): AMMONIA in the last 168 hours. Diabetic: No results for input(s): HGBA1C in the last 72 hours. No results for input(s): GLUCAP in the last 168 hours. Cardiac Enzymes: No results for input(s): CKTOTAL, CKMB, CKMBINDEX, TROPONINI in the last 168 hours. No results for input(s): PROBNP in the last 8760 hours. Coagulation Profile: No results for input(s): INR, PROTIME in the last 168 hours. Thyroid Function Tests: No results for input(s): TSH, T4TOTAL, FREET4, T3FREE, THYROIDAB in the last 72 hours. Lipid Profile: No results for input(s): CHOL, HDL, LDLCALC, TRIG, CHOLHDL, LDLDIRECT in the last 72 hours. Anemia Panel: No results for input(s): VITAMINB12, FOLATE, FERRITIN, TIBC, IRON, RETICCTPCT in the last 72 hours. Urine analysis: No results found for: COLORURINE, APPEARANCEUR, LABSPEC, PHURINE, GLUCOSEU, HGBUR, BILIRUBINUR, KETONESUR, PROTEINUR, UROBILINOGEN, NITRITE, LEUKOCYTESUR Sepsis Labs: Invalid input(s): PROCALCITONIN, LACTICIDVEN  Microbiology: No results found for this or any previous visit (from the past 240 hour(s)).  Radiology Studies: No  results found.    Lauren Reyes T. Lindy Garczynski [facility]  If 7PM-7AM, please contact night-coverage [email or web address] Password [email or web address] 06/06/2019, 11:35 AM

## 2019-06-06 NOTE — Progress Notes (Signed)
      [address]       [place] [address]             [phone]      39 Days Post-Op Procedure(s) (LRB): AORTIC VALVE REPLACEMENT (AVR) using Edwards PERIMOUNT Magna Ease Pericardial Bioprosthesis - 29 MM Aortic Valve. (N/A) REPAIR OF TRICUSPID (N/A) TRANSESOPHAGEAL ECHOCARDIOGRAM (TEE) (N/A) Subjective: Feels good this morning. No complaints  Objective: Vital signs in last 24 hours: Temp:  [97.5 F (36.4 C)-98.6 F (37 C)] 98.4 F (36.9 C) (05/29 0612) Pulse Rate:  [87-103] 103 (05/29 0612) Cardiac Rhythm: Normal sinus rhythm (05/28 1900) Resp:  [15-18] 15 (05/29 0612) BP: (94-113)/(70-96) 113/96 (05/29 0612) SpO2:  [99 %-100 %] 100 % (05/29 0612) Weight:  [83.3 kg] 83.3 kg (05/29 0612)     Intake/Output from previous day: 05/28 0701 - 05/29 0700 In: 600 [P.O.:600] Out: -  Intake/Output this shift: No intake/output data recorded.  General appearance: alert, cooperative and no distress Heart: regular rate and rhythm, S1, S2 normal, no murmur, click, rub or gallop Lungs: clear to auscultation bilaterally Abdomen: soft, non-tender; bowel sounds normal; no masses,  no organomegaly Extremities: extremities normal, atraumatic, no cyanosis or edema Wound: clean and dry  Lab Results: No results for input(s): WBC, HGB, HCT, PLT in the last 72 hours. BMET: No results for input(s): NA, K, CL, CO2, GLUCOSE, BUN, CREATININE, CALCIUM in the last 72 hours.  PT/INR: No results for input(s): LABPROT, INR in the last 72 hours. ABG    Component Value Date/Time   PHART 7.365 04/28/2019 2205   HCO3 24.5 04/28/2019 2205   TCO2 26 04/28/2019 2205   ACIDBASEDEF 1.0 04/28/2019 2205   O2SAT 97.0 04/28/2019 2205   CBG (last 3)  No results for input(s): GLUCAP in the last 72 hours.  Assessment/Plan: S/P Procedure(s) (LRB): AORTIC VALVE REPLACEMENT (AVR) using Edwards PERIMOUNT Magna Ease Pericardial Bioprosthesis - 29 MM Aortic Valve. (N/A) REPAIR OF TRICUSPID  (N/A) TRANSESOPHAGEAL ECHOCARDIOGRAM (TEE) (N/A)  -POD38AV replacement (tissue valve) and TV repair for endocarditis.  IV ABX planned through 5/31. No change in mgt from CTS standpoint.Remains hemodynamically stable. WBC 4.5, no recent fevers Off Oxycodone, no pain  Plan: Will order labs for Monday since she will be discharging Tuesday. Set up to see our service on 6/24.   LOS: 66 days    [name] 06/06/2019

## 2019-06-06 NOTE — Progress Notes (Signed)
Pharmacy Antibiotic Note Lauren Reyes is a 29 y.o. female with endocarditis involving aortic, mitral, and tricuspid valves with severe aortic regurgitation. Patient's condition is further complicated by septic emboli to her brain, lungs, and spleen  Aortic valve tissue culture showing gram stain of rare gram positive cocci. She is s/p AVR/TVRepair on 4/20. Vancomycin doses are being adjusted on troughs alone due to concern of cerebritis.   Vancomycin trough on 5/26 was therapeutic 16 on vancomycin 1250 mg IV every 12 hours. SCr 0.64 was stable.   Plan: Continue Vancomycin 1250 mg iv Q 12 hours Continues ceftriaxone IV 2g q12 per ID Monitor renal function, clinical status End date for antibiotics: 06/08/19  Height: 5\' 6"  (167.6 cm) Weight: 83.3 kg (183 lb 9.6 oz) IBW/kg (Calculated) : 59.3  Temp (24hrs), Avg:98 F (36.7 C), Min:97.5 F (36.4 C), Max:98.6 F (37 C)  Recent Labs  Lab 06/03/19 0643 06/03/19 0820  CREATININE 0.64  --   VANCOTROUGH  --  16    Estimated Creatinine Clearance: 111.8 mL/min (by C-G formula based on SCr of 0.64 mg/dL).    No Known Allergies  Antimicrobials this admission: Zosyn 3/25 >> 3/25 Cefepime 3/25 >> 3/27 Ancef 3/27 >> 3/30 Vanc 3/25 >> 3/27; restarted 3/30 >> (5/31) Unasyn 3/30>>3/31 Ceftriaxone 3/31>> (5/31) Flucon 4/25 x1 Clotrimazole cream 4/25> 5/1   Microbiology results: 3/24 COVID/Flu >> neg 3/25 RCx >> moderate MSSA 3/25 MRSA PCR >> negative 3/25 BCx >> negative  4/16 MRSA PCR >> neg 4/20 MRSA PCR >> neg 4/20 Tricuspid valve specimen: neg 4/20 pericardial fluid: neg 4/20 aortic valve leaflets: 1 with rare gram positive cocci 4/21 CMV: negative 4/28 BCx: Bcx x 2: negative  Thank you for allowing pharmacy to be a part of this patient's care.  5/28, RPh Clinical Pharmacist Please check AMION for all [facility] numbers 06/06/2019 11:10 AM

## 2019-06-06 NOTE — Progress Notes (Signed)
CARDIAC REHAB PHASE I   1315 CR RN in to walk with patient. Patient states she has already ambulated x 3 today and will walk again in hallway later today. Patient encouraged to continue to ambulate as tolerated.    Jaela Yepez Hoover Brunette RN, BSN

## 2019-06-07 NOTE — Progress Notes (Signed)
PROGRESS NOTE  Quinlynn Jazlynne Milliner QVZ:[id number] DOB: May 09, 1990   PCP: Patient, No Pcp Per  Patient is from: Home  DOA: 04/01/2019 LOS: 67  Brief Narrative / Interim history: 29 year old female with past medical history of substance abuse with heroin, crystal meth and cocaine who presented to the ER with aphasia and right-sided weakness and had a code stroke subsequently called. She was admitted to the neurology ICU on 04/01/2019. She was intubated and underwent thrombectomy for left MCA M3 occlusion and recanalization. This was unfortunately complicated by dissection and pseudoaneurysm of the left ICA, s/p stent placement. She was extubated on 04/04/2019. She was found to have a right common femoral vein DVT along with right groin hematoma which was treated with IVC filter placement. Patient had a TEE which showed infective endocarditis and CT surgery was consulted. She is status post bioprosthetic aortic valve replacement and tricuspid valve repair on 04/28/2019 and is on IV antibiotics. CT surgery on board, requested TRH takeover service on 5/6. Plan to complete 6 weeks IV antibiotics while inpatient, last dose 5/31. CTS, IR, Dr. Corliss Skains and ID arranged outpatient follow-ups.  Will order ambulatory referral to neurology on discharge.   Subjective: Seen and examined earlier this morning.  No major events overnight of this morning.  No complaints.  Objective: Vitals:   06/06/19 1950 06/06/19 2300 06/07/19 0510 06/07/19 0735  BP: 110/66 116/83 92/62 92/64   Pulse: 99 (!) 102 89 90  Resp:    16  Temp: 98.2 F (36.8 C) 98.4 F (36.9 C) 97.9 F (36.6 C) 98.6 F (37 C)  TempSrc: Oral Oral Oral Oral  SpO2: 98% 99% 97% 95%  Weight:   83.1 kg   Height:        Intake/Output Summary (Last 24 hours) at 06/07/2019 1200 Last data filed at 06/07/2019 0510 Gross per 24 hour  Intake 240 ml  Output --  Net 240 ml   Filed Weights   06/05/19 0622 06/06/19 0612 06/07/19 0510  Weight:  82.7 kg 83.3 kg 83.1 kg    Examination:  GENERAL: No apparent distress.  Nontoxic. HEENT: MMM.  Vision and hearing grossly intact.  NECK: Supple.  No apparent JVD.  RESP:  No IWOB.  Fair aeration bilaterally. CVS:  RRR. Heart sounds normal.  ABD/GI/GU: BS+. Abd soft, NTND.  MSK/EXT:  Moves extremities. No apparent deformity. No edema.  SKIN: Healed sternotomy wound NEURO: Awake, alert and oriented appropriately.  No apparent focal neuro deficit. PSYCH: Calm. Normal affect.   Procedures:  3/24-left MCA thrombectomy by IR 4/20-AV replacement and TV repair by CTS  Microbiology summarized: 3/24-COVID-19 and influenza A/B PCR negative. 3/25 and 4/20-MRSA PCR negative. 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 -s/p AV replacement and TV repair on 4/26 by Dr. Tyrone Sage -On IV Rocephin and vancomycin for 6 weeks through 06/08/2019 due to history of IVDU -Continue p.o. Lasix 20 mg daily -Outpatient follow-up with ID on 06/22/2019. -Outpatient follow-up with CTS on 07/02/2019.  Acute left MCA embolic CVA s/p thrombectomy with revascularization on 3/24 by IR Left ICA dissection/pseudoaneurysm s/p stent placement-thought to be complication -On aspirin and Plavix for a total of 3 to 6 months -"Order placed to facilitate f/up with Dr. Corliss Skains in clinic 4 wks after discharge" per IR -Outpatient follow-up with neurology-we  will order on discharge.  Right common femoral DVT with right groin hematoma s/p IVC filter placement 04/03/2019 -Not a candidate for [facility] given septic emboli, dissection and hematoma as well as dual antiplatelets -IR to arrange outpatient follow-up in 1 to 2 months after discharge  for IVC filter   ABLA/IDA/B12 and folate deficiency, stable -Continue supplementations  Polysubstance abuse: THC, amphetamine and suspected IVDU -No withdrawal symptoms -Continue counseling  Anxiety: Stable  -Continue BuSpar, Lexapro and Seroquel  Hypotension: Mild.  Asymptomatic. -Continue monitoring   Nutrition Problem: Increased nutrient needs Etiology: acute illness Signs/Symptoms: estimated needs Interventions: Ensure Enlive (each supplement provides 350kcal and 20 grams of protein)   DVT prophylaxis: Start subcu Lovenox Code Status: Full code Family Communication: Patient and/or RN. Available if any question.  Status is: Inpatient  Remains inpatient appropriate because:IV treatments appropriate due to intensity of illness or inability to take PO   Dispo: The patient is from: Home              Anticipated d/c is to: Home              Anticipated d/c date is: 2 days (on 06/09/2019)              Patient currently is not medically stable to d/c.        Consultants:  Cardiothoracic surgery IR Infectious disease Cardiology   Sch Meds:  Scheduled Meds: . aspirin EC  81 mg Oral Daily   Or  . aspirin  81 mg Per Tube Daily  . bisacodyl  10 mg Oral Daily   Or  . bisacodyl  10 mg Rectal Daily  . busPIRone  7.5 mg Oral BID  . Chlorhexidine Gluconate Cloth  6 each Topical Daily  . clopidogrel  75 mg Oral Daily  . [place] [facility], Patient & Family Education   Does not apply Once  . docusate sodium  200 mg Oral Daily  . escitalopram  10 mg Oral Daily  . feeding supplement (ENSURE ENLIVE)  237 mL Oral BID BM  . ferrous fumarate-b12-vitamic C-folic acid  1 capsule Oral BID PC  . furosemide  20 mg Oral Daily  . metoprolol tartrate  6.25 mg Oral BID  . pantoprazole  40 mg Oral Daily  . pneumococcal 23 valent vaccine  0.5 mL Intramuscular Tomorrow-1000  . potassium chloride  20 mEq Oral TID  . QUEtiapine  25 mg Oral QHS  . sodium chloride flush  3 mL  Intravenous Q12H   Continuous Infusions: . sodium chloride    . sodium chloride    . cefTRIAXone (ROCEPHIN)  IV 2 g (06/07/19 0840)  . lactated ringers Stopped (04/30/19 2141)  . vancomycin 1,250 mg (06/07/19 0926)   PRN Meds:.sodium chloride, acetaminophen, diphenhydrAMINE, ondansetron (ZOFRAN) IV, polyethylene glycol, sodium chloride flush  Antimicrobials: Anti-infectives (From admission, onward)   Start     Dose/Rate Route Frequency Ordered Stop   05/22/19 2100  vancomycin (VANCOREADY) IVPB 1250 mg/250 mL     1,250 mg 166.7 mL/hr over 90 Minutes Intravenous Every 12 hours 05/22/19 0937 06/09/19 1059   05/19/19 2100  vancomycin (VANCOCIN) IVPB 1000 mg/200 mL premix  Status:  Discontinued     1,000 mg 200 mL/hr over 60 Minutes Intravenous Every 12 hours 05/19/19 1040 05/22/19 0937   05/03/19 1000  fluconazole (DIFLUCAN) tablet 150 mg     150 mg Oral  Once 05/03/19 0804 05/03/19 1243   04/28/19 0400  vancomycin (VANCOREADY) IVPB 1250 mg/250  mL  Status:  Discontinued     1,250 mg 166.7 mL/hr over 90 Minutes Intravenous To Surgery 04/27/19 1131 04/28/19 1451   04/28/19 0400  cefUROXime (ZINACEF) 1.5 g in sodium chloride 0.9 % 100 mL IVPB     1.5 g 200 mL/hr over 30 Minutes Intravenous To Surgery 04/27/19 1131 04/28/19 0819   04/28/19 0400  cefUROXime (ZINACEF) 750 mg in sodium chloride 0.9 % 100 mL IVPB     750 mg 200 mL/hr over 30 Minutes Intravenous To Surgery 04/27/19 1131 04/28/19 1313   04/15/19 0900  vancomycin (VANCOREADY) IVPB 750 mg/150 mL  Status:  Discontinued     750 mg 150 mL/hr over 60 Minutes Intravenous Every 8 hours 04/15/19 0821 05/19/19 1040   04/13/19 1400  vancomycin (VANCOCIN) IVPB 1000 mg/200 mL premix  Status:  Discontinued     1,000 mg 200 mL/hr over 60 Minutes Intravenous Every 8 hours 04/13/19 0839 04/15/19 0821   04/12/19 2200  vancomycin (VANCOREADY) IVPB 750 mg/150 mL  Status:  Discontinued     750 mg 150 mL/hr over 60 Minutes Intravenous Every 8  hours 04/12/19 1500 04/13/19 0839   04/10/19 1336  vancomycin (VANCOCIN) IVPB 1000 mg/200 mL premix  Status:  Discontinued     1,000 mg 200 mL/hr over 60 Minutes Intravenous Every 8 hours 04/10/19 1320 04/12/19 1500   04/08/19 1200  cefTRIAXone (ROCEPHIN) 2 g in sodium chloride 0.9 % 100 mL IVPB     2 g 200 mL/hr over 30 Minutes Intravenous Every 12 hours 04/08/19 1013 06/09/19 2359   04/08/19 0530  vancomycin (VANCOCIN) IVPB 1000 mg/200 mL premix  Status:  Discontinued     1,000 mg 200 mL/hr over 60 Minutes Intravenous Every 12 hours 04/07/19 1541 04/10/19 1320   04/07/19 1900  Ampicillin-Sulbactam (UNASYN) 3 g in sodium chloride 0.9 % 100 mL IVPB  Status:  Discontinued     3 g 200 mL/hr over 30 Minutes Intravenous Every 6 hours 04/07/19 1457 04/08/19 1013   04/07/19 1730  vancomycin (VANCOREADY) IVPB 1500 mg/300 mL     1,500 mg 150 mL/hr over 120 Minutes Intravenous  Once 04/07/19 1541 04/07/19 1937   04/04/19 1400  ceFAZolin (ANCEF) IVPB 2g/100 mL premix  Status:  Discontinued     2 g 200 mL/hr over 30 Minutes Intravenous Every 8 hours 04/04/19 1119 04/07/19 1457   04/04/19 0400  Vancomycin (VANCOCIN) 1,250 mg in sodium chloride 0.9 % 250 mL IVPB  Status:  Discontinued     1,250 mg 166.7 mL/hr over 90 Minutes Intravenous Every 12 hours 04/03/19 1635 04/04/19 1159   04/02/19 1800  ceFEPIme (MAXIPIME) 2 g in sodium chloride 0.9 % 100 mL IVPB  Status:  Discontinued     2 g 200 mL/hr over 30 Minutes Intravenous Every 8 hours 04/02/19 1218 04/04/19 1127   04/02/19 1600  vancomycin (VANCOCIN) IVPB 1000 mg/200 mL premix  Status:  Discontinued     1,000 mg 200 mL/hr over 60 Minutes Intravenous Every 12 hours 04/02/19 0100 04/03/19 1635   04/02/19 0200  piperacillin-tazobactam (ZOSYN) IVPB 3.375 g  Status:  Discontinued     3.375 g 12.5 mL/hr over 240 Minutes Intravenous Every 8 hours 04/02/19 0059 04/02/19 1219   04/02/19 0200  vancomycin (VANCOCIN) 1,500 mg in sodium chloride 0.9 % 500 mL  IVPB     1,500 mg 250 mL/hr over 120 Minutes Intravenous  Once 04/02/19 0100 04/02/19 0923   04/01/19 2040  ceFAZolin (ANCEF) 2-4 GM/100ML-% IVPB  Note to Pharmacy: [name]   : cabinet override      04/01/19 2040 04/02/19 0844       I have personally reviewed the following labs and images: CBC: No results for input(s): WBC, NEUTROABS, HGB, HCT, MCV, PLT in the last 168 hours. BMP &GFR Recent Labs  Lab 06/03/19 0643  NA 137  K 4.3  CL 106  CO2 23  GLUCOSE 91  BUN 21*  CREATININE 0.64  CALCIUM 8.6*   Estimated Creatinine Clearance: 111.7 mL/min (by C-G formula based on SCr of 0.64 mg/dL). Liver & Pancreas: No results for input(s): AST, ALT, ALKPHOS, BILITOT, PROT, ALBUMIN in the last 168 hours. No results for input(s): LIPASE, AMYLASE in the last 168 hours. No results for input(s): AMMONIA in the last 168 hours. Diabetic: No results for input(s): HGBA1C in the last 72 hours. No results for input(s): GLUCAP in the last 168 hours. Cardiac Enzymes: No results for input(s): CKTOTAL, CKMB, CKMBINDEX, TROPONINI in the last 168 hours. No results for input(s): PROBNP in the last 8760 hours. Coagulation Profile: No results for input(s): INR, PROTIME in the last 168 hours. Thyroid Function Tests: No results for input(s): TSH, T4TOTAL, FREET4, T3FREE, THYROIDAB in the last 72 hours. Lipid Profile: No results for input(s): CHOL, HDL, LDLCALC, TRIG, CHOLHDL, LDLDIRECT in the last 72 hours. Anemia Panel: No results for input(s): VITAMINB12, FOLATE, FERRITIN, TIBC, IRON, RETICCTPCT in the last 72 hours. Urine analysis: No results found for: COLORURINE, APPEARANCEUR, LABSPEC, PHURINE, GLUCOSEU, HGBUR, BILIRUBINUR, KETONESUR, PROTEINUR, UROBILINOGEN, NITRITE, LEUKOCYTESUR Sepsis Labs: Invalid input(s): PROCALCITONIN, [place]  Microbiology: No results found for this or any previous visit (from the past 240 hour(s)).  Radiology Studies: No results found.    Calissa Swenor T.  [place]  If 7PM-7AM, please contact night-coverage [email or web address] Password [email or web address] 06/07/2019, 12:00 PM

## 2019-06-07 NOTE — Progress Notes (Signed)
      [address]       [place] [address]             [phone]      40 Days Post-Op Procedure(s) (LRB): AORTIC VALVE REPLACEMENT (AVR) using Edwards PERIMOUNT Magna Ease Pericardial Bioprosthesis - 29 MM Aortic Valve. (N/A) REPAIR OF TRICUSPID (N/A) TRANSESOPHAGEAL ECHOCARDIOGRAM (TEE) (N/A) Subjective: Feels good today, didn't sleep as well last night.   Objective: Vital signs in last 24 hours: Temp:  [97.9 F (36.6 C)-98.6 F (37 C)] 98.6 F (37 C) (05/30 0735) Pulse Rate:  [89-102] 90 (05/30 0735) Cardiac Rhythm: Normal sinus rhythm (05/30 0839) Resp:  [15-16] 16 (05/30 0735) BP: (92-116)/(62-83) 92/64 (05/30 0735) SpO2:  [95 %-100 %] 95 % (05/30 0735) Weight:  [83.1 kg] 83.1 kg (05/30 0510)     Intake/Output from previous day: 05/29 0701 - 05/30 0700 In: 240 [P.O.:240] Out: -  Intake/Output this shift: No intake/output data recorded.  General appearance: alert, cooperative and no distress Heart: regular rate and rhythm, S1, S2 normal, no murmur, click, rub or gallop Lungs: clear to auscultation bilaterally Abdomen: soft, non-tender; bowel sounds normal; no masses,  no organomegaly Extremities: extremities normal, atraumatic, no cyanosis or edema Wound: clean and dry  Lab Results: No results for input(s): WBC, HGB, HCT, PLT in the last 72 hours. BMET: No results for input(s): NA, K, CL, CO2, GLUCOSE, BUN, CREATININE, CALCIUM in the last 72 hours.  PT/INR: No results for input(s): LABPROT, INR in the last 72 hours. ABG    Component Value Date/Time   PHART 7.365 04/28/2019 2205   HCO3 24.5 04/28/2019 2205   TCO2 26 04/28/2019 2205   ACIDBASEDEF 1.0 04/28/2019 2205   O2SAT 97.0 04/28/2019 2205   CBG (last 3)  No results for input(s): GLUCAP in the last 72 hours.  Assessment/Plan: S/P Procedure(s) (LRB): AORTIC VALVE REPLACEMENT (AVR) using Edwards PERIMOUNT Magna Ease Pericardial Bioprosthesis - 29 MM Aortic Valve. (N/A) REPAIR OF  TRICUSPID (N/A) TRANSESOPHAGEAL ECHOCARDIOGRAM (TEE) (N/A)  -POD39AV replacement (tissue valve) and TV repair for endocarditis.  IV ABX planned through 5/31. No change in mgt from CTS standpoint.Remains hemodynamically stable. WBC 4.5, no recent fevers Off Oxycodone, no pain  Plan: labs ordered for Monday. Planned discharge Tuesday after final dose of abx.    LOS: 67 days    [name] 06/07/2019

## 2019-06-08 LAB — BASIC METABOLIC PANEL
Anion gap: 5 (ref 5–15)
BUN: 21 mg/dL — ABNORMAL HIGH (ref 6–20)
CO2: 26 mmol/L (ref 22–32)
Calcium: 8.6 mg/dL — ABNORMAL LOW (ref 8.9–10.3)
Chloride: 109 mmol/L (ref 98–111)
Creatinine, Ser: 0.73 mg/dL (ref 0.44–1.00)
GFR calc Af Amer: >60 mL/min (ref >60)
GFR calc non Af Amer: >60 mL/min (ref >60)
Glucose, Bld: 88 mg/dL (ref 70–99)
Potassium: 4.3 mmol/L (ref 3.5–5.1)
Sodium: 140 mmol/L (ref 135–145)

## 2019-06-08 LAB — CBC WITH DIFFERENTIAL/PLATELET
Abs Immature Granulocytes: 0.01 10*3/uL (ref 0.00–0.07)
Basophils Absolute: 0.1 10*3/uL (ref 0.0–0.1)
Basophils Relative: 1 %
Eosinophils Absolute: 0.6 10*3/uL — ABNORMAL HIGH (ref 0.0–0.5)
Eosinophils Relative: 13 %
HCT: 33.4 % — ABNORMAL LOW (ref 36.0–46.0)
Hemoglobin: 10.1 g/dL — ABNORMAL LOW (ref 12.0–15.0)
Immature Granulocytes: 0 %
Lymphocytes Relative: 57 %
Lymphs Abs: 2.6 10*3/uL (ref 0.7–4.0)
MCH: 26.4 pg (ref 26.0–34.0)
MCHC: 30.2 g/dL (ref 30.0–36.0)
MCV: 87.4 fL (ref 80.0–100.0)
Monocytes Absolute: 0.3 10*3/uL (ref 0.1–1.0)
Monocytes Relative: 7 %
Neutro Abs: 1 10*3/uL — ABNORMAL LOW (ref 1.7–7.7)
Neutrophils Relative %: 22 %
Platelets: 283 10*3/uL (ref 150–400)
RBC: 3.82 MIL/uL — ABNORMAL LOW (ref 3.87–5.11)
RDW: 15.1 % (ref 11.5–15.5)
WBC: 4.5 10*3/uL (ref 4.0–10.5)
nRBC: 0 % (ref 0.0–0.2)

## 2019-06-08 LAB — FERRITIN: Ferritin: 38 ng/mL (ref 11–307)

## 2019-06-08 LAB — FOLATE: Folate: 24.9 ng/mL (ref >5.9)

## 2019-06-08 LAB — IRON AND TIBC
Iron: 32 ug/dL (ref 28–170)
Saturation Ratios: 9 % — ABNORMAL LOW (ref 10.4–31.8)
TIBC: 372 ug/dL (ref 250–450)
UIBC: 340 ug/dL

## 2019-06-08 LAB — RETICULOCYTES
Immature Retic Fract: 10.2 % (ref 2.3–15.9)
RBC.: 4.07 MIL/uL (ref 3.87–5.11)
Retic Count, Absolute: 38.7 10*3/uL (ref 19.0–186.0)
Retic Ct Pct: 1 % (ref 0.4–3.1)

## 2019-06-08 LAB — VITAMIN B12: Vitamin B-12: 323 pg/mL (ref 180–914)

## 2019-06-08 MED ORDER — SODIUM CHLORIDE 0.9 % IV SOLN
510.0000 mg | Freq: Once | INTRAVENOUS | Status: AC
  Administered 2019-06-08: 510 mg via INTRAVENOUS
  Filled 2019-06-08: qty 17

## 2019-06-08 NOTE — Progress Notes (Signed)
Patient ambulated in hallway with family. Sadako Cegielski Jessup RN  

## 2019-06-08 NOTE — Progress Notes (Signed)
PROGRESS NOTE  Lauren Reyes YTK:[id number] DOB: February 23, 1990   PCP: Patient, No Pcp Per  Patient is from: Home  DOA: 04/01/2019 LOS: 68  Brief Narrative / Interim history: 29 year old female with past medical history of substance abuse with heroin, crystal meth and cocaine who presented to the ER with aphasia and right-sided weakness and had a code stroke subsequently called. She was admitted to the neurology ICU on 04/01/2019. She was intubated and underwent thrombectomy for left MCA M3 occlusion and recanalization. This was unfortunately complicated by dissection and pseudoaneurysm of the left ICA, s/p stent placement. She was extubated on 04/04/2019. She was found to have a right common femoral vein DVT along with right groin hematoma which was treated with IVC filter placement. Patient had a TEE which showed infective endocarditis and CT surgery was consulted. She is status post bioprosthetic aortic valve replacement and tricuspid valve repair on 04/28/2019 and is on IV antibiotics. CT surgery on board, requested TRH takeover service on 5/6. Plan to complete 6 weeks IV antibiotics while inpatient, last dose 5/31. CTS, IR, Dr. Corliss Skains and ID arranged outpatient follow-ups.  Will order ambulatory referral to neurology on discharge.   Subjective: Seen and examined earlier this morning.  No major events overnight of this morning.  No complaints.  Objective: Vitals:   06/07/19 2255 06/08/19 0510 06/08/19 0937 06/08/19 1220  BP: 102/72  139/73 92/63  Pulse: 99  88 88  Resp:   16 14  Temp: (!) 97 F (36.1 C) 98 F (36.7 C) 98.4 F (36.9 C) 97.9 F (36.6 C)  TempSrc: Oral Oral Oral Oral  SpO2: 100%  100% 96%  Weight:  83.2 kg    Height:        Intake/Output Summary (Last 24 hours) at 06/08/2019 1422 Last data filed at 06/07/2019 2123 Gross per 24 hour  Intake 120 ml  Output -  Net 120 ml   Filed Weights   06/06/19 0612 06/07/19 0510 06/08/19 0510  Weight: 83.3 kg 83.1  kg 83.2 kg    Examination:  GENERAL: No apparent distress.  Nontoxic. HEENT: MMM.  Vision and hearing grossly intact.  NECK: Supple.  No apparent JVD.  RESP:  No IWOB.  Fair aeration bilaterally. CVS:  RRR. Heart sounds normal.  ABD/GI/GU: BS+. Abd soft, NTND.  MSK/EXT:  Moves extremities. No apparent deformity. No edema.  SKIN: no apparent skin lesion or wound NEURO: Awake, alert and oriented appropriately.  No apparent focal neuro deficit. PSYCH: Calm. Normal affect.  Procedures:  3/24-left MCA thrombectomy by IR 4/20-AV replacement and TV repair by CTS  Microbiology summarized: 3/24-COVID-19 and influenza A/B PCR negative. 3/25 and 4/20-MRSA PCR negative. 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 -s/p AV replacement and TV repair on 4/26 by Dr. Tyrone Sage -On IV Rocephin and vancomycin for 6 weeks through 06/08/2019 due to history of IVDU -Continue p.o. Lasix 20 mg daily -Outpatient follow-up with ID on 06/22/2019. -Outpatient follow-up with CTS on 07/02/2019.  Acute left MCA embolic CVA s/p thrombectomy with revascularization on 3/24 by IR Left ICA dissection/pseudoaneurysm s/p stent placement-thought to be complication -On aspirin and Plavix for a total of 3 to 6 months -"Order placed to facilitate f/up with Dr. Corliss Skains in clinic 4 wks after discharge" per IR -Outpatient follow-up  with neurology-we will order on discharge.  Right common femoral DVT with right groin hematoma s/p IVC filter placement 04/03/2019 -Not a candidate for [facility] given septic emboli, dissection and hematoma as well as dual antiplatelets -IR to arrange outpatient follow-up in 1 to 2 months after discharge for  IVC filter   ABLA/IDA/B12 and folate deficiency: Iron sat 9%.  H&H stable -IV Feraheme on 5/31 -Continue p.o. multivitamin with iron.  Polysubstance abuse: THC, amphetamine and suspected IVDU -No withdrawal symptoms -Continue counseling  Anxiety: Stable  -Continue BuSpar, Lexapro and Seroquel  Hypotension: Mild.  Asymptomatic. -Continue monitoring   Nutrition Problem: Increased nutrient needs Etiology: acute illness Signs/Symptoms: estimated needs Interventions: Ensure Enlive (each supplement provides 350kcal and 20 grams of protein)   DVT prophylaxis: Start subcu Lovenox Code Status: Full code Family Communication: Patient and/or RN. Available if any question.  Status is: Inpatient  Remains inpatient appropriate because:IV treatments appropriate due to intensity of illness or inability to take PO   Dispo: The patient is from: Home              Anticipated d/c is to: Home              Anticipated d/c date is: 1 day (on 06/09/2019)              Patient currently is not medically stable to d/c.        Consultants:  Cardiothoracic surgery IR Infectious disease Cardiology   Sch Meds:  Scheduled Meds: . aspirin EC  81 mg Oral Daily   Or  . aspirin  81 mg Per Tube Daily  . bisacodyl  10 mg Oral Daily   Or  . bisacodyl  10 mg Rectal Daily  . busPIRone  7.5 mg Oral BID  . Chlorhexidine Gluconate Cloth  6 each Topical Daily  . clopidogrel  75 mg Oral Daily  . [facility], Patient & Family Education   Does not apply Once  . docusate sodium  200 mg Oral Daily  . escitalopram  10 mg Oral Daily  . feeding supplement (ENSURE ENLIVE)  237 mL Oral BID BM  . ferrous [id number] C-folic acid  1 capsule Oral BID PC  . furosemide  20 mg Oral Daily  . metoprolol tartrate  6.25 mg Oral BID  . pantoprazole  40 mg Oral Daily  . pneumococcal 23 valent vaccine  0.5 mL Intramuscular Tomorrow-1000  . potassium chloride  20 mEq Oral TID  . QUEtiapine   25 mg Oral QHS  . sodium chloride flush  3 mL Intravenous Q12H   Continuous Infusions: . sodium chloride    . sodium chloride    . cefTRIAXone (ROCEPHIN)  IV 2 g (06/08/19 1032)  . lactated ringers Stopped (04/30/19 2141)  . vancomycin 1,250 mg (06/08/19 1112)   PRN Meds:.sodium chloride, acetaminophen, diphenhydrAMINE, ondansetron (ZOFRAN) IV, polyethylene glycol, sodium chloride flush  Antimicrobials: Anti-infectives (From admission, onward)   Start     Dose/Rate Route Frequency Ordered Stop   05/22/19 2100  vancomycin (VANCOREADY) IVPB 1250 mg/250 mL     1,250 mg 166.7 mL/hr over 90 Minutes Intravenous Every 12 hours 05/22/19 0937 06/09/19 1059   05/19/19 2100  vancomycin (VANCOCIN) IVPB 1000 mg/200 mL premix  Status:  Discontinued     1,000 mg 200 mL/hr over 60 Minutes Intravenous Every 12 hours 05/19/19 1040 05/22/19 0937   05/03/19 1000  fluconazole (DIFLUCAN) tablet 150 mg     150 mg Oral  Once  05/03/19 0804 05/03/19 1243   04/28/19 0400  vancomycin (VANCOREADY) IVPB 1250 mg/250 mL  Status:  Discontinued     1,250 mg 166.7 mL/hr over 90 Minutes Intravenous To Surgery 04/27/19 1131 04/28/19 1451   04/28/19 0400  cefUROXime (ZINACEF) 1.5 g in sodium chloride 0.9 % 100 mL IVPB     1.5 g 200 mL/hr over 30 Minutes Intravenous To Surgery 04/27/19 1131 04/28/19 0819   04/28/19 0400  cefUROXime (ZINACEF) 750 mg in sodium chloride 0.9 % 100 mL IVPB     750 mg 200 mL/hr over 30 Minutes Intravenous To Surgery 04/27/19 1131 04/28/19 1313   04/15/19 0900  vancomycin (VANCOREADY) IVPB 750 mg/150 mL  Status:  Discontinued     750 mg 150 mL/hr over 60 Minutes Intravenous Every 8 hours 04/15/19 0821 05/19/19 1040   04/13/19 1400  vancomycin (VANCOCIN) IVPB 1000 mg/200 mL premix  Status:  Discontinued     1,000 mg 200 mL/hr over 60 Minutes Intravenous Every 8 hours 04/13/19 0839 04/15/19 0821   04/12/19 2200  vancomycin (VANCOREADY) IVPB 750 mg/150 mL  Status:  Discontinued     750 mg  150 mL/hr over 60 Minutes Intravenous Every 8 hours 04/12/19 1500 04/13/19 0839   04/10/19 1336  vancomycin (VANCOCIN) IVPB 1000 mg/200 mL premix  Status:  Discontinued     1,000 mg 200 mL/hr over 60 Minutes Intravenous Every 8 hours 04/10/19 1320 04/12/19 1500   04/08/19 1200  cefTRIAXone (ROCEPHIN) 2 g in sodium chloride 0.9 % 100 mL IVPB     2 g 200 mL/hr over 30 Minutes Intravenous Every 12 hours 04/08/19 1013 06/09/19 2359   04/08/19 0530  vancomycin (VANCOCIN) IVPB 1000 mg/200 mL premix  Status:  Discontinued     1,000 mg 200 mL/hr over 60 Minutes Intravenous Every 12 hours 04/07/19 1541 04/10/19 1320   04/07/19 1900  Ampicillin-Sulbactam (UNASYN) 3 g in sodium chloride 0.9 % 100 mL IVPB  Status:  Discontinued     3 g 200 mL/hr over 30 Minutes Intravenous Every 6 hours 04/07/19 1457 04/08/19 1013   04/07/19 1730  vancomycin (VANCOREADY) IVPB 1500 mg/300 mL     1,500 mg 150 mL/hr over 120 Minutes Intravenous  Once 04/07/19 1541 04/07/19 1937   04/04/19 1400  ceFAZolin (ANCEF) IVPB 2g/100 mL premix  Status:  Discontinued     2 g 200 mL/hr over 30 Minutes Intravenous Every 8 hours 04/04/19 1119 04/07/19 1457   04/04/19 0400  Vancomycin (VANCOCIN) 1,250 mg in sodium chloride 0.9 % 250 mL IVPB  Status:  Discontinued     1,250 mg 166.7 mL/hr over 90 Minutes Intravenous Every 12 hours 04/03/19 1635 04/04/19 1159   04/02/19 1800  ceFEPIme (MAXIPIME) 2 g in sodium chloride 0.9 % 100 mL IVPB  Status:  Discontinued     2 g 200 mL/hr over 30 Minutes Intravenous Every 8 hours 04/02/19 1218 04/04/19 1127   04/02/19 1600  vancomycin (VANCOCIN) IVPB 1000 mg/200 mL premix  Status:  Discontinued     1,000 mg 200 mL/hr over 60 Minutes Intravenous Every 12 hours 04/02/19 0100 04/03/19 1635   04/02/19 0200  piperacillin-tazobactam (ZOSYN) IVPB 3.375 g  Status:  Discontinued     3.375 g 12.5 mL/hr over 240 Minutes Intravenous Every 8 hours 04/02/19 0059 04/02/19 1219   04/02/19 0200  vancomycin  (VANCOCIN) 1,500 mg in sodium chloride 0.9 % 500 mL IVPB     1,500 mg 250 mL/hr over 120 Minutes Intravenous  Once  04/02/19 0100 04/02/19 0923   04/01/19 2040  ceFAZolin (ANCEF) 2-4 GM/100ML-% IVPB    Note to Pharmacy: Channing Mutters   : cabinet override      04/01/19 2040 04/02/19 0844       I have personally reviewed the following labs and images: CBC: Recent Labs  Lab 06/08/19 0500  WBC 4.5  NEUTROABS 1.0*  HGB 10.1*  HCT 33.4*  MCV 87.4  PLT 283   BMP &GFR Recent Labs  Lab 06/03/19 0643 06/08/19 0527  NA 137 140  K 4.3 4.3  CL 106 109  CO2 23 26  GLUCOSE 91 88  BUN 21* 21*  CREATININE 0.64 0.73  CALCIUM 8.6* 8.6*   Estimated Creatinine Clearance: 111.8 mL/min (by C-G formula based on SCr of 0.73 mg/dL). Liver & Pancreas: No results for input(s): AST, ALT, ALKPHOS, BILITOT, PROT, ALBUMIN in the last 168 hours. No results for input(s): LIPASE, AMYLASE in the last 168 hours. No results for input(s): AMMONIA in the last 168 hours. Diabetic: No results for input(s): HGBA1C in the last 72 hours. No results for input(s): GLUCAP in the last 168 hours. Cardiac Enzymes: No results for input(s): CKTOTAL, CKMB, CKMBINDEX, TROPONINI in the last 168 hours. No results for input(s): PROBNP in the last 8760 hours. Coagulation Profile: No results for input(s): INR, PROTIME in the last 168 hours. Thyroid Function Tests: No results for input(s): TSH, T4TOTAL, FREET4, T3FREE, THYROIDAB in the last 72 hours. Lipid Profile: No results for input(s): CHOL, HDL, LDLCALC, TRIG, CHOLHDL, LDLDIRECT in the last 72 hours. Anemia Panel: Recent Labs    06/08/19 0800  VITAMINB12 323  FOLATE 24.9  FERRITIN 38  TIBC 372  IRON 32  RETICCTPCT 1.0   Urine analysis: No results found for: COLORURINE, APPEARANCEUR, LABSPEC, PHURINE, GLUCOSEU, HGBUR, BILIRUBINUR, KETONESUR, PROTEINUR, UROBILINOGEN, NITRITE, LEUKOCYTESUR Sepsis Labs: Invalid input(s): PROCALCITONIN, LACTICIDVEN   Microbiology: No results found for this or any previous visit (from the past 240 hour(s)).  Radiology Studies: No results found.    Taye T. Gonfa [facility]  If 7PM-7AM, please contact night-coverage [email or web address] Password [email or web address] 06/08/2019, 2:22 PM

## 2019-06-08 NOTE — Progress Notes (Addendum)
      [address]       [place] [address]             [phone]      41 Days Post-Op Procedure(s) (LRB): AORTIC VALVE REPLACEMENT (AVR) using Edwards PERIMOUNT Magna Ease Pericardial Bioprosthesis - 29 MM Aortic Valve. (N/A) REPAIR OF TRICUSPID (N/A) TRANSESOPHAGEAL ECHOCARDIOGRAM (TEE) (N/A) Subjective: Feels excited about leaving the hospital tomorrow. States that she plans to go grocery shopping first.   Objective: Vital signs in last 24 hours: Temp:  [97 F (36.1 C)-98.4 F (36.9 C)] 98.4 F (36.9 C) (05/31 0937) Pulse Rate:  [68-100] 88 (05/31 0937) Cardiac Rhythm: Normal sinus rhythm (05/31 0759) Resp:  [16-18] 16 (05/31 0937) BP: (97-139)/(68-73) 139/73 (05/31 0937) SpO2:  [96 %-100 %] 100 % (05/31 0937) Weight:  [83.2 kg] 83.2 kg (05/31 0510)     Intake/Output from previous day: 05/30 0701 - 05/31 0700 In: 680 [P.O.:680] Out: -  Intake/Output this shift: No intake/output data recorded.  General appearance: alert, cooperative and no distress Heart: regular rate and rhythm, S1, S2 normal, no murmur, click, rub or gallop Lungs: clear to auscultation bilaterally Abdomen: soft, non-tender; bowel sounds normal; no masses,  no organomegaly Extremities: extremities normal, atraumatic, no cyanosis or edema Wound: clean and dry  Lab Results: Recent Labs    06/08/19 0500  WBC 4.5  HGB 10.1*  HCT 33.4*  PLT 283   BMET:  Recent Labs    06/08/19 0527  NA 140  K 4.3  CL 109  CO2 26  GLUCOSE 88  BUN 21*  CREATININE 0.73  CALCIUM 8.6*    PT/INR: No results for input(s): LABPROT, INR in the last 72 hours. ABG    Component Value Date/Time   PHART 7.365 04/28/2019 2205   HCO3 24.5 04/28/2019 2205   TCO2 26 04/28/2019 2205   ACIDBASEDEF 1.0 04/28/2019 2205   O2SAT 97.0 04/28/2019 2205   CBG (last 3)  No results for input(s): GLUCAP in the last 72 hours.  Assessment/Plan: S/P Procedure(s) (LRB): AORTIC VALVE REPLACEMENT (AVR)  using Edwards PERIMOUNT Magna Ease Pericardial Bioprosthesis - 29 MM Aortic Valve. (N/A) REPAIR OF TRICUSPID (N/A) TRANSESOPHAGEAL ECHOCARDIOGRAM (TEE) (N/A)   -POD40AV replacement (tissue valve) and TV repair for endocarditis.  IV ABX planned through 5/31. No change in mgt from CTS standpoint.Remains hemodynamically stable. WBC 4.5, no recent fevers. Creatinine 0.73, electrolytes okay.  Off Oxycodone, no pain  Plan: discharge tomorrow. Will need help with outpatient drug therapy, good family support with mother and sister. Follow-up appointment for our service is in the chart.    LOS: 68 days    [name] 06/08/2019 d/c tomorrow Stable from surgery  I have seen and examined Lauren Reyes and agree with the above assessment  and plan.  Delight Ovens MD Beeper [phone] Office [phone] 06/08/2019 11:06 AM

## 2019-06-09 DIAGNOSIS — I5022 Chronic systolic (congestive) heart failure: Secondary | ICD-10-CM

## 2019-06-09 DIAGNOSIS — D509 Iron deficiency anemia, unspecified: Secondary | ICD-10-CM

## 2019-06-09 MED ORDER — METOPROLOL TARTRATE 25 MG PO TABS
12.5000 mg | ORAL_TABLET | Freq: Two times a day (BID) | ORAL | 2 refills | Status: DC
Start: 2019-06-09 — End: 2019-09-30

## 2019-06-09 MED ORDER — FUROSEMIDE 20 MG PO TABS: 20.0000 mg | ORAL_TABLET | Freq: Every day | ORAL | 1 refills | Status: DC

## 2019-06-09 MED ORDER — BUSPIRONE HCL 7.5 MG PO TABS: 7.5000 mg | ORAL_TABLET | Freq: Two times a day (BID) | ORAL | 1 refills | Status: DC

## 2019-06-09 MED ORDER — ESCITALOPRAM OXALATE 10 MG PO TABS: 10.0000 mg | ORAL_TABLET | Freq: Every day | ORAL | 1 refills | Status: AC

## 2019-06-09 MED ORDER — CLOPIDOGREL BISULFATE 75 MG PO TABS: 75.0000 mg | ORAL_TABLET | Freq: Every day | ORAL | 1 refills | Status: DC

## 2019-06-09 MED ORDER — FE FUMARATE-B12-VIT C-FA-IFC PO CAPS: 1.0000 | ORAL_CAPSULE | Freq: Two times a day (BID) | ORAL | 1 refills | Status: DC

## 2019-06-09 MED ORDER — POTASSIUM CHLORIDE CRYS ER 20 MEQ PO TBCR: 20.0000 meq | EXTENDED_RELEASE_TABLET | Freq: Every day | ORAL | 1 refills | Status: DC

## 2019-06-09 MED ORDER — ASPIRIN 81 MG PO TBEC: 81.0000 mg | DELAYED_RELEASE_TABLET | Freq: Every day | ORAL | 1 refills | Status: DC

## 2019-06-09 MED FILL — CLOPIDOGREL 75 MG TABLET: 75 | 30 days supply | Qty: 30 | Fill #0

## 2019-06-09 MED FILL — ESCITALOPRAM 10 MG TABLET: 10 | 30 days supply | Qty: 30 | Fill #0

## 2019-06-09 MED FILL — POTASSIUM CHLORIDE 20meqER: 20 | 30 days supply | Qty: 30 | Fill #0

## 2019-06-09 MED FILL — METOPROLOL TARTRATE 25 MG T: 25 | 30 days supply | Qty: 30 | Fill #0

## 2019-06-09 MED FILL — FUROSEMIDE 20 MG TAB: 20 | 30 days supply | Qty: 30 | Fill #0

## 2019-06-09 MED FILL — FEROCON CAPSULE: 30 days supply | Qty: 60 | Fill #0

## 2019-06-09 MED FILL — busPIRone HCL 15 MG TABS: 15 | 30 days supply | Qty: 30 | Fill #0

## 2019-06-09 NOTE — TOC Transition Note (Signed)
Transition of Care [facility]) - CM/SW Discharge Note Donn Pierini RN, BSN Transitions of Care Unit 4E- RN Case Manager [phone]   Patient Details  Name: Lauren Reyes MRN: [id number] Date of Birth: June 25, 1990  Transition of Care [facility]) CM/SW Contact:  Darrold Span, RN Phone Number: 06/09/2019, 12:17 PM   Clinical Narrative:    Pt stable for transition home today, completed her IV antibiotics yesterday, hx IVDU. TOC pharmacy to fill medications prior to discharge- pt will be assisted with [facility] program- per pt her mother will assist with copay cost for medications. Mom is also coming to transport pt home. Per pt she plans to f/u with [facility] for further drug rehab needs- pt states she does not have a PCP but would like info on local clinics both near [place] and [place] for f/u that she will look into once she knows the plan with [facility]. She has several f/u appointments with specialist already noted in epic. Info provided to pt on primary care clinics that she can f/u with both in [place] including [place] clinics and also near [place].     Final next level of care: Home/Self Care Barriers to Discharge: No Barriers Identified   Patient Goals and CMS Choice Patient states their goals for this hospitalization and ongoing recovery are:: to get healthy      Discharge Placement            Home/ self care        Patient and family notified of of transfer: 06/09/19  Discharge Plan and Services In-house Referral: Clinical Social Work Discharge Planning Services: Medication Assistance, [facility]ATCH Program, [facility], Other - See comment Post Acute Care Choice: NA          DME Arranged: N/A DME Agency: NA       HH Arranged: NA HH Agency: NA        Social Determinants of Health (SDOH) Interventions     Readmission Risk Interventions Readmission Risk Prevention Plan 06/09/2019  Transportation Screening Complete  Medication  Review [facility]) Complete  PCP or Specialist appointment within 3-5 days of discharge Complete  HRI or Home Care Consult Complete  SW Recovery Care/Counseling Consult Complete  Palliative Care Screening Not Applicable  Skilled Nursing Facility Not Applicable

## 2019-06-09 NOTE — Progress Notes (Addendum)
      [address]       [place] [address]             [phone]      42 Days Post-Op Procedure(s) (LRB): AORTIC VALVE REPLACEMENT (AVR) using Edwards PERIMOUNT Magna Ease Pericardial Bioprosthesis - 29 MM Aortic Valve. (N/A) REPAIR OF TRICUSPID (N/A) TRANSESOPHAGEAL ECHOCARDIOGRAM (TEE) (N/A)   Subjective:  No new complaints.  Happy to be going home.  Objective: Vital signs in last 24 hours: Temp:  [97.8 F (36.6 C)-98.4 F (36.9 C)] 98.2 F (36.8 C) (06/01 0552) Pulse Rate:  [85-98] 85 (06/01 0552) Cardiac Rhythm: Normal sinus rhythm (05/31 1900) Resp:  [14-24] 16 (06/01 0552) BP: (79-139)/(50-79) 128/72 (06/01 0552) SpO2:  [95 %-100 %] 99 % (06/01 0022) Weight:  [84.7 kg] 84.7 kg (06/01 0552)  Intake/Output from previous day: 05/31 0701 - 06/01 0700 In: 440 [P.O.:440] Out: -   General appearance: alert, cooperative and no distress Heart: regular rate and rhythm Lungs: clear to auscultation bilaterally Abdomen: soft, non-tender; bowel sounds normal; no masses,  no organomegaly Extremities: extremities normal, atraumatic, no cyanosis or edema Wound: well healed  Lab Results: Recent Labs    06/08/19 0500  WBC 4.5  HGB 10.1*  HCT 33.4*  PLT 283   BMET:  Recent Labs    06/08/19 0527  NA 140  K 4.3  CL 109  CO2 26  GLUCOSE 88  BUN 21*  CREATININE 0.73  CALCIUM 8.6*    PT/INR: No results for input(s): LABPROT, INR in the last 72 hours. ABG    Component Value Date/Time   PHART 7.365 04/28/2019 2205   HCO3 24.5 04/28/2019 2205   TCO2 26 04/28/2019 2205   ACIDBASEDEF 1.0 04/28/2019 2205   O2SAT 97.0 04/28/2019 2205   CBG (last 3)  No results for input(s): GLUCAP in the last 72 hours.  Assessment/Plan: S/P Procedure(s) (LRB): AORTIC VALVE REPLACEMENT (AVR) using Edwards PERIMOUNT Magna Ease Pericardial Bioprosthesis - 29 MM Aortic Valve. (N/A) REPAIR OF TRICUSPID (N/A) TRANSESOPHAGEAL ECHOCARDIOGRAM (TEE) (N/A)  1. CV-  hemodynamically stable 2. Dispo- patient stable for discharge from CT surgery standpoint, she is off narcotic pain medications, ABX have been completed, PICC line will need removed if it hasn't been already.. D/C per primary team   LOS: 69 days    Erin Barrett, PA-C 06/09/2019  Patient home today after 69 days - no murmur  Follow up echo in cardiology office   Patient given this information : Endocarditis Information  You may be at risk for developing endocarditis since you have  an artificial heart valve  or a repaired heart valve. Endocarditis is an infection of the lining of the heart or heart valves.   Certain surgical and dental procedures may put you at risk,  such as teeth cleaning or other dental procedures or any surgery involving the respiratory, urinary, gastrointestinal tract, gallbladder or prostate.   Notify your doctor or dentist before having any invasive procedures. You will need to take antibiotics before certain procedures.   To prevent endocarditis, maintain good oral health. Seek prompt medical attention for any mouth/gum, skin or urinary tract infections.  I have seen and examined Lauren Reyes and agree with the above assessment  and plan.  Delight Ovens MD Beeper [phone] Office [phone] 06/09/2019 1:10 PM

## 2019-06-09 NOTE — Progress Notes (Signed)
Discharge instructions given to patient. PICC line removed by IV team per order. Medications reviewed, all questions answered. Pt escorted home with mother.  Hazle Nordmann, RN

## 2019-06-09 NOTE — Progress Notes (Signed)
CARDIAC REHAB PHASE I   PRE:  Rate/Rhythm: 101 ST       MODE:  Ambulation: 1800 ft   POST:  Rate/Rhythm: 113 ST  BP:  Supine:   Sitting: 120/76  Standing:   0950-1010 Pt happy to be going home.  Walked 1800 ft on RA with steady gait.  Pt knows to continue to walk for exercise and the importance of taking her meds.    Luetta Nutting, RN BSN  06/09/2019 10:09 AM

## 2019-06-09 NOTE — TOC Initial Note (Signed)
Transition of Care [facility]) - Initial/Assessment Note    Patient Details  Name: Lauren Reyes MRN: [id number] Date of Birth: 26-Jul-1990  Transition [facility] Care [facility]) CM/SW Contact:    Mearl Latin, LCSW Phone Number: 06/09/2019, 11:44 AM  Clinical Narrative:                 CSW Spoke with patient regarding her discharge plan and resource needs. Patient reported she is happy to be going home and states she will follow up with [facility]. She declined other resource needs. She did request a letter for court verifying that she has been in the hospital. CSW provided letter for patient. No other needs identified.   Expected Discharge Plan: Home/Self Care Barriers to Discharge: No Barriers Identified   Patient Goals and CMS Choice        Expected Discharge Plan and Services Expected Discharge Plan: Home/Self Care In-house Referral: Clinical Social Work Discharge Planning Services: CM Consult     Expected Discharge Date: 06/09/19                                    Prior Living Arrangements/Services   Lives with:: Significant Other Patient language and need for interpreter reviewed:: Yes        Need for Family Participation in Patient Care: Yes (Comment) Care giver support system in place?: Yes (comment)   Criminal Activity/Legal Involvement Pertinent to Current Situation/Hospitalization: No - Comment as needed  Activities of Daily Living Home Assistive Devices/Equipment: None ADL Screening (condition at time of admission) Patient's cognitive ability adequate to safely complete daily activities?: No Is the patient deaf or have difficulty hearing?: No Does the patient have difficulty seeing, even when wearing glasses/contacts?: No Does the patient have difficulty concentrating, remembering, or making decisions?: No Patient able to express need for assistance with ADLs?: Yes Does the patient have difficulty dressing or bathing?: No Independently performs ADLs?:  Yes (appropriate for developmental age) Does the patient have difficulty walking or climbing stairs?: No Weakness of Legs: Both Weakness of Arms/Hands: None  Permission Sought/Granted                  Emotional Assessment Appearance:: Appears stated age     Orientation: : Oriented to Self, Oriented to Place, Oriented to  Time, Oriented to Situation Alcohol / Substance Use: Illicit Drugs    Admission diagnosis:  Stroke [facility]) [I63.9] Acute ischemic left MCA stroke [facility]) [I63.512] Middle cerebral artery embolism, left [I66.02] Endocarditis [I38] Patient Active Problem List   Diagnosis Date Noted  . Endocarditis 04/28/2019  . Tachypnea   . Normocytic anemia 04/07/2019  . Polysubstance abuse (HCC) 04/07/2019  . IVDU (intravenous drug user) 04/07/2019  . Cerebrovascular accident (CVA) due to embolism of precerebral artery (HCC)   . Infective endocarditis   . DVT (deep venous thrombosis) (HCC)   . Encounter for central line placement   . Endotracheal tube present   . Acute ischemic left MCA stroke (HCC) 04/01/2019  . Middle cerebral artery embolism, left 04/01/2019   PCP:  Patient, No Pcp Per Pharmacy:   [facility] [place], [place] - [address] [place] [address] Phone: [phone] Fax: [phone]     Social Determinants of Health (SDOH) Interventions    Readmission Risk Interventions No flowsheet data found.

## 2019-06-09 NOTE — Discharge Summary (Signed)
Physician Discharge Summary  Lauren Reyes QJJ:[id number] DOB: 03/07/1990 DOA: 04/01/2019  PCP: Patient, No Pcp Per  Admit date: 04/01/2019 Discharge date: 06/10/2019  Admitted From: Home Disposition: Home  Recommendations for Outpatient Follow-up:  1. Follow ups as below. 2. Please obtain CBC/BMP/Mag at follow up 3. Please follow up on the following pending results: None  Home Health: None Equipment/Devices: None  Discharge Condition: Stable CODE STATUS: Full code  Follow-up Information    Luanne Bras, MD Follow up in 4 week(s).   Specialties: Interventional Radiology, Radiology Why: Please follow-up with Dr. Estanislado Pandy in clinic 4 weeks after discharge. Our office will call you to set up this appointment. Contact information: [place] [address] [phone]        Garvin Fila, MD. Schedule an appointment as soon as possible for a visit in 4 week(s).   Specialties: Neurology, Radiology Contact information: [address] [place] [address] [phone]        Aletta Edouard, MD Follow up in 1 month(s).   Specialties: Interventional Radiology, Radiology Why: Please follow-up with Dr. Kathlene Cote in clinic 1-2 months after discharge to discuss IVCF removal. Our office will call you to set up this appointment. Contact information: [place] [address] [phone]        Grace Isaac, MD Follow up.   Specialty: Cardiothoracic Surgery Why: Appointment is on 07/02/2019 at 10:00am. Please arrive at 9:30am for a chest xray located at [place] [facility] which is on the first floor of our building.  Contact information: [place] [address] [phone]           Hospital Course: 29 year old female with past medical history of substance abuse with heroin, crystal meth and cocaine who presented to the ER with aphasia and right-sided weakness and had a  code stroke subsequently called. She was admitted to the neurology ICU on 04/01/2019. She was intubated and underwent thrombectomy for left MCA M3 occlusion and recanalization. This was unfortunately complicated by dissection and pseudoaneurysm of the left ICA, s/p stent placement. She was extubated on 04/04/2019. She was found to have a right common femoral vein DVT along with right groin hematoma which was treated with IVC filter placement. Patient had a TEE which showed infective endocarditis and CT surgery was consulted. She is status post bioprosthetic aortic valve replacement and tricuspid valve repair on 04/28/2019 and is on IV antibiotics. CT surgery on board, requested [place] takeover service on 5/6. Completed 6 weeks IV antibiotics with ceftriaxone and vancomycin inpatient on 5/31 and discharged home in stable condition.  Outpatient follow-up with CTS, IR, Dr. Estanislado Pandy and ID as above.  See individual problem list below for more hospital course.  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 -s/p AV replacement and TV repair on 4/26 by Dr. Servando Snare -Completed 6 weeks of IV Rocephin and vancomycin on 06/08/2019 and discharged on 6/1. -Continue p.o. Lasix 20 mg daily and K-Dur 20 mEq daily -Outpatient follow-up with ID on 06/22/2019. -Outpatient follow-up with CTS on 07/02/2019.  Acute left MCA embolic CVA s/p thrombectomy with revascularization on 3/24 by IR Left ICA dissection/pseudoaneurysm s/p stent placement-thought to be complication -On aspirin and Plavix for a total of 3 to 6 months -Outpatient follow-up with Dr. Estanislado Pandy in clinic 4 wks after discharge -Outpatient follow-up with neurology as above  Right common femoral DVT with right groin hematoma s/p  IVC filter placement 04/03/2019 -Not a candidate for [facility] given septic emboli, dissection and hematoma as well as dual antiplatelets -IR to arrange outpatient follow-up in 1  to 2 months after discharge for IVC filter   ABLA/IDA/B12 and folate deficiency: Iron sat 9%.  H&H stable -IV Feraheme on 5/31 -Continue p.o. multivitamin with iron.  Sinus tachycardia -Discharged on metoprolol 12.5 mg twice daily  Polysubstance abuse: THC, amphetamine and suspected IVDU -Counseled and provided with resources.  Anxiety: Stable  -Continue BuSpar, Lexapro and Seroquel  Hypotension:  Resolved.    Nutrition Problem: Increased nutrient needs Etiology: acute illness  Signs/Symptoms: estimated needs  Interventions: Ensure Enlive (each supplement provides 350kcal and 20 grams of protein)      Discharge Exam: Vitals:   06/09/19 0855 06/09/19 1125  BP: (!) 89/64 94/63  Pulse: 78 91  Resp: 18 18  Temp: 97.6 F (36.4 C) (!) 97.3 F (36.3 C)  SpO2: 95% 100%    GENERAL: No apparent distress.  Nontoxic. HEENT: MMM.  Vision and hearing grossly intact.  NECK: Supple.  No apparent JVD.  RESP:  No IWOB.  Fair aeration bilaterally. CVS:  RRR. Heart sounds normal.  ABD/GI/GU: Bowel sounds present. Soft. Non tender.  MSK/EXT:  Moves extremities. No apparent deformity. No edema.  SKIN: no apparent skin lesion or wound NEURO: Awake, alert and oriented appropriately.  No apparent focal neuro deficit. PSYCH: Calm. Normal affect.  Discharge Instructions  Discharge Instructions    (HEART FAILURE PATIENTS) Call MD:  Anytime you have any of the following symptoms: 1) 3 pound weight gain in 24 hours or 5 pounds in 1 week 2) shortness of breath, with or without a dry hacking cough 3) swelling in the hands, feet or stomach 4) if you have to sleep on extra pillows at night in order to breathe.   Complete by: As directed    Amb Referral to Cardiac Rehabilitation   Complete by: As directed    Diagnosis: Valve Replacement   Valve: Aortic Comment - repair tricuspid regurg   After initial evaluation and assessments completed: Virtual Based Care may be provided alone or in  conjunction with Phase 2 Cardiac Rehab based on patient barriers.: Yes   Ambulatory referral to Neurology   Complete by: As directed    Follow up with Dr. Pearlean Brownie at [facility] in 4-6 weeks. Too complicated for RN to follow. Thanks.   Call MD for:  difficulty breathing, headache or visual disturbances   Complete by: As directed    Call MD for:  extreme fatigue   Complete by: As directed    Call MD for:  persistant dizziness or light-headedness   Complete by: As directed    Call MD for:  persistant nausea and vomiting   Complete by: As directed    Call MD for:  severe uncontrolled pain   Complete by: As directed    Call MD for:  temperature >100.4   Complete by: As directed    Diet - low sodium heart healthy   Complete by: As directed    Discharge instructions   Complete by: As directed    It has been a pleasure taking care of you! You were hospitalized with a stroke due to blockage of blood vessel in your brain that are surgically treated.  You also had damage to blood vessels in your neck that was treated with stent.  You had IVC placed for blood clot in the right leg.  You also had damage to  your heart birthdate infection which was surgically treated.  You have been started on new medications during this hospitalization.  It is very important that you take your medications as prescribed.  Carefully review your new medication list and the instructions on how to take them as there could be some changes during this hospitalization.  Please go to your follow-up appointments or call to schedule as recommended.   Take care,   Increase activity slowly   Complete by: As directed      Allergies as of 06/09/2019   No Known Allergies     Medication List    TAKE these medications   aspirin 81 MG EC tablet Take 1 tablet (81 mg total) by mouth daily.   busPIRone 7.5 MG tablet Commonly known as: BUSPAR Take 1 tablet (7.5 mg total) by mouth 2 (two) times daily.   clopidogrel 75 MG  tablet Commonly known as: PLAVIX Take 1 tablet (75 mg total) by mouth daily.   escitalopram 10 MG tablet Commonly known as: LEXAPRO Take 1 tablet (10 mg total) by mouth daily.   ferrous fumarate-b12-vitamic C-folic acid capsule Commonly known as: TRINSICON / FOLTRIN Take 1 capsule by mouth 2 (two) times daily after a meal.   furosemide 20 MG tablet Commonly known as: LASIX Take 1 tablet (20 mg total) by mouth daily.   metoprolol tartrate 25 MG tablet Commonly known as: LOPRESSOR Take 0.5 tablets (12.5 mg total) by mouth 2 (two) times daily.   potassium chloride SA 20 MEQ tablet Commonly known as: KLOR-CON Take 1 tablet (20 mEq total) by mouth daily.       Consultations: Cardiothoracic surgery IR and neuro interventional radiology Infectious disease Cardiology  Procedures/Studies: 3/24-left MCA thrombectomy by IR 4/20-AV replacement and TV repair by CTS  TTE on 04/02/2019 1. Normal LV function; moderate AI; mild MR and TR.  2. Left ventricular ejection fraction, by estimation, is 55 to 60%. The  left ventricle has normal function. The left ventricle has no regional  wall motion abnormalities. Left ventricular diastolic parameters were  normal.  3. Right ventricular systolic function is normal. The right ventricular  size is normal. There is mildly elevated pulmonary artery systolic  pressure.  4. The mitral valve is normal in structure. Mild mitral valve  regurgitation. No evidence of mitral stenosis.  5. The aortic valve is tricuspid. Aortic valve regurgitation is moderate.  Mild aortic valve sclerosis is present, with no evidence of aortic valve  stenosis.  6. The inferior vena cava is normal in size with greater than 50%  respiratory variability, suggesting right atrial pressure of 3 mmHg.   TEE on 04/07/2019 1. Left ventricular ejection fraction, by estimation, is 55 to 60%. The  left ventricle has normal function. The left ventricle has no regional   wall motion abnormalities.  2. Right ventricular systolic function is normal. The right ventricular  size is normal. There is moderately elevated pulmonary artery systolic  pressure.  3. No left atrial/left atrial appendage thrombus was detected.  4. Echodense small vegetation seen on atrial surface at A2-P2 leaflet  junction. . The mitral valve is abnormal. Mild mitral valve regurgitation.  No evidence of mitral stenosis.  5. Large mobile echodense structure projecting into right atrium.  Measures >2 cm in length. . The tricuspid valve is abnormal. Tricuspid  valve regurgitation is mild to moderate.  6. Moderately thickened. Abnormal structure, appears functionally  bicuspid vs. Tricuspid with partially fused leaflet. Irregular thickened  leaflet structure suggestive of  endocarditis. Severe aortic regurgitation,  with the flow filling the entire LVOT.Marland Kitchen  The aortic valve has an indeterminant number of cusps. Aortic valve  regurgitation is severe.  7. Agitated saline contrast bubble study was positive with shunting  observed after >6 cardiac cycles suggestive of intrapulmonary shunting.   TTE on 04/23/2019 1. No vegetation is seen on the aortic and mitral valves.  2. There appears to be a highly mobile echodensity in the RV attached to  the tricuspid valve, its only seen on images 108 and 110, size can't be  assessed. There is also highly mobile echodensity attached to the  posterior right atrial wall, this can  represent a vegetation or a thrombus. If clinically indication, a repeat  TEE is recommended.  3. Left ventricular ejection fraction, by estimation, is 55 to 60%. The  left ventricle has normal function. The left ventricle has no regional  wall motion abnormalities. The left ventricular internal cavity size was  mildly dilated. Left ventricular  diastolic function could not be evaluated.  4. Right ventricular systolic function is normal. The right ventricular  size  is normal. There is moderately elevated pulmonary artery systolic  pressure. The estimated right ventricular systolic pressure is 53.7 mmHg.  5. Left atrial size was mildly dilated.  6. The mitral valve is normal in structure. Moderate to severe mitral  valve regurgitation. No evidence of mitral stenosis.  7. Tricuspid valve regurgitation is severe.  8. The aortic valve is bicuspid. Aortic valve regurgitation is severe. No  aortic stenosis is present.  9. The inferior vena cava is normal in size with greater than 50%  respiratory variability, suggesting right atrial pressure of 3 mmHg.   Intraoperative TEE on 04/28/2019 . Left ventricle: Normal wall thickness. Cavity is mildly dilated.  . Left atrium: Cavity is dilated and dilated. Systolic blunting of  pulmonary venous inflow. Left atrial appendage filling and emptying  velocities are decreased.  . Aortic valve: Mild valve thickening present. Mild valve calcification  present. Mild stenosis. Severe regurgitation with a centrally directed  jet.  . Mitral valve: Mild mitral annular calcification. Moderate  regurgitation.  . Right ventricle: Normal cavity size, wall thickness and ejection  fraction.  . Tricuspid valve: Severe regurgitation. The tricuspid valve  regurgitation jet is directed toward septum.  Marland Kitchen Pericardium: There is a left pleural effusion.  . Left pleural effusion present.  [place] EKG SITE RITE  Result Date: 05/13/2019 If Site Rite image not attached, placement could not be confirmed due to current cardiac rhythm.      The results of significant diagnostics from this hospitalization (including imaging, microbiology, ancillary and laboratory) are listed below for reference.     Microbiology: No results found for this or any previous visit (from the past 240 hour(s)).   Labs: BNP (last 3 results) Recent Labs    04/15/19 2044  BNP 590.1*   Basic Metabolic Panel: Recent Labs  Lab 06/03/19 0643  06/08/19 0527  NA 137 140  K 4.3 4.3  CL 106 109  CO2 23 26  GLUCOSE 91 88  BUN 21* 21*  CREATININE 0.64 0.73  CALCIUM 8.6* 8.6*   Liver Function Tests: No results for input(s): AST, ALT, ALKPHOS, BILITOT, PROT, ALBUMIN in the last 168 hours. No results for input(s): LIPASE, AMYLASE in the last 168 hours. No results for input(s): AMMONIA in the last 168 hours. CBC: Recent Labs  Lab 06/08/19 0500  WBC 4.5  NEUTROABS 1.0*  HGB 10.1*  HCT 33.4*  MCV 87.4  PLT 283   Cardiac Enzymes: No results for input(s): CKTOTAL, CKMB, CKMBINDEX, TROPONINI in the last 168 hours. BNP: Invalid input(s): POCBNP CBG: No results for input(s): GLUCAP in the last 168 hours. D-Dimer No results for input(s): DDIMER in the last 72 hours. Hgb A1c No results for input(s): HGBA1C in the last 72 hours. Lipid Profile No results for input(s): CHOL, HDL, LDLCALC, TRIG, CHOLHDL, LDLDIRECT in the last 72 hours. Thyroid function studies No results for input(s): TSH, T4TOTAL, T3FREE, THYROIDAB in the last 72 hours.  Invalid input(s): FREET3 Anemia work up Recent Labs    06/08/19 0800  VITAMINB12 323  FOLATE 24.9  FERRITIN 38  TIBC 372  IRON 32  RETICCTPCT 1.0   Urinalysis No results found for: COLORURINE, APPEARANCEUR, LABSPEC, PHURINE, GLUCOSEU, HGBUR, BILIRUBINUR, KETONESUR, PROTEINUR, UROBILINOGEN, NITRITE, LEUKOCYTESUR Sepsis Labs Invalid input(s): PROCALCITONIN,  WBC,  LACTICIDVEN   Time coordinating discharge: 45 minutes  SIGNED:  Almon Herculesaye T Darrick Greenlaw, MD  [facility] 06/10/2019, 12:11 AM  If 7PM-7AM, please contact night-coverage [email or web address] Password [email or web address]

## 2019-06-11 ENCOUNTER — Telehealth (HOSPITAL_COMMUNITY): Payer: Self-pay

## 2019-06-11 NOTE — Telephone Encounter (Signed)
Called to schedule f/u, phone number in system not working. AW

## 2019-06-22 ENCOUNTER — Ambulatory Visit: Payer: Self-pay | Admitting: Internal Medicine

## 2019-06-24 ENCOUNTER — Telehealth (HOSPITAL_COMMUNITY): Payer: Self-pay

## 2019-06-24 NOTE — Telephone Encounter (Signed)
Called to schedule f/u, no answer, no vm. AW 

## 2019-06-29 ENCOUNTER — Telehealth (HOSPITAL_COMMUNITY): Payer: Self-pay

## 2019-06-29 NOTE — Telephone Encounter (Signed)
Called to schedule follow-up, no answer, left vm. AW  

## 2019-07-01 ENCOUNTER — Other Ambulatory Visit: Payer: Self-pay | Admitting: Cardiothoracic Surgery

## 2019-07-01 ENCOUNTER — Other Ambulatory Visit: Payer: Self-pay | Admitting: Interventional Radiology

## 2019-07-01 ENCOUNTER — Other Ambulatory Visit (HOSPITAL_COMMUNITY): Payer: Self-pay | Admitting: Student

## 2019-07-01 DIAGNOSIS — I82409 Acute embolism and thrombosis of unspecified deep veins of unspecified lower extremity: Secondary | ICD-10-CM

## 2019-07-01 DIAGNOSIS — Z952 Presence of prosthetic heart valve: Secondary | ICD-10-CM

## 2019-07-02 ENCOUNTER — Ambulatory Visit: Payer: Self-pay | Admitting: Cardiothoracic Surgery

## 2019-07-02 NOTE — Progress Notes (Deleted)
[address]       [place] [address]             [place] Record #[id number] Date of Birth: Jan 06, 1991  Referring: Mercy Riding, MD Primary Care: Patient, No Pcp Per Primary Cardiologist: No primary care provider on file.   Chief Complaint:   POST OP FOLLOW UP DATE OF PROCEDURE:  04/28/2019 PREOPERATIVE DIAGNOSIS:  Aortic and tricuspid endocarditis with acute stroke. POSTOPERATIVE DIAGNOSIS:  Aortic and tricuspid endocarditis with acute stroke. SURGICAL PROCEDURE:  Aortic valve replacement with pericardial tissue valve, model 3300 TFX 19 mm [employer] serial [id number], and debridement and repair of tricuspid regurgitation.  History of Present Illness:          Past Medical History:  Diagnosis Date  . IV drug user   . Polysubstance abuse ([place])   . Tobacco use      Social History   Tobacco Use  Smoking Status Never Smoker  Smokeless Tobacco Never Used    Social History   Substance and Sexual Activity  Alcohol Use None     No Known Allergies  Current Outpatient Medications  Medication Sig Dispense Refill  . aspirin EC 81 MG EC tablet Take 1 tablet (81 mg total) by mouth daily. 90 tablet 1  . busPIRone (BUSPAR) 7.5 MG tablet Take 1 tablet (7.5 mg total) by mouth 2 (two) times daily. 180 tablet 1  . clopidogrel (PLAVIX) 75 MG tablet Take 1 tablet (75 mg total) by mouth daily. 90 tablet 1  . escitalopram (LEXAPRO) 10 MG tablet Take 1 tablet (10 mg total) by mouth daily. 90 tablet 1  . ferrous DGLOVFIE-P32-[id number] C-folic acid (TRINSICON / FOLTRIN) capsule Take 1 capsule by mouth 2 (two) times daily after a meal. 90 capsule 1  . furosemide (LASIX) 20 MG tablet Take 1 tablet (20 mg total) by mouth daily. 30 tablet 1  . metoprolol tartrate (LOPRESSOR) 25 MG tablet Take 0.5 tablets (12.5 mg total) by mouth 2 (two) times daily. 45 tablet 2  . potassium chloride SA (KLOR-CON) 20 MEQ  tablet Take 1 tablet (20 mEq total) by mouth daily. 30 tablet 1   No current facility-administered medications for this visit.       Physical Exam: There were no vitals taken for this visit.  {Physical YSAY:3016010}   Diagnostic Studies & Laboratory data:     Recent Radiology Findings:   No results found.    Recent Lab Findings: Lab Results  Component Value Date   WBC 4.5 06/08/2019   HGB 10.1 (L) 06/08/2019   HCT 33.4 (L) 06/08/2019   PLT 283 06/08/2019   GLUCOSE 88 06/08/2019   CHOL 163 04/02/2019   TRIG 258 (H) 04/02/2019   HDL <10 (L) 04/02/2019   LDLDIRECT 42.9 04/03/2019   LDLCALC NOT CALCULATED 04/02/2019   ALT 19 05/28/2019   AST 21 05/28/2019   NA 140 06/08/2019   K 4.3 06/08/2019   CL 109 06/08/2019   CREATININE 0.73 06/08/2019   BUN 21 (H) 06/08/2019   CO2 26 06/08/2019   TSH 0.879 04/02/2019   INR 1.4 (H) 04/28/2019   HGBA1C 5.9 (H) 04/02/2019      Assessment / Plan:        Medication Changes: No orders of the defined types were placed in this encounter.     Grace Isaac MD      [address] [place] [address] Office [phone]     07/02/2019 9:43 AM

## 2019-07-07 ENCOUNTER — Ambulatory Visit (HOSPITAL_COMMUNITY): Payer: MEDICAID

## 2019-07-08 ENCOUNTER — Ambulatory Visit (INDEPENDENT_AMBULATORY_CARE_PROVIDER_SITE_OTHER): Payer: Self-pay | Admitting: Cardiothoracic Surgery

## 2019-07-08 ENCOUNTER — Ambulatory Visit
Admission: RE | Admit: 2019-07-08 | Discharge: 2019-07-08 | Disposition: A | Discharge status: Home / Self Care | Payer: Medicaid Other | Source: Ambulatory Visit | Attending: Cardiothoracic Surgery | Admitting: Cardiothoracic Surgery

## 2019-07-08 ENCOUNTER — Other Ambulatory Visit: Payer: Self-pay

## 2019-07-08 VITALS — BP 105/74 | HR 83 | Temp 97.7°F | Resp 20 | Ht 66.0 in | Wt 182.0 lb

## 2019-07-08 DIAGNOSIS — Z9889 Other specified postprocedural states: Secondary | ICD-10-CM

## 2019-07-08 DIAGNOSIS — Z952 Presence of prosthetic heart valve: Secondary | ICD-10-CM

## 2019-07-08 DIAGNOSIS — I330 Acute and subacute infective endocarditis: Secondary | ICD-10-CM

## 2019-07-08 NOTE — Progress Notes (Signed)
[address]       [place] [address]             [phone]      Lauren Reyes [place] Medical Record #[id number] Date of Birth: [birth year]  Referring: Almon Hercules, MD Primary Care: Patient, No Pcp Per Primary Cardiologist: No primary care provider on file.   Chief Complaint:   POST OP FOLLOW UP DATE OF PROCEDURE:  04/28/2019 PREOPERATIVE DIAGNOSIS:  Aortic and tricuspid endocarditis with acute stroke. POSTOPERATIVE DIAGNOSIS:  Aortic and tricuspid endocarditis with acute stroke. SURGICAL PROCEDURE:  Aortic valve replacement with pericardial tissue valve, model 3300 TFX 19 mm [employer] serial #[id number], and debridement and repair of tricuspid regurgitation  History of Present Illness:      Patient returns to the office today is her first hospital visit after a long and complicated hospitalization.  Which include presentation with acute stroke, placement of carotid stent, placement of caval filter for DVT inability to anticoagulate, ultimately TEE was done showing severe aortic insufficiency and endocarditis.  On Gertrude 20 she underwent aortic valve replacement and tricuspid valve repair for endocarditis.  She remained in the hospital until June 1 to complete 6 weeks of postop antibiotics.  Since discharge home she has been doing relatively well notes she is gaining strength ambulating well, no symptoms of heart failure no history of fever or chills.,    Past Medical History:  Diagnosis Date  . IV drug user   . Polysubstance abuse ([facility])   . Tobacco use      Social History   Tobacco Use  Smoking Status Never Smoker  Smokeless Tobacco Never Used    Social History   Substance and Sexual Activity  Alcohol Use None     No Known Allergies  Current Outpatient Medications  Medication Sig Dispense Refill  . aspirin EC 81 MG EC tablet Take 1 tablet (81 mg total) by mouth daily. 90 tablet 1  . busPIRone (BUSPAR) 7.5 MG tablet  Take 1 tablet (7.5 mg total) by mouth 2 (two) times daily. 180 tablet 1  . clopidogrel (PLAVIX) 75 MG tablet Take 1 tablet (75 mg total) by mouth daily. 90 tablet 1  . escitalopram (LEXAPRO) 10 MG tablet Take 1 tablet (10 mg total) by mouth daily. 90 tablet 1  . ferrous fumarate-b12-vitamic C-folic acid (TRINSICON / FOLTRIN) capsule Take 1 capsule by mouth 2 (two) times daily after a meal. 90 capsule 1  . furosemide (LASIX) 20 MG tablet Take 1 tablet (20 mg total) by mouth daily. 30 tablet 1  . metoprolol tartrate (LOPRESSOR) 25 MG tablet Take 0.5 tablets (12.5 mg total) by mouth 2 (two) times daily. 45 tablet 2  . potassium chloride SA (KLOR-CON) 20 MEQ tablet Take 1 tablet (20 mEq total) by mouth daily. 30 tablet 1   No current facility-administered medications for this visit.       Physical Exam: BP 105/74 (BP Location: Right Arm, Patient Position: Sitting, Cuff Size: Normal)   Pulse 83   Temp 97.7 F (36.5 C) (Temporal)   Resp 20   Ht 5\' 6"  (1.676 m)   Wt 182 lb (82.6 kg)   SpO2 99% Comment: RA  BMI 29.38 kg/m   General appearance: alert, cooperative and no distress Neurologic: intact Heart: regular rate and rhythm, S1, S2 normal, no murmur, click, rub or gallop Lungs: clear to auscultation bilaterally Abdomen: soft, non-tender; bowel sounds normal; no masses,  no organomegaly  Extremities: extremities normal, atraumatic, no cyanosis or edema and Homans sign is negative, no sign of DVT Wound: Sternum is stable well-healed   Diagnostic Studies & Laboratory data:     Recent Radiology Findings:   DG Chest 2 View  Result Date: 07/08/2019 CLINICAL DATA:  Status post AVR EXAM: CHEST - 2 VIEW COMPARISON:  05/08/2019 FINDINGS: Postsurgical changes are seen and stable. Cardiac shadow is within normal limits. The lungs are well aerated bilaterally. No focal infiltrate is seen. Previously noted linear density in the right base has resolved. No other focal abnormality is seen.  Right-sided PICC line has been removed as well. IMPRESSION: Postsurgical change without acute abnormality. Electronically Signed   By: Alcide Clever M.D.   On: 07/08/2019 16:36      Recent Lab Findings: Lab Results  Component Value Date   WBC 4.5 06/08/2019   HGB 10.1 (L) 06/08/2019   HCT 33.4 (L) 06/08/2019   PLT 283 06/08/2019   GLUCOSE 88 06/08/2019   CHOL 163 04/02/2019   TRIG 258 (H) 04/02/2019   HDL <10 (L) 04/02/2019   LDLDIRECT 42.9 04/03/2019   LDLCALC NOT CALCULATED 04/02/2019   ALT 19 05/28/2019   AST 21 05/28/2019   NA 140 06/08/2019   K 4.3 06/08/2019   CL 109 06/08/2019   CREATININE 0.73 06/08/2019   BUN 21 (H) 06/08/2019   CO2 26 06/08/2019   TSH 0.879 04/02/2019   INR 1.4 (H) 04/28/2019   HGBA1C 5.9 (H) 04/02/2019      Assessment / Plan:   #1 postop after replacement of aortic valve with a tissue valve and repair of tricuspid valve for endocarditis-completed 6 weeks of postsurgical IV antibiotics #2 original presentation with acute stroke treated by neuro interventional radiology-we will arrange follow-up with neurology #3 since surgery the patient has had no follow-up with cardiology will arrange is also that she will need to be followed long-term #4 caval filter was placed and was scheduled to be removed 6 weeks after discharge from the hospital-she will recontact interventional radiology to make sure this is scheduled   Plan to see the patient back in 1 month  Medication Changes: No orders of the defined types were placed in this encounter.     Delight Ovens MD      [address] [place] [place] [address] Office [phone]     07/09/2019 5:55 PM

## 2019-07-08 NOTE — Patient Instructions (Signed)

## 2019-08-10 DIAGNOSIS — Z736 Limitation of activities due to disability: Secondary | ICD-10-CM

## 2019-08-20 ENCOUNTER — Ambulatory Visit: Payer: Medicaid Other | Admitting: Cardiothoracic Surgery

## 2019-08-26 ENCOUNTER — Telehealth (HOSPITAL_COMMUNITY): Payer: Self-pay

## 2019-08-26 ENCOUNTER — Ambulatory Visit
Admission: RE | Admit: 2019-08-26 | Discharge: 2019-08-26 | Disposition: A | Discharge status: Home / Self Care | Payer: Medicaid Other | Source: Ambulatory Visit | Attending: Student | Admitting: Student

## 2019-08-26 ENCOUNTER — Other Ambulatory Visit (HOSPITAL_COMMUNITY): Payer: Self-pay | Admitting: Interventional Radiology

## 2019-08-26 ENCOUNTER — Ambulatory Visit
Admission: RE | Admit: 2019-08-26 | Discharge: 2019-08-26 | Disposition: A | Discharge status: Home / Self Care | Payer: Medicaid Other | Source: Ambulatory Visit | Attending: Interventional Radiology | Admitting: Interventional Radiology

## 2019-08-26 ENCOUNTER — Encounter: Payer: Self-pay | Admitting: Radiology

## 2019-08-26 DIAGNOSIS — Z95828 Presence of other vascular implants and grafts: Secondary | ICD-10-CM

## 2019-08-26 DIAGNOSIS — I82409 Acute embolism and thrombosis of unspecified deep veins of unspecified lower extremity: Secondary | ICD-10-CM

## 2019-08-26 HISTORY — PX: IR RADIOLOGIST EVAL & MGMT: IMG5224

## 2019-08-26 NOTE — Progress Notes (Signed)
Chief Complaint: Patient was seen in follow-up today for IVC filter retrieval.  History of Present Illness: Lauren Reyes is a 29 y.o. female who presented with an acute left hemispheric stroke on 04/01/2019 and underwent revascularization with endovascular retrieval of thrombus and placement of a pipeline stent in the distal left cervical ICA.  During hospitalization she developed a DVT of the right common femoral vein and underwent placement of an IVC filter on 04/03/2019 due to contraindication to anticoagulation at that time.  During hospital admission she developed respiratory symptoms with imaging demonstrating evidence of septic emboli in both lungs.  Cardiac evaluation demonstrated evidence of endocarditis with valvular vegetations and severe aortic regurgitation.  She underwent aortic valve replacement and tricuspid valve debridement and repair on 05/04/2019 by Dr. Servando Snare.  Since that time she has completed 6 weeks of IV antibiotic therapy and has made a good recovery.  She still has some speech difficulty and some difficulty with articulation but no weakness.  She is able to ambulate normally.  She does have some fatigue with longer periods of walking.  She denies any shortness of breath, chest pain or lower extremity edema.  Past Medical History:  Diagnosis Date   Acute ischemic left MCA stroke ([place]) 04/01/2019   Cerebrovascular accident (CVA) due to embolism of precerebral artery (HCC)    DVT (deep venous thrombosis) ([place])    Encounter for central line placement    Endocarditis 04/28/2019   Endotracheal tube present    Infective endocarditis    IV drug user    IVDU (intravenous drug user) 04/07/2019   Middle cerebral artery embolism, left 04/01/2019   Normocytic anemia 04/07/2019   Polysubstance abuse ([place])    Tachypnea    Tobacco use     Past Surgical History:  Procedure Laterality Date   AORTIC VALVE REPLACEMENT N/A 04/28/2019   Procedure: AORTIC  VALVE REPLACEMENT (AVR) using Edwards PERIMOUNT Magna Ease Pericardial Bioprosthesis - 29 MM Aortic Valve.;  Surgeon: Grace Isaac, MD;  Location: [place];  Service: Open Heart Surgery;  Laterality: N/A;   BUBBLE STUDY  04/07/2019   Procedure: BUBBLE STUDY;  Surgeon: Buford Dresser, MD;  Location: [place];  Service: Cardiovascular;;   IR CT HEAD LTD  04/01/2019   IR INTRAVSC STENT CERV CAROTID W/O EMB-PROT MOD SED INC ANGIO  04/01/2019   IR IVC FILTER PLMT / S&I /IMG GUID/MOD SED  04/03/2019   IR PERCUTANEOUS ART THROMBECTOMY/INFUSION INTRACRANIAL INC DIAG ANGIO  04/01/2019   RADIOLOGY WITH ANESTHESIA N/A 04/01/2019   Procedure: IR WITH ANESTHESIA;  Surgeon: Luanne Bras, MD;  Location: [place];  Service: Radiology;  Laterality: N/A;   TEE WITHOUT CARDIOVERSION N/A 04/07/2019   Procedure: TRANSESOPHAGEAL ECHOCARDIOGRAM (TEE);  Surgeon: Buford Dresser, MD;  Location: [facility] ENDOSCOPY;  Service: Cardiovascular;  Laterality: N/A;   TEE WITHOUT CARDIOVERSION N/A 04/28/2019   Procedure: TRANSESOPHAGEAL ECHOCARDIOGRAM (TEE);  Surgeon: Grace Isaac, MD;  Location: [place];  Service: Open Heart Surgery;  Laterality: N/A;   TRICUSPID VALVE REPLACEMENT N/A 04/28/2019   Procedure: REPAIR OF TRICUSPID;  Surgeon: Grace Isaac, MD;  Location: [place];  Service: Open Heart Surgery;  Laterality: N/A;    Allergies: Patient has no known allergies.  Medications: Prior to Admission medications   Medication Sig Start Date End Date Taking? Authorizing Provider  aspirin EC 81 MG EC tablet Take 1 tablet (81 mg total) by mouth daily. 06/09/19   Mercy Riding, MD  busPIRone (BUSPAR) 7.5 MG  tablet Take 1 tablet (7.5 mg total) by mouth 2 (two) times daily. 06/09/19   Mercy Riding, MD  clopidogrel (PLAVIX) 75 MG tablet Take 1 tablet (75 mg total) by mouth daily. 06/09/19   Mercy Riding, MD  escitalopram (LEXAPRO) 10 MG tablet Take 1 tablet (10 mg total) by mouth daily. 06/09/19   Mercy Riding, MD  ferrous [id number] C-folic acid (TRINSICON / FOLTRIN) capsule Take 1 capsule by mouth 2 (two) times daily after a meal. 06/09/19   Gonfa, Charlesetta Ivory, MD  furosemide (LASIX) 20 MG tablet Take 1 tablet (20 mg total) by mouth daily. 06/09/19   Mercy Riding, MD  metoprolol tartrate (LOPRESSOR) 25 MG tablet Take 0.5 tablets (12.5 mg total) by mouth 2 (two) times daily. 06/09/19 10/22/19  Mercy Riding, MD  potassium chloride SA (KLOR-CON) 20 MEQ tablet Take 1 tablet (20 mEq total) by mouth daily. 06/09/19   Mercy Riding, MD     Family History  Problem Relation Age of Onset   Heart murmur Brother    CAD Neg Hx    Heart failure Neg Hx     Social History   Socioeconomic History   Marital status: Single    Spouse name: Not on file   Number of children: Not on file   Years of education: Not on file   Highest education level: Not on file  Occupational History   Not on file  Tobacco Use   Smoking status: Never Smoker   Smokeless tobacco: Never Used  Substance and Sexual Activity   Alcohol use: Not on file   Drug use: Not on file   Sexual activity: Not on file  Other Topics Concern   Not on file  Social History Narrative   Not on file   Social Determinants of Health   Financial Resource Strain:    Difficulty of Paying Living Expenses:   Food Insecurity:    Worried About [place] in the Last Year:    Human resources officer of Food in the Last Year:   Transportation Needs:    Film/video editor (Medical):    Lack of Transportation (Non-Medical):   Physical Activity:    Days of Exercise per Week:    Minutes of Exercise per Session:   Stress:    Feeling of Stress :   Social Connections:    Frequency of Communication with Friends and Family:    Frequency of Social Gatherings with Friends and Family:    Attends Religious Services:    Active Member of Clubs or Organizations:    Attends Archivist Meetings:    Marital Status:       Review of Systems: A 12 point ROS discussed and pertinent positives are indicated in the HPI above.  All other systems are negative.  Review of Systems  Constitutional: Negative.   Respiratory: Negative.   Cardiovascular: Negative.   Gastrointestinal: Negative.   Genitourinary: Negative.   Musculoskeletal: Negative.   Neurological: Positive for speech difficulty. Negative for dizziness, syncope, weakness, light-headedness, numbness and headaches.    Vital Signs: There were no vitals taken for this visit.  Physical Exam Vitals reviewed.  Constitutional:      General: She is not in acute distress.    Appearance: She is not ill-appearing, toxic-appearing or diaphoretic.  Cardiovascular:     Rate and Rhythm: Normal rate and regular rhythm.     Heart sounds: Normal heart sounds. No murmur heard.  No  gallop.   Pulmonary:     Effort: Pulmonary effort is normal. No respiratory distress.     Breath sounds: Normal breath sounds. No stridor. No wheezing, rhonchi or rales.  Musculoskeletal:        General: No swelling.  Skin:    General: Skin is warm and dry.  Neurological:     General: No focal deficit present.     Mental Status: She is alert and oriented to person, place, and time.     Imaging: US Venous Img Lower Bilateral (DVT)  Result Date: 08/26/2019 CLINICAL DATA:  History of right common femoral vein DVT in March during hospitalization and status post IVC filter placement due to contraindication to anticoagulation at that time secondary to acute stroke. Evaluation now performed to determine whether the IVC filter can be removed electively. Status post aortic valve replacement and tricuspid valve repair for endocarditis on 04/28/2019. EXAM: BILATERAL LOWER EXTREMITY VENOUS DOPPLER ULTRASOUND TECHNIQUE: Gray-scale sonography with graded compression, as well as color Doppler and duplex ultrasound were performed to evaluate the lower extremity deep venous systems from the  level of the common femoral vein and including the common femoral, femoral, profunda femoral, popliteal and calf veins including the posterior tibial, peroneal and gastrocnemius veins when visible. The superficial great saphenous vein was also interrogated. Spectral Doppler was utilized to evaluate flow at rest and with distal augmentation maneuvers in the common femoral, femoral and popliteal veins. COMPARISON:  None. FINDINGS: RIGHT LOWER EXTREMITY Common Femoral Vein: No evidence of thrombus. Normal compressibility, respiratory phasicity and response to augmentation. Saphenofemoral Junction: No evidence of thrombus. Normal compressibility and flow on color Doppler imaging. Profunda Femoral Vein: No evidence of thrombus. Normal compressibility and flow on color Doppler imaging. Femoral Vein: No evidence of thrombus. Normal compressibility, respiratory phasicity and response to augmentation. Popliteal Vein: No evidence of thrombus. Normal compressibility, respiratory phasicity and response to augmentation. Calf Veins: No evidence of thrombus. Normal compressibility and flow on color Doppler imaging. Superficial Great Saphenous Vein: No evidence of thrombus. Normal compressibility. Venous Reflux:  None. Other Findings: No evidence of superficial thrombophlebitis or abnormal fluid collection. LEFT LOWER EXTREMITY Common Femoral Vein: No evidence of thrombus. Normal compressibility, respiratory phasicity and response to augmentation. Saphenofemoral Junction: No evidence of thrombus. Normal compressibility and flow on color Doppler imaging. Profunda Femoral Vein: No evidence of thrombus. Normal compressibility and flow on color Doppler imaging. Femoral Vein: No evidence of thrombus. Normal compressibility, respiratory phasicity and response to augmentation. Popliteal Vein: No evidence of thrombus. Normal compressibility, respiratory phasicity and response to augmentation. Calf Veins: No evidence of thrombus. Normal  compressibility and flow on color Doppler imaging. Superficial Great Saphenous Vein: No evidence of thrombus. Normal compressibility. Venous Reflux:  None. Other Findings: No evidence of superficial thrombophlebitis or abnormal fluid collection. Waveforms at the level of bilateral deep veins demonstrates somewhat pulsatile flow which may be reflection of some elevation of right heart pressures. IMPRESSION: No evidence of deep venous thrombosis in either lower extremity. Pulsatile deep vein waveforms bilaterally may reflect elevated right heart pressures. Electronically Signed   By: Aletta Edouard M.D.   On: 08/26/2019 11:11   IR Radiologist Eval & Mgmt  Result Date: 08/26/2019 Please refer to notes tab for details about interventional procedure. (Op Note)   Labs:  CBC: Recent Labs    05/20/19 0504 05/25/19 0500 05/28/19 0500 06/08/19 0500  WBC 3.9* 4.6 4.5 4.5  HGB 8.7* 9.5* 9.6* 10.1*  HCT 29.0* 31.3* 31.6* 33.4*  PLT  429* 364 325 283    COAGS: Recent Labs    04/01/19 1943 04/27/19 0730 04/28/19 1500  INR 1.5* 1.3* 1.4*  APTT 31  --  29    BMP: Recent Labs    05/27/19 0445 05/28/19 0500 06/03/19 0643 06/08/19 0527  NA 137 136 137 140  K 4.3 4.6 4.3 4.3  CL 107 106 106 109  CO2 _0 GLUCOSE 86 83 91 88  BUN 21* 22* 21* 21*  CALCIUM 8.7* 8.5* 8.6* 8.6*  CREATININE 0.64 0.60 0.64 0.73  GFRNONAA >60 >60 >60 >60  GFRAA >60 >60 >60 >60    LIVER FUNCTION TESTS: Recent Labs    05/01/19 0412 05/02/19 0340 05/25/19 0500 05/28/19 0500  BILITOT 0.3 0.7 0.3 0.3  AST _1 ALT _2 ALKPHOS 82 74 67 73  PROT 5.4* 5.0* 5.8* 6.1*  ALBUMIN 2.3* 2.2* 2.7* 2.8*     Assessment and Plan:  I met with Ms. Behnken and her mother. Bilateral lower extremity venous duplex ultrasound was performed today demonstrating no evidence of acute or chronic DVT in either lower extremity. Previously identified right common femoral vein DVT has completely  resolved. She has no lower extremity edema or symptoms.  We discussed removing the retrievable IVC filter which currently is not serving any purpose. Her risk of recurrent DVT is fairly low now that she has recovered from her stroke and valvular surgery and is ambulating. I recommended that we electively remove the IVC filter. She is agreeable and we will begin the scheduling process to remove the IVC filter at [facility] on an outpatient basis. She has upcoming follow-up appointments with Dr. Servando Snare and Dr. Radford Pax.   Electronically Signed: Azzie Roup 08/26/2019, 11:13 AM     I spent a total of 15 Minutes in face to face in clinical consultation, greater than 50% of which was counseling/coordinating care for IVC filter removal.

## 2019-08-26 NOTE — Telephone Encounter (Signed)
Called to schedule ivc filter removal, no answer. AW

## 2019-08-27 ENCOUNTER — Other Ambulatory Visit: Payer: Self-pay

## 2019-08-27 ENCOUNTER — Ambulatory Visit (INDEPENDENT_AMBULATORY_CARE_PROVIDER_SITE_OTHER): Payer: Medicaid Other | Admitting: Cardiothoracic Surgery

## 2019-08-27 VITALS — BP 115/82 | HR 102 | Temp 96.3°F | Resp 20 | Ht 67.0 in | Wt 188.0 lb

## 2019-08-27 DIAGNOSIS — I330 Acute and subacute infective endocarditis: Secondary | ICD-10-CM | POA: Diagnosis not present

## 2019-08-27 DIAGNOSIS — Z952 Presence of prosthetic heart valve: Secondary | ICD-10-CM

## 2019-08-27 DIAGNOSIS — Z9889 Other specified postprocedural states: Secondary | ICD-10-CM | POA: Diagnosis not present

## 2019-08-27 NOTE — Progress Notes (Signed)
[address]       [place] [address]             [phone]      Dreyah Wanjiku Reily Rudolph Medical Record #[id number] Date of Birth: 12-Jul-1990  Referring: Almon Hercules, MD Primary Care: Patient, No Pcp Per Primary Cardiologist: Armanda Magic, MD   Chief Complaint:   POST OP FOLLOW UP DATE OF PROCEDURE:  04/28/2019 PREOPERATIVE DIAGNOSIS:  Aortic and tricuspid endocarditis with acute stroke. POSTOPERATIVE DIAGNOSIS:  Aortic and tricuspid endocarditis with acute stroke. SURGICAL PROCEDURE:  Aortic valve replacement with pericardial tissue valve, model 3300 TFX 19 mm Edwards [employer] serial #[id number], and debridement and repair of tricuspid regurgitation  History of Present Illness:      Patient returns to the office today for follow-up visit after  a long and complicated hospitalization.  Which include presentation with acute stroke, placement of carotid stent, placement of caval filter for DVT inability to anticoagulate, ultimately TEE was done showing severe aortic insufficiency and endocarditis.  On Ryka 20 she underwent aortic valve replacement and tricuspid valve repair for endocarditis.  She remained in the hospital until June 1 to complete 6 weeks of postop antibiotics.    She continues to increase her physical activity appropriately, denies any relapses of fever chills night sweats or other signs or symptoms of infection.  She denies any ongoing drug use She was seen by interventional radiology in follow-up after hospitalization for removal of her caval filter-this is to be done in the next week or 2  Past Medical History:  Diagnosis Date  . Acute ischemic left MCA stroke (HCC) 04/01/2019  . Cerebrovascular accident (CVA) due to embolism of precerebral artery (HCC)   . DVT (deep venous thrombosis) (HCC)   . Encounter for central line placement   . Endocarditis 04/28/2019  . Endotracheal tube present   . Infective endocarditis   . IV drug  user   . IVDU (intravenous drug user) 04/07/2019  . Middle cerebral artery embolism, left 04/01/2019  . Normocytic anemia 04/07/2019  . Polysubstance abuse (HCC)   . Tachypnea   . Tobacco use      Social History   Tobacco Use  Smoking Status Never Smoker  Smokeless Tobacco Never Used    Social History   Substance and Sexual Activity  Alcohol Use None     No Known Allergies  Current Outpatient Medications  Medication Sig Dispense Refill  . aspirin EC 81 MG EC tablet Take 1 tablet (81 mg total) by mouth daily. 90 tablet 1  . busPIRone (BUSPAR) 7.5 MG tablet Take 1 tablet (7.5 mg total) by mouth 2 (two) times daily. 180 tablet 1  . clopidogrel (PLAVIX) 75 MG tablet Take 1 tablet (75 mg total) by mouth daily. 90 tablet 1  . escitalopram (LEXAPRO) 10 MG tablet Take 1 tablet (10 mg total) by mouth daily. 90 tablet 1  . ferrous fumarate-b12-vitamic C-folic acid (TRINSICON / FOLTRIN) capsule Take 1 capsule by mouth 2 (two) times daily after a meal. 90 capsule 1  . furosemide (LASIX) 20 MG tablet Take 1 tablet (20 mg total) by mouth daily. 30 tablet 1  . metoprolol tartrate (LOPRESSOR) 25 MG tablet Take 0.5 tablets (12.5 mg total) by mouth 2 (two) times daily. 45 tablet 2  . potassium chloride SA (KLOR-CON) 20 MEQ tablet Take 1 tablet (20 mEq total) by mouth daily. 30 tablet 1   No current facility-administered medications for this  visit.       Physical Exam: BP 115/82 (BP Location: Right Arm, Patient Position: Sitting, Cuff Size: Normal)   Pulse (!) 102   Temp (!) 96.3 F (35.7 C) (Temporal)   Resp 20   Ht 5\' 7"  (1.702 m)   Wt 188 lb (85.3 kg)   SpO2 96% Comment: RA  BMI 29.44 kg/m  General appearance: alert, cooperative and no distress Head: Normocephalic, without obvious abnormality, atraumatic Neck: no adenopathy, no carotid bruit, no JVD, supple, symmetrical, trachea midline and thyroid not enlarged, symmetric, no tenderness/mass/nodules Lymph nodes: Cervical,  supraclavicular, and axillary nodes normal. Resp: clear to auscultation bilaterally Cardio: regular rate and rhythm, S1, S2 normal, no murmur, click, rub or gallop Extremities: extremities normal, atraumatic, no cyanosis or edema Neurologic: Grossly normal Sternum stable and well-healed   Diagnostic Studies & Laboratory data:     Recent Radiology Findings:  Venous Img Lower Bilateral (DVT)  Result Date: 08/26/2019 CLINICAL DATA:  History of right common femoral vein DVT in March during hospitalization and status post IVC filter placement due to contraindication to anticoagulation at that time secondary to acute stroke. Evaluation now performed to determine whether the IVC filter can be removed electively. Status post aortic valve replacement and tricuspid valve repair for endocarditis on 04/28/2019. EXAM: BILATERAL LOWER EXTREMITY VENOUS DOPPLER ULTRASOUND TECHNIQUE: Gray-scale sonography with graded compression, as well as color Doppler and duplex ultrasound were performed to evaluate the lower extremity deep venous systems from the level of the common femoral vein and including the common femoral, femoral, profunda femoral, popliteal and calf veins including the posterior tibial, peroneal and gastrocnemius veins when visible. The superficial great saphenous vein was also interrogated. Spectral Doppler was utilized to evaluate flow at rest and with distal augmentation maneuvers in the common femoral, femoral and popliteal veins. COMPARISON:  None. FINDINGS: RIGHT LOWER EXTREMITY Common Femoral Vein: No evidence of thrombus. Normal compressibility, respiratory phasicity and response to augmentation. Saphenofemoral Junction: No evidence of thrombus. Normal compressibility and flow on color Doppler imaging. Profunda Femoral Vein: No evidence of thrombus. Normal compressibility and flow on color Doppler imaging. Femoral Vein: No evidence of thrombus. Normal compressibility, respiratory phasicity and  response to augmentation. Popliteal Vein: No evidence of thrombus. Normal compressibility, respiratory phasicity and response to augmentation. Calf Veins: No evidence of thrombus. Normal compressibility and flow on color Doppler imaging. Superficial Great Saphenous Vein: No evidence of thrombus. Normal compressibility. Venous Reflux:  None. Other Findings: No evidence of superficial thrombophlebitis or abnormal fluid collection. LEFT LOWER EXTREMITY Common Femoral Vein: No evidence of thrombus. Normal compressibility, respiratory phasicity and response to augmentation. Saphenofemoral Junction: No evidence of thrombus. Normal compressibility and flow on color Doppler imaging. Profunda Femoral Vein: No evidence of thrombus. Normal compressibility and flow on color Doppler imaging. Femoral Vein: No evidence of thrombus. Normal compressibility, respiratory phasicity and response to augmentation. Popliteal Vein: No evidence of thrombus. Normal compressibility, respiratory phasicity and response to augmentation. Calf Veins: No evidence of thrombus. Normal compressibility and flow on color Doppler imaging. Superficial Great Saphenous Vein: No evidence of thrombus. Normal compressibility. Venous Reflux:  None. Other Findings: No evidence of superficial thrombophlebitis or abnormal fluid collection. Waveforms at the level of bilateral deep veins demonstrates somewhat pulsatile flow which may be reflection of some elevation of right heart pressures. IMPRESSION: No evidence of deep venous thrombosis in either lower extremity. Pulsatile deep vein waveforms bilaterally may reflect elevated right heart pressures. Electronically Signed   By: 04/30/2019.D.  On: 08/26/2019 11:11   IR Radiologist Eval & Mgmt  Result Date: 08/26/2019 Please refer to notes tab for details about interventional procedure. (Op Note)    Recent Lab Findings: Lab Results  Component Value Date   WBC 4.5 06/08/2019   HGB 10.1 (L) 06/08/2019     HCT 33.4 (L) 06/08/2019   PLT 283 06/08/2019   GLUCOSE 88 06/08/2019   CHOL 163 04/02/2019   TRIG 258 (H) 04/02/2019   HDL <10 (L) 04/02/2019   LDLDIRECT 42.9 04/03/2019   LDLCALC NOT CALCULATED 04/02/2019   ALT 19 05/28/2019   AST 21 05/28/2019   NA 140 06/08/2019   K 4.3 06/08/2019   CL 109 06/08/2019   CREATININE 0.73 06/08/2019   BUN 21 (H) 06/08/2019   CO2 26 06/08/2019   TSH 0.879 04/02/2019   INR 1.4 (H) 04/28/2019   HGBA1C 5.9 (H) 04/02/2019      Assessment / Plan:   #1 postop after replacement of aortic valve with a tissue valve and repair of tricuspid valve for endocarditis-completed 6 weeks of postsurgical IV antibiotics #2 original presentation with acute stroke treated by neuro interventional radiology-we will arrange follow-up with neurology #3 since surgery the patient has had no follow-up with cardiology will arrange is also that she will need to be followed long-term #4 caval filter was placed and was scheduled to be removed 6 weeks after discharge from the hospital-she will recontact interventional radiology to make sure this is scheduled   Plan to see the patient back in 1 month  Medication Changes: No orders of the defined types were placed in this encounter.     Delight Ovens MD      [address] [place] [place] [address] Office [phone]     08/28/2019 1:44 PM

## 2019-08-28 NOTE — Progress Notes (Signed)
Cardiology Progress Note    Date:  08/31/2019   ID:  Lauren Reyes, Travieso 01-24-1990, MRN [id number]  PCP:  Patient, No Pcp Per  Cardiologist:  Fransico Him, MD   Chief Complaint  Patient presents with  . Follow-up    Endocarditis,  DVT, AVR and TV repair    History of Present Illness:  Lauren Reyes is a 29 y.o. female with a hx of polysubstance abuse Heroin, crystal met, cocaine and tobacco abuse.  Recently admitted with acute CVA felt secondary to septic emboli from IV drug abuse with renal, splenic and septic pulmonary infarcts.  She was found to have a right common femoral vein DVT along with right groin hematoma which was treated with IVC filter placement. She underwent a left common carotid arteriofem with complete revascularization of the occluded M3 branch occlusion.  TEE was done which revealed normal LVF with moderate pulmonary HTN, small vegetation on the A2-P2 leaflet junction with mild MR, large mobile density projecting into the RA >2cm in length off the TV with mild to moderate TR, functionally bicuspid AV with possible vegetation and severe AI with flow jet filling the entire LVOT.  There was also evidence of shunting after >6 cardiac cycles suggestive of intrapulmonary shunting.   She was evaluated by ID and started on Antibx.  Also evaluated by CVTS and felt to not be a surgical candidate at this time as she has no evidence of CHF and would need to be able to be off Brilinta for 7 days as well as IV durg abuse and evaluation from Psych prior to considering surgery.  She ultimately underwent AVR with 15m [employer] pericardial tissue valve as well as debridement and repair of TV.   She was discharged 06/10/2019. She was seen back by Dr. GServando Snarewith CVTS on 8/19 and was doing well.  She was referred back to interventional radiology for removal of IVC filter.   She is here today for followup and is doing well.  She denies any chest pain or pressure,  PND, orthopnea, LE edema, dizziness, palpitations or syncope. She has completed cardiac rehab.  She still has some DOE when she walks outside or if she has to do any exertional activities.  She is walking on her cul-de-sac and walks about 1/2 mile but has to take her time or she has DOE. She is compliant with her meds and is tolerating meds with no SE.   Past Medical History:  Diagnosis Date  . Acute ischemic left MCA stroke ([facility] 04/01/2019  . Cerebrovascular accident (CVA) due to embolism of precerebral artery ([facility]   . DVT (deep venous thrombosis) ([facility]   . Encounter for central line placement   . Endocarditis 04/28/2019  . Endotracheal tube present   . Infective endocarditis   . IV drug user   . IVDU (intravenous drug user) 04/07/2019  . Middle cerebral artery embolism, left 04/01/2019  . Normocytic anemia 04/07/2019  . Polysubstance abuse ([facility]   . Tachypnea   . Tobacco use     Past Surgical History:  Procedure Laterality Date  . AORTIC VALVE REPLACEMENT N/A 04/28/2019   Procedure: AORTIC VALVE REPLACEMENT (AVR) [facility] - 29 MM Aortic Valve.;  Surgeon: GGrace Isaac MD;  [place]  Service: Open Heart Surgery;  Laterality: N/A;  . BUBBLE STUDY  04/07/2019   Procedure: BUBBLE STUDY;  Surgeon: CBuford Dresser MD;  [facility];  Service: Cardiovascular;;  .  IR CT HEAD LTD  04/01/2019  . IR INTRAVSC STENT CERV CAROTID W/O EMB-PROT MOD SED INC ANGIO  04/01/2019  . IR IVC FILTER PLMT / S&I /IMG GUID/MOD SED  04/03/2019  . IR PERCUTANEOUS ART THROMBECTOMY/INFUSION INTRACRANIAL INC DIAG ANGIO  04/01/2019  . IR RADIOLOGIST EVAL & MGMT  08/26/2019  . RADIOLOGY WITH ANESTHESIA N/A 04/01/2019   Procedure: IR WITH ANESTHESIA;  Surgeon: Luanne Bras, MD;  Location: [place];  Service: Radiology;  Laterality: N/A;  . TEE WITHOUT CARDIOVERSION N/A 04/07/2019   Procedure: TRANSESOPHAGEAL ECHOCARDIOGRAM (TEE);  Surgeon:  Buford Dresser, MD;  Location[facility];  Service: Cardiovascular;  Laterality: N/A;  . TEE WITHOUT CARDIOVERSION N/A 04/28/2019   Procedure: TRANSESOPHAGEAL ECHOCARDIOGRAM (TEE);  Surgeon: Grace Isaac, MD;  Location: [place];  Service: Open Heart Surgery;  Laterality: N/A;  . TRICUSPID VALVE REPLACEMENT N/A 04/28/2019   Procedure: REPAIR OF TRICUSPID;  Surgeon: Grace Isaac, MD;  Location: [place];  Service: Open Heart Surgery;  Laterality: N/A;    Current Medications: Current Meds  Medication Sig  . aspirin EC 81 MG EC tablet Take 1 tablet (81 mg total) by mouth daily.  . busPIRone (BUSPAR) 7.5 MG tablet Take 1 tablet (7.5 mg total) by mouth 2 (two) times daily.  . clopidogrel (PLAVIX) 75 MG tablet Take 1 tablet (75 mg total) by mouth daily.  Marland Kitchen escitalopram (LEXAPRO) 10 MG tablet Take 1 tablet (10 mg total) by mouth daily.  . ferrous [id number] C-folic acid (TRINSICON / FOLTRIN) capsule Take 1 capsule by mouth 2 (two) times daily after a meal.  . furosemide (LASIX) 20 MG tablet Take 1 tablet (20 mg total) by mouth daily.  . metoprolol tartrate (LOPRESSOR) 25 MG tablet Take 0.5 tablets (12.5 mg total) by mouth 2 (two) times daily.  . potassium chloride SA (KLOR-CON) 20 MEQ tablet Take 1 tablet (20 mEq total) by mouth daily.    Allergies:   Patient has no known allergies.   Social History   Socioeconomic History  . Marital status: Single    Spouse name: Not on file  . Number of children: Not on file  . Years of education: Not on file  . Highest education level: Not on file  Occupational History  . Not on file  Tobacco Use  . Smoking status: Never Smoker  . Smokeless tobacco: Never Used  Substance and Sexual Activity  . Alcohol use: Not on file  . Drug use: Not on file  . Sexual activity: Not on file  Other Topics Concern  . Not on file  Social History Narrative  . Not on file   Social Determinants of Health   Financial Resource Strain:   .  Difficulty of Paying Living Expenses: Not on file  Food Insecurity:   . Worried About Charity fundraiser in the Last Year: Not on file  . Ran Out of Food in the Last Year: Not on file  Transportation Needs:   . Lack of Transportation (Medical): Not on file  . Lack of Transportation (Non-Medical): Not on file  Physical Activity:   . Days of Exercise per Week: Not on file  . Minutes of Exercise per Session: Not on file  Stress:   . Feeling of Stress : Not on file  Social Connections:   . Frequency of Communication with Friends and Family: Not on file  . Frequency of Social Gatherings with Friends and Family: Not on file  . Attends Religious Services: Not on  file  . Active Member of Clubs or Organizations: Not on file  . Attends Archivist Meetings: Not on file  . Marital Status: Not on file     Family History:  The patient's family history includes Heart murmur in her brother.   ROS:   Please see the history of present illness.    ROS All other systems reviewed and are negative.  No flowsheet data found.     PHYSICAL EXAM:   VS:  BP 118/62   Pulse (!) 101   Ht _0  (1.702 m)   Wt 190 lb (86.2 kg)   SpO2 98%   BMI 29.76 kg/m    GEN: Well nourished, well developed, in no acute distress  HEENT: normal  Neck: no JVD, carotid bruits, or masses Cardiac: RRR; no murmurs, rubs, or gallops,no edema.  Intact distal pulses bilaterally.  Respiratory:  clear to auscultation bilaterally, normal work of breathing GI: soft, nontender, nondistended, + BS MS: no deformity or atrophy  Skin: warm and dry, no rash Neuro:  Alert and Oriented x 3, Strength and sensation are intact Psych: euthymic mood, full affect  Wt Readings from Last 3 Encounters:  08/31/19 190 lb (86.2 kg)  08/27/19 188 lb (85.3 kg)  07/08/19 182 lb (82.6 kg)      Studies/Labs Reviewed:   EKG:  EKG is ordered today and showed NSR at 85bpm with LVH and repol abnormality  Recent Labs: 04/02/2019:  TSH 0.879 04/15/2019: B Natriuretic Peptide 590.1 05/20/2019: Magnesium 1.8 05/28/2019: ALT 19 06/08/2019: BUN 21; Creatinine, Ser 0.73; Hemoglobin 10.1; Platelets 283; Potassium 4.3; Sodium 140   Lipid Panel    Component Value Date/Time   CHOL 163 04/02/2019 0012   TRIG 258 (H) 04/02/2019 0012   HDL <10 (L) 04/02/2019 0012   CHOLHDL NOT CALCULATED 04/02/2019 0012   VLDL 52 (H) 04/02/2019 0012   LDLCALC NOT CALCULATED 04/02/2019 0012   LDLDIRECT 42.9 04/03/2019 0502    Additional studies/ records that were reviewed today include:  TEE, OR note, hospital discharge summary and outpt OV with CVTS    ASSESSMENT:    1. Endocarditis due to other organism, unspecified chronicity   2. Acute deep vein thrombosis (DVT) of lower extremity, unspecified laterality, unspecified vein (HCC)   3. Middle cerebral artery embolism, left      PLAN:  In order of problems listed above:  1.  Recent Acute endocarditis  -complicated by acute CVA, splenic infarcts and septic pulmonary emboli  -hx of polysubstance abuse with Heroin, crystal met, cocaine and tobacco abuse -TEE 04/2019 with normal LVF with moderate pulmonary HTN, small vegetation on the A2-P2 leaflet junction with mild MR, large mobile density projecting into the RA >2cm in length off the TV with mild to moderate TR, functionally bicuspid AV with possible vegetation and severe AI with flow jet filling the entire LVOT.  -s/p 24m [employer] pericardial tissue valve as well as debridement and repair of TV.   -doing well with no complaints -will get baseline 2D echo after AVR and TV repair -discussed long term SBE prophylaxis for dental procedures  2.  Recent DVT -s/p IVC filter -she has been referred back to IR for removal which will take place in a few weeks  3.  Recent CVA -secondary to acute endocarditis -continue ASA, Plavix   Medication Adjustments/Labs and Tests Ordered: Current medicines are reviewed at length with  the patient today.  Concerns regarding medicines are outlined above.  Medication changes, Labs  and Tests ordered today are listed in the Patient Instructions below.  There are no Patient Instructions on file for this visit.   Signed, Fransico Him, MD  08/31/2019 1:42 PM    [facility] [place], [place], [place]  [address] Phone: [phone]; Fax: [phone]

## 2019-08-31 ENCOUNTER — Encounter: Payer: Self-pay | Admitting: Cardiology

## 2019-08-31 ENCOUNTER — Other Ambulatory Visit: Payer: Self-pay

## 2019-08-31 ENCOUNTER — Ambulatory Visit (INDEPENDENT_AMBULATORY_CARE_PROVIDER_SITE_OTHER): Payer: Medicaid Other | Admitting: Cardiology

## 2019-08-31 VITALS — BP 118/62 | HR 101 | Ht 67.0 in | Wt 190.0 lb

## 2019-08-31 DIAGNOSIS — I82409 Acute embolism and thrombosis of unspecified deep veins of unspecified lower extremity: Secondary | ICD-10-CM | POA: Diagnosis not present

## 2019-08-31 DIAGNOSIS — I6602 Occlusion and stenosis of left middle cerebral artery: Secondary | ICD-10-CM

## 2019-08-31 DIAGNOSIS — I39 Endocarditis and heart valve disorders in diseases classified elsewhere: Secondary | ICD-10-CM | POA: Diagnosis not present

## 2019-08-31 NOTE — Patient Instructions (Signed)
Medication Instructions:  °Your physician recommends that you continue on your current medications as directed. Please refer to the Current Medication list given to you today. ° °*If you need a refill on your cardiac medications before your next appointment, please call your pharmacy* ° °Testing/Procedures: °Your physician has requested that you have an echocardiogram. Echocardiography is a painless test that uses sound waves to create images of your heart. It provides your doctor with information about the size and shape of your heart and how well your heart’s chambers and valves are working. This procedure takes approximately one hour. There are no restrictions for this procedure. ° °Follow-Up: °At [facility], you and your health needs are our priority.  As part of our continuing mission to provide you with exceptional heart care, we have created designated Provider Care Teams.  These Care Teams include your primary Cardiologist (physician) and Advanced Practice Providers (APPs -  Physician Assistants and Nurse Practitioners) who all work together to provide you with the care you need, when you need it. ° °We recommend signing up for the patient portal called "MyChart".  Sign up information is provided on this After Visit Summary.  MyChart is used to connect with patients for Virtual Visits (Telemedicine).  Patients are able to view lab/test results, encounter notes, upcoming appointments, etc.  Non-urgent messages can be sent to your provider as well.   °To learn more about what you can do with MyChart, go to [email or web address].   ° °Your next appointment:   °6 month(s) ° °The format for your next appointment:   °In Person ° °Provider:   °You may see Traci Turner, MD or one of the following Advanced Practice Providers on your designated Care Team:   °· Dayna Dunn, PA-C °· Michele Lenze, PA-C ° ° °

## 2019-09-02 ENCOUNTER — Other Ambulatory Visit: Payer: Self-pay

## 2019-09-02 NOTE — Patient Outreach (Signed)
[facility]) Care Management  09/02/2019  Kary Wanjiku Leyh [birth year] [id number]  Telephone outreach to patient to obtain mRS was successfully completed. MRS=1  Baruch Gouty [facility] Management Assistant [phone]

## 2019-09-08 ENCOUNTER — Telehealth (HOSPITAL_COMMUNITY): Payer: Self-pay

## 2019-09-08 NOTE — Telephone Encounter (Signed)
Called pt's mother to schedule ivc filter removal, no answer, left vm. AW

## 2019-09-08 NOTE — Telephone Encounter (Signed)
-----   Message from Irish Lack, MD sent at 09/08/2019  1:53 PM EDT ----- Regarding: RE: Schedule IVC Filter Retrieval She should stay on Plavix and aspirin.  GY  ----- Message ----- From: Anderson Malta Sent: 09/08/2019   1:45 PM EDT To: Irish Lack, MD Subject: FW: Schedule IVC Filter Retrieval              Yama,  Do you want her to hold her Plavix for this procedure?  Thanks, Fara Boros ----- Message ----- From: Estell Harpin Sent: 08/26/2019   3:39 PM EDT To: Christen Butter Subject: Schedule IVC Filter Retrieval                  Interventional Radiology Procedure Request  Name: Lauren Reyes June 18, 1990  Procedure: IVC Filter Retrieval  Reason for procedure: Presence of IVC Filter  Relevant History:In epic  Order Physician: Fredia Sorrow  Office Contact: Me Medicaid/no precert req outpatient Thank you

## 2019-09-11 ENCOUNTER — Other Ambulatory Visit (HOSPITAL_COMMUNITY): Payer: Medicaid Other

## 2019-09-24 ENCOUNTER — Other Ambulatory Visit: Payer: Self-pay | Admitting: Radiology

## 2019-09-25 ENCOUNTER — Ambulatory Visit (HOSPITAL_COMMUNITY): Payer: Medicaid Other | Attending: Cardiology

## 2019-09-25 ENCOUNTER — Other Ambulatory Visit: Payer: Self-pay

## 2019-09-25 ENCOUNTER — Encounter: Payer: Self-pay | Admitting: Cardiology

## 2019-09-25 DIAGNOSIS — I39 Endocarditis and heart valve disorders in diseases classified elsewhere: Secondary | ICD-10-CM | POA: Diagnosis not present

## 2019-09-25 LAB — ECHOCARDIOGRAM COMPLETE
AV Mean grad: 10 mmHg
AV Peak grad: 17.1 mmHg
Ao pk vel: 2.07 m/s
Area-P 1/2: 7.16 cm2
MV M vel: 4.15 m/s
MV Peak grad: 68.9 mmHg
S' Lateral: 4.8 cm

## 2019-09-25 NOTE — Progress Notes (Unsigned)
Patient ID: Lauren Reyes, female   DOB: 08/21/1990, 29 y.o.   MRN: [id number]   Patient's ejection fraction was reported as normal per study completed 04/28/2019. Today, patient's ejection fraction appears reduced. I consulted DOD-Dr. Clifton James. Per, Dr. Clifton James after meeting with the patient, okay to discharge Lauren Reyes.

## 2019-09-28 ENCOUNTER — Telehealth (HOSPITAL_COMMUNITY): Payer: Self-pay

## 2019-09-28 ENCOUNTER — Ambulatory Visit (HOSPITAL_COMMUNITY): Admission: RE | Admit: 2019-09-28 | Payer: MEDICAID | Source: Ambulatory Visit

## 2019-09-28 NOTE — Telephone Encounter (Signed)
Called Dr. Norris Cross office to find out why Lauren Reyes's IVC filter removal was canceled and if she could be rescheduled. Spoke to Dr. Norris Cross RN who stated that she had an echo done recently. They would like the patient to be seen by the Heart Failure [facility] before proceeding to get clearance.

## 2019-09-30 ENCOUNTER — Telehealth (HOSPITAL_COMMUNITY): Payer: Self-pay

## 2019-09-30 ENCOUNTER — Other Ambulatory Visit (HOSPITAL_COMMUNITY): Payer: Self-pay

## 2019-09-30 ENCOUNTER — Other Ambulatory Visit: Payer: Self-pay

## 2019-09-30 ENCOUNTER — Encounter (HOSPITAL_COMMUNITY): Payer: Self-pay | Admitting: Cardiology

## 2019-09-30 ENCOUNTER — Ambulatory Visit (HOSPITAL_COMMUNITY)
Admission: RE | Admit: 2019-09-30 | Discharge: 2019-09-30 | Disposition: A | Discharge status: Home / Self Care | Payer: Medicaid Other | Source: Ambulatory Visit | Attending: Cardiology | Admitting: Cardiology

## 2019-09-30 VITALS — BP 118/102 | HR 107 | Ht 67.0 in | Wt 209.2 lb

## 2019-09-30 DIAGNOSIS — Z953 Presence of xenogenic heart valve: Secondary | ICD-10-CM | POA: Diagnosis not present

## 2019-09-30 DIAGNOSIS — Z7982 Long term (current) use of aspirin: Secondary | ICD-10-CM | POA: Diagnosis not present

## 2019-09-30 DIAGNOSIS — Q231 Congenital insufficiency of aortic valve: Secondary | ICD-10-CM | POA: Diagnosis not present

## 2019-09-30 DIAGNOSIS — I514 Myocarditis, unspecified: Secondary | ICD-10-CM | POA: Insufficient documentation

## 2019-09-30 DIAGNOSIS — Z95828 Presence of other vascular implants and grafts: Secondary | ICD-10-CM | POA: Diagnosis not present

## 2019-09-30 DIAGNOSIS — Z8673 Personal history of transient ischemic attack (TIA), and cerebral infarction without residual deficits: Secondary | ICD-10-CM | POA: Insufficient documentation

## 2019-09-30 DIAGNOSIS — Z87891 Personal history of nicotine dependence: Secondary | ICD-10-CM | POA: Insufficient documentation

## 2019-09-30 DIAGNOSIS — I5082 Biventricular heart failure: Secondary | ICD-10-CM | POA: Insufficient documentation

## 2019-09-30 DIAGNOSIS — Z86718 Personal history of other venous thrombosis and embolism: Secondary | ICD-10-CM | POA: Diagnosis not present

## 2019-09-30 DIAGNOSIS — Z79899 Other long term (current) drug therapy: Secondary | ICD-10-CM | POA: Diagnosis not present

## 2019-09-30 DIAGNOSIS — Z7902 Long term (current) use of antithrombotics/antiplatelets: Secondary | ICD-10-CM | POA: Insufficient documentation

## 2019-09-30 DIAGNOSIS — F1911 Other psychoactive substance abuse, in remission: Secondary | ICD-10-CM | POA: Insufficient documentation

## 2019-09-30 DIAGNOSIS — Z8249 Family history of ischemic heart disease and other diseases of the circulatory system: Secondary | ICD-10-CM | POA: Diagnosis not present

## 2019-09-30 DIAGNOSIS — I5022 Chronic systolic (congestive) heart failure: Secondary | ICD-10-CM | POA: Diagnosis not present

## 2019-09-30 LAB — CBC
HCT: 48.8 % — ABNORMAL HIGH (ref 36.0–46.0)
Hemoglobin: 15.1 g/dL — ABNORMAL HIGH (ref 12.0–15.0)
MCH: 25.9 pg — ABNORMAL LOW (ref 26.0–34.0)
MCHC: 30.9 g/dL (ref 30.0–36.0)
MCV: 83.6 fL (ref 80.0–100.0)
Platelets: 365 10*3/uL (ref 150–400)
RBC: 5.84 MIL/uL — ABNORMAL HIGH (ref 3.87–5.11)
RDW: 17.2 % — ABNORMAL HIGH (ref 11.5–15.5)
WBC: 5.3 10*3/uL (ref 4.0–10.5)
nRBC: 0 % (ref 0.0–0.2)

## 2019-09-30 LAB — BASIC METABOLIC PANEL
Anion gap: 15 (ref 5–15)
BUN: 11 mg/dL (ref 6–20)
CO2: 15 mmol/L — ABNORMAL LOW (ref 22–32)
Calcium: 8.8 mg/dL — ABNORMAL LOW (ref 8.9–10.3)
Chloride: 107 mmol/L (ref 98–111)
Creatinine, Ser: 1.04 mg/dL — ABNORMAL HIGH (ref 0.44–1.00)
GFR calc Af Amer: >60 mL/min (ref >60)
GFR calc non Af Amer: >60 mL/min (ref >60)
Glucose, Bld: 102 mg/dL — ABNORMAL HIGH (ref 70–99)
Potassium: 4.4 mmol/L (ref 3.5–5.1)
Sodium: 137 mmol/L (ref 135–145)

## 2019-09-30 LAB — PROTIME-INR
INR: 1.5 — ABNORMAL HIGH (ref 0.8–1.2)
Prothrombin Time: 17.6 seconds — ABNORMAL HIGH (ref 11.4–15.2)

## 2019-09-30 LAB — BRAIN NATRIURETIC PEPTIDE: B Natriuretic Peptide: 2906.1 pg/mL — ABNORMAL HIGH (ref 0.0–100.0)

## 2019-09-30 MED ORDER — ENTRESTO 24-26 MG PO TABS: 1.0000 | ORAL_TABLET | Freq: Two times a day (BID) | ORAL | 11 refills | Status: DC

## 2019-09-30 MED ORDER — SPIRONOLACTONE 25 MG PO TABS: 12.5000 mg | ORAL_TABLET | Freq: Every day | ORAL | 3 refills | Status: DC

## 2019-09-30 MED ORDER — FUROSEMIDE 40 MG PO TABS: ORAL_TABLET | ORAL | 3 refills | Status: DC

## 2019-09-30 MED ORDER — DIGOXIN 125 MCG PO TABS: 0.1250 mg | ORAL_TABLET | Freq: Every day | ORAL | 3 refills | Status: DC

## 2019-09-30 NOTE — Telephone Encounter (Signed)
-----   Message from Laurey Morale, MD sent at 09/30/2019 12:55 PM EDT ----- Low HCO3 is concerning.  Very worried about low output HF.  Please have her add digoxin 0.125 mg daily to her regimen also.  If she does not start to feel significantly better with the changes made today, she will need admission for inpatient management.

## 2019-09-30 NOTE — H&P (View-Only) (Signed)
PCP: Patient, No Pcp Per Cardiology: Dr. Mayford Knife HF Cardiology: Dr. Shirlee Latch  29 y.o. with history of polysubstance abuse (cocaine, heroin, methamphetamines) and smoking, endocarditis, and prolonged hospitalization 3/21-6/21 was referred by Dr. Mayford Knife for evaluation of new onset cardiomyopathy.  Patient was admitted to [facility] in 3/21 with left MCA CVA. She had revascularization of the MCA by IR, procedure complicated by left carotid dissection treated with a stent. TEE showed endocarditis => large tricuspid regurgitation with mild to moderate TR, small MV vegetation with mild MR, and aortic valve vegetation (bicuspid valve) with severe AI.  Blood cultures remained negative, she was treated with empiric abx. She had septic emboli to the brain, kidneys, spleen, and lungs.  On 04/28/19, she had bioprosthetic AVR and debridement + repair of the tricuspid valve. She additionally had a right CFV DVT.  As she was not able to be anticoagulated with septic emboli to the brain as well as the carotid dissection, she had an IVC filter placed.  After a prolonged course, she was discharged in 6/21.  She is now living at home with her mother.  She has not smoked or used drugs since she was admitted in 3/21.    She initially made progress after discharge and started to get stronger.  However, she feels like she suffered a setback in 8/21 after developing a "cold," COVID-19 negative. Since then, she has become steadily more short of breath.  She can now only walk 2-3 minutes before having to stop due to dyspnea.  She thinks she has gained about 20 lbs in < 1 month. For the last few weeks, she has had both orthopnea and PND, hard to sleep.  Mild chest discomfort at times at her chest surgical site. She is "tired all the time."  No palpitations or syncope.   Echo was done in 9/21, this showed no vegetations but EF was down to 20-25% with severely decreased RV systolic function, moderate MR, moderate TR s/p TV repair, bioprosthetic  AoV functioning normally.  Of note, intra-op TEE at time of valve surgery showed normal EF. She did not have a post-op echo until 9/21. She has no history of cardiomyopathy or MI in her family.   ECG (personally reviewed): sinus tachy 100 bpm with inferolateral TWIs  Labs (5/21): creatinine 0.73  PMH: 1. Polysubstance abuse: Cocaine, heroin, methamphetamines.  None since 3/21. 2. Prior smoker: Stopped 3/21.  3. CVA: 3/21, septic embolus.  She had MCA revascularization by IR.  - Left ICA dissection as complication of procedure requiring stent placement.  4. Endocarditis: 3/21.  Culture negative.  She had septic emboli to brain, kidney, spleen and lungs.   - TEE (3/21): EF 55-60%, normal RV, small MV vegetation with mild MR, large TV vegetation with mild-moderate TR, bicuspid aortic valve with aortic valve endocarditis and severe AI.  - 04/28/19 she had bioprosthetic AVR and debridement + repair of tricuspid valve.  5. DVT (3/21): IVC filter => not candidate for anticoagulation given septic emboli, dissection of carotid with IR procedure initially.  6. Chronic systolic CHF: Echo in 9/21 showed EF 20-25%, global hypokinesis, severely decreased RV systolic function, moderate MR, moderate TR s/p TV repair, bioprosthetic aortic valve with mean gradient 10 mmHg and no regurgitation; no vegetation noted. Most recent prior echo was the TEE done at the time of surgery in 4/21, EF was normal then.   Social History   Socioeconomic History  . Marital status: Single    Spouse name: Not on file  .  Number of children: Not on file  . Years of education: Not on file  . Highest education level: Not on file  Occupational History  . Not on file  Tobacco Use  . Smoking status: Former Games developer  . Smokeless tobacco: Never Used  Substance and Sexual Activity  . Alcohol use: Not on file  . Drug use: Not on file  . Sexual activity: Not on file  Other Topics Concern  . Not on file  Social History Narrative  .  Not on file   Social Determinants of Health   Financial Resource Strain:   . Difficulty of Paying Living Expenses: Not on file  Food Insecurity:   . Worried About Programme researcher, broadcasting/film/video in the Last Year: Not on file  . Ran Out of Food in the Last Year: Not on file  Transportation Needs:   . Lack of Transportation (Medical): Not on file  . Lack of Transportation (Non-Medical): Not on file  Physical Activity:   . Days of Exercise per Week: Not on file  . Minutes of Exercise per Session: Not on file  Stress:   . Feeling of Stress : Not on file  Social Connections:   . Frequency of Communication with Friends and Family: Not on file  . Frequency of Social Gatherings with Friends and Family: Not on file  . Attends Religious Services: Not on file  . Active Member of Clubs or Organizations: Not on file  . Attends Banker Meetings: Not on file  . Marital Status: Not on file  Intimate Partner Violence:   . Fear of Current or Ex-Partner: Not on file  . Emotionally Abused: Not on file  . Physically Abused: Not on file  . Sexually Abused: Not on file   Family History  Problem Relation Age of Onset  . Heart murmur Brother   . CAD Neg Hx   . Heart failure Neg Hx    ROS: All systems reviewed and negative except as per HPI.   Current Outpatient Medications  Medication Sig Dispense Refill  . aspirin EC 81 MG EC tablet Take 1 tablet (81 mg total) by mouth daily. 90 tablet 1  . clopidogrel (PLAVIX) 75 MG tablet Take 1 tablet (75 mg total) by mouth daily. 90 tablet 1  . escitalopram (LEXAPRO) 10 MG tablet Take 1 tablet (10 mg total) by mouth daily. 90 tablet 1  . furosemide (LASIX) 40 MG tablet Take 1 tablet (40 mg total) by mouth in the morning AND 0.5 tablets (20 mg total) every evening. 60 tablet 3  . potassium chloride SA (KLOR-CON) 20 MEQ tablet Take 1 tablet (20 mEq total) by mouth daily. 30 tablet 1  . digoxin (LANOXIN) 0.125 MG tablet Take 1 tablet (0.125 mg total) by mouth  daily. 90 tablet 3  . ferrous fumarate-b12-vitamic C-folic acid (TRINSICON / FOLTRIN) capsule Take 1 capsule by mouth 2 (two) times daily after a meal. (Patient not taking: Reported on 09/30/2019) 90 capsule 1  . sacubitril-valsartan (ENTRESTO) 24-26 MG Take 1 tablet by mouth 2 (two) times daily. 60 tablet 11  . spironolactone (ALDACTONE) 25 MG tablet Take 0.5 tablets (12.5 mg total) by mouth daily. 45 tablet 3   No current facility-administered medications for this encounter.   BP (!) 118/102   Pulse (!) 107   Ht 5\' 7"  (1.702 m)   Wt 94.9 kg   SpO2 97%   BMI 32.77 kg/m  General: NAD Neck: JVP 12-14 cm,  no thyromegaly  or thyroid nodule.  Lungs: Clear to auscultation bilaterally with normal respiratory effort. CV: Lateral PMI.  Heart mildly tachy, regular S1/S2, no S3/S4, no murmur.  1+ edema 1/2 to knees.  No carotid bruit.  Normal pedal pulses.  Abdomen: Soft, nontender, no hepatosplenomegaly, mild distention.  Skin: Intact without lesions or rashes.  Neurologic: Alert and oriented x 3.  Psych: Normal affect. Extremities: No clubbing or cyanosis.  HEENT: Normal.   Assessment/Plan: 1. Chronic systolic CHF: 9/21 echo showed no vegetations but EF was down to 20-25% with severely decreased RV systolic function, moderate MR, moderate TR s/p TV repair, bioprosthetic AoV functioning normally.  Of note, intra-op TEE at time of valve surgery showed normal EF. She did not have a post-op echo until 9/21. Now with biventricular failure.  Cause is uncertain, ?viral myocarditis ("cold" in 8/21), ?LV damage from severe aortic insufficiency now more obvious s/p AVR (but would not explain severe RV dysfunction), ?CAD (prior smoker but young age with no family history). On exam, she is volume overloaded.  NYHA class III symptoms.  She has been taking Lasix 20 mg daily.  - With volume overload and concern for possible low output, will stop metoprolol tartrate (only on low dose).  - Start Entresto 24/16  bid.  - Start spironolactone 12.5 daily.  - Increase Lasix to 40 mg bid x 5 days then 40 qam/20 qpm.  Continue KCl 20 daily. BMET/BNP today and in 1 week.  - Cardiac MRI to assess for infiltrative disease.  - I am concerned about low output HF.  I will arrange for right and left heart cath next week, will assess filling pressures and cardiac output and will rule out coronary disease.  2. H/o endocarditis: Culture negative.  S/p bioprosthetic aortic valve replacement and TV repair.  No vegetation noted on 9/21 echo.  3. Bicuspid aortic valve disorder: Now s/p bioprosthetic AVR.  Thoracic aorta not dilated on 3/21 CT.  4. H/o R CFV DVT: Has IVC filter, think ok to remove at this point.  5. H/o CVA: Had left MCA revascularization by IR, complicated by left ICA dissection and stent placement.  - On ASA 81 + Plavix for stent.  6. Polysubstance abuse: No drugs, ETOH, or smoking since 3/21.   Marca Ancona 10/01/2019

## 2019-09-30 NOTE — Patient Instructions (Signed)
INCREASE Lasix to 40 mg twice a day for 5 days, then decrease to 40 mg in the AM amd 20 mg in the PM START Entresto 24/26 mg, one tab twice a day START Spironolactone 12.5 mg, one half tab daily STOP Metroprolol   Labs today We will only contact you if something comes back abnormal or we need to make some changes. Otherwise no news is good news!  Your physician has requested that you have a cardiac MRI. Cardiac MRI uses a computer to create images of your heart as its beating, producing both still and moving pictures of your heart and major blood vessels. For further information please visit [email or web address]. Please follow the instruction sheet given to you today for more information.    Your physician recommends that you schedule a follow-up appointment in: 3 weeks with Dr Shirlee Latch   If you have any questions or concerns before your next appointment please send [place] a message through [facility] or call our office at [phone].    TO LEAVE A MESSAGE FOR THE NURSE SELECT OPTION 2, PLEASE LEAVE A MESSAGE INCLUDING: . YOUR NAME . DATE OF BIRTH . CALL BACK NUMBER . REASON FOR CALL**this is important as we prioritize the call backs  YOU WILL RECEIVE A CALL BACK THE SAME DAY AS LONG AS YOU CALL BEFORE 4:00 PM       You are scheduled for a Cardiac Catheterization on Thursday, September 30 with Dr. Marca Ancona.  1. Please arrive at the [facility] (Main Entrance A) at [facility]: [address] [place], [place] [address] at 10:00 AM (This time is two hours before your procedure to ensure your preparation). Free valet parking service is available.   Special note: Every effort is made to have your procedure done on time. Please understand that emergencies sometimes delay scheduled procedures.  2. Diet: Do not eat solid foods after midnight.  The patient may have clear liquids until 5am upon the day of the procedure.  3. Labs: Pre procedure labs done 09/30/19 You will need a pre  procedure COVID test    WHEN: 10/05/19  anytime between 9am-3pm WHERE: COVID Test Site [address] [place], [place] [address]  This is a drive thru testing site, you will remain in your car. Be sure to get in the line FOR PROCEDURES Once you have been swabbed you will need to remain home in quarantine until you return for your procedure.   4. Medication instructions in preparation for your procedure:   Contrast Allergy: No   Stop taking, Lasix (Furosemide)  Thursday, September 30,   On the morning of your procedure, take your Aspirin and any morning medicines NOT listed above.  You may use sips of water.  5. Plan for one night stay--bring personal belongings. 6. Bring a current list of your medications and current insurance cards. 7. You MUST have a responsible person to drive you home. 8. Someone MUST be with you the first 24 hours after you arrive home or your discharge will be delayed. 9. Please wear clothes that are easy to get on and off and wear slip-on shoes.  Thank you for allowing [place] to care for you!   -- [facility]

## 2019-09-30 NOTE — Telephone Encounter (Signed)
Patients mom advised and verbalized understanding. Digoxin sent into pharmacy. Patient will call if symptoms fail to improve.   Meds ordered this encounter  Medications  . digoxin (LANOXIN) 0.125 MG tablet    Sig: Take 1 tablet (0.125 mg total) by mouth daily.    Dispense:  90 tablet    Refill:  3

## 2019-09-30 NOTE — Progress Notes (Signed)
PCP: Patient, No Pcp Per Cardiology: Dr. Mayford Knife HF Cardiology: Dr. Shirlee Latch  29 y.o. with history of polysubstance abuse (cocaine, heroin, methamphetamines) and smoking, endocarditis, and prolonged hospitalization 3/21-6/21 was referred by Dr. Mayford Knife for evaluation of new onset cardiomyopathy.  Patient was admitted to [facility] in 3/21 with left MCA CVA. She had revascularization of the MCA by IR, procedure complicated by left carotid dissection treated with a stent. TEE showed endocarditis => large tricuspid regurgitation with mild to moderate TR, small MV vegetation with mild MR, and aortic valve vegetation (bicuspid valve) with severe AI.  Blood cultures remained negative, she was treated with empiric abx. She had septic emboli to the brain, kidneys, spleen, and lungs.  On 04/28/19, she had bioprosthetic AVR and debridement + repair of the tricuspid valve. She additionally had a right CFV DVT.  As she was not able to be anticoagulated with septic emboli to the brain as well as the carotid dissection, she had an IVC filter placed.  After a prolonged course, she was discharged in 6/21.  She is now living at home with her mother.  She has not smoked or used drugs since she was admitted in 3/21.    She initially made progress after discharge and started to get stronger.  However, she feels like she suffered a setback in 8/21 after developing a "cold," COVID-19 negative. Since then, she has become steadily more short of breath.  She can now only walk 2-3 minutes before having to stop due to dyspnea.  She thinks she has gained about 20 lbs in < 1 month. For the last few weeks, she has had both orthopnea and PND, hard to sleep.  Mild chest discomfort at times at her chest surgical site. She is "tired all the time."  No palpitations or syncope.   Echo was done in 9/21, this showed no vegetations but EF was down to 20-25% with severely decreased RV systolic function, moderate MR, moderate TR s/p TV repair, bioprosthetic  AoV functioning normally.  Of note, intra-op TEE at time of valve surgery showed normal EF. She did not have a post-op echo until 9/21. She has no history of cardiomyopathy or MI in her family.   ECG (personally reviewed): sinus tachy 100 bpm with inferolateral TWIs  Labs (5/21): creatinine 0.73  PMH: 1. Polysubstance abuse: Cocaine, heroin, methamphetamines.  None since 3/21. 2. Prior smoker: Stopped 3/21.  3. CVA: 3/21, septic embolus.  She had MCA revascularization by IR.  - Left ICA dissection as complication of procedure requiring stent placement.  4. Endocarditis: 3/21.  Culture negative.  She had septic emboli to brain, kidney, spleen and lungs.   - TEE (3/21): EF 55-60%, normal RV, small MV vegetation with mild MR, large TV vegetation with mild-moderate TR, bicuspid aortic valve with aortic valve endocarditis and severe AI.  - 04/28/19 she had bioprosthetic AVR and debridement + repair of tricuspid valve.  5. DVT (3/21): IVC filter => not candidate for anticoagulation given septic emboli, dissection of carotid with IR procedure initially.  6. Chronic systolic CHF: Echo in 9/21 showed EF 20-25%, global hypokinesis, severely decreased RV systolic function, moderate MR, moderate TR s/p TV repair, bioprosthetic aortic valve with mean gradient 10 mmHg and no regurgitation; no vegetation noted. Most recent prior echo was the TEE done at the time of surgery in 4/21, EF was normal then.   Social History   Socioeconomic History  . Marital status: Single    Spouse name: Not on file  .  Number of children: Not on file  . Years of education: Not on file  . Highest education level: Not on file  Occupational History  . Not on file  Tobacco Use  . Smoking status: Former Games developer  . Smokeless tobacco: Never Used  Substance and Sexual Activity  . Alcohol use: Not on file  . Drug use: Not on file  . Sexual activity: Not on file  Other Topics Concern  . Not on file  Social History Narrative  .  Not on file   Social Determinants of Health   Financial Resource Strain:   . Difficulty of Paying Living Expenses: Not on file  Food Insecurity:   . Worried About Programme researcher, broadcasting/film/video in the Last Year: Not on file  . Ran Out of Food in the Last Year: Not on file  Transportation Needs:   . Lack of Transportation (Medical): Not on file  . Lack of Transportation (Non-Medical): Not on file  Physical Activity:   . Days of Exercise per Week: Not on file  . Minutes of Exercise per Session: Not on file  Stress:   . Feeling of Stress : Not on file  Social Connections:   . Frequency of Communication with Friends and Family: Not on file  . Frequency of Social Gatherings with Friends and Family: Not on file  . Attends Religious Services: Not on file  . Active Member of Clubs or Organizations: Not on file  . Attends Banker Meetings: Not on file  . Marital Status: Not on file  Intimate Partner Violence:   . Fear of Current or Ex-Partner: Not on file  . Emotionally Abused: Not on file  . Physically Abused: Not on file  . Sexually Abused: Not on file   Family History  Problem Relation Age of Onset  . Heart murmur Brother   . CAD Neg Hx   . Heart failure Neg Hx    ROS: All systems reviewed and negative except as per HPI.   Current Outpatient Medications  Medication Sig Dispense Refill  . aspirin EC 81 MG EC tablet Take 1 tablet (81 mg total) by mouth daily. 90 tablet 1  . clopidogrel (PLAVIX) 75 MG tablet Take 1 tablet (75 mg total) by mouth daily. 90 tablet 1  . escitalopram (LEXAPRO) 10 MG tablet Take 1 tablet (10 mg total) by mouth daily. 90 tablet 1  . furosemide (LASIX) 40 MG tablet Take 1 tablet (40 mg total) by mouth in the morning AND 0.5 tablets (20 mg total) every evening. 60 tablet 3  . potassium chloride SA (KLOR-CON) 20 MEQ tablet Take 1 tablet (20 mEq total) by mouth daily. 30 tablet 1  . digoxin (LANOXIN) 0.125 MG tablet Take 1 tablet (0.125 mg total) by mouth  daily. 90 tablet 3  . ferrous fumarate-b12-vitamic C-folic acid (TRINSICON / FOLTRIN) capsule Take 1 capsule by mouth 2 (two) times daily after a meal. (Patient not taking: Reported on 09/30/2019) 90 capsule 1  . sacubitril-valsartan (ENTRESTO) 24-26 MG Take 1 tablet by mouth 2 (two) times daily. 60 tablet 11  . spironolactone (ALDACTONE) 25 MG tablet Take 0.5 tablets (12.5 mg total) by mouth daily. 45 tablet 3   No current facility-administered medications for this encounter.   BP (!) 118/102   Pulse (!) 107   Ht 5\' 7"  (1.702 m)   Wt 94.9 kg   SpO2 97%   BMI 32.77 kg/m  General: NAD Neck: JVP 12-14 cm,  no thyromegaly  or thyroid nodule.  Lungs: Clear to auscultation bilaterally with normal respiratory effort. CV: Lateral PMI.  Heart mildly tachy, regular S1/S2, no S3/S4, no murmur.  1+ edema 1/2 to knees.  No carotid bruit.  Normal pedal pulses.  Abdomen: Soft, nontender, no hepatosplenomegaly, mild distention.  Skin: Intact without lesions or rashes.  Neurologic: Alert and oriented x 3.  Psych: Normal affect. Extremities: No clubbing or cyanosis.  HEENT: Normal.   Assessment/Plan: 1. Chronic systolic CHF: 9/21 echo showed no vegetations but EF was down to 20-25% with severely decreased RV systolic function, moderate MR, moderate TR s/p TV repair, bioprosthetic AoV functioning normally.  Of note, intra-op TEE at time of valve surgery showed normal EF. She did not have a post-op echo until 9/21. Now with biventricular failure.  Cause is uncertain, ?viral myocarditis ("cold" in 8/21), ?LV damage from severe aortic insufficiency now more obvious s/p AVR (but would not explain severe RV dysfunction), ?CAD (prior smoker but young age with no family history). On exam, she is volume overloaded.  NYHA class III symptoms.  She has been taking Lasix 20 mg daily.  - With volume overload and concern for possible low output, will stop metoprolol tartrate (only on low dose).  - Start Entresto 24/16  bid.  - Start spironolactone 12.5 daily.  - Increase Lasix to 40 mg bid x 5 days then 40 qam/20 qpm.  Continue KCl 20 daily. BMET/BNP today and in 1 week.  - Cardiac MRI to assess for infiltrative disease.  - I am concerned about low output HF.  I will arrange for right and left heart cath next week, will assess filling pressures and cardiac output and will rule out coronary disease.  2. H/o endocarditis: Culture negative.  S/p bioprosthetic aortic valve replacement and TV repair.  No vegetation noted on 9/21 echo.  3. Bicuspid aortic valve disorder: Now s/p bioprosthetic AVR.  Thoracic aorta not dilated on 3/21 CT.  4. H/o R CFV DVT: Has IVC filter, think ok to remove at this point.  5. H/o CVA: Had left MCA revascularization by IR, complicated by left ICA dissection and stent placement.  - On ASA 81 + Plavix for stent.  6. Polysubstance abuse: No drugs, ETOH, or smoking since 3/21.   Marca Ancona 10/01/2019

## 2019-10-05 ENCOUNTER — Other Ambulatory Visit (HOSPITAL_COMMUNITY)
Admission: RE | Admit: 2019-10-05 | Discharge: 2019-10-05 | Disposition: A | Discharge status: Home / Self Care | Payer: Medicaid Other | Source: Ambulatory Visit | Attending: Cardiology | Admitting: Cardiology

## 2019-10-05 DIAGNOSIS — Z01812 Encounter for preprocedural laboratory examination: Secondary | ICD-10-CM | POA: Diagnosis not present

## 2019-10-05 DIAGNOSIS — Z20822 Contact with and (suspected) exposure to covid-19: Secondary | ICD-10-CM | POA: Diagnosis not present

## 2019-10-05 LAB — SARS CORONAVIRUS 2 (TAT 6-24 HRS): SARS Coronavirus 2: NEGATIVE

## 2019-10-07 ENCOUNTER — Telehealth (HOSPITAL_COMMUNITY): Payer: Self-pay | Admitting: Pharmacy Technician

## 2019-10-07 NOTE — Telephone Encounter (Signed)
Advanced Heart Failure Patient Advocate Encounter  Prior Authorization for Sherryll Burger has been submitted and approved.    PA# [id number] Effective dates: 10/07/19 through 09/28/20  Archer Asa, CPhT

## 2019-10-08 ENCOUNTER — Inpatient Hospital Stay (HOSPITAL_COMMUNITY)
Admission: RE | Disposition: A | Discharge status: Home / Self Care | Payer: Self-pay | Source: Home / Self Care | Attending: Cardiology

## 2019-10-08 ENCOUNTER — Inpatient Hospital Stay (HOSPITAL_COMMUNITY)
Admission: RE | Admit: 2019-10-08 | Discharge: 2019-10-13 | DRG: 287 | Disposition: A | Discharge status: Home / Self Care | Payer: Medicaid Other | Attending: Cardiology | Admitting: Cardiology

## 2019-10-08 ENCOUNTER — Inpatient Hospital Stay: Payer: Self-pay

## 2019-10-08 ENCOUNTER — Other Ambulatory Visit: Payer: Self-pay

## 2019-10-08 DIAGNOSIS — Z7902 Long term (current) use of antithrombotics/antiplatelets: Secondary | ICD-10-CM | POA: Diagnosis not present

## 2019-10-08 DIAGNOSIS — Z8679 Personal history of other diseases of the circulatory system: Secondary | ICD-10-CM

## 2019-10-08 DIAGNOSIS — Z23 Encounter for immunization: Secondary | ICD-10-CM

## 2019-10-08 DIAGNOSIS — I5023 Acute on chronic systolic (congestive) heart failure: Secondary | ICD-10-CM | POA: Diagnosis present

## 2019-10-08 DIAGNOSIS — Z95828 Presence of other vascular implants and grafts: Secondary | ICD-10-CM | POA: Diagnosis not present

## 2019-10-08 DIAGNOSIS — I340 Nonrheumatic mitral (valve) insufficiency: Secondary | ICD-10-CM | POA: Diagnosis not present

## 2019-10-08 DIAGNOSIS — I514 Myocarditis, unspecified: Secondary | ICD-10-CM

## 2019-10-08 DIAGNOSIS — I5021 Acute systolic (congestive) heart failure: Secondary | ICD-10-CM | POA: Diagnosis present

## 2019-10-08 DIAGNOSIS — Z953 Presence of xenogenic heart valve: Secondary | ICD-10-CM | POA: Diagnosis not present

## 2019-10-08 DIAGNOSIS — I351 Nonrheumatic aortic (valve) insufficiency: Secondary | ICD-10-CM | POA: Diagnosis not present

## 2019-10-08 DIAGNOSIS — Z7982 Long term (current) use of aspirin: Secondary | ICD-10-CM

## 2019-10-08 DIAGNOSIS — Q231 Congenital insufficiency of aortic valve: Secondary | ICD-10-CM

## 2019-10-08 DIAGNOSIS — I2541 Coronary artery aneurysm: Secondary | ICD-10-CM | POA: Diagnosis present

## 2019-10-08 DIAGNOSIS — Z8673 Personal history of transient ischemic attack (TIA), and cerebral infarction without residual deficits: Secondary | ICD-10-CM | POA: Diagnosis not present

## 2019-10-08 DIAGNOSIS — Z87891 Personal history of nicotine dependence: Secondary | ICD-10-CM

## 2019-10-08 DIAGNOSIS — I5082 Biventricular heart failure: Secondary | ICD-10-CM | POA: Diagnosis present

## 2019-10-08 DIAGNOSIS — F1910 Other psychoactive substance abuse, uncomplicated: Secondary | ICD-10-CM | POA: Diagnosis present

## 2019-10-08 DIAGNOSIS — Z79899 Other long term (current) drug therapy: Secondary | ICD-10-CM | POA: Diagnosis not present

## 2019-10-08 DIAGNOSIS — I270 Primary pulmonary hypertension: Secondary | ICD-10-CM | POA: Diagnosis present

## 2019-10-08 DIAGNOSIS — I429 Cardiomyopathy, unspecified: Secondary | ICD-10-CM | POA: Diagnosis present

## 2019-10-08 DIAGNOSIS — F1410 Cocaine abuse, uncomplicated: Secondary | ICD-10-CM | POA: Diagnosis present

## 2019-10-08 DIAGNOSIS — Z86718 Personal history of other venous thrombosis and embolism: Secondary | ICD-10-CM

## 2019-10-08 DIAGNOSIS — I509 Heart failure, unspecified: Secondary | ICD-10-CM

## 2019-10-08 DIAGNOSIS — T8203XA Leakage of heart valve prosthesis, initial encounter: Secondary | ICD-10-CM | POA: Diagnosis not present

## 2019-10-08 HISTORY — PX: RIGHT/LEFT HEART CATH AND CORONARY ANGIOGRAPHY: CATH118266

## 2019-10-08 LAB — COMPREHENSIVE METABOLIC PANEL
ALT: 41 U/L (ref 0–44)
AST: 25 U/L (ref 15–41)
Albumin: 3.7 g/dL (ref 3.5–5.0)
Alkaline Phosphatase: 87 U/L (ref 38–126)
Anion gap: 11 (ref 5–15)
BUN: 12 mg/dL (ref 6–20)
CO2: 22 mmol/L (ref 22–32)
Calcium: 9 mg/dL (ref 8.9–10.3)
Chloride: 105 mmol/L (ref 98–111)
Creatinine, Ser: 1.04 mg/dL — ABNORMAL HIGH (ref 0.44–1.00)
GFR calc Af Amer: >60 mL/min (ref >60)
GFR calc non Af Amer: >60 mL/min (ref >60)
Glucose, Bld: 118 mg/dL — ABNORMAL HIGH (ref 70–99)
Potassium: 3.9 mmol/L (ref 3.5–5.1)
Sodium: 138 mmol/L (ref 135–145)
Total Bilirubin: 1.1 mg/dL (ref 0.3–1.2)
Total Protein: 7 g/dL (ref 6.5–8.1)

## 2019-10-08 LAB — BASIC METABOLIC PANEL
Anion gap: 10 (ref 5–15)
BUN: 13 mg/dL (ref 6–20)
CO2: 25 mmol/L (ref 22–32)
Calcium: 8.7 mg/dL — ABNORMAL LOW (ref 8.9–10.3)
Chloride: 101 mmol/L (ref 98–111)
Creatinine, Ser: 0.99 mg/dL (ref 0.44–1.00)
GFR calc Af Amer: >60 mL/min (ref >60)
GFR calc non Af Amer: >60 mL/min (ref >60)
Glucose, Bld: 89 mg/dL (ref 70–99)
Potassium: 3.8 mmol/L (ref 3.5–5.1)
Sodium: 136 mmol/L (ref 135–145)

## 2019-10-08 LAB — CBC WITH DIFFERENTIAL/PLATELET
Abs Immature Granulocytes: 0.01 10*3/uL (ref 0.00–0.07)
Basophils Absolute: 0.1 10*3/uL (ref 0.0–0.1)
Basophils Relative: 1 %
Eosinophils Absolute: 0.1 10*3/uL (ref 0.0–0.5)
Eosinophils Relative: 2 %
HCT: 46.7 % — ABNORMAL HIGH (ref 36.0–46.0)
Hemoglobin: 14.5 g/dL (ref 12.0–15.0)
Immature Granulocytes: 0 %
Lymphocytes Relative: 70 %
Lymphs Abs: 3.3 10*3/uL (ref 0.7–4.0)
MCH: 25.8 pg — ABNORMAL LOW (ref 26.0–34.0)
MCHC: 31 g/dL (ref 30.0–36.0)
MCV: 83.2 fL (ref 80.0–100.0)
Monocytes Absolute: 0.2 10*3/uL (ref 0.1–1.0)
Monocytes Relative: 4 %
Neutro Abs: 1.1 10*3/uL — ABNORMAL LOW (ref 1.7–7.7)
Neutrophils Relative %: 23 %
Platelets: 276 10*3/uL (ref 150–400)
RBC: 5.61 MIL/uL — ABNORMAL HIGH (ref 3.87–5.11)
RDW: 16 % — ABNORMAL HIGH (ref 11.5–15.5)
WBC: 4.7 10*3/uL (ref 4.0–10.5)
nRBC: 0 % (ref 0.0–0.2)

## 2019-10-08 LAB — POCT I-STAT EG7
Acid-Base Excess: 0 mmol/L (ref 0.0–2.0)
Acid-Base Excess: 2 mmol/L (ref 0.0–2.0)
Bicarbonate: 26.1 mmol/L (ref 20.0–28.0)
Bicarbonate: 27.8 mmol/L (ref 20.0–28.0)
Calcium, Ion: 1.15 mmol/L (ref 1.15–1.40)
Calcium, Ion: 1.22 mmol/L (ref 1.15–1.40)
HCT: 44 % (ref 36.0–46.0)
HCT: 45 % (ref 36.0–46.0)
Hemoglobin: 15 g/dL (ref 12.0–15.0)
Hemoglobin: 15.3 g/dL — ABNORMAL HIGH (ref 12.0–15.0)
O2 Saturation: 57 %
O2 Saturation: 60 %
Potassium: 3.8 mmol/L (ref 3.5–5.1)
Potassium: 4 mmol/L (ref 3.5–5.1)
Sodium: 141 mmol/L (ref 135–145)
Sodium: 142 mmol/L (ref 135–145)
TCO2: 28 mmol/L (ref 22–32)
TCO2: 29 mmol/L (ref 22–32)
pCO2, Ven: 45.9 mmHg (ref 44.0–60.0)
pCO2, Ven: 48.1 mmHg (ref 44.0–60.0)
pH, Ven: 7.364 (ref 7.250–7.430)
pH, Ven: 7.37 (ref 7.250–7.430)
pO2, Ven: 31 mmHg — CL (ref 32.0–45.0)
pO2, Ven: 32 mmHg (ref 32.0–45.0)

## 2019-10-08 LAB — TSH: TSH: 6.541 u[IU]/mL — ABNORMAL HIGH (ref 0.350–4.500)

## 2019-10-08 LAB — PREGNANCY, URINE: Preg Test, Ur: NEGATIVE

## 2019-10-08 LAB — BRAIN NATRIURETIC PEPTIDE: B Natriuretic Peptide: 1144.6 pg/mL — ABNORMAL HIGH (ref 0.0–100.0)

## 2019-10-08 SURGERY — RIGHT/LEFT HEART CATH AND CORONARY ANGIOGRAPHY
Anesthesia: LOCAL

## 2019-10-08 MED ORDER — CLOPIDOGREL BISULFATE 75 MG PO TABS: 75.0000 mg | ORAL_TABLET | Freq: Once | ORAL | Status: DC

## 2019-10-08 MED ORDER — SODIUM CHLORIDE 0.9% FLUSH: 3.0000 mL | INTRAVENOUS | Status: DC | PRN

## 2019-10-08 MED ORDER — ASPIRIN 81 MG PO CHEW: 81.0000 mg | CHEWABLE_TABLET | ORAL | Status: DC

## 2019-10-08 MED ORDER — FUROSEMIDE 10 MG/ML IJ SOLN
80.0000 mg | Freq: Two times a day (BID) | INTRAMUSCULAR | Status: DC
  Administered 2019-10-08 – 2019-10-10 (×4): 80 mg via INTRAVENOUS
  Filled 2019-10-08 (×4): qty 8

## 2019-10-08 MED ORDER — HYDRALAZINE HCL 20 MG/ML IJ SOLN: 10.0000 mg | INTRAMUSCULAR | Status: AC | PRN

## 2019-10-08 MED ORDER — VERAPAMIL HCL 2.5 MG/ML IV SOLN
INTRAVENOUS | Status: AC
  Filled 2019-10-08: qty 2

## 2019-10-08 MED ORDER — SODIUM CHLORIDE 0.9 % IV SOLN: INTRAVENOUS | Status: DC

## 2019-10-08 MED ORDER — HEPARIN (PORCINE) IN NACL 1000-0.9 UT/500ML-% IV SOLN
INTRAVENOUS | Status: DC | PRN
  Administered 2019-10-08 (×2): 500 mL

## 2019-10-08 MED ORDER — ESCITALOPRAM OXALATE 10 MG PO TABS
10.0000 mg | ORAL_TABLET | Freq: Every day | ORAL | Status: DC
  Administered 2019-10-09 – 2019-10-13 (×5): 10 mg via ORAL
  Filled 2019-10-08 (×6): qty 1

## 2019-10-08 MED ORDER — LIDOCAINE HCL (PF) 1 % IJ SOLN
INTRAMUSCULAR | Status: DC | PRN
  Administered 2019-10-08 (×2): 2 mL

## 2019-10-08 MED ORDER — ONDANSETRON HCL 4 MG/2ML IJ SOLN: 4.0000 mg | Freq: Four times a day (QID) | INTRAMUSCULAR | Status: DC | PRN

## 2019-10-08 MED ORDER — IOHEXOL 350 MG/ML SOLN
INTRAVENOUS | Status: DC | PRN
  Administered 2019-10-08: 70 mL

## 2019-10-08 MED ORDER — FENTANYL CITRATE (PF) 100 MCG/2ML IJ SOLN
INTRAMUSCULAR | Status: DC | PRN
Start: 2019-10-08 — End: 2019-10-08
  Administered 2019-10-08: 25 ug via INTRAVENOUS

## 2019-10-08 MED ORDER — SODIUM CHLORIDE 0.9 % IV SOLN: 250.0000 mL | INTRAVENOUS | Status: DC | PRN

## 2019-10-08 MED ORDER — LIDOCAINE HCL (PF) 1 % IJ SOLN
INTRAMUSCULAR | Status: AC
  Filled 2019-10-08: qty 30

## 2019-10-08 MED ORDER — MILRINONE LACTATE IN DEXTROSE 20-5 MG/100ML-% IV SOLN: 0.2500 ug/kg/min | INTRAVENOUS | Status: DC

## 2019-10-08 MED ORDER — MIDAZOLAM HCL 2 MG/2ML IJ SOLN
INTRAMUSCULAR | Status: AC
  Filled 2019-10-08: qty 2

## 2019-10-08 MED ORDER — SPIRONOLACTONE 12.5 MG HALF TABLET
12.5000 mg | ORAL_TABLET | Freq: Every day | ORAL | Status: DC
  Administered 2019-10-09 – 2019-10-13 (×5): 12.5 mg via ORAL
  Filled 2019-10-08 (×6): qty 1

## 2019-10-08 MED ORDER — SODIUM CHLORIDE 0.9% FLUSH
3.0000 mL | Freq: Two times a day (BID) | INTRAVENOUS | Status: DC
  Administered 2019-10-08 – 2019-10-12 (×8): 3 mL via INTRAVENOUS

## 2019-10-08 MED ORDER — VERAPAMIL HCL 2.5 MG/ML IV SOLN
INTRAVENOUS | Status: DC | PRN
  Administered 2019-10-08: 10 mL via INTRA_ARTERIAL

## 2019-10-08 MED ORDER — CLOPIDOGREL BISULFATE 75 MG PO TABS
75.0000 mg | ORAL_TABLET | Freq: Every day | ORAL | Status: DC
  Administered 2019-10-09 – 2019-10-13 (×5): 75 mg via ORAL
  Filled 2019-10-08 (×6): qty 1

## 2019-10-08 MED ORDER — SODIUM CHLORIDE 0.9% FLUSH: 3.0000 mL | Freq: Two times a day (BID) | INTRAVENOUS | Status: DC

## 2019-10-08 MED ORDER — SODIUM CHLORIDE 0.9% FLUSH
10.0000 mL | Freq: Two times a day (BID) | INTRAVENOUS | Status: DC
  Administered 2019-10-08: 20 mL
  Administered 2019-10-09 – 2019-10-10 (×2): 10 mL
  Administered 2019-10-11: 20 mL
  Administered 2019-10-11 – 2019-10-12 (×2): 10 mL
  Administered 2019-10-12: 20 mL
  Administered 2019-10-13: 10 mL

## 2019-10-08 MED ORDER — HEPARIN SODIUM (PORCINE) 1000 UNIT/ML IJ SOLN
INTRAMUSCULAR | Status: DC | PRN
  Administered 2019-10-08: 4000 [IU] via INTRAVENOUS

## 2019-10-08 MED ORDER — FENTANYL CITRATE (PF) 100 MCG/2ML IJ SOLN
INTRAMUSCULAR | Status: AC
  Filled 2019-10-08: qty 2

## 2019-10-08 MED ORDER — SACUBITRIL-VALSARTAN 24-26 MG PO TABS
1.0000 | ORAL_TABLET | Freq: Two times a day (BID) | ORAL | Status: DC
  Administered 2019-10-08 (×2): 1 via ORAL
  Filled 2019-10-08 (×2): qty 1

## 2019-10-08 MED ORDER — ENOXAPARIN SODIUM 40 MG/0.4ML ~~LOC~~ SOLN: 40.0000 mg | SUBCUTANEOUS | Status: DC

## 2019-10-08 MED ORDER — ENOXAPARIN SODIUM 40 MG/0.4ML ~~LOC~~ SOLN
40.0000 mg | SUBCUTANEOUS | Status: DC
  Administered 2019-10-09 – 2019-10-13 (×5): 40 mg via SUBCUTANEOUS
  Filled 2019-10-08 (×5): qty 0.4

## 2019-10-08 MED ORDER — SODIUM CHLORIDE 0.9% FLUSH
3.0000 mL | Freq: Two times a day (BID) | INTRAVENOUS | Status: DC
  Administered 2019-10-08 – 2019-10-12 (×4): 3 mL via INTRAVENOUS

## 2019-10-08 MED ORDER — DIGOXIN 125 MCG PO TABS
0.1250 mg | ORAL_TABLET | Freq: Every day | ORAL | Status: DC
  Administered 2019-10-09 – 2019-10-13 (×5): 0.125 mg via ORAL
  Filled 2019-10-08 (×6): qty 1

## 2019-10-08 MED ORDER — ACETAMINOPHEN 325 MG PO TABS: 650.0000 mg | ORAL_TABLET | ORAL | Status: DC | PRN

## 2019-10-08 MED ORDER — HEPARIN SODIUM (PORCINE) 1000 UNIT/ML IJ SOLN
INTRAMUSCULAR | Status: AC
  Filled 2019-10-08: qty 1

## 2019-10-08 MED ORDER — MILRINONE LACTATE IN DEXTROSE 20-5 MG/100ML-% IV SOLN
0.1250 ug/kg/min | INTRAVENOUS | Status: DC
  Administered 2019-10-08 – 2019-10-09 (×2): 0.25 ug/kg/min via INTRAVENOUS
  Filled 2019-10-08 (×4): qty 100

## 2019-10-08 MED ORDER — MIDAZOLAM HCL 2 MG/2ML IJ SOLN
INTRAMUSCULAR | Status: DC | PRN
  Administered 2019-10-08: 1 mg via INTRAVENOUS

## 2019-10-08 MED ORDER — HEPARIN (PORCINE) IN NACL 1000-0.9 UT/500ML-% IV SOLN
INTRAVENOUS | Status: AC
  Filled 2019-10-08: qty 1000

## 2019-10-08 MED ORDER — LABETALOL HCL 5 MG/ML IV SOLN: 10.0000 mg | INTRAVENOUS | Status: AC | PRN

## 2019-10-08 MED ORDER — SODIUM CHLORIDE 0.9% FLUSH: 10.0000 mL | INTRAVENOUS | Status: DC | PRN

## 2019-10-08 MED ORDER — CHLORHEXIDINE GLUCONATE CLOTH 2 % EX PADS
6.0000 | MEDICATED_PAD | Freq: Every day | CUTANEOUS | Status: DC
  Administered 2019-10-08 – 2019-10-12 (×3): 6 via TOPICAL

## 2019-10-08 SURGICAL SUPPLY — 13 items
CATH 5FR JL3.5 JR4 ANG PIG MP (CATHETERS) ×2 IMPLANT
CATH BALLN WEDGE 5F 110CM (CATHETERS) ×2 IMPLANT
CATH INFINITI 5 FR 3DRC (CATHETERS) ×2 IMPLANT
CATH LAUNCHER 5F JL3 (CATHETERS) ×1 IMPLANT
CATHETER LAUNCHER 5F JL3 (CATHETERS) ×2
DEVICE RAD COMP TR BAND LRG (VASCULAR PRODUCTS) ×2 IMPLANT
GLIDESHEATH SLEND SS 6F .021 (SHEATH) ×2 IMPLANT
GUIDEWIRE INQWIRE 1.5J.035X260 (WIRE) ×1 IMPLANT
INQWIRE 1.5J .035X260CM (WIRE) ×2
KIT HEART LEFT (KITS) ×2 IMPLANT
PACK CARDIAC CATHETERIZATION (CUSTOM PROCEDURE TRAY) ×2 IMPLANT
SHEATH GLIDE SLENDER 4/5FR (SHEATH) ×2 IMPLANT
TRANSDUCER W/STOPCOCK (MISCELLANEOUS) ×2 IMPLANT

## 2019-10-08 NOTE — Progress Notes (Signed)
Peripherally Inserted Central Catheter Placement  The IV Nurse has discussed with the patient and/or persons authorized to consent for the patient, the purpose of this procedure and the potential benefits and risks involved with this procedure.  The benefits include less needle sticks, lab draws from the catheter, and the patient may be discharged home with the catheter. Risks include, but not limited to, infection, bleeding, blood clot (thrombus formation), and puncture of an artery; nerve damage and irregular heartbeat and possibility to perform a PICC exchange if needed/ordered by physician.  Alternatives to this procedure were also discussed.  Bard Power PICC patient education guide, fact sheet on infection prevention and patient information card has been provided to patient /or left at bedside.    PICC Placement Documentation  PICC Double Lumen 10/08/19 PICC Right Brachial 40 cm 2 cm (Active)  Indication for Insertion or Continuance of Line Chronic illness with exacerbations (CF, Sickle Cell, etc.);Vasoactive infusions 10/08/19 2120  Exposed Catheter (cm) 2 cm 10/08/19 2120  Site Assessment Clean;Dry;Intact 10/08/19 2120  Lumen #1 Status Flushed;Saline locked;Blood return noted 10/08/19 2120  Lumen #2 Status Flushed;Saline locked;Blood return noted 10/08/19 2120  Dressing Type Transparent 10/08/19 2120  Dressing Status Clean;Dry;Intact 10/08/19 2120  Antimicrobial disc in place? Yes 10/08/19 2120  Safety Lock Not Applicable 10/08/19 2120  Line Care Connections checked and tightened 10/08/19 2120  Line Adjustment (NICU/IV Team Only) No 10/08/19 2120  Dressing Intervention New dressing 10/08/19 2120  Dressing Change Due 10/15/19 10/08/19 2120       Charmaine Placido, Lajean Manes 10/08/2019, 9:22 PM

## 2019-10-08 NOTE — Progress Notes (Signed)
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  Per hospital policy blood was drawn for exposure panel. 

## 2019-10-08 NOTE — Interval H&P Note (Signed)
History and Physical Interval Note:  10/08/2019 11:37 AM  Lauren Reyes  has presented today for surgery, with the diagnosis of myocarditis.  The various methods of treatment have been discussed with the patient and family. After consideration of risks, benefits and other options for treatment, the patient has consented to  Procedure(s): RIGHT/LEFT HEART CATH AND CORONARY ANGIOGRAPHY (N/A) as a surgical intervention.  The patient's history has been reviewed, patient examined, no change in status, stable for surgery.  I have reviewed the patient's chart and labs.  Questions were answered to the patient's satisfaction.     [name] [employer]

## 2019-10-08 NOTE — Progress Notes (Signed)
   10/08/19 1615  Assess: MEWS Score  BP (!) 115/96  ECG Heart Rate (!) 111  Resp 20  Level of Consciousness Alert  SpO2 98 %  O2 Device Room Air  Assess: MEWS Score  MEWS Temp 0  MEWS Systolic 0  MEWS Pulse 2  MEWS RR 0  MEWS LOC 0  MEWS Score 2  MEWS Score Color Yellow  Assess: if the MEWS score is Yellow or Red  Were vital signs taken at a resting state? Yes  Focused Assessment No change from prior assessment  Early Detection of Sepsis Score *See Row Information* Low  MEWS guidelines implemented *See Row Information* Yes  Treat  MEWS Interventions Administered scheduled meds/treatments  Pain Scale 0-10  Pain Score 0  Take Vital Signs  Increase Vital Sign Frequency  Yellow: Q 2hr X 2 then Q 4hr X 2, if remains yellow, continue Q 4hrs  Escalate  MEWS: Escalate Yellow: discuss with charge nurse/RN and consider discussing with provider and RRT  Notify: Charge Nurse/RN  Name of Charge Nurse/RN Notified Christy, RN  Date Charge Nurse/RN Notified 10/08/19  Time Charge Nurse/RN Notified 1633  Document  Patient Outcome Other (Comment)  Progress note created (see row info) Yes  Yellow Mews Protocol Implemented.

## 2019-10-08 NOTE — H&P (Signed)
Advanced Heart Failure Team History and Physical Note   PCP:  Patient, No Pcp Per  PCP-Cardiology: Armanda Magicraci Turner, MD     Reason for Admission: CHF   HPI:    29 y.o. with history of polysubstance abuse (cocaine, heroin, methamphetamines) and smoking, endocarditis, and prolonged hospitalization 3/21-6/21 was referred by Dr. Mayford Knifeurner for evaluation of new onset cardiomyopathy.  Patient was [facility] in 3/21 with left MCA CVA. She had revascularization of the MCA by IR, procedure complicated by left carotid dissection treated with a stent. TEE showed endocarditis => large tricuspid regurgitation with mild to moderate TR, small MV vegetation with mild MR, and aortic valve vegetation (bicuspid valve) with severe AI.  Blood cultures remained negative, she was treated with empiric abx. She had septic emboli to the brain, kidneys, spleen, and lungs.  On 04/28/19, she had bioprosthetic AVR and debridement + repair of the tricuspid valve. She additionally had a right CFV DVT.  As she was not able to be anticoagulated with septic emboli to the brain as well as the carotid dissection, she had an IVC filter placed.  After a prolonged course, she was discharged in 6/21.  She is now living at home with her mother.  She has not smoked or used drugs since she was admitted in 3/21.    She initially made progress after discharge and started to get stronger.  However, she feels like she suffered a setback in 8/21 after developing a "cold," COVID-19 negative. Since then, she has become steadily more short of breath.  She can now only walk 2-3 minutes before having to stop due to dyspnea.  She thinks she has gained about 20 lbs in < 1 month. For the last few weeks, she has had both orthopnea and PND, hard to sleep.  Mild chest discomfort at times at her chest surgical site. She is "tired all the time."  No palpitations or syncope.   Echo was done in 9/21, this showed no vegetations but EF was down to 20-25% with  severely decreased RV systolic function, moderate MR, moderate TR s/p TV repair, bioprosthetic AoV functioning normally.  Of note, intra-op TEE at time of valve surgery showed normal EF. She did not have a post-op echo until 9/21. She has no history of cardiomyopathy or MI in her family.   Lasix was increased and medications adjusted at initial appointment, she feels somewhat better but still very limited.  She was taken for RHC/LHC today to assess filling pressures and cardiac output, also to make sure coronaries are patent s/p aortic valve surgery. Cath showed significantly elevated left and right heart filling pressures and low cardiac output. Also noted was a fistulous connection from the right coronary cusp of the aortic root to the left ventricle.   With decompensated CHF and root => LV fistula, she was admitted.    Review of Systems: All systems reviewed and negative except as per HPI.   Home Medications Prior to Admission medications   Medication Sig Start Date End Date Taking? Authorizing Provider  aspirin EC 81 MG EC tablet Take 1 tablet (81 mg total) by mouth daily. 06/09/19  Yes Almon HerculesGonfa, Taye T, MD  clopidogrel (PLAVIX) 75 MG tablet Take 1 tablet (75 mg total) by mouth daily. 06/09/19  Yes Almon HerculesGonfa, Taye T, MD  digoxin (LANOXIN) 0.125 MG tablet Take 1 tablet (0.125 mg total) by mouth daily. 09/30/19  Yes Laurey MoraleMcLean, Oliviarose Punch S, MD  escitalopram (LEXAPRO) 10 MG tablet Take 1 tablet (  10 mg total) by mouth daily. 06/09/19  Yes Almon Hercules, MD  furosemide (LASIX) 40 MG tablet Take 1 tablet (40 mg total) by mouth in the morning AND 0.5 tablets (20 mg total) every evening. 09/30/19  Yes Laurey Morale, MD  potassium chloride SA (KLOR-CON) 20 MEQ tablet Take 1 tablet (20 mEq total) by mouth daily. 06/09/19  Yes Almon Hercules, MD  spironolactone (ALDACTONE) 25 MG tablet Take 0.5 tablets (12.5 mg total) by mouth daily. 09/30/19 12/29/19 Yes Laurey Morale, MD  ferrous fumarate-b12-vitamic C-folic acid  (TRINSICON / FOLTRIN) capsule Take 1 capsule by mouth 2 (two) times daily after a meal. Patient not taking: Reported on 09/30/2019 06/09/19   Almon Hercules, MD  sacubitril-valsartan (ENTRESTO) 24-26 MG Take 1 tablet by mouth 2 (two) times daily. 09/30/19   Laurey Morale, MD    Past Medical History: 1. Polysubstance abuse: Cocaine, heroin, methamphetamines.  None since 3/21. 2. Prior smoker: Stopped 3/21.  3. CVA: 3/21, septic embolus.  She had MCA revascularization by IR.  - Left ICA dissection as complication of procedure requiring stent placement.  4. Endocarditis: 3/21.  Culture negative.  She had septic emboli to brain, kidney, spleen and lungs.   - TEE (3/21): EF 55-60%, normal RV, small MV vegetation with mild MR, large TV vegetation with mild-moderate TR, bicuspid aortic valve with aortic valve endocarditis and severe AI.  - 04/28/19 she had bioprosthetic AVR and debridement + repair of tricuspid valve.  5. DVT (3/21): IVC filter => not candidate for anticoagulation given septic emboli, dissection of carotid with IR procedure initially.  6. Chronic systolic CHF: Echo in 9/21 showed EF 20-25%, global hypokinesis, severely decreased RV systolic function, moderate MR, moderate TR s/p TV repair, bioprosthetic aortic valve with mean gradient 10 mmHg and no regurgitation; no vegetation noted. Most recent prior echo was the TEE done at the time of surgery in 4/21, EF was normal then.   Past Surgical History: Past Surgical History:  Procedure Laterality Date  . AORTIC VALVE REPLACEMENT N/A 04/28/2019   Procedure: AORTIC VALVE REPLACEMENT (AVR) using [facility] Pericardial Bioprosthesis - 29 MM Aortic Valve.;  Surgeon: Delight Ovens, MD;  Location: [facility] OR;  Service: Open Heart Surgery;  Laterality: N/A;  . BUBBLE STUDY  04/07/2019   Procedure: BUBBLE STUDY;  Surgeon: Jodelle Red, MD;  Location: [facility] ENDOSCOPY;  Service: Cardiovascular;;  . IR CT HEAD LTD  04/01/2019  .  IR INTRAVSC STENT CERV CAROTID W/O EMB-PROT MOD SED INC ANGIO  04/01/2019  . IR IVC FILTER PLMT / S&I /IMG GUID/MOD SED  04/03/2019  . IR PERCUTANEOUS ART THROMBECTOMY/INFUSION INTRACRANIAL INC DIAG ANGIO  04/01/2019  . IR RADIOLOGIST EVAL & MGMT  08/26/2019  . RADIOLOGY WITH ANESTHESIA N/A 04/01/2019   Procedure: IR WITH ANESTHESIA;  Surgeon: Julieanne Cotton, MD;  Location: [facility] OR;  Service: Radiology;  Laterality: N/A;  . TEE WITHOUT CARDIOVERSION N/A 04/07/2019   Procedure: TRANSESOPHAGEAL ECHOCARDIOGRAM (TEE);  Surgeon: Jodelle Red, MD;  Location: [facility] ENDOSCOPY;  Service: Cardiovascular;  Laterality: N/A;  . TEE WITHOUT CARDIOVERSION N/A 04/28/2019   Procedure: TRANSESOPHAGEAL ECHOCARDIOGRAM (TEE);  Surgeon: Delight Ovens, MD;  Location: [facility] OR;  Service: Open Heart Surgery;  Laterality: N/A;  . TRICUSPID VALVE REPLACEMENT N/A 04/28/2019   Procedure: REPAIR OF TRICUSPID;  Surgeon: Delight Ovens, MD;  Location: [facility] OR;  Service: Open Heart Surgery;  Laterality: N/A;    Family History:  Family History  Problem  Relation Age of Onset  . Heart murmur Brother   . CAD Neg Hx   . Heart failure Neg Hx     Social History: Social History   Socioeconomic History  . Marital status: Single    Spouse name: Not on file  . Number of children: Not on file  . Years of education: Not on file  . Highest education level: Not on file  Occupational History  . Not on file  Tobacco Use  . Smoking status: Former Games developer  . Smokeless tobacco: Never Used  Substance and Sexual Activity  . Alcohol use: Not on file  . Drug use: Not on file  . Sexual activity: Not on file  Other Topics Concern  . Not on file  Social History Narrative  . Not on file   Social Determinants of Health   Financial Resource Strain:   . Difficulty of Paying Living Expenses: Not on file  Food Insecurity:   . Worried About Programme researcher, broadcasting/film/video in the Last Year: Not on file  . Ran Out of Food in the Last Year:  Not on file  Transportation Needs:   . Lack of Transportation (Medical): Not on file  . Lack of Transportation (Non-Medical): Not on file  Physical Activity:   . Days of Exercise per Week: Not on file  . Minutes of Exercise per Session: Not on file  Stress:   . Feeling of Stress : Not on file  Social Connections:   . Frequency of Communication with Friends and Family: Not on file  . Frequency of Social Gatherings with Friends and Family: Not on file  . Attends Religious Services: Not on file  . Active Member of Clubs or Organizations: Not on file  . Attends Banker Meetings: Not on file  . Marital Status: Not on file    Allergies:  No Known Allergies  Objective:    Vital Signs:   Pulse Rate:  [58-100] 58 (09/30 1315) Resp:  [4-120] 30 (09/30 1315) BP: (115-137)/(84-105) 133/90 (09/30 1315) SpO2:  [95 %-100 %] 95 % (09/30 1315) Weight:  [90.3 kg] 90.3 kg (09/30 1048)   Filed Weights   10/08/19 1048  Weight: 90.3 kg     Physical Exam     General:  Well appearing. No respiratory difficulty HEENT: Normal Neck: Supple. JVP 14 cm. Carotids 2+ bilat; no bruits. No lymphadenopathy or thyromegaly appreciated. Cor: PMI nondisplaced. Regular rate & rhythm. 2/6 SEM RUSB with soft diastolic murmur.  Lungs: Clear Abdomen: Soft, nontender, nondistended. No hepatosplenomegaly. No bruits or masses. Good bowel sounds. Extremities: No cyanosis, clubbing, rash. 1+ edema 1/2 to knees bilaterally.  Neuro: Alert & oriented x 3, cranial nerves grossly intact. moves all 4 extremities w/o difficulty. Affect pleasant.   Telemetry   NSR around 100 bpm  EKG   Not done yet  Labs     Basic Metabolic Panel: Recent Labs  Lab 10/08/19 1225  NA 136  142  141  K 3.8  4.0  3.8  CL 101  CO2 25  GLUCOSE 89  BUN 13  CREATININE 0.99  CALCIUM 8.7*    Liver Function Tests: No results for input(s): AST, ALT, ALKPHOS, BILITOT, PROT, ALBUMIN in the last 168 hours. No  results for input(s): LIPASE, AMYLASE in the last 168 hours. No results for input(s): AMMONIA in the last 168 hours.  CBC: Recent Labs  Lab 10/08/19 1225  HGB 15.3*  15.0  HCT 45.0  44.0  Cardiac Enzymes: No results for input(s): CKTOTAL, CKMB, CKMBINDEX, TROPONINI in the last 168 hours.  BNP: BNP (last 3 results) Recent Labs    04/15/19 2044 09/30/19 1135  BNP 590.1* 2,906.1*    ProBNP (last 3 results) No results for input(s): PROBNP in the last 8760 hours.   CBG: No results for input(s): GLUCAP in the last 168 hours.  Coagulation Studies: No results for input(s): LABPROT, INR in the last 72 hours.  Imaging: CARDIAC CATHETERIZATION  Result Date: 10/08/2019 1. Significantly elevated right and left heart filling pressures. 2. Primarily pulmonary venous hypertension. 3. Low cardiac output. 4. No significant coronary disease. 5. Fistulous connection from the right coronary cusp of the aortic root to the left ventricle. Admit for diuresis, will start milrinone.  She will need TEE to better assess the aortic root => LV fistula.   [place] EKG SITE RITE  Result Date: 10/08/2019 If Site Rite image not attached, placement could not be confirmed due to current cardiac rhythm.      Assessment/Plan   1. Acute on chronic systolic CHF: 9/21 echo showed no vegetations but EF was down to 20-25% with severely decreased RV systolic function, moderate MR, moderate TR s/p TV repair, bioprosthetic AoV functioning normally (but there is evidence of peri-valvular leakage).  Of note, intra-op TEE at time of valve surgery showed normal EF. She did not have a post-op echo until 9/21. Now with biventricular failure.  Cause is uncertain, ?viral myocarditis ("cold" in 8/21), ?LV damage from severe aortic insufficiency now more obvious s/p AVR (but would not explain severe RV dysfunction), ?volume load from aortic root => LV fistula. On exam, she is volume overloaded and RHC showed significantly  elevated PCWP and RA pressure.  Cardiac index low at 1.6.  NYHA class IIIb symptoms.  Admit today for diuresis.  - I will start milrinone 0.25 mcg/kg/min with low output.  - Place PICC to follow CVP and co-ox.  - Start with Lasix 80 mg IV bid.  - Start Entresto 24/26 bid.  - Continue spironolactone 12.5 daily.  - Digoxin 0.125 daily.  - Cardiac MRI eventually to assess for infiltrative disease.  2. H/o endocarditis: Culture negative.  S/p bioprosthetic aortic valve replacement and TV repair. 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  - Send blood cultures.  3. Bicuspid aortic valve disorder: Now s/p bioprosthetic AVR.  Thoracic aorta not dilated on 3/21 CT.  4. H/o R CFV DVT: Has IVC filter, think ok to remove at this point.  5. H/o CVA: Had left MCA revascularization by IR, complicated by left ICA dissection and stent placement.  - On ASA 81 + Plavix for stent.  6. Polysubstance abuse: No drugs, ETOH, or smoking since 3/21.  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  Suspect volume load is worsening CHF.  Suspect this is a sequelae of prior endocarditis.  She denies fever.  - Send blood cultures, CBC as above.  - TEE to more closely evaluate the fistula.  - May be candidate for transcatheter plug placement. Will review with Dr. Excell Seltzer.    Marca Ancona, MD 10/08/2019, 1:17 PM  Advanced Heart Failure Team Pager [phone] (M-F; 7a - 4p)  Please contact [facility] for night-coverage after hours (4p -7a ) and weekends on [email or web address]

## 2019-10-08 NOTE — Anesthesia Preprocedure Evaluation (Addendum)
Anesthesia Evaluation  Patient identified by MRN, date of birth, ID band Patient awake    Reviewed: Allergy & Precautions, NPO status , Patient's Chart, lab work & pertinent test results  History of Anesthesia Complications Negative for: history of anesthetic complications  Airway Mallampati: II  TM Distance: >3 FB Neck ROM: Full    Dental  (+) Teeth Intact, Missing, Dental Advisory Given   Pulmonary neg recent URI, former smoker,    Pulmonary exam normal breath sounds clear to auscultation       Cardiovascular +CHF  Normal cardiovascular exam Rhythm:Regular Rate:Normal  Echo 09/25/2019 1. Left ventricular ejection fraction, by estimation, is 20 to 25%. The left ventricle has severely decreased function. The left ventricle demonstrates global hypokinesis. Left ventricular diastolic function could not be evaluated.  2. Right ventricular systolic function is severely reduced. The right ventricular size is normal. There is mildly elevated pulmonary artery systolic pressure. The estimated right ventricular systolic pressure is 49.7 mmHg.  3. The mitral valve is normal in structure. Moderate mitral valve regurgitation. No evidence of mitral stenosis.  4. The tricuspid valve is degenerative. Tricuspid valve regurgitation is moderate.  5. The aortic valve has been repaired/replaced. There is a 19 mm [employer] valve present in the aortic position. Aortic valveregurgitation is not visualized. No aortic stenosis is present. Aortic valve mean gradient measures 10.0 mmHg. Aortic valve peak gradient measures 17.1 mmHg.  6. Compared to prior echo, biventricular failure is new.    Neuro/Psych CVA negative psych ROS   GI/Hepatic negative GI ROS, (+)     substance abuse  marijuana use,   Endo/Other  negative endocrine ROS  Renal/GU negative Renal ROS     Musculoskeletal   Abdominal (+) + obese,   Peds   Hematology  (+) anemia ,   Anesthesia Other Findings   Reproductive/Obstetrics                           Anesthesia Physical  Anesthesia Plan  ASA: IV  Anesthesia Plan: MAC   Post-op Pain Management:    Induction: Intravenous  PONV Risk Score and Plan: 3 and 2 and Treatment may vary due to age or medical condition and Propofol infusion  Airway Management Planned: Natural Airway  Additional Equipment:   Intra-op Plan:   Post-operative Plan:   Informed Consent: I have reviewed the patients History and Physical, chart, labs and discussed the procedure including the risks, benefits and alternatives for the proposed anesthesia with the patient or authorized representative who has indicated his/her understanding and acceptance.     Dental advisory given  Plan Discussed with: CRNA  Anesthesia Plan Comments:        Anesthesia Quick Evaluation

## 2019-10-09 ENCOUNTER — Inpatient Hospital Stay (HOSPITAL_COMMUNITY): Payer: Medicaid Other

## 2019-10-09 ENCOUNTER — Encounter (HOSPITAL_COMMUNITY): Payer: Self-pay | Admitting: Cardiology

## 2019-10-09 ENCOUNTER — Encounter (HOSPITAL_COMMUNITY)
Admission: RE | Disposition: A | Discharge status: Home / Self Care | Payer: Self-pay | Source: Home / Self Care | Attending: Cardiology

## 2019-10-09 ENCOUNTER — Inpatient Hospital Stay (HOSPITAL_COMMUNITY): Payer: Medicaid Other | Admitting: Anesthesiology

## 2019-10-09 DIAGNOSIS — I5021 Acute systolic (congestive) heart failure: Secondary | ICD-10-CM

## 2019-10-09 DIAGNOSIS — I351 Nonrheumatic aortic (valve) insufficiency: Secondary | ICD-10-CM

## 2019-10-09 DIAGNOSIS — I340 Nonrheumatic mitral (valve) insufficiency: Secondary | ICD-10-CM

## 2019-10-09 HISTORY — PX: TEE WITHOUT CARDIOVERSION: SHX5443

## 2019-10-09 LAB — BASIC METABOLIC PANEL
Anion gap: 10 (ref 5–15)
BUN: 12 mg/dL (ref 6–20)
CO2: 26 mmol/L (ref 22–32)
Calcium: 8.4 mg/dL — ABNORMAL LOW (ref 8.9–10.3)
Chloride: 103 mmol/L (ref 98–111)
Creatinine, Ser: 1.08 mg/dL — ABNORMAL HIGH (ref 0.44–1.00)
GFR calc Af Amer: >60 mL/min (ref >60)
GFR calc non Af Amer: >60 mL/min (ref >60)
Glucose, Bld: 93 mg/dL (ref 70–99)
Potassium: 3.7 mmol/L (ref 3.5–5.1)
Sodium: 139 mmol/L (ref 135–145)

## 2019-10-09 LAB — COOXEMETRY PANEL
Carboxyhemoglobin: 1.2 % (ref 0.5–1.5)
Methemoglobin: 0.7 % (ref 0.0–1.5)
O2 Saturation: 69.3 %
Total hemoglobin: 13.6 g/dL (ref 12.0–16.0)

## 2019-10-09 LAB — CBC
HCT: 41.5 % (ref 36.0–46.0)
Hemoglobin: 13.2 g/dL (ref 12.0–15.0)
MCH: 26 pg (ref 26.0–34.0)
MCHC: 31.8 g/dL (ref 30.0–36.0)
MCV: 81.7 fL (ref 80.0–100.0)
Platelets: 253 10*3/uL (ref 150–400)
RBC: 5.08 MIL/uL (ref 3.87–5.11)
RDW: 15.7 % — ABNORMAL HIGH (ref 11.5–15.5)
WBC: 4.2 10*3/uL (ref 4.0–10.5)
nRBC: 0 % (ref 0.0–0.2)

## 2019-10-09 SURGERY — ECHOCARDIOGRAM, TRANSESOPHAGEAL
Anesthesia: Monitor Anesthesia Care

## 2019-10-09 MED ORDER — INFLUENZA VAC SPLIT QUAD 0.5 ML IM SUSY
0.5000 mL | PREFILLED_SYRINGE | INTRAMUSCULAR | Status: AC
  Administered 2019-10-10: 0.5 mL via INTRAMUSCULAR
  Filled 2019-10-09: qty 0.5

## 2019-10-09 MED ORDER — PROPOFOL 500 MG/50ML IV EMUL
INTRAVENOUS | Status: DC | PRN
  Administered 2019-10-09: 75 ug/kg/min via INTRAVENOUS

## 2019-10-09 MED ORDER — NOREPINEPHRINE 4 MG/250ML-% IV SOLN
INTRAVENOUS | Status: DC | PRN
  Administered 2019-10-09: 3 ug/min via INTRAVENOUS

## 2019-10-09 MED ORDER — PHENYLEPHRINE HCL (PRESSORS) 10 MG/ML IV SOLN
INTRAVENOUS | Status: DC | PRN
  Administered 2019-10-09 (×4): 80 ug via INTRAVENOUS

## 2019-10-09 MED ORDER — DEXMEDETOMIDINE HCL 200 MCG/2ML IV SOLN
INTRAVENOUS | Status: DC | PRN
  Administered 2019-10-09 (×2): 4 ug via INTRAVENOUS
  Administered 2019-10-09 (×2): 8 ug via INTRAVENOUS

## 2019-10-09 MED ORDER — GADOBUTROL 1 MMOL/ML IV SOLN
10.0000 mL | Freq: Once | INTRAVENOUS | Status: AC | PRN
  Administered 2019-10-09: 10 mL via INTRAVENOUS

## 2019-10-09 MED ORDER — POTASSIUM CHLORIDE CRYS ER 20 MEQ PO TBCR
40.0000 meq | EXTENDED_RELEASE_TABLET | Freq: Once | ORAL | Status: AC
  Administered 2019-10-09: 40 meq via ORAL
  Filled 2019-10-09 (×2): qty 2

## 2019-10-09 MED ORDER — LIDOCAINE 2% (20 MG/ML) 5 ML SYRINGE
INTRAMUSCULAR | Status: DC | PRN
  Administered 2019-10-09: 80 mg via INTRAVENOUS

## 2019-10-09 MED ORDER — EPHEDRINE SULFATE 50 MG/ML IJ SOLN
INTRAMUSCULAR | Status: DC | PRN
  Administered 2019-10-09 (×2): 5 mg via INTRAVENOUS

## 2019-10-09 MED ORDER — VASOPRESSIN 20 UNIT/ML IV SOLN
INTRAVENOUS | Status: DC | PRN
  Administered 2019-10-09: 1 [IU] via INTRAVENOUS

## 2019-10-09 MED FILL — Heparin Sodium (Porcine) Inj 1000 Unit/ML: INTRAMUSCULAR | Qty: 10 | Status: AC

## 2019-10-09 NOTE — Anesthesia Postprocedure Evaluation (Signed)
Anesthesia Post Note  Patient: Jlyn Wanjiku Coomer  Procedure(s) Performed: TRANSESOPHAGEAL ECHOCARDIOGRAM (TEE) (N/A )     Patient location during evaluation: PACU Anesthesia Type: MAC Level of consciousness: awake and alert Pain management: pain level controlled Vital Signs Assessment: post-procedure vital signs reviewed and stable Respiratory status: spontaneous breathing Cardiovascular status: stable Anesthetic complications: no Comments: Patient profoundly hypotensive after procedure complete. Started on norepinephrine gtt and given vasopressin boluses. Taken to recover in endoscopy. I continued to manage her BP post procedure, but she remained very sleepy and would drop to 10-62I systolic off of norepi. Several attempts were made to wean without success. Discussed with Dr. Aundra Dubin and he had her transferred to [facility]. In the interim, I was able to wean the norepi and her BP remained in 94W systolic. In my opinion, this was most likely due to precedex. Her BP improved with her improving level of consciousness.    No complications documented.  Last Vitals:  Vitals:   10/09/19 1140 10/09/19 1200  BP: (!) 83/57 101/77  Pulse: 89 95  Resp: (!) 29 (!) 24  Temp:  36.7 C  SpO2: 98% 98%    Last Pain:  Vitals:   10/09/19 1200  TempSrc: Oral  PainSc:                  Nolon Nations

## 2019-10-09 NOTE — Progress Notes (Signed)
PICC line placed, but the writer is unable to measure CVP per order. When the CVP  Cord is connected to the transducer it gives and error message.

## 2019-10-09 NOTE — CV Procedure (Signed)
Procedure: TEE  Indication: Aortic valve peri-valvular leakage  Sedation: Per anesthesiology.   Findings: Please see echo section for full report.  Mildly dilated left ventricle with EF 25%, diffuse hypokinesis.  Normal RV size with moderately decreased systolic function.  S/p tricuspid valve repair with no stenosis, mild TR.  Peak RV-RA gradient 34 mmHg.  No vegetation on TV.  Mildly dilated left atrium, no LA appendage thrombus.  Normal right atrium.  No PFO or ASD by color doppler.  Mild mitral regurgitation, no MV vegetation noted.  There was a bioprosthetic aortic valve, mean gradient 15 mmHg.  No vegetation noted on the aortic valve.  There was peri-valvular leakage that traced a serpiginous course from the aortic root to the left ventricle, possibly moderate in severity.  Normal caliber ascending aorta with no plaque.   Impression: Peri-valvular leakage from aortic root to LV, suspect moderate in severity.   Lauren Reyes 10/09/2019 8:36 AM

## 2019-10-09 NOTE — Progress Notes (Signed)
Patient ID: Lauren Reyes, female   DOB: October 29, 1990, 29 y.o.   MRN: [id number]     Advanced Heart Failure Rounding Note  PCP-Cardiologist: Armanda Magic, MD   Subjective:    Good diuresis yesterday, weight down 5 lbs.  Remains on milrinone 0.25 with co-ox 69%.  Creatinine stable.   TEE: EF 25%, moderate RV dysfunction, bioprosthetic aortic valve with mean gradient 15 but peri-valvular leakage from aortic root to LV, serpiginous path.  Suspect moderate degree of regurgitation.    Objective:   Weight Range: 88.3 kg Body mass index is 30.48 kg/m.   Vital Signs:   Temp:  [97.6 F (36.4 C)-98.3 F (36.8 C)] 97.6 F (36.4 C) (10/01 0708) Pulse Rate:  [58-114] 101 (10/01 0708) Resp:  [4-120] 17 (10/01 0708) BP: (94-154)/(60-125) 97/67 (10/01 0708) SpO2:  [95 %-100 %] 98 % (10/01 0708) Weight:  [88.3 kg-90.5 kg] 88.3 kg (10/01 0507) Last BM Date: 10/08/19  Weight change: Filed Weights   10/08/19 1048 10/08/19 1453 10/09/19 0507  Weight: 90.3 kg 90.5 kg 88.3 kg    Intake/Output:   Intake/Output Summary (Last 24 hours) at 10/09/2019 0843 Last data filed at 10/09/2019 0530 Gross per 24 hour  Intake 219.77 ml  Output 4000 ml  Net -3780.23 ml      Physical Exam    General:  Well appearing. No resp difficulty HEENT: Normal Neck: Supple. JVP 14 cm. Carotids 2+ bilat; no bruits. No lymphadenopathy or thyromegaly appreciated. Cor: PMI nondisplaced. Regular rate & rhythm. 2/6 SEM RUSB.  Lungs: Clear Abdomen: Soft, nontender, nondistended. No hepatosplenomegaly. No bruits or masses. Good bowel sounds. Extremities: No cyanosis, clubbing, rash, edema Neuro: Alert & orientedx3, cranial nerves grossly intact. moves all 4 extremities w/o difficulty. Affect pleasant   Telemetry   NSR 90s (personally reviewed).   Labs    CBC Recent Labs    10/08/19 1648 10/09/19 0420  WBC 4.7 4.2  NEUTROABS 1.1*  --   HGB 14.5 13.2  HCT 46.7* 41.5  MCV 83.2 81.7  PLT 276 253    Basic Metabolic Panel Recent Labs    67/12/45 1648 10/09/19 0420  NA 138 139  K 3.9 3.7  CL 105 103  CO2 22 26  GLUCOSE 118* 93  BUN 12 12  CREATININE 1.04* 1.08*  CALCIUM 9.0 8.4*   Liver Function Tests Recent Labs    10/08/19 1648  AST 25  ALT 41  ALKPHOS 87  BILITOT 1.1  PROT 7.0  ALBUMIN 3.7   No results for input(s): LIPASE, AMYLASE in the last 72 hours. Cardiac Enzymes No results for input(s): CKTOTAL, CKMB, CKMBINDEX, TROPONINI in the last 72 hours.  BNP: BNP (last 3 results) Recent Labs    04/15/19 2044 09/30/19 1135 10/08/19 1648  BNP 590.1* 2,906.1* 1,144.6*    ProBNP (last 3 results) No results for input(s): PROBNP in the last 8760 hours.   D-Dimer No results for input(s): DDIMER in the last 72 hours. Hemoglobin A1C No results for input(s): HGBA1C in the last 72 hours. Fasting Lipid Panel No results for input(s): CHOL, HDL, LDLCALC, TRIG, CHOLHDL, LDLDIRECT in the last 72 hours. Thyroid Function Tests Recent Labs    10/08/19 1648  TSH 6.541*    Other results:   Imaging    DG Chest 2 View  Result Date: 10/09/2019 CLINICAL DATA:  Congestive heart failure EXAM: CHEST - 2 VIEW COMPARISON:  07/08/2019 FINDINGS: Postoperative changes in the mediastinum. Aortic valve prosthesis. Cardiac enlargement with prominent left pulmonary  outflow tract. Mild vascular congestion. Mild interstitial edema. Small bilateral pleural effusions. No pneumothorax. Inferior vena caval filter. Right PICC line with tip over the cavoatrial junction region. IMPRESSION: Cardiac enlargement with pulmonary vascular congestion, interstitial edema, and small bilateral pleural effusions. Electronically Signed   By: Burman Nieves M.D.   On: 10/09/2019 06:47   CARDIAC CATHETERIZATION  Result Date: 10/08/2019 1. Significantly elevated right and left heart filling pressures. 2. Primarily pulmonary venous hypertension. 3. Low cardiac output. 4. No significant coronary  disease. 5. Fistulous connection from the right coronary cusp of the aortic root to the left ventricle. Admit for diuresis, will start milrinone.  She will need TEE to better assess the aortic root => LV fistula.   [place] EKG SITE RITE  Result Date: 10/08/2019 If Site Rite image not attached, placement could not be confirmed due to current cardiac rhythm.     Medications:     Scheduled Medications: . [MAR Hold] Chlorhexidine Gluconate Cloth  6 each Topical Daily  . [MAR Hold] clopidogrel  75 mg Oral Daily  . [MAR Hold] digoxin  0.125 mg Oral Daily  . [MAR Hold] enoxaparin (LOVENOX) injection  40 mg Subcutaneous Q24H  . [MAR Hold] escitalopram  10 mg Oral Daily  . [MAR Hold] furosemide  80 mg Intravenous BID  . [START ON 10/10/2019] influenza vac split quadrivalent PF  0.5 mL Intramuscular Tomorrow-1000  . [MAR Hold] sodium chloride flush  10-40 mL Intracatheter Q12H  . [MAR Hold] sodium chloride flush  3 mL Intravenous Q12H  . [MAR Hold] sodium chloride flush  3 mL Intravenous Q12H  . [MAR Hold] spironolactone  12.5 mg Oral Daily     Infusions: . [MAR Hold] sodium chloride    . [MAR Hold] sodium chloride    . sodium chloride 20 mL/hr at 10/09/19 0713  . sodium chloride 100 mL/hr at 10/09/19 0830  . milrinone 0.25 mcg/kg/min (10/09/19 0750)     PRN Medications:  [MAR Hold] sodium chloride, [MAR Hold] sodium chloride, [MAR Hold] acetaminophen, [MAR Hold] ondansetron (ZOFRAN) IV, [MAR Hold] sodium chloride flush, [MAR Hold] sodium chloride flush, [MAR Hold] sodium chloride flush   Assessment/Plan   1. Acute on chronic systolic CHF: 9/21 echo showed no vegetations but EF was down to 20-25% with severely decreased RV systolic function, moderate MR, moderate TR s/p TV repair, bioprosthetic AoV functioning normally (but there is evidence of peri-valvular leakage). Of note, intra-op TEE at time of valve surgery showed normal EF. She did not have a post-op echo until 9/21. Now with  biventricular failure. Cause is uncertain, ?viral myocarditis ("cold" in 8/21), ?LV damage from severe aortic insufficiency now more obvious s/p AVR (but would not explain severe RV dysfunction), ?volume load from aortic root => LV fistula. On exam, she is volume overloaded and RHC showed significantly elevated PCWP and RA pressure.  Cardiac index was low at 1.6. NYHA class IIIb symptoms. She is doing better on milrinone, co-ox 69%.  Still with volume overload but diuresed well on IV Lasix.   - Continue milrinone 0.25 mcg/kg/min with low output.  - Continue Lasix 80 mg IV bid.  - Hold Entresto this morning with low BP after TEE, hopefully restart later on.   - Continue spironolactone 12.5 daily.  - Digoxin 0.125 daily.  - Cardiac MRI will be ordered.   2. H/o endocarditis: Culture negative. S/p bioprosthetic aortic valve replacement and TV repair. 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 TEE did not appear to show active vegetation.  - Sent blood cultures, NGTD.  3. Bicuspid aortic valve disorder: Now s/p bioprosthetic AVR. Thoracic aorta not dilated on 3/21 CT.  4. H/o R CFV DVT: Has IVC filter, think ok to remove at this point.  5. H/o CVA: Had left MCA revascularization by IR, complicated by left ICA dissection and stent placement.  - On ASA 81 + Plavix for stent.  6. Polysubstance abuse: No drugs, ETOH, or smoking since 3/21. 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  Suspect volume load is worsening CHF.  Suspect this is a sequelae of prior endocarditis.  No fever, blood cultures so far negative.  TEE showed aortic root to LV peri-valvular leakage with a serpiginous course, suspect moderate in severity.  - May be candidate for transcatheter plug placement. Will review with Dr. Excell Seltzer.   Length of Stay: 1  Marca Ancona, MD  10/09/2019, 8:43 AM  Advanced Heart Failure Team Pager [phone] (M-F; 7a - 4p)  Please contact [facility] for  night-coverage after hours (4p -7a ) and weekends on [email or web address]

## 2019-10-09 NOTE — Interval H&P Note (Signed)
History and Physical Interval Note:  10/09/2019 7:59 AM  Lauren Reyes  has presented today for surgery, with the diagnosis of fistula to left ventricle.  The various methods of treatment have been discussed with the patient and family. After consideration of risks, benefits and other options for treatment, the patient has consented to  Procedure(s): TRANSESOPHAGEAL ECHOCARDIOGRAM (TEE) (N/A) as a surgical intervention.  The patient's history has been reviewed, patient examined, no change in status, stable for surgery.  I have reviewed the patient's chart and labs.  Questions were answered to the patient's satisfaction.     Dayra Rapley [employer]

## 2019-10-09 NOTE — Transfer of Care (Signed)
Immediate Anesthesia Transfer of Care Note  Patient: Lauren Reyes  Procedure(s) Performed: TRANSESOPHAGEAL ECHOCARDIOGRAM (TEE) (N/A )  Patient Location: Endoscopy Unit  Anesthesia Type:MAC  Level of Consciousness: awake, patient cooperative and responds to stimulation  Airway & Oxygen Therapy: Patient Spontanous Breathing and Patient connected to nasal cannula oxygen  Post-op Assessment: Report given to RN and Post -op Vital signs reviewed and stable  Post vital signs: Reviewed and stable  Last Vitals:  Vitals Value Taken Time  BP    Temp    Pulse 92 10/09/19 0858  Resp 26 10/09/19 0858  SpO2 92 % 10/09/19 0858    Last Pain:  Vitals:   10/09/19 0708  TempSrc: Oral  PainSc: 0-No pain         Complications: No complications documented.

## 2019-10-09 NOTE — Progress Notes (Signed)
  Echocardiogram TEE has been performed.  Lauren Reyes 10/09/2019, 8:49 AM

## 2019-10-10 ENCOUNTER — Encounter (HOSPITAL_COMMUNITY): Payer: Self-pay | Admitting: Cardiology

## 2019-10-10 DIAGNOSIS — T8203XA Leakage of heart valve prosthesis, initial encounter: Secondary | ICD-10-CM

## 2019-10-10 LAB — CBC
HCT: 42.3 % (ref 36.0–46.0)
Hemoglobin: 13.4 g/dL (ref 12.0–15.0)
MCH: 25.7 pg — ABNORMAL LOW (ref 26.0–34.0)
MCHC: 31.7 g/dL (ref 30.0–36.0)
MCV: 81 fL (ref 80.0–100.0)
Platelets: 269 10*3/uL (ref 150–400)
RBC: 5.22 MIL/uL — ABNORMAL HIGH (ref 3.87–5.11)
RDW: 15.6 % — ABNORMAL HIGH (ref 11.5–15.5)
WBC: 5.4 10*3/uL (ref 4.0–10.5)
nRBC: 0 % (ref 0.0–0.2)

## 2019-10-10 LAB — COOXEMETRY PANEL
Carboxyhemoglobin: 1.3 % (ref 0.5–1.5)
Methemoglobin: 0.8 % (ref 0.0–1.5)
O2 Saturation: 77.8 %
Total hemoglobin: 14 g/dL (ref 12.0–16.0)

## 2019-10-10 LAB — BASIC METABOLIC PANEL
Anion gap: 12 (ref 5–15)
BUN: 12 mg/dL (ref 6–20)
CO2: 23 mmol/L (ref 22–32)
Calcium: 8.4 mg/dL — ABNORMAL LOW (ref 8.9–10.3)
Chloride: 102 mmol/L (ref 98–111)
Creatinine, Ser: 1.01 mg/dL — ABNORMAL HIGH (ref 0.44–1.00)
GFR calc Af Amer: >60 mL/min (ref >60)
GFR calc non Af Amer: >60 mL/min (ref >60)
Glucose, Bld: 98 mg/dL (ref 70–99)
Potassium: 3.6 mmol/L (ref 3.5–5.1)
Sodium: 137 mmol/L (ref 135–145)

## 2019-10-10 MED ORDER — POTASSIUM CHLORIDE CRYS ER 20 MEQ PO TBCR
40.0000 meq | EXTENDED_RELEASE_TABLET | Freq: Once | ORAL | Status: AC
  Administered 2019-10-10: 40 meq via ORAL
  Filled 2019-10-10: qty 2

## 2019-10-10 MED ORDER — ASPIRIN 81 MG PO CHEW
81.0000 mg | CHEWABLE_TABLET | Freq: Every day | ORAL | Status: DC
  Administered 2019-10-10 – 2019-10-13 (×4): 81 mg via ORAL
  Filled 2019-10-10 (×4): qty 1

## 2019-10-10 MED ORDER — NOREPINEPHRINE 4 MG/250ML-% IV SOLN
2.0000 ug/min | INTRAVENOUS | Status: DC
  Administered 2019-10-10: 7 ug/min via INTRAVENOUS
  Administered 2019-10-10: 2 ug/min via INTRAVENOUS
  Administered 2019-10-11: 4 ug/min via INTRAVENOUS
  Filled 2019-10-10 (×2): qty 250

## 2019-10-10 NOTE — Consult Note (Signed)
[facility]   MULTIDISCIPLINARY HEART VALVE TEAM  Date:  10/10/2019   ID:  Lauren Reyes, DOB [birth year], MRN [id number]  PCP:  Patient, No Pcp Per   CC: Paravalvular Leak   HISTORY OF PRESENT ILLNESS: Lauren Reyes is a 29 y.o. female who presents for evaluation of paravalvular leak, referred by Dr Shirlee Latch.  The patient has a history of polysubstance abuse and developed endocarditis.  She has a quite complicated history but ultimately developed multi valve endocarditis complicated by septic emboli.  In Lauren Reyes 2021 she underwent bioprosthetic aortic valve replacement as well as debridement and repair of the tricuspid valve.  She had a prolonged course and was ultimately discharged home in June 2021.  She is now admitted with acute on chronic systolic heart failure and noted to have very severe LV dysfunction with LVEF 20 to 25% as well as severely depressed RV function.  Because of signs of low output heart failure, the patient is admitted to the hospital.  Her cardiac catheterization demonstrated paravalvular regurgitation with a serpiginous track from the aorta to the left ventricle located near the right coronary artery ostium.  She has subsequently undergone transesophageal echo and structural heart consultation is requested to evaluate her for paravalvular leak closure.  The patient is currently on IV milrinone.  She states that she is feeling better.  Her breathing has improved.  She has no chest pain at present.   Past Medical History:  Diagnosis Date  . Acute ischemic left MCA stroke (HCC) 04/01/2019  . Cerebrovascular accident (CVA) due to embolism of precerebral artery (HCC)   . DVT (deep venous thrombosis) (HCC)   . Encounter for central line placement   . Endocarditis 04/28/2019  . Endotracheal tube present   . Infective endocarditis   . IV drug user   . IVDU (intravenous drug user) 04/07/2019  . Middle cerebral artery embolism, left 04/01/2019  .  Normocytic anemia 04/07/2019  . Polysubstance abuse (HCC)   . Tachypnea   . Tobacco use     Current Facility-Administered Medications  Medication Dose Route Frequency Provider Last Rate Last Admin  . 0.9 %  sodium chloride infusion  250 mL Intravenous PRN Robbie Lis M, PA-C      . 0.9 %  sodium chloride infusion  250 mL Intravenous PRN Sharol Harness, Brittainy M, PA-C      . acetaminophen (TYLENOL) tablet 650 mg  650 mg Oral Q4H PRN Robbie Lis M, PA-C      . aspirin chewable tablet 81 mg  81 mg Oral Daily Laurey Morale, MD   81 mg at 10/10/19 0926  . Chlorhexidine Gluconate Cloth 2 % PADS 6 each  6 each Topical Daily Allayne Butcher, PA-C   6 each at 10/10/19 4403  . clopidogrel (PLAVIX) tablet 75 mg  75 mg Oral Daily Robbie Lis M, PA-C   75 mg at 10/10/19 4742  . digoxin (LANOXIN) tablet 0.125 mg  0.125 mg Oral Daily Robbie Lis M, PA-C   0.125 mg at 10/10/19 5956  . enoxaparin (LOVENOX) injection 40 mg  40 mg Subcutaneous Q24H Robbie Lis M, PA-C   40 mg at 10/10/19 0758  . escitalopram (LEXAPRO) tablet 10 mg  10 mg Oral Daily Robbie Lis M, PA-C   10 mg at 10/10/19 0919  . furosemide (LASIX) injection 80 mg  80 mg Intravenous BID Robbie Lis M, PA-C   80 mg at 10/10/19 0758  . milrinone (PRIMACOR) 20 MG/100 ML (  0.2 mg/mL) infusion  0.125 mcg/kg/min Intravenous Continuous Laurey Morale, MD 3.39 mL/hr at 10/10/19 1028 0.125 mcg/kg/min at 10/10/19 1028  . norepinephrine (LEVOPHED) 4mg  in premix infusion  2-50 mcg/min Intravenous Titrated , MD 26.3 mL/hr at 10/10/19 1300 7 mcg/min at 10/10/19 1300  . ondansetron (ZOFRAN) injection 4 mg  4 mg Intravenous Q6H PRN 12/10/19 M, PA-C      . potassium chloride SA (KLOR-CON) CR tablet 40 mEq  40 mEq Oral Once M, RPH      . sodium chloride flush (NS) 0.9 % injection 10-40 mL  10-40 mL Intracatheter Q12H 06-30-1996 M, PA-C   10 mL at 10/10/19 0919   . sodium chloride flush (NS) 0.9 % injection 10-40 mL  10-40 mL Intracatheter PRN 06-30-1996 M, PA-C      . sodium chloride flush (NS) 0.9 % injection 3 mL  3 mL Intravenous Q12H Simmons, Brittainy M, PA-C   3 mL at 10/08/19 1524  . sodium chloride flush (NS) 0.9 % injection 3 mL  3 mL Intravenous PRN 10/10/19 M, PA-C      . sodium chloride flush (NS) 0.9 % injection 3 mL  3 mL Intravenous Q12H M M, PA-C   3 mL at 10/10/19 0919  . sodium chloride flush (NS) 0.9 % injection 3 mL  3 mL Intravenous PRN 12/10/19 M, PA-C      . spironolactone (ALDACTONE) tablet 12.5 mg  12.5 mg Oral Daily M M, PA-C   12.5 mg at 10/10/19 12/10/19    ALLERGIES:   Patient has no known allergies.   SOCIAL HISTORY:  The patient  reports that she has quit smoking. She has never used smokeless tobacco.   FAMILY HISTORY:  The patient's family history includes Heart murmur in her brother.   REVIEW OF SYSTEMS: Negative except as outlined above  PHYSICAL EXAM: VS:  BP (!) 94/49   Pulse (!) 102   Temp 97.7 F (36.5 C)   Resp (!) 21   Ht 5\' 7"  (1.702 m)   Wt 86.5 kg   SpO2 100%   BMI 29.85 kg/m  , BMI Body mass index is 29.85 kg/m. GEN: Well nourished, well developed woman in no acute distress HEENT: normal Neck: JVP moderately elevated Cardiac: The heart is RRR with 2/6 systolic ejection murmur at the right upper sternal border, no diastolic murmur appreciated. No edema. Pedal pulses 2+ = bilaterally  Respiratory:  clear to auscultation bilaterally GI: soft, nontender, nondistended, + BS MS: no deformity or atrophy Skin: warm and dry, no rash Neuro:  Strength and sensation are intact Psych: euthymic mood, full affect   RECENT LABS: 05/20/2019: Magnesium 1.8 10/08/2019: ALT 41; B Natriuretic Peptide 1,144.6; TSH 6.541 10/10/2019: BUN 12; Creatinine, Ser 1.01; Hemoglobin 13.4; Platelets 269; Potassium 3.6; Sodium 137  04/02/2019: Cholesterol 163; HDL  <10; LDL Cholesterol NOT CALCULATED; Total CHOL/HDL Ratio NOT CALCULATED; Triglycerides 258; VLDL 52 04/03/2019: Direct LDL 42.9   Estimated Creatinine Clearance: 92.1 mL/min (A) (by C-G formula based on SCr of 1.01 mg/dL (H)).   Wt Readings from Last 3 Encounters:  10/10/19 86.5 kg  09/30/19 94.9 kg  08/31/19 86.2 kg     CARDIAC STUDIES Cardiac Cath: Conclusion  1. Significantly elevated right and left heart filling pressures.  2. Primarily pulmonary venous hypertension.  3. Low cardiac output.  4. No significant coronary disease.  5. Fistulous connection from the right coronary cusp of the  aortic root to the left ventricle.   Admit for diuresis, will start milrinone.  She will need TEE to better assess the aortic root => LV fistula.    TEE: Findings: Please see echo section for full report.  Mildly dilated left ventricle with EF 25%, diffuse hypokinesis.  Normal RV size with moderately decreased systolic function.  S/p tricuspid valve repair with no stenosis, mild TR.  Peak RV-RA gradient 34 mmHg.  No vegetation on TV.  Mildly dilated left atrium, no LA appendage thrombus.  Normal right atrium.  No PFO or ASD by color doppler.  Mild mitral regurgitation, no MV vegetation noted.  There was a bioprosthetic aortic valve, mean gradient 15 mmHg.  No vegetation noted on the aortic valve.  There was peri-valvular leakage that traced a serpiginous course from the aortic root to the left ventricle, possibly moderate in severity.  Normal caliber ascending aorta with no plaque.    Impression: Peri-valvular leakage from aortic root to LV, suspect moderate in severity.   ASSESSMENT AND PLAN: 1.  Patient with prior multivalve endocarditis who is about 6 months out from bioprosthetic aortic valve replacement is found to have paravalvular regurgitation.  I have personally reviewed her cardiac catheterization images and transesophageal echo images with Dr. Shirlee Latch.  She appears to have an open space  that is the nidus for her paravalvular regurgitation around the junction of the noncoronary and right coronary sinus of the aortic valve.  While it is difficult to quantify because of jet eccentricity, I do not think she has more than moderate paravalvular regurgitation.  She has no evidence of widened pulse pressure diastolic heart murmur, or hemolysis.  I doubt that the patient's aortic regurgitation has played a significant role in her presentation with acute on chronic biventricular heart failure.  Discussed findings with the patient and would recommend serial echo imaging to assess for any worsening of her paravalvular leak.  I would be happy to see her in the future if problems arise.  Enzo Bi 10/10/2019 1:19 PM     [facility] [address] [place] [address]  [phone] (office) [phone] (fax)

## 2019-10-10 NOTE — Progress Notes (Addendum)
Patient ID: Lauren Reyes, female   DOB: 1990/03/27, 29 y.o.   MRN: [id number]     Advanced Heart Failure Rounding Note  PCP-Cardiologist: Armanda Magic, MD   Subjective:    Good diuresis yesterday per patient but I/Os not recorded, weight down 4 lbs.  BP was low after TEE yesterday, started on NE and milrinone stopped.  Currently, SBP 110s on NE 4. CVP 12, co-ox 78%.    She is breathing better, able to walk around the unit.   TEE: EF 25%, moderate RV dysfunction, bioprosthetic aortic valve with mean gradient 15 but peri-valvular leakage from aortic root to LV, serpiginous path.  Suspect moderate degree of regurgitation.    Objective:   Weight Range: 86.5 kg Body mass index is 29.85 kg/m.   Vital Signs:   Temp:  [97.6 F (36.4 C)-98.2 F (36.8 C)] 98.2 F (36.8 C) (10/02 0721) Pulse Rate:  [76-119] 81 (10/02 0600) Resp:  [14-32] 27 (10/02 0600) BP: (65-117)/(35-89) 117/87 (10/02 0600) SpO2:  [92 %-100 %] 99 % (10/02 0600) Weight:  [86.5 kg] 86.5 kg (10/02 0500) Last BM Date: 10/08/19  Weight change: Filed Weights   10/08/19 1453 10/09/19 0507 10/10/19 0500  Weight: 90.5 kg 88.3 kg 86.5 kg    Intake/Output:   Intake/Output Summary (Last 24 hours) at 10/10/2019 0800 Last data filed at 10/09/2019 1300 Gross per 24 hour  Intake --  Output 200 ml  Net -200 ml      Physical Exam    General: NAD Neck: JVP 10-12 cm, no thyromegaly or thyroid nodule.  Lungs: Clear to auscultation bilaterally with normal respiratory effort. CV: Nondisplaced PMI.  Heart regular S1/S2, no S3/S4, 3/6 SEM RUSB.  No peripheral edema.   Abdomen: Soft, nontender, no hepatosplenomegaly, no distention.  Skin: Intact without lesions or rashes.  Neurologic: Alert and oriented x 3.  Psych: Normal affect. Extremities: No clubbing or cyanosis.  HEENT: Normal.    Telemetry   NSR 90s (personally reviewed).   Labs    CBC Recent Labs    10/08/19 1648 10/08/19 1648 10/09/19 0420  10/10/19 0500  WBC 4.7   < > 4.2 5.4  NEUTROABS 1.1*  --   --   --   HGB 14.5   < > 13.2 13.4  HCT 46.7*   < > 41.5 42.3  MCV 83.2   < > 81.7 81.0  PLT 276   < > 253 269   < > = values in this interval not displayed.   Basic Metabolic Panel Recent Labs    40/98/11 0420 10/10/19 0500  NA 139 137  K 3.7 3.6  CL 103 102  CO2 26 23  GLUCOSE 93 98  BUN 12 12  CREATININE 1.08* 1.01*  CALCIUM 8.4* 8.4*   Liver Function Tests Recent Labs    10/08/19 1648  AST 25  ALT 41  ALKPHOS 87  BILITOT 1.1  PROT 7.0  ALBUMIN 3.7   No results for input(s): LIPASE, AMYLASE in the last 72 hours. Cardiac Enzymes No results for input(s): CKTOTAL, CKMB, CKMBINDEX, TROPONINI in the last 72 hours.  BNP: BNP (last 3 results) Recent Labs    04/15/19 2044 09/30/19 1135 10/08/19 1648  BNP 590.1* 2,906.1* 1,144.6*    ProBNP (last 3 results) No results for input(s): PROBNP in the last 8760 hours.   D-Dimer No results for input(s): DDIMER in the last 72 hours. Hemoglobin A1C No results for input(s): HGBA1C in the last 72 hours. Fasting Lipid  Panel No results for input(s): CHOL, HDL, LDLCALC, TRIG, CHOLHDL, LDLDIRECT in the last 72 hours. Thyroid Function Tests Recent Labs    10/08/19 1648  TSH 6.541*    Other results:   Imaging    No results found.   Medications:     Scheduled Medications: . Chlorhexidine Gluconate Cloth  6 each Topical Daily  . clopidogrel  75 mg Oral Daily  . digoxin  0.125 mg Oral Daily  . enoxaparin (LOVENOX) injection  40 mg Subcutaneous Q24H  . escitalopram  10 mg Oral Daily  . furosemide  80 mg Intravenous BID  . influenza vac split quadrivalent PF  0.5 mL Intramuscular Tomorrow-1000  . potassium chloride  40 mEq Oral Once  . sodium chloride flush  10-40 mL Intracatheter Q12H  . sodium chloride flush  3 mL Intravenous Q12H  . sodium chloride flush  3 mL Intravenous Q12H  . spironolactone  12.5 mg Oral Daily    Infusions: . sodium  chloride    . sodium chloride    . milrinone 0.25 mcg/kg/min (10/09/19 0750)  . norepinephrine (LEVOPHED) Adult infusion 4 mcg/min (10/10/19 0215)    PRN Medications: sodium chloride, sodium chloride, acetaminophen, ondansetron (ZOFRAN) IV, sodium chloride flush, sodium chloride flush, sodium chloride flush   Assessment/Plan   1. Acute on chronic systolic CHF: 9/21 echo showed no vegetations but EF was down to 20-25% with severely decreased RV systolic function, moderate MR, moderate TR s/p TV repair, bioprosthetic AoV functioning normally (but there is evidence of peri-valvular leakage). Of note, intra-op TEE at time of valve surgery showed normal EF. She did not have a post-op echo until 9/21. Now with biventricular failure. Cause is uncertain, ?viral myocarditis ("cold" in 8/21), ?LV damage from severe aortic insufficiency now more obvious s/p AVR (but would not explain severe RV dysfunction), ?volume load from aortic root => LV fistula. On exam, she is volume overloaded and RHC showed significantly elevated PCWP and RA pressure.  Cardiac index was low at 1.6. She has diuresed well with IV Lasix, weight down.  Currently on NE 4 and off milrinone with hypotension post-TEE sedation.  BP stable this morning.  Co-ox 78%, CVP 12. - Restart milrinone 0.125 mcg/kg/min with low output and wean off NE for SBP 90 and above.  - Continue Lasix 80 mg IV bid today.   - Hold Entresto with low BP, hopefully restart later on.   - Continue spironolactone 12.5 daily.  - Digoxin 0.125 daily.  - Cardiac MRI done, will review.  2. H/o endocarditis: Culture negative. S/p bioprosthetic aortic valve replacement and TV repair. 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 TEE did not appear to show active vegetation.  - Sent blood cultures, NGTD.  3. Bicuspid aortic valve disorder: Now s/p bioprosthetic AVR. Thoracic aorta not dilated on 3/21 CT.  4. H/o R CFV DVT: Has IVC  filter, think ok to remove at this point.  5. H/o CVA: Had left MCA revascularization by IR, complicated by left ICA dissection and stent placement.  - On ASA 81 + Plavix for stent.  6. Polysubstance abuse: No drugs, ETOH, or smoking since 3/21. 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  Suspect volume load is worsening CHF.  Suspect this is a sequelae of prior endocarditis.  No fever, blood cultures so far negative.  TEE showed aortic root to LV peri-valvular leakage with a serpiginous course, suspect moderate in severity.  - May  be candidate for transcatheter plug placement. Will review with Dr. Excell Seltzer.   CRITICAL CARE Performed by: Marca Ancona  Total critical care time: 35 minutes  Critical care time was exclusive of separately billable procedures and treating other patients.  Critical care was necessary to treat or prevent imminent or life-threatening deterioration.  Critical care was time spent personally by me on the following activities: development of treatment plan with patient and/or surrogate as well as nursing, discussions with consultants, evaluation of patient's response to treatment, examination of patient, obtaining history from patient or surrogate, ordering and performing treatments and interventions, ordering and review of laboratory studies, ordering and review of radiographic studies, pulse oximetry and re-evaluation of patient's condition.   Length of Stay: 2  Marca Ancona, MD  10/10/2019, 8:00 AM  Advanced Heart Failure Team Pager [phone] (M-F; 7a - 4p)  Please contact [facility] for night-coverage after hours (4p -7a ) and weekends on [email or web address]

## 2019-10-10 NOTE — Progress Notes (Signed)
Cardiology Called by RN and informed that BP has been intermittently low, with SBP at times as low as the 70s.  Instructions as per HF team to hold milrinone in setting of hypotension; will hold the milrinone for now and reassess BP in 1-2hr. If BP remains low off milrinone, consider levophed (it appears pt required levophed earlier in the day today during TEE).  Precious Reel, MD , [facility] 10/10/19 1:56 AM

## 2019-10-11 ENCOUNTER — Encounter (HOSPITAL_COMMUNITY): Payer: Self-pay | Admitting: Cardiology

## 2019-10-11 LAB — CBC
HCT: 44.6 % (ref 36.0–46.0)
Hemoglobin: 14.2 g/dL (ref 12.0–15.0)
MCH: 25.8 pg — ABNORMAL LOW (ref 26.0–34.0)
MCHC: 31.8 g/dL (ref 30.0–36.0)
MCV: 80.9 fL (ref 80.0–100.0)
Platelets: 260 10*3/uL (ref 150–400)
RBC: 5.51 MIL/uL — ABNORMAL HIGH (ref 3.87–5.11)
RDW: 15.4 % (ref 11.5–15.5)
WBC: 4.8 10*3/uL (ref 4.0–10.5)
nRBC: 0 % (ref 0.0–0.2)

## 2019-10-11 LAB — BASIC METABOLIC PANEL
Anion gap: 9 (ref 5–15)
BUN: 14 mg/dL (ref 6–20)
CO2: 27 mmol/L (ref 22–32)
Calcium: 8.5 mg/dL — ABNORMAL LOW (ref 8.9–10.3)
Chloride: 101 mmol/L (ref 98–111)
Creatinine, Ser: 0.85 mg/dL (ref 0.44–1.00)
GFR calc Af Amer: >60 mL/min (ref >60)
GFR calc non Af Amer: >60 mL/min (ref >60)
Glucose, Bld: 107 mg/dL — ABNORMAL HIGH (ref 70–99)
Potassium: 3.8 mmol/L (ref 3.5–5.1)
Sodium: 137 mmol/L (ref 135–145)

## 2019-10-11 LAB — COOXEMETRY PANEL
Carboxyhemoglobin: 1 % (ref 0.5–1.5)
Methemoglobin: 0.7 % (ref 0.0–1.5)
O2 Saturation: 65.1 %
Total hemoglobin: 14.7 g/dL (ref 12.0–16.0)

## 2019-10-11 LAB — DIGOXIN LEVEL: Digoxin Level: 0.6 ng/mL — ABNORMAL LOW (ref 1.0–2.0)

## 2019-10-11 LAB — MAGNESIUM: Magnesium: 1.7 mg/dL (ref 1.7–2.4)

## 2019-10-11 LAB — MRSA PCR SCREENING: MRSA by PCR: NEGATIVE

## 2019-10-11 MED ORDER — FUROSEMIDE 40 MG PO TABS
40.0000 mg | ORAL_TABLET | Freq: Two times a day (BID) | ORAL | Status: DC
  Administered 2019-10-11 – 2019-10-12 (×3): 40 mg via ORAL
  Filled 2019-10-11 (×3): qty 1

## 2019-10-11 MED ORDER — POTASSIUM CHLORIDE CRYS ER 20 MEQ PO TBCR
40.0000 meq | EXTENDED_RELEASE_TABLET | Freq: Once | ORAL | Status: AC
  Administered 2019-10-11: 40 meq via ORAL
  Filled 2019-10-11: qty 2

## 2019-10-11 NOTE — Progress Notes (Signed)
Patient ID: Lauren Reyes, female   DOB: 11-Dec-1990, 29 y.o.   MRN: [id number]     Advanced Heart Failure Rounding Note  PCP-Cardiologist: Armanda Magic, MD   Subjective:    Good diuresis yesterday again, weight down 12 lbs.  She remains on milrinone 0.125 and NE 4.  CVP 5 today with co-ox 68%.    She is breathing better, able to walk around the unit.   TEE: EF 25%, moderate RV dysfunction, bioprosthetic aortic valve with mean gradient 15 but peri-valvular leakage from aortic root to LV, serpiginous path.  Suspect moderate degree of regurgitation.    Objective:   Weight Range: 80.9 kg Body mass index is 27.93 kg/m.   Vital Signs:   Temp:  [97.5 F (36.4 C)-98.2 F (36.8 C)] 97.7 F (36.5 C) (10/03 0727) Pulse Rate:  [83-121] 85 (10/03 0700) Resp:  [11-34] 22 (10/03 0700) BP: (72-140)/(47-116) 93/69 (10/03 0700) SpO2:  [95 %-100 %] 98 % (10/03 0700) Weight:  [80.9 kg] 80.9 kg (10/03 0600) Last BM Date: 10/10/19  Weight change: Filed Weights   10/09/19 0507 10/10/19 0500 10/11/19 0600  Weight: 88.3 kg 86.5 kg 80.9 kg    Intake/Output:   Intake/Output Summary (Last 24 hours) at 10/11/2019 0756 Last data filed at 10/11/2019 0600 Gross per 24 hour  Intake 1668.37 ml  Output 6750 ml  Net -5081.63 ml      Physical Exam    General: NAD Neck: JVP not elevated, no thyromegaly or thyroid nodule.  Lungs: Clear to auscultation bilaterally with normal respiratory effort. CV: Nondisplaced PMI.  Heart regular S1/S2, no S3/S4,2/6 SEM RUSB.  No peripheral edema.    Abdomen: Soft, nontender, no hepatosplenomegaly, no distention.  Skin: Intact without lesions or rashes.  Neurologic: Alert and oriented x 3.  Psych: Normal affect. Extremities: No clubbing or cyanosis.  HEENT: Normal.    Telemetry   NSR 90s (personally reviewed).   Labs    CBC Recent Labs    10/08/19 1648 10/09/19 0420 10/10/19 0500 10/11/19 0431  WBC 4.7   < > 5.4 4.8  NEUTROABS 1.1*  --    --   --   HGB 14.5   < > 13.4 14.2  HCT 46.7*   < > 42.3 44.6  MCV 83.2   < > 81.0 80.9  PLT 276   < > 269 260   < > = values in this interval not displayed.   Basic Metabolic Panel Recent Labs    82/50/53 0500 10/11/19 0431  NA 137 137  K 3.6 3.8  CL 102 101  CO2 23 27  GLUCOSE 98 107*  BUN 12 14  CREATININE 1.01* 0.85  CALCIUM 8.4* 8.5*  MG  --  1.7   Liver Function Tests Recent Labs    10/08/19 1648  AST 25  ALT 41  ALKPHOS 87  BILITOT 1.1  PROT 7.0  ALBUMIN 3.7   No results for input(s): LIPASE, AMYLASE in the last 72 hours. Cardiac Enzymes No results for input(s): CKTOTAL, CKMB, CKMBINDEX, TROPONINI in the last 72 hours.  BNP: BNP (last 3 results) Recent Labs    04/15/19 2044 09/30/19 1135 10/08/19 1648  BNP 590.1* 2,906.1* 1,144.6*    ProBNP (last 3 results) No results for input(s): PROBNP in the last 8760 hours.   D-Dimer No results for input(s): DDIMER in the last 72 hours. Hemoglobin A1C No results for input(s): HGBA1C in the last 72 hours. Fasting Lipid Panel No results for input(s): CHOL,  HDL, LDLCALC, TRIG, CHOLHDL, LDLDIRECT in the last 72 hours. Thyroid Function Tests Recent Labs    10/08/19 1648  TSH 6.541*    Other results:   Imaging    No results found.   Medications:     Scheduled Medications: . aspirin  81 mg Oral Daily  . Chlorhexidine Gluconate Cloth  6 each Topical Daily  . clopidogrel  75 mg Oral Daily  . digoxin  0.125 mg Oral Daily  . enoxaparin (LOVENOX) injection  40 mg Subcutaneous Q24H  . escitalopram  10 mg Oral Daily  . furosemide  40 mg Oral BID  . potassium chloride  40 mEq Oral Once  . sodium chloride flush  10-40 mL Intracatheter Q12H  . sodium chloride flush  3 mL Intravenous Q12H  . sodium chloride flush  3 mL Intravenous Q12H  . spironolactone  12.5 mg Oral Daily    Infusions: . sodium chloride    . sodium chloride    . norepinephrine (LEVOPHED) Adult infusion 4 mcg/min (10/11/19 0626)      PRN Medications: sodium chloride, sodium chloride, acetaminophen, ondansetron (ZOFRAN) IV, sodium chloride flush, sodium chloride flush, sodium chloride flush   Assessment/Plan   1. Acute on chronic systolic CHF: 9/21 echo showed no vegetations but EF was down to 20-25% with severely decreased RV systolic function, moderate MR, moderate TR s/p TV repair, bioprosthetic AoV functioning normally (but there is evidence of peri-valvular leakage). Of note, intra-op TEE at time of valve surgery showed normal EF. She did not have a post-op echo until 9/21. Now with biventricular failure. Cause is uncertain, ?viral myocarditis ("cold" in 8/21), ?LV damage from severe aortic insufficiency now more obvious s/p AVR (but would not explain severe RV dysfunction), ?volume load from aortic root => LV fistula. On exam, she is volume overloaded and RHC showed significantly elevated PCWP and RA pressure.  Cardiac index was low at 1.6. She has diuresed well with IV Lasix, weight down.  Currently on NE 4 and milrinone 0.125.  BP stable this morning.  Co-ox 67%, CVP 5. - Stop milrinone, wean NE for SBP > 85.  - Stop IV Lasix, start Lasix 40 mg po bid this evening.    - Off Entresto for now with low BP.   - Continue spironolactone 12.5 daily.  - Digoxin 0.125 daily, level ok.  - Cardiac MRI done, will review.  2. H/o endocarditis: Culture negative. S/p bioprosthetic aortic valve replacement and TV repair. 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 TEE did not appear to show active vegetation.  - Sent blood cultures, NGTD.  3. Bicuspid aortic valve disorder: Now s/p bioprosthetic AVR. Thoracic aorta not dilated on 3/21 CT.  4. H/o R CFV DVT: Has IVC filter, think ok to remove at this point.  5. H/o CVA: Had left MCA revascularization by IR, complicated by left ICA dissection and stent placement.  - On ASA 81 + Plavix for stent.  6. Polysubstance abuse: No drugs, ETOH,  or smoking since 3/21. 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  Suspect this is a sequelae of prior endocarditis.  No fever, blood cultures so far negative.  TEE showed aortic root to LV peri-valvular leakage with a serpiginous course, suspect no more than moderate in severity.  Does not explain extent of her cardiomyopathy.  Discussed with Dr. Excell Seltzer, will follow for now with serial echoes.   CRITICAL CARE Performed by: Marca Ancona  Total  critical care time: 35 minutes  Critical care time was exclusive of separately billable procedures and treating other patients.  Critical care was necessary to treat or prevent imminent or life-threatening deterioration.  Critical care was time spent personally by me on the following activities: development of treatment plan with patient and/or surrogate as well as nursing, discussions with consultants, evaluation of patient's response to treatment, examination of patient, obtaining history from patient or surrogate, ordering and performing treatments and interventions, ordering and review of laboratory studies, ordering and review of radiographic studies, pulse oximetry and re-evaluation of patient's condition.   Length of Stay: 3  Marca Ancona, MD  10/11/2019, 7:56 AM  Advanced Heart Failure Team Pager [phone] (M-F; 7a - 4p)  Please contact [facility] for night-coverage after hours (4p -7a ) and weekends on [email or web address]

## 2019-10-12 LAB — BASIC METABOLIC PANEL
Anion gap: 8 (ref 5–15)
BUN: 23 mg/dL — ABNORMAL HIGH (ref 6–20)
CO2: 26 mmol/L (ref 22–32)
Calcium: 8.8 mg/dL — ABNORMAL LOW (ref 8.9–10.3)
Chloride: 101 mmol/L (ref 98–111)
Creatinine, Ser: 1.07 mg/dL — ABNORMAL HIGH (ref 0.44–1.00)
GFR calc Af Amer: >60 mL/min (ref >60)
GFR calc non Af Amer: >60 mL/min (ref >60)
Glucose, Bld: 87 mg/dL (ref 70–99)
Potassium: 4.1 mmol/L (ref 3.5–5.1)
Sodium: 135 mmol/L (ref 135–145)

## 2019-10-12 LAB — CBC
HCT: 43.9 % (ref 36.0–46.0)
Hemoglobin: 14.1 g/dL (ref 12.0–15.0)
MCH: 26.3 pg (ref 26.0–34.0)
MCHC: 32.1 g/dL (ref 30.0–36.0)
MCV: 81.9 fL (ref 80.0–100.0)
Platelets: 259 10*3/uL (ref 150–400)
RBC: 5.36 MIL/uL — ABNORMAL HIGH (ref 3.87–5.11)
RDW: 15.9 % — ABNORMAL HIGH (ref 11.5–15.5)
WBC: 4.4 10*3/uL (ref 4.0–10.5)
nRBC: 0 % (ref 0.0–0.2)

## 2019-10-12 LAB — COOXEMETRY PANEL
Carboxyhemoglobin: 1 % (ref 0.5–1.5)
Methemoglobin: 0.9 % (ref 0.0–1.5)
O2 Saturation: 77.5 %
Total hemoglobin: 14.4 g/dL (ref 12.0–16.0)

## 2019-10-12 MED ORDER — LOSARTAN POTASSIUM 25 MG PO TABS
12.5000 mg | ORAL_TABLET | Freq: Every day | ORAL | Status: DC
  Administered 2019-10-12: 12.5 mg via ORAL
  Filled 2019-10-12: qty 1

## 2019-10-12 MED ORDER — FUROSEMIDE 40 MG PO TABS: 40.0000 mg | ORAL_TABLET | Freq: Two times a day (BID) | ORAL | Status: DC

## 2019-10-12 NOTE — Plan of Care (Signed)
  Problem: Clinical Measurements: Goal: Ability to maintain clinical measurements within normal limits will improve Outcome: Progressing Goal: Diagnostic test results will improve Outcome: Progressing Goal: Respiratory complications will improve Outcome: Progressing Goal: Cardiovascular complication will be avoided Outcome: Progressing   Problem: Activity: Goal: Risk for activity intolerance will decrease Outcome: Progressing   Problem: Nutrition: Goal: Adequate nutrition will be maintained Outcome: Progressing   Problem: Elimination: Goal: Will not experience complications related to bowel motility Outcome: Progressing Goal: Will not experience complications related to urinary retention Outcome: Progressing   Problem: Safety: Goal: Ability to remain free from injury will improve Outcome: Progressing   Problem: Activity: Goal: Capacity to carry out activities will improve Outcome: Progressing

## 2019-10-12 NOTE — Progress Notes (Addendum)
Patient ID: Lauren Reyes, female   DOB: May 10, 1990, 29 y.o.   MRN: [id number]     Advanced Heart Failure Rounding Note  PCP-Cardiologist: Armanda Magic, MD   Subjective:    cMRI done yesterday. No LGE. No evidence of prior MI, infiltrative disease or myocarditis. LVEF 18%. RVEF 21%.   Co-ox stable off milrinone at 78%. BP soft but stable.   No complaints today. Denies dyspnea. Resting comfortably.    cMRI 10/3 IMPRESSION: 1.  Mildly dilated LV with EF 18%, diffuse hypokinesis. 2.  Mildly dilated RV with EF 21%. 3. Bioprosthetic aortic valve with perivalvular leakage noted, better seen on recent TEE. 4.  S/p tricuspid valve repair with mild TR. 5. Nonspecific small area of RV insertion site LGE. No definitive evidence for prior MI, infiltrative disease, or myocarditis.  TEE: EF 25%, moderate RV dysfunction, bioprosthetic aortic valve with mean gradient 15 but peri-valvular leakage from aortic root to LV, serpiginous path.  Suspect moderate degree of regurgitation.    Objective:   Weight Range: 80.9 kg Body mass index is 27.93 kg/m.   Vital Signs:   Temp:  [97.7 F (36.5 C)-98.2 F (36.8 C)] 97.7 F (36.5 C) (10/04 0600) Pulse Rate:  [83-125] 95 (10/03 2000) Resp:  [8-33] 17 (10/04 0700) BP: (71-135)/(45-114) 95/74 (10/04 0700) SpO2:  [94 %-100 %] 100 % (10/04 0600) Last BM Date: 10/10/19  Weight change: Filed Weights   10/09/19 0507 10/10/19 0500 10/11/19 0600  Weight: 88.3 kg 86.5 kg 80.9 kg    Intake/Output:   Intake/Output Summary (Last 24 hours) at 10/12/2019 0713 Last data filed at 10/12/2019 0600 Gross per 24 hour  Intake 1079.77 ml  Output 2600 ml  Net -1520.23 ml      Physical Exam    General:  Well appearing young female. No respiratory difficulty HEENT: normal Neck: supple. no JVD. Carotids 2+ bilat; no bruits. No lymphadenopathy or thyromegaly appreciated. Cor: PMI nondisplaced. Regular rate & rhythm. 2/6 SEM RUSB Lungs: clear, no  wheezing  Abdomen: soft, nontender, nondistended. No hepatosplenomegaly. No bruits or masses. Good bowel sounds. Extremities: no cyanosis, clubbing, rash, edema Neuro: alert & oriented x 3, cranial nerves grossly intact. moves all 4 extremities w/o difficulty. Affect pleasant.  Telemetry   NSR 80s (personally reviewed).   Labs    CBC Recent Labs    10/11/19 0431 10/12/19 0618  WBC 4.8 4.4  HGB 14.2 14.1  HCT 44.6 43.9  MCV 80.9 81.9  PLT 260 259   Basic Metabolic Panel Recent Labs    53/66/44 0431 10/12/19 0618  NA 137 135  K 3.8 4.1  CL 101 101  CO2 27 26  GLUCOSE 107* 87  BUN 14 23*  CREATININE 0.85 1.07*  CALCIUM 8.5* 8.8*  MG 1.7  --    Liver Function Tests No results for input(s): AST, ALT, ALKPHOS, BILITOT, PROT, ALBUMIN in the last 72 hours. No results for input(s): LIPASE, AMYLASE in the last 72 hours. Cardiac Enzymes No results for input(s): CKTOTAL, CKMB, CKMBINDEX, TROPONINI in the last 72 hours.  BNP: BNP (last 3 results) Recent Labs    04/15/19 2044 09/30/19 1135 10/08/19 1648  BNP 590.1* 2,906.1* 1,144.6*    ProBNP (last 3 results) No results for input(s): PROBNP in the last 8760 hours.   D-Dimer No results for input(s): DDIMER in the last 72 hours. Hemoglobin A1C No results for input(s): HGBA1C in the last 72 hours. Fasting Lipid Panel No results for input(s): CHOL, HDL, LDLCALC, TRIG,  CHOLHDL, LDLDIRECT in the last 72 hours. Thyroid Function Tests No results for input(s): TSH, T4TOTAL, T3FREE, THYROIDAB in the last 72 hours.  Invalid input(s): FREET3  Other results:   Imaging    No results found.   Medications:     Scheduled Medications: . aspirin  81 mg Oral Daily  . Chlorhexidine Gluconate Cloth  6 each Topical Daily  . clopidogrel  75 mg Oral Daily  . digoxin  0.125 mg Oral Daily  . enoxaparin (LOVENOX) injection  40 mg Subcutaneous Q24H  . escitalopram  10 mg Oral Daily  . furosemide  40 mg Oral BID  . sodium  chloride flush  10-40 mL Intracatheter Q12H  . sodium chloride flush  3 mL Intravenous Q12H  . sodium chloride flush  3 mL Intravenous Q12H  . spironolactone  12.5 mg Oral Daily    Infusions: . sodium chloride    . sodium chloride    . norepinephrine (LEVOPHED) Adult infusion Stopped (10/11/19 1318)    PRN Medications: sodium chloride, sodium chloride, acetaminophen, ondansetron (ZOFRAN) IV, sodium chloride flush, sodium chloride flush, sodium chloride flush   Assessment/Plan   1. Acute on chronic systolic CHF: 9/21 echo showed no vegetations but EF was down to 20-25% with severely decreased RV systolic function, moderate MR, moderate TR s/p TV repair, bioprosthetic AoV functioning normally (but there is evidence of peri-valvular leakage). Of note, intra-op TEE at time of valve surgery showed normal EF. She did not have a post-op echo until 9/21. Now with biventricular failure. Cause is uncertain, ?viral myocarditis ("cold" in 8/21), ?LV damage from severe aortic insufficiency now more obvious s/p AVR (but would not explain severe RV dysfunction), ?volume load from aortic root => LV fistula. On exam, she is volume overloaded and RHC showed significantly elevated PCWP and RA pressure.  Cardiac index was low at 1.6.  cMRI with no LGE. No evidence of prior MI, infiltrative disease or myocarditis. LVEF 18%. RVEF 21%.  She has diuresed well with IV Lasix. Now on PO diuretics. Euvolemic. Co-ox stable off milrinone at 78%.  - Continue Lasix 40 mg bid  - Off Entresto for now with low BP.  May try low dose losartan qhs.  - Continue spironolactone 12.5 daily.  - Digoxin 0.125 daily, level ok.  - Eventual SGLT2i  2. H/o endocarditis: Culture negative. S/p bioprosthetic aortic valve replacement and TV repair. 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 TEE did not appear to show active vegetation.  - Sent blood cultures, NGTD.  3. Bicuspid aortic valve  disorder: Now s/p bioprosthetic AVR. Thoracic aorta not dilated on 3/21 CT.  4. H/o R CFV DVT: Has IVC filter, think ok to remove at this point.  5. H/o CVA: Had left MCA revascularization by IR, complicated by left ICA dissection and stent placement.  - On ASA 81 + Plavix for stent.  6. Polysubstance abuse: No drugs, ETOH, or smoking since 3/21. 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  Suspect this is a sequelae of prior endocarditis.  No fever, blood cultures so far negative.  TEE showed aortic root to LV peri-valvular leakage with a serpiginous course, suspect no more than moderate in severity.  Does not explain extent of her cardiomyopathy.  Discussed with Dr. Excell Seltzer, will follow for now with serial echoes.    Length of Stay: [address] PA-C  10/12/2019, 7:13 AM  Advanced Heart Failure Team Pager [phone] (M-F;  7a - 4p)  Please contact [facility] for night-coverage after hours (4p -7a ) and weekends on [email or web address]  Patient seen with PA, agree with the above note.   She is doing well today, now off milrinone and NE, co-ox 78%.  CVP 5. SBP 100s today.   General: NAD Neck: No JVD, no thyromegaly or thyroid nodule.  Lungs: Clear to auscultation bilaterally with normal respiratory effort. CV: Nondisplaced PMI.  Heart regular S1/S2, no S3/S4, 2/6 SEM RUSB.  No peripheral edema.   Abdomen: Soft, nontender, no hepatosplenomegaly, no distention.  Skin: Intact without lesions or rashes.  Neurologic: Alert and oriented x 3.  Psych: Normal affect. Extremities: No clubbing or cyanosis.  HEENT: Normal.   Doing well today.  Continue Lasix 40 mg bid, digoxin 0.125, and spironolactone 12.5 mg daily.  BP too low right now for Entresto, will add losartan 12.5 daily.  SGLT2-inhibitor soon.   I will transfer her to step-down, possibly home tomorrow if BP stable.   Marca Ancona 10/12/2019 12:27 PM

## 2019-10-13 LAB — BASIC METABOLIC PANEL
Anion gap: 9 (ref 5–15)
BUN: 33 mg/dL — ABNORMAL HIGH (ref 6–20)
CO2: 25 mmol/L (ref 22–32)
Calcium: 8.9 mg/dL (ref 8.9–10.3)
Chloride: 102 mmol/L (ref 98–111)
Creatinine, Ser: 0.97 mg/dL (ref 0.44–1.00)
GFR calc Af Amer: >60 mL/min (ref >60)
GFR calc non Af Amer: >60 mL/min (ref >60)
Glucose, Bld: 76 mg/dL (ref 70–99)
Potassium: 4.2 mmol/L (ref 3.5–5.1)
Sodium: 136 mmol/L (ref 135–145)

## 2019-10-13 LAB — CULTURE, BLOOD (ROUTINE X 2)
Culture: NO GROWTH
Culture: NO GROWTH
Special Requests: ADEQUATE
Special Requests: ADEQUATE

## 2019-10-13 LAB — CBC
HCT: 43.7 % (ref 36.0–46.0)
Hemoglobin: 13.8 g/dL (ref 12.0–15.0)
MCH: 26 pg (ref 26.0–34.0)
MCHC: 31.6 g/dL (ref 30.0–36.0)
MCV: 82.3 fL (ref 80.0–100.0)
Platelets: 266 10*3/uL (ref 150–400)
RBC: 5.31 MIL/uL — ABNORMAL HIGH (ref 3.87–5.11)
RDW: 15.8 % — ABNORMAL HIGH (ref 11.5–15.5)
WBC: 4.7 10*3/uL (ref 4.0–10.5)
nRBC: 0 % (ref 0.0–0.2)

## 2019-10-13 LAB — COOXEMETRY PANEL
Carboxyhemoglobin: 0.9 % (ref 0.5–1.5)
Methemoglobin: 0.7 % (ref 0.0–1.5)
O2 Saturation: 67.5 %
Total hemoglobin: 14.5 g/dL (ref 12.0–16.0)

## 2019-10-13 MED ORDER — FUROSEMIDE 40 MG PO TABS: 40.0000 mg | ORAL_TABLET | Freq: Every day | ORAL | 11 refills | Status: DC

## 2019-10-13 MED ORDER — LOSARTAN POTASSIUM 25 MG PO TABS: 12.5000 mg | ORAL_TABLET | Freq: Every day | ORAL | 5 refills | Status: DC

## 2019-10-13 MED ORDER — SPIRONOLACTONE 25 MG PO TABS: 12.5000 mg | ORAL_TABLET | Freq: Every day | ORAL | 5 refills | Status: DC

## 2019-10-13 MED ORDER — CLOPIDOGREL BISULFATE 75 MG PO TABS: 75.0000 mg | ORAL_TABLET | Freq: Every day | ORAL | 11 refills | Status: DC

## 2019-10-13 MED ORDER — LOSARTAN POTASSIUM 25 MG PO TABS: 12.5000 mg | ORAL_TABLET | Freq: Every day | ORAL | Status: DC

## 2019-10-13 NOTE — Progress Notes (Addendum)
Patient ID: Lauren Reyes, female   DOB: 1990/09/15, 29 y.o.   MRN: [id number]     Advanced Heart Failure Rounding Note  PCP-Cardiologist: Armanda Magic, MD   Subjective:    Co-ox 68% off milrinone.   BP soft in the 80s systolic but she is asymptomatic. On spiro 12.5 and losartan 12.5 (added yesterday). Also on PO Lasix (PM dose held yesterday for hypotension and low CVP).  CVP remains low today at 4. Wt stable at 178 lb. HR 90s at rest>>low 100s-110s w/ ambulation. On digoxin.   SCr 0.97 but BUN trending up, 14>>23>>33.     cMRI 10/3 IMPRESSION: 1.  Mildly dilated LV with EF 18%, diffuse hypokinesis. 2.  Mildly dilated RV with EF 21%. 3. Bioprosthetic aortic valve with perivalvular leakage noted, better seen on recent TEE. 4.  S/p tricuspid valve repair with mild TR. 5. Nonspecific small area of RV insertion site LGE. No definitive evidence for prior MI, infiltrative disease, or myocarditis.  TEE: EF 25%, moderate RV dysfunction, bioprosthetic aortic valve with mean gradient 15 but peri-valvular leakage from aortic root to LV, serpiginous path.  Suspect moderate degree of regurgitation.    Objective:   Weight Range: 80.8 kg Body mass index is 27.9 kg/m.   Vital Signs:   Temp:  [97.5 F (36.4 C)-98.3 F (36.8 C)] 97.5 F (36.4 C) (10/05 0327) Resp:  [14-33] 15 (10/05 0700) BP: (73-159)/(54-145) 81/61 (10/05 0700) SpO2:  [97 %-100 %] 100 % (10/05 0600) Weight:  [80.5 kg-80.8 kg] 80.8 kg (10/05 0500) Last BM Date: 10/12/19  Weight change: Filed Weights   10/11/19 0600 10/12/19 0758 10/13/19 0500  Weight: 80.9 kg 80.5 kg 80.8 kg    Intake/Output:   Intake/Output Summary (Last 24 hours) at 10/13/2019 0733 Last data filed at 10/13/2019 0600 Gross per 24 hour  Intake 1040 ml  Output 1800 ml  Net -760 ml      Physical Exam    CVP 4 General:  Well appearing young female. No respiratory difficulty HEENT: normal Neck: supple. no JVD. Carotids 2+ bilat;  no bruits. No lymphadenopathy or thyromegaly appreciated. Cor: PMI nondisplaced. Regular rate & rhythm. 2/6 SEM RUSB Lungs: CTAB Abdomen: soft, nontender, nondistended. No hepatosplenomegaly. No bruits or masses. Good bowel sounds. Extremities: no cyanosis, clubbing, rash, edema, warm  Neuro: alert & oriented x 3, cranial nerves grossly intact. moves all 4 extremities w/o difficulty. Affect pleasant.  Telemetry   NSR 90s- sinus tach low 100s (personally reviewed).   Labs    CBC Recent Labs    10/12/19 0618 10/13/19 0457  WBC 4.4 4.7  HGB 14.1 13.8  HCT 43.9 43.7  MCV 81.9 82.3  PLT 259 266   Basic Metabolic Panel Recent Labs    76/19/50 0431 10/11/19 0431 10/12/19 0618 10/13/19 0457  NA 137   < > 135 136  K 3.8   < > 4.1 4.2  CL 101   < > 101 102  CO2 27   < > 26 25  GLUCOSE 107*   < > 87 76  BUN 14   < > 23* 33*  CREATININE 0.85   < > 1.07* 0.97  CALCIUM 8.5*   < > 8.8* 8.9  MG 1.7  --   --   --    < > = values in this interval not displayed.   Liver Function Tests No results for input(s): AST, ALT, ALKPHOS, BILITOT, PROT, ALBUMIN in the last 72 hours. No results for input(s):  LIPASE, AMYLASE in the last 72 hours. Cardiac Enzymes No results for input(s): CKTOTAL, CKMB, CKMBINDEX, TROPONINI in the last 72 hours.  BNP: BNP (last 3 results) Recent Labs    04/15/19 2044 09/30/19 1135 10/08/19 1648  BNP 590.1* 2,906.1* 1,144.6*    ProBNP (last 3 results) No results for input(s): PROBNP in the last 8760 hours.   D-Dimer No results for input(s): DDIMER in the last 72 hours. Hemoglobin A1C No results for input(s): HGBA1C in the last 72 hours. Fasting Lipid Panel No results for input(s): CHOL, HDL, LDLCALC, TRIG, CHOLHDL, LDLDIRECT in the last 72 hours. Thyroid Function Tests No results for input(s): TSH, T4TOTAL, T3FREE, THYROIDAB in the last 72 hours.  Invalid input(s): FREET3  Other results:   Imaging    No results found.   Medications:      Scheduled Medications: . aspirin  81 mg Oral Daily  . Chlorhexidine Gluconate Cloth  6 each Topical Daily  . clopidogrel  75 mg Oral Daily  . digoxin  0.125 mg Oral Daily  . enoxaparin (LOVENOX) injection  40 mg Subcutaneous Q24H  . escitalopram  10 mg Oral Daily  . furosemide  40 mg Oral BID  . losartan  12.5 mg Oral Daily  . sodium chloride flush  10-40 mL Intracatheter Q12H  . sodium chloride flush  3 mL Intravenous Q12H  . sodium chloride flush  3 mL Intravenous Q12H  . spironolactone  12.5 mg Oral Daily    Infusions: . sodium chloride    . sodium chloride    . norepinephrine (LEVOPHED) Adult infusion Stopped (10/11/19 1318)    PRN Medications: sodium chloride, sodium chloride, acetaminophen, ondansetron (ZOFRAN) IV, sodium chloride flush, sodium chloride flush, sodium chloride flush   Assessment/Plan   1. Acute on chronic systolic CHF: 9/21 echo showed no vegetations but EF was down to 20-25% with severely decreased RV systolic function, moderate MR, moderate TR s/p TV repair, bioprosthetic AoV functioning normally (but there is evidence of peri-valvular leakage). Of note, intra-op TEE at time of valve surgery showed normal EF. She did not have a post-op echo until 9/21. Now with biventricular failure. Cause is uncertain, ?viral myocarditis ("cold" in 8/21), ?LV damage from severe aortic insufficiency now more obvious s/p AVR (but would not explain severe RV dysfunction), ?volume load from aortic root => LV fistula. On exam, she is volume overloaded and RHC showed significantly elevated PCWP and RA pressure.  Cardiac index was low at 1.6.  cMRI with no LGE. No evidence of prior MI, infiltrative disease or myocarditis. LVEF 18%. RVEF 21%.  She has diuresed well with IV Lasix. Now on PO diuretics. Euvolemic. Co-ox stable off milrinone at 68%. CVP low at 4. SBPs soft in mid-upper 80s, asymptomatic.  - Hold Lasix today, change to PRN  - BP too soft for Entresto  - Change  Losartan 12.5 to qhs - Continue spironolactone 12.5 daily.  - Digoxin 0.125 daily, level ok.  - no BP room for  blocker - Consider Corlanor if resting HR remains > 70 bpm  - Eventual SGLT2i  2. H/o endocarditis: Culture negative. S/p bioprosthetic aortic valve replacement and TV repair. 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 TEE did not appear to show active vegetation.  - Sent blood cultures, NGTD.  3. Bicuspid aortic valve disorder: Now s/p bioprosthetic AVR. Thoracic aorta not dilated on 3/21 CT.  4. H/o R CFV DVT: Has IVC filter, think ok to  remove at this point.  5. H/o CVA: Had left MCA revascularization by IR, complicated by left ICA dissection and stent placement.  - On ASA 81 + Plavix for stent.  6. Polysubstance abuse: No drugs, ETOH, or smoking since 3/21. 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  Suspect this is a sequelae of prior endocarditis.  No fever, blood cultures so far negative.  TEE showed aortic root to LV peri-valvular leakage with a serpiginous course, suspect no more than moderate in severity.  Does not explain extent of her cardiomyopathy.  Discussed with Dr. Excell Seltzer, will follow for now with serial echoes.   Length of Stay: [address], PA-C  10/13/2019, 7:33 AM  Advanced Heart Failure Team Pager [phone] (M-F; 7a - 4p)  Please contact [facility] for night-coverage after hours (4p -7a ) and weekends on [email or web address]  Patient seen with PA, agree with the above note.    SBP in 90s.  Has been walking to bathroom, denies lightheadedness or dyspnea.  Walked in hall yesterday.  CVP 4 today with co-ox 68%. BUN up today.   General: NAD Neck: No JVD, no thyromegaly or thyroid nodule.  Lungs: Clear to auscultation bilaterally with normal respiratory effort. CV: Nondisplaced PMI.  Heart regular S1/S2, no S3/S4, 2/6 SEM RUSB.  No peripheral edema.   Abdomen: Soft, nontender, no hepatosplenomegaly, no  distention.  Skin: Intact without lesions or rashes.  Neurologic: Alert and oriented x 3.  Psych: Normal affect. Extremities: No clubbing or cyanosis.  HEENT: Normal.   No BP room to titrate meds.  She is now euvolemic.  Not lightheaded.  Hold Lasix today.   I think she can go home today with close followup in CHF clinic next week.  Meds for home: Lasix 40 mg po daily (start tomorrow morning), spironolactone 12.5 daily, losartan 12.5 qhs, digoxin 0.125 daily, ASA 81 daily, Plavix 75 daily.   Marca Ancona 10/13/2019 8:12 AM

## 2019-10-13 NOTE — Discharge Instructions (Signed)
Heart Failure, Diagnosis  Heart failure means that your heart is not able to pump blood in the right way. This makes it hard for your body to work well. Heart failure is usually a long-term (chronic) condition. You must take good care of yourself and follow your treatment plan from your doctor. What are the causes? This condition may be caused by:  High blood pressure.  Build up of cholesterol and fat in the arteries.  Heart attack. This injures the heart muscle.  Heart valves that do not open and close properly.  Damage of the heart muscle. This is also called cardiomyopathy.  Lung disease.  Abnormal heart rhythms. What increases the risk? The risk of heart failure goes up as a person ages. This condition is also more likely to develop in people who:  Are overweight.  Are female.  Smoke or chew tobacco.  Abuse alcohol or illegal drugs.  Have taken medicines that can damage the heart.  Have diabetes.  Have abnormal heart rhythms.  Have thyroid problems.  Have low blood counts (anemia). What are the signs or symptoms? Symptoms of this condition include:  Shortness of breath.  Coughing.  Swelling of the feet, ankles, legs, or belly.  Losing weight for no reason.  Trouble breathing.  Waking from sleep because of the need to sit up and get more air.  Rapid heartbeat.  Being very tired.  Feeling dizzy, or feeling like you may pass out (faint).  Having no desire to eat.  Feeling like you may vomit (nauseous).  Peeing (urinating) more at night.  Feeling confused. How is this treated?     This condition may be treated with:  Medicines. These can be given to treat blood pressure and to make the heart muscles stronger.  Changes in your daily life. These may include eating a healthy diet, staying at a healthy body weight, quitting tobacco and illegal drug use, or doing exercises.  Surgery. Surgery can be done to open blocked valves, or to put devices in  the heart, such as pacemakers.  A donor heart (heart transplant). You will receive a healthy heart from a donor. Follow these instructions at home:  Treat other conditions as told by your doctor. These may include high blood pressure, diabetes, thyroid disease, or abnormal heart rhythms.  Learn as much as you can about heart failure.  Get support as you need it.  Keep all follow-up visits as told by your doctor. This is important. Summary  Heart failure means that your heart is not able to pump blood in the right way.  This condition is caused by high blood pressure, heart attack, or damage of the heart muscle.  Symptoms of this condition include shortness of breath and swelling of the feet, ankles, legs, or belly. You may also feel very tired or feel like you may vomit.  You may be treated with medicines, surgery, or changes in your daily life.  Treat other health conditions as told by your doctor. This information is not intended to replace advice given to you by your health care provider. Make sure you discuss any questions you have with your health care provider. Document Revised: 03/14/2018 Document Reviewed: 03/14/2018 Elsevier Patient Education  [place].   Heart Failure, Self Care Heart failure is a serious condition. This sheet explains things you need to do to take care of yourself at home. To help you stay as healthy as possible, you may be asked to change your diet, take  certain medicines, and make other changes in your life. Your doctor may also give you more specific instructions. If you have problems or questions, call your doctor. What are the risks? Having heart failure makes it more likely for you to have some problems. These problems can get worse if you do not take good care of yourself. Problems may include:  Blood clotting problems. This may cause a stroke.  Damage to the kidneys, liver, or lungs.  Abnormal heart rhythms. Supplies needed:  Scale  for weighing yourself.  Blood pressure monitor.  Notebook.  Medicines. How to care for yourself when you have heart failure Medicines Take over-the-counter and prescription medicines only as told by your doctor. Take your medicines every day.  Do not stop taking your medicine unless your doctor tells you to do so.  Do not skip any medicines.  Get your prescriptions refilled before you run out of medicine. This is important. Eating and drinking   Eat heart-healthy foods. Talk with a diet specialist (dietitian) to create an eating plan.  Choose foods that: ? Have no trans fat. ? Are low in saturated fat and cholesterol.  Choose healthy foods, such as: ? Fresh or frozen fruits and vegetables. ? Fish. ? Low-fat (lean) meats. ? Legumes, such as beans, peas, and lentils. ? Fat-free or low-fat dairy products. ? Whole-grain foods. ? High-fiber foods.  Limit salt (sodium) if told by your doctor. Ask your diet specialist to tell you which seasonings are healthy for your heart.  Cook in healthy ways instead of frying. Healthy ways of cooking include roasting, grilling, broiling, baking, poaching, steaming, and stir-frying.  Limit how much fluid you drink, if told by your doctor. Alcohol use  Do not drink alcohol if: ? Your doctor tells you not to drink. ? Your heart was damaged by alcohol, or you have very bad heart failure. ? You are pregnant, may be pregnant, or are planning to become pregnant.  If you drink alcohol: ? Limit how much you use to:  0-1 drink a day for women.  0-2 drinks a day for men. ? Be aware of how much alcohol is in your drink. In the [place] one drink equals one 12 oz bottle of beer (355 mL), one 5 oz glass of wine (148 mL), or one 1 oz glass of hard liquor (44 mL). Lifestyle   Do not use any products that contain nicotine or tobacco, such as cigarettes, e-cigarettes, and chewing tobacco. If you need help quitting, ask your doctor. ? Do not use  nicotine gum or patches before talking to your doctor.  Do not use illegal drugs.  Lose weight if told by your doctor.  Do physical activity if told by your doctor. Talk to your doctor before you begin an exercise if: ? You are an older adult. ? You have very bad heart failure.  Learn to manage stress. If you need help, ask your doctor.  Get rehab (rehabilitation) to help you stay independent and to help with your quality of life.  Plan time to rest when you get tired. Check weight and blood pressure   Weigh yourself every day. This will help you to know if fluid is building up in your body. ? Weigh yourself every morning after you pee (urinate) and before you eat breakfast. ? Wear the same amount of clothing each time. ? Write down your daily weight. Give your record to your doctor.  Check and write down your blood pressure as told by  your doctor.  Check your pulse as told by your doctor. Dealing with very hot and very cold weather  If it is very hot: ? Avoid activities that take a lot of energy. ? Use air conditioning or fans, or find a cooler place. ? Avoid caffeine and alcohol. ? Wear clothing that is loose-fitting, lightweight, and light-colored.  If it is very cold: ? Avoid activities that take a lot of energy. ? Layer your clothes. ? Wear mittens or gloves, a hat, and a scarf when you go outside. ? Avoid alcohol. Follow these instructions at home:  Stay up to date with shots (vaccines). Get pneumococcal and flu (influenza) shots.  Keep all follow-up visits as told by your doctor. This is important. Contact a doctor if:  You gain weight quickly.  You have increasing shortness of breath.  You cannot do your normal activities.  You get tired easily.  You cough a lot.  You don't feel like eating or feel like you may vomit (nauseous).  You become puffy (swell) in your hands, feet, ankles, or belly (abdomen).  You cannot sleep well because it is hard to  breathe.  You feel like your heart is beating fast (palpitations).  You get dizzy when you stand up. Get help right away if:  You have trouble breathing.  You or someone else notices a change in your behavior, such as having trouble staying awake.  You have chest pain or discomfort.  You pass out (faint). These symptoms may be an emergency. Do not wait to see if the symptoms will go away. Get medical help right away. Call your local emergency services (911 in the [place] Do not drive yourself to the hospital. Summary  Heart failure is a serious condition. To care for yourself, you may have to change your diet, take medicines, and make other lifestyle changes.  Take your medicines every day. Do not stop taking them unless your doctor tells you to do so.  Eat heart-healthy foods, such as fresh or frozen fruits and vegetables, fish, lean meats, legumes, fat-free or low-fat dairy products, and whole-grain or high-fiber foods.  Ask your doctor if you can drink alcohol. You may have to stop alcohol use if you have very bad heart failure.  Contact your doctor if you gain weight quickly or feel that your heart is beating too fast. Get help right away if you pass out, or have chest pain or trouble breathing. This information is not intended to replace advice given to you by your health care provider. Make sure you discuss any questions you have with your health care provider. Document Revised: 04/08/2018 Document Reviewed: 04/09/2018 Elsevier Patient Education  2020 Elsevier Inc.   Heart Failure Action Plan A heart failure action plan helps you understand what to do when you have symptoms of heart failure. Follow the plan that was created by you and your health care provider. Review your plan each time you visit your health care provider. Red zone These signs and symptoms mean you should get medical help right away:  You have trouble breathing when resting.  You have a dry cough that is  getting worse.  You have swelling or pain in your legs or abdomen that is getting worse.  You suddenly gain more than 2-3 lb (0.9-1.4 kg) in a day, or more than 5 lb (2.3 kg) in one week. This amount may be more or less depending on your condition.  You have trouble staying awake or you feel   confused.  You have chest pain.  You do not have an appetite.  You pass out. If you experience any of these symptoms:  Call your local emergency services (911 in the [place] right away or seek help at the emergency department of the nearest hospital. Yellow zone These signs and symptoms mean your condition may be getting worse and you should make some changes:  You have trouble breathing when you are active or you need to sleep with extra pillows.  You have swelling in your legs or abdomen.  You gain 2-3 lb (0.9-1.4 kg) in one day, or 5 lb (2.3 kg) in one week. This amount may be more or less depending on your condition.  You get tired easily.  You have trouble sleeping.  You have a dry cough. If you experience any of these symptoms:  Contact your health care provider within the next day.  Your health care provider may adjust your medicines. Green zone These signs mean you are doing well and can continue what you are doing:  You do not have shortness of breath.  You have very little swelling or no new swelling.  Your weight is stable (no gain or loss).  You have a normal activity level.  You do not have chest pain or any other new symptoms. Follow these instructions at home:  Take over-the-counter and prescription medicines only as told by your health care provider.  Weigh yourself daily. Your target weight is __________ lb (__________ kg). ? Call your health care provider if you gain more than __________ lb (__________ kg) in a day, or more than __________ lb (__________ kg) in one week.  Eat a heart-healthy diet. Work with a diet and nutrition specialist (dietitian) to create  an eating plan that is best for you.  Keep all follow-up visits as told by your health care provider. This is important. Where to find more information  American Heart Association: www.heart.org Summary  Follow the action plan that was created by you and your health care provider.  Get help right away if you have any symptoms in the Red zone. This information is not intended to replace advice given to you by your health care provider. Make sure you discuss any questions you have with your health care provider. Document Revised: 12/07/2016 Document Reviewed: 02/04/2016 Elsevier Patient Education  2020 [employer].

## 2019-10-13 NOTE — TOC Transition Note (Addendum)
Transition of Care [facility]) - CM/SW Discharge Note   Patient Details  Name: Lauren Reyes MRN: [id number] Date of Birth: [birth year]  [facility]) CM/SW Contact:  Epifanio Lesches, RN Phone Number: [phone] 10/13/2019, 11:25 AM   Clinical Narrative:    Patient will DC to: home  Anticipated DC date: 10/13/2019 Family notified: mom Transport by: car  Admitted with CHF.Hx of polysubstance abuse (cocaine, heroin, methamphetamines) and smoking, endocarditis, and prolonged hospitalization 3/21-6/21. From home with mom. PTA independent with ADL's, no dme usage.  Per MD patient ready for DC today. RN, patient, and  patient's mom notified of DC. Pt states understands HF and teaching has been. States has scale @ home and will weigh daily. Post f/u appointment noted on AVS with cardiologist, reinforced per NCM. NCM also shared [facility] with pt , pt states without PCP. Pt interested . NCM arranged appointment with clinic and noted on AVS. Pt appreciative.  Pt without Rx med concerns or affordability. No DME needs.  RNCM will sign off for now as intervention is no longer needed. Please consult [place] again if new needs arise.  Lauren Reyes (Mother)      [phone]       Final next level of care: Home/Self Care Barriers to Discharge: No Barriers Identified   Patient Goals and CMS Choice Patient states their goals for this hospitalization and ongoing recovery are:: to feel better      Discharge Placement                       Discharge Plan and Services                DME Arranged: N/A                    Social Determinants of Health (SDOH) Interventions     Readmission Risk Interventions Readmission Risk Prevention Plan 06/09/2019  Transportation Screening Complete  Medication Review [employer]) Complete  PCP or Specialist appointment within 3-5 days of discharge Complete  HRI or Home Care Consult Complete  SW Recovery Care/Counseling  Consult Complete  Palliative Care Screening Not Applicable  Skilled Nursing Facility Not Applicable

## 2019-10-13 NOTE — Progress Notes (Signed)
Discharge instructions given to patient. Verbalized understanding. Discharged to home with mother.

## 2019-10-13 NOTE — Discharge Summary (Signed)
Advanced Heart Failure Team  Discharge Summary   Patient ID: Lauren Reyes MRN: [id number], DOB/AGE: 1990-09-10 28 y.o. 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 with history of polysubstance abuse (cocaine, heroin, methamphetamines) and smoking, endocarditis, and prolonged hospitalization 3/21-6/21 was referred by Dr. Mayford Knife for evaluation of new onset cardiomyopathy.  Patient was admitted to [facility] in 3/21 with left MCA CVA. She had revascularization of the MCA by IR, procedure complicated by left carotid dissection treated with a stent. TEE showed endocarditis => large tricuspid regurgitation with mild to moderate TR, small MV vegetation with mild MR, and aortic valve vegetation (bicuspid valve) with severe AI.  Blood cultures remained negative, she was treated with empiric abx. She had septic emboli to the brain, kidneys, spleen, and lungs.  On 04/28/19, she had bioprosthetic AVR and debridement + repair of the tricuspid valve. She additionally had a right CFV DVT.  As she was not able to be anticoagulated with septic emboli to the brain as well as the carotid dissection, she had an IVC filter placed.  After a prolonged course, she was discharged in 6/21.  She is now living at home with her mother.  She has not smoked or used drugs since she was admitted in 3/21.    She initially made progress after discharge and started to get stronger.  However, she felt like she suffered a setback in 8/21 after developing a "cold," COVID-19 negative. Since then, she has become steadily more short of breath w/ NYHA Class III symptoms + 20 lb wt gain in < 1 month.   Echo was done in 9/21, this showed no  vegetations but EF was down to 20-25% with severely decreased RV systolic function, moderate MR, moderate TR s/p TV repair, bioprosthetic AoV functioning normally.  Of note, intra-op TEE at time of valve surgery showed normal EF. She did not have a post-op echo until 9/21.   She was set up for RHC on 9/30 which showed significantly elevated PCWP and RA pressure. Cardiac index low at 1.6. She was directedly admitted from the cath lab for IV diuretics and milrinone, 0.25. cMRI was done and showed no LGE. No evidence of prior MI, infiltrative disease or myocarditis. LVEF 18%. RVEF 21%. She diuresed well w/ IV lasix and tolerated milrinone wean. Co-ox remained stable at 68% off milrinone. She was transitioned to GDMT for systolic HF. BP did not tolerate Entresto. She was placed on low dose losartan, spiro, digoxin and daily PO Lasix. Plan Initiation of a SGLT2i at post hospital f/u.   A TEE was also performed this hospitalization, given findings of aortic root to LV fistula at time of RHC. Suspect this is a sequelae of prior endocarditis. TEE showed aortic root to LV peri-valvular leakage with a serpiginous course, suspect no more than moderate in severity.  Does not explain extent of her cardiomyopathy.  Case was siscussed with Dr. Excell Seltzer. Plan is to follow for now with serial echoes. TEE also did not show active vegetation and blood cultures were negative.   On 10/5, she was seen and examined by Dr. Shirlee Latch and felt stable for d/c home. Dyspnea/orthopnea/ PND resolved. SCr and VSS. She  ambulated w/o difficulty. Post hospital f/u has been arranged in the [facility] in 1 week on 10/13.     Discharge Weight Range: 178 lb  Discharge Vitals: Blood pressure 95/70, pulse 95, temperature 97.9 F (36.6 C), temperature source Oral, resp. rate 18, height 5\' 7"  (1.702 m), weight 80.8 kg, SpO2 100 %.  Labs: Lab Results  Component Value Date   WBC 4.7 10/13/2019   HGB 13.8 10/13/2019   HCT 43.7 10/13/2019   MCV 82.3  10/13/2019   PLT 266 10/13/2019    Recent Labs  Lab 10/08/19 1648 10/09/19 0420 10/13/19 0457  NA 138   < > 136  K 3.9   < > 4.2  CL 105   < > 102  CO2 22   < > 25  BUN 12   < > 33*  CREATININE 1.04*   < > 0.97  CALCIUM 9.0   < > 8.9  PROT 7.0  --   --   BILITOT 1.1  --   --   ALKPHOS 87  --   --   ALT 41  --   --   AST 25  --   --   GLUCOSE 118*   < > 76   < > = values in this interval not displayed.   Lab Results  Component Value Date   CHOL 163 04/02/2019   HDL <10 (L) 04/02/2019   LDLCALC NOT CALCULATED 04/02/2019   TRIG 258 (H) 04/02/2019   BNP (last 3 results) Recent Labs    04/15/19 2044 09/30/19 1135 10/08/19 1648  BNP 590.1* 2,906.1* 1,144.6*    ProBNP (last 3 results) No results for input(s): PROBNP in the last 8760 hours.   Diagnostic Studies/Procedures   No results found.  Discharge Medications   Allergies as of 10/13/2019   No Known Allergies     Medication List    STOP taking these medications   Entresto 24-26 MG Generic drug: sacubitril-valsartan   ferrous fumarate-b12-vitamic C-folic acid capsule Commonly known as: TRINSICON / FOLTRIN     TAKE these medications   aspirin 81 MG EC tablet Take 1 tablet (81 mg total) by mouth daily.   clopidogrel 75 MG tablet Commonly known as: PLAVIX Take 1 tablet (75 mg total) by mouth daily.   digoxin 0.125 MG tablet Commonly known as: LANOXIN Take 1 tablet (0.125 mg total) by mouth daily.   escitalopram 10 MG tablet Commonly known as: LEXAPRO Take 1 tablet (10 mg total) by mouth daily.   furosemide 40 MG tablet Commonly known as: Lasix Take 1 tablet (40 mg total) by mouth daily. Start taking on: October 14, 2019 What changed: See the new instructions.   losartan 25 MG tablet Commonly known as: COZAAR Take 0.5 tablets (12.5 mg total) by mouth at bedtime.   potassium chloride SA 20 MEQ tablet Commonly known as: KLOR-CON Take 1 tablet (20 mEq total) by mouth daily.    spironolactone 25 MG tablet Commonly known as: ALDACTONE Take 0.5 tablets (12.5 mg total) by mouth daily.       Disposition   The patient will be discharged in stable condition to home.   Follow-up Information    [facility] Follow up on 10/21/2019.   Specialty: Cardiology Why: 10:30 AM  [facility], Parking Garage Code 3009 Contact information: [address] [phone] [place] [phone]       [facility]. Go on 11/10/2019.   Why: 2:30 pm with Dr. Valarie Merino information: [address] [place] [address] [phone]                Duration of Discharge Encounter: Greater than 35 minutes   Signed, Robbie Lis, PA-C  10/13/2019, 12:44 PM

## 2019-10-21 ENCOUNTER — Telehealth (HOSPITAL_COMMUNITY): Payer: Self-pay | Admitting: Pharmacy Technician

## 2019-10-21 ENCOUNTER — Ambulatory Visit (HOSPITAL_COMMUNITY)
Admit: 2019-10-21 | Discharge: 2019-10-21 | Disposition: A | Discharge status: Home / Self Care | Payer: Medicaid Other | Source: Ambulatory Visit | Attending: Cardiology | Admitting: Cardiology

## 2019-10-21 ENCOUNTER — Other Ambulatory Visit: Payer: Self-pay

## 2019-10-21 ENCOUNTER — Encounter (HOSPITAL_COMMUNITY): Payer: Self-pay

## 2019-10-21 VITALS — BP 100/70 | HR 94 | Wt 192.4 lb

## 2019-10-21 DIAGNOSIS — Z7982 Long term (current) use of aspirin: Secondary | ICD-10-CM | POA: Insufficient documentation

## 2019-10-21 DIAGNOSIS — Z86718 Personal history of other venous thrombosis and embolism: Secondary | ICD-10-CM | POA: Insufficient documentation

## 2019-10-21 DIAGNOSIS — Z79899 Other long term (current) drug therapy: Secondary | ICD-10-CM | POA: Insufficient documentation

## 2019-10-21 DIAGNOSIS — Q231 Congenital insufficiency of aortic valve: Secondary | ICD-10-CM | POA: Diagnosis not present

## 2019-10-21 DIAGNOSIS — I5082 Biventricular heart failure: Secondary | ICD-10-CM | POA: Diagnosis not present

## 2019-10-21 DIAGNOSIS — I429 Cardiomyopathy, unspecified: Secondary | ICD-10-CM | POA: Diagnosis not present

## 2019-10-21 DIAGNOSIS — Z7901 Long term (current) use of anticoagulants: Secondary | ICD-10-CM | POA: Diagnosis not present

## 2019-10-21 DIAGNOSIS — Z8673 Personal history of transient ischemic attack (TIA), and cerebral infarction without residual deficits: Secondary | ICD-10-CM | POA: Insufficient documentation

## 2019-10-21 DIAGNOSIS — I5022 Chronic systolic (congestive) heart failure: Secondary | ICD-10-CM | POA: Diagnosis not present

## 2019-10-21 DIAGNOSIS — Z7902 Long term (current) use of antithrombotics/antiplatelets: Secondary | ICD-10-CM | POA: Diagnosis not present

## 2019-10-21 DIAGNOSIS — Z87891 Personal history of nicotine dependence: Secondary | ICD-10-CM | POA: Insufficient documentation

## 2019-10-21 DIAGNOSIS — Z953 Presence of xenogenic heart valve: Secondary | ICD-10-CM | POA: Insufficient documentation

## 2019-10-21 DIAGNOSIS — F1410 Cocaine abuse, uncomplicated: Secondary | ICD-10-CM | POA: Diagnosis not present

## 2019-10-21 DIAGNOSIS — I7771 Dissection of carotid artery: Secondary | ICD-10-CM | POA: Diagnosis not present

## 2019-10-21 LAB — BASIC METABOLIC PANEL
Anion gap: 10 (ref 5–15)
BUN: 16 mg/dL (ref 6–20)
CO2: 23 mmol/L (ref 22–32)
Calcium: 8.8 mg/dL — ABNORMAL LOW (ref 8.9–10.3)
Chloride: 104 mmol/L (ref 98–111)
Creatinine, Ser: 0.95 mg/dL (ref 0.44–1.00)
GFR, Estimated: >60 mL/min (ref >60)
Glucose, Bld: 122 mg/dL — ABNORMAL HIGH (ref 70–99)
Potassium: 3.5 mmol/L (ref 3.5–5.1)
Sodium: 137 mmol/L (ref 135–145)

## 2019-10-21 MED ORDER — DAPAGLIFLOZIN PROPANEDIOL 10 MG PO TABS: 10.0000 mg | ORAL_TABLET | Freq: Every day | ORAL | 3 refills | Status: DC

## 2019-10-21 NOTE — Progress Notes (Signed)
Advanced Heart Failure Clinic Note   Referring Physician: PCP: Patient, No Pcp Per PCP-Cardiologist: Armanda Magic, MD  AHFC: Dr. Shirlee Latch   HPI:  29 y.o.with history of polysubstance abuse (cocaine, heroin, methamphetamines) and smoking, endocarditis, and prolonged hospitalization 3/21-6/21 was referred by Dr. Mayford Knife for evaluation of new onset cardiomyopathy. Patient was admitted to [facility] in 3/21 with left MCA CVA. She had revascularization of the MCA by IR, procedure complicated by left carotid dissection treated with a stent. TEE showed endocarditis =>large tricuspid regurgitation with mild to moderate TR, small MV vegetation with mild MR, and aortic valve vegetation (bicuspid valve) with severe AI. Blood cultures remained negative, she was treated with empiric abx. She had septic emboli to the brain, kidneys, spleen, and lungs. On 04/28/19, she had bioprosthetic AVR and debridement + repair of the tricuspid valve. She additionally had a right CFV DVT. As she was not able to be anticoagulated with septic emboli to the brain as well as the carotid dissection, she had an IVC filter placed. After a prolonged course, she was discharged in 6/21. She is now living at home with her mother. She has not smoked or used drugs since she was admitted in 3/21.   She initially made progress after discharge and started to get stronger. However, she felt like she suffered a setback in 8/21 after developing a "cold," COVID-19 negative. Since then, she has become steadily more short of breath w/ NYHA Class III symptoms + 20 lb wt gain in <1 month.   Echo was done in 9/21, this showed no vegetations but EF was down to 20-25% with severely decreased RV systolic function, moderate MR, moderate TR s/p TV repair, bioprosthetic AoV functioning normally. Of note, intra-op TEE at time of valve surgery showed normal EF. She did not have a post-op echo until 9/21.   She was set up for RHC on 9/30 which showed  significantly elevated PCWP and RA pressure. Cardiac index low at 1.6. She was directedly admitted from the cath lab for IV diuretics and milrinone, 0.25. cMRI was done and showed no LGE. No evidence of prior MI, infiltrative disease or myocarditis. LVEF 18%. RVEF 21%. She diuresed well w/ IV lasix and tolerated milrinone wean. Co-ox remained stable at 68% off milrinone. She was transitioned to GDMT for systolic HF. BP did not tolerate Entresto or  blocker.  She was placed on low dose losartan, spiro, digoxin and daily PO Lasix w/ intent to start a SGLT2i at post hospital f/u.   A TEE was also performed recent hospitalization, given findings of aortic root to LV fistula at time of RHC. Suspect this is a sequelae of prior endocarditis. TEE showed aortic root to LV peri-valvular leakage with a serpiginous course, suspect no more than moderate in severity. Does not explain extent of her cardiomyopathy. Case was siscussed with Dr. Excell Seltzer. Plan is to follow for now with serial echoes. TEE also did not show active vegetation and blood cultures were negative.   She was discharged home on 10/5. D/c wt was 178 lb. She presents to clinic today for post hospital f/u. Here w/ her mother. Wt today is 192 lb by clinic scale. She has not been checking wt daily at home. Reports she has been eating more since leaving the hospital but tries to refrain from high sodium foods. She denies exertional/ resting dyspnea and no LEE but still sleeping w/ 2 pillows. BP 100/70. No orthostatic symptoms. Reports full med compliance.   Review of systems  complete and found to be negative unless listed in HPI.     Past Medical History:  Diagnosis Date  . Acute ischemic left MCA stroke (HCC) 04/01/2019  . Cerebrovascular accident (CVA) due to embolism of precerebral artery (HCC)   . DVT (deep venous thrombosis) (HCC)   . Encounter for central line placement   . Endocarditis 04/28/2019  . Endotracheal tube present   . Infective  endocarditis   . IV drug user   . IVDU (intravenous drug user) 04/07/2019  . Middle cerebral artery embolism, left 04/01/2019  . Normocytic anemia 04/07/2019  . Polysubstance abuse (HCC)   . Tachypnea   . Tobacco use     Current Outpatient Medications  Medication Sig Dispense Refill  . aspirin EC 81 MG EC tablet Take 1 tablet (81 mg total) by mouth daily. 90 tablet 1  . clopidogrel (PLAVIX) 75 MG tablet Take 1 tablet (75 mg total) by mouth daily. 90 tablet 11  . digoxin (LANOXIN) 0.125 MG tablet Take 1 tablet (0.125 mg total) by mouth daily. 90 tablet 3  . escitalopram (LEXAPRO) 10 MG tablet Take 1 tablet (10 mg total) by mouth daily. 90 tablet 1  . furosemide (LASIX) 40 MG tablet Take 1 tablet (40 mg total) by mouth daily. 30 tablet 11  . losartan (COZAAR) 25 MG tablet Take 0.5 tablets (12.5 mg total) by mouth at bedtime. 30 tablet 5  . potassium chloride SA (KLOR-CON) 20 MEQ tablet Take 1 tablet (20 mEq total) by mouth daily. 30 tablet 1  . spironolactone (ALDACTONE) 25 MG tablet Take 0.5 tablets (12.5 mg total) by mouth daily. 45 tablet 5   No current facility-administered medications for this encounter.    No Known Allergies    Social History   Socioeconomic History  . Marital status: Single    Spouse name: Not on file  . Number of children: Not on file  . Years of education: Not on file  . Highest education level: Not on file  Occupational History  . Not on file  Tobacco Use  . Smoking status: Former Smoker    Quit date: 03/10/2019    Years since quitting: 0.6  . Smokeless tobacco: Never Used  Vaping Use  . Vaping Use: Unknown  Substance and Sexual Activity  . Alcohol use: Not Currently  . Drug use: Not Currently  . Sexual activity: Not on file  Other Topics Concern  . Not on file  Social History Narrative  . Not on file   Social Determinants of Health   Financial Resource Strain:   . Difficulty of Paying Living Expenses: Not on file  Food Insecurity:   .  Worried About Programme researcher, broadcasting/film/videounning Out of Food in the Last Year: Not on file  . Ran Out of Food in the Last Year: Not on file  Transportation Needs:   . Lack of Transportation (Medical): Not on file  . Lack of Transportation (Non-Medical): Not on file  Physical Activity:   . Days of Exercise per Week: Not on file  . Minutes of Exercise per Session: Not on file  Stress:   . Feeling of Stress : Not on file  Social Connections:   . Frequency of Communication with Friends and Family: Not on file  . Frequency of Social Gatherings with Friends and Family: Not on file  . Attends Religious Services: Not on file  . Active Member of Clubs or Organizations: Not on file  . Attends BankerClub or Organization Meetings: Not on file  .  Marital Status: Not on file  Intimate Partner Violence:   . Fear of Current or Ex-Partner: Not on file  . Emotionally Abused: Not on file  . Physically Abused: Not on file  . Sexually Abused: Not on file      Family History  Problem Relation Age of Onset  . Heart murmur Brother   . CAD Neg Hx   . Heart failure Neg Hx     Vitals:   10/21/19 1040  BP: 100/70  Pulse: 94  SpO2: 99%  Weight: 87.3 kg (192 lb 6.4 oz)     PHYSICAL EXAM: General:  Well appearing young female. No respiratory difficulty HEENT: normal Neck: supple. no JVD. Carotids 2+ bilat; no bruits. No lymphadenopathy or thyromegaly appreciated. Cor: PMI nondisplaced. Regular rate & rhythm. No rubs, gallops or murmurs. Lungs: clear Abdomen: soft, nontender, nondistended. No hepatosplenomegaly. No bruits or masses. Good bowel sounds. Extremities: no cyanosis, clubbing, rash, edema Neuro: alert & oriented x 3, cranial nerves grossly intact. moves all 4 extremities w/o difficulty. Affect pleasant.  ECG: NSR 86 bpm    ASSESSMENT & PLAN:  1.Chronic systolic CHF: 9/21 echo showed no vegetations but EF was down to 20-25% with severely decreased RV systolic function, moderate MR, moderate TR s/p TV repair,  bioprosthetic AoV functioning normally(but there is evidence of peri-valvular leakage). Of note, intra-op TEE at time of valve surgery showed normal EF. She did not have a post-op echo until 9/21. Now with biventricular failure. Cause is uncertain, ?viral myocarditis ("cold" in 8/21), ?LV damage from severe aortic insufficiency now more obvious s/p AVR (but would not explain severe RV dysfunction), ?volume load from aortic root => LV fistula. Recent RHC 10/08/19 showed significantly elevated PCWP and RA pressure. Cardiac index was low at 1.6.  cMRI with no LGE. No evidence of prior MI, infiltrative disease or myocarditis. LVEF 18%. RVEF 21%.  She diuresed well with IV Lasix and required short term milrinone (tolerated wean w/ stable co-ox). Placed on GDMT. BP did not tolerate Entersto - NYHA Class II - Wt trending up post d/c, BP remains soft 100/70 but no orthostatic symptoms - Add Farxiga 10 mg daily (check baseline hgb A1c) - Continue lasix 40 mg daily  - Continue spiro 12.5 mg daily  - Continue Losartan 12.5 to qhs. BP too soft for Entresto  - Continue Digoxin 0.125 daily. Check Dig level today  - no BP room for ? blocker - We discussed recommendations for the management of chronic heart failure to control volume/symptoms and reduce risk of acute exacerbation that may necessitate hospitalization.  These measures include continuation of current diuretics with close outpatient monitoring of volume status through daily weights.  Patient advised to check weight daily and to call our office if greater than 3 pound weight gain in 24 hours or greater than 5 pound weight gain in the course of 1 week.  Patient also strongly encouraged to adhere to a low salt diet, reducing intake to less than 2 g daily.  2. H/o endocarditis: Culture negative. 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 TEE did not appear to  show active vegetation. Blood cultures negative.  3. Bicuspid aortic valve disorder: Now s/p bioprosthetic AVR. Thoracic aorta not dilated on 3/21 CT.  4. H/o R CFV DVT: Has IVC filter, think ok to remove at this point.  5. H/o CVA: Had left MCA revascularization by IR, complicated  by left ICA dissection and stent placement.  - On ASA 81 + Plavix for stent.  6. Polysubstance abuse: No drugs, ETOH, or smoking since 3/21. 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 Suspect this is a sequelae of prior endocarditis. No fever, blood cultures so far negative.  TEE showed aortic root to LV peri-valvular leakage with a serpiginous course, suspect no more than moderate in severity.  Does not explain extent of her cardiomyopathy. Discussed with Dr. Excell Seltzer, will follow for now with serial echoes.   Keep f/u in 2 weeks w/ Dr. Lillia Mountain, PA-C 10/21/19

## 2019-10-21 NOTE — Patient Instructions (Signed)
START Farxiga 10mg , 1 tablet Daily  Labs done today, your results will be available in MyChart, we will contact you for abnormal readings.  You have a follow up appointment schedule with Dr. for 10/27  If you have any questions or concerns before your next appointment please send 11/27 a message through [place] or call our office at [phone].    TO LEAVE A MESSAGE FOR THE NURSE SELECT OPTION 2, PLEASE LEAVE A MESSAGE INCLUDING: . YOUR NAME . DATE OF BIRTH . CALL BACK NUMBER . REASON FOR CALL**this is important as we prioritize the call backs  YOU WILL RECEIVE A CALL BACK THE SAME DAY AS LONG AS YOU CALL BEFORE 4:00 PM

## 2019-10-21 NOTE — Telephone Encounter (Signed)
Patient Advocate Encounter   Patient was seen in clinic today and started on a new medicaiton. Medicaid is requiring a prior authorization for Farxiga.   PA submitted on [employer] [id number] W Status is pending   Will continue to follow.

## 2019-10-22 LAB — HEMOGLOBIN A1C
Hgb A1c MFr Bld: 5.8 % — ABNORMAL HIGH (ref 4.8–5.6)
Mean Plasma Glucose: 120 mg/dL

## 2019-10-22 NOTE — Telephone Encounter (Signed)
Advanced Heart Failure Patient Advocate Encounter  Prior Authorization for Marcelline Deist has been approved.    PA# [id number] Effective dates: 10/21/19 through 10/20/20  Archer Asa, CPhT

## 2019-11-04 ENCOUNTER — Ambulatory Visit (HOSPITAL_COMMUNITY)
Admission: RE | Admit: 2019-11-04 | Discharge: 2019-11-04 | Disposition: A | Discharge status: Home / Self Care | Payer: Medicaid Other | Source: Ambulatory Visit | Attending: Cardiology | Admitting: Cardiology

## 2019-11-04 ENCOUNTER — Encounter (HOSPITAL_COMMUNITY): Payer: Self-pay | Admitting: Cardiology

## 2019-11-04 ENCOUNTER — Other Ambulatory Visit: Payer: Self-pay

## 2019-11-04 VITALS — BP 120/78 | HR 88 | Wt 186.4 lb

## 2019-11-04 DIAGNOSIS — F1911 Other psychoactive substance abuse, in remission: Secondary | ICD-10-CM | POA: Insufficient documentation

## 2019-11-04 DIAGNOSIS — Z86718 Personal history of other venous thrombosis and embolism: Secondary | ICD-10-CM | POA: Insufficient documentation

## 2019-11-04 DIAGNOSIS — Z7982 Long term (current) use of aspirin: Secondary | ICD-10-CM | POA: Diagnosis not present

## 2019-11-04 DIAGNOSIS — I5022 Chronic systolic (congestive) heart failure: Secondary | ICD-10-CM

## 2019-11-04 DIAGNOSIS — I770 Arteriovenous fistula, acquired: Secondary | ICD-10-CM | POA: Insufficient documentation

## 2019-11-04 DIAGNOSIS — Z8673 Personal history of transient ischemic attack (TIA), and cerebral infarction without residual deficits: Secondary | ICD-10-CM | POA: Insufficient documentation

## 2019-11-04 DIAGNOSIS — Z953 Presence of xenogenic heart valve: Secondary | ICD-10-CM | POA: Insufficient documentation

## 2019-11-04 DIAGNOSIS — Z7902 Long term (current) use of antithrombotics/antiplatelets: Secondary | ICD-10-CM | POA: Insufficient documentation

## 2019-11-04 DIAGNOSIS — Z79899 Other long term (current) drug therapy: Secondary | ICD-10-CM | POA: Diagnosis not present

## 2019-11-04 DIAGNOSIS — Z8679 Personal history of other diseases of the circulatory system: Secondary | ICD-10-CM | POA: Insufficient documentation

## 2019-11-04 DIAGNOSIS — I5082 Biventricular heart failure: Secondary | ICD-10-CM | POA: Insufficient documentation

## 2019-11-04 DIAGNOSIS — Z87891 Personal history of nicotine dependence: Secondary | ICD-10-CM | POA: Insufficient documentation

## 2019-11-04 LAB — BASIC METABOLIC PANEL
Anion gap: 9 (ref 5–15)
BUN: 23 mg/dL — ABNORMAL HIGH (ref 6–20)
CO2: 26 mmol/L (ref 22–32)
Calcium: 9.1 mg/dL (ref 8.9–10.3)
Chloride: 103 mmol/L (ref 98–111)
Creatinine, Ser: 1.19 mg/dL — ABNORMAL HIGH (ref 0.44–1.00)
GFR, Estimated: >60 mL/min (ref >60)
Glucose, Bld: 72 mg/dL (ref 70–99)
Potassium: 4.2 mmol/L (ref 3.5–5.1)
Sodium: 138 mmol/L (ref 135–145)

## 2019-11-04 LAB — DIGOXIN LEVEL: Digoxin Level: 0.6 ng/mL — ABNORMAL LOW (ref 1.0–2.0)

## 2019-11-04 MED ORDER — ENTRESTO 24-26 MG PO TABS: 1.0000 | ORAL_TABLET | Freq: Two times a day (BID) | ORAL | 3 refills | Status: DC

## 2019-11-04 NOTE — Patient Instructions (Signed)
Stop Losartan  Start Entresto 24/26 mg Twice daily   Labs done today, we will call you for abnormal results  Your physician recommends that you return for lab work in: 1 week  Please follow up with our heart failure pharmacist in 3 weeks  Your physician recommends that you schedule a follow-up appointment in: 6 weeks  If you have any questions or concerns before your next appointment please send [place] a message through [place] or call our office at [phone].    TO LEAVE A MESSAGE FOR THE NURSE SELECT OPTION 2, PLEASE LEAVE A MESSAGE INCLUDING: . YOUR NAME . DATE OF BIRTH . CALL BACK NUMBER . REASON FOR CALL**this is important as we prioritize the call backs  YOU WILL RECEIVE A CALL BACK THE SAME DAY AS LONG AS YOU CALL BEFORE 4:00 PM  At the [facility], you and your health needs are our priority. As part of our continuing mission to provide you with exceptional heart care, we have created designated Provider Care Teams. These Care Teams include your primary Cardiologist (physician) and Advanced Practice Providers (APPs- Physician Assistants and Nurse Practitioners) who all work together to provide you with the care you need, when you need it.   You may see any of the following providers on your designated Care Team at your next follow up: Marland Kitchen Dr Arvilla Meres . Dr Marca Ancona . Tonye Becket, NP . Robbie Lis, PA . [name], PharmD   Please be sure to bring in all your medications bottles to every appointment.

## 2019-11-04 NOTE — Progress Notes (Signed)
PCP: Patient, No Pcp Per Cardiology: Dr. Mayford Knife HF Cardiology: Dr. Shirlee Latch  29 y.o. with history of polysubstance abuse (cocaine, heroin, methamphetamines) and smoking, endocarditis, and prolonged hospitalization 3/21-6/21 was referred by Dr. Mayford Knife for evaluation of new onset cardiomyopathy.  Patient was admitted to [facility] in 3/21 with left MCA CVA. She had revascularization of the MCA by IR, procedure complicated by left carotid dissection treated with a stent. TEE showed endocarditis => large tricuspid regurgitation with mild to moderate TR, small MV vegetation with mild MR, and aortic valve vegetation (bicuspid valve) with severe AI.  Blood cultures remained negative, she was treated with empiric abx. She had septic emboli to the brain, kidneys, spleen, and lungs.  On 04/28/19, she had bioprosthetic AVR and debridement + repair of the tricuspid valve. She additionally had a right CFV DVT.  As she was not able to be anticoagulated with septic emboli to the brain as well as the carotid dissection, she had an IVC filter placed.  After a prolonged course, she was discharged in 6/21.  She is now living at home with her mother.  She has not smoked or used drugs since she was admitted in 3/21.    Echo was done in 9/21, this showed no vegetations but EF was down to 20-25% with severely decreased RV systolic function, moderate MR, moderate TR s/p TV repair, bioprosthetic AoV functioning normally.  Of note, intra-op TEE at time of valve surgery showed normal EF. She did not have a post-op echo until 9/21. She has no history of cardiomyopathy or MI in her family.   In 9/21, she had RHC/LHC showing no CAD, markedly elevated filling pressures and low cardiac output.  There was a fistulous connection from the aortic root to the left ventricle.  She was admitted and started on milrinone and diuresed.  TEE was done, showing EF 25%, moderately decreased RV systolic function, bioprosthetic valve with mean gradient 15 mmHg,  serpiginous perivalvular leakage from aortic root to LV, appeared moderate in severity.  Cardiac MRI showed LV EF 18%, RV EF 21%, nonspecific RV insertion site LGE.   She returns today for followup of CHF.  She has been doing better since hospitalization.  Weight down another 6 lbs. She has been walking for exercise without dyspnea.  She even does some short distance jogging.  No orthopnea/PND.  Mild lightheadedness if she stands too fast.  No orthopnea/PND.    Labs (5/21): creatinine 0.73 Labs (10/21): K 3.5, creatinine 0.95  PMH: 1. Polysubstance abuse: Cocaine, heroin, methamphetamines.  None since 3/21. 2. Prior smoker: Stopped 3/21.  3. CVA: 3/21, septic embolus.  She had MCA revascularization by IR.  - Left ICA dissection as complication of procedure requiring stent placement.  4. Endocarditis: 3/21.  Culture negative.  She had septic emboli to brain, kidney, spleen and lungs.   - TEE (3/21): EF 55-60%, normal RV, small MV vegetation with mild MR, large TV vegetation with mild-moderate TR, bicuspid aortic valve with aortic valve endocarditis and severe AI.  - 04/28/19 she had bioprosthetic AVR and debridement + repair of tricuspid valve.  5. DVT (3/21): IVC filter => not candidate for anticoagulation given septic emboli, dissection of carotid with IR procedure initially.  6. Chronic systolic CHF: Echo in 9/21 showed EF 20-25%, global hypokinesis, severely decreased RV systolic function, moderate MR, moderate TR s/p TV repair, bioprosthetic aortic valve with mean gradient 10 mmHg and no regurgitation; no vegetation noted. Most recent prior echo was the TEE done at  the time of surgery in 4/21, EF was normal then.  - LHC/RHC (9/21): No signifcant CAD; mean RA 14, PA 56/26, mean PCWP 28, CI 1.62, PVR 3.7.  Fistulous connection aortic root to LV.  - TEE (9/21): EF 25%, moderately decreased RV systolic function, bioprosthetic aortic valve with mean gradient 15 mmHg, serpiginous perivalvular leakage,  moderate in severity.  - Cardiac MRI: Nonspecific RV insertion site LGE. LV EF 18%, RV EF 21%.  7. 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   Social History   Socioeconomic History  . Marital status: Single    Spouse name: Not on file  . Number of children: Not on file  . Years of education: Not on file  . Highest education level: Not on file  Occupational History  . Not on file  Tobacco Use  . Smoking status: Former Smoker    Quit date: 03/10/2019    Years since quitting: 0.6  . Smokeless tobacco: Never Used  Vaping Use  . Vaping Use: Unknown  Substance and Sexual Activity  . Alcohol use: Not Currently  . Drug use: Not Currently  . Sexual activity: Not on file  Other Topics Concern  . Not on file  Social History Narrative  . Not on file   Social Determinants of Health   Financial Resource Strain:   . Difficulty of Paying Living Expenses: Not on file  Food Insecurity:   . Worried About Programme researcher, broadcasting/film/video in the Last Year: Not on file  . Ran Out of Food in the Last Year: Not on file  Transportation Needs:   . Lack of Transportation (Medical): Not on file  . Lack of Transportation (Non-Medical): Not on file  Physical Activity:   . Days of Exercise per Week: Not on file  . Minutes of Exercise per Session: Not on file  Stress:   . Feeling of Stress : Not on file  Social Connections:   . Frequency of Communication with Friends and Family: Not on file  . Frequency of Social Gatherings with Friends and Family: Not on file  . Attends Religious Services: Not on file  . Active Member of Clubs or Organizations: Not on file  . Attends Banker Meetings: Not on file  . Marital Status: Not on file  Intimate Partner Violence:   . Fear of Current or Ex-Partner: Not on file  . Emotionally Abused: Not on file  . Physically Abused: Not on file  . Sexually Abused: Not on file   Family History  Problem Relation Age of Onset  . Heart  murmur Brother   . CAD Neg Hx   . Heart failure Neg Hx    ROS: All systems reviewed and negative except as per HPI.   Current Outpatient Medications  Medication Sig Dispense Refill  . aspirin EC 81 MG EC tablet Take 1 tablet (81 mg total) by mouth daily. 90 tablet 1  . clopidogrel (PLAVIX) 75 MG tablet Take 1 tablet (75 mg total) by mouth daily. 90 tablet 11  . dapagliflozin propanediol (FARXIGA) 10 MG TABS tablet Take 1 tablet (10 mg total) by mouth daily before breakfast. 30 tablet 3  . digoxin (LANOXIN) 0.125 MG tablet Take 1 tablet (0.125 mg total) by mouth daily. 90 tablet 3  . escitalopram (LEXAPRO) 10 MG tablet Take 1 tablet (10 mg total) by mouth daily. 90 tablet 1  . furosemide (LASIX) 40 MG tablet Take 1 tablet (40 mg total) by  mouth daily. 30 tablet 11  . potassium chloride SA (KLOR-CON) 20 MEQ tablet Take 1 tablet (20 mEq total) by mouth daily. 30 tablet 1  . spironolactone (ALDACTONE) 25 MG tablet Take 0.5 tablets (12.5 mg total) by mouth daily. 45 tablet 5  . sacubitril-valsartan (ENTRESTO) 24-26 MG Take 1 tablet by mouth 2 (two) times daily. 60 tablet 3   No current facility-administered medications for this encounter.   BP 120/78   Pulse 88   Wt 84.6 kg (186 lb 6.4 oz)   SpO2 99%   BMI 29.19 kg/m  General: NAD Neck: JVP 8 cm with HJR, no thyromegaly or thyroid nodule.  Lungs: Clear to auscultation bilaterally with normal respiratory effort. CV: Nondisplaced PMI.  Heart regular S1/S2, no S3/S4, 2/6 SEM RUSB (no diastolic murmur).  No peripheral edema.  No carotid bruit.  Normal pedal pulses.  Abdomen: Soft, nontender, no hepatosplenomegaly, no distention.  Skin: Intact without lesions or rashes.  Neurologic: Alert and oriented x 3.  Psych: Normal affect. Extremities: No clubbing or cyanosis.  HEENT: Normal.   Assessment/Plan: 1.Chronic systolic CHF: 9/21 echo showed no vegetations but EF was down to 20-25% with severely decreased RV systolic function, moderate  MR, moderate TR s/p TV repair, bioprosthetic AoV functioning normally(but there is evidence of peri-valvular leakage). Of note, intra-op TEE at time of valve surgery showed normal EF. She did not have a post-op echo until 9/21. Now with biventricular failure. Cause is uncertain, ?viral myocarditis ("cold" in 8/21), ?LV damage from severe aortic insufficiency now more obvious s/p AVR (but would not explain severe RV dysfunction), ?volume load from aortic root => LV fistula. RHC in 9/21 showed significantly elevated PCWP and RA pressure, cardiac index was low at 1.6. Cardiac MRI showed nonspecific RV insertion site LGE.  She is doing better now, NYHA class II, mild volume overload on exam today.  Weight has trended down.  - Continue Lasix 40 mg daily, BMET today.    - D/c losartan, start Entresto 24/26 bid.  BMET 10 days. This should allow some extra diuresis.    -Continuespironolactone 12.5 daily.  - Digoxin 0.125 daily, check level today.  - Repeat echo after medication titration, ICD if EF remains low.  2. H/o endocarditis: Culture negative. 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 TEE did not appear to show active vegetation.  3. Bicuspid aortic valve disorder: Now s/p bioprosthetic AVR. Thoracic aorta not dilated on 3/21 CT.  4. H/o R CFV DVT: Has IVC filter, think ok to remove at this point.  5. H/o CVA: Had left MCA revascularization by IR, complicated by left ICA dissection and stent placement.  - On ASA 81 + Plavix for stent.  6. Polysubstance abuse: No drugs, ETOH, or smoking since 3/21. 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 Suspect this is a sequelae of prior endocarditis. No fever, blood cultures so far negative.  TEE showed aortic root to LV peri-valvular leakage with a serpiginous course, suspect no more than moderate in severity.  Does not explain extent of  her cardiomyopathy.  Discussed with Dr. Excell Seltzer, will follow for now with serial echoes.   Followup in 3 wks with HF pharmacist, see me in 6 wks.   Marca Ancona 11/04/2019

## 2019-11-04 NOTE — Addendum Note (Signed)
Encounter addended by: Laurey Morale, MD on: 11/04/2019 11:17 PM  Actions taken: Clinical Note Signed, Level of Service modified

## 2019-11-10 ENCOUNTER — Ambulatory Visit: Payer: Medicaid Other | Admitting: Cardiothoracic Surgery

## 2019-11-10 ENCOUNTER — Other Ambulatory Visit: Payer: Self-pay

## 2019-11-10 ENCOUNTER — Inpatient Hospital Stay: Payer: Medicaid Other | Admitting: Family Medicine

## 2019-11-10 VITALS — BP 89/66 | HR 90 | Temp 97.9°F | Resp 20 | Ht 67.0 in | Wt 183.0 lb

## 2019-11-10 DIAGNOSIS — Z952 Presence of prosthetic heart valve: Secondary | ICD-10-CM

## 2019-11-10 DIAGNOSIS — I330 Acute and subacute infective endocarditis: Secondary | ICD-10-CM

## 2019-11-10 DIAGNOSIS — Z9889 Other specified postprocedural states: Secondary | ICD-10-CM | POA: Diagnosis not present

## 2019-11-10 NOTE — Progress Notes (Signed)
[address]       [place] [address]             [phone]      Lauren Reyes Medical Record #[id number] Date of Birth: 07-Nov-1990  Referring: Almon Hercules, MD Primary Care: Patient, No Pcp Per Primary Cardiologist: Armanda Magic, MD   Chief Complaint:   POST OP FOLLOW UP DATE OF PROCEDURE:  04/28/2019 PREOPERATIVE DIAGNOSIS:  Aortic and tricuspid endocarditis with acute stroke. POSTOPERATIVE DIAGNOSIS:  Aortic and tricuspid endocarditis with acute stroke. SURGICAL PROCEDURE:  Aortic valve replacement with pericardial tissue valve, model 3300 TFX 19 mm [employer] serial #[id number], and debridement and repair of tricuspid regurgitation  History of Present Illness:      Patient returns to the office today for follow-up visit after  a long and complicated hospitalization.  Which include presentation with acute stroke, placement of carotid stent, placement of caval filter for DVT inability to anticoagulate, ultimately TEE was done showing severe aortic insufficiency and endocarditis.  On [name] she underwent aortic valve replacement and tricuspid valve repair for endocarditis.  She remained in the hospital until June 1 to complete 6 weeks of postop antibiotics.    Initially she made good progress, avoiding any recurrent IV drug use.  In late September she was noted to have significant decreased LV function with heart failure symptoms.  She was evaluated by Dr. Shirlee Latch including cardiac catheterization and TEE, ejection fraction had dropped to the 20% range, she had a small fistulous track aortic to left atrium.  Aggressive medical treatment including diuretics and Entresto she has significantly improved without fluid retention or significant shortness of breath with exertion.  This was after several days of hospital treatment before discharge.  She has had no recurrent fever chills night sweats or other indication of infection.  Past  Medical History:  Diagnosis Date  . Acute ischemic left MCA stroke (HCC) 04/01/2019  . Cerebrovascular accident (CVA) due to embolism of precerebral artery (HCC)   . DVT (deep venous thrombosis) (HCC)   . Encounter for central line placement   . Endocarditis 04/28/2019  . Endotracheal tube present   . Infective endocarditis   . IV drug user   . IVDU (intravenous drug user) 04/07/2019  . Middle cerebral artery embolism, left 04/01/2019  . Normocytic anemia 04/07/2019  . Polysubstance abuse (HCC)   . Tachypnea   . Tobacco use      Social History   Tobacco Use  Smoking Status Former Smoker  . Quit date: 03/10/2019  . Years since quitting: 0.6  Smokeless Tobacco Never Used    Social History   Substance and Sexual Activity  Alcohol Use Not Currently     No Known Allergies  Current Outpatient Medications  Medication Sig Dispense Refill  . aspirin EC 81 MG EC tablet Take 1 tablet (81 mg total) by mouth daily. 90 tablet 1  . clopidogrel (PLAVIX) 75 MG tablet Take 1 tablet (75 mg total) by mouth daily. 90 tablet 11  . dapagliflozin propanediol (FARXIGA) 10 MG TABS tablet Take 1 tablet (10 mg total) by mouth daily before breakfast. 30 tablet 3  . digoxin (LANOXIN) 0.125 MG tablet Take 1 tablet (0.125 mg total) by mouth daily. 90 tablet 3  . escitalopram (LEXAPRO) 10 MG tablet Take 1 tablet (10 mg total) by mouth daily. 90 tablet 1  . furosemide (LASIX) 40 MG tablet Take 1 tablet (40 mg total) by  mouth daily. 30 tablet 11  . potassium chloride SA (KLOR-CON) 20 MEQ tablet Take 1 tablet (20 mEq total) by mouth daily. 30 tablet 1  . sacubitril-valsartan (ENTRESTO) 24-26 MG Take 1 tablet by mouth 2 (two) times daily. 60 tablet 3  . spironolactone (ALDACTONE) 25 MG tablet Take 0.5 tablets (12.5 mg total) by mouth daily. 45 tablet 5   No current facility-administered medications for this visit.       Physical Exam: BP (!) 89/66 (BP Location: Right Arm, Cuff Size: Normal)   Pulse 90    Temp 97.9 F (36.6 C) (Skin)   Resp 20   Ht 5\' 7"  (1.702 m)   Wt 183 lb (83 kg)   SpO2 99% Comment: RA  BMI 28.66 kg/m  General appearance: alert, cooperative and no distress Neck: no adenopathy, no carotid bruit, no JVD, supple, symmetrical, trachea midline and thyroid not enlarged, symmetric, no tenderness/mass/nodules Resp: clear to auscultation bilaterally Cardio: 2/6 early systolic ejection murmur no diastolic murmur appreciated GI: soft, non-tender; bowel sounds normal; no masses,  no organomegaly Extremities: extremities normal, atraumatic, no cyanosis or edema and Homans sign is negative, no sign of DVT Neurologic: Grossly normal Sternum stable and well-healed   Diagnostic Studies & Laboratory data:     Recent Radiology Findings:  No results found.   Recent Lab Findings: Lab Results  Component Value Date   WBC 4.7 10/13/2019   HGB 13.8 10/13/2019   HCT 43.7 10/13/2019   PLT 266 10/13/2019   GLUCOSE 72 11/04/2019   CHOL 163 04/02/2019   TRIG 258 (H) 04/02/2019   HDL <10 (L) 04/02/2019   LDLDIRECT 42.9 04/03/2019   LDLCALC NOT CALCULATED 04/02/2019   ALT 41 10/08/2019   AST 25 10/08/2019   NA 138 11/04/2019   K 4.2 11/04/2019   CL 103 11/04/2019   CREATININE 1.19 (H) 11/04/2019   BUN 23 (H) 11/04/2019   CO2 26 11/04/2019   TSH 6.541 (H) 10/08/2019   INR 1.5 (H) 09/30/2019   HGBA1C 5.8 (H) 10/21/2019      Assessment / Plan:   #1 postop after replacement of aortic valve with a tissue valve and repair of tricuspid valve for endocarditis-completed 6 weeks of postsurgical IV antibiotics-no evidence of recurrent infection Now 7 months postop.  With evidence of small fistulous tract from the aorta to the left atrium-overall symptomatically with current treatment the patient is much improved from a heart failure standpoint.  She continues follow-up on a regular basis in the heart failure clinic  Plan to see neurosurgery office as needed  Medication Changes:  No orders of the defined types were placed in this encounter.     10/23/2019 MD      [address] [place] [place] Office [phone]     11/10/2019 3:39 PM

## 2019-11-11 NOTE — Progress Notes (Signed)
PCP: Patient, No Pcp Per Cardiology: Dr. Mayford Knife HF Cardiology: Dr. Shirlee Latch  HPI:  29 y.o. with history of polysubstance abuse (cocaine, heroin, methamphetamines) and smoking, endocarditis, and prolonged hospitalization 3/21-6/21 was referred by Dr. Mayford Knife for evaluation of new onset cardiomyopathy.  Patient was admitted to [facility] in 3/21 with left MCA CVA. She had revascularization of the MCA by IR, procedure complicated by left carotid dissection treated with a stent. TEE showed endocarditis => large tricuspid regurgitation with mild to moderate TR, small MV vegetation with mild MR, and aortic valve vegetation (bicuspid valve) with severe AI.  Blood cultures remained negative, she was treated with empiric abx. She had septic emboli to the brain, kidneys, spleen, and lungs.  On 04/28/19, she had bioprosthetic AVR and debridement + repair of the tricuspid valve. She additionally had a right CFV DVT.  As she was not able to be anticoagulated with septic emboli to the brain as well as the carotid dissection, she had an IVC filter placed.  After a prolonged course, she was discharged in 6/21.  She is now living at home with her mother.  She has not smoked or used drugs since she was admitted in 3/21.    Echo was done in 9/21, this showed no vegetations but EF was down to 20-25% with severely decreased RV systolic function, moderate MR, moderate TR s/p TV repair, bioprosthetic AoV functioning normally.  Of note, intra-op TEE at time of valve surgery showed normal EF. She did not have a post-op echo until 9/21. She has no history of cardiomyopathy or MI in her family.   In 9/21, she had RHC/LHC showing no CAD, markedly elevated filling pressures and low cardiac output.  There was a fistulous connection from the aortic root to the left ventricle.  She was admitted and started on milrinone and diuresed.  TEE was done, showing EF 25%, moderately decreased RV systolic function, bioprosthetic valve with mean gradient 15  mmHg, serpiginous perivalvular leakage from aortic root to LV, appeared moderate in severity.  Cardiac MRI showed LV EF 18%, RV EF 21%, nonspecific RV insertion site LGE.   She recently returned to HF Clinic on 11/04/19 with Dr. Shirlee Latch.  She had been doing better since hospitalization.  Weight was down another 6 lbs. She had been walking for exercise without dyspnea.  She reported some short distance jogging.  No orthopnea/PND.  Reported mild lightheadedness if she stands too fast.  No orthopnea/PND.    Today she returns to HF clinic for pharmacist medication titration. At last visit with MD,  Losartan was discontinued and Entresto 24/26 mg BID was initiated. Overall she is feeling well today. No dizziness, lightheadedness, chest pain or palpitations. No fatigue. No SOB/DOE. Weight has been stable at home. Notably, her furosemide prescription says to take 40 mg daily, but she has been taking furosemide 40 mg BID since she was discharged from the hospital as she did not realize it had been changed. No LEE, PND or orthopnea.    HF Medications: Entresto 24/26 mg BID Spironolactone 12.5 mg daily Dapagliflozin 10 mg daily Digoxin 0.125 mg daily Furosemide 40 mg daily - taking 40 mg BID Potassium chloride 20 mEq daily   Has the patient been experiencing any side effects to the medications prescribed?  no  Does the patient have any problems obtaining medications due to transportation or finances?   No - has traditional [employer]  Understanding of regimen: good Understanding of indications: good Potential of compliance: good Patient understands  to avoid NSAIDs. Patient understands to avoid decongestants.    Pertinent Lab Values (11/20/19):  Serum creatinine 0.95, BUN 24, Potassium 4.5, Sodium 137  Vital Signs:  Weight: 188 lbs (last clinic weight: 186.4 lbs)  Blood pressure: 110/78   Heart rate: 91   Assessment: 1.Chronic systolic CHF: 9/21 echo showed no vegetations but EF was  down to 20-25% with severely decreased RV systolic function, moderate MR, moderate TR s/p TV repair, bioprosthetic AoV functioning normally(but there is evidence of peri-valvular leakage). Of note, intra-op TEE at time of valve surgery showed normal EF. She did not have a post-op echo until 9/21. Now with biventricular failure. Cause is uncertain, ?viral myocarditis ("cold" in 8/21), ?LV damage from severe aortic insufficiency now more obvious s/p AVR (but would not explain severe RV dysfunction), ?volume load from aortic root => LV fistula. RHC in 9/21 showed significantly elevated PCWP and RA pressure, cardiac index was low at 1.6. Cardiac MRI showed nonspecific RV insertion site LGE.   - NYHA class II, euvolemic on exam - Continue furosemide 40 mg BID. Of note, prescription stated she was on furosemide 40 mg daily, but she has been taking 40 mg BID since discharge as she didn't realize her prescription had ever changed. I updated her medication list to reflect her current dose of 40 mg BID.  - Continue Entresto 24/26 mg BID.   - Increasespironolactone to 25 daily. Repeat BMET in 10 days.  - Continue dapagliflozin 10 mg daily  - Continue Digoxin 0.125 daily. Last digoxin level 0.6 ng/mL on 11/04/2019. - Repeat echo after medication titration, ICD if EF remains low.  2. H/o endocarditis: Culture negative. 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 TEE did not appear to show active vegetation.  3. Bicuspid aortic valve disorder: Now s/p bioprosthetic AVR. Thoracic aorta not dilated on 3/21 CT.  4. H/o R CFV DVT: Has IVC filter, think ok to remove at this point.  5. H/o CVA: Had left MCA revascularization by IR, complicated by left ICA dissection and stent placement.  - On ASA 81 + Plavix for stent.  6. Polysubstance abuse: No drugs, ETOH, or smoking since 3/21. 7. Aortic root to  LV fistula: Noted on cath. Suspect this is a sequelae of prior endocarditis. No fever, blood cultures so far negative. TEE showed aortic root to LV peri-valvular leakage with a serpiginous course, suspect no more thanmoderate in severity.Does not explain extent of her cardiomyopathy. Discussed with Dr. Excell Seltzer, will follow for now with serial echoes.    Plan: 1) Medication changes: Based on clinical presentation, vital signs and recent labs will increase spironolactone to 25 mg daily.  2) Labs: repeat BMET in 10 days 3) Follow-up: 1 month with Dr. Jonn Shingles, PharmD, BCPS, [facility], CPP Heart Failure Clinic Pharmacist [phone]

## 2019-11-12 ENCOUNTER — Ambulatory Visit: Payer: Medicaid Other | Admitting: Cardiothoracic Surgery

## 2019-11-18 ENCOUNTER — Other Ambulatory Visit (HOSPITAL_COMMUNITY): Payer: Medicaid Other

## 2019-11-20 ENCOUNTER — Other Ambulatory Visit: Payer: Self-pay

## 2019-11-20 ENCOUNTER — Ambulatory Visit (HOSPITAL_COMMUNITY)
Admission: RE | Admit: 2019-11-20 | Discharge: 2019-11-20 | Disposition: A | Discharge status: Home / Self Care | Payer: Medicaid Other | Source: Ambulatory Visit | Attending: Cardiology | Admitting: Cardiology

## 2019-11-20 DIAGNOSIS — I5022 Chronic systolic (congestive) heart failure: Secondary | ICD-10-CM | POA: Diagnosis not present

## 2019-11-20 LAB — BASIC METABOLIC PANEL
Anion gap: 6 (ref 5–15)
BUN: 24 mg/dL — ABNORMAL HIGH (ref 6–20)
CO2: 29 mmol/L (ref 22–32)
Calcium: 9.6 mg/dL (ref 8.9–10.3)
Chloride: 102 mmol/L (ref 98–111)
Creatinine, Ser: 0.95 mg/dL (ref 0.44–1.00)
GFR, Estimated: >60 mL/min (ref >60)
Glucose, Bld: 80 mg/dL (ref 70–99)
Potassium: 4.5 mmol/L (ref 3.5–5.1)
Sodium: 137 mmol/L (ref 135–145)

## 2019-11-23 ENCOUNTER — Other Ambulatory Visit: Payer: Self-pay

## 2019-11-23 ENCOUNTER — Ambulatory Visit (HOSPITAL_COMMUNITY)
Admission: RE | Admit: 2019-11-23 | Discharge: 2019-11-23 | Disposition: A | Discharge status: Home / Self Care | Payer: Medicaid Other | Source: Ambulatory Visit | Attending: Cardiology | Admitting: Cardiology

## 2019-11-23 VITALS — BP 110/78 | HR 91 | Wt 188.0 lb

## 2019-11-23 DIAGNOSIS — Z952 Presence of prosthetic heart valve: Secondary | ICD-10-CM | POA: Diagnosis not present

## 2019-11-23 DIAGNOSIS — Z7982 Long term (current) use of aspirin: Secondary | ICD-10-CM | POA: Insufficient documentation

## 2019-11-23 DIAGNOSIS — Z8673 Personal history of transient ischemic attack (TIA), and cerebral infarction without residual deficits: Secondary | ICD-10-CM | POA: Diagnosis not present

## 2019-11-23 DIAGNOSIS — F1910 Other psychoactive substance abuse, uncomplicated: Secondary | ICD-10-CM | POA: Insufficient documentation

## 2019-11-23 DIAGNOSIS — I5022 Chronic systolic (congestive) heart failure: Secondary | ICD-10-CM | POA: Insufficient documentation

## 2019-11-23 DIAGNOSIS — Z7902 Long term (current) use of antithrombotics/antiplatelets: Secondary | ICD-10-CM | POA: Diagnosis not present

## 2019-11-23 MED ORDER — SPIRONOLACTONE 25 MG PO TABS
25.0000 mg | ORAL_TABLET | Freq: Every day | ORAL | 3 refills | Status: DC
Start: 2019-11-23 — End: 2020-10-27

## 2019-11-23 MED ORDER — FUROSEMIDE 40 MG PO TABS: 40.0000 mg | ORAL_TABLET | Freq: Two times a day (BID) | ORAL | 3 refills | Status: DC

## 2019-11-23 NOTE — Patient Instructions (Signed)
It was a pleasure seeing you today! ° °MEDICATIONS: °-We are changing your medications today °-Increase spironolactone to 25 mg (1 tablet) daily. °-Call if you have questions about your medications. ° ° °NEXT APPOINTMENT: °Return to clinic in 1 month with Dr. McLean. ° °In general, to take care of your heart failure: °-Limit your fluid intake to 2 Liters (half-gallon) per day.   °-Limit your salt intake to ideally 2-3 grams (2000-3000 mg) per day. °-Weigh yourself daily and record, and bring that "weight diary" to your next appointment.  (Weight gain of 2-3 pounds in 1 day typically means fluid weight.) °-The medications for your heart are to help your heart and help you live longer.   °-Please contact us before stopping any of your heart medications. ° °Call the clinic at [phone] with questions or to reschedule future appointments.  °

## 2019-11-24 LAB — ECHO TEE
AV Mean grad: 15 mmHg
AV Peak grad: 27.7 mmHg
Ao pk vel: 2.63 m/s

## 2019-12-01 DIAGNOSIS — Z736 Limitation of activities due to disability: Secondary | ICD-10-CM

## 2019-12-02 ENCOUNTER — Other Ambulatory Visit (HOSPITAL_COMMUNITY): Payer: Self-pay | Admitting: *Deleted

## 2019-12-02 DIAGNOSIS — I5022 Chronic systolic (congestive) heart failure: Secondary | ICD-10-CM

## 2019-12-07 ENCOUNTER — Ambulatory Visit (HOSPITAL_COMMUNITY)
Admission: RE | Admit: 2019-12-07 | Discharge: 2019-12-07 | Disposition: A | Discharge status: Home / Self Care | Payer: Medicaid Other | Source: Ambulatory Visit | Attending: Cardiology | Admitting: Cardiology

## 2019-12-07 ENCOUNTER — Other Ambulatory Visit: Payer: Self-pay

## 2019-12-07 DIAGNOSIS — I5022 Chronic systolic (congestive) heart failure: Secondary | ICD-10-CM | POA: Diagnosis present

## 2019-12-07 LAB — BASIC METABOLIC PANEL
Anion gap: 8 (ref 5–15)
BUN: 19 mg/dL (ref 6–20)
CO2: 26 mmol/L (ref 22–32)
Calcium: 9.5 mg/dL (ref 8.9–10.3)
Chloride: 103 mmol/L (ref 98–111)
Creatinine, Ser: 0.9 mg/dL (ref 0.44–1.00)
GFR, Estimated: >60 mL/min (ref >60)
Glucose, Bld: 79 mg/dL (ref 70–99)
Potassium: 4.2 mmol/L (ref 3.5–5.1)
Sodium: 137 mmol/L (ref 135–145)

## 2020-01-04 ENCOUNTER — Other Ambulatory Visit: Payer: Self-pay

## 2020-01-04 ENCOUNTER — Encounter (HOSPITAL_COMMUNITY): Payer: Self-pay | Admitting: Cardiology

## 2020-01-04 ENCOUNTER — Ambulatory Visit (HOSPITAL_COMMUNITY)
Admission: RE | Admit: 2020-01-04 | Discharge: 2020-01-04 | Disposition: A | Discharge status: Home / Self Care | Payer: Medicaid Other | Source: Ambulatory Visit | Attending: Cardiology | Admitting: Cardiology

## 2020-01-04 VITALS — BP 100/78 | HR 70 | Wt 194.0 lb

## 2020-01-04 DIAGNOSIS — Z953 Presence of xenogenic heart valve: Secondary | ICD-10-CM | POA: Insufficient documentation

## 2020-01-04 DIAGNOSIS — I5022 Chronic systolic (congestive) heart failure: Secondary | ICD-10-CM

## 2020-01-04 DIAGNOSIS — Z7982 Long term (current) use of aspirin: Secondary | ICD-10-CM | POA: Diagnosis not present

## 2020-01-04 DIAGNOSIS — Z87891 Personal history of nicotine dependence: Secondary | ICD-10-CM | POA: Insufficient documentation

## 2020-01-04 DIAGNOSIS — Z8673 Personal history of transient ischemic attack (TIA), and cerebral infarction without residual deficits: Secondary | ICD-10-CM | POA: Diagnosis not present

## 2020-01-04 DIAGNOSIS — Z86718 Personal history of other venous thrombosis and embolism: Secondary | ICD-10-CM | POA: Diagnosis not present

## 2020-01-04 DIAGNOSIS — Z79899 Other long term (current) drug therapy: Secondary | ICD-10-CM | POA: Diagnosis not present

## 2020-01-04 DIAGNOSIS — Q231 Congenital insufficiency of aortic valve: Secondary | ICD-10-CM | POA: Diagnosis not present

## 2020-01-04 DIAGNOSIS — Z7902 Long term (current) use of antithrombotics/antiplatelets: Secondary | ICD-10-CM | POA: Insufficient documentation

## 2020-01-04 DIAGNOSIS — Z8249 Family history of ischemic heart disease and other diseases of the circulatory system: Secondary | ICD-10-CM | POA: Insufficient documentation

## 2020-01-04 DIAGNOSIS — I5082 Biventricular heart failure: Secondary | ICD-10-CM | POA: Insufficient documentation

## 2020-01-04 HISTORY — DX: Heart failure, unspecified: I50.9

## 2020-01-04 LAB — BASIC METABOLIC PANEL
Anion gap: 11 (ref 5–15)
BUN: 21 mg/dL — ABNORMAL HIGH (ref 6–20)
CO2: 24 mmol/L (ref 22–32)
Calcium: 9.2 mg/dL (ref 8.9–10.3)
Chloride: 103 mmol/L (ref 98–111)
Creatinine, Ser: 0.99 mg/dL (ref 0.44–1.00)
GFR, Estimated: >60 mL/min (ref >60)
Glucose, Bld: 86 mg/dL (ref 70–99)
Potassium: 3.7 mmol/L (ref 3.5–5.1)
Sodium: 138 mmol/L (ref 135–145)

## 2020-01-04 MED ORDER — BUSPIRONE HCL 7.5 MG PO TABS
7.5000 mg | ORAL_TABLET | Freq: Two times a day (BID) | ORAL | 3 refills | Status: DC
Start: 2020-01-04 — End: 2020-05-05

## 2020-01-04 MED ORDER — CARVEDILOL 3.125 MG PO TABS: 3.1250 mg | ORAL_TABLET | Freq: Two times a day (BID) | ORAL | 3 refills | Status: DC

## 2020-01-04 NOTE — Progress Notes (Signed)
PCP: Patient, No Pcp Per Cardiology: Dr. Mayford Knifeurner HF Cardiology: Dr. Shirlee LatchMcLean  29 y.o. with history of polysubstance abuse (cocaine, heroin, methamphetamines) and smoking, endocarditis, and prolonged hospitalization 3/21-6/21 was referred by Dr. Mayford Knifeurner for evaluation of new onset cardiomyopathy.  Patient was admitted to [facility] in 3/21 with left MCA CVA. She had revascularization of the MCA by IR, procedure complicated by left carotid dissection treated with a stent. TEE showed endocarditis => large tricuspid regurgitation with mild to moderate TR, small MV vegetation with mild MR, and aortic valve vegetation (bicuspid valve) with severe AI.  Blood cultures remained negative, she was treated with empiric abx. She had septic emboli to the brain, kidneys, spleen, and lungs.  On 04/28/19, she had bioprosthetic AVR and debridement + repair of the tricuspid valve. She additionally had a right CFV DVT.  As she was not able to be anticoagulated with septic emboli to the brain as well as the carotid dissection, she had an IVC filter placed.  After a prolonged course, she was discharged in 6/21.  She is now living at home with her mother.  She has not smoked or used drugs since she was admitted in 3/21.    Echo was done in 9/21, this showed no vegetations but EF was down to 20-25% with severely decreased RV systolic function, moderate MR, moderate TR s/p TV repair, bioprosthetic AoV functioning normally.  Of note, intra-op TEE at time of valve surgery showed normal EF. She did not have a post-op echo until 9/21. She has no history of cardiomyopathy or MI in her family.   In 9/21, she had RHC/LHC showing no CAD, markedly elevated filling pressures and low cardiac output.  There was a fistulous connection from the aortic root to the left ventricle.  She was admitted and started on milrinone and diuresed.  TEE was done, showing EF 25%, moderately decreased RV systolic function, bioprosthetic valve with mean gradient 15 mmHg,  serpiginous perivalvular leakage from aortic root to LV, appeared moderate in severity.  Cardiac MRI showed LV EF 18%, RV EF 21%, nonspecific RV insertion site LGE.   Today she returns for HF follow up.Overall feeling fine. Not moving around much except she goes grocery shopping. Occasionally dizzy when standing. Mild SOB with inclines. Denies PND/Orthopnea. Appetite ok. No fever or chills. Weight at home 189-200 pounds. Drinking lots of water.  Taking all medications. Has Murena implant but expired.   Labs (5/21): creatinine 0.73 Labs (10/21): K 3.5, creatinine 0.95, digoxin level 0.6 Labs (12/07/2019): K 4.2 Creatinine 0.9   REDS clip 31%  PMH: 1. Polysubstance abuse: Cocaine, heroin, methamphetamines.  None since 3/21. 2. Prior smoker: Stopped 3/21.  3. CVA: 3/21, septic embolus.  She had MCA revascularization by IR.  - Left ICA dissection as complication of procedure requiring stent placement.  4. Endocarditis: 3/21.  Culture negative.  She had septic emboli to brain, kidney, spleen and lungs.  - TEE (3/21): EF 55-60%, normal RV, small MV vegetation with mild MR, large TV vegetation with mild-moderate TR, bicuspid aortic valve with aortic valve endocarditis and severe AI.  - 04/28/19 she had bioprosthetic AVR and debridement + repair of tricuspid valve.  5. DVT (3/21): IVC filter => not candidate for anticoagulation given septic emboli, dissection of carotid with IR procedure initially.  6. Chronic systolic CHF: Echo in 9/21 showed EF 20-25%, global hypokinesis, severely decreased RV systolic function, moderate MR, moderate TR s/p TV repair, bioprosthetic aortic valve with mean gradient 10 mmHg and no  regurgitation; no vegetation noted. Most recent prior echo was the TEE done at the time of surgery in 4/21, EF was normal then.  - LHC/RHC (9/21): No signifcant CAD; mean RA 14, PA 56/26, mean PCWP 28, CI 1.62, PVR 3.7.  Fistulous connection aortic root to LV.  - TEE (9/21): EF 25%, moderately  decreased RV systolic function, bioprosthetic aortic valve with mean gradient 15 mmHg, serpiginous perivalvular leakage, moderate in severity.  - Cardiac MRI: Nonspecific RV insertion site LGE. LV EF 18%, RV EF 21%.  7. 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   Social History   Socioeconomic History  . Marital status: Single    Spouse name: Not on file  . Number of children: Not on file  . Years of education: Not on file  . Highest education level: Not on file  Occupational History  . Not on file  Tobacco Use  . Smoking status: Former Smoker    Quit date: 03/10/2019    Years since quitting: 0.8  . Smokeless tobacco: Never Used  Vaping Use  . Vaping Use: Unknown  Substance and Sexual Activity  . Alcohol use: Not Currently  . Drug use: Not Currently  . Sexual activity: Not on file  Other Topics Concern  . Not on file  Social History Narrative  . Not on file   Social Determinants of Health   Financial Resource Strain: Not on file  Food Insecurity: Not on file  Transportation Needs: Not on file  Physical Activity: Not on file  Stress: Not on file  Social Connections: Not on file  Intimate Partner Violence: Not on file   Family History  Problem Relation Age of Onset  . Heart murmur Brother   . CAD Neg Hx   . Heart failure Neg Hx    ROS: All systems reviewed and negative except as per HPI.   Current Outpatient Medications  Medication Sig Dispense Refill  . aspirin EC 81 MG EC tablet Take 1 tablet (81 mg total) by mouth daily. 90 tablet 1  . clopidogrel (PLAVIX) 75 MG tablet Take 1 tablet (75 mg total) by mouth daily. 90 tablet 11  . dapagliflozin propanediol (FARXIGA) 10 MG TABS tablet Take 1 tablet (10 mg total) by mouth daily before breakfast. 30 tablet 3  . digoxin (LANOXIN) 0.125 MG tablet Take 1 tablet (0.125 mg total) by mouth daily. 90 tablet 3  . escitalopram (LEXAPRO) 10 MG tablet Take 1 tablet (10 mg total) by mouth daily. 90  tablet 1  . furosemide (LASIX) 40 MG tablet Take 1 tablet (40 mg total) by mouth 2 (two) times daily. 180 tablet 3  . potassium chloride SA (KLOR-CON) 20 MEQ tablet Take 1 tablet (20 mEq total) by mouth daily. 30 tablet 1  . sacubitril-valsartan (ENTRESTO) 24-26 MG Take 1 tablet by mouth 2 (two) times daily. 60 tablet 3  . spironolactone (ALDACTONE) 25 MG tablet Take 1 tablet (25 mg total) by mouth daily. 90 tablet 3   No current facility-administered medications for this encounter.   BP 100/78   Pulse 70   Wt 88 kg (194 lb)   SpO2 98%   BMI 30.38 kg/m   Wt Readings from Last 3 Encounters:  01/04/20 88 kg  11/23/19 85.3 kg  11/10/19 83 kg    General:  Well appearing. No resp difficulty HEENT: normal Neck: supple. no JVD. Carotids 2+ bilat; no bruits. No lymphadenopathy or thryomegaly appreciated. Cor: PMI nondisplaced. Regular rate &  rhythm. No rubs, gallops or murmurs. Lungs: clear Abdomen: soft, nontender, nondistended. No hepatosplenomegaly. No bruits or masses. Good bowel sounds. Extremities: no cyanosis, clubbing, rash, edema Neuro: alert & orientedx3, cranial nerves grossly intact. moves all 4 extremities w/o difficulty. Affect pleasant  Assessment/Plan: 1.Chronic systolic CHF: 9/21 echo showed no vegetations but EF was down to 20-25% with severely decreased RV systolic function, moderate MR, moderate TR s/p TV repair, bioprosthetic AoV functioning normally(but there is evidence of peri-valvular leakage). Of note, intra-op TEE at time of valve surgery showed normal EF. She did not have a post-op echo until 9/21. Now with biventricular failure. Cause is uncertain, ?viral myocarditis ("cold" in 8/21), ?LV damage from severe aortic insufficiency now more obvious s/p AVR (but would not explain severe RV dysfunction), ?volume load from aortic root => LV fistula. RHC in 9/21 showed significantly elevated PCWP and RA pressure, cardiac index was low at 1.6. Cardiac MRI showed  nonspecific RV insertion site LGE.    - Not sexually active. She does have an implant but says it has expired. Asked her to follow up with GYN. Discussed importance of pregnancy prevention.   -NYHA II. Volume status stable. Continue Lasix 40 mg twice a day.  - Add coreg 3.125 mg twice a day. Consider increasing at next visit.  -Continue  Entresto 24/26 bid.   -Continuespironolactone 25  daily.  - Digoxin 0.125 daily, dig level 0.6 11/04/19  - ECHO next visit.  2. H/o endocarditis: Culture negative. 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 TEE did not appear to show active vegetation.  3. Bicuspid aortic valve disorder: Now s/p bioprosthetic AVR. Thoracic aorta not dilated on 3/21 CT.  4. H/o R CFV DVT: Has IVC filter, think ok to remove at this point.  5. H/o CVA: Had left MCA revascularization by IR, complicated by left ICA dissection and stent placement.  - On ASA 81 + Plavix for stent.  6. Polysubstance abuse: No drugs, ETOH, or smoking since 3/21. 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 Suspect this is a sequelae of prior endocarditis. No fever, blood cultures so far negative.  TEE showed aortic root to LV peri-valvular leakage with a serpiginous course, suspect no more than moderate in severity.  Does not explain extent of her cardiomyopathy.  Discussed with Dr. Excell Seltzer, will follow for now with serial echoes.   Follow up in 3 weeks with pharmacy and 6 weeks with Dr Shirlee Latch and an ECHO.Check BMET   Amy Clegg NP-C  01/04/2020  Patient seen with NP, agree with the above note.   She is doing well symptomatically, NYHA class II.  Taking all meds.  BP soft but not getting lightheaded.  Weight is up but REDS clip not elevated.   General: NAD Neck: No JVD, no thyromegaly or thyroid nodule.  Lungs: Clear to auscultation bilaterally with normal respiratory  effort. CV: Nondisplaced PMI.  Heart regular S1/S2, no S3/S4, 2/6 SEM RUSB.  No peripheral edema.  No carotid bruit.  Normal pedal pulses.  Abdomen: Soft, nontender, no hepatosplenomegaly, no distention.  Skin: Intact without lesions or rashes.  Neurologic: Alert and oriented x 3.  Psych: Normal affect. Extremities: No clubbing or cyanosis.  HEENT: Normal.   Continue current regimen and add Coreg 3.125 mg bid.  Not sure that BP will allow further medication titration. She will need repeat echo in 6 wks;  if EF is still < 35%, will need to discuss ICD.   Discussed need for contraception with her current medication regimen.  She is going to talk with her gynecologist.   Followup with HF pharmacist in 3 wks to see if meds can be titrated.  Followup with me in 6 wks with echo.   Marca Ancona 01/04/2020

## 2020-01-04 NOTE — Patient Instructions (Addendum)
START Carvedilol 3.125mg  (1 tablet) Twice daily  Labs done today, your results will be available in MyChart, we will contact you for abnormal readings.  Your physician recommends that you schedule a follow-up appointment in: 2-3 weeks with pharmacy  Your physician recommends that you schedule a follow-up appointment in: 6 weeks with Dr. Shirlee Latch with an echocardiogram  Your physician has requested that you have an echocardiogram. Echocardiography is a painless test that uses sound waves to create images of your heart. It provides your doctor with information about the size and shape of your heart and how well your hearts chambers and valves are working. This procedure takes approximately one hour. There are no restrictions for this procedure.  If you have any questions or concerns before your next appointment please send [place] a message through [place] or call our office at [phone].    TO LEAVE A MESSAGE FOR THE NURSE SELECT OPTION 2, PLEASE LEAVE A MESSAGE INCLUDING:  YOUR NAME  DATE OF BIRTH  CALL BACK NUMBER  REASON FOR CALL**this is important as we prioritize the call backs  YOU WILL RECEIVE A CALL BACK THE SAME DAY AS LONG AS YOU CALL BEFORE 4:00 PM

## 2020-01-04 NOTE — Progress Notes (Signed)
ReDS Vest / Clip - 01/04/20 1100      ReDS Vest / Clip   Station Marker C    Ruler Value 26    ReDS Value Range Low volume    ReDS Actual Value 31

## 2020-01-22 NOTE — Progress Notes (Incomplete)
***In Progress*** PCP: Patient, No Pcp Per Cardiology: Dr. Mayford Knife HF Cardiology: Dr. Shirlee Latch  HPI:  30 y.o. with history of polysubstance abuse (cocaine, heroin, methamphetamines) and smoking, endocarditis, and prolonged hospitalization 3/21-6/21 was referred by Dr. Mayford Knife for evaluation of new onset cardiomyopathy.  Patient was admitted to [facility] in 3/21 with left MCA CVA. She had revascularization of the MCA by IR, procedure complicated by left carotid dissection treated with a stent. TEE showed endocarditis => large tricuspid regurgitation with mild to moderate TR, small MV vegetation with mild MR, and aortic valve vegetation (bicuspid valve) with severe AI.  Blood cultures remained negative, she was treated with empiric abx. She had septic emboli to the brain, kidneys, spleen, and lungs.  On 04/28/19, she had bioprosthetic AVR and debridement + repair of the tricuspid valve. She additionally had a right CFV DVT.  As she was not able to be anticoagulated with septic emboli to the brain as well as the carotid dissection, she had an IVC filter placed.  After a prolonged course, she was discharged in 6/21.  She is now living at home with her mother.  She has not smoked or used drugs since she was admitted in 3/21.    Echo was done in 9/21, this showed no vegetations but EF was down to 20-25% with severely decreased RV systolic function, moderate MR, moderate TR s/p TV repair, bioprosthetic AoV functioning normally.  Of note, intra-op TEE at time of valve surgery showed normal EF. She did not have a post-op echo until 9/21. She has no history of cardiomyopathy or MI in her family.   In 9/21, she had RHC/LHC showing no CAD, markedly elevated filling pressures and low cardiac output.  There was a fistulous connection from the aortic root to the left ventricle.  She was admitted and started on milrinone and diuresed.  TEE was done, showing EF 25%, moderately decreased RV systolic function, bioprosthetic valve  with mean gradient 15 mmHg, serpiginous perivalvular leakage from aortic root to LV, appeared moderate in severity.  Cardiac MRI showed LV EF 18%, RV EF 21%, nonspecific RV insertion site LGE.   She recently returned to HF Clinic on 01/04/20 with Dr. Shirlee Latch. Overall was feeling fine. Not moving around much except she goes grocery shopping. Reported occasional dizziness when standing. Mild SOB with inclines. Denied PND/Orthopnea. Appetite was ok. No fever or chills. Weight at home was 189-200 pounds. Drinking lots of water.  Taking all medications. Has Mirena implant but expired.  Today he returns to HF clinic for pharmacist medication titration. At last visit with MD, carvedilol 3.125 mg BID was initiated.   Overall feeling ***. Dizziness, lightheadedness, fatigue:  Chest pain or palpitations:  How is your breathing?: *** SOB: Able to complete all ADLs. Activity level ***  Weight at home pounds. Takes furosemide/torsemide/bumex *** mg *** daily.  LEE PND/Orthopnea  Appetite *** Low-salt diet:   Physical Exam Cost/affordability of meds   HF Medications: Carvedilol 3.125 mg BID Entresto 24/26 mg BID Spironolactone 25 mg daily Dapagliflozin 10 mg daily Digoxin 0.125 mg daily Furosemide 40 mg BID Potassium chloride 20 mEq daily   Has the patient been experiencing any side effects to the medications prescribed?  no  Does the patient have any problems obtaining medications due to transportation or finances?   No - has traditional  Medicaid  Understanding of regimen: good Understanding of indications: good Potential of compliance: good Patient understands to avoid NSAIDs. Patient understands to avoid decongestants.  Pertinent Lab Values (01/04/20): Marland Kitchen Serum creatinine 0.99, BUN 21, Potassium 3.7, Sodium 138  Vital Signs: *** . Weight: 188 lbs (last clinic weight: 186.4 lbs) . Blood pressure: 110/78  . Heart rate: 91   Assessment: 1.Chronic systolic CHF: 9/21 echo  showed no vegetations but EF was down to 20-25% with severely decreased RV systolic function, moderate MR, moderate TR s/p TV repair, bioprosthetic AoV functioning normally(but there is evidence of peri-valvular leakage). Of note, intra-op TEE at time of valve surgery showed normal EF. She did not have a post-op echo until 9/21. Now with biventricular failure. Cause is uncertain, ?viral myocarditis ("cold" in 8/21), ?LV damage from severe aortic insufficiency now more obvious s/p AVR (but would not explain severe RV dysfunction), ?volume load from aortic root => LV fistula. RHC in 9/21 showed significantly elevated PCWP and RA pressure, cardiac index was low at 1.6. Cardiac MRI showed nonspecific RV insertion site LGE.   - NYHA class II, euvolemic on exam - Continue furosemide 40 mg BID.  - Continue carvedilol 3.125 mg BID - Continue Entresto 24/26 mg BID.   - Continue spironolactone 25 daily.  - Continue dapagliflozin 10 mg daily  - Continue Digoxin 0.125 daily. Last digoxin level 0.6 ng/mL on 11/04/2019. - Repeat echo 02/19/2020, ICD if EF remains low.  2. H/o endocarditis: Culture negative. 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 TEE did not appear to show active vegetation.  3. Bicuspid aortic valve disorder: Now s/p bioprosthetic AVR. Thoracic aorta not dilated on 3/21 CT.  4. H/o R CFV DVT: Has IVC filter, think ok to remove at this point.  5. H/o CVA: Had left MCA revascularization by IR, complicated by left ICA dissection and stent placement.  - On ASA 81 + Plavix for stent.  6. Polysubstance abuse: No drugs, ETOH, or smoking since 3/21. 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 Suspect this is a sequelae of prior endocarditis. No fever, blood cultures so far negative. TEE showed aortic root to LV peri-valvular leakage with a serpiginous course, suspect no more  thanmoderate in severity.Does not explain extent of her cardiomyopathy. Discussed with Dr. Excell Seltzer, will follow for now with serial echoes.    Plan: 1) Medication changes: Based on clinical presentation, vital signs and recent labs will *** 3) Follow-up: 1 month with Dr. Shirlee Latch***   [name], PharmD, BCPS, [facility], CPP Heart Failure Clinic Pharmacist [phone]

## 2020-01-25 ENCOUNTER — Inpatient Hospital Stay (HOSPITAL_COMMUNITY): Admission: RE | Admit: 2020-01-25 | Payer: Medicaid Other | Source: Ambulatory Visit

## 2020-02-03 ENCOUNTER — Inpatient Hospital Stay (HOSPITAL_COMMUNITY): Admission: RE | Admit: 2020-02-03 | Payer: Medicaid Other | Source: Ambulatory Visit

## 2020-02-18 ENCOUNTER — Ambulatory Visit (HOSPITAL_COMMUNITY): Payer: Medicaid Other

## 2020-02-19 ENCOUNTER — Other Ambulatory Visit: Payer: Self-pay

## 2020-02-19 ENCOUNTER — Ambulatory Visit (HOSPITAL_BASED_OUTPATIENT_CLINIC_OR_DEPARTMENT_OTHER)
Admission: RE | Admit: 2020-02-19 | Discharge: 2020-02-19 | Disposition: A | Discharge status: Home / Self Care | Payer: Medicaid Other | Source: Ambulatory Visit | Attending: Cardiology | Admitting: Cardiology

## 2020-02-19 ENCOUNTER — Encounter (HOSPITAL_COMMUNITY): Payer: Self-pay | Admitting: Cardiology

## 2020-02-19 ENCOUNTER — Ambulatory Visit (HOSPITAL_COMMUNITY)
Admission: RE | Admit: 2020-02-19 | Discharge: 2020-02-19 | Disposition: A | Discharge status: Home / Self Care | Payer: Medicaid Other | Source: Ambulatory Visit | Attending: Cardiology | Admitting: Cardiology

## 2020-02-19 VITALS — BP 120/70 | HR 60 | Wt 208.8 lb

## 2020-02-19 DIAGNOSIS — I350 Nonrheumatic aortic (valve) stenosis: Secondary | ICD-10-CM | POA: Diagnosis not present

## 2020-02-19 DIAGNOSIS — Z86718 Personal history of other venous thrombosis and embolism: Secondary | ICD-10-CM | POA: Diagnosis not present

## 2020-02-19 DIAGNOSIS — Z953 Presence of xenogenic heart valve: Secondary | ICD-10-CM

## 2020-02-19 DIAGNOSIS — Z8673 Personal history of transient ischemic attack (TIA), and cerebral infarction without residual deficits: Secondary | ICD-10-CM | POA: Diagnosis not present

## 2020-02-19 DIAGNOSIS — I5022 Chronic systolic (congestive) heart failure: Secondary | ICD-10-CM

## 2020-02-19 DIAGNOSIS — Z7902 Long term (current) use of antithrombotics/antiplatelets: Secondary | ICD-10-CM | POA: Insufficient documentation

## 2020-02-19 DIAGNOSIS — Z7982 Long term (current) use of aspirin: Secondary | ICD-10-CM | POA: Insufficient documentation

## 2020-02-19 DIAGNOSIS — I083 Combined rheumatic disorders of mitral, aortic and tricuspid valves: Secondary | ICD-10-CM | POA: Insufficient documentation

## 2020-02-19 DIAGNOSIS — Z87891 Personal history of nicotine dependence: Secondary | ICD-10-CM | POA: Insufficient documentation

## 2020-02-19 DIAGNOSIS — Z09 Encounter for follow-up examination after completed treatment for conditions other than malignant neoplasm: Secondary | ICD-10-CM | POA: Diagnosis not present

## 2020-02-19 LAB — ECHOCARDIOGRAM COMPLETE
AR max vel: 0.87 cm2
AV Area VTI: 0.87 cm2
AV Area mean vel: 0.81 cm2
AV Mean grad: 47 mmHg
AV Peak grad: 71.2 mmHg
Ao pk vel: 4.22 m/s
Area-P 1/2: 4.89 cm2
MV M vel: 4.93 m/s
MV Peak grad: 97.2 mmHg
Radius: 0.2 cm
S' Lateral: 4.6 cm

## 2020-02-19 LAB — BASIC METABOLIC PANEL
Anion gap: 6 (ref 5–15)
BUN: 21 mg/dL — ABNORMAL HIGH (ref 6–20)
CO2: 23 mmol/L (ref 22–32)
Calcium: 8.8 mg/dL — ABNORMAL LOW (ref 8.9–10.3)
Chloride: 108 mmol/L (ref 98–111)
Creatinine, Ser: 0.88 mg/dL (ref 0.44–1.00)
GFR, Estimated: >60 mL/min (ref >60)
Glucose, Bld: 85 mg/dL (ref 70–99)
Potassium: 4.5 mmol/L (ref 3.5–5.1)
Sodium: 137 mmol/L (ref 135–145)

## 2020-02-19 MED ORDER — ENTRESTO 49-51 MG PO TABS: 1.0000 | ORAL_TABLET | Freq: Two times a day (BID) | ORAL | 3 refills | Status: DC

## 2020-02-19 NOTE — Progress Notes (Signed)
  Echocardiogram 2D Echocardiogram has been performed.  Lauren Reyes 02/19/2020, 3:07 PM

## 2020-02-19 NOTE — Patient Instructions (Addendum)
Labs done today. We will contact you only if your labs are abnormal.  No other medication changes were made. Please continue all current medications as prescribed.  INCREASE Entresto to 49-51mg  (1 tablet) by mouth daily.  Your physician recommends that you schedule a follow-up appointment in: 10 days for a lab only appointment and in 1 month for an appointment with our APP Clinic   If you have any questions or concerns before your next appointment please send [place] a message through [place] or call our office at [phone].    TO LEAVE A MESSAGE FOR THE NURSE SELECT OPTION 2, PLEASE LEAVE A MESSAGE INCLUDING: . YOUR NAME . DATE OF BIRTH . CALL BACK NUMBER . REASON FOR CALL**this is important as we prioritize the call backs  YOU WILL RECEIVE A CALL BACK THE SAME DAY AS LONG AS YOU CALL BEFORE 4:00 PM   Do the following things EVERYDAY: 1) Weigh yourself in the morning before breakfast. Write it down and keep it in a log. 2) Take your medicines as prescribed 3) Eat low salt foods--Limit salt (sodium) to 2000 mg per day.  4) Stay as active as you can everyday 5) Limit all fluids for the day to less than 2 liters   At the [facility], you and your health needs are our priority. As part of our continuing mission to provide you with exceptional heart care, we have created designated Provider Care Teams. These Care Teams include your primary Cardiologist (physician) and Advanced Practice Providers (APPs- Physician Assistants and Nurse Practitioners) who all work together to provide you with the care you need, when you need it.   You may see any of the following providers on your designated Care Team at your next follow up: Marland Kitchen Dr Arvilla Meres . Dr Marca Ancona . Tonye Becket, NP . Robbie Lis, PA . [name], PharmD   Please be sure to bring in all your medications bottles to every appointment.

## 2020-02-20 LAB — DIGOXIN LEVEL: Digoxin Level: <0.2 ng/mL — ABNORMAL LOW (ref 0.8–2.0)

## 2020-02-21 NOTE — Progress Notes (Signed)
PCP: Patient, No Pcp Per Cardiology: Dr. Mayford Knife HF Cardiology: Dr. Shirlee Latch  30 y.o. with history of polysubstance abuse (cocaine, heroin, methamphetamines) and smoking, endocarditis, and prolonged hospitalization 3/21-6/21 was referred by Dr. Mayford Knife for evaluation of new onset cardiomyopathy.  Patient was admitted to [facility] in 3/21 with left MCA CVA. She had revascularization of the MCA by IR, procedure complicated by left carotid dissection treated with a stent. TEE showed endocarditis => large tricuspid regurgitation with mild to moderate TR, small MV vegetation with mild MR, and aortic valve vegetation (bicuspid valve) with severe AI.  Blood cultures remained negative, she was treated with empiric abx. She had septic emboli to the brain, kidneys, spleen, and lungs.  On 04/28/19, she had bioprosthetic AVR and debridement + repair of the tricuspid valve. She additionally had a right CFV DVT.  As she was not able to be anticoagulated with septic emboli to the brain as well as the carotid dissection, she had an IVC filter placed.  After a prolonged course, she was discharged in 6/21.  She is now living at home with her mother.  She has not smoked or used drugs since she was admitted in 3/21.    Echo was done in 9/21, this showed no vegetations but EF was down to 20-25% with severely decreased RV systolic function, moderate MR, moderate TR s/p TV repair, bioprosthetic AoV functioning normally.  Of note, intra-op TEE at time of valve surgery showed normal EF. She did not have a post-op echo until 9/21. She has no history of cardiomyopathy or MI in her family.   In 9/21, she had RHC/LHC showing no CAD, markedly elevated filling pressures and low cardiac output.  There was a fistulous connection from the aortic root to the left ventricle.  She was admitted and started on milrinone and diuresed.  TEE was done, showing EF 25%, moderately decreased RV systolic function, bioprosthetic valve with mean gradient 15 mmHg,  serpiginous perivalvular leakage from aortic root to LV, appeared moderate in severity.  Cardiac MRI showed LV EF 18%, RV EF 21%, nonspecific RV insertion site LGE.   Echo was done today and reviewed, EF 40-45% with mild LV dilation, mild MR, s/p TV repair, s/p bioprosthetic AVR with mean gradient markedly higher than in the past at 47 mmHg with trivial AI.    Today she returns for HF follow up.  Weight is up 14 lbs.  She ascribes this to poor diet.  Symptomatically, she is actually feeling well.  She is not short of breath walking on flat ground and tries to walk 30-45 minutes/day.  No orthopnea/PND.  No chest pain.  No palpitations.  Occasional orthostatic symptoms but no syncope or falls.  Taking all her meds.    Labs (5/21): creatinine 0.73 Labs (10/21): K 3.5, creatinine 0.95, digoxin level 0.6 Labs (12/07/2019): K 4.2 Creatinine 0.9  Labs (12/21): K 3.7, creatinine 0.94  ECG (personally reviewed): NSR, nonspecific TW flattening  PMH: 1. Polysubstance abuse: Cocaine, heroin, methamphetamines.  None since 3/21. 2. Prior smoker: Stopped 3/21.  3. CVA: 3/21, septic embolus.  She had MCA revascularization by IR.  - Left ICA dissection as complication of procedure requiring stent placement.  4. Endocarditis: 3/21.  Culture negative.  She had septic emboli to brain, kidney, spleen and lungs.  - TEE (3/21): EF 55-60%, normal RV, small MV vegetation with mild MR, large TV vegetation with mild-moderate TR, bicuspid aortic valve with aortic valve endocarditis and severe AI.  - 04/28/19 she had  bioprosthetic AVR and debridement + repair of tricuspid valve.  5. DVT (3/21): IVC filter => not candidate for anticoagulation given septic emboli, dissection of carotid with IR procedure initially.  6. Chronic systolic CHF: Echo in 9/21 showed EF 20-25%, global hypokinesis, severely decreased RV systolic function, moderate MR, moderate TR s/p TV repair, bioprosthetic aortic valve with mean gradient 10 mmHg and  no regurgitation; no vegetation noted. Most recent prior echo was the TEE done at the time of surgery in 4/21, EF was normal then.  - LHC/RHC (9/21): No signifcant CAD; mean RA 14, PA 56/26, mean PCWP 28, CI 1.62, PVR 3.7.  Fistulous connection aortic root to LV.  - TEE (9/21): EF 25%, moderately decreased RV systolic function, bioprosthetic aortic valve with mean gradient 15 mmHg, serpiginous perivalvular leakage, moderate in severity.  - Cardiac MRI: Nonspecific RV insertion site LGE. LV EF 18%, RV EF 21%.  - Echo (2/22): EF 40-45% with mild LV dilation, mild MR, s/p TV repair, s/p bioprosthetic AVR with mean gradient markedly higher than in the past at 47 mmHg with trivial AI.   7. 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   Social History   Socioeconomic History  . Marital status: Single    Spouse name: Not on file  . Number of children: Not on file  . Years of education: Not on file  . Highest education level: Not on file  Occupational History  . Not on file  Tobacco Use  . Smoking status: Former Smoker    Quit date: 03/10/2019    Years since quitting: 0.9  . Smokeless tobacco: Never Used  Vaping Use  . Vaping Use: Unknown  Substance and Sexual Activity  . Alcohol use: Not Currently  . Drug use: Not Currently  . Sexual activity: Not on file  Other Topics Concern  . Not on file  Social History Narrative  . Not on file   Social Determinants of Health   Financial Resource Strain: Not on file  Food Insecurity: Not on file  Transportation Needs: Not on file  Physical Activity: Not on file  Stress: Not on file  Social Connections: Not on file  Intimate Partner Violence: Not on file   Family History  Problem Relation Age of Onset  . Heart murmur Brother   . CAD Neg Hx   . Heart failure Neg Hx    ROS: All systems reviewed and negative except as per HPI.   Current Outpatient Medications  Medication Sig Dispense Refill  . aspirin EC 81 MG EC  tablet Take 1 tablet (81 mg total) by mouth daily. 90 tablet 1  . busPIRone (BUSPAR) 7.5 MG tablet Take 1 tablet (7.5 mg total) by mouth 2 (two) times daily. 60 tablet 3  . carvedilol (COREG) 3.125 MG tablet Take 1 tablet (3.125 mg total) by mouth 2 (two) times daily. 60 tablet 3  . clopidogrel (PLAVIX) 75 MG tablet Take 1 tablet (75 mg total) by mouth daily. 90 tablet 11  . dapagliflozin propanediol (FARXIGA) 10 MG TABS tablet Take 1 tablet (10 mg total) by mouth daily before breakfast. 30 tablet 3  . digoxin (LANOXIN) 0.125 MG tablet Take 1 tablet (0.125 mg total) by mouth daily. 90 tablet 3  . escitalopram (LEXAPRO) 10 MG tablet Take 1 tablet (10 mg total) by mouth daily. 90 tablet 1  . furosemide (LASIX) 40 MG tablet Take 1 tablet (40 mg total) by mouth 2 (two) times daily. 180 tablet  3  . potassium chloride SA (KLOR-CON) 20 MEQ tablet Take 1 tablet (20 mEq total) by mouth daily. 30 tablet 1  . sacubitril-valsartan (ENTRESTO) 49-51 MG Take 1 tablet by mouth 2 (two) times daily. 180 tablet 3  . spironolactone (ALDACTONE) 25 MG tablet Take 1 tablet (25 mg total) by mouth daily. 90 tablet 3   No current facility-administered medications for this encounter.   BP 120/70   Pulse 60   Wt 94.7 kg (208 lb 12.8 oz)   SpO2 99%   BMI 32.70 kg/m   Wt Readings from Last 3 Encounters:  02/19/20 94.7 kg (208 lb 12.8 oz)  01/04/20 88 kg (194 lb)  11/23/19 85.3 kg (188 lb)   General: NAD Neck: No JVD, no thyromegaly or thyroid nodule.  Lungs: Clear to auscultation bilaterally with normal respiratory effort. CV: Nondisplaced PMI.  Heart regular S1/S2, no S3/S4, 3/6 SEM RUSB.  No peripheral edema.  No carotid bruit.  Normal pedal pulses.  Abdomen: Soft, nontender, no hepatosplenomegaly, no distention.  Skin: Intact without lesions or rashes.  Neurologic: Alert and oriented x 3.  Psych: Normal affect. Extremities: No clubbing or cyanosis.  HEENT: Normal.    Assessment/Plan: 1.Chronic systolic  CHF: 9/21 echo showed no vegetations but EF was down to 20-25% with severely decreased RV systolic function, moderate MR, moderate TR s/p TV repair, bioprosthetic AoV functioning normally(but there is evidence of peri-valvular leakage). Of note, intra-op TEE at time of valve surgery showed normal EF. She did not have a post-op echo until 9/21. Cause of biventricular failure is uncertain, ?viral myocarditis ("cold" in 8/21), ?volume load from aortic root => LV fistula. RHC in 9/21 showed significantly elevated PCWP and RA pressure, cardiac index was low at 1.6. Cardiac MRI showed nonspecific RV insertion site LGE. Echo today showed improved LV function to EF 40-45% but now concerning for severe bioprosthetic stenosis (?bioprosthetic valve thrombosis).  NYHA class II symptoms, she is actually doing well symptomatically.  Not volume overloaded on exam.  - Discussed importance of pregnancy prevention with current meds.   - Continue Lasix 40 mg bid.  - Continue Core 3.125 mg bid.  - Increase Entresto to 49/51 bid, BMET today and in 10 days.   -Continuespironolactone 25 daily.  - Digoxin 0.125 daily, check digoxin level.  2. H/o endocarditis: Culture negative. 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 TEE did not appear to show active vegetation.  3. Bicuspid aortic valve disorder: Now s/p bioprosthetic AVR. Thoracic aorta not dilated on 3/21 CT.  4. H/o R CFV DVT: Has IVC filter, think ok to remove at this point.  5. H/o CVA: Had left MCA revascularization by IR, complicated by left ICA dissection and stent placement.  - On ASA 81 + Plavix for stent.  6. Polysubstance abuse: No drugs, ETOH, or smoking since 3/21. 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 Suspect this is a sequelae of prior endocarditis. No fever, blood cultures so far negative.  TEE in 9/21 showed aortic root to  LV peri-valvular leakage with a serpiginous course, suspect no more than moderate in severity.  Does not explain extent of her cardiomyopathy.  Discussed with Dr. Excell Seltzerooper, will follow for now with serial echoes.  8. Bioprosthetic valve stenosis: Echo today was not impressive for significant peri-valvular AI, but showed markedly increased aortic valve mean gradient (up to 47 mmHg).  The valve was not well-visualized.  I am concerned for bioprosthetic valve partial thrombosis (though she is minimally symptomatic).   - I will arrange for repeat TEE to reassess the bioprosthetic aortic valve (discussed risks/benefits with patient and she agrees to proceed).   - If findings on TEE are suggestive of bioprosthetic valve thrombosis, will stop Plavix (about 1 year since stent) and start her on warfarin in addition to ASA 81.   Followup in 1 month.  Lauren Reyes  02/21/2020

## 2020-02-21 NOTE — H&P (View-Only) (Signed)
PCP: Patient, No Pcp Per Cardiology: Dr. Mayford Knife HF Cardiology: Dr. Shirlee Latch  30 y.o. with history of polysubstance abuse (cocaine, heroin, methamphetamines) and smoking, endocarditis, and prolonged hospitalization 3/21-6/21 was referred by Dr. Mayford Knife for evaluation of new onset cardiomyopathy.  Patient was admitted to [facility] in 3/21 with left MCA CVA. She had revascularization of the MCA by IR, procedure complicated by left carotid dissection treated with a stent. TEE showed endocarditis => large tricuspid regurgitation with mild to moderate TR, small MV vegetation with mild MR, and aortic valve vegetation (bicuspid valve) with severe AI.  Blood cultures remained negative, she was treated with empiric abx. She had septic emboli to the brain, kidneys, spleen, and lungs.  On 04/28/19, she had bioprosthetic AVR and debridement + repair of the tricuspid valve. She additionally had a right CFV DVT.  As she was not able to be anticoagulated with septic emboli to the brain as well as the carotid dissection, she had an IVC filter placed.  After a prolonged course, she was discharged in 6/21.  She is now living at home with her mother.  She has not smoked or used drugs since she was admitted in 3/21.    Echo was done in 9/21, this showed no vegetations but EF was down to 20-25% with severely decreased RV systolic function, moderate MR, moderate TR s/p TV repair, bioprosthetic AoV functioning normally.  Of note, intra-op TEE at time of valve surgery showed normal EF. She did not have a post-op echo until 9/21. She has no history of cardiomyopathy or MI in her family.   In 9/21, she had RHC/LHC showing no CAD, markedly elevated filling pressures and low cardiac output.  There was a fistulous connection from the aortic root to the left ventricle.  She was admitted and started on milrinone and diuresed.  TEE was done, showing EF 25%, moderately decreased RV systolic function, bioprosthetic valve with mean gradient 15 mmHg,  serpiginous perivalvular leakage from aortic root to LV, appeared moderate in severity.  Cardiac MRI showed LV EF 18%, RV EF 21%, nonspecific RV insertion site LGE.   Echo was done today and reviewed, EF 40-45% with mild LV dilation, mild MR, s/p TV repair, s/p bioprosthetic AVR with mean gradient markedly higher than in the past at 47 mmHg with trivial AI.    Today she returns for HF follow up.  Weight is up 14 lbs.  She ascribes this to poor diet.  Symptomatically, she is actually feeling well.  She is not short of breath walking on flat ground and tries to walk 30-45 minutes/day.  No orthopnea/PND.  No chest pain.  No palpitations.  Occasional orthostatic symptoms but no syncope or falls.  Taking all her meds.    Labs (5/21): creatinine 0.73 Labs (10/21): K 3.5, creatinine 0.95, digoxin level 0.6 Labs (12/07/2019): K 4.2 Creatinine 0.9  Labs (12/21): K 3.7, creatinine 0.94  ECG (personally reviewed): NSR, nonspecific TW flattening  PMH: 1. Polysubstance abuse: Cocaine, heroin, methamphetamines.  None since 3/21. 2. Prior smoker: Stopped 3/21.  3. CVA: 3/21, septic embolus.  She had MCA revascularization by IR.  - Left ICA dissection as complication of procedure requiring stent placement.  4. Endocarditis: 3/21.  Culture negative.  She had septic emboli to brain, kidney, spleen and lungs.  - TEE (3/21): EF 55-60%, normal RV, small MV vegetation with mild MR, large TV vegetation with mild-moderate TR, bicuspid aortic valve with aortic valve endocarditis and severe AI.  - 04/28/19 she had  bioprosthetic AVR and debridement + repair of tricuspid valve.  5. DVT (3/21): IVC filter => not candidate for anticoagulation given septic emboli, dissection of carotid with IR procedure initially.  6. Chronic systolic CHF: Echo in 9/21 showed EF 20-25%, global hypokinesis, severely decreased RV systolic function, moderate MR, moderate TR s/p TV repair, bioprosthetic aortic valve with mean gradient 10 mmHg and  no regurgitation; no vegetation noted. Most recent prior echo was the TEE done at the time of surgery in 4/21, EF was normal then.  - LHC/RHC (9/21): No signifcant CAD; mean RA 14, PA 56/26, mean PCWP 28, CI 1.62, PVR 3.7.  Fistulous connection aortic root to LV.  - TEE (9/21): EF 25%, moderately decreased RV systolic function, bioprosthetic aortic valve with mean gradient 15 mmHg, serpiginous perivalvular leakage, moderate in severity.  - Cardiac MRI: Nonspecific RV insertion site LGE. LV EF 18%, RV EF 21%.  - Echo (2/22): EF 40-45% with mild LV dilation, mild MR, s/p TV repair, s/p bioprosthetic AVR with mean gradient markedly higher than in the past at 47 mmHg with trivial AI.   7. 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   Social History   Socioeconomic History  . Marital status: Single    Spouse name: Not on file  . Number of children: Not on file  . Years of education: Not on file  . Highest education level: Not on file  Occupational History  . Not on file  Tobacco Use  . Smoking status: Former Smoker    Quit date: 03/10/2019    Years since quitting: 0.9  . Smokeless tobacco: Never Used  Vaping Use  . Vaping Use: Unknown  Substance and Sexual Activity  . Alcohol use: Not Currently  . Drug use: Not Currently  . Sexual activity: Not on file  Other Topics Concern  . Not on file  Social History Narrative  . Not on file   Social Determinants of Health   Financial Resource Strain: Not on file  Food Insecurity: Not on file  Transportation Needs: Not on file  Physical Activity: Not on file  Stress: Not on file  Social Connections: Not on file  Intimate Partner Violence: Not on file   Family History  Problem Relation Age of Onset  . Heart murmur Brother   . CAD Neg Hx   . Heart failure Neg Hx    ROS: All systems reviewed and negative except as per HPI.   Current Outpatient Medications  Medication Sig Dispense Refill  . aspirin EC 81 MG EC  tablet Take 1 tablet (81 mg total) by mouth daily. 90 tablet 1  . busPIRone (BUSPAR) 7.5 MG tablet Take 1 tablet (7.5 mg total) by mouth 2 (two) times daily. 60 tablet 3  . carvedilol (COREG) 3.125 MG tablet Take 1 tablet (3.125 mg total) by mouth 2 (two) times daily. 60 tablet 3  . clopidogrel (PLAVIX) 75 MG tablet Take 1 tablet (75 mg total) by mouth daily. 90 tablet 11  . dapagliflozin propanediol (FARXIGA) 10 MG TABS tablet Take 1 tablet (10 mg total) by mouth daily before breakfast. 30 tablet 3  . digoxin (LANOXIN) 0.125 MG tablet Take 1 tablet (0.125 mg total) by mouth daily. 90 tablet 3  . escitalopram (LEXAPRO) 10 MG tablet Take 1 tablet (10 mg total) by mouth daily. 90 tablet 1  . furosemide (LASIX) 40 MG tablet Take 1 tablet (40 mg total) by mouth 2 (two) times daily. 180 tablet  3  . potassium chloride SA (KLOR-CON) 20 MEQ tablet Take 1 tablet (20 mEq total) by mouth daily. 30 tablet 1  . sacubitril-valsartan (ENTRESTO) 49-51 MG Take 1 tablet by mouth 2 (two) times daily. 180 tablet 3  . spironolactone (ALDACTONE) 25 MG tablet Take 1 tablet (25 mg total) by mouth daily. 90 tablet 3   No current facility-administered medications for this encounter.   BP 120/70   Pulse 60   Wt 94.7 kg (208 lb 12.8 oz)   SpO2 99%   BMI 32.70 kg/m   Wt Readings from Last 3 Encounters:  02/19/20 94.7 kg (208 lb 12.8 oz)  01/04/20 88 kg (194 lb)  11/23/19 85.3 kg (188 lb)   General: NAD Neck: No JVD, no thyromegaly or thyroid nodule.  Lungs: Clear to auscultation bilaterally with normal respiratory effort. CV: Nondisplaced PMI.  Heart regular S1/S2, no S3/S4, 3/6 SEM RUSB.  No peripheral edema.  No carotid bruit.  Normal pedal pulses.  Abdomen: Soft, nontender, no hepatosplenomegaly, no distention.  Skin: Intact without lesions or rashes.  Neurologic: Alert and oriented x 3.  Psych: Normal affect. Extremities: No clubbing or cyanosis.  HEENT: Normal.    Assessment/Plan: 1.Chronic systolic  CHF: 9/21 echo showed no vegetations but EF was down to 20-25% with severely decreased RV systolic function, moderate MR, moderate TR s/p TV repair, bioprosthetic AoV functioning normally(but there is evidence of peri-valvular leakage). Of note, intra-op TEE at time of valve surgery showed normal EF. She did not have a post-op echo until 9/21. Cause of biventricular failure is uncertain, ?viral myocarditis ("cold" in 8/21), ?volume load from aortic root => LV fistula. RHC in 9/21 showed significantly elevated PCWP and RA pressure, cardiac index was low at 1.6. Cardiac MRI showed nonspecific RV insertion site LGE. Echo today showed improved LV function to EF 40-45% but now concerning for severe bioprosthetic stenosis (?bioprosthetic valve thrombosis).  NYHA class II symptoms, she is actually doing well symptomatically.  Not volume overloaded on exam.  - Discussed importance of pregnancy prevention with current meds.   - Continue Lasix 40 mg bid.  - Continue Core 3.125 mg bid.  - Increase Entresto to 49/51 bid, BMET today and in 10 days.   -Continuespironolactone 25 daily.  - Digoxin 0.125 daily, check digoxin level.  2. H/o endocarditis: Culture negative. 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 TEE did not appear to show active vegetation.  3. Bicuspid aortic valve disorder: Now s/p bioprosthetic AVR. Thoracic aorta not dilated on 3/21 CT.  4. H/o R CFV DVT: Has IVC filter, think ok to remove at this point.  5. H/o CVA: Had left MCA revascularization by IR, complicated by left ICA dissection and stent placement.  - On ASA 81 + Plavix for stent.  6. Polysubstance abuse: No drugs, ETOH, or smoking since 3/21. 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 Suspect this is a sequelae of prior endocarditis. No fever, blood cultures so far negative.  TEE in 9/21 showed aortic root to  LV peri-valvular leakage with a serpiginous course, suspect no more than moderate in severity.  Does not explain extent of her cardiomyopathy.  Discussed with Dr. Cooper, will follow for now with serial echoes.  8. Bioprosthetic valve stenosis: Echo today was not impressive for significant peri-valvular AI, but showed markedly increased aortic valve mean gradient (up to 47 mmHg).    The valve was not well-visualized.  I am concerned for bioprosthetic valve partial thrombosis (though she is minimally symptomatic).   - I will arrange for repeat TEE to reassess the bioprosthetic aortic valve (discussed risks/benefits with patient and she agrees to proceed).   - If findings on TEE are suggestive of bioprosthetic valve thrombosis, will stop Plavix (about 1 year since stent) and start her on warfarin in addition to ASA 81.   Followup in 1 month.  Marca Ancona  02/21/2020

## 2020-02-22 ENCOUNTER — Other Ambulatory Visit (HOSPITAL_COMMUNITY)
Admission: RE | Admit: 2020-02-22 | Discharge: 2020-02-22 | Disposition: A | Discharge status: Home / Self Care | Payer: Medicaid Other | Source: Ambulatory Visit | Attending: Cardiology | Admitting: Cardiology

## 2020-02-22 DIAGNOSIS — Z01812 Encounter for preprocedural laboratory examination: Secondary | ICD-10-CM | POA: Diagnosis not present

## 2020-02-22 DIAGNOSIS — Z20822 Contact with and (suspected) exposure to covid-19: Secondary | ICD-10-CM | POA: Diagnosis not present

## 2020-02-23 ENCOUNTER — Other Ambulatory Visit (HOSPITAL_COMMUNITY): Payer: Self-pay | Admitting: *Deleted

## 2020-02-23 DIAGNOSIS — I350 Nonrheumatic aortic (valve) stenosis: Secondary | ICD-10-CM

## 2020-02-23 LAB — SARS CORONAVIRUS 2 (TAT 6-24 HRS): SARS Coronavirus 2: NEGATIVE

## 2020-02-24 ENCOUNTER — Encounter (HOSPITAL_COMMUNITY)
Admission: RE | Disposition: A | Discharge status: Home / Self Care | Payer: Self-pay | Source: Home / Self Care | Attending: Cardiology

## 2020-02-24 ENCOUNTER — Ambulatory Visit (HOSPITAL_COMMUNITY): Payer: Medicaid Other | Admitting: Anesthesiology

## 2020-02-24 ENCOUNTER — Ambulatory Visit: Payer: Medicaid Other | Admitting: Cardiology

## 2020-02-24 ENCOUNTER — Other Ambulatory Visit: Payer: Self-pay

## 2020-02-24 ENCOUNTER — Encounter (HOSPITAL_COMMUNITY): Payer: Self-pay | Admitting: Cardiology

## 2020-02-24 ENCOUNTER — Ambulatory Visit (HOSPITAL_COMMUNITY)
Admission: RE | Admit: 2020-02-24 | Discharge: 2020-02-24 | Disposition: A | Discharge status: Home / Self Care | Payer: Medicaid Other | Attending: Cardiology | Admitting: Cardiology

## 2020-02-24 ENCOUNTER — Ambulatory Visit (HOSPITAL_BASED_OUTPATIENT_CLINIC_OR_DEPARTMENT_OTHER): Payer: Medicaid Other

## 2020-02-24 DIAGNOSIS — Z7902 Long term (current) use of antithrombotics/antiplatelets: Secondary | ICD-10-CM | POA: Diagnosis not present

## 2020-02-24 DIAGNOSIS — I350 Nonrheumatic aortic (valve) stenosis: Secondary | ICD-10-CM

## 2020-02-24 DIAGNOSIS — Z87891 Personal history of nicotine dependence: Secondary | ICD-10-CM | POA: Insufficient documentation

## 2020-02-24 DIAGNOSIS — Z7982 Long term (current) use of aspirin: Secondary | ICD-10-CM | POA: Insufficient documentation

## 2020-02-24 DIAGNOSIS — I340 Nonrheumatic mitral (valve) insufficiency: Secondary | ICD-10-CM

## 2020-02-24 DIAGNOSIS — I5022 Chronic systolic (congestive) heart failure: Secondary | ICD-10-CM | POA: Insufficient documentation

## 2020-02-24 DIAGNOSIS — T8209XA Other mechanical complication of heart valve prosthesis, initial encounter: Secondary | ICD-10-CM | POA: Insufficient documentation

## 2020-02-24 DIAGNOSIS — Y848 Other medical procedures as the cause of abnormal reaction of the patient, or of later complication, without mention of misadventure at the time of the procedure: Secondary | ICD-10-CM | POA: Insufficient documentation

## 2020-02-24 DIAGNOSIS — Z79899 Other long term (current) drug therapy: Secondary | ICD-10-CM | POA: Diagnosis not present

## 2020-02-24 DIAGNOSIS — I351 Nonrheumatic aortic (valve) insufficiency: Secondary | ICD-10-CM | POA: Diagnosis not present

## 2020-02-24 HISTORY — PX: TEE WITHOUT CARDIOVERSION: SHX5443

## 2020-02-24 SURGERY — ECHOCARDIOGRAM, TRANSESOPHAGEAL
Anesthesia: Monitor Anesthesia Care

## 2020-02-24 MED ORDER — SODIUM CHLORIDE 0.9 % IV SOLN: INTRAVENOUS | Status: DC

## 2020-02-24 MED ORDER — LACTATED RINGERS IV SOLN: Freq: Once | INTRAVENOUS | Status: AC

## 2020-02-24 MED ORDER — PROPOFOL 10 MG/ML IV BOLUS
INTRAVENOUS | Status: DC | PRN
  Administered 2020-02-24: 30 mg via INTRAVENOUS

## 2020-02-24 MED ORDER — LIDOCAINE 2% (20 MG/ML) 5 ML SYRINGE
INTRAMUSCULAR | Status: DC | PRN
  Administered 2020-02-24: 60 mg via INTRAVENOUS

## 2020-02-24 MED ORDER — LACTATED RINGERS IV SOLN: INTRAVENOUS | Status: DC | PRN

## 2020-02-24 MED ORDER — PROPOFOL 500 MG/50ML IV EMUL
INTRAVENOUS | Status: DC | PRN
  Administered 2020-02-24: 75 ug/kg/min via INTRAVENOUS

## 2020-02-24 NOTE — Anesthesia Procedure Notes (Signed)
Procedure Name: MAC Date/Time: 02/24/2020 11:59 AM Performed by: Trinna Post., CRNA Pre-anesthesia Checklist: Patient identified, Emergency Drugs available, Suction available, Patient being monitored and Timeout performed Patient Re-evaluated:Patient Re-evaluated prior to induction Oxygen Delivery Method: Nasal cannula Preoxygenation: Pre-oxygenation with 100% oxygen Induction Type: IV induction Placement Confirmation: positive ETCO2

## 2020-02-24 NOTE — Anesthesia Preprocedure Evaluation (Addendum)
Anesthesia Evaluation  Patient identified by MRN, date of birth, ID band Patient awake    Reviewed: Allergy & Precautions, H&P , NPO status , Patient's Chart, lab work & pertinent test results  Airway Mallampati: II  TM Distance: >3 FB Neck ROM: Full    Dental no notable dental hx. (+) Teeth Intact, Dental Advisory Given   Pulmonary neg pulmonary ROS, former smoker,    Pulmonary exam normal breath sounds clear to auscultation       Cardiovascular +CHF  + Valvular Problems/Murmurs AS  Rhythm:Regular Rate:Normal     Neuro/Psych CVA negative psych ROS   GI/Hepatic negative GI ROS, (+)     substance abuse  ,   Endo/Other  negative endocrine ROS  Renal/GU negative Renal ROS  negative genitourinary   Musculoskeletal   Abdominal   Peds  Hematology  (+) Blood dyscrasia, anemia ,   Anesthesia Other Findings   Reproductive/Obstetrics negative OB ROS                            Anesthesia Physical Anesthesia Plan  ASA: IV  Anesthesia Plan: MAC   Post-op Pain Management:    Induction: Intravenous  PONV Risk Score and Plan: 2 and Propofol infusion and Treatment may vary due to age or medical condition  Airway Management Planned: Nasal Cannula  Additional Equipment:   Intra-op Plan:   Post-operative Plan:   Informed Consent: I have reviewed the patients History and Physical, chart, labs and discussed the procedure including the risks, benefits and alternatives for the proposed anesthesia with the patient or authorized representative who has indicated his/her understanding and acceptance.     Dental advisory given  Plan Discussed with: CRNA  Anesthesia Plan Comments:         Anesthesia Quick Evaluation

## 2020-02-24 NOTE — Transfer of Care (Signed)
Immediate Anesthesia Transfer of Care Note  Patient: Lauren Reyes  Procedure(s) Performed: TRANSESOPHAGEAL ECHOCARDIOGRAM (TEE) (N/A )  Patient Location: PACU and Endoscopy Unit  Anesthesia Type:MAC  Level of Consciousness: awake, alert  and oriented  Airway & Oxygen Therapy: Patient Spontanous Breathing  Post-op Assessment: Report given to RN and Post -op Vital signs reviewed and stable  Post vital signs: Reviewed and stable  Last Vitals:  Vitals Value Taken Time  BP    Temp    Pulse 65 02/24/20 1249  Resp 9 02/24/20 1249  SpO2 100 % 02/24/20 1249  Vitals shown include unvalidated device data.  Last Pain:  Vitals:   02/24/20 1131  TempSrc: Oral  PainSc: 0-No pain         Complications: No complications documented.

## 2020-02-24 NOTE — Progress Notes (Signed)
  Echocardiogram Echocardiogram Transesophageal has been performed.  Lauren Reyes 02/24/2020, 1:04 PM

## 2020-02-24 NOTE — Anesthesia Postprocedure Evaluation (Signed)
Anesthesia Post Note  Patient: Lauren Reyes  Procedure(s) Performed: TRANSESOPHAGEAL ECHOCARDIOGRAM (TEE) (N/A )     Patient location during evaluation: PACU Anesthesia Type: MAC Level of consciousness: awake and alert Pain management: pain level controlled Vital Signs Assessment: post-procedure vital signs reviewed and stable Respiratory status: spontaneous breathing, nonlabored ventilation and respiratory function stable Cardiovascular status: stable and blood pressure returned to baseline Postop Assessment: no apparent nausea or vomiting Anesthetic complications: no   No complications documented.  Last Vitals:  Vitals:   02/24/20 1300 02/24/20 1310  BP: (!) 114/93 136/69  Pulse: 90   Resp: 19   Temp:    SpO2: 93%     Last Pain:  Vitals:   02/24/20 1310  TempSrc:   PainSc: 0-No pain                 Kelleigh Skerritt,W. EDMOND

## 2020-02-24 NOTE — Discharge Instructions (Signed)

## 2020-02-24 NOTE — CV Procedure (Addendum)
Procedure: TEE  Indication: Bioprosthetic aortic valve malfunction  Sedation: Per anesthesiology.  Findings: Please see echo section for full report.  Mildly dilated left ventricle with EF 45%, diffuse hypokinesis.  Normal RV size and systolic function.  Mildly dilated left atrium, no LA appendage thrombus.  Normal right atrium.  The tricuspid valve is s/p repair with mild TR, peak RV-RA gradient 31 mmHg.  Mild mitral regurgitation.  There is a bioprosthetic aortic valve. Visually, the valve opens well with no obvious limitation.  There was peri-valvular leakage that traced a serpiginous course from the aortic root to the left ventricle, no more than moderate in severity.  Mean gradient 33 mmHg across the valve with calculated AVA 1.1 cm^2 and dimensionless index 0.5 by TEE images, after we had to remove probe due to loss of IV and sedation, able to get mean gradient 47 mmHg on TTE images with AVA 0.6 cm^2 and dimensionless index 0.26.  Normal caliber thoracic aorta with no significant plaque. No PFO/ASD by color doppler.    Elevated gradient across aortic valve does not appear to be due to valvular degeneration or partial thrombosis.  Suspect high flow with aortic insufficiency as well as possible degree of patient-prosthesis mismatch (valve appears relatively small).  Images limited as the patient lost her IV then woke up, so scope had to be removed.   Will review with colleagues.   Lauren Reyes 02/24/2020 12:38 PM

## 2020-02-24 NOTE — Interval H&P Note (Signed)
History and Physical Interval Note:  02/24/2020 12:06 PM  Lauren Reyes  has presented today for surgery, with the diagnosis of prosthetic valve dysfunction.  The various methods of treatment have been discussed with the patient and family. After consideration of risks, benefits and other options for treatment, the patient has consented to  Procedure(s): TRANSESOPHAGEAL ECHOCARDIOGRAM (TEE) (N/A) as a surgical intervention.  The patient's history has been reviewed, patient examined, no change in status, stable for surgery.  I have reviewed the patient's chart and labs.  Questions were answered to the patient's satisfaction.     Marylu Dudenhoeffer [employer]

## 2020-02-25 ENCOUNTER — Ambulatory Visit (HOSPITAL_COMMUNITY): Payer: Medicaid Other

## 2020-02-25 ENCOUNTER — Encounter (HOSPITAL_COMMUNITY): Payer: Self-pay | Admitting: Cardiology

## 2020-03-01 ENCOUNTER — Other Ambulatory Visit: Payer: Self-pay

## 2020-03-01 ENCOUNTER — Ambulatory Visit (HOSPITAL_COMMUNITY)
Admission: RE | Admit: 2020-03-01 | Discharge: 2020-03-01 | Disposition: A | Discharge status: Home / Self Care | Payer: Medicaid Other | Source: Ambulatory Visit | Attending: Cardiology | Admitting: Cardiology

## 2020-03-01 DIAGNOSIS — I5022 Chronic systolic (congestive) heart failure: Secondary | ICD-10-CM

## 2020-03-01 LAB — BASIC METABOLIC PANEL
Anion gap: 10 (ref 5–15)
BUN: 17 mg/dL (ref 6–20)
CO2: 27 mmol/L (ref 22–32)
Calcium: 9.2 mg/dL (ref 8.9–10.3)
Chloride: 103 mmol/L (ref 98–111)
Creatinine, Ser: 1.08 mg/dL — ABNORMAL HIGH (ref 0.44–1.00)
GFR, Estimated: >60 mL/min (ref >60)
Glucose, Bld: 88 mg/dL (ref 70–99)
Potassium: 4 mmol/L (ref 3.5–5.1)
Sodium: 140 mmol/L (ref 135–145)

## 2020-03-14 ENCOUNTER — Other Ambulatory Visit (HOSPITAL_COMMUNITY): Payer: Self-pay | Admitting: Cardiology

## 2020-03-15 ENCOUNTER — Telehealth (HOSPITAL_COMMUNITY): Payer: Self-pay | Admitting: *Deleted

## 2020-03-15 DIAGNOSIS — I350 Nonrheumatic aortic (valve) stenosis: Secondary | ICD-10-CM

## 2020-03-15 NOTE — Telephone Encounter (Signed)
Per Dr Shirlee Latch: needs a cardiac-gated CT to look at her prosthetic aortic valve structure. Please arrange for this, Dr. Jacques Navy to read.      Order placed, will check on pre-cert and arrange

## 2020-03-18 LAB — ECHO TEE
AR max vel: 0.92 cm2
AV Area VTI: 0.9 cm2
AV Area mean vel: 0.89 cm2
AV Mean grad: 32.2 mmHg
AV Peak grad: 49 mmHg
Ao pk vel: 3.5 m/s

## 2020-03-21 ENCOUNTER — Ambulatory Visit (HOSPITAL_COMMUNITY)
Admission: RE | Admit: 2020-03-21 | Discharge: 2020-03-21 | Disposition: A | Discharge status: Home / Self Care | Payer: Medicaid Other | Source: Ambulatory Visit | Attending: Cardiology | Admitting: Cardiology

## 2020-03-21 ENCOUNTER — Other Ambulatory Visit: Payer: Self-pay

## 2020-03-21 ENCOUNTER — Encounter (HOSPITAL_COMMUNITY): Payer: Self-pay

## 2020-03-21 VITALS — BP 110/64 | HR 78 | Wt 205.6 lb

## 2020-03-21 DIAGNOSIS — Z8249 Family history of ischemic heart disease and other diseases of the circulatory system: Secondary | ICD-10-CM | POA: Insufficient documentation

## 2020-03-21 DIAGNOSIS — Z7982 Long term (current) use of aspirin: Secondary | ICD-10-CM | POA: Insufficient documentation

## 2020-03-21 DIAGNOSIS — Z79899 Other long term (current) drug therapy: Secondary | ICD-10-CM | POA: Diagnosis not present

## 2020-03-21 DIAGNOSIS — Z955 Presence of coronary angioplasty implant and graft: Secondary | ICD-10-CM | POA: Insufficient documentation

## 2020-03-21 DIAGNOSIS — Z7902 Long term (current) use of antithrombotics/antiplatelets: Secondary | ICD-10-CM | POA: Insufficient documentation

## 2020-03-21 DIAGNOSIS — I5022 Chronic systolic (congestive) heart failure: Secondary | ICD-10-CM | POA: Diagnosis present

## 2020-03-21 DIAGNOSIS — Z8673 Personal history of transient ischemic attack (TIA), and cerebral infarction without residual deficits: Secondary | ICD-10-CM | POA: Diagnosis not present

## 2020-03-21 DIAGNOSIS — Z86718 Personal history of other venous thrombosis and embolism: Secondary | ICD-10-CM | POA: Diagnosis not present

## 2020-03-21 DIAGNOSIS — Z953 Presence of xenogenic heart valve: Secondary | ICD-10-CM | POA: Insufficient documentation

## 2020-03-21 DIAGNOSIS — Z87891 Personal history of nicotine dependence: Secondary | ICD-10-CM | POA: Diagnosis not present

## 2020-03-21 LAB — BASIC METABOLIC PANEL
Anion gap: 7 (ref 5–15)
BUN: 16 mg/dL (ref 6–20)
CO2: 28 mmol/L (ref 22–32)
Calcium: 8.9 mg/dL (ref 8.9–10.3)
Chloride: 104 mmol/L (ref 98–111)
Creatinine, Ser: 0.94 mg/dL (ref 0.44–1.00)
GFR, Estimated: >60 mL/min (ref >60)
Glucose, Bld: 100 mg/dL — ABNORMAL HIGH (ref 70–99)
Potassium: 4.1 mmol/L (ref 3.5–5.1)
Sodium: 139 mmol/L (ref 135–145)

## 2020-03-21 LAB — DIGOXIN LEVEL: Digoxin Level: 0.7 ng/mL — ABNORMAL LOW (ref 0.8–2.0)

## 2020-03-21 MED ORDER — POTASSIUM CHLORIDE CRYS ER 20 MEQ PO TBCR: 10.0000 meq | EXTENDED_RELEASE_TABLET | Freq: Every day | ORAL | 1 refills | Status: DC

## 2020-03-21 MED ORDER — ENTRESTO 97-103 MG PO TABS: 1.0000 | ORAL_TABLET | Freq: Two times a day (BID) | ORAL | 6 refills | Status: DC

## 2020-03-21 MED ORDER — FUROSEMIDE 40 MG PO TABS: 40.0000 mg | ORAL_TABLET | Freq: Every day | ORAL | 3 refills | Status: DC

## 2020-03-21 NOTE — Progress Notes (Signed)
PCP: Patient, No Pcp Per Cardiology: Dr. Mayford Knife HF Cardiology: Dr. Shirlee Latch  30 y.o. with history of polysubstance abuse (cocaine, heroin, methamphetamines) and smoking, endocarditis, and prolonged hospitalization 3/21-6/21 was referred by Dr. Mayford Knife for evaluation of new onset cardiomyopathy.  Patient was admitted to [facility] in 3/21 with left MCA CVA. She had revascularization of the MCA by IR, procedure complicated by left carotid dissection treated with a stent. TEE showed endocarditis => large tricuspid regurgitation with mild to moderate TR, small MV vegetation with mild MR, and aortic valve vegetation (bicuspid valve) with severe AI.  Blood cultures remained negative, she was treated with empiric abx. She had septic emboli to the brain, kidneys, spleen, and lungs.  On 04/28/19, she had bioprosthetic AVR and debridement + repair of the tricuspid valve. She additionally had a right CFV DVT.  As she was not able to be anticoagulated with septic emboli to the brain as well as the carotid dissection, she had an IVC filter placed.  After a prolonged course, she was discharged in 6/21.  She is now living at home with her mother.  She has not smoked or used drugs since she was admitted in 3/21.    Echo was done in 9/21, this showed no vegetations but EF was down to 20-25% with severely decreased RV systolic function, moderate MR, moderate TR s/p TV repair, bioprosthetic AoV functioning normally.  Of note, intra-op TEE at time of valve surgery showed normal EF. She did not have a post-op echo until 9/21. She has no history of cardiomyopathy or MI in her family.   In 9/21, she had RHC/LHC showing no CAD, markedly elevated filling pressures and low cardiac output.  There was a fistulous connection from the aortic root to the left ventricle.  She was admitted and started on milrinone and diuresed.  TEE was done, showing EF 25%, moderately decreased RV systolic function, bioprosthetic valve with mean gradient 15 mmHg,  serpiginous perivalvular leakage from aortic root to LV, appeared moderate in severity.  Cardiac MRI showed LV EF 18%, RV EF 21%, nonspecific RV insertion site LGE.   Echo repeated 02/19/20 and reviewed, EF 40-45% with mild LV dilation, mild MR, s/p TV repair, s/p bioprosthetic AVR with mean gradient markedly higher than in the past at 47 mmHg with trivial AI.    Last clinic visit w/ Dr. Shirlee Latch was 02/19/20. At visit, her wt was up 14 lb, which she attributed to poor diet. Symptomatically, she had been feeling well. Reported walking 30-45 min a day w/o exertional symptom. Sherryll Burger was increased to 49-51. She was also referred for TEE but better assess her AV. TEE was completed 2/16. On TEE, the valve opened well with no obvious limitation. There was peri-valvular leakage that traced a serpiginous course from the aortic root to the left ventricle, no more than moderate in severity. Mean gradient 33 mmHg across the valve with calculated AVA 1.1 cm^2 and dimensionless index 0.5 by TEE images, after we had to remove probe due to loss of IV and sedation, able to get mean gradient 47 mmHg on TTE images with AVA 0.6 cm^2 and dimensionless index 0.26. The aortic valve has been repaired/replaced. Aortic valve regurgitation is moderate. Aortic valve mean gradient measures 32.2 mmHg. Aortic valve peak gradient measures 49.0 mmHg. Aortic valve area, by VTI measures 0.90 cm. Elevated gradient across aortic valve did not appear to be due to valvular degeneration or partial thrombosis.  Suspect high flow with aortic insufficiency as well as possible  degree of patient-prosthesis mismatch (valve appears relatively small).  Dr. Shirlee LatchMcLean planned to discuss further w/ colleagues.    She returns now for f/u. Wt down 8 lb. BP stable/ well controlled. Tolerating Entresto increase ok. No orthostatic symptoms. Remains NYHA Class I-II. Denies orthopnea/PND. No LEE.     Labs (5/21): creatinine 0.73 Labs (10/21): K 3.5, creatinine  0.95, digoxin level 0.6 Labs (12/07/2019): K 4.2 Creatinine 0.9  Labs (12/21): K 3.7, creatinine 0.94 Labs (2/22): K 4.0, creatinine 1.08, dig <0.2   ECG: Not performed    PMH: 1. Polysubstance abuse: Cocaine, heroin, methamphetamines.  None since 3/21. 2. Prior smoker: Stopped 3/21.  3. CVA: 3/21, septic embolus.  She had MCA revascularization by IR.  - Left ICA dissection as complication of procedure requiring stent placement.  4. Endocarditis: 3/21.  Culture negative.  She had septic emboli to brain, kidney, spleen and lungs.  - TEE (3/21): EF 55-60%, normal RV, small MV vegetation with mild MR, large TV vegetation with mild-moderate TR, bicuspid aortic valve with aortic valve endocarditis and severe AI.  - 04/28/19 she had bioprosthetic AVR and debridement + repair of tricuspid valve.  5. DVT (3/21): IVC filter => not candidate for anticoagulation given septic emboli, dissection of carotid with IR procedure initially.  6. Chronic systolic CHF: Echo in 9/21 showed EF 20-25%, global hypokinesis, severely decreased RV systolic function, moderate MR, moderate TR s/p TV repair, bioprosthetic aortic valve with mean gradient 10 mmHg and no regurgitation; no vegetation noted. Most recent prior echo was the TEE done at the time of surgery in 4/21, EF was normal then.  - LHC/RHC (9/21): No signifcant CAD; mean RA 14, PA 56/26, mean PCWP 28, CI 1.62, PVR 3.7.  Fistulous connection aortic root to LV.  - TEE (9/21): EF 25%, moderately decreased RV systolic function, bioprosthetic aortic valve with mean gradient 15 mmHg, serpiginous perivalvular leakage, moderate in severity.  - Cardiac MRI: Nonspecific RV insertion site LGE. LV EF 18%, RV EF 21%.  - Echo (2/22): EF 40-45% with mild LV dilation, mild MR, s/p TV repair, s/p bioprosthetic AVR with mean gradient markedly higher than in the past at 47 mmHg with trivial AI.   7. 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   Social  History   Socioeconomic History  . Marital status: Single    Spouse name: Not on file  . Number of children: Not on file  . Years of education: Not on file  . Highest education level: Not on file  Occupational History  . Not on file  Tobacco Use  . Smoking status: Former Smoker    Quit date: 03/10/2019    Years since quitting: 1.0  . Smokeless tobacco: Never Used  Vaping Use  . Vaping Use: Unknown  Substance and Sexual Activity  . Alcohol use: Not Currently  . Drug use: Not Currently  . Sexual activity: Not on file  Other Topics Concern  . Not on file  Social History Narrative  . Not on file   Social Determinants of Health   Financial Resource Strain: Not on file  Food Insecurity: Not on file  Transportation Needs: Not on file  Physical Activity: Not on file  Stress: Not on file  Social Connections: Not on file  Intimate Partner Violence: Not on file   Family History  Problem Relation Age of Onset  . Heart murmur Brother   . CAD Neg Hx   . Heart failure Neg Hx  ROS: All systems reviewed and negative except as per HPI.   Current Outpatient Medications  Medication Sig Dispense Refill  . aspirin EC 81 MG EC tablet Take 1 tablet (81 mg total) by mouth daily. 90 tablet 1  . busPIRone (BUSPAR) 7.5 MG tablet Take 1 tablet (7.5 mg total) by mouth 2 (two) times daily. 60 tablet 3  . carvedilol (COREG) 3.125 MG tablet Take 1 tablet (3.125 mg total) by mouth 2 (two) times daily. 60 tablet 3  . clopidogrel (PLAVIX) 75 MG tablet Take 1 tablet (75 mg total) by mouth daily. 90 tablet 11  . digoxin (LANOXIN) 0.125 MG tablet Take 1 tablet (0.125 mg total) by mouth daily. 90 tablet 3  . escitalopram (LEXAPRO) 10 MG tablet Take 1 tablet (10 mg total) by mouth daily. 90 tablet 1  . FARXIGA 10 MG TABS tablet TAKE 1 TABLET BY MOUTH ONCE DAILY BEFORE BREAKFAST 30 tablet 5  . sacubitril-valsartan (ENTRESTO) 97-103 MG Take 1 tablet by mouth 2 (two) times daily. 60 tablet 6  .  spironolactone (ALDACTONE) 25 MG tablet Take 1 tablet (25 mg total) by mouth daily. 90 tablet 3  . furosemide (LASIX) 40 MG tablet Take 1 tablet (40 mg total) by mouth daily. 90 tablet 3  . potassium chloride SA (KLOR-CON) 20 MEQ tablet Take 0.5 tablets (10 mEq total) by mouth daily. 15 tablet 1   No current facility-administered medications for this encounter.   BP 110/64   Pulse 78   Wt 93.3 kg (205 lb 9.6 oz)   SpO2 100%   BMI 32.20 kg/m   Wt Readings from Last 3 Encounters:  03/21/20 93.3 kg (205 lb 9.6 oz)  02/24/20 90.7 kg (200 lb)  02/19/20 94.7 kg (208 lb 12.8 oz)    PHYSICAL EXAM: General:  Well appearing. No respiratory difficulty HEENT: normal Neck: supple. no JVD. Carotids 2+ bilat; no bruits. No lymphadenopathy or thyromegaly appreciated. Cor: PMI nondisplaced. Regular rate & rhythm. No rubs, gallops or murmurs. Lungs: clear Abdomen: soft, nontender, nondistended. No hepatosplenomegaly. No bruits or masses. Good bowel sounds. Extremities: no cyanosis, clubbing, rash, edema Neuro: alert & oriented x 3, cranial nerves grossly intact. moves all 4 extremities w/o difficulty. Affect pleasant.   Assessment/Plan: 1.Chronic systolic CHF: 9/21 echo showed no vegetations but EF was down to 20-25% with severely decreased RV systolic function, moderate MR, moderate TR s/p TV repair, bioprosthetic AoV functioning normally(but there is evidence of peri-valvular leakage). Of note, intra-op TEE at time of valve surgery showed normal EF. She did not have a post-op echo until 9/21. Cause of biventricular failure is uncertain, ?viral myocarditis ("cold" in 8/21), ?volume load from aortic root => LV fistula. RHC in 9/21 showed significantly elevated PCWP and RA pressure, cardiac index was low at 1.6. Cardiac MRI showed nonspecific RV insertion site LGE. Repeat echo 2/22 showed improved LV function to EF 40-45% but now concerning for severe bioprosthetic stenosis (?bioprosthetic valve  thrombosis, see problem #8).  NYHA class II symptoms, she is actually doing well symptomatically. Walks 45 min a day w/o exertional symptoms.  Not volume overloaded on exam.  - Increase Entersto to 97-103 mg bid  - Reduce Lasix to 40 mg bid to once daily (advised to monitor wt daily, can take extra lasix PRN). - Continue Core 3.125 mg bid.  -Continuespironolactone 25 daily.  - Digoxin 0.125 daily, check digoxin level today  - Check BMP today and again in 7 days.  - Discussed importance of avoiding pregnancy  given teratogenic potential of meds. She verbalized understanding.  2. H/o endocarditis: Culture negative. 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 TEE did not appear to show active vegetation.  3. Bicuspid aortic valve disorder: Now s/p bioprosthetic AVR. Thoracic aorta not dilated on 3/21 CT.  4. H/o R CFV DVT: Has IVC filter, think ok to remove at this point.  5. H/o CVA: Had left MCA revascularization by IR, complicated by left ICA dissection and stent placement.  - On ASA 81 + Plavix for stent.  6. Polysubstance abuse: No drugs, ETOH, or smoking since 3/21. 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 Suspect this is a sequelae of prior endocarditis. No fever, blood cultures so far negative.  TEE in 9/21 showed aortic root to LV peri-valvular leakage with a serpiginous course, suspect no more than moderate in severity.  Does not explain extent of her cardiomyopathy.  Discussed with Dr. Excell Seltzer, will follow for now with serial echoes.  8. Bioprosthetic valve stenosis: 2D echo 2/22 was not impressive for significant peri-valvular AI, but showed markedly increased aortic valve mean gradient (up to 47 mmHg).  The valve was not well-visualized. She was also referred for TEE but better assess her AV. TEE was completed 2/16. On TEE, the valve opened well with no obvious  limitation. There was peri-valvular leakage that traced a serpiginous course from the aortic root to the left ventricle, no more than moderate in severity. Mean gradient 33 mmHg across the valve with calculated AVA 1.1 cm^2 and dimensionless index 0.5 by TEE images, after we had to remove probe due to loss of IV and sedation, able to get mean gradient 47 mmHg on TTE images with AVA 0.6 cm^2 and dimensionless index 0.26. The aortic valve has been repaired/replaced. Aortic valve regurgitation is moderate. Aortic valve mean gradient measures 32.2 mmHg. Aortic valve peak gradient measures 49.0 mmHg. Aortic valve area, by VTI measures 0.90 cm. Elevated gradient across aortic valve did not appear to be due to valvular degeneration or partial thrombosis.  Suspect high flow with aortic insufficiency as well as possible degree of patient-prosthesis mismatch (valve appears relatively small).  Dr. Shirlee Latch planned to discuss further w/ colleagues.    F/u in 4 weeks w/ APP  Robbie Lis PA-C  03/21/2020

## 2020-03-21 NOTE — Patient Instructions (Signed)
INCREASE Entresto to 97/103 mg, one tab twice a day DECREASE Lasix to 40 mg, one a day DECREASE Potassium to 10 meq (one half tab) once daily  Labs today We will only contact you if something comes back abnormal or we need to make some changes. Otherwise no news is good news!  Labs needed in 7-10 days  Your physician recommends that you schedule a follow-up appointment in: 4 weeks  in the Advanced Practitioners (PA/NP) Clinic   Do the following things EVERYDAY: 1) Weigh yourself in the morning before breakfast. Write it down and keep it in a log. 2) Take your medicines as prescribed 3) Eat low salt foods-Limit salt (sodium) to 2000 mg per day.  4) Stay as active as you can everyday 5) Limit all fluids for the day to less than 2 liters  At the [facility], you and your health needs are our priority. As part of our continuing mission to provide you with exceptional heart care, we have created designated Provider Care Teams. These Care Teams include your primary Cardiologist (physician) and Advanced Practice Providers (APPs- Physician Assistants and Nurse Practitioners) who all work together to provide you with the care you need, when you need it.   You may see any of the following providers on your designated Care Team at your next follow up: Marland Kitchen Dr Arvilla Meres . Dr Marca Ancona . Dr Thornell Mule . Tonye Becket, NP . Robbie Lis, PA . Shanda Bumps [place],NP . [name], PharmD   Please be sure to bring in all your medications bottles to every appointment.    If you have any questions or concerns before your next appointment please send [place] a message through [place] or call our office at [phone].    TO LEAVE A MESSAGE FOR THE NURSE SELECT OPTION 2, PLEASE LEAVE A MESSAGE INCLUDING: . YOUR NAME . DATE OF BIRTH . CALL BACK NUMBER . REASON FOR CALL**this is important as we prioritize the call backs  YOU WILL RECEIVE A CALL BACK THE SAME DAY AS LONG AS  YOU CALL BEFORE 4:00 PM Please see our updated No Show and Same Day Appointment Cancellation Policy attached to your AVS.

## 2020-03-28 ENCOUNTER — Ambulatory Visit (HOSPITAL_COMMUNITY): Payer: Medicaid Other

## 2020-03-28 ENCOUNTER — Telehealth (HOSPITAL_COMMUNITY): Payer: Self-pay | Admitting: *Deleted

## 2020-03-28 NOTE — Telephone Encounter (Signed)
Attempted to call patient regarding upcoming cardiac CT appointment. °Left message on voicemail with name and callback number ° °[name] RN Navigator Cardiac Imaging °[facility] °[phone] Office °[phone] Cell ° °

## 2020-03-28 NOTE — Telephone Encounter (Signed)
Reaching out to patient to offer assistance regarding upcoming cardiac imaging study; pt verbalizes understanding of appt date/time, parking situation and where to check in, pre-test NPO status and medications ordered, and verified current allergies; name and call back number provided for further questions should they arise  Larey Brick RN Navigator Cardiac Imaging [facility] [phone] office [phone] cell   After discussing with Dr. Shirlee Latch, pt to take a 6.25mg  carvedilol prior to scan.

## 2020-03-29 ENCOUNTER — Other Ambulatory Visit: Payer: Self-pay

## 2020-03-29 ENCOUNTER — Ambulatory Visit (HOSPITAL_COMMUNITY)
Admission: RE | Admit: 2020-03-29 | Discharge: 2020-03-29 | Disposition: A | Discharge status: Home / Self Care | Payer: Medicaid Other | Source: Ambulatory Visit | Attending: Cardiology | Admitting: Cardiology

## 2020-03-29 DIAGNOSIS — I350 Nonrheumatic aortic (valve) stenosis: Secondary | ICD-10-CM | POA: Diagnosis present

## 2020-03-29 MED ORDER — IOHEXOL 350 MG/ML SOLN
80.0000 mL | Freq: Once | INTRAVENOUS | Status: AC | PRN
  Administered 2020-03-29: 80 mL via INTRAVENOUS

## 2020-03-31 ENCOUNTER — Ambulatory Visit (HOSPITAL_COMMUNITY)
Admission: RE | Admit: 2020-03-31 | Discharge: 2020-03-31 | Disposition: A | Discharge status: Home / Self Care | Payer: Medicaid Other | Source: Ambulatory Visit | Attending: Cardiology | Admitting: Cardiology

## 2020-03-31 ENCOUNTER — Other Ambulatory Visit: Payer: Self-pay

## 2020-03-31 DIAGNOSIS — I5022 Chronic systolic (congestive) heart failure: Secondary | ICD-10-CM

## 2020-03-31 LAB — BASIC METABOLIC PANEL
Anion gap: 7 (ref 5–15)
BUN: 23 mg/dL — ABNORMAL HIGH (ref 6–20)
CO2: 30 mmol/L (ref 22–32)
Calcium: 9.2 mg/dL (ref 8.9–10.3)
Chloride: 103 mmol/L (ref 98–111)
Creatinine, Ser: 1.02 mg/dL — ABNORMAL HIGH (ref 0.44–1.00)
GFR, Estimated: >60 mL/min (ref >60)
Glucose, Bld: 90 mg/dL (ref 70–99)
Potassium: 4.4 mmol/L (ref 3.5–5.1)
Sodium: 140 mmol/L (ref 135–145)

## 2020-04-18 ENCOUNTER — Encounter (HOSPITAL_COMMUNITY): Payer: Self-pay

## 2020-04-18 ENCOUNTER — Encounter (HOSPITAL_COMMUNITY): Payer: Self-pay | Admitting: Cardiology

## 2020-04-18 ENCOUNTER — Other Ambulatory Visit: Payer: Self-pay

## 2020-04-18 ENCOUNTER — Ambulatory Visit (HOSPITAL_COMMUNITY)
Admission: RE | Admit: 2020-04-18 | Discharge: 2020-04-18 | Disposition: A | Discharge status: Home / Self Care | Payer: Medicaid Other | Source: Ambulatory Visit | Attending: Cardiology | Admitting: Cardiology

## 2020-04-18 VITALS — BP 104/50 | HR 79 | Wt 211.6 lb

## 2020-04-18 DIAGNOSIS — Y838 Other surgical procedures as the cause of abnormal reaction of the patient, or of later complication, without mention of misadventure at the time of the procedure: Secondary | ICD-10-CM | POA: Insufficient documentation

## 2020-04-18 DIAGNOSIS — I77810 Thoracic aortic ectasia: Secondary | ICD-10-CM | POA: Diagnosis not present

## 2020-04-18 DIAGNOSIS — F1911 Other psychoactive substance abuse, in remission: Secondary | ICD-10-CM | POA: Diagnosis not present

## 2020-04-18 DIAGNOSIS — I429 Cardiomyopathy, unspecified: Secondary | ICD-10-CM | POA: Diagnosis not present

## 2020-04-18 DIAGNOSIS — Z95828 Presence of other vascular implants and grafts: Secondary | ICD-10-CM | POA: Insufficient documentation

## 2020-04-18 DIAGNOSIS — Z87891 Personal history of nicotine dependence: Secondary | ICD-10-CM | POA: Diagnosis not present

## 2020-04-18 DIAGNOSIS — Z79899 Other long term (current) drug therapy: Secondary | ICD-10-CM | POA: Diagnosis not present

## 2020-04-18 DIAGNOSIS — I5022 Chronic systolic (congestive) heart failure: Secondary | ICD-10-CM | POA: Diagnosis not present

## 2020-04-18 DIAGNOSIS — Z7982 Long term (current) use of aspirin: Secondary | ICD-10-CM | POA: Diagnosis not present

## 2020-04-18 DIAGNOSIS — T82857D Stenosis of cardiac prosthetic devices, implants and grafts, subsequent encounter: Secondary | ICD-10-CM | POA: Insufficient documentation

## 2020-04-18 DIAGNOSIS — Z86718 Personal history of other venous thrombosis and embolism: Secondary | ICD-10-CM | POA: Insufficient documentation

## 2020-04-18 DIAGNOSIS — I083 Combined rheumatic disorders of mitral, aortic and tricuspid valves: Secondary | ICD-10-CM | POA: Insufficient documentation

## 2020-04-18 DIAGNOSIS — Z8673 Personal history of transient ischemic attack (TIA), and cerebral infarction without residual deficits: Secondary | ICD-10-CM | POA: Diagnosis not present

## 2020-04-18 DIAGNOSIS — Z953 Presence of xenogenic heart valve: Secondary | ICD-10-CM | POA: Diagnosis not present

## 2020-04-18 DIAGNOSIS — Z7902 Long term (current) use of antithrombotics/antiplatelets: Secondary | ICD-10-CM | POA: Insufficient documentation

## 2020-04-18 DIAGNOSIS — Z7984 Long term (current) use of oral hypoglycemic drugs: Secondary | ICD-10-CM | POA: Insufficient documentation

## 2020-04-18 DIAGNOSIS — I081 Rheumatic disorders of both mitral and tricuspid valves: Secondary | ICD-10-CM | POA: Diagnosis not present

## 2020-04-18 LAB — BASIC METABOLIC PANEL
Anion gap: 6 (ref 5–15)
BUN: 17 mg/dL (ref 6–20)
CO2: 27 mmol/L (ref 22–32)
Calcium: 8.7 mg/dL — ABNORMAL LOW (ref 8.9–10.3)
Chloride: 108 mmol/L (ref 98–111)
Creatinine, Ser: 0.99 mg/dL (ref 0.44–1.00)
GFR, Estimated: >60 mL/min (ref >60)
Glucose, Bld: 97 mg/dL (ref 70–99)
Potassium: 4.4 mmol/L (ref 3.5–5.1)
Sodium: 141 mmol/L (ref 135–145)

## 2020-04-18 LAB — DIGOXIN LEVEL: Digoxin Level: 0.5 ng/mL — ABNORMAL LOW (ref 0.8–2.0)

## 2020-04-18 LAB — BRAIN NATRIURETIC PEPTIDE: B Natriuretic Peptide: 198.9 pg/mL — ABNORMAL HIGH (ref 0.0–100.0)

## 2020-04-18 MED ORDER — FUROSEMIDE 40 MG PO TABS: 40.0000 mg | ORAL_TABLET | Freq: Two times a day (BID) | ORAL | 3 refills | Status: DC

## 2020-04-18 MED ORDER — POTASSIUM CHLORIDE CRYS ER 20 MEQ PO TBCR: 20.0000 meq | EXTENDED_RELEASE_TABLET | Freq: Every day | ORAL | 1 refills | Status: DC

## 2020-04-18 NOTE — Progress Notes (Signed)
ReDS Vest / Clip - 04/18/20 1200      ReDS Vest / Clip   Station Marker C    Ruler Value 29    ReDS Value Range High volume overload    ReDS Actual Value 40    Anatomical Comments sitting

## 2020-04-18 NOTE — Progress Notes (Signed)
PCP: Default, Provider, MD Cardiology: Dr. Mayford Knife HF Cardiology: Dr. Shirlee Latch  Reason for visit: f/u for chronic systolic heart failure   30 y.o. with history of polysubstance abuse (cocaine, heroin, methamphetamines) and smoking, endocarditis, and prolonged hospitalization 3/21-6/21 was referred by Dr. Mayford Knife for evaluation of new onset cardiomyopathy.  Patient was admitted to [facility] in 3/21 with left MCA CVA. She had revascularization of the MCA by IR, procedure complicated by left carotid dissection treated with a stent. TEE showed endocarditis => large tricuspid regurgitation with mild to moderate TR, small MV vegetation with mild MR, and aortic valve vegetation (bicuspid valve) with severe AI.  Blood cultures remained negative, she was treated with empiric abx. She had septic emboli to the brain, kidneys, spleen, and lungs.  On 04/28/19, she had bioprosthetic AVR and debridement + repair of the tricuspid valve. She additionally had a right CFV DVT.  As she was not able to be anticoagulated with septic emboli to the brain as well as the carotid dissection, she had an IVC filter placed.  After a prolonged course, she was discharged in 6/21.  She is now living at home with her mother.  She has not smoked or used drugs since she was admitted in 3/21.    Echo was done in 9/21, this showed no vegetations but EF was down to 20-25% with severely decreased RV systolic function, moderate MR, moderate TR s/p TV repair, bioprosthetic AoV functioning normally.  Of note, intra-op TEE at time of valve surgery showed normal EF. She did not have a post-op echo until 9/21. She has no history of cardiomyopathy or MI in her family.   In 9/21, she had RHC/LHC showing no CAD, markedly elevated filling pressures and low cardiac output.  There was a fistulous connection from the aortic root to the left ventricle.  She was admitted and started on milrinone and diuresed.  TEE was done, showing EF 25%, moderately decreased RV  systolic function, bioprosthetic valve with mean gradient 15 mmHg, serpiginous perivalvular leakage from aortic root to LV, appeared moderate in severity.  Cardiac MRI showed LV EF 18%, RV EF 21%, nonspecific RV insertion site LGE.   Echo repeated 02/19/20 and reviewed, EF 40-45% with mild LV dilation, mild MR, s/p TV repair, s/p bioprosthetic AVR with mean gradient markedly higher than in the past at 47 mmHg with trivial AI.    Had recent clinic visit w/ Dr. Shirlee Latch was 02/19/20. At visit, her wt was up 14 lb, which she attributed to poor diet. Symptomatically, she had been feeling well. Reported walking 30-45 min a day w/o exertional symptom. Sherryll Burger was increased to 49-51. She was also referred for TEE but better assess her AV. TEE was completed 2/16. On TEE, the valve opened well with no obvious limitation. There was peri-valvular leakage that traced a serpiginous course from the aortic root to the left ventricle, no more than moderate in severity. Mean gradient 33 mmHg across the valve with calculated AVA 1.1 cm^2 and dimensionless index 0.5 by TEE images, after we had to remove probe due to loss of IV and sedation, able to get mean gradient 47 mmHg on TTE images with AVA 0.6 cm^2 and dimensionless index 0.26. The aortic valve has been repaired/replaced. Aortic valve regurgitation is moderate. Aortic valve mean gradient measures 32.2 mmHg. Aortic valve peak gradient measures 49.0 mmHg. Aortic valve area, by VTI measures 0.90 cm. Elevated gradient across aortic valve did not appear to be due to valvular degeneration or partial thrombosis.  Suspect high flow with aortic insufficiency as well as possible degree of patient-prosthesis mismatch (valve appears relatively small).  Dr. Shirlee Latch planned to discuss further w/ colleagues.   I saw her back for f/u on 03/21/20. Her wt was down 8 lb. BP was stable/ well controlled. NYHA Class I- II. I increased Entersto to 97-103 mg bid and reduced Lasix down from 40 bid to  40 once daily.   She returns to clinic today for f/u. Here w/ her mother. Reports doing well. After lasix change, her wt is up 6 lb and ReDs clip is elevated at 40% but she denies exertional dyspnea. No orthopnea/PND or LEE. Also denies CP, palpitations, dizziness, syncope/ near syncope. Tolerating Entresto increase ok w/o orthostatic symptoms. BP 104/50.   Her mom in inquiring about clearance for travel to [place]. They had planned to stay for 2 months and asking if ok to travel. I discussed w/ Dr. Shirlee Latch. Given her extensive cardiac issues we advised that she limit overseas travel to 1-2 weeks. They also inquired about malaria prophylaxis and had planned to take Doxycyline. Our PharmD has performed drug interaction check and no contraindication w/ current regimen.   Labs (5/21): creatinine 0.73 Labs (10/21): K 3.5, creatinine 0.95, digoxin level 0.6 Labs (12/07/2019): K 4.2 Creatinine 0.9  Labs (12/21): K 3.7, creatinine 0.94 Labs (2/22): K 4.0, creatinine 1.08, dig <0.2  Labs (3/22): K 4.4, creatinine 1.02, dig 0.7   ECG: Not performed    PMH: 1. Polysubstance abuse: Cocaine, heroin, methamphetamines.  None since 3/21. 2. Prior smoker: Stopped 3/21.  3. CVA: 3/21, septic embolus.  She had MCA revascularization by IR.  - Left ICA dissection as complication of procedure requiring stent placement.  4. Endocarditis: 3/21.  Culture negative.  She had septic emboli to brain, kidney, spleen and lungs.  - TEE (3/21): EF 55-60%, normal RV, small MV vegetation with mild MR, large TV vegetation with mild-moderate TR, bicuspid aortic valve with aortic valve endocarditis and severe AI.  - 04/28/19 she had bioprosthetic AVR and debridement + repair of tricuspid valve.  5. DVT (3/21): IVC filter => not candidate for anticoagulation given septic emboli, dissection of carotid with IR procedure initially.  6. Chronic systolic CHF: Echo in 9/21 showed EF 20-25%, global hypokinesis, severely decreased RV systolic  function, moderate MR, moderate TR s/p TV repair, bioprosthetic aortic valve with mean gradient 10 mmHg and no regurgitation; no vegetation noted. Most recent prior echo was the TEE done at the time of surgery in 4/21, EF was normal then.  - LHC/RHC (9/21): No signifcant CAD; mean RA 14, PA 56/26, mean PCWP 28, CI 1.62, PVR 3.7.  Fistulous connection aortic root to LV.  - TEE (9/21): EF 25%, moderately decreased RV systolic function, bioprosthetic aortic valve with mean gradient 15 mmHg, serpiginous perivalvular leakage, moderate in severity.  - Cardiac MRI: Nonspecific RV insertion site LGE. LV EF 18%, RV EF 21%.  - Echo (2/22): EF 40-45% with mild LV dilation, mild MR, s/p TV repair, s/p bioprosthetic AVR with mean gradient markedly higher than in the past at 47 mmHg with trivial AI.   7. 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   Social History   Socioeconomic History  . Marital status: Single    Spouse name: Not on file  . Number of children: Not on file  . Years of education: Not on file  . Highest education level: Not on file  Occupational History  . Not  on file  Tobacco Use  . Smoking status: Former Smoker    Quit date: 03/10/2019    Years since quitting: 1.1  . Smokeless tobacco: Never Used  Vaping Use  . Vaping Use: Unknown  Substance and Sexual Activity  . Alcohol use: Not Currently  . Drug use: Not Currently  . Sexual activity: Not on file  Other Topics Concern  . Not on file  Social History Narrative  . Not on file   Social Determinants of Health   Financial Resource Strain: Not on file  Food Insecurity: Not on file  Transportation Needs: Not on file  Physical Activity: Not on file  Stress: Not on file  Social Connections: Not on file  Intimate Partner Violence: Not on file   Family History  Problem Relation Age of Onset  . Heart murmur Brother   . CAD Neg Hx   . Heart failure Neg Hx    ROS: All systems reviewed and negative  except as per HPI.   Current Outpatient Medications  Medication Sig Dispense Refill  . aspirin EC 81 MG EC tablet Take 1 tablet (81 mg total) by mouth daily. 90 tablet 1  . busPIRone (BUSPAR) 7.5 MG tablet Take 1 tablet (7.5 mg total) by mouth 2 (two) times daily. 60 tablet 3  . carvedilol (COREG) 3.125 MG tablet Take 1 tablet (3.125 mg total) by mouth 2 (two) times daily. 60 tablet 3  . clopidogrel (PLAVIX) 75 MG tablet Take 1 tablet (75 mg total) by mouth daily. 90 tablet 11  . digoxin (LANOXIN) 0.125 MG tablet Take 1 tablet (0.125 mg total) by mouth daily. 90 tablet 3  . escitalopram (LEXAPRO) 10 MG tablet Take 1 tablet (10 mg total) by mouth daily. 90 tablet 1  . FARXIGA 10 MG TABS tablet TAKE 1 TABLET BY MOUTH ONCE DAILY BEFORE BREAKFAST 30 tablet 5  . furosemide (LASIX) 40 MG tablet Take 1 tablet (40 mg total) by mouth daily. 90 tablet 3  . potassium chloride SA (KLOR-CON) 20 MEQ tablet Take 0.5 tablets (10 mEq total) by mouth daily. 15 tablet 1  . sacubitril-valsartan (ENTRESTO) 97-103 MG Take 1 tablet by mouth 2 (two) times daily. 60 tablet 6  . spironolactone (ALDACTONE) 25 MG tablet Take 1 tablet (25 mg total) by mouth daily. 90 tablet 3  . SODIUM FLUORIDE 5000 SENSITIVE 1.1-5 % GEL SMARTSIG:Sparingly By Mouth Daily     No current facility-administered medications for this encounter.   BP (!) 104/50   Pulse 79   Wt 96 kg (211 lb 9.6 oz)   SpO2 98%   BMI 33.14 kg/m   Wt Readings from Last 3 Encounters:  04/18/20 96 kg (211 lb 9.6 oz)  03/21/20 93.3 kg (205 lb 9.6 oz)  02/24/20 90.7 kg (200 lb)    PHYSICAL EXAM: ReDS Clip 40%  General:  Well appearing young female, moderately obese. No respiratory difficulty HEENT: normal Neck: supple. JVD 8-9 cm. Carotids 2+ bilat; no bruits. No lymphadenopathy or thyromegaly appreciated. Cor: PMI nondisplaced. Regular rate & rhythm. 3/6 AI murmur  Lungs: clear Abdomen: soft, nontender, nondistended. No hepatosplenomegaly. No bruits or  masses. Good bowel sounds. Extremities: no cyanosis, clubbing, rash, edema Neuro: alert & oriented x 3, cranial nerves grossly intact. moves all 4 extremities w/o difficulty. Affect pleasant.   Assessment/Plan: 1.Chronic systolic CHF: 9/21 echo showed no vegetations but EF was down to 20-25% with severely decreased RV systolic function, moderate MR, moderate TR s/p TV repair, bioprosthetic AoV  functioning normally(but there is evidence of peri-valvular leakage). Of note, intra-op TEE at time of valve surgery showed normal EF. She did not have a post-op echo until 9/21. Cause of biventricular failure is uncertain, ?viral myocarditis ("cold" in 8/21), ?volume load from aortic root => LV fistula. RHC in 9/21 showed significantly elevated PCWP and RA pressure, cardiac index was low at 1.6. Cardiac MRI showed nonspecific RV insertion site LGE. Repeat echo 2/22 showed improved LV function to EF 40-45% w/ concerning for severe bioprosthetic stenosis (see problem #8).  NYHA class II symptoms, she is actually doing well symptomatically but mildly fluid overloaded after recent lasix dose decrease - Increase Lasix back to 40 mg bid and KCl back to 20 meq bid - Continue Entersto 97-103 mg bid  - Continue Coreg 3.125 mg bid. BP too soft for titration  -Continuespironolactone 25 daily.  - Digoxin 0.125 daily, check digoxin level today  - Repeat BMP today and again in 7 days  - Discussed importance of avoiding pregnancy given teratogenic potential of meds. She verbalized understanding.  - As noted above in HPI. Pt advised to limit overseas travel to no more than 2 weeks. Ok to use doxycyline for malaria prophylaxis.  2. H/o endocarditis: Culture negative. 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 TEE did not appear to show active vegetation.  3. Bicuspid aortic valve disorder: Now s/p  bioprosthetic AVR. Thoracic aorta not dilated on 3/21 CT.  4. H/o R CFV DVT: Has IVC filter, think ok to remove at this point.  5. H/o CVA: Had left MCA revascularization by IR, complicated by left ICA dissection and stent placement.  - On ASA 81 + Plavix for stent.  6. Polysubstance abuse: No drugs, ETOH, or smoking since 3/21. 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 Suspect this is a sequelae of prior endocarditis. No fever, blood cultures so far negative.  TEE in 9/21 showed aortic root to LV peri-valvular leakage with a serpiginous course, suspect no more than moderate in severity.  Does not explain extent of her cardiomyopathy.  Discussed with Dr. Excell Seltzer, will follow for now with serial echoes.  8. Bioprosthetic valve stenosis: 2D echo 2/22 was not impressive for significant peri-valvular AI, but showed markedly increased aortic valve mean gradient (up to 47 mmHg).  The valve was not well-visualized. She was also referred for TEE but better assess her AV. TEE was completed 2/16. On TEE, the valve opened well with no obvious limitation. There was peri-valvular leakage that traced a serpiginous course from the aortic root to the left ventricle, no more than moderate in severity. Mean gradient 33 mmHg across the valve with calculated AVA 1.1 cm^2 and dimensionless index 0.5 by TEE images, after we had to remove probe due to loss of IV and sedation, able to get mean gradient 47 mmHg on TTE images with AVA 0.6 cm^2 and dimensionless index 0.26. The aortic valve has been repaired/replaced. Aortic valve regurgitation is moderate. Aortic valve mean gradient measures 32.2 mmHg. Aortic valve peak gradient measures 49.0 mmHg. Aortic valve area, by VTI measures 0.90 cm. Elevated gradient across aortic valve did not appear to be due to valvular degeneration or partial thrombosis.  Suspect high flow with aortic insufficiency as well as possible degree of patient-prosthesis mismatch (valve appears relatively  small).  Dr. Shirlee Latch planned to discuss further w/ colleagues.    F/u  BMP in 1 week. F/u w/ Dr. Shirlee LatchMcLean in 6 weeks   Robbie LisBrittainy Shariya Gaster PA-C  04/18/2020

## 2020-04-18 NOTE — Patient Instructions (Signed)
INCREASE Lasix to 40 mg twice a day INCREASE Potassium to 20 meq one tab daily  Labs today We will only contact you if something comes back abnormal or we need to make some changes. Otherwise no news is good news!  Labs needed in 1 week   Your physician recommends that you schedule a follow-up appointment in: June 2022  Do the following things EVERYDAY: 1) Weigh yourself in the morning before breakfast. Write it down and keep it in a log. 2) Take your medicines as prescribed 3) Eat low salt foods--Limit salt (sodium) to 2000 mg per day.  4) Stay as active as you can everyday 5) Limit all fluids for the day to less than 2 liters  At the [facility], you and your health needs are our priority. As part of our continuing mission to provide you with exceptional heart care, we have created designated Provider Care Teams. These Care Teams include your primary Cardiologist (physician) and Advanced Practice Providers (APPs- Physician Assistants and Nurse Practitioners) who all work together to provide you with the care you need, when you need it.   You may see any of the following providers on your designated Care Team at your next follow up: Marland Kitchen Dr Arvilla Meres . Dr Marca Ancona . Dr Thornell Mule . Tonye Becket, NP . Robbie Lis, PA . Shanda Bumps [place],NP . [name], PharmD   Please be sure to bring in all your medications bottles to every appointment.   If you have any questions or concerns before your next appointment please send [place] a message through [place] or call our office at [phone].    TO LEAVE A MESSAGE FOR THE NURSE SELECT OPTION 2, PLEASE LEAVE A MESSAGE INCLUDING: . YOUR NAME . DATE OF BIRTH . CALL BACK NUMBER . REASON FOR CALL**this is important as we prioritize the call backs  YOU WILL RECEIVE A CALL BACK THE SAME DAY AS LONG AS YOU CALL BEFORE 4:00 PM  Please see our updated No Show and Same Day Appointment Cancellation Policy  attached to your AVS.

## 2020-04-26 ENCOUNTER — Ambulatory Visit (HOSPITAL_COMMUNITY)
Admission: RE | Admit: 2020-04-26 | Discharge: 2020-04-26 | Disposition: A | Discharge status: Home / Self Care | Payer: Medicaid Other | Source: Ambulatory Visit | Attending: Cardiology | Admitting: Cardiology

## 2020-04-26 ENCOUNTER — Other Ambulatory Visit (HOSPITAL_COMMUNITY): Payer: Self-pay | Admitting: *Deleted

## 2020-04-26 ENCOUNTER — Other Ambulatory Visit: Payer: Self-pay

## 2020-04-26 DIAGNOSIS — I5022 Chronic systolic (congestive) heart failure: Secondary | ICD-10-CM

## 2020-04-29 ENCOUNTER — Other Ambulatory Visit: Payer: Self-pay

## 2020-04-29 ENCOUNTER — Ambulatory Visit (HOSPITAL_COMMUNITY)
Admission: RE | Admit: 2020-04-29 | Discharge: 2020-04-29 | Disposition: A | Discharge status: Home / Self Care | Payer: Medicaid Other | Source: Ambulatory Visit | Attending: Cardiology | Admitting: Cardiology

## 2020-04-29 DIAGNOSIS — I5022 Chronic systolic (congestive) heart failure: Secondary | ICD-10-CM | POA: Diagnosis present

## 2020-04-29 LAB — BASIC METABOLIC PANEL
Anion gap: 4 — ABNORMAL LOW (ref 5–15)
BUN: 18 mg/dL (ref 6–20)
CO2: 27 mmol/L (ref 22–32)
Calcium: 8.9 mg/dL (ref 8.9–10.3)
Chloride: 107 mmol/L (ref 98–111)
Creatinine, Ser: 1.04 mg/dL — ABNORMAL HIGH (ref 0.44–1.00)
GFR, Estimated: >60 mL/min (ref >60)
Glucose, Bld: 71 mg/dL (ref 70–99)
Potassium: 4.5 mmol/L (ref 3.5–5.1)
Sodium: 138 mmol/L (ref 135–145)

## 2020-05-04 ENCOUNTER — Other Ambulatory Visit (HOSPITAL_COMMUNITY): Payer: Self-pay | Admitting: Cardiology

## 2020-05-09 ENCOUNTER — Other Ambulatory Visit (HOSPITAL_COMMUNITY): Payer: Self-pay | Admitting: *Deleted

## 2020-05-10 ENCOUNTER — Telehealth (HOSPITAL_COMMUNITY): Payer: Self-pay | Admitting: *Deleted

## 2020-05-10 NOTE — Telephone Encounter (Signed)
Pt left vm requesting her last office not a procedure reports. I left vm for pt that I will print them and leave them at the front desk in the morning.

## 2020-05-25 ENCOUNTER — Telehealth (HOSPITAL_COMMUNITY): Payer: Self-pay

## 2020-05-25 NOTE — Telephone Encounter (Signed)
Spoke with pt's mother. They are out of the country in [place] right now. I will call back after July 5th to set her up for IVC filter retrieval. AW

## 2020-05-25 NOTE — Telephone Encounter (Signed)
Called to schedule ivc filter retrieval, no answer, phone just rang with no vm. AW

## 2020-07-14 ENCOUNTER — Encounter (HOSPITAL_COMMUNITY): Payer: Medicaid Other | Admitting: Cardiology

## 2020-08-24 ENCOUNTER — Other Ambulatory Visit (HOSPITAL_COMMUNITY): Payer: Self-pay | Admitting: Cardiology

## 2020-09-29 ENCOUNTER — Other Ambulatory Visit (HOSPITAL_COMMUNITY): Payer: Self-pay | Admitting: Cardiology

## 2020-10-05 ENCOUNTER — Other Ambulatory Visit (HOSPITAL_COMMUNITY): Payer: Self-pay | Admitting: *Deleted

## 2020-10-05 DIAGNOSIS — I5022 Chronic systolic (congestive) heart failure: Secondary | ICD-10-CM

## 2020-10-05 MED ORDER — DAPAGLIFLOZIN PROPANEDIOL 10 MG PO TABS: 10.0000 mg | ORAL_TABLET | Freq: Every day | ORAL | 0 refills | Status: DC

## 2020-10-18 ENCOUNTER — Other Ambulatory Visit: Payer: Self-pay

## 2020-10-18 ENCOUNTER — Ambulatory Visit (HOSPITAL_COMMUNITY)
Admission: RE | Admit: 2020-10-18 | Discharge: 2020-10-18 | Disposition: A | Discharge status: Home / Self Care | Payer: Medicaid Other | Source: Ambulatory Visit | Attending: Cardiology | Admitting: Cardiology

## 2020-10-18 ENCOUNTER — Encounter (HOSPITAL_COMMUNITY): Payer: Self-pay | Admitting: Cardiology

## 2020-10-18 VITALS — BP 100/60 | HR 80 | Wt 220.2 lb

## 2020-10-18 DIAGNOSIS — Z7982 Long term (current) use of aspirin: Secondary | ICD-10-CM | POA: Insufficient documentation

## 2020-10-18 DIAGNOSIS — Z09 Encounter for follow-up examination after completed treatment for conditions other than malignant neoplasm: Secondary | ICD-10-CM | POA: Diagnosis not present

## 2020-10-18 DIAGNOSIS — Z79899 Other long term (current) drug therapy: Secondary | ICD-10-CM | POA: Insufficient documentation

## 2020-10-18 DIAGNOSIS — Z7984 Long term (current) use of oral hypoglycemic drugs: Secondary | ICD-10-CM | POA: Insufficient documentation

## 2020-10-18 DIAGNOSIS — Z8673 Personal history of transient ischemic attack (TIA), and cerebral infarction without residual deficits: Secondary | ICD-10-CM | POA: Insufficient documentation

## 2020-10-18 DIAGNOSIS — Z953 Presence of xenogenic heart valve: Secondary | ICD-10-CM | POA: Insufficient documentation

## 2020-10-18 DIAGNOSIS — Z86718 Personal history of other venous thrombosis and embolism: Secondary | ICD-10-CM | POA: Insufficient documentation

## 2020-10-18 DIAGNOSIS — Z87891 Personal history of nicotine dependence: Secondary | ICD-10-CM | POA: Diagnosis not present

## 2020-10-18 DIAGNOSIS — Q231 Congenital insufficiency of aortic valve: Secondary | ICD-10-CM | POA: Insufficient documentation

## 2020-10-18 DIAGNOSIS — I5022 Chronic systolic (congestive) heart failure: Secondary | ICD-10-CM | POA: Diagnosis not present

## 2020-10-18 DIAGNOSIS — Z7902 Long term (current) use of antithrombotics/antiplatelets: Secondary | ICD-10-CM | POA: Diagnosis not present

## 2020-10-18 DIAGNOSIS — I350 Nonrheumatic aortic (valve) stenosis: Secondary | ICD-10-CM

## 2020-10-18 DIAGNOSIS — Z7901 Long term (current) use of anticoagulants: Secondary | ICD-10-CM | POA: Insufficient documentation

## 2020-10-18 LAB — BASIC METABOLIC PANEL
Anion gap: 9 (ref 5–15)
BUN: 12 mg/dL (ref 6–20)
CO2: 24 mmol/L (ref 22–32)
Calcium: 9.1 mg/dL (ref 8.9–10.3)
Chloride: 106 mmol/L (ref 98–111)
Creatinine, Ser: 0.78 mg/dL (ref 0.44–1.00)
GFR, Estimated: >60 mL/min (ref >60)
Glucose, Bld: 84 mg/dL (ref 70–99)
Potassium: 4.1 mmol/L (ref 3.5–5.1)
Sodium: 139 mmol/L (ref 135–145)

## 2020-10-18 LAB — DIGOXIN LEVEL: Digoxin Level: <0.2 ng/mL — ABNORMAL LOW (ref 0.8–2.0)

## 2020-10-18 MED ORDER — DAPAGLIFLOZIN PROPANEDIOL 10 MG PO TABS: 10.0000 mg | ORAL_TABLET | Freq: Every day | ORAL | 3 refills | Status: DC

## 2020-10-18 MED ORDER — CARVEDILOL 6.25 MG PO TABS: 6.2500 mg | ORAL_TABLET | Freq: Two times a day (BID) | ORAL | 3 refills | Status: DC

## 2020-10-18 NOTE — Patient Instructions (Addendum)
Labs done today. We will contact you only if your labs are abnormal.  RESTART Farxiga 10mg  (1 tablet) by mouth daily.  INCREASE Carvedilol to 6.25mg  (1 tablet) by mouth 2 times daily.    No other medication changes were made. Please continue all current medications as prescribed.  Your physician recommends that you schedule a follow-up appointment soon for an echo, 3 months for a lab only appointment(a paper prescription was provided to you during your appointment today. In 6 months with Dr. . Please contact our office in February 2023 to schedule a Lauren Reyes 2023 appointment.   If you have any questions or concerns before your next appointment please send May 2023 a message through [place] or call our office at [phone].    TO LEAVE A MESSAGE FOR THE NURSE SELECT OPTION 2, PLEASE LEAVE A MESSAGE INCLUDING: YOUR NAME DATE OF BIRTH CALL BACK NUMBER REASON FOR CALL**this is important as we prioritize the call backs  YOU WILL RECEIVE A CALL BACK THE SAME DAY AS LONG AS YOU CALL BEFORE 4:00 PM   Do the following things EVERYDAY: Weigh yourself in the morning before breakfast. Write it down and keep it in a log. Take your medicines as prescribed Eat low salt foods--Limit salt (sodium) to 2000 mg per day.  Stay as active as you can everyday Limit all fluids for the day to less than 2 liters   At the [facility], you and your health needs are our priority. As part of our continuing mission to provide you with exceptional heart care, we have created designated Provider Care Teams. These Care Teams include your primary Cardiologist (physician) and Advanced Practice Providers (APPs- Physician Assistants and Nurse Practitioners) who all work together to provide you with the care you need, when you need it.   You may see any of the following providers on your designated Care Team at your next follow up: Dr [phone] Dr Arvilla Meres, NP Carron Curie,  Robbie Lis [place], PharmD   Please be sure to bring in all your medications bottles to every appointment. Lauren Reyes

## 2020-10-18 NOTE — Progress Notes (Signed)
PCP: Default, Provider, MD Cardiology: Dr. Mayford Knife HF Cardiology: Dr. Shirlee Latch  30 y.o. with history of polysubstance abuse (cocaine, heroin, methamphetamines) and smoking, endocarditis, and prolonged hospitalization 3/21-6/21 was referred by Dr. Mayford Knife for evaluation of new onset cardiomyopathy.  Patient was admitted to [facility] in 3/21 with left MCA CVA. She had revascularization of the MCA by IR, procedure complicated by left carotid dissection treated with a stent. TEE showed endocarditis => large tricuspid regurgitation with mild to moderate TR, small MV vegetation with mild MR, and aortic valve vegetation (bicuspid valve) with severe AI.  Blood cultures remained negative, she was treated with empiric abx. She had septic emboli to the brain, kidneys, spleen, and lungs.  On 04/28/19, she had bioprosthetic AVR and debridement + repair of the tricuspid valve. She additionally had a right CFV DVT.  As she was not able to be anticoagulated with septic emboli to the brain as well as the carotid dissection, she had an IVC filter placed.  After a prolonged course, she was discharged in 6/21.  She is now living at home with her mother.  She has not smoked or used drugs since she was admitted in 3/21.    Echo was done in 9/21, this showed no vegetations but EF was down to 20-25% with severely decreased RV systolic function, moderate MR, moderate TR s/p TV repair, bioprosthetic AoV functioning normally.  Of note, intra-op TEE at time of valve surgery showed normal EF. She did not have a post-op echo until 9/21. She has no history of cardiomyopathy or MI in her family.   In 9/21, she had RHC/LHC showing no CAD, markedly elevated filling pressures and low cardiac output.  There was a fistulous connection from the aortic root to the left ventricle.  She was admitted and started on milrinone and diuresed.  TEE was done, showing EF 25%, moderately decreased RV systolic function, bioprosthetic valve with mean gradient 15 mmHg,  serpiginous perivalvular leakage from aortic root to LV, appeared moderate in severity.  Cardiac MRI showed LV EF 18%, RV EF 21%, nonspecific RV insertion site LGE.   Echo in 2/22 with EF 40-45% with mild LV dilation, mild MR, s/p TV repair, s/p bioprosthetic AVR with mean gradient markedly higher than in the past at 47 mmHg with trivial AI.  TEE was then done in 2/22, showing EF 45%, mild LV enlargement, normla RV, s/p TV repair with mild TR, bioprosthetic aortic valve with moderate serpiginous peri-valvular leakage from aortic root to LV, mean aortic valve gradient 47 mmHg with DI 0.26 and AVA 0.6 cm^2.   Today she returns for HF follow up.  She has been doing well symptomatically.  Weight is up, but she recently returned from a trip to visit family in [place] and reports poor dietary control.  No exertional dyspnea or chest pain.  No lightheadedness.  No orthopnea/PND.  No lightheadedness.  She has run out of [place] for about 2 wks.     Labs (5/21): creatinine 0.73 Labs (10/21): K 3.5, creatinine 0.95, digoxin level 0.6 Labs (12/07/2019): K 4.2 Creatinine 0.9  Labs (12/21): K 3.7, creatinine 0.94  PMH: 1. Polysubstance abuse: Cocaine, heroin, methamphetamines.  None since 3/21. 2. Prior smoker: Stopped 3/21.  3. CVA: 3/21, septic embolus.  She had MCA revascularization by IR.  - Left ICA dissection as complication of procedure requiring stent placement.  4. Endocarditis: 3/21.  Culture negative.  She had septic emboli to brain, kidney, spleen and lungs.  - TEE (3/21): EF  55-60%, normal RV, small MV vegetation with mild MR, large TV vegetation with mild-moderate TR, bicuspid aortic valve with aortic valve endocarditis and severe AI.  - 04/28/19 she had bioprosthetic AVR and debridement + repair of tricuspid valve.  5. DVT (3/21): IVC filter => not candidate for anticoagulation given septic emboli, dissection of carotid with IR procedure initially.  6. Chronic systolic CHF: Echo in 9/21 showed EF  20-25%, global hypokinesis, severely decreased RV systolic function, moderate MR, moderate TR s/p TV repair, bioprosthetic aortic valve with mean gradient 10 mmHg and no regurgitation; no vegetation noted. Most recent prior echo was the TEE done at the time of surgery in 4/21, EF was normal then.  - LHC/RHC (9/21): No signifcant CAD; mean RA 14, PA 56/26, mean PCWP 28, CI 1.62, PVR 3.7.  Fistulous connection aortic root to LV.  - TEE (9/21): EF 25%, moderately decreased RV systolic function, bioprosthetic aortic valve with mean gradient 15 mmHg, serpiginous perivalvular leakage, moderate in severity.  - Cardiac MRI: Nonspecific RV insertion site LGE. LV EF 18%, RV EF 21%.  - Echo (2/22): EF 40-45% with mild LV dilation, mild MR, s/p TV repair, s/p bioprosthetic AVR with mean gradient markedly higher than in the past at 47 mmHg with trivial AI.   - TEE (2/22): EF 45%, mild LV enlargement, normla RV, s/p TV repair with mild TR, bioprosthetic aortic valve with moderate serpiginous peri-valvular leakage from aortic root to LV, mean aortic valve gradient 47 mmHg with DI 0.26 and AVA 0.6 cm^2.  7. Fistulous connection aortic root to LV, moderate leakage on 9/21 TEE and 2/22 TEE.   Social History   Socioeconomic History   Marital status: Single    Spouse name: Not on file   Number of children: Not on file   Years of education: Not on file   Highest education level: Not on file  Occupational History   Not on file  Tobacco Use   Smoking status: Former    Types: Cigarettes    Quit date: 03/10/2019    Years since quitting: 1.6   Smokeless tobacco: Never  Vaping Use   Vaping Use: Unknown  Substance and Sexual Activity   Alcohol use: Not Currently   Drug use: Not Currently   Sexual activity: Not on file  Other Topics Concern   Not on file  Social History Narrative   Not on file   Social Determinants of Health   Financial Resource Strain: Not on file  Food Insecurity: Not on file   Transportation Needs: Not on file  Physical Activity: Not on file  Stress: Not on file  Social Connections: Not on file  Intimate Partner Violence: Not on file   Family History  Problem Relation Age of Onset   Heart murmur Brother    CAD Neg Hx    Heart failure Neg Hx    ROS: All systems reviewed and negative except as per HPI.   Current Outpatient Medications  Medication Sig Dispense Refill   aspirin EC 81 MG EC tablet Take 1 tablet (81 mg total) by mouth daily. 90 tablet 1   busPIRone (BUSPAR) 7.5 MG tablet Take 1 tablet by mouth twice daily 60 tablet 0   clopidogrel (PLAVIX) 75 MG tablet Take 1 tablet (75 mg total) by mouth daily. 90 tablet 11   digoxin (LANOXIN) 0.125 MG tablet Take 1 tablet (0.125 mg total) by mouth daily. 90 tablet 3   escitalopram (LEXAPRO) 10 MG tablet Take 1 tablet (10  mg total) by mouth daily. 90 tablet 1   furosemide (LASIX) 40 MG tablet Take 1 tablet (40 mg total) by mouth 2 (two) times daily. 180 tablet 3   potassium chloride SA (KLOR-CON) 20 MEQ tablet Take 1 tablet (20 mEq total) by mouth daily. 30 tablet 1   sacubitril-valsartan (ENTRESTO) 97-103 MG Take 1 tablet by mouth 2 (two) times daily. 60 tablet 6   SODIUM FLUORIDE 5000 SENSITIVE 1.1-5 % GEL SMARTSIG:Sparingly By Mouth Daily     spironolactone (ALDACTONE) 25 MG tablet Take 1 tablet (25 mg total) by mouth daily. 90 tablet 3   carvedilol (COREG) 6.25 MG tablet Take 1 tablet (6.25 mg total) by mouth 2 (two) times daily. 180 tablet 3   dapagliflozin propanediol (FARXIGA) 10 MG TABS tablet Take 1 tablet (10 mg total) by mouth daily before breakfast. 90 tablet 3   No current facility-administered medications for this encounter.   BP 100/60   Pulse 80   Wt 99.9 kg (220 lb 3.2 oz)   SpO2 100%   BMI 34.49 kg/m   Wt Readings from Last 3 Encounters:  10/18/20 99.9 kg (220 lb 3.2 oz)  04/18/20 96 kg (211 lb 9.6 oz)  03/21/20 93.3 kg (205 lb 9.6 oz)   General: NAD Neck: No JVD, no thyromegaly or  thyroid nodule.  Lungs: Clear to auscultation bilaterally with normal respiratory effort. CV: Nondisplaced PMI.  Heart regular S1/S2, no S3/S4, 2/6 early SEM RUSB.  No peripheral edema.  No carotid bruit.  Normal pedal pulses.  Abdomen: Soft, nontender, no hepatosplenomegaly, no distention.  Skin: Intact without lesions or rashes.  Neurologic: Alert and oriented x 3.  Psych: Normal affect. Extremities: No clubbing or cyanosis.  HEENT: Normal.   Assessment/Plan: 1. Chronic systolic CHF: 9/21 echo showed no vegetations but EF was down to 20-25% with severely decreased RV systolic function, moderate MR, moderate TR s/p TV repair, bioprosthetic AoV functioning normally (but there is evidence of peri-valvular leakage).  Of note, intra-op TEE at time of valve surgery showed normal EF. She did not have a post-op echo until 9/21.  Cause of biventricular failure is uncertain, ?viral myocarditis ("cold" in 8/21), ?volume load from aortic root => LV fistula. RHC in 9/21 showed significantly elevated PCWP and RA pressure, cardiac index was low at 1.6.  Cardiac MRI showed nonspecific RV insertion site LGE. Echo in 2/22 showed improved LV function to EF 40-45% but concerning for severe bioprosthetic stenosis (?bioprosthetic valve thrombosis).  TEE in 2/22 showed EF 45%, mild LV enlargement, normla RV, s/p TV repair with mild TR, bioprosthetic aortic valve with moderate serpiginous peri-valvular leakage from aortic root to LV, mean aortic valve gradient 47 mmHg with DI 0.26 and AVA 0.6 cm^2. NYHA class II symptoms, not volume overloaded on exam.  - Discussed importance of pregnancy prevention with current meds.   - Continue Lasix 40 mg bid.  - Increase Coreg to 6.25 mg bid.  - Continue Entresto 97/103 bid, BMET today.   - Continue spironolactone 25 daily.  - Digoxin 0.125 daily, check digoxin level.  - Restart dapagliflozin 10 mg daily.  - She is currently out of ICD range.  - I will arrange for repeat echo  before she returns to [place].  2. H/o endocarditis: Culture negative.  S/p bioprosthetic aortic valve replacement and TV repair. 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 TEE did not appear to show active vegetation.  3. Bicuspid aortic valve disorder: Now s/p bioprosthetic AVR.  Thoracic aorta not dilated on 3/21 CT.  4. H/o R CFV DVT: Has IVC filter, think ok to remove at this point.  5. H/o CVA: Had left MCA revascularization by IR, complicated by left ICA dissection and stent placement.  - On ASA 81 + Plavix for stent.  6. Polysubstance abuse: No drugs, ETOH, or smoking since 3/21.  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  Suspect this is a sequelae of prior endocarditis.  No fever, blood cultures so far negative.  TEE in 9/21 and again in 2/22 showed aortic root to LV peri-valvular leakage with a serpiginous course, suspect no more than moderate in severity.  Does not explain extent of her cardiomyopathy.  Discussed with Dr. Excell Seltzer, will follow for now with serial echoes.  8. Bioprosthetic valve stenosis:  TEE in 2/22 showed EF 45%, mild LV enlargement, normla RV, s/p TV repair with mild TR, bioprosthetic aortic valve with moderate serpiginous peri-valvular leakage from aortic root to LV, mean aortic valve gradient 47 mmHg with DI 0.26 and AVA 0.6 cm^2.  The aortic valve did not show significant degeneration and there was no partial thrombosis noted. Possibly, the elevated gradient is due to small valve with a degree of patient-prosthesis mismatch as well as high flow from moderate aortic insufficiency.  - No plan for intervention at this point but will need to follow valve closely.   Followup in 6 months (she is going to be spending several months in [place], she has a cardiologist in [place]).    Marca Ancona  10/18/2020

## 2020-10-19 ENCOUNTER — Other Ambulatory Visit (HOSPITAL_COMMUNITY): Payer: Self-pay

## 2020-10-19 ENCOUNTER — Other Ambulatory Visit (HOSPITAL_COMMUNITY): Payer: Medicaid Other

## 2020-10-21 ENCOUNTER — Other Ambulatory Visit: Payer: Self-pay

## 2020-10-21 ENCOUNTER — Ambulatory Visit (HOSPITAL_COMMUNITY)
Admission: RE | Admit: 2020-10-21 | Discharge: 2020-10-21 | Disposition: A | Discharge status: Home / Self Care | Payer: Medicaid Other | Source: Ambulatory Visit | Attending: Cardiology | Admitting: Cardiology

## 2020-10-21 DIAGNOSIS — T82858A Stenosis of vascular prosthetic devices, implants and grafts, initial encounter: Secondary | ICD-10-CM | POA: Insufficient documentation

## 2020-10-21 DIAGNOSIS — I5022 Chronic systolic (congestive) heart failure: Secondary | ICD-10-CM | POA: Diagnosis present

## 2020-10-21 DIAGNOSIS — I429 Cardiomyopathy, unspecified: Secondary | ICD-10-CM | POA: Diagnosis not present

## 2020-10-21 DIAGNOSIS — Y838 Other surgical procedures as the cause of abnormal reaction of the patient, or of later complication, without mention of misadventure at the time of the procedure: Secondary | ICD-10-CM | POA: Diagnosis not present

## 2020-10-21 DIAGNOSIS — Z952 Presence of prosthetic heart valve: Secondary | ICD-10-CM | POA: Insufficient documentation

## 2020-10-21 DIAGNOSIS — F1910 Other psychoactive substance abuse, uncomplicated: Secondary | ICD-10-CM | POA: Insufficient documentation

## 2020-10-21 DIAGNOSIS — I082 Rheumatic disorders of both aortic and tricuspid valves: Secondary | ICD-10-CM | POA: Insufficient documentation

## 2020-10-21 DIAGNOSIS — Z8673 Personal history of transient ischemic attack (TIA), and cerebral infarction without residual deficits: Secondary | ICD-10-CM | POA: Diagnosis not present

## 2020-10-21 LAB — ECHOCARDIOGRAM COMPLETE
AR max vel: 1.16 cm2
AV Area VTI: 1.05 cm2
AV Area mean vel: 1.08 cm2
AV Mean grad: 39.4 mmHg
AV Peak grad: 66.7 mmHg
Ao pk vel: 4.08 m/s
Area-P 1/2: 3.77 cm2
Calc EF: 63.4 %
S' Lateral: 2.9 cm
Single Plane A2C EF: 59.8 %
Single Plane A4C EF: 63.7 %

## 2020-10-21 NOTE — Progress Notes (Signed)
  Echocardiogram 2D Echocardiogram has been performed.  Janalyn Harder 10/21/2020, 11:24 AM

## 2020-10-26 ENCOUNTER — Other Ambulatory Visit (HOSPITAL_COMMUNITY): Payer: Self-pay

## 2020-10-26 ENCOUNTER — Telehealth (HOSPITAL_COMMUNITY): Payer: Self-pay | Admitting: Pharmacist

## 2020-10-26 NOTE — Telephone Encounter (Signed)
Patient Advocate Encounter   Received notification from [facility] Medicaid that prior authorization for Sherryll Burger is required.   PA submitted on [place] Tracks Confirmation #: [id number] W Recipient ID: [id number] R Status is pending    Received notification from [facility] Medicaid that prior authorization for Marcelline Deist is required.   PA submitted on [facility] Tracks Confirmation #: [id number] W Recipient ID: [id number] R Status is pending   [name], PharmD, BCPS, BCCP, CPP Heart Failure Clinic Pharmacist [phone]

## 2020-10-27 ENCOUNTER — Other Ambulatory Visit (HOSPITAL_COMMUNITY): Payer: Self-pay | Admitting: Cardiology

## 2020-10-27 ENCOUNTER — Telehealth (HOSPITAL_COMMUNITY): Payer: Self-pay | Admitting: Pharmacy Technician

## 2020-10-27 MED ORDER — POTASSIUM CHLORIDE CRYS ER 20 MEQ PO TBCR: 20.0000 meq | EXTENDED_RELEASE_TABLET | Freq: Every day | ORAL | 3 refills | Status: DC

## 2020-10-27 MED ORDER — ENTRESTO 97-103 MG PO TABS: 1.0000 | ORAL_TABLET | Freq: Two times a day (BID) | ORAL | 3 refills | Status: DC

## 2020-10-27 NOTE — Telephone Encounter (Signed)
Advanced Heart Failure Patient Advocate Encounter  Prior Authorization for Sherryll Burger has been approved.    PA# [id number] Effective dates: 10/26/2020 - 10/26/2021  Prior Authorization for Marcelline Deist has been approved.    PA# [id number] Effective dates: 10/26/2020 - 10/26/2021   [name], PharmD, BCPS, BCCP, CPP Heart Failure Clinic Pharmacist [phone]

## 2020-10-27 NOTE — Telephone Encounter (Signed)
Advanced Heart Failure Patient Advocate Encounter  The patient called and left a request for help obtaining medication, she is going out of the country. Called and spoke with the patient. She told me that it would be about 6 months. I instructed her to call her pharmacy and request they call her insurance and request a vacation override. She is aware that they will likely cap this request at 90 days, not the full 6 months.   I advised her to call back after she speaks with the pharmacy and we can help with some samples of Farxiga. Unfortunately we would not be able to supply the [facility].   Sent 90 day RX request for Potassium and Entresto to [place] ([facility]) to send to [place].

## 2021-04-14 ENCOUNTER — Other Ambulatory Visit (HOSPITAL_COMMUNITY): Payer: Self-pay | Admitting: Cardiology

## 2021-04-17 ENCOUNTER — Telehealth (HOSPITAL_COMMUNITY): Payer: Self-pay | Admitting: Pharmacy Technician

## 2021-04-17 NOTE — Telephone Encounter (Signed)
Patient Advocate Encounter ?  ?Received notification from [facility] [employer] that prior authorization for Lauren Reyes is required. ?  ?PA submitted on CoverMyMeds ?Key [id number] ?Status is pending ?  ?Will continue to follow. ? ?

## 2021-04-18 ENCOUNTER — Other Ambulatory Visit (HOSPITAL_COMMUNITY): Payer: Self-pay

## 2021-04-18 NOTE — Telephone Encounter (Signed)
Advanced Heart Failure Patient Advocate Encounter ? ?Prior Authorization for Delene Loll has been approved.   ? ?Effective dates: 04/17/21 through 04/17/22 ? ?Charlann Boxer, CPhT ? ? ? ?

## 2021-06-14 ENCOUNTER — Other Ambulatory Visit (HOSPITAL_COMMUNITY): Payer: Self-pay | Admitting: Cardiology

## 2021-07-29 IMAGING — CT CT HEAD W/O CM
3 series · 15 of 47 positions shown, 18 images · non-contrast
Comparison: 04/01/2018, correlation made with MRI brain 04/02/2018

CLINICAL DATA: Stroke, follow-up

EXAM:
CT HEAD WITHOUT CONTRAST
TECHNIQUE: Contiguous axial images were obtained from the base of the skull
through the vertex without intravenous contrast.

[Series 3: head 5.0 h30s · axial · 0.46mm/px · z∈[-147,-22]mm · 9 of 30 slices shown, 12 images]
[im 3/30  brain]
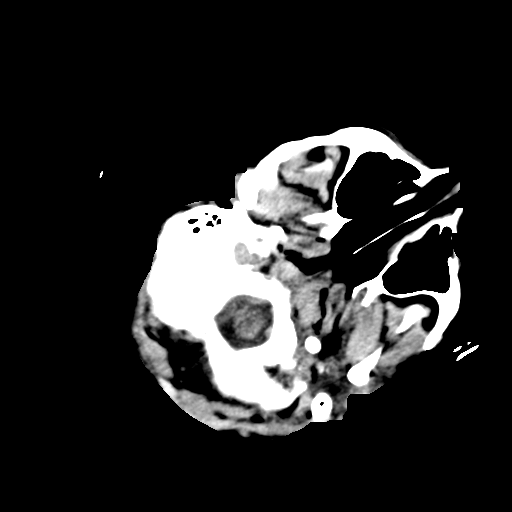
[im 3/30  bone]
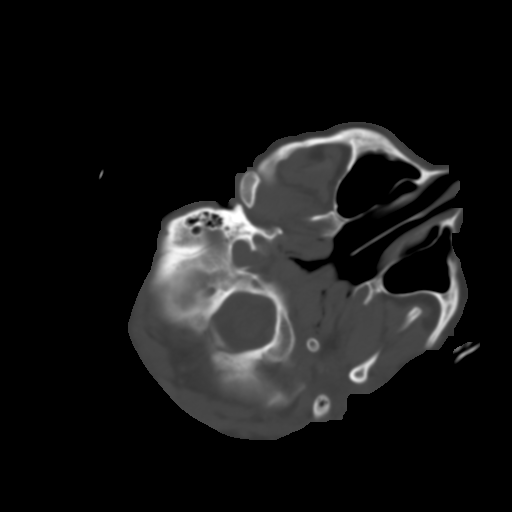
[im 6/30  brain]
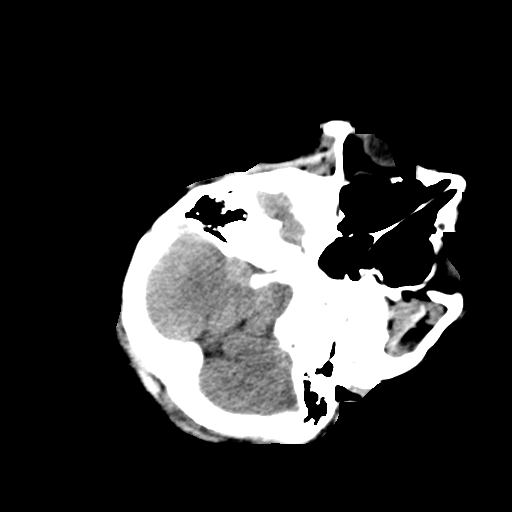
[im 9/30  brain]
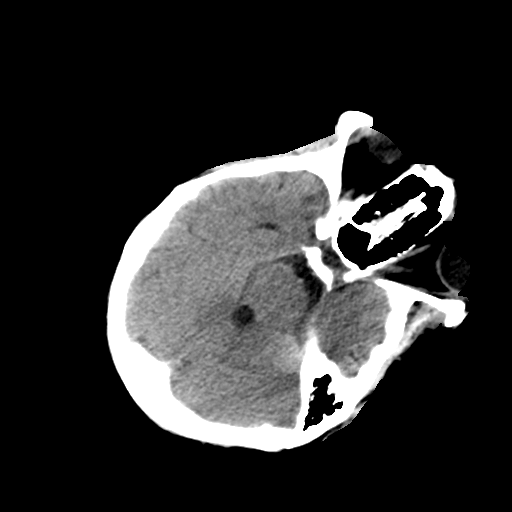
[im 12/30  brain]
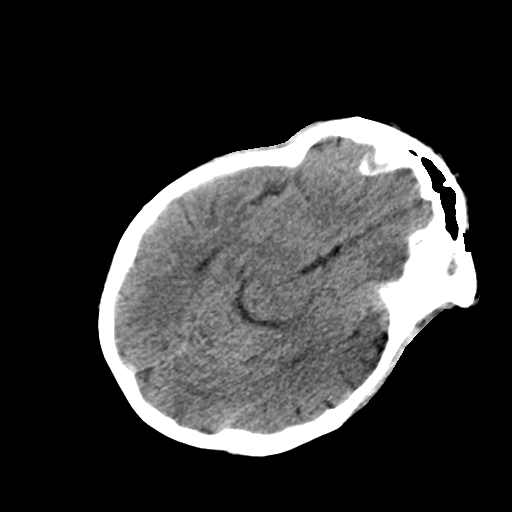
[im 16/30  brain]
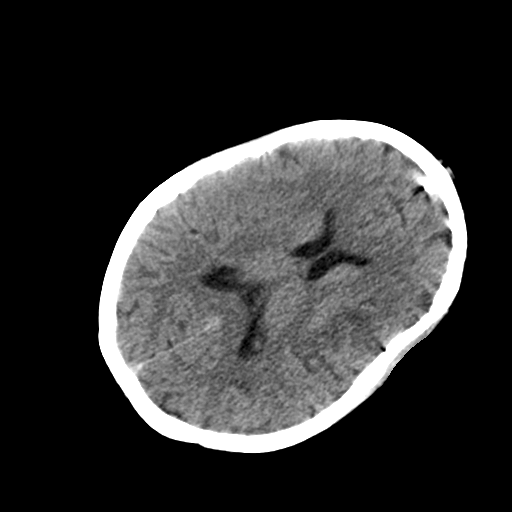
[im 16/30  bone]
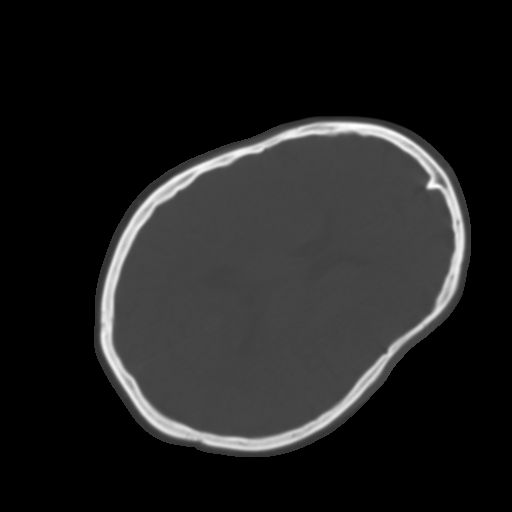
[im 19/30  brain]
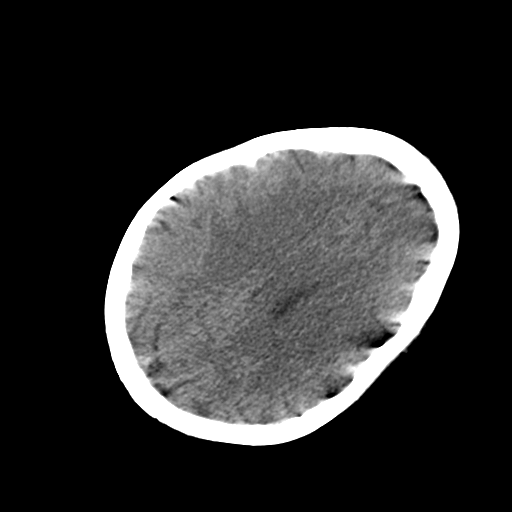
[im 22/30  brain]
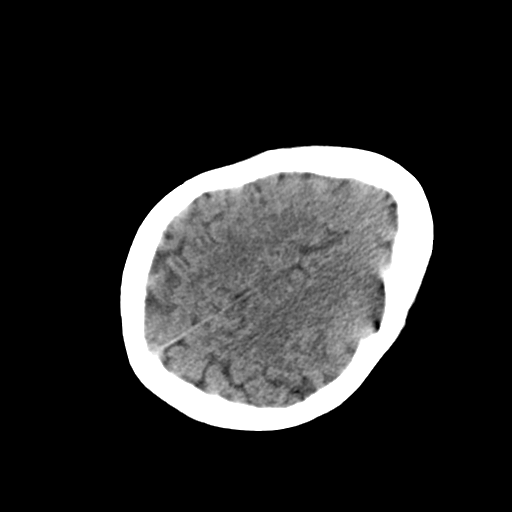
[im 25/30  brain]
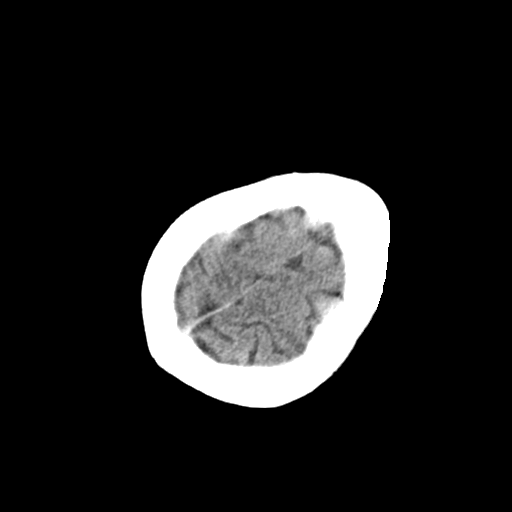
[im 28/30  brain]
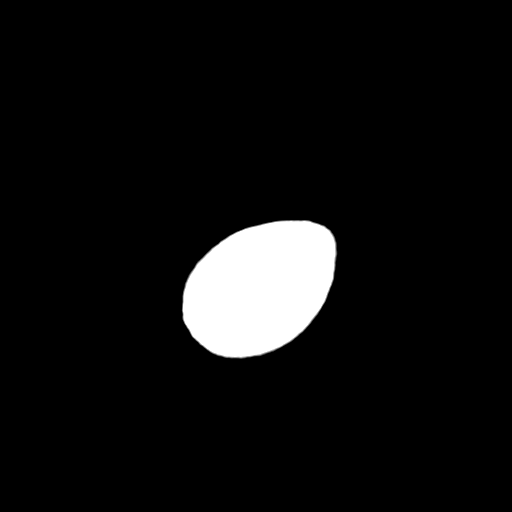
[im 28/30  bone]
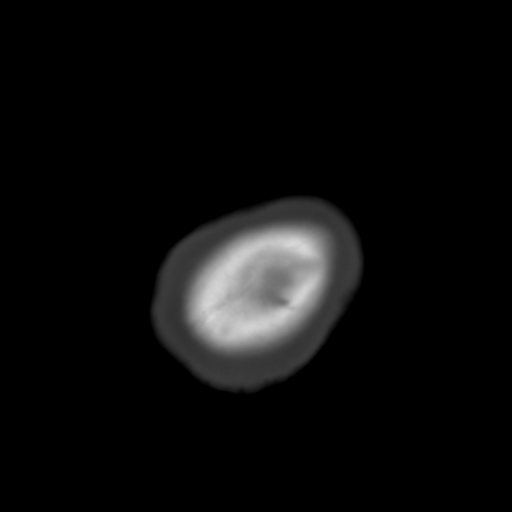

[Series 5: head 3.0 mpr sag · sagittal · 0.33mm/px · 3 of 67 slices shown]
[im 23/67  brain]
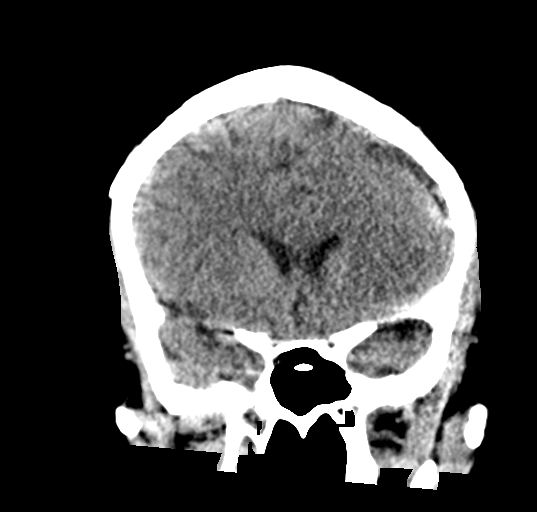
[im 34/67  brain]
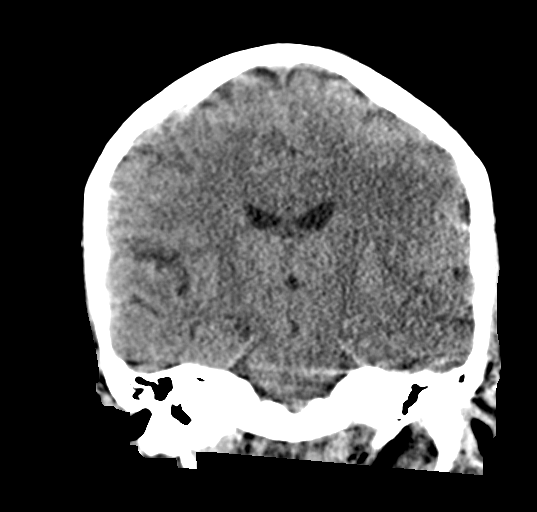
[im 45/67  brain]
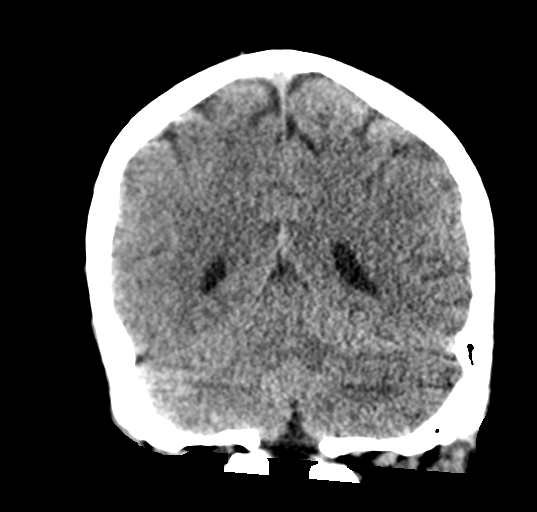

[Series 6: head 3.0 mpr cor · coronal · 0.29mm/px · 3 of 67 slices shown]
[im 23/67  brain]
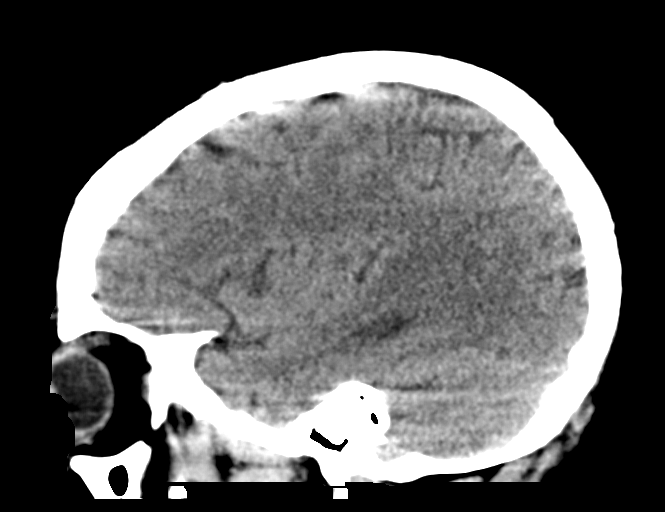
[im 30/67  brain]
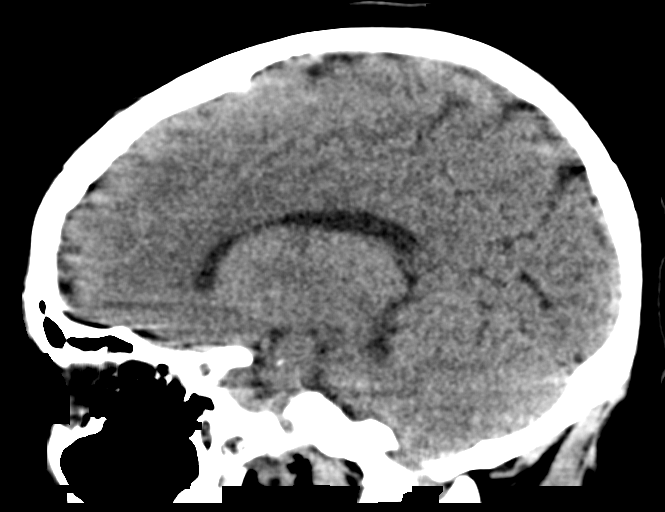
[im 37/67  brain]
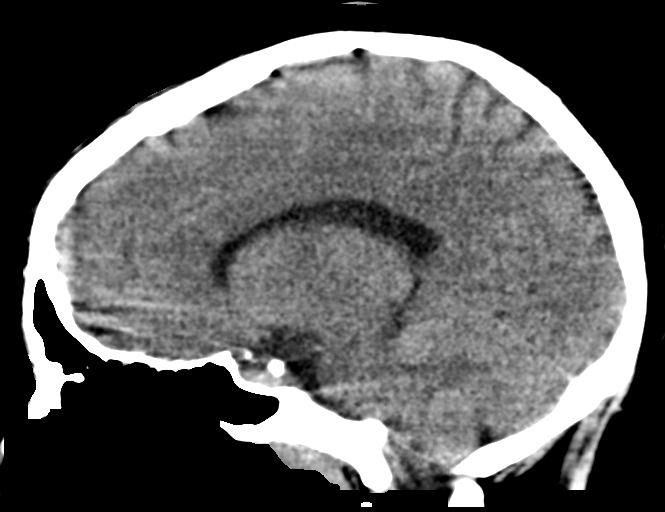

[15 of 47 positions shown; findings below may reference images not displayed]

FINDINGS: Brain: Artifact is present on several slices. There is
hypoattenuation with loss of gray-white differentiation along the
left insula corresponding to recent infarction. Few additional
ill-defined foci are present bilaterally likely corresponding to
many small infarcts seen on MRI. Hypoattenuation within lateral left
cerebellum is likely artifactual. Petechial hemorrhage on MRI is not
visible. There is no discrete hematoma.

Ventricles are stable in size.

Vascular: No hyperdense vessel or unexpected calcification. A stent
is seen within the distal cervical left ICA extending to the
proximal petrous portion.

Skull: Calvarium is unremarkable.

Sinuses/Orbits: No acute finding.

Other: None.
IMPRESSION: Evolving areas of recent infarction better seen on prior MRI.
Petechial hemorrhage is not visible. There is no discrete hematoma.

## 2021-11-29 ENCOUNTER — Other Ambulatory Visit: Payer: Self-pay | Admitting: *Deleted

## 2021-11-29 ENCOUNTER — Other Ambulatory Visit (HOSPITAL_COMMUNITY): Payer: Self-pay | Admitting: Cardiology

## 2022-01-25 IMAGING — CR DG CHEST 2V
2 series · 2 of 2 positions shown · non-contrast
Comparison: 07/08/2019

CLINICAL DATA: Congestive heart failure

EXAM:
CHEST - 2 VIEW

[chest lat]
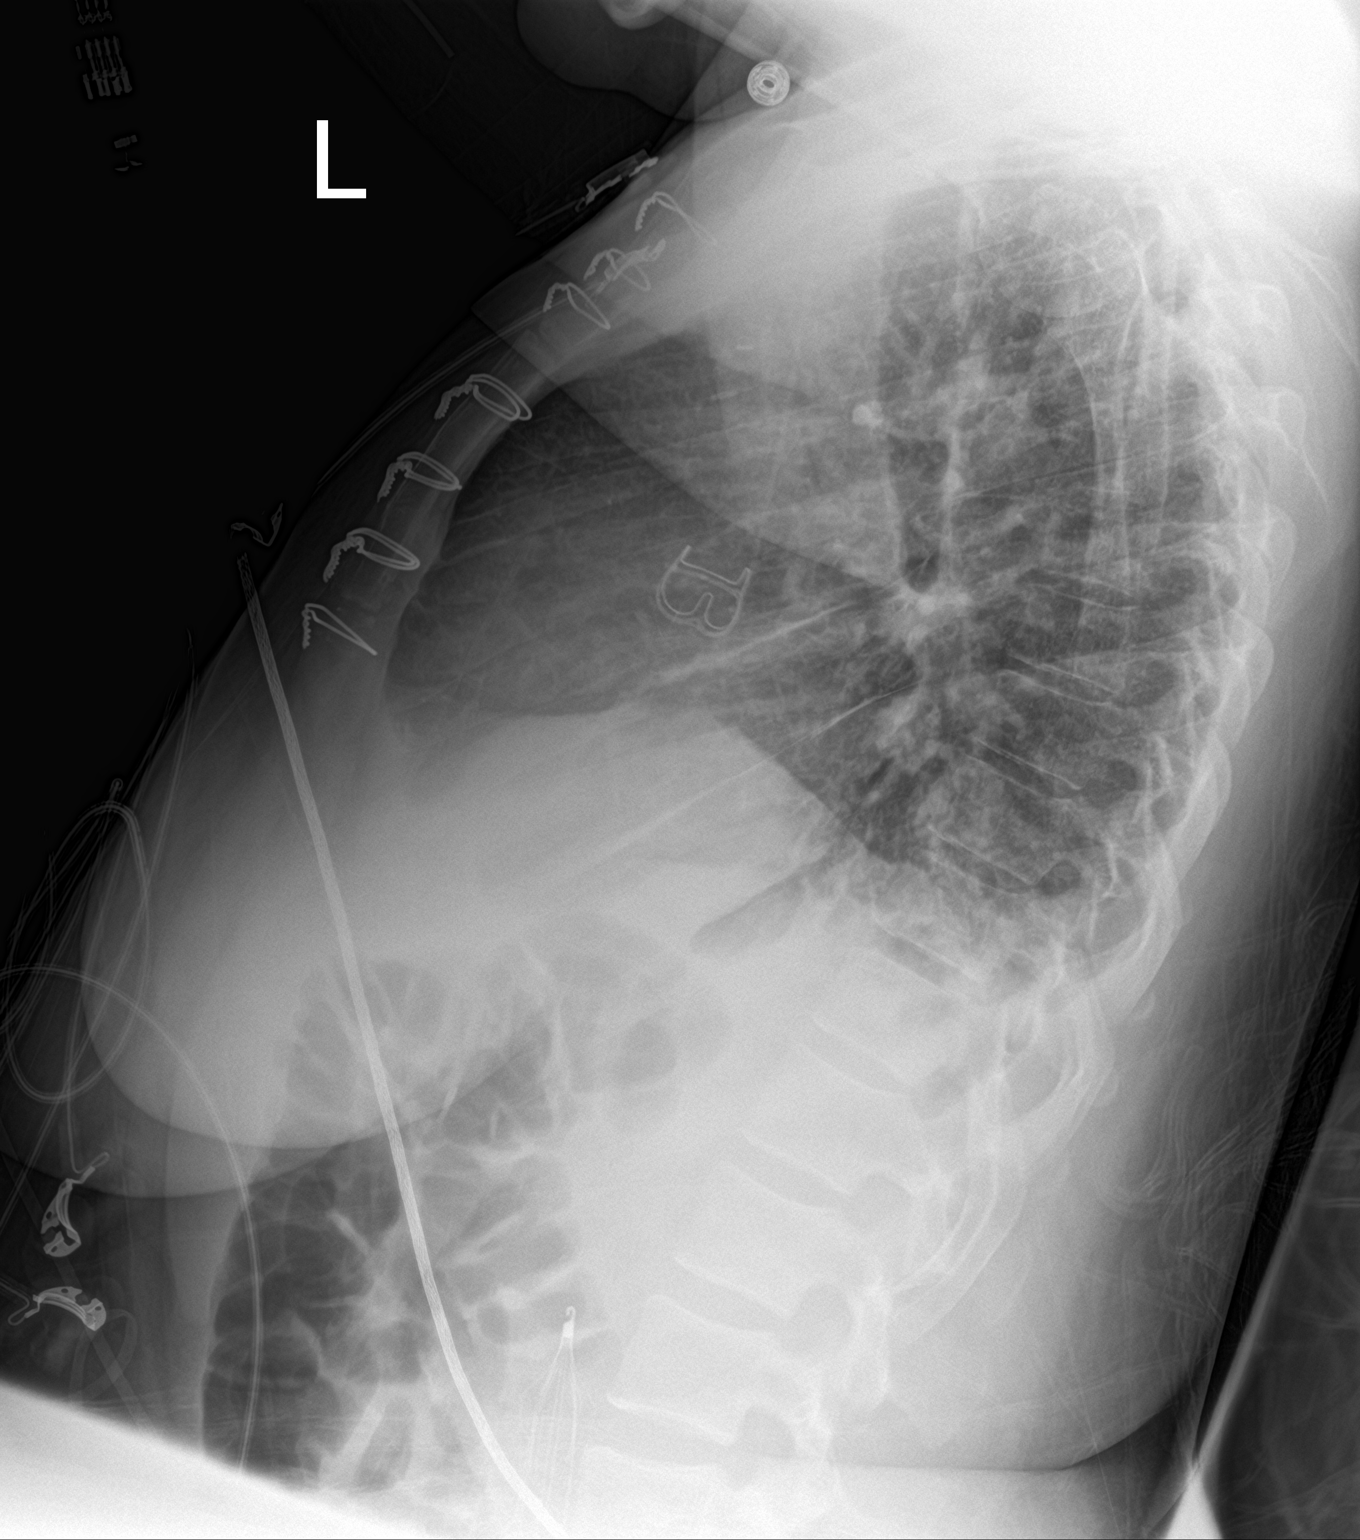

[chest ap]
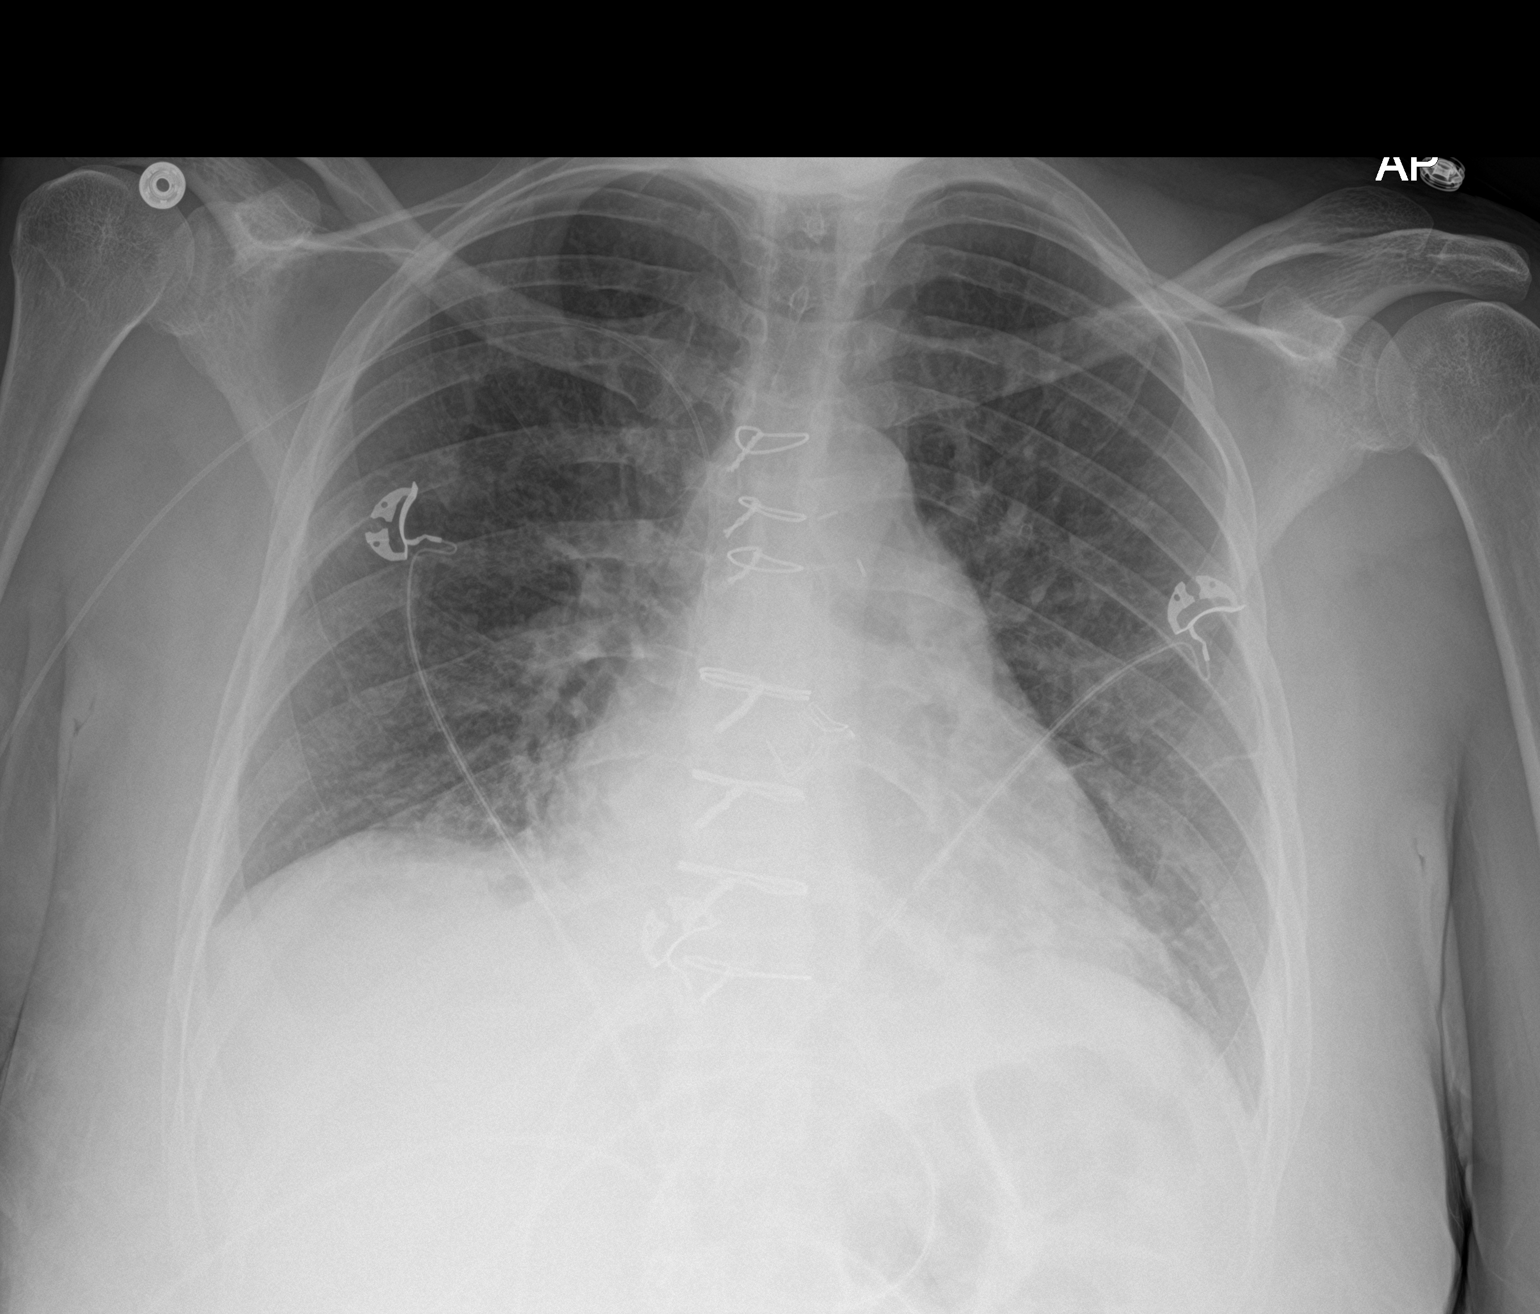

[2 of 2 positions shown; findings below may reference images not displayed]

FINDINGS: Postoperative changes in the mediastinum. Aortic valve prosthesis.
Cardiac enlargement with prominent left pulmonary outflow tract.
Mild vascular congestion. Mild interstitial edema. Small bilateral
pleural effusions. No pneumothorax. Inferior vena caval filter.
Right PICC line with tip over the cavoatrial junction region.
IMPRESSION: Cardiac enlargement with pulmonary vascular congestion, interstitial
edema, and small bilateral pleural effusions.

## 2022-12-26 ENCOUNTER — Telehealth (HOSPITAL_COMMUNITY): Payer: Self-pay | Admitting: Cardiology

## 2022-12-26 DIAGNOSIS — I5022 Chronic systolic (congestive) heart failure: Secondary | ICD-10-CM

## 2022-12-26 NOTE — Telephone Encounter (Signed)
Patient called to request an appt to reestablish care. [place]   Appt scheduled

## 2023-02-06 ENCOUNTER — Ambulatory Visit (HOSPITAL_COMMUNITY)
Admission: RE | Admit: 2023-02-06 | Discharge: 2023-02-06 | Disposition: A | Discharge status: Home / Self Care | Payer: Medicaid Other | Source: Ambulatory Visit | Attending: Cardiology | Admitting: Cardiology

## 2023-02-06 ENCOUNTER — Ambulatory Visit (HOSPITAL_BASED_OUTPATIENT_CLINIC_OR_DEPARTMENT_OTHER)
Admission: RE | Admit: 2023-02-06 | Discharge: 2023-02-06 | Disposition: A | Discharge status: Home / Self Care | Payer: Medicaid Other | Source: Ambulatory Visit | Attending: Cardiology | Admitting: Cardiology

## 2023-02-06 ENCOUNTER — Other Ambulatory Visit (HOSPITAL_COMMUNITY): Payer: Self-pay

## 2023-02-06 ENCOUNTER — Encounter (HOSPITAL_COMMUNITY): Payer: Self-pay | Admitting: Cardiology

## 2023-02-06 VITALS — BP 120/78 | HR 78 | Wt 259.0 lb

## 2023-02-06 DIAGNOSIS — F1410 Cocaine abuse, uncomplicated: Secondary | ICD-10-CM | POA: Diagnosis not present

## 2023-02-06 DIAGNOSIS — I082 Rheumatic disorders of both aortic and tricuspid valves: Secondary | ICD-10-CM | POA: Insufficient documentation

## 2023-02-06 DIAGNOSIS — I5042 Chronic combined systolic (congestive) and diastolic (congestive) heart failure: Secondary | ICD-10-CM | POA: Diagnosis present

## 2023-02-06 DIAGNOSIS — Z95828 Presence of other vascular implants and grafts: Secondary | ICD-10-CM | POA: Insufficient documentation

## 2023-02-06 DIAGNOSIS — Z91148 Patient's other noncompliance with medication regimen for other reason: Secondary | ICD-10-CM | POA: Insufficient documentation

## 2023-02-06 DIAGNOSIS — Z79899 Other long term (current) drug therapy: Secondary | ICD-10-CM | POA: Insufficient documentation

## 2023-02-06 DIAGNOSIS — I7771 Dissection of carotid artery: Secondary | ICD-10-CM | POA: Insufficient documentation

## 2023-02-06 DIAGNOSIS — I5022 Chronic systolic (congestive) heart failure: Secondary | ICD-10-CM

## 2023-02-06 DIAGNOSIS — Z86718 Personal history of other venous thrombosis and embolism: Secondary | ICD-10-CM | POA: Insufficient documentation

## 2023-02-06 DIAGNOSIS — Z953 Presence of xenogenic heart valve: Secondary | ICD-10-CM | POA: Insufficient documentation

## 2023-02-06 DIAGNOSIS — Z8673 Personal history of transient ischemic attack (TIA), and cerebral infarction without residual deficits: Secondary | ICD-10-CM | POA: Diagnosis not present

## 2023-02-06 DIAGNOSIS — Z7902 Long term (current) use of antithrombotics/antiplatelets: Secondary | ICD-10-CM | POA: Insufficient documentation

## 2023-02-06 DIAGNOSIS — F1911 Other psychoactive substance abuse, in remission: Secondary | ICD-10-CM | POA: Insufficient documentation

## 2023-02-06 LAB — CBC
HCT: 42.2 % (ref 36.0–46.0)
Hemoglobin: 13.7 g/dL (ref 12.0–15.0)
MCH: 28.7 pg (ref 26.0–34.0)
MCHC: 32.5 g/dL (ref 30.0–36.0)
MCV: 88.3 fL (ref 80.0–100.0)
Platelets: 269 10*3/uL (ref 150–400)
RBC: 4.78 MIL/uL (ref 3.87–5.11)
RDW: 14.9 % (ref 11.5–15.5)
WBC: 5.5 10*3/uL (ref 4.0–10.5)
nRBC: 0 % (ref 0.0–0.2)

## 2023-02-06 LAB — ECHOCARDIOGRAM COMPLETE
AR max vel: 0.74 cm2
AV Area VTI: 0.75 cm2
AV Area mean vel: 0.67 cm2
AV Mean grad: 44 mm[Hg]
AV Peak grad: 70.9 mm[Hg]
Ao pk vel: 4.21 m/s
Area-P 1/2: 4.6 cm2
S' Lateral: 3.3 cm

## 2023-02-06 LAB — LIPID PANEL
Cholesterol: 223 mg/dL — ABNORMAL HIGH (ref 0–200)
HDL: 90 mg/dL (ref >40)
LDL Cholesterol: 107 mg/dL — ABNORMAL HIGH (ref 0–99)
Total CHOL/HDL Ratio: 2.5 {ratio}
Triglycerides: 132 mg/dL (ref <150)
VLDL: 26 mg/dL (ref 0–40)

## 2023-02-06 LAB — COMPREHENSIVE METABOLIC PANEL WITH GFR
ALT: 18 U/L (ref 0–44)
AST: 18 U/L (ref 15–41)
Albumin: 3.5 g/dL (ref 3.5–5.0)
Alkaline Phosphatase: 67 U/L (ref 38–126)
Anion gap: 8 (ref 5–15)
BUN: 13 mg/dL (ref 6–20)
CO2: 24 mmol/L (ref 22–32)
Calcium: 9.1 mg/dL (ref 8.9–10.3)
Chloride: 110 mmol/L (ref 98–111)
Creatinine, Ser: 0.87 mg/dL (ref 0.44–1.00)
GFR, Estimated: >60 mL/min
Glucose, Bld: 93 mg/dL (ref 70–99)
Potassium: 4.8 mmol/L (ref 3.5–5.1)
Sodium: 142 mmol/L (ref 135–145)
Total Bilirubin: 0.6 mg/dL (ref 0.0–1.2)
Total Protein: 6.5 g/dL (ref 6.5–8.1)

## 2023-02-06 LAB — TSH: TSH: 1.651 u[IU]/mL (ref 0.350–4.500)

## 2023-02-06 LAB — BRAIN NATRIURETIC PEPTIDE: B Natriuretic Peptide: 74.7 pg/mL (ref 0.0–100.0)

## 2023-02-06 MED ORDER — CARVEDILOL 3.125 MG PO TABS: 3.1250 mg | ORAL_TABLET | Freq: Two times a day (BID) | ORAL | 1 refills | Status: DC

## 2023-02-06 MED ORDER — ENTRESTO 24-26 MG PO TABS: 1.0000 | ORAL_TABLET | Freq: Two times a day (BID) | ORAL | 11 refills | Status: DC

## 2023-02-06 MED ORDER — FUROSEMIDE 40 MG PO TABS: 20.0000 mg | ORAL_TABLET | Freq: Every day | ORAL | 3 refills | Status: DC

## 2023-02-06 MED ORDER — SPIRONOLACTONE 25 MG PO TABS: 25.0000 mg | ORAL_TABLET | Freq: Every day | ORAL | 3 refills | Status: DC

## 2023-02-06 MED ORDER — DAPAGLIFLOZIN PROPANEDIOL 10 MG PO TABS: 10.0000 mg | ORAL_TABLET | Freq: Every day | ORAL | 3 refills | Status: DC

## 2023-02-06 MED ORDER — ASPIRIN 81 MG PO TBEC: 81.0000 mg | DELAYED_RELEASE_TABLET | Freq: Every day | ORAL | Status: AC

## 2023-02-06 NOTE — Progress Notes (Signed)
  Echocardiogram 2D Echocardiogram has been performed.  Lauren Reyes 02/06/2023, 10:50 AM

## 2023-02-06 NOTE — Patient Instructions (Addendum)
STOP Plavix and Digoxin   RESTART Carvedilol 3.125 mg Twice daily  Entresto 24/26 mg Twice daily  Farxiga 10 mg daily.  Lasix 20 mg daily.  Spironolactone 25 mg daily.  Asprin 81 mg daily.  Labs done today, your results will be available in MyChart, we will contact you for abnormal readings.  Repeat blood work in 10 days.  You have been referred to back to have your filter removed. You will be called to have this procedure scheduled.  Your physician recommends that you schedule a follow-up appointment in: 3 weeks  If you have any questions or concerns before your next appointment please send [place] a message through [place] or call our office at [phone].    TO LEAVE A MESSAGE FOR THE NURSE SELECT OPTION 2, PLEASE LEAVE A MESSAGE INCLUDING: YOUR NAME DATE OF BIRTH CALL BACK NUMBER REASON FOR CALL**this is important as we prioritize the call backs  YOU WILL RECEIVE A CALL BACK THE SAME DAY AS LONG AS YOU CALL BEFORE 4:00 PM  At the [facility], you and your health needs are our priority. As part of our continuing mission to provide you with exceptional heart care, we have created designated Provider Care Teams. These Care Teams include your primary Cardiologist (physician) and Advanced Practice Providers (APPs- Physician Assistants and Nurse Practitioners) who all work together to provide you with the care you need, when you need it.   You may see any of the following providers on your designated Care Team at your next follow up: Dr Arvilla Meres Dr Marca Ancona Dr. Dorthula Nettles Dr. Clearnce Hasten Amy Filbert Schilder, NP Robbie Lis, [place] [facility] [place], [place] Brynda Peon, NP [place] Lee, NP [name], PharmD   Please be sure to bring in all your medications bottles to every appointment.    Thank you for choosing [facility]

## 2023-02-07 ENCOUNTER — Telehealth (HOSPITAL_COMMUNITY): Payer: Self-pay | Admitting: Pharmacy Technician

## 2023-02-07 NOTE — Telephone Encounter (Signed)
Patient Advocate Encounter   Received notification from [employer] that prior authorization for Dapagliflozin is required.   PA submitted on CoverMyMeds Key  [id number] Status is pending   Will continue to follow.

## 2023-02-07 NOTE — Progress Notes (Addendum)
PCP: Laurey Morale, MD Cardiology: Dr. Mayford Knife HF Cardiology: Dr. Shirlee Latch  Chief Complaint: Re-establish care for CHF  33 y.o. with history of polysubstance abuse (cocaine, heroin, methamphetamines) and smoking, endocarditis, and prolonged hospitalization 3/21-6/21 was initially referred by Dr. Mayford Knife for evaluation of new onset cardiomyopathy.  Patient was admitted to [facility] in 3/21 with left MCA CVA. She had revascularization of the MCA by IR, procedure complicated by left carotid dissection treated with a stent. TEE showed endocarditis => large tricuspid regurgitation with mild to moderate TR, small MV vegetation with mild MR, and aortic valve vegetation (bicuspid valve) with severe AI.  Blood cultures remained negative, she was treated with empiric abx. She had septic emboli to the brain, kidneys, spleen, and lungs.  On 04/28/19, she had bioprosthetic AVR and debridement + repair of the tricuspid valve. She additionally had a right CFV DVT.  As she was not able to be anticoagulated with septic emboli to the brain as well as the carotid dissection, she had an IVC filter placed.  After a prolonged course, she was discharged in 6/21.  She is now living at home with her mother.  She has not smoked or used drugs since she was admitted in 3/21.    Echo was done in 9/21, this showed no vegetations but EF was down to 20-25% with severely decreased RV systolic function, moderate MR, moderate TR s/p TV repair, bioprosthetic AoV functioning normally.  Of note, intra-op TEE at time of valve surgery showed normal EF. She did not have a post-op echo until 9/21. She has no history of cardiomyopathy or MI in her family.   In 9/21, she had RHC/LHC showing no CAD, markedly elevated filling pressures and low cardiac output.  There was a fistulous connection from the aortic root to the left ventricle.  She was admitted and started on milrinone and diuresed.  TEE was done, showing EF 25%, moderately decreased RV systolic  function, bioprosthetic valve with mean gradient 15 mmHg, serpiginous perivalvular leakage from aortic root to LV, appeared moderate in severity.  Cardiac MRI showed LV EF 18%, RV EF 21%, nonspecific RV insertion site LGE.   Echo in 2/22 with EF 40-45% with mild LV dilation, mild MR, s/p TV repair, s/p bioprosthetic AVR with mean gradient markedly higher than in the past at 47 mmHg with trivial AI.  TEE was then done in 2/22, showing EF 45%, mild LV enlargement, normla RV, s/p TV repair with mild TR, bioprosthetic aortic valve with moderate serpiginous peri-valvular leakage from aortic root to LV, mean aortic valve gradient 47 mmHg with DI 0.26 and AVA 0.6 cm^2.   Patient was lost to followup for over 2 years.  She has been living and working in [place], now has returned to the [place].    Echo was done today and reviewed, showing EF 60-65%, normal RV size/systolic function, trivial MR, s/p TV repair with mild TR, s/p bioprosthetic aortic valve with mean gradient 44 mmHg and AVA 0.75 cm^2, cannot see perivalvular leakage; IVC normal.   She presents today to re-establish care for CHF and valvular heart disease.  She had all her medications most of the time while she was in [place] but ran out of everything except aspirin and Plavix in 12/24.  Her weight is up 39 lbs since I last saw her in 2022. She says that her weight has increased since she came home from [place] and especially since running out of her medications.  She says that "I can  feel that I am off my medications" but she is not particularly short of breath with exertion.  No chest pain.  No lightheadedness or palpitations.  No episodes of presyncope or syncope. She has chronic headaches centered over her posterior head.   ECG (personally reviewed): NSR, nonspecific T wave changes.     Labs (5/21): creatinine 0.73 Labs (10/21): K 3.5, creatinine 0.95, digoxin level 0.6 Labs (12/07/2019): K 4.2 Creatinine 0.9  Labs (12/21): K 3.7, creatinine  0.94  PMH: 1. Polysubstance abuse: Cocaine, heroin, methamphetamines.  None since 3/21. 2. Prior smoker: Stopped 3/21.  3. CVA: 3/21, septic embolus.  She had MCA revascularization by IR.  - Left ICA dissection as complication of procedure requiring stent placement.  4. Endocarditis: 3/21.  Culture negative.  She had septic emboli to brain, kidney, spleen and lungs.  - TEE (3/21): EF 55-60%, normal RV, small MV vegetation with mild MR, large TV vegetation with mild-moderate TR, bicuspid aortic valve with aortic valve endocarditis and severe AI.  - 04/28/19 she had bioprosthetic AVR and debridement + repair of tricuspid valve.  5. DVT (3/21): IVC filter => not candidate for anticoagulation given septic emboli, dissection of carotid with IR procedure initially.  6. Chronic systolic CHF: Echo in 9/21 showed EF 20-25%, global hypokinesis, severely decreased RV systolic function, moderate MR, moderate TR s/p TV repair, bioprosthetic aortic valve with mean gradient 10 mmHg and no regurgitation; no vegetation noted. Most recent prior echo was the TEE done at the time of surgery in 4/21, EF was normal then.  - LHC/RHC (9/21): No signifcant CAD; mean RA 14, PA 56/26, mean PCWP 28, CI 1.62, PVR 3.7.  Fistulous connection aortic root to LV.  - TEE (9/21): EF 25%, moderately decreased RV systolic function, bioprosthetic aortic valve with mean gradient 15 mmHg, serpiginous perivalvular leakage, moderate in severity.  - Cardiac MRI: Nonspecific RV insertion site LGE. LV EF 18%, RV EF 21%.  - Echo (2/22): EF 40-45% with mild LV dilation, mild MR, s/p TV repair, s/p bioprosthetic AVR with mean gradient markedly higher than in the past at 47 mmHg with trivial AI.   - TEE (2/22): EF 45%, mild LV enlargement, normla RV, s/p TV repair with mild TR, bioprosthetic aortic valve with moderate serpiginous peri-valvular leakage from aortic root to LV, mean aortic valve gradient 47 mmHg with DI 0.26 and AVA 0.6 cm^2.  - Echo  (1/25): EF 60-65%, normal RV size/systolic function, trivial MR, s/p TV repair with mild TR, s/p bioprosthetic aortic valve with mean gradient 44 mmHg and AVA 0.75 cm^2, cannot see perivalvular leakage; IVC normal.  7. Fistulous connection aortic root to LV, moderate leakage on 9/21 TEE and 2/22 TEE.   Social History   Socioeconomic History   Marital status: Single    Spouse name: Not on file   Number of children: Not on file   Years of education: Not on file   Highest education level: Not on file  Occupational History   Not on file  Tobacco Use   Smoking status: Former    Current packs/day: 0.00    Types: Cigarettes    Quit date: 03/10/2019    Years since quitting: 3.9   Smokeless tobacco: Never  Vaping Use   Vaping status: Unknown  Substance and Sexual Activity   Alcohol use: Not Currently   Drug use: Not Currently   Sexual activity: Not on file  Other Topics Concern   Not on file  Social History Narrative  Not on file   Social Drivers of Health   Financial Resource Strain: Not on file  Food Insecurity: Not on file  Transportation Needs: Not on file  Physical Activity: Not on file  Stress: Not on file  Social Connections: Unknown (05/22/2021)   [facility], [facility]   Social Network    Social Network: Not on file  Intimate Partner Violence: Unknown (04/13/2021)   Received from [facility], [facility]   HITS    Physically Hurt: Not on file    Insult or Talk Down To: Not on file    Threaten Physical Harm: Not on file    Scream or Curse: Not on file   Family History  Problem Relation Age of Onset   Heart murmur Brother    CAD Neg Hx    Heart failure Neg Hx    ROS: All systems reviewed and negative except as per HPI.   Current Outpatient Medications  Medication Sig Dispense Refill   escitalopram (LEXAPRO) 10 MG tablet Take 1 tablet (10 mg total) by mouth daily. 90 tablet 1   potassium chloride SA (KLOR-CON M) 20 MEQ tablet Take 1 tablet  by mouth once daily 30 tablet 0   sacubitril-valsartan (ENTRESTO) 24-26 MG Take 1 tablet by mouth 2 (two) times daily. 60 tablet 11   SODIUM FLUORIDE 5000 SENSITIVE 1.1-5 % GEL SMARTSIG:Sparingly By Mouth Daily     aspirin EC 81 MG tablet Take 1 tablet (81 mg total) by mouth daily.     busPIRone (BUSPAR) 7.5 MG tablet TAKE 1 TABLET BY MOUTH TWICE DAILY . APPOINTMENT REQUIRED FOR FUTURE REFILLS (Patient not taking: Reported on 02/06/2023) 30 tablet 0   carvedilol (COREG) 3.125 MG tablet Take 1 tablet (3.125 mg total) by mouth 2 (two) times daily. 60 tablet 1   dapagliflozin propanediol (FARXIGA) 10 MG TABS tablet Take 1 tablet (10 mg total) by mouth daily before breakfast. 90 tablet 3   furosemide (LASIX) 40 MG tablet Take 0.5 tablets (20 mg total) by mouth daily. 180 tablet 3   spironolactone (ALDACTONE) 25 MG tablet Take 1 tablet (25 mg total) by mouth daily. 90 tablet 3   No current facility-administered medications for this encounter.   BP 120/78   Pulse 78   Wt 117.5 kg (259 lb)   SpO2 100%   BMI 40.57 kg/m   Wt Readings from Last 3 Encounters:  02/06/23 117.5 kg (259 lb)  10/18/20 99.9 kg (220 lb 3.2 oz)  04/18/20 96 kg (211 lb 9.6 oz)   General: NAD Neck: JVP 8 cm, no thyromegaly or thyroid nodule.  Lungs: Clear to auscultation bilaterally with normal respiratory effort. CV: Nondisplaced PMI.  Heart regular S1/S2, no S3/S4, 2/6 SEM RUSB with clear S2.  Trace ankle edema.  No carotid bruit.  Normal pedal pulses.  Abdomen: Soft, nontender, no hepatosplenomegaly, no distention.  Skin: Intact without lesions or rashes.  Neurologic: Alert and oriented x 3.  Psych: Normal affect. Extremities: No clubbing or cyanosis.  HEENT: Normal.   Assessment/Plan: 1. Chronic systolic => diastolic CHF: 9/21 echo showed no vegetations but EF was down to 20-25% with severely decreased RV systolic function, moderate MR, moderate TR s/p TV repair, bioprosthetic AoV functioning normally (but there is  evidence of peri-valvular leakage).  Of note, intra-op TEE at time of valve surgery showed normal EF. She did not have a post-op echo until 9/21.  Cause of biventricular failure is uncertain, ?viral myocarditis ("cold" in 8/21), ?volume  load from aortic root => LV fistula. RHC in 9/21 showed significantly elevated PCWP and RA pressure, cardiac index was low at 1.6.  Cardiac MRI showed nonspecific RV insertion site LGE. Echo in 2/22 showed improved LV function to EF 40-45% but concerning for severe bioprosthetic stenosis (?bioprosthetic valve thrombosis).  TEE in 2/22 showed EF 45%, mild LV enlargement, normla RV, s/p TV repair with mild TR, bioprosthetic aortic valve with moderate serpiginous peri-valvular leakage from aortic root to LV, mean aortic valve gradient 47 mmHg with DI 0.26 and AVA 0.6 cm^2. She was lost to followup for >2 years.  Echo today was reviewed and showed EF 60-65%, normal RV size/systolic function, trivial MR, s/p TV repair with mild TR, s/p bioprosthetic aortic valve with mean gradient 44 mmHg and AVA 0.75 cm^2, cannot see perivalvular leakage; IVC normal.  NYHA class I-II, probably mild volume overload.  She has been off all her HF meds for about a month.   - Discussed importance of pregnancy prevention with her HF meds.   - Restart Lasix at 40 mg daily (was on bid).  - Restart Coreg at 3.125 mg bid.  - Restart Entresto at 24/26 bid.    - Restart spironolactone 25 daily.  - With improved EF, she does not need to restart digoxin.  - Restart dapagliflozin 10 mg daily.   - BMET/BNP today, BMET in 10 days.  2. H/o endocarditis: Culture negative.  S/p bioprosthetic aortic valve replacement and TV repair. 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 TEE in 2/22 did not appear to show active vegetation.  3. Bicuspid aortic valve disorder: Now s/p bioprosthetic AVR.  Thoracic aorta not dilated on 3/21 CT.  4. H/o R CFV DVT:  Has IVC filter. I think this needs to be removed.  - Will refer to IR to consider removal of IVC filter.   5. H/o CVA: 3/21, suspect septic embolization. Had left MCA revascularization by IR, complicated by left ICA dissection and stent placement.  - On ASA 81 + Plavix for stent. I think she can stop Plavix at this point and continue ASA 81.  - Check carotid dopplers.  6. Polysubstance abuse: No drugs, ETOH, or smoking since 3/21.  7. 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  Suspect this is a sequelae of prior endocarditis.  TEE in 9/21 and again in 2/22 showed aortic root to LV peri-valvular leakage with a serpiginous course, peri-valvular leakage appears no more than moderate in severity.  This is not well visualized on TTEs and is not seen on the study today. - Have discussed with Dr. Excell Seltzer with the structural heart service, will follow for now with serial echoes.  8. Bioprosthetic valve stenosis:  TEE in 2/22 showed EF 45%, mild LV enlargement, normla RV, s/p TV repair with mild TR, bioprosthetic aortic valve with moderate serpiginous peri-valvular leakage from aortic root to LV, mean aortic valve gradient 47 mmHg with DI 0.26 and AVA 0.6 cm^2.  The aortic valve did not show significant degeneration and there was no partial thrombosis noted. I suspect that the elevated gradient is due to small valve with patient-prosthesis mismatch as well as high flow from moderate aortic insufficiency. Echo today showed similar mean gradient 44 mmHg and AVA 0.75 cm^2. She has minimal exertional symptoms.  - No plan for intervention at this point but will need to follow valve closely.  - She will  need antibiotic prophylaxis with dental work.   Followup in 3 wks with APP to reassess after restarting medications.   I spent 62 minutes reviewing records and studies, interviewing/examining patient, and managing orders.      Marca Ancona  02/07/2023

## 2023-02-08 NOTE — Telephone Encounter (Signed)
Advanced Heart Failure Patient Advocate Encounter  Prior Authorization for Marcelline Deist has been approved.    PA# [id number] Effective dates: 02/07/23 through 02/07/24  Archer Asa, CPhT

## 2023-02-18 ENCOUNTER — Other Ambulatory Visit (HOSPITAL_COMMUNITY): Payer: Medicaid Other

## 2023-02-27 ENCOUNTER — Encounter (HOSPITAL_COMMUNITY): Payer: Medicaid Other

## 2023-02-27 ENCOUNTER — Other Ambulatory Visit (HOSPITAL_COMMUNITY): Payer: Medicaid Other

## 2023-03-04 ENCOUNTER — Ambulatory Visit (HOSPITAL_COMMUNITY)
Admission: RE | Admit: 2023-03-04 | Discharge: 2023-03-04 | Disposition: A | Discharge status: Home / Self Care | Payer: Medicaid Other | Source: Ambulatory Visit | Attending: Cardiology | Admitting: Cardiology

## 2023-03-04 ENCOUNTER — Other Ambulatory Visit (HOSPITAL_COMMUNITY): Payer: Medicaid Other

## 2023-03-04 DIAGNOSIS — I5022 Chronic systolic (congestive) heart failure: Secondary | ICD-10-CM | POA: Diagnosis present

## 2023-03-04 LAB — BASIC METABOLIC PANEL
Anion gap: 7 (ref 5–15)
BUN: 13 mg/dL (ref 6–20)
CO2: 23 mmol/L (ref 22–32)
Calcium: 8.8 mg/dL — ABNORMAL LOW (ref 8.9–10.3)
Chloride: 108 mmol/L (ref 98–111)
Creatinine, Ser: 0.89 mg/dL (ref 0.44–1.00)
GFR, Estimated: >60 mL/min (ref >60)
Glucose, Bld: 80 mg/dL (ref 70–99)
Potassium: 4.2 mmol/L (ref 3.5–5.1)
Sodium: 138 mmol/L (ref 135–145)

## 2023-03-14 NOTE — Progress Notes (Signed)
 PCP: Laurey Morale, MD Cardiology: Dr. Mayford Knife HF Cardiology: Dr. Shirlee Latch  Chief Complaint: CHF  33 y.o. 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  Patient was admitted to [facility] in 3/21 with left MCA CVA. She had revascularization of the MCA by IR, procedure complicated by left carotid dissection treated with a stent. TEE showed endocarditis => large tricuspid regurgitation with mild to moderate TR, small MV vegetation with mild MR, and aortic valve vegetation (bicuspid valve) with severe AI.  Blood cultures remained negative, she was treated with empiric abx. She had septic emboli to the brain, kidneys, spleen, and lungs.  On 04/28/19, she had bioprosthetic AVR and debridement + repair of the tricuspid valve. She additionally had a right CFV DVT.  As she was not able to be anticoagulated with septic emboli to the brain as well as the carotid dissection, she had an IVC filter placed.  After a prolonged course, she was discharged in 6/21.   Echo 9/21: no vegetations but EF was down to 20-25% with severely decreased RV systolic function, moderate MR, moderate TR s/p TV repair, bioprosthetic AoV functioning normally.  Of note, intra-op TEE at time of valve surgery showed normal EF. She did not have a post-op echo until 9/21.   In 9/21 RHC/LHC w/ no CAD, markedly elevated filling pressures and low cardiac output.  There was a fistulous connection from the aortic root to the left ventricle.  She was admitted and started on milrinone and diuresed.  TEE: EF 25%, moderately decreased RV systolic function, bioprosthetic valve with mean gradient 15 mmHg, serpiginous perivalvular leakage from aortic root to LV, appeared moderate in severity.  Cardiac MRI showed LV EF 18%, RV EF 21%, nonspecific RV insertion site LGE.    TEE: 2/22, EF 45%, mild LV enlargement, normla RV, s/p TV repair with mild TR, bioprosthetic aortic valve with moderate serpiginous peri-valvular leakage from aortic root to LV, mean aortic valve gradient 47 mmHg with DI 0.26 and AVA 0.6 cm^2.  Echo 01/25 EF 60-65%, normal RV size/systolic function, trivial MR, s/p TV repair with mild TR, s/p bioprosthetic aortic valve with mean gradient 44 mmHg and AVA 0.75 cm^2, cannot see perivalvular leakage; IVC normal.   Reestablished care with HF clinic in 01/25. She was volume overloaded and had been off all of her HF medications. Diuretics and GDMT with spironolactone, entresto, carvedilol, and Farxgia restarted.   Here today for HF clinic follow-up. Doing well from HF standpoint. Taking all medications as prescribed. No dyspnea, orthopnea, PND or lower extremity edema. Has gained about 40 lb over the last few years. She has struggled with weight since childhood and reports using food to cope with depression/anxiety. 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 No drug or tobacco use, drinks 4-5 "tall" beers a week.     Social History   Socioeconomic History   Marital status: Single    Spouse name: Not on file   Number of children: Not on file   Years of education: Not on file   Highest education level: Not on file  Occupational History   Not on file  Tobacco Use   Smoking status: Former    Current packs/day: 0.00    Types: Cigarettes    Quit date: 03/10/2019    Years since quitting: 4.0   Smokeless tobacco: Never  Vaping Use   Vaping status: Unknown  Substance and Sexual Activity   Alcohol use: Not Currently   Drug use: Not Currently   Sexual activity: Not on file  Other Topics Concern   Not on file  Social History Narrative   Not on file   Social Drivers of Health   Financial Resource Strain: Not on file  Food Insecurity: Not on file  Transportation Needs: Not on file  Physical Activity: Not  on file  Stress: Not on file  Social Connections: Unknown (05/22/2021)   Received from [facility], [facility]   Social Network    Social Network: Not on file  Intimate Partner Violence: Unknown (04/13/2021)   Received from [facility], [facility]   HITS    Physically Hurt: Not on file    Insult or Talk Down To: Not on file    Threaten Physical Harm: Not on file    Scream or Curse: Not on file   Family History  Problem Relation Age of Onset   Heart murmur Brother    CAD Neg Hx    Heart failure Neg Hx    ROS: All systems reviewed and negative except as per HPI.   Current Outpatient Medications  Medication Sig Dispense Refill   aspirin EC 81 MG tablet Take 1 tablet (81 mg total) by mouth daily.     busPIRone (BUSPAR) 7.5 MG tablet TAKE 1 TABLET BY MOUTH TWICE DAILY . APPOINTMENT REQUIRED FOR FUTURE REFILLS 30 tablet 0   carvedilol (COREG) 3.125 MG tablet Take 1 tablet (3.125 mg total) by mouth 2 (two) times daily. 60 tablet 1   dapagliflozin propanediol (FARXIGA) 10 MG TABS tablet Take 1 tablet (10 mg total) by mouth daily before breakfast. 90 tablet 3   escitalopram (LEXAPRO) 10 MG tablet Take 1 tablet (10 mg total) by mouth daily. 90 tablet 1   furosemide (LASIX) 40 MG tablet Take 0.5 tablets (20 mg total) by mouth daily. 180 tablet 3   potassium chloride SA (KLOR-CON M) 20 MEQ tablet Take 1 tablet by mouth once daily 30 tablet 0   sacubitril-valsartan (ENTRESTO) 24-26 MG Take 1 tablet by mouth 2 (two) times daily. 60 tablet 11   SODIUM FLUORIDE 5000 SENSITIVE 1.1-5 % GEL SMARTSIG:Sparingly By Mouth Daily     spironolactone (ALDACTONE) 25 MG tablet Take 1 tablet (25 mg total) by mouth daily. 90 tablet 3   No current facility-administered medications for this encounter.   BP 124/86   Pulse 82   Ht 5\' 7"  (1.702 m)   Wt 120 kg (264 lb 9.6 oz)   SpO2 97%   BMI 41.44 kg/m   Wt Readings from Last 3 Encounters:  03/15/23 120 kg (264 lb 9.6 oz)  02/06/23 117.5 kg (259 lb)   10/18/20 99.9 kg (220 lb 3.2 oz)   General:  Well appearing.  Neck: no JVD.  Cor: Regular rate & rhythm. No rubs, gallops or murmurs. Lungs: clear Abdomen: obese, soft, nontender, nondistended.  Extremities: no edema Neuro: alert & orientedx3. Affect pleasant    Assessment/Plan: 1. Chronic systolic => diastolic CHF: 9/21 echo showed no vegetations but EF was down to 20-25% with severely decreased RV systolic function, moderate MR, moderate TR s/p TV repair, bioprosthetic AoV functioning normally (but there is evidence of peri-valvular leakage).   - Intra-op TEE at time of surgery: normal EF. She did not have a post-op echo until 9/21.  Cause of biventricular failure is uncertain, ?viral myocarditis ("cold" in  8/21), ?volume load from aortic root => LV fistula.  - RHC in 9/21 showed significantly elevated PCWP and RA pressure, cardiac index was low at 1.6.   - Cardiac MRI nonspecific RV insertion site LGE.  - Echo 2/22 EF 40-45% but concerning for severe bioprosthetic stenosis (?bioprosthetic valve thrombosis).  - Echo 01/25 EF 60-65%, normal RV size/systolic function, trivial MR, s/p TV repair with mild TR, s/p bioprosthetic aortic valve with mean gradient 44 mmHg and AVA 0.75 cm^2, cannot see perivalvular leakage; IVC normal.   - NYHA II. Volume looks good. - Continue lasix 20 mg daily.  - Restart Coreg at 3.125 mg bid.  - Increase Entresto to 49/51 mg BID - Continue spironolactone 25 daily.  - Continue dapagliflozin 10 mg daily.   - Avoid pregnancy with HF medications - BMET 2 weeks - Limit ETOH intake  2. H/o endocarditis: Culture negative.  S/p bioprosthetic aortic valve replacement and TV repair. 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 TEE in 2/22 did not appear to show active vegetation.   3. Bicuspid aortic valve disorder: Now s/p bioprosthetic AVR.  Thoracic aorta not dilated on 3/21 CT.   4. H/o R CFV  DVT: Has IVC filter. I think this needs to be removed.  - Referred to IR to consider removing IVC filter last visit. She states she was not called. Will follow-up on referral.  5. H/o CVA: 3/21, suspect septic embolization. Had left MCA revascularization by IR, complicated by left ICA dissection and stent placement.  - Continue ASA 81 mg daily - Check carotid dopplers  6. Polysubstance abuse: No drugs or smoking since 3/21. Does consume ETOH, discussed the importance of limiting intake.  7. 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  Suspect this is a sequelae of prior endocarditis.  TEE in 9/21 and again in 2/22 showed aortic root to LV peri-valvular leakage with a serpiginous course, peri-valvular leakage appears no more than moderate in severity.  This is not well visualized on TTEs and is not seen on study 01/25. - Have discussed with Dr. Excell Seltzer with the structural heart service, will follow for now with serial echoes.   8. Bioprosthetic valve stenosis:  TEE in 2/22 showed EF 45%, mild LV enlargement, normla RV, s/p TV repair with mild TR, bioprosthetic aortic valve with moderate serpiginous peri-valvular leakage from aortic root to LV, mean aortic valve gradient 47 mmHg with DI 0.26 and AVA 0.6 cm^2.  The aortic valve did not show significant degeneration and there was no partial thrombosis noted. Suspect that the elevated gradient is due to small valve with patient-prosthesis mismatch as well as high flow from moderate aortic insufficiency.  - Echo 01/25 similar mean gradient 44 mmHg and AVA 0.75 cm^2.  - She has minimal exertional symptoms.  - No plan for intervention at this point but will need to follow valve closely.  - She will need antibiotic prophylaxis with dental work.  - Plan repeat echo in 01/26  9. Obesity Prediabetes - Body mass index is 41.44 kg/m. - She interested in trying GLP1RA. Refer to Pharmacy Clinic.  - Discussed portion control. - Last A1c 5.8% in 10/21.  Recheck A1c today.  Followup 3 months with APP   [facility], Uthman Mroczkowski N  03/15/2023

## 2023-03-15 ENCOUNTER — Encounter (HOSPITAL_COMMUNITY): Payer: Medicaid Other

## 2023-03-15 ENCOUNTER — Ambulatory Visit (HOSPITAL_COMMUNITY)
Admission: RE | Admit: 2023-03-15 | Discharge: 2023-03-15 | Disposition: A | Discharge status: Home / Self Care | Payer: Medicaid Other | Source: Ambulatory Visit | Attending: Physician Assistant | Admitting: Physician Assistant

## 2023-03-15 ENCOUNTER — Encounter (HOSPITAL_COMMUNITY): Payer: Self-pay

## 2023-03-15 VITALS — BP 124/86 | HR 82 | Ht 67.0 in | Wt 264.6 lb

## 2023-03-15 DIAGNOSIS — Z87891 Personal history of nicotine dependence: Secondary | ICD-10-CM | POA: Insufficient documentation

## 2023-03-15 DIAGNOSIS — Z953 Presence of xenogenic heart valve: Secondary | ICD-10-CM | POA: Insufficient documentation

## 2023-03-15 DIAGNOSIS — E66813 Obesity, class 3: Secondary | ICD-10-CM | POA: Diagnosis not present

## 2023-03-15 DIAGNOSIS — E669 Obesity, unspecified: Secondary | ICD-10-CM | POA: Diagnosis not present

## 2023-03-15 DIAGNOSIS — I5042 Chronic combined systolic (congestive) and diastolic (congestive) heart failure: Secondary | ICD-10-CM | POA: Insufficient documentation

## 2023-03-15 DIAGNOSIS — R7303 Prediabetes: Secondary | ICD-10-CM | POA: Insufficient documentation

## 2023-03-15 DIAGNOSIS — Z79899 Other long term (current) drug therapy: Secondary | ICD-10-CM | POA: Insufficient documentation

## 2023-03-15 DIAGNOSIS — I083 Combined rheumatic disorders of mitral, aortic and tricuspid valves: Secondary | ICD-10-CM | POA: Insufficient documentation

## 2023-03-15 DIAGNOSIS — I7771 Dissection of carotid artery: Secondary | ICD-10-CM

## 2023-03-15 DIAGNOSIS — Z86718 Personal history of other venous thrombosis and embolism: Secondary | ICD-10-CM | POA: Insufficient documentation

## 2023-03-15 DIAGNOSIS — Z7982 Long term (current) use of aspirin: Secondary | ICD-10-CM | POA: Diagnosis not present

## 2023-03-15 DIAGNOSIS — Z6841 Body Mass Index (BMI) 40.0 and over, adult: Secondary | ICD-10-CM

## 2023-03-15 DIAGNOSIS — Z8673 Personal history of transient ischemic attack (TIA), and cerebral infarction without residual deficits: Secondary | ICD-10-CM | POA: Insufficient documentation

## 2023-03-15 DIAGNOSIS — I5032 Chronic diastolic (congestive) heart failure: Secondary | ICD-10-CM | POA: Diagnosis present

## 2023-03-15 DIAGNOSIS — T82857D Stenosis of cardiac prosthetic devices, implants and grafts, subsequent encounter: Secondary | ICD-10-CM

## 2023-03-15 DIAGNOSIS — T82898D Other specified complication of vascular prosthetic devices, implants and grafts, subsequent encounter: Secondary | ICD-10-CM

## 2023-03-15 LAB — HEMOGLOBIN A1C
Hgb A1c MFr Bld: 5.3 % (ref 4.8–5.6)
Mean Plasma Glucose: 105.41 mg/dL

## 2023-03-15 MED ORDER — SACUBITRIL-VALSARTAN 49-51 MG PO TABS: 1.0000 | ORAL_TABLET | Freq: Two times a day (BID) | ORAL | 11 refills | Status: DC

## 2023-03-15 NOTE — Patient Instructions (Addendum)
 Thank you for coming in today  If you had labs drawn today, any labs that are abnormal the clinic will call you No news is good news  You have been referred to Left Carotid duplex, their office will call you for further appointment details   You have been referred to Pharmacy, their office will call you for further appointment details   Medications: Increase Entresto 49/51 mg 1 tablet twice daily  Follow up appointments: Your physician recommends that you return for lab work in: 2 weeks BMET  Your physician recommends that you schedule a follow-up appointment in:  3 months in clinic   Do the following things EVERYDAY: Weigh yourself in the morning before breakfast. Write it down and keep it in a log. Take your medicines as prescribed Eat low salt foods--Limit salt (sodium) to 2000 mg per day.  Stay as active as you can everyday Limit all fluids for the day to less than 2 liters   At the [facility], you and your health needs are our priority. As part of our continuing mission to provide you with exceptional heart care, we have created designated Provider Care Teams. These Care Teams include your primary Cardiologist (physician) and Advanced Practice Providers (APPs- Physician Assistants and Nurse Practitioners) who all work together to provide you with the care you need, when you need it.   You may see any of the following providers on your designated Care Team at your next follow up: Dr Arvilla Meres Dr Marca Ancona Dr. Marcos Eke, NP Robbie Lis, [place] [facility] [place], [place] Brynda Peon, NP [name], PharmD   Please be sure to bring in all your medications bottles to every appointment.    Thank you for choosing [facility]  If you have any questions or concerns before your next appointment please send [place] a message through Cable or call our office at [phone].    TO  LEAVE A MESSAGE FOR THE NURSE SELECT OPTION 2, PLEASE LEAVE A MESSAGE INCLUDING: YOUR NAME DATE OF BIRTH CALL BACK NUMBER REASON FOR CALL**this is important as we prioritize the call backs  YOU WILL RECEIVE A CALL BACK THE SAME DAY AS LONG AS YOU CALL BEFORE 4:00 PM

## 2023-04-02 ENCOUNTER — Other Ambulatory Visit (HOSPITAL_COMMUNITY)

## 2023-04-08 ENCOUNTER — Other Ambulatory Visit (HOSPITAL_COMMUNITY)

## 2023-04-15 ENCOUNTER — Ambulatory Visit (HOSPITAL_COMMUNITY)
Admission: RE | Admit: 2023-04-15 | Discharge: 2023-04-15 | Disposition: A | Discharge status: Home / Self Care | Source: Ambulatory Visit | Attending: Cardiology | Admitting: Cardiology

## 2023-04-15 DIAGNOSIS — I5032 Chronic diastolic (congestive) heart failure: Secondary | ICD-10-CM | POA: Insufficient documentation

## 2023-04-15 LAB — BASIC METABOLIC PANEL WITH GFR
Anion gap: 10 (ref 5–15)
BUN: 13 mg/dL (ref 6–20)
CO2: 21 mmol/L — ABNORMAL LOW (ref 22–32)
Calcium: 9.1 mg/dL (ref 8.9–10.3)
Chloride: 108 mmol/L (ref 98–111)
Creatinine, Ser: 0.7 mg/dL (ref 0.44–1.00)
GFR, Estimated: >60 mL/min (ref >60)
Glucose, Bld: 80 mg/dL (ref 70–99)
Potassium: 4.1 mmol/L (ref 3.5–5.1)
Sodium: 139 mmol/L (ref 135–145)

## 2023-05-08 ENCOUNTER — Ambulatory Visit: Attending: Cardiovascular Disease | Admitting: Pharmacist

## 2023-05-08 ENCOUNTER — Telehealth: Payer: Self-pay

## 2023-05-08 ENCOUNTER — Other Ambulatory Visit (HOSPITAL_COMMUNITY): Payer: Self-pay

## 2023-05-08 VITALS — Wt 269.0 lb

## 2023-05-08 DIAGNOSIS — E66813 Obesity, class 3: Secondary | ICD-10-CM

## 2023-05-08 DIAGNOSIS — Z6841 Body Mass Index (BMI) 40.0 and over, adult: Secondary | ICD-10-CM | POA: Diagnosis not present

## 2023-05-08 DIAGNOSIS — I6310 Cerebral infarction due to embolism of unspecified precerebral artery: Secondary | ICD-10-CM | POA: Diagnosis present

## 2023-05-08 DIAGNOSIS — E669 Obesity, unspecified: Secondary | ICD-10-CM | POA: Insufficient documentation

## 2023-05-08 NOTE — Progress Notes (Signed)
 Patient ID: Lauren Reyes                 DOB: 1990/07/09                    MRN: [id number]     HPI: Lauren Reyes is a 33 y.o. female patient referred to pharmacy clinic by Dr. Mitzie Anda to initiate GLP1-RA therapy. PMH is significant for polysubstance abuse (cocaine, heroin, methamphetamines) and smoking, bicuspid aortic valve, prior CVA, culture negative endocarditis s/p tricuspid valve repair and bioprosthetic AVR, chronic systolic CHF >> diastolic CHF and obesity. Most recent BMI 42.  Patient was admitted to [facility] in 3/21 with left MCA CVA. She had revascularization of the MCA by IR, procedure complicated by left carotid dissection treated with a stent.   Has gained about 40 lb over the last few years. She has struggled with weight since childhood and reports using food to cope with depression/anxiety. 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   Patient presents today to Pharm.D. clinic to discuss GLP-1 therapy.  Patient reports that she spent 2 years living in [place] and [place]  back in [place].  Reports that she has been eating a lot of foods that she has not had access to and realized in January how much weight she had gained.  She works at [employer].  She was eating a lot of her meals there during work.  Has since realized what this is done and is now trying to avoid eating at work.  Does admit that she eats the apple slices and drinks the apple juice.  She will sometimes take home a fish fillet sandwich as well.  Still eating a lot of fried foods which she knows is not good for her.  Eats a good amount of fruit but not so much vegetables.  She does drink 2-4 beers per her occasion, does not drink daily.  We did discuss that this now only is unhealthy due to the alcohol but also the calories makes it challenging for weight loss.  Encouraged her to significantly decrease this.  She works from 5 AM to 2 PM.  Generally does not eat  breakfast.  Her and a coworker are thinking about getting a membership to the gym that is next to work.  The exercise she is most comfortable with is walking.   Diet:  Breakfast: nones Lunch: fish fila sandwich, fried chicken Dinner: crawfish, shrimp boil Spinach, broccoli, kale  Exercise: Not much lately, planning on joining a gym  Family History:  Family History  Problem Relation Age of Onset   Heart murmur Brother    CAD Neg Hx    Heart failure Neg Hx     Social History: beer 2-4, not daily, no tobacco, no illicit drugs  Labs: Lab Results  Component Value Date   HGBA1C 5.3 03/15/2023    Wt Readings from Last 1 Encounters:  03/15/23 264 lb 9.6 oz (120 kg)    BP Readings from Last 1 Encounters:  03/15/23 124/86   Pulse Readings from Last 1 Encounters:  03/15/23 82       Component Value Date/Time   CHOL 223 (H) 02/06/2023 1159   TRIG 132 02/06/2023 1159   HDL 90 02/06/2023 1159   CHOLHDL 2.5 02/06/2023 1159   VLDL 26 02/06/2023 1159   LDLCALC 107 (H) 02/06/2023 1159   LDLDIRECT 42.9 04/03/2019 0502    Past Medical History:  Diagnosis  Date   Acute ischemic left MCA stroke (HCC) 04/01/2019   Cerebrovascular accident (CVA) due to embolism of precerebral artery (HCC)    CHF (congestive heart failure) (HCC)    DVT (deep venous thrombosis) (HCC)    Encounter for central line placement    Endocarditis 04/28/2019   Endotracheal tube present    Infective endocarditis    IV drug user    IVDU (intravenous drug user) 04/07/2019   Middle cerebral artery embolism, left 04/01/2019   Normocytic anemia 04/07/2019   Polysubstance abuse (HCC)    Tachypnea    Tobacco use     Current Outpatient Medications on File Prior to Visit  Medication Sig Dispense Refill   aspirin  EC 81 MG tablet Take 1 tablet (81 mg total) by mouth daily.     busPIRone  (BUSPAR ) 7.5 MG tablet TAKE 1 TABLET BY MOUTH TWICE DAILY . APPOINTMENT REQUIRED FOR FUTURE REFILLS 30 tablet 0   carvedilol   (COREG ) 3.125 MG tablet Take 1 tablet (3.125 mg total) by mouth 2 (two) times daily. 60 tablet 1   dapagliflozin  propanediol (FARXIGA ) 10 MG TABS tablet Take 1 tablet (10 mg total) by mouth daily before breakfast. 90 tablet 3   escitalopram  (LEXAPRO ) 10 MG tablet Take 1 tablet (10 mg total) by mouth daily. 90 tablet 1   furosemide  (LASIX ) 40 MG tablet Take 0.5 tablets (20 mg total) by mouth daily. 180 tablet 3   potassium chloride  SA (KLOR-CON  M) 20 MEQ tablet Take 1 tablet by mouth once daily 30 tablet 0   sacubitril -valsartan  (ENTRESTO ) 49-51 MG Take 1 tablet by mouth 2 (two) times daily. 60 tablet 11   SODIUM FLUORIDE 5000 SENSITIVE 1.1-5 % GEL SMARTSIG:Sparingly By Mouth Daily     spironolactone  (ALDACTONE ) 25 MG tablet Take 1 tablet (25 mg total) by mouth daily. 90 tablet 3   No current facility-administered medications on file prior to visit.    No Known Allergies   Assessment/Plan:  1. Weight loss - Patient has not met goal of at least 5% of body weight loss with comprehensive lifestyle modifications alone in the past 3-6 months. Pharmacotherapy is appropriate to pursue as augmentation. Will start Wegovy 0.25 mg weekly. Confirmed patient not pregnant and no personal or family history of medullary thyroid  carcinoma (MTC) or Multiple Endocrine Neoplasia syndrome type 2 (MEN 2). Injection technique reviewed at today's visit.  No personal history of pancreatitis or gallstones.  We did discuss the increased risk of this.  Advised patient on common side effects including nausea, diarrhea, dyspepsia, decreased appetite, and fatigue. Counseled patient on reducing meal size and how to titrate medication to minimize side effects.  Discussed that fried and greasy foods can exacerbate diarrhea.  Counseled patient to call if intolerable side effects or if experiencing dehydration, abdominal pain, or dizziness. Patient will adhere to dietary modifications and will target at least 150 minutes of moderate  intensity exercise weekly.  We talked about how this medication does not distinguish between fat and muscle and therefore for long-term success will need to participate in resistance training.  Encouraged her to increase her vegetable intake and focus on good lean protein intake.  Decreasing fried foods.  Handouts provided  Will submit prior authorization for [facility] under CVA indication  Follow up in 1 month via telephone for tolerability update and dose titration.

## 2023-05-08 NOTE — Patient Instructions (Addendum)
 [facility] home delivery [phone]  GLP-1 Receptor Agonist Counseling Points This medication reduces your appetite and may make you feel fuller longer.  Stop eating when your body tells you that you are full. This will likely happen sooner than you are used to. Fried/greasy food and sweets may upset your stomach - minimize these as much as possible. Store your medication in the fridge until you are ready to use it. Inject your medication in the fatty tissue of your lower abdominal area (2 inches away from belly button) or upper outer thigh. Rotate injection sites. Common side effects include: nausea, diarrhea/constipation, and heartburn, and are more likely to occur if you overeat. Stop your injection for 7 days prior to surgical procedures requiring anesthesia.  Dosing schedule:  We will touch base with you monthly over the phone. The medication can be increased in monthly intervals depending on tolerability and efficacy.  Tips for success: Write down the reasons why you want to lose weight and post it in a place where you'll see it often.  Start small and work your way up. Keep in mind that it takes time to achieve goals, and small steps add up.  Any additional movements help to burn calories. Taking the stairs rather than the elevator and parking at the far end of your parking lot are easy ways to start. Brisk walking for at least 30 minutes 4 or more days of the week is an excellent goal to work toward  Understanding what it means to feel full: Did you know that it can take 15 minutes or more for your brain to receive the message that you've eaten? That means that, if you eat less food, but consume it slower, you may still feel satisfied.  Eating a lot of fruits and vegetables can also help you feel fuller.  Eat off of smaller plates so that moderate portions don't seem too small  Tips for living a healthier life     Building a Healthy and Balanced Diet Make most  of your meal vegetables and fruits -  of your plate. Aim for color and variety, and remember that potatoes don't count as vegetables on the Healthy Eating Plate because of their negative impact on blood sugar.  Go for whole grains -  of your plate. Whole and intact grains--whole wheat, barley, wheat berries, quinoa, oats, brown rice, and foods made with them, such as whole wheat pasta--have a milder effect on blood sugar and insulin  than white bread, white rice, and other refined grains.  Protein power -  of your plate. Fish, poultry, beans, and nuts are all healthy, versatile protein sources--they can be mixed into salads, and pair well with vegetables on a plate. Limit red meat, and avoid processed meats such as bacon and sausage.  Healthy plant oils - in moderation. Choose healthy vegetable oils like olive, canola, soy, corn, sunflower, peanut, and others, and avoid partially hydrogenated oils, which contain unhealthy trans fats. Remember that low-fat does not mean "healthy."  Drink water, coffee, or tea. Skip sugary drinks, limit milk and dairy products to one to two servings per day, and limit juice to a small glass per day.  Stay active. The red figure running across the Healthy Eating Plate's placemat is a reminder that staying active is also important in weight control.  The main message of the Healthy Eating Plate is to focus on diet quality:  The type of carbohydrate in the diet is more important than the amount of  carbohydrate in the diet, because some sources of carbohydrate--like vegetables (other than potatoes), fruits, whole grains, and beans--are healthier than others. The Healthy Eating Plate also advises consumers to avoid sugary beverages, a major source of calories--usually with little nutritional value--in the American diet. The Healthy Eating Plate encourages consumers to use healthy oils, and it does not set a maximum on the percentage of calories people should get  each day from healthy sources of fat. In this way, the Healthy Eating Plate recommends the opposite of the low-fat message promoted for decades by the USDA.  [employer]  SUGAR  Sugar is a huge problem in the modern day diet. Sugar is a big contributor to heart disease, diabetes, high triglyceride levels, fatty liver disease and obesity. Sugar is hidden in almost all packaged foods/beverages. Added sugar is extra sugar that is added beyond what is naturally found and has no nutritional benefit for your body. The American Heart Association recommends limiting added sugars to no more than 25g for women and 36 grams for men per day. There are many names for sugar including maltose, sucrose (names ending in "ose"), high fructose corn syrup, molasses, cane sugar, corn sweetener, raw sugar, syrup, honey or fruit juice concentrate.   One of the best ways to limit your added sugars is to stop drinking sweetened beverages such as soda, sweet tea, and fruit juice.  There is 65g of added sugars in one 20oz bottle of Coke! That is equal to 7.5 donuts.   Pay attention and read all nutrition facts labels. Below is an examples of a nutrition facts label. The #1 is showing you the total sugars where the # 2 is showing you the added sugars. This one serving has almost the max amount of added sugars per day!   EXERCISE  Exercise is good. We've all heard that. In an ideal world, we would all have time and resources to get plenty of it. When you are active, your heart pumps more efficiently and you will feel better.  Multiple studies show that even walking regularly has benefits that include living a longer life. The American Heart Association recommends 150 minutes per week of exercise (30 minutes per day most days of the week). You can do this in any increment you wish. Nine or more 10-minute walks count. So does an hour-long exercise class. Break the time apart  into what will work in your life. Some of the best things you can do include walking briskly, jogging, cycling or swimming laps. Not everyone is ready to "exercise." Sometimes we need to start with just getting active. Here are some easy ways to be more active throughout the day:  Take the stairs instead of the elevator  Go for a 10-15 minute walk during your lunch break (find a friend to make it more enjoyable)  When shopping, park at the back of the parking lot  If you take public transportation, get off one stop early and walk the extra distance  Pace around while making phone calls  Check with your doctor if you aren't sure what your limitations may be. Always remember to drink plenty of water when doing any type of exercise. Don't feel like a failure if you're not getting the 90-150 minutes per week. If you started by being a couch potato, then just a 10-minute walk each day is a huge improvement. Start with little victories and work your way up.   HEALTHY EATING TIPS  Plan ahead: make a menu of the meals for a week then create a grocery list to go with that menu. Consider meals that easily stretch into a night of leftovers, such as stews or casseroles. Or consider making two of your favorite meal and put one in the freezer for another night. Try a night or two each week that is "meatless" or "no cook" such as salads. When you get home from the grocery store wash and prepare your vegetables and fruits. Then when you need them they are ready to go.   Tips for going to the grocery store:  Buy store or generic brands  Check the weekly ad from your store on-line or in their in-store flyer  Look at the unit price on the shelf tag to compare/contrast the costs of different items  Buy fruits/vegetables in season  Carrots, bananas and apples are low-cost, naturally healthy items  If meats or frozen vegetables are on sale, buy some extras and put in your freezer  Limit buying prepared or  "ready to eat" items, even if they are pre-made salads or fruit snacks  Do not shop when you're hungry  Foods at eye level tend to be more expensive. Look on the high and low shelves for deals.  Consider shopping at the farmer's market for fresh foods in season.  Avoid the cookie and chip aisles (these are expensive, high in calories and low in nutritional value). Shop on the outside of the grocery store.  Healthy food preparations:  If you can't get lean hamburger, be sure to drain the fat when cooking  Steam, saut (in olive oil), grill or bake foods  Experiment with different seasonings to avoid adding salt to your foods. Kosher salt, sea salt and Himalayan salt are all still salt and should be avoided. Try seasoning food with onion, garlic, thyme, rosemary, basil ect. Onion powder or garlic powder is ok. Avoid if it says salt (ie garlic salt).

## 2023-05-08 NOTE — Telephone Encounter (Signed)
-----   Message from Benancio Bracket sent at 05/08/2023 12:02 PM EDT ----- Please do PA for [facility] under CVA indication

## 2023-05-08 NOTE — Telephone Encounter (Signed)
 Pharmacy Patient Advocate Encounter   Received notification from Physician's Office that prior authorization for [facility] is required/requested.   Insurance verification completed.   The patient is insured through [facility] .   Per test claim: PA required; PA submitted to above mentioned insurance via CoverMyMeds Key/confirmation #/EOC [id number] Status is pending

## 2023-05-08 NOTE — Telephone Encounter (Signed)
 Pharmacy Patient Advocate Encounter  Received notification from [facility] that Prior Authorization for [facility] .25MG /.5ML has been APPROVED from 05/08/23 to 11/07/23. Ran test claim, Copay is $4. This test claim was processed through [facility]- copay amounts may vary at other pharmacies due to pharmacy/plan contracts, or as the patient moves through the different stages of their insurance plan.

## 2023-05-10 ENCOUNTER — Other Ambulatory Visit (HOSPITAL_COMMUNITY): Payer: Self-pay

## 2023-05-10 MED ORDER — WEGOVY 0.25 MG/0.5ML ~~LOC~~ SOAJ
0.2500 mg | SUBCUTANEOUS | 0 refills | Status: DC
  Filled 2023-05-10 – 2023-05-13 (×2): qty 2, 28d supply, fill #0

## 2023-05-10 NOTE — Addendum Note (Signed)
 Addended by: Lamonica Trueba D on: 05/10/2023 09:58 AM   Modules accepted: Orders

## 2023-05-10 NOTE — Telephone Encounter (Signed)
 I left generic message that Rx was approved and sent to the pharmacy- contact pharmacy to set up payment. Will call again on Monday

## 2023-05-13 ENCOUNTER — Other Ambulatory Visit: Payer: Self-pay

## 2023-05-13 ENCOUNTER — Encounter: Payer: Self-pay | Admitting: Pharmacist

## 2023-05-13 NOTE — Telephone Encounter (Signed)
 I spoke with patient. She was made aware of approval. She will call [facility] to set up deliver/,payment. I will f/u with her in 3-4 weeks

## 2023-05-14 ENCOUNTER — Other Ambulatory Visit: Payer: Self-pay

## 2023-06-06 ENCOUNTER — Telehealth: Payer: Self-pay | Admitting: Pharmacist

## 2023-06-06 ENCOUNTER — Other Ambulatory Visit (HOSPITAL_COMMUNITY): Payer: Self-pay

## 2023-06-06 MED ORDER — WEGOVY 0.5 MG/0.5ML ~~LOC~~ SOAJ
0.5000 mg | SUBCUTANEOUS | 0 refills | Status: DC
  Filled 2023-06-06: qty 2, 28d supply, fill #0

## 2023-06-06 NOTE — Telephone Encounter (Signed)
 Spoke with patient.  States she is doing well on Wegovy  0.25.  Has helped curb her appetite and she is not binge eating which she likes.  She is ready to increase to 0.5.  New prescription sent to [facility].  Will follow-up in a few weeks.

## 2023-06-13 ENCOUNTER — Telehealth (HOSPITAL_COMMUNITY): Payer: Self-pay

## 2023-06-13 NOTE — Telephone Encounter (Signed)
 Called to confirm/remind patient of their appointment at the [facility] on 06/14/23.   Appointment:   [] Confirmed  [x] Left mess   [] No answer/No voice mail  [] VM Full/unable to leave message  [] Phone not in service  And to bring in all medications and/or complete list.

## 2023-06-14 ENCOUNTER — Ambulatory Visit (HOSPITAL_COMMUNITY)
Admission: RE | Admit: 2023-06-14 | Discharge: 2023-06-14 | Disposition: A | Discharge status: Home / Self Care | Source: Ambulatory Visit | Attending: Family Medicine | Admitting: Family Medicine

## 2023-06-14 ENCOUNTER — Telehealth (HOSPITAL_COMMUNITY): Payer: Self-pay | Admitting: Cardiology

## 2023-06-14 ENCOUNTER — Encounter (HOSPITAL_COMMUNITY): Payer: Self-pay

## 2023-06-14 ENCOUNTER — Ambulatory Visit (HOSPITAL_COMMUNITY): Payer: Self-pay | Admitting: Family Medicine

## 2023-06-14 VITALS — BP 126/74 | HR 87 | Ht 67.0 in | Wt 248.2 lb

## 2023-06-14 DIAGNOSIS — T82857D Stenosis of cardiac prosthetic devices, implants and grafts, subsequent encounter: Secondary | ICD-10-CM | POA: Diagnosis not present

## 2023-06-14 DIAGNOSIS — I6310 Cerebral infarction due to embolism of unspecified precerebral artery: Secondary | ICD-10-CM

## 2023-06-14 DIAGNOSIS — Z87891 Personal history of nicotine dependence: Secondary | ICD-10-CM | POA: Insufficient documentation

## 2023-06-14 DIAGNOSIS — Z79899 Other long term (current) drug therapy: Secondary | ICD-10-CM | POA: Insufficient documentation

## 2023-06-14 DIAGNOSIS — F1911 Other psychoactive substance abuse, in remission: Secondary | ICD-10-CM

## 2023-06-14 DIAGNOSIS — E669 Obesity, unspecified: Secondary | ICD-10-CM | POA: Diagnosis not present

## 2023-06-14 DIAGNOSIS — Z8673 Personal history of transient ischemic attack (TIA), and cerebral infarction without residual deficits: Secondary | ICD-10-CM | POA: Insufficient documentation

## 2023-06-14 DIAGNOSIS — Z86718 Personal history of other venous thrombosis and embolism: Secondary | ICD-10-CM | POA: Insufficient documentation

## 2023-06-14 DIAGNOSIS — Z95828 Presence of other vascular implants and grafts: Secondary | ICD-10-CM

## 2023-06-14 DIAGNOSIS — I5022 Chronic systolic (congestive) heart failure: Secondary | ICD-10-CM | POA: Diagnosis present

## 2023-06-14 DIAGNOSIS — Z6838 Body mass index (BMI) 38.0-38.9, adult: Secondary | ICD-10-CM | POA: Diagnosis not present

## 2023-06-14 DIAGNOSIS — Z953 Presence of xenogenic heart valve: Secondary | ICD-10-CM | POA: Diagnosis not present

## 2023-06-14 DIAGNOSIS — Z7982 Long term (current) use of aspirin: Secondary | ICD-10-CM | POA: Insufficient documentation

## 2023-06-14 LAB — BASIC METABOLIC PANEL WITH GFR
Anion gap: 9 (ref 5–15)
BUN: 8 mg/dL (ref 6–20)
CO2: 25 mmol/L (ref 22–32)
Calcium: 9.6 mg/dL (ref 8.9–10.3)
Chloride: 109 mmol/L (ref 98–111)
Creatinine, Ser: 0.78 mg/dL (ref 0.44–1.00)
GFR, Estimated: >60 mL/min (ref >60)
Glucose, Bld: 83 mg/dL (ref 70–99)
Potassium: 4.2 mmol/L (ref 3.5–5.1)
Sodium: 143 mmol/L (ref 135–145)

## 2023-06-14 LAB — BRAIN NATRIURETIC PEPTIDE: B Natriuretic Peptide: 29.5 pg/mL (ref 0.0–100.0)

## 2023-06-14 NOTE — Patient Instructions (Signed)
 Great to see you today!!!  Medication Changes:  None, continue current medications  Lab Work:  Labs done today, your results will be available in MyChart, we will contact you for abnormal readings.   Testing/Procedures:  Your physician has requested that you have an echocardiogram. Echocardiography is a painless test that uses sound waves to create images of your heart. It provides your doctor with information about the size and shape of your heart and how well your heart's chambers and valves are working. This procedure takes approximately one hour. There are no restrictions for this procedure. Please do NOT wear cologne, perfume, aftershave, or lotions (deodorant is allowed). Please arrive 15 minutes prior to your appointment time. IN 6 MONTHS  Please note: We ask at that you not bring children with you during ultrasound (echo/ vascular) testing. Due to room size and safety concerns, children are not allowed in the ultrasound rooms during exams. Our front office staff cannot provide observation of children in our lobby area while testing is being conducted. An adult accompanying a patient to their appointment will only be allowed in the ultrasound room at the discretion of the ultrasound technician under special circumstances. We apologize for any inconvenience.   Referrals:  You have been referred to Interventional Radiology to get your IVC filter removed, they will call you  Special Instructions // Education:  Do the following things EVERYDAY: Weigh yourself in the morning before breakfast. Write it down and keep it in a log. Take your medicines as prescribed Eat low salt foods--Limit salt (sodium) to 2000 mg per day.  Stay as active as you can everyday Limit all fluids for the day to less than 2 liters   Follow-Up in: 6 months with an echocardiogram (December), **PLEASE CALL OUR OFFICE IN SEPTEMBER TO SCHEDULE THESE APPOINTMENTS   At the [facility], you  and your health needs are our priority. We have a designated team specialized in the treatment of Heart Failure. This Care Team includes your primary Heart Failure Specialized Cardiologist (physician), Advanced Practice Providers (APPs- Physician Assistants and Nurse Practitioners), and Pharmacist who all work together to provide you with the care you need, when you need it.   You may see any of the following providers on your designated Care Team at your next follow up:  Dr. Jules Oar Dr. Peder Bourdon Dr. Alwin Baars Dr. Judyth Nunnery Nieves Bars, NP Ruddy Corral, [place] [facility] [place], [place] Dennise Fitz, NP [place] Lee, NP Luster Salters, PharmD   Please be sure to bring in all your medications bottles to every appointment.   Need to Contact Us :  If you have any questions or concerns before your next appointment please send us  a message through [place] or call our office at [phone].    TO LEAVE A MESSAGE FOR THE NURSE SELECT OPTION 2, PLEASE LEAVE A MESSAGE INCLUDING: YOUR NAME DATE OF BIRTH CALL BACK NUMBER REASON FOR CALL**this is important as we prioritize the call backs  YOU WILL RECEIVE A CALL BACK THE SAME DAY AS LONG AS YOU CALL BEFORE 4:00 PM

## 2023-06-14 NOTE — Telephone Encounter (Signed)
 Heart failure clinic called to schedule IVC filter removal for patient. I gave this information to Lesli Rasmussen at the GI clinic, as these patients are seen in clinic prior to scheduling removals of IVC filters. She will call patient and set that appt up. Will schedule removal after that. JM

## 2023-06-14 NOTE — Progress Notes (Signed)
 PCP: Darlis Eisenmenger, MD Cardiology: Dr. Micael Adas HF Cardiology: Dr. Mitzie Anda  33 y.o. 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  Patient was admitted to [facility] in 3/21 with left MCA CVA. She had revascularization of the MCA by IR, procedure complicated by left carotid dissection treated with a stent. TEE showed endocarditis => large tricuspid regurgitation with mild to moderate TR, small MV vegetation with mild MR, and aortic valve vegetation (bicuspid valve) with severe AI.  Blood cultures remained negative, she was treated with empiric abx. She had septic emboli to the brain, kidneys, spleen, and lungs.  On 04/28/19, she had bioprosthetic AVR and debridement + repair of the tricuspid valve. She additionally had a right CFV DVT.  As she was not able to be anticoagulated with septic emboli to the brain as well as the carotid dissection, she had an IVC filter placed.  After a prolonged course, she was discharged in 6/21.   Echo 9/21: no vegetations but EF was down to 20-25% with severely decreased RV systolic function, moderate MR, moderate TR s/p TV repair, bioprosthetic AoV functioning normally.  Of note, intra-op TEE at time of valve surgery showed normal EF. She did not have a post-op echo until 9/21.   In 9/21 RHC/LHC w/ no CAD, markedly elevated filling pressures and low cardiac output.  There was a fistulous connection from the aortic root to the left ventricle.  She was admitted and started on milrinone  and diuresed.  TEE: EF 25%, moderately decreased RV systolic function, bioprosthetic valve with mean gradient 15 mmHg, serpiginous perivalvular leakage from aortic root to LV, appeared moderate in severity.  Cardiac MRI showed LV EF 18%, RV EF 21%, nonspecific RV insertion site LGE.   TEE: 2/22, EF 45%, mild  LV enlargement, normla RV, s/p TV repair with mild TR, bioprosthetic aortic valve with moderate serpiginous peri-valvular leakage from aortic root to LV, mean aortic valve gradient 47 mmHg with DI 0.26 and AVA 0.6 cm^2.  Echo 01/25 EF 60-65%, normal RV size/systolic function, trivial MR, s/p TV repair with mild TR, s/p bioprosthetic aortic valve with mean gradient 44 mmHg and AVA 0.75 cm^2, cannot see perivalvular leakage; IVC normal.   Reestablished care with HF clinic in 01/25. She was volume overloaded and had been off all of her HF medications. Diuretics and GDMT with spironolactone , entresto , carvedilol , and Farxgia restarted.   Today she returns for HF follow up. Overall feeling fine. Works at [employer], averages 14k steps/days. Started on GLP1 and losing weight, feeling well. Denies increasing SOB, palpitations, abnormal bleeding, CP, dizziness, edema, or PND/Orthopnea. Appetite ok. Taking all medications. Contracepting with Nexplanon, though planning to switch to different method due to breath thru bleeding. Family lives in [place], just returned from a mission trip.  ECG: none ordered today.  Labs (1/25): LDL 107 Labs (4/25): K 4.1, creatinine 0.70   PMH: 1. Polysubstance abuse: Cocaine, heroin, methamphetamines.  None since 3/21. 2. Prior smoker: Stopped 3/21.  3. CVA: 3/21, septic embolus.  She had MCA revascularization by IR.  - Left ICA dissection as complication of procedure requiring stent placement.  4. Endocarditis: 3/21.  Culture negative.  She had septic emboli to brain, kidney, spleen and lungs.  - TEE (3/21): EF 55-60%, normal RV, small MV vegetation with mild MR, large TV vegetation with mild-moderate TR, bicuspid aortic valve with aortic valve  endocarditis and severe AI.  - 04/28/19 she had bioprosthetic AVR and debridement + repair of tricuspid valve.  5. DVT (3/21): IVC filter => not candidate for anticoagulation given septic emboli, dissection of carotid with IR procedure  initially.  6. Chronic systolic CHF: Echo in 9/21 showed EF 20-25%, global hypokinesis, severely decreased RV systolic function, moderate MR, moderate TR s/p TV repair, bioprosthetic aortic valve with mean gradient 10 mmHg and no regurgitation; no vegetation noted. Most recent prior echo was the TEE done at the time of surgery in 4/21, EF was normal then.  - LHC/RHC (9/21): No signifcant CAD; mean RA 14, PA 56/26, mean PCWP 28, CI 1.62, PVR 3.7.  Fistulous connection aortic root to LV.  - TEE (9/21): EF 25%, moderately decreased RV systolic function, bioprosthetic aortic valve with mean gradient 15 mmHg, serpiginous perivalvular leakage, moderate in severity.  - Cardiac MRI: Nonspecific RV insertion site LGE. LV EF 18%, RV EF 21%.  - Echo (2/22): EF 40-45% with mild LV dilation, mild MR, s/p TV repair, s/p bioprosthetic AVR with mean gradient markedly higher than in the past at 47 mmHg with trivial AI.   - TEE (2/22): EF 45%, mild LV enlargement, normla RV, s/p TV repair with mild TR, bioprosthetic aortic valve with moderate serpiginous peri-valvular leakage from aortic root to LV, mean aortic valve gradient 47 mmHg with DI 0.26 and AVA 0.6 cm^2.  - Echo (1/25): EF 60-65%, normal RV size/systolic function, trivial MR, s/p TV repair with mild TR, s/p bioprosthetic aortic valve with mean gradient 44 mmHg and AVA 0.75 cm^2, cannot see perivalvular leakage; IVC normal.  7. Fistulous connection aortic root to LV, moderate leakage on 9/21 TEE and 2/22 TEE.    Social History   Socioeconomic History   Marital status: Single    Spouse name: Not on file   Number of children: Not on file   Years of education: Not on file   Highest education level: Not on file  Occupational History   Not on file  Tobacco Use   Smoking status: Former    Current packs/day: 0.00    Types: Cigarettes    Quit date: 03/10/2019    Years since quitting: 4.2   Smokeless tobacco: Never  Vaping Use   Vaping status: Unknown   Substance and Sexual Activity   Alcohol use: Not Currently   Drug use: Not Currently   Sexual activity: Not on file  Other Topics Concern   Not on file  Social History Narrative   Not on file   Social Drivers of Health   Financial Resource Strain: Not on file  Food Insecurity: Not on file  Transportation Needs: Not on file  Physical Activity: Not on file  Stress: Not on file  Social Connections: Unknown (05/22/2021)   Received from [facility], [facility]   Social Network    Social Network: Not on file  Intimate Partner Violence: Unknown (04/13/2021)   Received from [facility], [facility]   HITS    Physically Hurt: Not on file    Insult or Talk Down To: Not on file    Threaten Physical Harm: Not on file    Scream or Curse: Not on file   Family History  Problem Relation Age of Onset   Heart murmur Brother    CAD Neg Hx    Heart failure Neg Hx    ROS: All systems reviewed and negative except as per HPI.   Current Outpatient Medications  Medication  Sig Dispense Refill   aspirin  EC 81 MG tablet Take 1 tablet (81 mg total) by mouth daily.     busPIRone  (BUSPAR ) 7.5 MG tablet TAKE 1 TABLET BY MOUTH TWICE DAILY . APPOINTMENT REQUIRED FOR FUTURE REFILLS 30 tablet 0   carvedilol  (COREG ) 3.125 MG tablet Take 1 tablet (3.125 mg total) by mouth 2 (two) times daily. 60 tablet 1   dapagliflozin  propanediol (FARXIGA ) 10 MG TABS tablet Take 1 tablet (10 mg total) by mouth daily before breakfast. 90 tablet 3   escitalopram  (LEXAPRO ) 10 MG tablet Take 1 tablet (10 mg total) by mouth daily. 90 tablet 1   furosemide  (LASIX ) 40 MG tablet Take 0.5 tablets (20 mg total) by mouth daily. 180 tablet 3   sacubitril -valsartan  (ENTRESTO ) 49-51 MG Take 1 tablet by mouth 2 (two) times daily. 60 tablet 11   Semaglutide -Weight Management (WEGOVY ) 0.5 MG/0.5ML SOAJ Inject 0.5 mg into the skin once a week. 2 mL 0   SODIUM FLUORIDE 5000 SENSITIVE 1.1-5 % GEL SMARTSIG:Sparingly By Mouth Daily      spironolactone  (ALDACTONE ) 25 MG tablet Take 1 tablet (25 mg total) by mouth daily. 90 tablet 3   potassium chloride  SA (KLOR-CON  M) 20 MEQ tablet Take 1 tablet by mouth once daily (Patient not taking: Reported on 06/14/2023) 30 tablet 0   No current facility-administered medications for this encounter.   BP 126/74   Pulse 87   Ht 5\' 7"  (1.702 m)   Wt 112.6 kg (248 lb 3.2 oz)   SpO2 98%   BMI 38.87 kg/m    Wt Readings from Last 3 Encounters:  06/14/23 112.6 kg (248 lb 3.2 oz)  05/08/23 122 kg (269 lb)  03/15/23 120 kg (264 lb 9.6 oz)   Physical Exam General:  NAD. No resp difficulty, walked into clinic HEENT: Normal Neck: Supple. No JVD. Cor: Regular rate & rhythm. No rubs, gallops or murmurs. Lungs: Clear Abdomen: Soft, obese, nontender, nondistended.  Extremities: No cyanosis, clubbing, rash, edema Neuro: Alert & oriented x 3, moves all 4 extremities w/o difficulty. Affect pleasant.  Assessment/Plan: 1. Chronic systolic => diastolic CHF: 9/21 echo showed no vegetations but EF was down to 20-25% with severely decreased RV systolic function, moderate MR, moderate TR s/p TV repair, bioprosthetic AoV functioning normally (but there is evidence of peri-valvular leakage).   - Intra-op TEE at time of surgery: normal EF. She did not have a post-op echo until 9/21.  Cause of biventricular failure is uncertain, ?viral myocarditis ("cold" in 8/21), ?volume load from aortic root => LV fistula.  - RHC in 9/21 showed significantly elevated PCWP and RA pressure, cardiac index was low at 1.6.   - Cardiac MRI nonspecific RV insertion site LGE.  - Echo 2/22 EF 40-45% but concerning for severe bioprosthetic stenosis (?bioprosthetic valve thrombosis).  - Echo 1/25 EF 60-65%, normal RV size/systolic function, trivial MR, s/p TV repair with mild TR, s/p bioprosthetic aortic valve with mean gradient 44 mmHg and AVA 0.75 cm^2, cannot see perivalvular leakage; IVC normal.  NYHA I, she is not overloaded by  exam. - Continue Lasix  20 mg daily. BMET and BNP today. - Continue Coreg  3.125 mg bid.  - Continue Entresto  49/51 mg bid - Continue spironolactone  25 mg daily.  - Continue dapagliflozin  10 mg daily.  No GU symptoms. - Avoid pregnancy with HF medications, on Nexplanon. - Repeat echo 01/2024. 2. H/o endocarditis: Culture negative.  S/p bioprosthetic aortic valve replacement and TV repair. 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 TEE in 2/22 did not appear to show active vegetation.  3. Bicuspid aortic valve disorder: Now s/p bioprosthetic AVR.  Thoracic aorta not dilated on 3/21 CT.  4. H/o R CFV DVT: Has IVC filter. I think this needs to be removed.  - Referred to IR to consider removing IVC filter last visit. Will re-refer.  5. H/o CVA: 3/21, suspect septic embolization. Had left MCA revascularization by IR, complicated by left ICA dissection and stent placement.  - Continue ASA 81 mg daily - Carotid dopplers have been arranged. 6. Polysubstance abuse: No drugs or smoking since 3/21. Does consume ETOH. 7. 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  Suspect this is a sequelae of prior endocarditis.  TEE in 9/21 and again in 2/22 showed aortic root to LV peri-valvular leakage with a serpiginous course, peri-valvular leakage appears no more than moderate in severity.  This is not well visualized on TTEs and is not seen on study 01/25. - Have discussed with Dr. Arlester Ladd with the structural heart service, will follow for now with serial echoes.  8. Bioprosthetic valve stenosis:  TEE in 2/22 showed EF 45%, mild LV enlargement, normla RV, s/p TV repair with mild TR, bioprosthetic aortic valve with moderate serpiginous peri-valvular leakage from aortic root to LV, mean aortic valve gradient 47 mmHg with DI 0.26 and AVA 0.6 cm^2.  The aortic valve did not show significant degeneration and there was no partial thrombosis noted. Suspect  that the elevated gradient is due to small valve with patient-prosthesis mismatch as well as high flow from moderate aortic insufficiency. Echo 01/25 similar mean gradient 44 mmHg and AVA 0.75 cm^2.  - She has no exertional symptoms.  - No plan for intervention at this point but will need to follow valve closely.  - She will need antibiotic prophylaxis with dental work.  - Plan repeat echo in 1/26 9. Obesity: Body mass index is 38.87 kg/m. - She is now on GLP1, losing weight.  Follow up in 6 months with Dr. Mitzie Anda + echo  Arlice Bene [facility] FNP-BC 06/14/2023

## 2023-06-18 ENCOUNTER — Other Ambulatory Visit: Payer: Self-pay | Admitting: Interventional Radiology

## 2023-06-18 DIAGNOSIS — Z95828 Presence of other vascular implants and grafts: Secondary | ICD-10-CM

## 2023-07-05 ENCOUNTER — Other Ambulatory Visit (HOSPITAL_COMMUNITY): Payer: Self-pay

## 2023-07-05 ENCOUNTER — Telehealth: Payer: Self-pay | Admitting: Pharmacist

## 2023-07-05 MED ORDER — WEGOVY 1 MG/0.5ML ~~LOC~~ SOAJ
1.0000 mg | SUBCUTANEOUS | 0 refills | Status: DC
  Filled 2023-07-05 – 2023-07-15 (×2): qty 2, 28d supply, fill #0

## 2023-07-05 NOTE — Telephone Encounter (Signed)
 Spoke to patient. Doing well on Wegovy . Would like to increase dose. Rx sent to [facility]

## 2023-07-08 ENCOUNTER — Other Ambulatory Visit: Payer: Self-pay

## 2023-07-08 ENCOUNTER — Encounter: Payer: Self-pay | Admitting: Pharmacist

## 2023-07-11 ENCOUNTER — Other Ambulatory Visit: Payer: Self-pay

## 2023-07-15 ENCOUNTER — Other Ambulatory Visit: Payer: Self-pay

## 2023-07-15 ENCOUNTER — Other Ambulatory Visit (HOSPITAL_COMMUNITY): Payer: Self-pay

## 2023-07-16 ENCOUNTER — Other Ambulatory Visit: Payer: Self-pay

## 2023-07-17 ENCOUNTER — Other Ambulatory Visit: Payer: Self-pay

## 2023-07-17 ENCOUNTER — Other Ambulatory Visit (HOSPITAL_COMMUNITY): Payer: Self-pay

## 2023-08-05 NOTE — Addendum Note (Signed)
 Addended by: Sophia Sperry D on: 08/05/2023 04:29 PM   Modules accepted: Orders

## 2023-08-05 NOTE — Telephone Encounter (Signed)
 Spoke to patient.  She states she is doing great on Wegovy .  Has lost about 31 pounds.  She is getting a lot of steps and is very active but has not started resistance training yet.  States she does want to cooperate more exercise into her routine.  She is moving shortly and that is the goal of hers after she moves.  She would like to increase to 1.7 mg of Wegovy .  But she is out of town currently and requests that we send the prescription on Thursday.

## 2023-08-08 ENCOUNTER — Other Ambulatory Visit: Payer: Self-pay

## 2023-08-08 ENCOUNTER — Other Ambulatory Visit (HOSPITAL_COMMUNITY): Payer: Self-pay

## 2023-08-08 MED ORDER — WEGOVY 1.7 MG/0.75ML ~~LOC~~ SOAJ
1.7000 mg | SUBCUTANEOUS | 0 refills | Status: DC
  Filled 2023-08-08: qty 3, 28d supply, fill #0

## 2023-08-08 NOTE — Addendum Note (Signed)
 Addended by: Fusaye Wachtel D on: 08/08/2023 08:48 AM   Modules accepted: Orders

## 2023-08-29 ENCOUNTER — Other Ambulatory Visit (HOSPITAL_COMMUNITY): Payer: Self-pay

## 2023-08-29 MED ORDER — WEGOVY 2.4 MG/0.75ML ~~LOC~~ SOAJ
2.4000 mg | SUBCUTANEOUS | 11 refills | Status: DC
  Filled 2023-08-29 – 2023-12-04 (×2): qty 3, 28d supply, fill #0

## 2023-08-29 NOTE — Addendum Note (Signed)
 Addended by: Jakyron Fabro D on: 08/29/2023 04:33 PM   Modules accepted: Orders

## 2023-08-29 NOTE — Telephone Encounter (Signed)
 Patient doing well on Wegovy  1.7 mg.  She is down to 241 pounds and is happy with her progress.  Has not doing much exercise but is moving to a new apartment where she will have access sidewalks and likes to walk in the evenings.  She is ready to increase to 2.4 mg.  Will send Rx to [facility].  She will contact them to give them her new mailing address.

## 2023-11-04 ENCOUNTER — Telehealth: Payer: Self-pay | Admitting: Pharmacy Technician

## 2023-11-04 ENCOUNTER — Other Ambulatory Visit (HOSPITAL_COMMUNITY): Payer: Self-pay

## 2023-11-04 NOTE — Telephone Encounter (Signed)
   Pharmacy Patient Advocate Encounter   Received notification from CoverMyMeds that prior authorization for wegovy  is required/requested.   Insurance verification completed.   The patient is insured through [facility] MEDICAID.   Per test claim: insurance is termed and also patient is on wegovy  2.4mg  now. Sent patient a message to see if she has new insurance.

## 2023-11-05 ENCOUNTER — Other Ambulatory Visit (HOSPITAL_COMMUNITY): Payer: Self-pay

## 2023-11-05 ENCOUNTER — Telehealth: Payer: Self-pay | Admitting: Pharmacy Technician

## 2023-11-05 DIAGNOSIS — I5022 Chronic systolic (congestive) heart failure: Secondary | ICD-10-CM

## 2023-11-05 NOTE — Telephone Encounter (Signed)
 Hi, the patient does not have insurance right now-I was working on her wegovy  prior authorization. I told her that her medications are on our $5/$10 list at [facility] and she wanted to see if her medications can be sent to the [facility] long pharmacy for mail:  Busiprone, carvedilol ,escitalopram , furosemide , potassium, spironolactone . (I dont think that the buspirone  7.5mg  is on the list but she wanted to see how much that one would be as well-the others I confirmed are on the list)   She also would like assistance for farxiga , entresto  and wegovy . I know entresto  is off the list so that one may need to be changed? My notes had there was no wegovy  assistance but then it said online that [facility] had it? Can you help her with these assistance since she sees dr. Mclean?   She is completely out of med. She said she has been out of her heart meds since aug due to the no insurance. She has an appt with medicaid next month and she is hoping to get insurance then.

## 2023-11-05 NOTE — Telephone Encounter (Signed)
 I called and talked to the patient and she doesn't have insurance right now   Sent  a message to stephaine to see if there can be help with assistance

## 2023-11-05 NOTE — Telephone Encounter (Signed)
   I tried to fill out [facility] assistance for the patient and it is registering she has insurance but she does not. She will call and see if she can work it out with them   Unclear if [facility] has assistance for wegovy . Online it says it does but couldn't find it on the sheet

## 2023-11-06 ENCOUNTER — Other Ambulatory Visit (HOSPITAL_COMMUNITY): Payer: Self-pay

## 2023-11-06 NOTE — Telephone Encounter (Signed)
 I have messaged office staff to request new rxs for applicable medications to be sent in, will follow up with patient regarding medication assistance options once rxs are in process.

## 2023-11-07 ENCOUNTER — Other Ambulatory Visit (HOSPITAL_COMMUNITY): Payer: Self-pay

## 2023-11-07 MED ORDER — SPIRONOLACTONE 25 MG PO TABS
25.0000 mg | ORAL_TABLET | Freq: Every day | ORAL | 3 refills | Status: DC
  Filled 2023-11-07: qty 30, 30d supply, fill #0
  Filled 2023-12-04: qty 30, 30d supply, fill #1

## 2023-11-07 MED ORDER — POTASSIUM CHLORIDE CRYS ER 20 MEQ PO TBCR
20.0000 meq | EXTENDED_RELEASE_TABLET | Freq: Every day | ORAL | 3 refills | Status: DC
  Filled 2023-11-07: qty 30, 30d supply, fill #0
  Filled 2023-12-04: qty 30, 30d supply, fill #1

## 2023-11-07 MED ORDER — CARVEDILOL 3.125 MG PO TABS
3.1250 mg | ORAL_TABLET | Freq: Two times a day (BID) | ORAL | 3 refills | Status: DC
  Filled 2023-11-07: qty 60, 30d supply, fill #0
  Filled 2023-12-04: qty 60, 30d supply, fill #1

## 2023-11-07 MED ORDER — FUROSEMIDE 40 MG PO TABS
20.0000 mg | ORAL_TABLET | Freq: Every day | ORAL | 3 refills | Status: DC
  Filled 2023-11-07: qty 15, 30d supply, fill #0
  Filled 2023-12-04: qty 15, 30d supply, fill #1

## 2023-11-07 NOTE — Telephone Encounter (Signed)
 Stef to work on assistance in other encounter

## 2023-11-08 ENCOUNTER — Other Ambulatory Visit (HOSPITAL_COMMUNITY): Payer: Self-pay

## 2023-11-08 ENCOUNTER — Other Ambulatory Visit: Payer: Self-pay

## 2023-11-08 ENCOUNTER — Telehealth (HOSPITAL_COMMUNITY): Payer: Self-pay

## 2023-11-08 NOTE — Telephone Encounter (Signed)
 Advanced Heart Failure Patient Advocate Encounter  Application for Farxiga  faxed to [employer] on 11/08/2023. Application form attached to patient chart.  Rachel DEL, CPhT Rx Patient Advocate Phone: [phone]

## 2023-11-08 NOTE — Telephone Encounter (Signed)
 Spoke to patient by phone, Carvedilol , Furosemide , Potassium, Spironolactone  are filled at [facility] and are ready for pickup. Patient will pick up later today, and has contacted other offices for appropriate refills from the correct providers. Patient is also in contact with [employer] about Wegovy  assistance. Farxiga  assistance started in alternate encounter, I will update patient when a determination is made.

## 2023-11-18 NOTE — Telephone Encounter (Signed)
 Contacted [employer] for status update. Representative stated that based on  electronic verification this patient should be eligible for Medicaid. Spoke to patient by phone, she was previously on Medicaid but was reduced to family planning only. Patient will contact case worker to see if full medicaid can be reinstated, or if she can obtain a copy of the determination letter for family planning to send to [employer]. Patient will contact me with update.

## 2023-12-02 NOTE — Telephone Encounter (Signed)
 Contacted patient by phone for update, patient has an appointment on Wednesday to discuss. Pt will contact me with an update about assistance.

## 2023-12-04 ENCOUNTER — Ambulatory Visit (HOSPITAL_COMMUNITY): Payer: Self-pay | Admitting: Cardiology

## 2023-12-04 ENCOUNTER — Other Ambulatory Visit (HOSPITAL_COMMUNITY): Payer: Self-pay

## 2023-12-04 ENCOUNTER — Other Ambulatory Visit: Payer: Self-pay

## 2023-12-04 ENCOUNTER — Ambulatory Visit (HOSPITAL_BASED_OUTPATIENT_CLINIC_OR_DEPARTMENT_OTHER)
Admission: RE | Admit: 2023-12-04 | Discharge: 2023-12-04 | Disposition: A | Discharge status: Home / Self Care | Payer: Self-pay | Source: Ambulatory Visit | Attending: Family Medicine | Admitting: Family Medicine

## 2023-12-04 ENCOUNTER — Ambulatory Visit (HOSPITAL_COMMUNITY)
Admission: RE | Admit: 2023-12-04 | Discharge: 2023-12-04 | Disposition: A | Discharge status: Home / Self Care | Payer: Self-pay | Source: Ambulatory Visit | Attending: Cardiology | Admitting: Cardiology

## 2023-12-04 ENCOUNTER — Encounter (HOSPITAL_COMMUNITY): Payer: Self-pay | Admitting: Cardiology

## 2023-12-04 VITALS — BP 144/88 | HR 76 | Ht 67.0 in | Wt 249.8 lb

## 2023-12-04 DIAGNOSIS — I340 Nonrheumatic mitral (valve) insufficiency: Secondary | ICD-10-CM | POA: Insufficient documentation

## 2023-12-04 DIAGNOSIS — Z87891 Personal history of nicotine dependence: Secondary | ICD-10-CM | POA: Insufficient documentation

## 2023-12-04 DIAGNOSIS — T383X6A Underdosing of insulin and oral hypoglycemic [antidiabetic] drugs, initial encounter: Secondary | ICD-10-CM | POA: Insufficient documentation

## 2023-12-04 DIAGNOSIS — I5022 Chronic systolic (congestive) heart failure: Secondary | ICD-10-CM | POA: Insufficient documentation

## 2023-12-04 DIAGNOSIS — T8209XA Other mechanical complication of heart valve prosthesis, initial encounter: Secondary | ICD-10-CM | POA: Insufficient documentation

## 2023-12-04 DIAGNOSIS — F1991 Other psychoactive substance use, unspecified, in remission: Secondary | ICD-10-CM | POA: Insufficient documentation

## 2023-12-04 DIAGNOSIS — I5032 Chronic diastolic (congestive) heart failure: Secondary | ICD-10-CM

## 2023-12-04 DIAGNOSIS — E669 Obesity, unspecified: Secondary | ICD-10-CM | POA: Insufficient documentation

## 2023-12-04 DIAGNOSIS — Z91120 Patient's intentional underdosing of medication regimen due to financial hardship: Secondary | ICD-10-CM | POA: Insufficient documentation

## 2023-12-04 DIAGNOSIS — Z5971 Insufficient health insurance coverage: Secondary | ICD-10-CM | POA: Insufficient documentation

## 2023-12-04 DIAGNOSIS — Z8673 Personal history of transient ischemic attack (TIA), and cerebral infarction without residual deficits: Secondary | ICD-10-CM | POA: Insufficient documentation

## 2023-12-04 DIAGNOSIS — T465X6A Underdosing of other antihypertensive drugs, initial encounter: Secondary | ICD-10-CM | POA: Insufficient documentation

## 2023-12-04 DIAGNOSIS — T82857D Stenosis of cardiac prosthetic devices, implants and grafts, subsequent encounter: Secondary | ICD-10-CM

## 2023-12-04 DIAGNOSIS — Z6839 Body mass index (BMI) 39.0-39.9, adult: Secondary | ICD-10-CM | POA: Insufficient documentation

## 2023-12-04 LAB — BASIC METABOLIC PANEL WITH GFR
Anion gap: 7 (ref 5–15)
BUN: 12 mg/dL (ref 6–20)
CO2: 23 mmol/L (ref 22–32)
Calcium: 8.8 mg/dL — ABNORMAL LOW (ref 8.9–10.3)
Chloride: 109 mmol/L (ref 98–111)
Creatinine, Ser: 0.71 mg/dL (ref 0.44–1.00)
GFR, Estimated: >60 mL/min (ref >60)
Glucose, Bld: 91 mg/dL (ref 70–99)
Potassium: 4.3 mmol/L (ref 3.5–5.1)
Sodium: 139 mmol/L (ref 135–145)

## 2023-12-04 LAB — ECHOCARDIOGRAM COMPLETE
AR max vel: 0.87 cm2
AV Area VTI: 0.75 cm2
AV Area mean vel: 0.85 cm2
AV Mean grad: 40 mmHg
AV Peak grad: 67.8 mmHg
Ao pk vel: 4.12 m/s
Area-P 1/2: 4.15 cm2
Calc EF: 60.2 %
S' Lateral: 3 cm
Single Plane A2C EF: 59.9 %
Single Plane A4C EF: 63.6 %

## 2023-12-04 LAB — BRAIN NATRIURETIC PEPTIDE: B Natriuretic Peptide: 86.6 pg/mL (ref 0.0–100.0)

## 2023-12-04 MED ORDER — VALSARTAN 80 MG PO TABS
80.0000 mg | ORAL_TABLET | Freq: Two times a day (BID) | ORAL | 5 refills | Status: DC
  Filled 2023-12-04: qty 60, 30d supply, fill #0

## 2023-12-04 NOTE — Progress Notes (Signed)
  Echocardiogram 2D Echocardiogram has been performed.  Lauren Reyes 12/04/2023, 11:54 AM

## 2023-12-04 NOTE — Patient Instructions (Signed)
 Medication Changes:  STOP ENTRESTO    START VALSARTAN  80MG  TWICE DAILY   RESTART FARXIGA  10MG  ONCE DAILY---SAMPLES GIVEN---PHARMACY TEAM WILL REACH OUT REGARDING PATIENT ASSISTANCE   Lab Work:  Labs done today, your results will be available in MyChart, we will contact you for abnormal readings.  AND THEN LABS AGAIN IN 10 DAYS AS SCHEDULED   Follow-Up in: 2 MONTHS WITH APP CLINIC AS SCHEDULED   At the [facility], you and your health needs are our priority. We have a designated team specialized in the treatment of Heart Failure. This Care Team includes your primary Heart Failure Specialized Cardiologist (physician), Advanced Practice Providers (APPs- Physician Assistants and Nurse Practitioners), and Pharmacist who all work together to provide you with the care you need, when you need it.   You may see any of the following providers on your designated Care Team at your next follow up:  Dr. Toribio Fuel Dr. Ezra Shuck Dr. Odis Brownie Greig Mosses, NP Caffie Shed, [place] [facility], [place] Beckey Coe, NP Jordan Lee, NP Tinnie Redman, PharmD   Please be sure to bring in all your medications bottles to every appointment.   Need to Contact Us :  If you have any questions or concerns before your next appointment please send us  a message through [place] or call our office at [phone].    TO LEAVE A MESSAGE FOR THE NURSE SELECT OPTION 2, PLEASE LEAVE A MESSAGE INCLUDING: YOUR NAME DATE OF BIRTH CALL BACK NUMBER REASON FOR CALL**this is important as we prioritize the call backs  YOU WILL RECEIVE A CALL BACK THE SAME DAY AS LONG AS YOU CALL BEFORE 4:00 PM

## 2023-12-04 NOTE — Progress Notes (Signed)
 Medication Samples have been provided to the patient.  Drug name: FARXIGA        Strength: 10MG         Qty: 4 BOXES  LOT: [id number]  Exp.Date: 07/08/2026  Dosing instructions:TAKE ONE TABLET DAILY.  The patient has been instructed regarding the correct time, dose, and frequency of taking this medication, including desired effects and most common side effects.   Lauren Reyes [place] 12:27 PM 12/04/2023

## 2023-12-08 NOTE — Progress Notes (Signed)
 Cardiology: Dr. Shlomo HF Cardiology: Dr. Rolan  Chief complaint: CHF  33 y.o. 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  Patient was admitted to [facility] in 3/21 with left MCA CVA. She had revascularization of the MCA by IR, procedure complicated by left carotid dissection treated with a stent. TEE showed endocarditis => large tricuspid regurgitation with mild to moderate TR, small MV vegetation with mild MR, and aortic valve vegetation (bicuspid valve) with severe AI.  Blood cultures remained negative, she was treated with empiric abx. She had septic emboli to the brain, kidneys, spleen, and lungs.  On 04/28/19, she had bioprosthetic AVR and debridement + repair of the tricuspid valve. She additionally had a right CFV DVT.  As she was not able to be anticoagulated with septic emboli to the brain as well as the carotid dissection, she had an IVC filter placed.  After a prolonged course, she was discharged in 6/21.   Echo 9/21: no vegetations but EF was down to 20-25% with severely decreased RV systolic function, moderate MR, moderate TR s/p TV repair, bioprosthetic AoV functioning normally.  Of note, intra-op TEE at time of valve surgery showed normal EF. She did not have a post-op echo until 9/21.   In 9/21 RHC/LHC w/ no CAD, markedly elevated filling pressures and low cardiac output.  There was a fistulous connection from the aortic root to the left ventricle.  She was admitted and started on milrinone  and diuresed.  TEE: EF 25%, moderately decreased RV systolic function, bioprosthetic valve with mean gradient 15 mmHg, serpiginous perivalvular leakage from aortic root to LV, appeared moderate in severity.  Cardiac MRI showed LV EF 18%, RV EF 21%, nonspecific RV insertion site LGE.   TEE in 2/22 showed EF 45%,  mild LV enlargement, normla RV, s/p TV repair with mild TR, bioprosthetic aortic valve with moderate serpiginous peri-valvular leakage from aortic root to LV, mean aortic valve gradient 47 mmHg with DI 0.26 and AVA 0.6 cm^2.  Echo 1/25 EF 60-65%, normal RV size/systolic function, trivial MR, s/p TV repair with mild TR, s/p bioprosthetic aortic valve with mean gradient 44 mmHg and AVA 0.75 cm^2, cannot see perivalvular leakage; IVC normal.   Reestablished care with HF clinic in 01/25. She was volume overloaded and had been off all of her HF medications. Diuretics and GDMT with spironolactone , entresto , carvedilol , and Farxgia restarted.   Echo was done today and reviewed, EF 60-65% with normal LV size and normal diastolic function, normal RV size and systolic function, trivial MR, s/p TV repair with trivial TR, bioprosthetic aortic valve with mean gradient 45 mmHg and AVA 0.71 cm^2, there is peri-valvular AI that is poorly visualized but at least mild-moderate, no holodiastolic flow reversal in the descending thoracic aorta.    Today she returns for HF follow up. She has been off Entresto , Farxiga , and semaglutide  since she lost her Medicaid 3 months ago.  BP is elevated. Symptomatically, she feels good.  No dyspnea walking up to her 3rd floor apartment.  No dyspnea with her usual activities.  No particular fatigue.  No chest pain.  No lightheadedness, palpitations, syncope. She is working full time as as marketing executive at [employer].   ECG: NSR, normal  Labs (1/25): LDL 107 Labs (4/25): K 4.1, creatinine 0.70 Labs (6/25): BNP 29.5, K 4.2, creatinine 0.78   PMH: 1. Polysubstance  abuse: Cocaine, heroin, methamphetamines.  None since 3/21. 2. Prior smoker: Stopped 3/21.  3. CVA: 3/21, septic embolus.  She had MCA revascularization by IR.  - Left ICA dissection as complication of procedure requiring stent placement.  4. Endocarditis: 3/21.  Culture negative.  She had septic emboli to brain, kidney,  spleen and lungs.   - TEE (3/21): EF 55-60%, normal RV, small MV vegetation with mild MR, large TV vegetation with mild-moderate TR, bicuspid aortic valve with aortic valve endocarditis and severe AI.  - 04/28/19 she had bioprosthetic AVR and debridement + repair of tricuspid valve.  5. DVT (3/21): IVC filter => not candidate for anticoagulation given septic emboli, dissection of carotid with IR procedure initially.  6. Chronic systolic CHF: Echo in 9/21 showed EF 20-25%, global hypokinesis, severely decreased RV systolic function, moderate MR, moderate TR s/p TV repair, bioprosthetic aortic valve with mean gradient 10 mmHg and no regurgitation; no vegetation noted. Most recent prior echo was the TEE done at the time of surgery in 4/21, EF was normal then.  - LHC/RHC (9/21): No signifcant CAD; mean RA 14, PA 56/26, mean PCWP 28, CI 1.62, PVR 3.7.  Fistulous connection aortic root to LV.  - TEE (9/21): EF 25%, moderately decreased RV systolic function, bioprosthetic aortic valve with mean gradient 15 mmHg, serpiginous perivalvular leakage, moderate in severity.  - Cardiac MRI: Nonspecific RV insertion site LGE. LV EF 18%, RV EF 21%.  - Echo (2/22): EF 40-45% with mild LV dilation, mild MR, s/p TV repair, s/p bioprosthetic AVR with mean gradient markedly higher than in the past at 47 mmHg with trivial AI, no holodiastolic flow reversal in the descending thoracic aorta.   - TEE (2/22): EF 45%, mild LV enlargement, normla RV, s/p TV repair with mild TR, bioprosthetic aortic valve with moderate serpiginous peri-valvular leakage from aortic root to LV, mean aortic valve gradient 47 mmHg with DI 0.26 and AVA 0.6 cm^2.  - Echo (1/25): EF 60-65%, normal RV size/systolic function, trivial MR, s/p TV repair with mild TR, s/p bioprosthetic aortic valve with mean gradient 44 mmHg and AVA 0.75 cm^2, cannot see perivalvular leakage; IVC normal.  - Echo (11/25): EF 60-65% with normal LV size and normal diastolic function,  normal RV size and systolic function, trivial MR, s/p TV repair with trivial TR, bioprosthetic aortic valve with mean gradient 45 mmHg and AVA 0.71 cm^2, there is peri-valvular AI that is poorly visualized but at least mild-moderate.  7. Fistulous connection aortic root to LV, moderate leakage on 9/21 TEE and 2/22 TEE.    Social History   Socioeconomic History   Marital status: Single    Spouse name: Not on file   Number of children: Not on file   Years of education: Not on file   Highest education level: Not on file  Occupational History   Not on file  Tobacco Use   Smoking status: Former    Current packs/day: 0.00    Types: Cigarettes    Quit date: 03/10/2019    Years since quitting: 4.7   Smokeless tobacco: Never  Vaping Use   Vaping status: Unknown  Substance and Sexual Activity   Alcohol use: Not Currently   Drug use: Not Currently   Sexual activity: Not on file  Other Topics Concern   Not on file  Social History Narrative   Not on file   Social Drivers of Health   Financial Resource Strain: Not on file  Food Insecurity: Not on file  Transportation Needs: Not on file  Physical Activity: Not on file  Stress: Not on file  Social Connections: Unknown (05/22/2021)   Received [facility]   Social Network    Social Network: Not on file  Intimate Partner Violence: Not At Risk (10/28/2023)   Received from [facility]   Humiliation, Afraid, Rape, and Kick questionnaire    Within the last year, have you been afraid of your partner or ex-partner?: No    Within the last year, have you been humiliated or emotionally abused in other ways by your partner or ex-partner?: No    Within the last year, have you been kicked, hit, slapped, or otherwise physically hurt by your partner or ex-partner?: No    Within the last year, have you been raped or forced to have any kind of sexual activity by your partner or ex-partner?: No   Family History  Problem Relation Age of Onset    Heart murmur Brother    CAD Neg Hx    Heart failure Neg Hx    ROS: All systems reviewed and negative except as per HPI.   Current Outpatient Medications  Medication Sig Dispense Refill   aspirin  EC 81 MG tablet Take 1 tablet (81 mg total) by mouth daily.     busPIRone  (BUSPAR ) 7.5 MG tablet TAKE 1 TABLET BY MOUTH TWICE DAILY . APPOINTMENT REQUIRED FOR FUTURE REFILLS 30 tablet 0   carvedilol  (COREG ) 3.125 MG tablet Take 1 tablet (3.125 mg total) by mouth 2 (two) times daily. 60 tablet 3   dapagliflozin  propanediol (FARXIGA ) 10 MG TABS tablet Take 1 tablet (10 mg total) by mouth daily before breakfast. 90 tablet 3   escitalopram  (LEXAPRO ) 10 MG tablet Take 1 tablet (10 mg total) by mouth daily. 90 tablet 1   furosemide  (LASIX ) 40 MG tablet Take 1/2 tablet (20 mg total) by mouth daily. 15 tablet 3   potassium chloride  SA (KLOR-CON  M) 20 MEQ tablet Take 1 tablet (20 mEq total) by mouth daily. 30 tablet 3   SODIUM FLUORIDE 5000 SENSITIVE 1.1-5 % GEL SMARTSIG:Sparingly By Mouth Daily     spironolactone  (ALDACTONE ) 25 MG tablet Take 1 tablet (25 mg total) by mouth daily. 30 tablet 3   valsartan  (DIOVAN ) 80 MG tablet Take 1 tablet (80 mg total) by mouth 2 (two) times daily. 60 tablet 5   No current facility-administered medications for this encounter.   BP (!) 144/88   Pulse 76   Ht 5' 7 (1.702 m)   Wt 113.3 kg (249 lb 12.8 oz)   SpO2 98%   BMI 39.12 kg/m    Wt Readings from Last 3 Encounters:  12/04/23 113.3 kg (249 lb 12.8 oz)  06/14/23 112.6 kg (248 lb 3.2 oz)  05/08/23 122 kg (269 lb)   General: NAD Neck: No JVD, no thyromegaly or thyroid  nodule.  Lungs: Clear to auscultation bilaterally with normal respiratory effort. CV: Nondisplaced PMI.  Heart regular S1/S2, no S3/S4, 3/6 early SEM RUSB.  No peripheral edema.  No carotid bruit.  Normal pedal pulses.  Abdomen: Soft, nontender, no hepatosplenomegaly, no distention.  Skin: Intact without lesions or rashes.  Neurologic: Alert  and oriented x 3.  Psych: Normal affect. Extremities: No clubbing or cyanosis.  HEENT: Normal.   Assessment/Plan: 1. Chronic systolic => diastolic CHF: 9/21 echo showed no vegetations but EF was down to 20-25% with severely decreased RV systolic function, moderate MR, moderate TR s/p TV repair, bioprosthetic AoV functioning normally (but there  is evidence of peri-valvular leakage).  RHC in 9/21 showed significantly elevated PCWP and RA pressure, cardiac index was low at 1.6. Cardiac MRI showed nonspecific RV insertion site LGE.  Echo 2/22 EF 40-45% but concerning for severe bioprosthetic stenosis (?bioprosthetic valve thrombosis). Echo 1/25 EF 60-65%, normal RV size/systolic function, trivial MR, s/p TV repair with mild TR, s/p bioprosthetic aortic valve with mean gradient 44 mmHg and AVA 0.75 cm^2, cannot see perivalvular leakage; IVC normal.  Echo today was similar to 1/25, showing EF 60-65% with normal LV size and normal diastolic function, normal RV size and systolic function, trivial MR, s/p TV repair with trivial TR, bioprosthetic aortic valve with mean gradient 45 mmHg and AVA 0.71 cm^2, there is peri-valvular AI that is poorly visualized but at least mild-moderate, no holodiastolic flow reversal in the descending thoracic aorta.  NYHA class I symptoms, she is not volume overloaded on exam.  - Continue Lasix  20 mg daily. BMET and BNP today. - Continue Coreg  3.125 mg bid.  - She is off Entresto  since she lost her insurance.  With hypertension, will start her on valsartan  80 mg bid with BMET in 10 days.  - Continue spironolactone  25 mg daily.  - Restart Farxiga  10 mg daily (should be able to get a grant).  - Avoid pregnancy with HF medications, on Nexplanon. 2. H/o endocarditis: Culture negative.  S/p bioprosthetic aortic valve replacement and TV repair. 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 TEE in 2/22 did not appear to show  active vegetation.  3. Bicuspid aortic valve disorder: Now s/p bioprosthetic AVR.  Thoracic aorta not dilated on 3/21 CT.  4. H/o R CFV DVT: Has IVC filter. This needs to be removed.  She has seen Dr. Luverne who can do this.  - She is working on nature conservation officer, will refer her back to IR when this has been accomplished.  5. H/o CVA: 3/21, suspect septic embolization. Had left MCA revascularization by IR, complicated by left ICA dissection and stent placement.  - Continue ASA 81 mg daily 6. Polysubstance abuse: No drugs or smoking since 3/21. Does consume ETOH. 7. 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  Suspect this is a sequelae of prior endocarditis.  TEE in 9/21 and again in 2/22 showed aortic root to LV peri-valvular leakage with a serpiginous course, peri-valvular leakage appeared no more than moderate in severity.  This is not well visualized on TTEs.  TTE today showed at least mild-moderate peri-valvular AI with no holodiastolic flow reversal in the descending thoracic aorta.  - Have discussed with Dr. Wonda with the structural heart service, will follow for now with serial echoes.  8. Bioprosthetic valve stenosis:  TEE in 2/22 showed EF 45%, mild LV enlargement, normal RV, s/p TV repair with mild TR, bioprosthetic aortic valve with moderate serpiginous peri-valvular leakage from aortic root to LV, mean aortic valve gradient 47 mmHg with DI 0.26 and AVA 0.6 cm^2.  The aortic valve did not show significant degeneration and there was no partial thrombosis noted on the TEE. Suspect that the elevated gradient is due to small valve with patient-prosthesis mismatch as well as high flow from moderate aortic insufficiency. Echo 1/25 similar mean gradient 44 mmHg and AVA 0.75 cm^2, echo today showed mean gradient 45 mmHg with AVA 0.71 cm^2.  The LV EF is normal with NYHA class I symptoms.   - Check BNP  today.  - As long as she is asymptomatic with normal EF and BNP, will continue to  follow.  When she develops symptoms, EF drops, or BNP rises, I think she is going to need redo aortic valve surgery. Will need to follow closely.  - She will need antibiotic prophylaxis with dental work.  - Plan repeat echo in 11/26.  9. Obesity: Body mass index is 39.12 kg/m. - She cannot get semaglutide  right now as she lost her insurance.   Refer to social work for help with setting up health insurance.   Follow up in 2 months with APP.   I spent 41 minutes reviewing records, interviewing/examining patient, and managing orders.   Ezra Shuck 12/08/2023

## 2023-12-11 ENCOUNTER — Telehealth (HOSPITAL_COMMUNITY): Payer: Self-pay | Admitting: Licensed Clinical Social Worker

## 2023-12-11 NOTE — Telephone Encounter (Signed)
 CSW called pt to discuss current loss of Medicaid.  Unable to reach- left VM requesting return call  Lauren HILARIO Leech, LCSW Clinical Social Worker [facility] Desk#: [phone] Cell#: [phone]

## 2023-12-13 ENCOUNTER — Ambulatory Visit (HOSPITAL_COMMUNITY): Payer: Self-pay

## 2023-12-16 NOTE — Telephone Encounter (Signed)
 Patient had to reschedule appointment with case worker until 12/15. Will have more information on or after 12/15

## 2023-12-17 ENCOUNTER — Ambulatory Visit (HOSPITAL_COMMUNITY): Payer: Self-pay

## 2023-12-18 ENCOUNTER — Encounter (HOSPITAL_COMMUNITY): Admitting: Cardiology

## 2023-12-18 ENCOUNTER — Other Ambulatory Visit (HOSPITAL_COMMUNITY)

## 2023-12-19 ENCOUNTER — Telehealth (HOSPITAL_COMMUNITY): Payer: Self-pay | Admitting: Licensed Clinical Social Worker

## 2023-12-19 NOTE — Telephone Encounter (Signed)
 CSW called pt to discuss lack of insurance- unable to reach- left VM requesting return call.  Andriette HILARIO Leech, LCSW Clinical Social Worker [facility] Desk#: [phone] Cell#: [phone]

## 2024-01-06 NOTE — Telephone Encounter (Signed)
 Proof of family planning only faxed to [employer] 01/06/2024.

## 2024-01-15 ENCOUNTER — Other Ambulatory Visit (HOSPITAL_COMMUNITY): Payer: Self-pay

## 2024-01-15 NOTE — Telephone Encounter (Signed)
 Spoke to representative. [employer] is unable to accept the proof of family planning documentation for this application. Discussed with patient, patient expressed understanding, however this patient is still uninsured at the moment.  I have made a note for pts upcoming appointment on 01/26 to see if pt can be changed to Jardiance in which case I will submit application to [facility].

## 2024-01-17 ENCOUNTER — Telehealth (HOSPITAL_COMMUNITY): Payer: Self-pay | Admitting: Licensed Clinical Social Worker

## 2024-01-17 NOTE — Telephone Encounter (Signed)
 H&V Care Navigation CSW Progress Note  Clinical Social Worker spoke with pt regarding loss of Medicaid.  Patient states that once she started her job she became over income and no longer qualified.    Patient getting meds through HF fund so has no issues getting medications at this time.  Does struggle with overdue bills- spoke with pt about [employer]- sent application by mail and email.  Pt has looked into private insurance but unable to afford at this time.  CSW will plan to meet with her in clinic to review CAFA and help with submission.  Andriette HILARIO Leech, LCSW Clinical Social Worker [facility] Desk#: [phone] Cell#: [phone]

## 2024-01-31 ENCOUNTER — Telehealth (HOSPITAL_COMMUNITY): Payer: Self-pay | Admitting: Family Medicine

## 2024-02-03 ENCOUNTER — Ambulatory Visit (HOSPITAL_COMMUNITY): Payer: Self-pay

## 2024-02-06 ENCOUNTER — Telehealth (HOSPITAL_COMMUNITY): Payer: Self-pay

## 2024-02-06 NOTE — Telephone Encounter (Signed)
 Called to confirm/remind patient of their appointment at the [facility] on 02/07/24.   Appointment:   [x] Confirmed  [] Left mess   [] No answer/No voice mail  [] VM Full/unable to leave message  [] Phone not in service  Patient reminded to bring all medications and/or complete list.  Confirmed patient has transportation. Gave directions, instructed to utilize valet parking.

## 2024-02-07 ENCOUNTER — Other Ambulatory Visit (HOSPITAL_COMMUNITY): Payer: Self-pay

## 2024-02-07 ENCOUNTER — Ambulatory Visit (HOSPITAL_COMMUNITY)
Admission: RE | Admit: 2024-02-07 | Discharge: 2024-02-07 | Disposition: A | Payer: Self-pay | Source: Ambulatory Visit | Attending: Family Medicine

## 2024-02-07 ENCOUNTER — Encounter (HOSPITAL_COMMUNITY): Payer: Self-pay

## 2024-02-07 ENCOUNTER — Other Ambulatory Visit: Payer: Self-pay

## 2024-02-07 VITALS — BP 132/84 | HR 86 | Wt 257.4 lb

## 2024-02-07 DIAGNOSIS — E669 Obesity, unspecified: Secondary | ICD-10-CM | POA: Insufficient documentation

## 2024-02-07 DIAGNOSIS — Z7982 Long term (current) use of aspirin: Secondary | ICD-10-CM | POA: Insufficient documentation

## 2024-02-07 DIAGNOSIS — Z86718 Personal history of other venous thrombosis and embolism: Secondary | ICD-10-CM | POA: Insufficient documentation

## 2024-02-07 DIAGNOSIS — Z79899 Other long term (current) drug therapy: Secondary | ICD-10-CM | POA: Insufficient documentation

## 2024-02-07 DIAGNOSIS — Z8673 Personal history of transient ischemic attack (TIA), and cerebral infarction without residual deficits: Secondary | ICD-10-CM | POA: Insufficient documentation

## 2024-02-07 DIAGNOSIS — Z6841 Body Mass Index (BMI) 40.0 and over, adult: Secondary | ICD-10-CM | POA: Insufficient documentation

## 2024-02-07 DIAGNOSIS — Z7984 Long term (current) use of oral hypoglycemic drugs: Secondary | ICD-10-CM | POA: Insufficient documentation

## 2024-02-07 DIAGNOSIS — Z952 Presence of prosthetic heart valve: Secondary | ICD-10-CM

## 2024-02-07 DIAGNOSIS — Z139 Encounter for screening, unspecified: Secondary | ICD-10-CM

## 2024-02-07 DIAGNOSIS — I38 Endocarditis, valve unspecified: Secondary | ICD-10-CM

## 2024-02-07 DIAGNOSIS — F1911 Other psychoactive substance abuse, in remission: Secondary | ICD-10-CM | POA: Insufficient documentation

## 2024-02-07 DIAGNOSIS — Z5971 Insufficient health insurance coverage: Secondary | ICD-10-CM | POA: Insufficient documentation

## 2024-02-07 DIAGNOSIS — Z87891 Personal history of nicotine dependence: Secondary | ICD-10-CM | POA: Insufficient documentation

## 2024-02-07 DIAGNOSIS — I5022 Chronic systolic (congestive) heart failure: Secondary | ICD-10-CM | POA: Insufficient documentation

## 2024-02-07 DIAGNOSIS — Z8679 Personal history of other diseases of the circulatory system: Secondary | ICD-10-CM

## 2024-02-07 DIAGNOSIS — Z953 Presence of xenogenic heart valve: Secondary | ICD-10-CM | POA: Insufficient documentation

## 2024-02-07 DIAGNOSIS — Z95828 Presence of other vascular implants and grafts: Secondary | ICD-10-CM | POA: Insufficient documentation

## 2024-02-07 DIAGNOSIS — I351 Nonrheumatic aortic (valve) insufficiency: Secondary | ICD-10-CM | POA: Insufficient documentation

## 2024-02-07 MED ORDER — POTASSIUM CHLORIDE CRYS ER 20 MEQ PO TBCR
20.0000 meq | EXTENDED_RELEASE_TABLET | ORAL | 3 refills | Status: AC | PRN
  Filled 2024-02-07: qty 30, fill #0
  Filled 2024-02-07: qty 30, 30d supply, fill #0

## 2024-02-07 MED ORDER — VALSARTAN 80 MG PO TABS
80.0000 mg | ORAL_TABLET | Freq: Two times a day (BID) | ORAL | 3 refills | Status: AC
  Filled 2024-02-07: qty 180, 90d supply, fill #0
  Filled 2024-02-07: qty 60, 30d supply, fill #0

## 2024-02-07 MED ORDER — SPIRONOLACTONE 25 MG PO TABS
25.0000 mg | ORAL_TABLET | Freq: Every day | ORAL | 3 refills | Status: AC
  Filled 2024-02-07: qty 30, 30d supply, fill #0
  Filled 2024-02-07: qty 90, 90d supply, fill #0

## 2024-02-07 MED ORDER — JARDIANCE 10 MG PO TABS: 10.0000 mg | ORAL_TABLET | Freq: Every day | ORAL | Status: AC

## 2024-02-07 MED ORDER — CARVEDILOL 3.125 MG PO TABS
3.1250 mg | ORAL_TABLET | Freq: Two times a day (BID) | ORAL | 3 refills | Status: AC
  Filled 2024-02-07: qty 60, 30d supply, fill #0
  Filled 2024-02-07: qty 180, 90d supply, fill #0

## 2024-02-07 MED ORDER — FUROSEMIDE 20 MG PO TABS
20.0000 mg | ORAL_TABLET | ORAL | 3 refills | Status: AC | PRN
  Filled 2024-02-07: qty 30, fill #0
  Filled 2024-02-07: qty 30, 30d supply, fill #0

## 2024-02-07 NOTE — Addendum Note (Signed)
 Encounter addended by: Marcelina Lisa HERO, RN on: 02/07/2024 9:25 AM  Actions taken: Order list changed

## 2024-02-07 NOTE — Progress Notes (Signed)
 Medication Samples have been provided to the patient.  Drug name: Jardiance        Strength: 10mg         Qty: 4 bottles  LOT: [id number]  Exp.Date: 04-07-25  Dosing instructions: take 1 tablet daily.  The patient has been instructed regarding the correct time, dose, and frequency of taking this medication, including desired effects and most common side effects.   Lisa HERO Ger Nicks 9:21 AM 02/07/2024

## 2024-02-07 NOTE — Addendum Note (Signed)
 Encounter addended by: Cathern Andriette DEL, LCSW on: 02/07/2024 10:15 AM  Actions taken: Clinical Note Signed

## 2024-05-07 ENCOUNTER — Ambulatory Visit (HOSPITAL_COMMUNITY): Payer: Self-pay | Admitting: Cardiology
